# Patient Record
Sex: Male | Born: 1950 | Race: White | Hispanic: No | Marital: Single | Smoking: Never smoker
Health system: Southern US, Community
[De-identification: ages and names within clinical notes are randomized; demographics above are authoritative.]

## PROBLEM LIST (undated history)

## (undated) DIAGNOSIS — C641 Malignant neoplasm of right kidney, except renal pelvis: Secondary | ICD-10-CM

## (undated) DIAGNOSIS — M109 Gout, unspecified: Secondary | ICD-10-CM

## (undated) DIAGNOSIS — C801 Malignant (primary) neoplasm, unspecified: Secondary | ICD-10-CM

## (undated) DIAGNOSIS — E7800 Pure hypercholesterolemia, unspecified: Secondary | ICD-10-CM

## (undated) DIAGNOSIS — G243 Spasmodic torticollis: Secondary | ICD-10-CM

## (undated) DIAGNOSIS — E119 Type 2 diabetes mellitus without complications: Secondary | ICD-10-CM

## (undated) HISTORY — DX: Malignant (primary) neoplasm, unspecified: C80.1

## (undated) HISTORY — PX: BONE MARROW BIOPSY: SHX199

---

## 1996-10-29 HISTORY — PX: NEPHRECTOMY: SHX65

## 1999-12-22 ENCOUNTER — Encounter: Payer: Self-pay | Admitting: Urology

## 1999-12-22 ENCOUNTER — Encounter: Admission: RE | Admit: 1999-12-22 | Discharge: 1999-12-22 | Payer: Self-pay | Admitting: Urology

## 2000-06-12 ENCOUNTER — Encounter: Payer: Self-pay | Admitting: Urology

## 2000-06-12 ENCOUNTER — Encounter: Admission: RE | Admit: 2000-06-12 | Discharge: 2000-06-12 | Payer: Self-pay | Admitting: Urology

## 2001-02-10 ENCOUNTER — Ambulatory Visit (HOSPITAL_COMMUNITY): Admission: RE | Admit: 2001-02-10 | Discharge: 2001-02-10 | Payer: Self-pay | Admitting: Internal Medicine

## 2001-07-09 ENCOUNTER — Encounter: Payer: Self-pay | Admitting: Urology

## 2001-07-09 ENCOUNTER — Encounter: Admission: RE | Admit: 2001-07-09 | Discharge: 2001-07-09 | Payer: Self-pay | Admitting: Urology

## 2002-07-17 ENCOUNTER — Encounter: Payer: Self-pay | Admitting: Urology

## 2002-07-17 ENCOUNTER — Encounter: Admission: RE | Admit: 2002-07-17 | Discharge: 2002-07-17 | Payer: Self-pay | Admitting: Urology

## 2004-02-24 ENCOUNTER — Ambulatory Visit (HOSPITAL_COMMUNITY): Admission: RE | Admit: 2004-02-24 | Discharge: 2004-02-24 | Payer: Self-pay | Admitting: Urology

## 2004-12-26 ENCOUNTER — Ambulatory Visit (HOSPITAL_COMMUNITY): Admission: RE | Admit: 2004-12-26 | Discharge: 2004-12-26 | Payer: Self-pay | Admitting: Internal Medicine

## 2005-01-08 ENCOUNTER — Ambulatory Visit (HOSPITAL_COMMUNITY): Admission: RE | Admit: 2005-01-08 | Discharge: 2005-01-08 | Payer: Self-pay | Admitting: General Surgery

## 2005-10-29 HISTORY — PX: CHOLECYSTECTOMY: SHX55

## 2006-04-04 ENCOUNTER — Ambulatory Visit (HOSPITAL_COMMUNITY): Admission: RE | Admit: 2006-04-04 | Discharge: 2006-04-04 | Payer: Self-pay | Admitting: Internal Medicine

## 2006-04-16 ENCOUNTER — Encounter (HOSPITAL_COMMUNITY): Admission: RE | Admit: 2006-04-16 | Discharge: 2006-05-16 | Payer: Self-pay | Admitting: Internal Medicine

## 2006-05-07 ENCOUNTER — Ambulatory Visit (HOSPITAL_COMMUNITY): Admission: RE | Admit: 2006-05-07 | Discharge: 2006-05-07 | Payer: Self-pay | Admitting: Internal Medicine

## 2006-05-23 ENCOUNTER — Encounter (HOSPITAL_COMMUNITY): Admission: RE | Admit: 2006-05-23 | Discharge: 2006-06-22 | Payer: Self-pay | Admitting: Neurosurgery

## 2008-12-30 ENCOUNTER — Encounter: Admission: RE | Admit: 2008-12-30 | Discharge: 2008-12-30 | Payer: Self-pay | Admitting: Occupational Medicine

## 2010-11-10 ENCOUNTER — Encounter (INDEPENDENT_AMBULATORY_CARE_PROVIDER_SITE_OTHER): Payer: Self-pay

## 2010-11-13 ENCOUNTER — Ambulatory Visit: Admit: 2010-11-13 | Payer: Self-pay | Admitting: Gastroenterology

## 2010-11-27 ENCOUNTER — Encounter: Payer: Self-pay | Admitting: Internal Medicine

## 2010-11-30 NOTE — Letter (Signed)
Summary: Recall, Screening Colonoscopy Only  [facility]  [address]   [place], [place] [address]   Phone: [phone]  Fax: [phone]    November 10, 2010  Tom Marshall [address] [place], [place]  [address] April 18, 1951   Dear [name],   Our records indicate it is time to schedule your colonoscopy.    Please call our office at [phone] and ask for the nurse.   Thank you,  Burnadette Peter, LPN [place] Merl, [facility] Ph: [phone]   Fax: [phone]

## 2010-12-06 ENCOUNTER — Encounter: Payer: Self-pay | Admitting: Internal Medicine

## 2010-12-06 ENCOUNTER — Ambulatory Visit (HOSPITAL_COMMUNITY)
Admission: RE | Admit: 2010-12-06 | Discharge: 2010-12-06 | Disposition: A | Discharge status: Home / Self Care | Payer: 59 | Source: Ambulatory Visit | Attending: Internal Medicine | Admitting: Internal Medicine

## 2010-12-06 DIAGNOSIS — Z09 Encounter for follow-up examination after completed treatment for conditions other than malignant neoplasm: Secondary | ICD-10-CM

## 2010-12-06 DIAGNOSIS — Z860100 Personal history of colon polyps, unspecified: Secondary | ICD-10-CM | POA: Insufficient documentation

## 2010-12-06 DIAGNOSIS — E785 Hyperlipidemia, unspecified: Secondary | ICD-10-CM | POA: Insufficient documentation

## 2010-12-06 DIAGNOSIS — Z79899 Other long term (current) drug therapy: Secondary | ICD-10-CM | POA: Insufficient documentation

## 2010-12-06 DIAGNOSIS — E119 Type 2 diabetes mellitus without complications: Secondary | ICD-10-CM | POA: Insufficient documentation

## 2010-12-06 DIAGNOSIS — Z1211 Encounter for screening for malignant neoplasm of colon: Secondary | ICD-10-CM | POA: Insufficient documentation

## 2010-12-06 LAB — GLUCOSE, CAPILLARY: Glucose-Capillary: 149 mg/dL — ABNORMAL HIGH (ref 70–99)

## 2010-12-06 NOTE — Letter (Signed)
Summary: TRIAGE ORDER  TRIAGE ORDER   Imported By: Sofie Rower 11/27/2010 15:36:40  _____________________________________________________________________  External Attachment:    Type:   Image     Comment:   External Document

## 2010-12-19 NOTE — Op Note (Signed)
  NAME:  Tom Marshall, Tom Marshall NO.:  [id number]  MEDICAL RECORD NO.:  [id number]           PATIENT TYPE:  O  LOCATION:  DAYP                          FACILITY:  [facility]  PHYSICIAN:  R. Garfield Cornea, M.D. DATE OF BIRTH:  [birth year]  DATE OF PROCEDURE:  12/06/2010 DATE OF DISCHARGE:                              OPERATIVE REPORT   INDICATIONS FOR PROCEDURE:  A 60 year old gentleman comes for screening colonoscopy.  His last colonoscopy was in 2002.  That was done by me. He was found to have a hyperplastic polyp at that time.  No family history of colon polyps or colon cancer.  No GI symptoms currently. Colonoscopy is now being done as standard screening maneuver.  Risks, benefits, limitations, alternatives, and imponderables have been discussed, questions answered.  Please see the documentation in the medical record.  PROCEDURE NOTE:  O2 saturation, blood pressure, pulse, and respirations were monitored throughout the entire procedure.  CONSCIOUS SEDATION:  Versed 4 mg IV, Demerol 75 mg IV in divided doses.  INSTRUMENT:  Pentax video chip system.  FINDINGS:  Digital rectal exam revealed no abnormalities.  Endoscopic findings:  Prep was adequate.  Colon:  Colonic mucosa was surveyed from the rectosigmoid junction through the left, transverse, right colon to the appendiceal orifice, ileocecal valve/cecum.  These structures were well seen and photographed for the record.  From this level, scope was slowly and cautiously withdrawn.  All previously seen mucosal surfaces were once again surveyed.  The patient had somewhat of a long tortuous colon, otherwise colonic mucosa appeared normal.  Scope was pulled down to the rectum where thorough examination of the rectal mucosa including retroflexed view of the anal verge demonstrated only anal papilla.  The patient tolerated the procedure well.  Cecal withdrawal time 10 minutes.  IMPRESSION:  Anal papilla, otherwise normal  rectum.  Long, somewhat redundant but otherwise normal-appearing colon.  RECOMMENDATIONS:  Repeat screening colonoscopy in 10 years.     Bridgette Habermann, M.D.     RMR/MEDQ  D:  12/06/2010  T:  12/06/2010  Job:  [id number]  cc:   Paula Compton. Willey Blade, MD Fax: [phone]  Electronically Signed by Jannette Spanner M.D. on 12/19/2010 02:33:11 PM

## 2011-03-16 NOTE — H&P (Signed)
NAME:  Tom Marshall, Tom Marshall NO.:  [id number]   MEDICAL RECORD NO.:  [id number]          PATIENT TYPE:  AMB   LOCATION:                                 FACILITY:   PHYSICIAN:  Jamesetta So, M.D.  DATE OF BIRTH:  September 13, 1951   DATE OF ADMISSION:  01/08/2005  DATE OF DISCHARGE:  LH                                HISTORY & PHYSICAL   CHIEF COMPLAINT:  1.  Biliary colic.  2.  Cholelithiasis.   HISTORY OF PRESENT ILLNESS:  The patient is a 61 year old white male who is  referred for evaluation and treatment of biliary colic secondary to  cholelithiasis.  He has been having intermittent episodes of right upper  quadrant abdominal pain with radiation to the right flank, nausea and  bloating for several weeks.  No fatty food intolerance is noted.  No fever,  chills or jaundice have been noted.   PAST MEDICAL HISTORY:  Gout.   PAST SURGICAL HISTORY:  Right nephrectomy for cancer.   CURRENT MEDICATIONS:  Allopurinol 300 mg p.o. daily.   ALLERGIES:  No known drug allergies.   REVIEW OF SYSTEMS:  Noncontributory.   PHYSICAL EXAMINATION:  GENERAL APPEARANCE:  The patient is a well-developed,  well-nourished, white male in no acute distress.  VITAL SIGNS:  He is afebrile and vital signs are stable.  HEENT:  No scleral icterus.  LUNGS:  Clear to auscultation with equal breath sounds bilaterally.  HEART:  Regular rate and rhythm without S3, S4 or murmurs.  ABDOMEN:  Soft with slight tenderness down the right upper quadrant to  palpation.  A large right subcostal surgical scar is noted.  No  hepatosplenomegaly, masses or hernias are present.   LABORATORY DATA:  Ultrasound of the gallbladder reveals a single stone  within the gallbladder lumen.  A normal common bile duct is noted.   IMPRESSION:  1.  Biliary colic.  2.  Cholelithiasis.   PLAN:  The patient is scheduled for laparoscopic cholecystectomy on January 08, 2005.  The risks and benefits of the procedure,  including bleeding,  infection, hepatobiliary injury and the possibility of an open procedure  were fully explained to the patient, who gave informed consent.      MAJ/MEDQ  D:  01/04/2005  T:  01/04/2005  Job:  [id number]   cc:   [name]  Fax: [place] Willey Blade, [place]  [place] [address]  Fax: [phone]

## 2011-03-16 NOTE — Op Note (Signed)
NAME:  Tom Marshall, Tom Marshall NO.:  [id number]   MEDICAL RECORD NO.:  [id number]          PATIENT TYPE:  AMB   LOCATION:  DAY                           FACILITY:  [facility]   PHYSICIAN:  Jamesetta So, M.D.  DATE OF BIRTH:  [birth year]   DATE OF PROCEDURE:  01/08/2005  DATE OF DISCHARGE:                                 OPERATIVE REPORT   PREOPERATIVE DIAGNOSIS:  Cholecystitis, cholelithiasis.   POSTOPERATIVE DIAGNOSIS:  Cholecystitis, cholelithiasis.   PROCEDURE:  Laparoscopic cholecystectomy.   SURGEON:  Jamesetta So, M.D.   ASSISTANT:  Reita Cliche. Marnette Burgess, M.D.   ANESTHESIA:  General endotracheal.   INDICATIONS:  The patient is a 60 year old white male status post a right  nephrectomy in the past, who now presents with cholecystitis secondary to  cholelithiasis.  The risks and benefits of the procedure, including  bleeding, infection, hepatobiliary, and the possibility of an open procedure  were fully explained to the patient, who gave informed consent.   PROCEDURE NOTE:  The patient was placed in the supine position.  After  induction of general endotracheal anesthesia, the abdomen was prepped and  draped using the usual sterile technique with Betadine.  Surgical site  confirmation was performed.   A supraumbilical incision was made down to the fascia.  A Veress needle was  introduced into the abdominal cavity and confirmation was performed using  the saline drop test.  The abdomen was then insufflated to 16 mmHg pressure.  An 11 mm trocar was introduced into the abdominal cavity under direct  visualization without difficulty. An additional 11-mm trocar was placed in  the epigastric region and 5-mm trocars placed in the right upper quadrant  and right flank regions.  Significant amount of adhesions of omentum were  noted to the right subcostal incision.  These were freed away from the  abdominal wall using the harmonic scalpel.  The liver was then inspected  and  noted to normal limits.  The gallbladder was retracted superior and laterally.  The dissection was  begun from the top of the gallbladder down to the infundibulum.  The  infundibulum was fully visualized.  The cystic duct was first fully  visualized.  The juncture of the cystic duct with the infundibulum was fully  visualized. Endoclips were placed proximally and distally on the cystic duct  and the cystic duct was divided.  This was likewise done to the cystic  artery.  The gallbladder then freed away from the gallbladder fossa using  Bovie electrocautery.  The gallbladder was delivered through the epigastric  trocar site using EndoCatch bag.  The gallbladder fossa was inspected and no  abnormal bleeding or bile leakage was noted.  Surgicel was placed in the  gallbladder fossa.  All fluid and air were then evacuated from the abdominal  cavity prior to removal of the trocars.   All wounds were irrigated with normal saline.  All wounds were injected with  0.5% Sensorcaine.  The supraumbilical fascia as well as epigastric fascia  were reapproximated using 0 Vicryl interrupted sutures.  All skin incisions  were closed using  staples.  Betadine ointment and dry sterile dressings were  applied.   All tape and needle counts were correct and the procedure. The patient was  extubated in the operating room went back to the recovery room awake in  stable condition.   COMPLICATIONS:  None.   SPECIMEN:  Gallbladder stones.   BLOOD LOSS:  Minimal.      MAJ/MEDQ  D:  01/08/2005  T:  01/08/2005  Job:  [id number]   cc:   Paula Compton. Willey Blade, [place]  [place] [address]  Fax: [phone]

## 2015-07-18 DIAGNOSIS — Z125 Encounter for screening for malignant neoplasm of prostate: Secondary | ICD-10-CM | POA: Diagnosis not present

## 2015-07-18 DIAGNOSIS — I10 Essential (primary) hypertension: Secondary | ICD-10-CM | POA: Diagnosis not present

## 2015-07-18 DIAGNOSIS — E119 Type 2 diabetes mellitus without complications: Secondary | ICD-10-CM | POA: Diagnosis not present

## 2015-07-18 DIAGNOSIS — Z79899 Other long term (current) drug therapy: Secondary | ICD-10-CM | POA: Diagnosis not present

## 2015-07-18 DIAGNOSIS — M109 Gout, unspecified: Secondary | ICD-10-CM | POA: Diagnosis not present

## 2015-07-25 DIAGNOSIS — E1129 Type 2 diabetes mellitus with other diabetic kidney complication: Secondary | ICD-10-CM | POA: Diagnosis not present

## 2015-07-25 DIAGNOSIS — Z6837 Body mass index (BMI) 37.0-37.9, adult: Secondary | ICD-10-CM | POA: Diagnosis not present

## 2015-07-25 DIAGNOSIS — Z23 Encounter for immunization: Secondary | ICD-10-CM | POA: Diagnosis not present

## 2015-07-25 DIAGNOSIS — E785 Hyperlipidemia, unspecified: Secondary | ICD-10-CM | POA: Diagnosis not present

## 2015-07-25 DIAGNOSIS — M109 Gout, unspecified: Secondary | ICD-10-CM | POA: Diagnosis not present

## 2015-11-15 DIAGNOSIS — E119 Type 2 diabetes mellitus without complications: Secondary | ICD-10-CM | POA: Diagnosis not present

## 2015-11-21 DIAGNOSIS — Z23 Encounter for immunization: Secondary | ICD-10-CM | POA: Diagnosis not present

## 2015-11-21 DIAGNOSIS — Z6837 Body mass index (BMI) 37.0-37.9, adult: Secondary | ICD-10-CM | POA: Diagnosis not present

## 2015-11-21 DIAGNOSIS — E1129 Type 2 diabetes mellitus with other diabetic kidney complication: Secondary | ICD-10-CM | POA: Diagnosis not present

## 2015-11-21 DIAGNOSIS — I10 Essential (primary) hypertension: Secondary | ICD-10-CM | POA: Diagnosis not present

## 2016-04-16 DIAGNOSIS — Z79899 Other long term (current) drug therapy: Secondary | ICD-10-CM | POA: Diagnosis not present

## 2016-04-16 DIAGNOSIS — E785 Hyperlipidemia, unspecified: Secondary | ICD-10-CM | POA: Diagnosis not present

## 2016-04-16 DIAGNOSIS — I10 Essential (primary) hypertension: Secondary | ICD-10-CM | POA: Diagnosis not present

## 2016-04-16 DIAGNOSIS — Z125 Encounter for screening for malignant neoplasm of prostate: Secondary | ICD-10-CM | POA: Diagnosis not present

## 2016-04-16 DIAGNOSIS — E119 Type 2 diabetes mellitus without complications: Secondary | ICD-10-CM | POA: Diagnosis not present

## 2016-04-20 DIAGNOSIS — E785 Hyperlipidemia, unspecified: Secondary | ICD-10-CM | POA: Diagnosis not present

## 2016-04-20 DIAGNOSIS — Z6837 Body mass index (BMI) 37.0-37.9, adult: Secondary | ICD-10-CM | POA: Diagnosis not present

## 2016-04-20 DIAGNOSIS — E1129 Type 2 diabetes mellitus with other diabetic kidney complication: Secondary | ICD-10-CM | POA: Diagnosis not present

## 2016-04-20 DIAGNOSIS — I10 Essential (primary) hypertension: Secondary | ICD-10-CM | POA: Diagnosis not present

## 2016-08-28 DIAGNOSIS — E119 Type 2 diabetes mellitus without complications: Secondary | ICD-10-CM | POA: Diagnosis not present

## 2016-09-04 DIAGNOSIS — I10 Essential (primary) hypertension: Secondary | ICD-10-CM | POA: Diagnosis not present

## 2016-09-04 DIAGNOSIS — E1129 Type 2 diabetes mellitus with other diabetic kidney complication: Secondary | ICD-10-CM | POA: Diagnosis not present

## 2017-01-02 DIAGNOSIS — E119 Type 2 diabetes mellitus without complications: Secondary | ICD-10-CM | POA: Diagnosis not present

## 2017-01-10 DIAGNOSIS — Z6837 Body mass index (BMI) 37.0-37.9, adult: Secondary | ICD-10-CM | POA: Diagnosis not present

## 2017-01-10 DIAGNOSIS — Z23 Encounter for immunization: Secondary | ICD-10-CM | POA: Diagnosis not present

## 2017-01-10 DIAGNOSIS — I10 Essential (primary) hypertension: Secondary | ICD-10-CM | POA: Diagnosis not present

## 2017-01-10 DIAGNOSIS — E1129 Type 2 diabetes mellitus with other diabetic kidney complication: Secondary | ICD-10-CM | POA: Diagnosis not present

## 2017-03-06 DIAGNOSIS — E119 Type 2 diabetes mellitus without complications: Secondary | ICD-10-CM | POA: Diagnosis not present

## 2017-05-02 DIAGNOSIS — M109 Gout, unspecified: Secondary | ICD-10-CM | POA: Diagnosis not present

## 2017-05-02 DIAGNOSIS — E119 Type 2 diabetes mellitus without complications: Secondary | ICD-10-CM | POA: Diagnosis not present

## 2017-05-02 DIAGNOSIS — C649 Malignant neoplasm of unspecified kidney, except renal pelvis: Secondary | ICD-10-CM | POA: Diagnosis not present

## 2017-05-02 DIAGNOSIS — E785 Hyperlipidemia, unspecified: Secondary | ICD-10-CM | POA: Diagnosis not present

## 2017-05-02 DIAGNOSIS — Z125 Encounter for screening for malignant neoplasm of prostate: Secondary | ICD-10-CM | POA: Diagnosis not present

## 2017-05-02 DIAGNOSIS — I10 Essential (primary) hypertension: Secondary | ICD-10-CM | POA: Diagnosis not present

## 2017-05-02 DIAGNOSIS — Z79899 Other long term (current) drug therapy: Secondary | ICD-10-CM | POA: Diagnosis not present

## 2017-05-06 DIAGNOSIS — I10 Essential (primary) hypertension: Secondary | ICD-10-CM | POA: Diagnosis not present

## 2017-05-06 DIAGNOSIS — E785 Hyperlipidemia, unspecified: Secondary | ICD-10-CM | POA: Diagnosis not present

## 2017-05-06 DIAGNOSIS — E1129 Type 2 diabetes mellitus with other diabetic kidney complication: Secondary | ICD-10-CM | POA: Diagnosis not present

## 2017-05-06 DIAGNOSIS — Z6836 Body mass index (BMI) 36.0-36.9, adult: Secondary | ICD-10-CM | POA: Diagnosis not present

## 2017-06-03 DIAGNOSIS — M542 Cervicalgia: Secondary | ICD-10-CM | POA: Diagnosis not present

## 2017-06-13 ENCOUNTER — Ambulatory Visit (HOSPITAL_COMMUNITY): Payer: Medicare Other | Attending: Internal Medicine | Admitting: Physical Therapy

## 2017-06-13 ENCOUNTER — Encounter (HOSPITAL_COMMUNITY): Payer: Self-pay | Admitting: Physical Therapy

## 2017-06-13 DIAGNOSIS — R293 Abnormal posture: Secondary | ICD-10-CM | POA: Insufficient documentation

## 2017-06-13 DIAGNOSIS — M5412 Radiculopathy, cervical region: Secondary | ICD-10-CM | POA: Diagnosis not present

## 2017-06-13 DIAGNOSIS — M62830 Muscle spasm of back: Secondary | ICD-10-CM

## 2017-06-13 NOTE — Therapy (Signed)
[place], [place], [address] Phone: [phone]   Fax:  [phone]  Physical Therapy Evaluation  Patient Details  Name: Tom Marshall MRN: [id number] Date of Birth: Aug 09, 1951 Referring Provider: Asencion Noble, MD   Encounter Date: 06/13/2017      PT End of Session - 06/13/17 1433    Visit Number 1   Number of Visits 9   Date for PT Re-Evaluation 07/11/17   Authorization Type Medicare   Authorization Time Period 06/13/17 to 07/14/17   PT Start Time 1350   PT Stop Time 1428   PT Time Calculation (min) 38 min   Activity Tolerance Patient tolerated treatment well;No increased pain   Behavior During Therapy [facility] for tasks assessed/performed      History reviewed. No pertinent past medical history.  History reviewed. No pertinent surgical history.  There were no vitals filed for this visit.       Subjective Assessment - 06/13/17 1351    Subjective Pt reports having neck pain onset about 3 weeks ago when he was moving some heavy objects throughout the day. He went to see Dr. Willey Blade who referred him to PT and gave him pain medication. He noted his pain initially in the Lt side of neck and down the arm, but this has slowly improved and now is only on the Rt side from his forearm up towards the neck.    Pertinent History DM2, HLD   Limitations Other (comment)  getting up and turning his head around    How long can you sit comfortably? unlimited    How long can you stand comfortably? unlimited    How long can you walk comfortably? unsure, pt stopped doing this once his neck pain started    Diagnostic tests none recently    Patient Stated Goals improve pain    Currently in Pain? No/denies            [facility] PT Assessment - 06/13/17 0001      Assessment   Medical Diagnosis Neck pain   Referring Provider Asencion Noble, MD    Onset Date/Surgical Date --  ~3 weeks ago   Hand Dominance Left   Next MD Visit 06/17/17    Prior Therapy pt reports having PT and traction ~10 years ago which improved his symptoms.      Balance Screen   Has the patient fallen in the past 6 months No   Has the patient had a decrease in activity level because of a fear of falling?  No   Is the patient reluctant to leave their home because of a fear of falling?  No     Home Ecologist residence     Prior Function   Level of Independence Independent     Cognition   Overall Cognitive Status Within Functional Limits for tasks assessed     Observation/Other Assessments   Focus on Therapeutic Outcomes (FOTO)  58% limited     Sensation   Light Touch Appears Intact     Posture/Postural Control   Posture Comments Sitting slouched in his chair, slight Rt cervical tilt, excessive forward head and rounded shoulders      ROM / Strength   AROM / PROM / Strength AROM;Strength     AROM   Overall AROM Comments PROM WNL and pain free   AROM Assessment Site Cervical   Cervical Flexion full    Cervical Extension 20  Cervical - Right Side Bend 20  Tightness on the Rt    Cervical - Left Side Bend 40   Cervical - Right Rotation 65  pain Rt shoulder    Cervical - Left Rotation 50  Pt unable to complete with Rt lateral tilt      Strength   Strength Assessment Site Shoulder;Elbow   Right/Left Shoulder Right;Left   Right Shoulder Flexion 5/5   Right Shoulder ABduction 4/5  (+) pain    Right Shoulder Internal Rotation 5/5   Right Shoulder External Rotation 5/5   Left Shoulder Flexion 5/5   Left Shoulder ABduction 5/5   Left Shoulder Internal Rotation 4+/5   Left Shoulder External Rotation 5/5   Right/Left Elbow Right;Left   Right Elbow Flexion 5/5   Right Elbow Extension 5/5   Left Elbow Flexion 5/5   Left Elbow Extension 5/5     Palpation   Palpation comment Rt upper trap, Rt sub-occipital musculature            Objective measurements completed on examination: See above findings.           [place] Adult PT Treatment/Exercise - 06/13/17 0001      Exercises   Exercises Neck     Neck Exercises: Supine   Neck Retraction 10 reps   Neck Retraction Limitations 3 sec hold, heavy cuing initially      Neck Exercises: Stretches   Other Neck Stretches cervical rotation stretch 1x10 sec each with towel, Pt unable to complete correctly despite heavy cueing                 PT Education - 06/13/17 1431    Education provided Yes   Education Details eval findings/POC; implemented and reviewed HEP   Person(s) Educated Patient   Methods Explanation;Verbal cues;Tactile cues;Handout   Comprehension Verbalized understanding;Returned demonstration          PT Short Term Goals - 06/13/17 1515      PT SHORT TERM GOAL #1   Title Pt will demo consistency and independence with her HEP to improve cervical ROM and pain    Time 2   Period Weeks   Status New   Target Date 06/27/17           PT Long Term Goals - 06/13/17 1516      PT LONG TERM GOAL #1   Title Pt will demo proper log roll mechanics during his session, without cuing from therapist, to decrease strain to his neck musculature.    Time 4   Status New   Target Date 07/11/17     PT LONG TERM GOAL #2   Title Pt will demo improved cervical AROM to within 5 deg of the Lt and Rt, to improve his ability to turn his head while driving without pain.    Time 4   Period Weeks   Status New     PT LONG TERM GOAL #3   Title Pt will demo improved cervical mobility and deep neck flexor strength evident by his ability to complete upright chin tucks, x10 reps, without cuing from therapist.    Time 4   Period Weeks   Status New     PT LONG TERM GOAL #4   Title Pt will report atleast 50% improvement in his symptoms from the start of PT, to increase his activity tolerance and quality of life.    Time 4   Period Weeks   Status New  Plan - June 30, 2017 1438    Clinical Impression Statement  Pt is a 66 y.o M referred to OPPT with complaints of neck pain onset ~3 weeks ago after doing alot of lifting around his home. Symptoms began initially on the Lt, however pt noted complains of symptoms on the Rt of his neck and down to the elbow. Pt had difficulty describing the nature and intensity of his symptoms, but he does demonstrate limitations in active cervical ROM, decreased strength in the RUE, poor postural awareness, and increased muscle spasm along the Rt upper trap/sub-occipital region. He denies any numbness or tingling at this time and has reportedly had good results with traction treatment in the past. He would benefit from skilled PT intervention to address his impairments listed above, to improve his daily participation and quality of life. HEP was implemented and reviewed, with pt needing heavy cues for proper technique. He verbalized agreement with the proposed PT POC and frequency.    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   Clinical Presentation Stable   Clinical Presentation due to: ROM, strength and activity tolerance   Clinical Decision Making Low   Rehab Potential Good   PT Frequency 2x / week   PT Duration 4 weeks   PT Treatment/Interventions ADLs/Self Care Home Management   PT Next Visit Plan STM, gentle cervical CPAs; supine chin tucks and cervical AROM; educate pt on proper log roll technique   PT Home Exercise Plan supine cervical rotation, supine chin tucks    Consulted and Agree with Plan of Care Patient      Patient will benefit from skilled therapeutic intervention in order to improve the following deficits and impairments:  Decreased activity tolerance, Decreased range of motion, Impaired flexibility, Hypomobility, Decreased strength, Increased muscle spasms, Postural dysfunction, Pain, Improper body mechanics, Decreased mobility  Visit Diagnosis: Radiculopathy,  cervical region  Muscle spasm of back  Abnormal posture      G-Codes - 06/30/2017 1444    Functional Assessment Tool Used (Outpatient Only) FOTO: 58% limited    Functional Limitation Other PT primary   Other PT Primary Current Status (Z5638) At least 40 percent but less than 60 percent impaired, limited or restricted   Other PT Primary Goal Status (V5643) At least 20 percent but less than 40 percent impaired, limited or restricted       Problem List There are no active problems to display for this patient.   3:37 PM,06-30-17 Elly Modena PT, DPT [name] [place] [address] [place], [place], [address] Phone: [phone]   Fax:  [phone]  Name: Tom Marshall MRN: [id number] Date of Birth: 1950/12/09

## 2017-06-17 ENCOUNTER — Ambulatory Visit (HOSPITAL_COMMUNITY): Payer: Medicare Other | Admitting: Physical Therapy

## 2017-06-17 DIAGNOSIS — M62830 Muscle spasm of back: Secondary | ICD-10-CM

## 2017-06-17 DIAGNOSIS — M5412 Radiculopathy, cervical region: Secondary | ICD-10-CM

## 2017-06-17 DIAGNOSIS — R293 Abnormal posture: Secondary | ICD-10-CM

## 2017-06-17 NOTE — Therapy (Signed)
[place], [place], [address] Phone: [phone]   Fax:  [phone]  Physical Therapy Treatment  Patient Details  Name: Tom Marshall MRN: [id number] Date of Birth: 1951-08-24 Referring Provider: Asencion Noble, MD   Encounter Date: 06/17/2017      PT End of Session - 06/17/17 1507    Visit Number 2   Number of Visits 9   Date for PT Re-Evaluation 07/11/17   Authorization Type Medicare   Authorization Time Period 06/13/17 to 07/14/17   PT Start Time 1431   PT Stop Time 1511   PT Time Calculation (min) 40 min   Activity Tolerance Patient tolerated treatment well;No increased pain   Behavior During Therapy [facility] for tasks assessed/performed      No past medical history on file.  No past surgical history on file.  There were no vitals filed for this visit.      Subjective Assessment - 06/17/17 1435    Subjective Pt reports that his neck is doing ok at the moment. He has been working on his HEP, reporting no concerns with these exercises.   Pertinent History DM2, HLD   Limitations Other (comment)  getting up and turning his head around    How long can you sit comfortably? unlimited    How long can you stand comfortably? unlimited    How long can you walk comfortably? unsure, pt stopped doing this once his neck pain started    Diagnostic tests none recently    Patient Stated Goals improve pain    Currently in Pain? No/denies                         [facility] Adult PT Treatment/Exercise - 06/17/17 0001      Neck Exercises: Seated   Other Seated Exercise thoracic rotation Rt x10 reps with therapist over pressure.    Other Seated Exercise seated scap retraction with heavy cuing for proper technique x15 reps.      Neck Exercises: Supine   Neck Retraction 10 reps   Neck Retraction Limitations 3 sec hold.      Manual Therapy   Manual Therapy Soft tissue mobilization;Joint mobilization   Manual  therapy comments separate rest of session   Joint Mobilization Grade III cervical CPAs x2 bouts, Grade III/IV thoracic CPAs   Soft tissue mobilization STM cervical paraspinals, B upper trap region     Neck Exercises: Stretches   Chest Stretch 30 seconds;3 reps;Other (comment)  doorway                PT Education - 06/17/17 1506    Education provided Yes   Education Details reviewed anatomy of the cervical spine and impact posture can have on muscle activation; technique with therex    Person(s) Educated Patient   Methods Explanation;Tactile cues;Verbal cues   Comprehension Verbalized understanding;Need further instruction          PT Short Term Goals - 06/13/17 1515      PT SHORT TERM GOAL #1   Title Pt will demo consistency and independence with her HEP to improve cervical ROM and pain    Time 2   Period Weeks   Status New   Target Date 06/27/17           PT Long Term Goals - 06/13/17 1516      PT LONG TERM GOAL #1   Title Pt will demo proper log  roll mechanics during his session, without cuing from therapist, to decrease strain to his neck musculature.    Time 4   Status New   Target Date 07/11/17     PT LONG TERM GOAL #2   Title Pt will demo improved cervical AROM to within 5 deg of the Lt and Rt, to improve his ability to turn his head while driving without pain.    Time 4   Period Weeks   Status New     PT LONG TERM GOAL #3   Title Pt will demo improved cervical mobility and deep neck flexor strength evident by his ability to complete upright chin tucks, x10 reps, without cuing from therapist.    Time 4   Period Weeks   Status New     PT LONG TERM GOAL #4   Title Pt will report atleast 50% improvement in his symptoms from the start of PT, to increase his activity tolerance and quality of life.    Time 4   Period Weeks   Status New               Plan - 06/17/17 1512    Clinical Impression Statement Pt arrived today without report of  RUE symptoms. Therapist reviewed his HEP provided at initial evaluation, with pt needing several adjustments to his technique with chin tucks. Also addressed muscle and joint restrictions with manual techniques and stretching at the end of today's session. Therapist encouraged pt to utilize towel roll while at work and demonstrated proper set up. Ended without report of pain. Will continue with current POC.    Rehab Potential Good   PT Frequency 2x / week   PT Duration 4 weeks   PT Treatment/Interventions ADLs/Self Care Home Management;Traction;Moist Heat;Therapeutic exercise;Therapeutic activities;Neuromuscular re-education;Patient/family education;Dry needling;Manual techniques;Passive range of motion   PT Next Visit Plan continue with supine chin tucks and cervical AROM; follow up on use of lumbar roll at work   PT Home Exercise Plan supine cervical rotation, supine chin tucks, thoracic rotation stretch, lumbar roll    Consulted and Agree with Plan of Care Patient      Patient will benefit from skilled therapeutic intervention in order to improve the following deficits and impairments:  Decreased activity tolerance, Decreased range of motion, Impaired flexibility, Hypomobility, Decreased strength, Increased muscle spasms, Postural dysfunction, Pain, Improper body mechanics, Decreased mobility  Visit Diagnosis: Radiculopathy, cervical region  Muscle spasm of back  Abnormal posture     Problem List There are no active problems to display for this patient.  3:18 PM,06/17/17 Elly Modena PT, DPT [name] [place] [address] [place], [place], [address] Phone: [phone]   Fax:  [phone]  Name: Tom Marshall MRN: [id number] Date of Birth: 1951-03-22

## 2017-06-20 ENCOUNTER — Ambulatory Visit (HOSPITAL_COMMUNITY): Payer: Medicare Other

## 2017-06-20 DIAGNOSIS — R293 Abnormal posture: Secondary | ICD-10-CM | POA: Diagnosis not present

## 2017-06-20 DIAGNOSIS — M62830 Muscle spasm of back: Secondary | ICD-10-CM

## 2017-06-20 DIAGNOSIS — M5412 Radiculopathy, cervical region: Secondary | ICD-10-CM

## 2017-06-20 NOTE — Therapy (Signed)
[place], [place], [address] Phone: [phone]   Fax:  [phone]  Physical Therapy Treatment  Patient Details  Name: Tom Marshall MRN: [id number] Date of Birth: [birth year] Referring Provider: Asencion Noble, MD   Encounter Date: 06/20/2017      PT End of Session - 06/20/17 1358    Visit Number 3   Number of Visits 9   Date for PT Re-Evaluation 07/11/17   Authorization Type Medicare   Authorization Time Period 06/13/17 to 07/14/17   PT Start Time 1351   PT Stop Time 1435   PT Time Calculation (min) 44 min   Activity Tolerance Patient tolerated treatment well;No increased pain   Behavior During Therapy [facility] for tasks assessed/performed      No past medical history on file.  No past surgical history on file.  There were no vitals filed for this visit.      Subjective Assessment - 06/20/17 1357    Subjective Pt reports his neck is feeling good today, does c/o LBP pain scale 4/10 today.  Has been working on his HEP and feels he can move neck easier, continues to be limited Lt rotaiton.     Pertinent History DM2, HLD   Patient Stated Goals improve pain    Currently in Pain? No/denies  LBP 4/10               [facility] Adult PT Treatment/Exercise - 06/20/17 0001      Neck Exercises: Seated   Other Seated Exercise thoracic extension wiht arms crossed on chest; Bil thoracic rotation with eye following thumb for cervical     Neck Exercises: Supine   Neck Retraction 10 reps   Neck Retraction Limitations 3 sec hold.    Cervical Rotation Both;10 reps   Other Supine Exercise cervical stability (chin tuck) while completeing bent knee raise   Other Supine Exercise cervical stability (chin tucks) during BLE clam 5x each     Manual Therapy   Manual Therapy Soft tissue mobilization;Other (comment)   Manual therapy comments separate rest of session   Soft tissue mobilization STM cervical paraspinals, B upper  trap region   Other Manual Therapy Position release technique for Lt UT to improve rotation                  PT Short Term Goals - 06/13/17 1515      PT SHORT TERM GOAL #1   Title Pt will demo consistency and independence with her HEP to improve cervical ROM and pain    Time 2   Period Weeks   Status New   Target Date 06/27/17           PT Long Term Goals - 06/13/17 1516      PT LONG TERM GOAL #1   Title Pt will demo proper log roll mechanics during his session, without cuing from therapist, to decrease strain to his neck musculature.    Time 4   Status New   Target Date 07/11/17     PT LONG TERM GOAL #2   Title Pt will demo improved cervical AROM to within 5 deg of the Lt and Rt, to improve his ability to turn his head while driving without pain.    Time 4   Period Weeks   Status New     PT LONG TERM GOAL #3   Title Pt will demo improved cervical mobility and deep neck flexor strength evident  by his ability to complete upright chin tucks, x10 reps, without cuing from therapist.    Time 4   Period Weeks   Status New     PT LONG TERM GOAL #4   Title Pt will report atleast 50% improvement in his symptoms from the start of PT, to increase his activity tolerance and quality of life.    Time 4   Period Weeks   Status New               Plan - 06/20/17 1439    Clinical Impression Statement Pt arrived reporting pain free and feels his ROM is making improvements, reports difficulty with Lt cervical rotation.  Therex focus on postural strengthening and cervical mobility.  Pt requires multimodal cueing to improve cervical retraction.  Once able to achieve approriate technique wiht chin tucks added core strengthening activities to improve cervical stability.  EOS with manual soft tissue mobilization techniques to address cervical muscualture tightness, added position release technique to Lt upper traps with vast improvements with cervical rotation following.  No  reoprts of pain.  Educated importance of improving posture through out the day for proper musculature length and pain control.     Rehab Potential Good   PT Frequency 2x / week   PT Duration 4 weeks   PT Treatment/Interventions ADLs/Self Care Home Management;Traction;Moist Heat;Therapeutic exercise;Therapeutic activities;Neuromuscular re-education;Patient/family education;Dry needling;Manual techniques;Passive range of motion   PT Next Visit Plan continue with supine chin tucks and cervical AROM; follow up on use of lumbar roll at work      Patient will benefit from skilled therapeutic intervention in order to improve the following deficits and impairments:  Decreased activity tolerance, Decreased range of motion, Impaired flexibility, Hypomobility, Decreased strength, Increased muscle spasms, Postural dysfunction, Pain, Improper body mechanics, Decreased mobility  Visit Diagnosis: Radiculopathy, cervical region  Muscle spasm of back  Abnormal posture     Problem List There are no active problems to display for this patient.  [address] LPTA; [place]  Aldona Lento 06/20/2017, 2:50 PM  [place] [address] [place], [place], [address] Phone: [phone]   Fax:  [phone]  Name: Tom Marshall MRN: [id number] Date of Birth: 15-Nov-1950

## 2017-06-24 ENCOUNTER — Ambulatory Visit (HOSPITAL_COMMUNITY): Payer: Medicare Other | Admitting: Physical Therapy

## 2017-06-24 DIAGNOSIS — M5412 Radiculopathy, cervical region: Secondary | ICD-10-CM

## 2017-06-24 DIAGNOSIS — R293 Abnormal posture: Secondary | ICD-10-CM | POA: Diagnosis not present

## 2017-06-24 DIAGNOSIS — M62830 Muscle spasm of back: Secondary | ICD-10-CM | POA: Diagnosis not present

## 2017-06-24 NOTE — Therapy (Signed)
[place], [place], [address] Phone: [phone]   Fax:  [phone]  Physical Therapy Treatment  Patient Details  Name: Tom Marshall MRN: [id number] Date of Birth: 11/13/1950 Referring Provider: Asencion Noble, MD   Encounter Date: 06/24/2017      PT End of Session - 06/24/17 1356    Visit Number 4   Number of Visits 9   Date for PT Re-Evaluation 07/11/17   Authorization Type Medicare   Authorization Time Period 06/13/17 to 07/14/17   PT Start Time 1351   PT Stop Time 1425   PT Time Calculation (min) 34 min   Activity Tolerance Patient tolerated treatment well;No increased pain   Behavior During Therapy [facility] for tasks assessed/performed      No past medical history on file.  No past surgical history on file.  There were no vitals filed for this visit.                       [facility] Adult PT Treatment/Exercise - 06/24/17 0001      Neck Exercises: Seated   Other Seated Exercise thoracic rotation with over pressure x10 reps each      Manual Therapy   Manual therapy comments separate rest of session   Soft tissue mobilization STM Lt and Rt posterior shoulder      Neck Exercises: Stretches   Corner Stretch 3 reps;30 seconds  doorway    Chest Stretch 4 reps;30 seconds;Other (comment)  supine with towel roll   Other Neck Stretches cross arm stretch 3x30 sec each    Other Neck Stretches cervical rotation stretch against wall 5x10 sec each                 PT Education - 06/24/17 1427    Education provided Yes   Education Details discussed possible causes of periscapular pain and shoulder soreness related to new exercises complete prior to onset of his symptoms; reviewed stretches and encouraged pt to apply heat pad at home as needed to see if any more improvements are noticed    Person(s) Educated Patient   Methods Explanation;Tactile cues;Verbal cues   Comprehension Verbalized  understanding;Need further instruction          PT Short Term Goals - 06/13/17 1515      PT SHORT TERM GOAL #1   Title Pt will demo consistency and independence with her HEP to improve cervical ROM and pain    Time 2   Period Weeks   Status New   Target Date 06/27/17           PT Long Term Goals - 06/13/17 1516      PT LONG TERM GOAL #1   Title Pt will demo proper log roll mechanics during his session, without cuing from therapist, to decrease strain to his neck musculature.    Time 4   Status New   Target Date 07/11/17     PT LONG TERM GOAL #2   Title Pt will demo improved cervical AROM to within 5 deg of the Lt and Rt, to improve his ability to turn his head while driving without pain.    Time 4   Period Weeks   Status New     PT LONG TERM GOAL #3   Title Pt will demo improved cervical mobility and deep neck flexor strength evident by his ability to complete upright chin tucks, x10 reps, without cuing from  therapist.    Time 4   Period Weeks   Status New     PT LONG TERM GOAL #4   Title Pt will report atleast 50% improvement in his symptoms from the start of PT, to increase his activity tolerance and quality of life.    Time 4   Period Weeks   Status New               Plan - 06/24/17 1516    Clinical Impression Statement Pt is making progress towards his goals with steadily improving cervical AROM. He reports no issues with cervical pain or limitation, however he did not shoulder soreness following his last session which appears to be related to new exercises introduced. Therapist spent a majority of today's session addressing pt's complaints of muscle soreness with stretching and manual techniques. Ended session with report of improved pain and pt verbalized understanding of concepts discussed throughout the session.    Rehab Potential Good   PT Frequency 2x / week   PT Duration 4 weeks   PT Treatment/Interventions ADLs/Self Care Home  Management;Traction;Moist Heat;Therapeutic exercise;Therapeutic activities;Neuromuscular re-education;Patient/family education;Dry needling;Manual techniques;Passive range of motion   PT Next Visit Plan continue with supine chin tucks and cervical AROM; follow up with B shoulder soreness improvements    PT Home Exercise Plan supine cervical rotation, supine chin tucks, thoracic rotation stretch, lumbar roll    Consulted and Agree with Plan of Care Patient      Patient will benefit from skilled therapeutic intervention in order to improve the following deficits and impairments:  Decreased activity tolerance, Decreased range of motion, Impaired flexibility, Hypomobility, Decreased strength, Increased muscle spasms, Postural dysfunction, Pain, Improper body mechanics, Decreased mobility  Visit Diagnosis: Radiculopathy, cervical region  Muscle spasm of back  Abnormal posture     Problem List There are no active problems to display for this patient.   3:22 PM,06/24/17 Elly Modena PT, DPT [name] [place] [address] [place], [place], [address] Phone: [phone]   Fax:  [phone]  Name: Tom Marshall MRN: [id number] Date of Birth: 06-21-1951

## 2017-06-27 ENCOUNTER — Ambulatory Visit (HOSPITAL_COMMUNITY): Payer: Medicare Other | Admitting: Physical Therapy

## 2017-06-27 DIAGNOSIS — M5412 Radiculopathy, cervical region: Secondary | ICD-10-CM | POA: Diagnosis not present

## 2017-06-27 DIAGNOSIS — M62830 Muscle spasm of back: Secondary | ICD-10-CM | POA: Diagnosis not present

## 2017-06-27 DIAGNOSIS — R293 Abnormal posture: Secondary | ICD-10-CM

## 2017-06-27 NOTE — Therapy (Signed)
[place], [place], [address] Phone: [phone]   Fax:  [phone]  Physical Therapy Treatment  Patient Details  Name: Tom Marshall MRN: [id number] Date of Birth: 05-Nov-1950 Referring Provider: Asencion Noble, MD   Encounter Date: 06/27/2017      PT End of Session - 06/27/17 0086    Visit Number 5   Number of Visits 9   Date for PT Re-Evaluation 07/11/17   Authorization Type Medicare   Authorization Time Period 06/13/17 to 07/14/17   PT Start Time 1433   PT Stop Time 1513   PT Time Calculation (min) 40 min   Activity Tolerance Patient tolerated treatment well;No increased pain   Behavior During Therapy [facility] for tasks assessed/performed      No past medical history on file.  No past surgical history on file.  There were no vitals filed for this visit.      Subjective Assessment - 06/27/17 1434    Subjective Pt reports that things continue to go well. He has no concerns other than his inability to lift his LUE over his head. He continues to work on his exercises at home. He has no pain currently until he tries to lift his Lt arm.    Pertinent History DM2, HLD   Patient Stated Goals improve pain    Currently in Pain? No/denies            [facility] PT Assessment - 06/27/17 0001      Special Tests    Special Tests Rotator Cuff Impingement   Rotator Cuff Impingment tests Empty Can test;Full Can test;Belly Press     Belly Press   Findings Positive   Side Left     Empty Can test   Findings Positive   Side Left   Comment (+) pain increase compared to full can      Full Can test   Findings Positive   Side Left               [facility] Adult PT Treatment/Exercise - 06/27/17 0001      Neck Exercises: Seated   Neck Retraction 15 reps;3 secs   Other Seated Exercise seated shoulder flexion AAROM with cane x15 reps, x15 reps without cane and therapist providing scapular assistance      Neck  Exercises: Supine   UE D2 Limitations chin tuck, red TB each UE x10 reps    Other Supine Exercise BUE elevation with 2# bar x10 reps      Manual Therapy   Joint Mobilization Lt scapula upward rotation mobilization during active and passive shoulder elevation x10 reps each                 PT Education - 06/27/17 1528    Education provided Yes   Education Details technique with therex; discussed possible limitations in the shoulder rather than from the neck and updated HEP   Person(s) Educated Patient   Methods Explanation;Tactile cues;Verbal cues   Comprehension Verbalized understanding;Returned demonstration          PT Short Term Goals - 06/13/17 1515      PT SHORT TERM GOAL #1   Title Pt will demo consistency and independence with her HEP to improve cervical ROM and pain    Time 2   Period Weeks   Status New   Target Date 06/27/17           PT Long Term Goals - 06/13/17 1516  PT LONG TERM GOAL #1   Title Pt will demo proper log roll mechanics during his session, without cuing from therapist, to decrease strain to his neck musculature.    Time 4   Status New   Target Date 07/11/17     PT LONG TERM GOAL #2   Title Pt will demo improved cervical AROM to within 5 deg of the Lt and Rt, to improve his ability to turn his head while driving without pain.    Time 4   Period Weeks   Status New     PT LONG TERM GOAL #3   Title Pt will demo improved cervical mobility and deep neck flexor strength evident by his ability to complete upright chin tucks, x10 reps, without cuing from therapist.    Time 4   Period Weeks   Status New     PT LONG TERM GOAL #4   Title Pt will report atleast 50% improvement in his symptoms from the start of PT, to increase his activity tolerance and quality of life.    Time 4   Period Weeks   Status New               Plan - 06/27/17 1531    Clinical Impression Statement Continued this session with activity to promote  cervical neuromuscular control and shoulder mobility. Pt presenting with resolution of mid thoracic and Rt shoulder soreness following his last session however there continues to be limitations in Lt shoulder AROM. Pt reporting no pain at rest, only with AROM which prompted therapist to further assess shoulder symptoms. Noting weakness and pain with should flexion and IR, tenderness along the greater tubercle, and pain with sleeping on the Lt shoulder, indicating possible rotator cuff involvement. Pt would continue to benefit from treatment of both the neck and shoulder region to improve mobility and postural control, however we will continue to monitor and assess shoulder complaints as needed. He verbalized understanding at this time.    Rehab Potential Good   PT Frequency 2x / week   PT Duration 4 weeks   PT Treatment/Interventions ADLs/Self Care Home Management;Traction;Moist Heat;Therapeutic exercise;Therapeutic activities;Neuromuscular re-education;Patient/family education;Dry needling;Manual techniques;Passive range of motion   PT Next Visit Plan continue to work on postural strengthening; pec minor stretch/manual; Lt scapular mobility   PT Home Exercise Plan supine cervical rotation, supine chin tucks, thoracic rotation stretch, lumbar roll, supine shoulder flexion with SPC   Consulted and Agree with Plan of Care Patient      Patient will benefit from skilled therapeutic intervention in order to improve the following deficits and impairments:  Decreased activity tolerance, Decreased range of motion, Impaired flexibility, Hypomobility, Decreased strength, Increased muscle spasms, Postural dysfunction, Pain, Improper body mechanics, Decreased mobility  Visit Diagnosis: Radiculopathy, cervical region  Muscle spasm of back  Abnormal posture     Problem List There are no active problems to display for this patient.   3:40 PM,06/27/17 Elly Modena PT, DPT [name] [place] [address] [place], [place], [address] Phone: [phone]   Fax:  [phone]  Name: Tom Marshall MRN: [id number] Date of Birth: 1951-03-03

## 2017-07-02 ENCOUNTER — Encounter (HOSPITAL_COMMUNITY): Payer: Self-pay

## 2017-07-02 ENCOUNTER — Ambulatory Visit (HOSPITAL_COMMUNITY): Payer: Medicare Other | Attending: Internal Medicine

## 2017-07-02 DIAGNOSIS — M5412 Radiculopathy, cervical region: Secondary | ICD-10-CM | POA: Diagnosis not present

## 2017-07-02 DIAGNOSIS — M62830 Muscle spasm of back: Secondary | ICD-10-CM

## 2017-07-02 DIAGNOSIS — R293 Abnormal posture: Secondary | ICD-10-CM

## 2017-07-02 NOTE — Therapy (Signed)
[place], [place], [address] Phone: [phone]   Fax:  [phone]  Physical Therapy Treatment  Patient Details  Name: Tom Marshall MRN: [id number] Date of Birth: January 20, 1951 Referring Provider: Asencion Noble, MD   Encounter Date: 07/02/2017      PT End of Session - 07/02/17 1436    Visit Number 6   Number of Visits 9   Date for PT Re-Evaluation 07/11/17   Authorization Type Medicare   Authorization Time Period 06/13/17 to 07/14/17   PT Start Time 1436   PT Stop Time 1515   PT Time Calculation (min) 39 min   Activity Tolerance Patient tolerated treatment well;No increased pain   Behavior During Therapy [facility] for tasks assessed/performed      History reviewed. No pertinent past medical history.  History reviewed. No pertinent surgical history.  There were no vitals filed for this visit.      Subjective Assessment - 07/02/17 1436    Subjective Pt states that his neck is feeling okay. He said that his L shoulder is still bugging him but it a little better.   Pertinent History DM2, HLD   Patient Stated Goals improve pain    Currently in Pain? No/denies              [facility] Adult PT Treatment/Exercise - 07/02/17 0001      Neck Exercises: Standing   Wall Push Ups 10 reps   Wall Push Ups Limitations 2 sets, with plus   Other Standing Exercises scap retraction and low rows with RTB x15 reps each; Y's on wall and liftoff x10 reps   Other Standing Exercises wall walking with LUE x15 reps     Neck Exercises: Supine   UE D2 Limitations chin tuck and D2 PNF flexion with RTB 2x10 each   Other Supine Exercise BUE elevation with 2# bar x15 reps      Neck Exercises: Sidelying   Other Sidelying Exercise L shoulder abd and flexion with scapular assist x20 reps each   Other Sidelying Exercise L shoulder ER with 1# DB x15 reps with towel roll     Neck Exercises: Stretches   Corner Stretch 3 reps;30 seconds              PT Short Term Goals - 06/13/17 1515      PT SHORT TERM GOAL #1   Title Pt will demo consistency and independence with her HEP to improve cervical ROM and pain    Time 2   Period Weeks   Status New   Target Date 06/27/17           PT Long Term Goals - 06/13/17 1516      PT LONG TERM GOAL #1   Title Pt will demo proper log roll mechanics during his session, without cuing from therapist, to decrease strain to his neck musculature.    Time 4   Status New   Target Date 07/11/17     PT LONG TERM GOAL #2   Title Pt will demo improved cervical AROM to within 5 deg of the Lt and Rt, to improve his ability to turn his head while driving without pain.    Time 4   Period Weeks   Status New     PT LONG TERM GOAL #3   Title Pt will demo improved cervical mobility and deep neck flexor strength evident by his ability to complete upright chin tucks, x10 reps,  without cuing from therapist.    Time 4   Period Weeks   Status New     PT LONG TERM GOAL #4   Title Pt will report atleast 50% improvement in his symptoms from the start of PT, to increase his activity tolerance and quality of life.    Time 4   Period Weeks   Status New               Plan - 07/02/17 1518    Clinical Impression Statement Pt presented to therapy and reported no neck pain or R shoulder pain, but he does report continued L shoulder pain. Session focused on LUE this date with no reports of increased pain but he did verbalize fatigue at EOS. Most of session focused on scap stabilization and ROM this date. Will continue to work on pt's L shoulder ROM, scapular ROM, and scapular stabilization.    Rehab Potential Good   PT Frequency 2x / week   PT Duration 4 weeks   PT Treatment/Interventions ADLs/Self Care Home Management;Traction;Moist Heat;Therapeutic exercise;Therapeutic activities;Neuromuscular re-education;Patient/family education;Dry needling;Manual techniques;Passive range of motion   PT  Next Visit Plan continue to work on postural strengthening; pec minor stretch/manual; Lt scapular mobility and stability   PT Home Exercise Plan supine cervical rotation, supine chin tucks, thoracic rotation stretch, lumbar roll, supine shoulder flexion with SPC   Consulted and Agree with Plan of Care Patient      Patient will benefit from skilled therapeutic intervention in order to improve the following deficits and impairments:  Decreased activity tolerance, Decreased range of motion, Impaired flexibility, Hypomobility, Decreased strength, Increased muscle spasms, Postural dysfunction, Pain, Improper body mechanics, Decreased mobility  Visit Diagnosis: Radiculopathy, cervical region  Muscle spasm of back  Abnormal posture     Problem List There are no active problems to display for this patient.      Geraldine Solar PT, [place] [address] [place], [place], [address] Phone: [phone]   Fax:  [phone]  Name: Tom Marshall MRN: [id number] Date of Birth: June 05, 1951

## 2017-07-04 ENCOUNTER — Encounter (HOSPITAL_COMMUNITY): Payer: Self-pay

## 2017-07-04 ENCOUNTER — Ambulatory Visit (HOSPITAL_COMMUNITY): Payer: Medicare Other

## 2017-07-04 DIAGNOSIS — M5412 Radiculopathy, cervical region: Secondary | ICD-10-CM

## 2017-07-04 DIAGNOSIS — M62830 Muscle spasm of back: Secondary | ICD-10-CM

## 2017-07-04 DIAGNOSIS — R293 Abnormal posture: Secondary | ICD-10-CM

## 2017-07-04 NOTE — Patient Instructions (Addendum)
  Posterior Capsule Stretch  Bring affected arm across the body and hold.  Perform 2x/day, 3-5 stretches holding for 15-30 seconds   DOORWAY STRETCH  While standing in a doorway, place your arms up on the door jam and place one foot forward through the doorway as shown. Next, bend the front knee until a stretch is felt along the front of your chest and/or shoulders. Your upper arms should be horizontal to the ground and forearms should lie up along the door frame.   Perform 2x/day, 3-5 stretches holding for 15-30 seconds

## 2017-07-04 NOTE — Therapy (Signed)
[place], [place], [address] Phone: [phone]   Fax:  [phone]  Physical Therapy Treatment  Patient Details  Name: Tom Marshall MRN: [id number] Date of Birth: 1951/08/06 Referring Provider: Asencion Noble, MD   Encounter Date: 07/04/2017      PT End of Session - 07/04/17 1435    Visit Number 7   Number of Visits 9   Date for PT Re-Evaluation 07/11/17   Authorization Type Medicare   Authorization Time Period 06/13/17 to 07/14/17   PT Start Time 1432   PT Stop Time 0109   PT Time Calculation (min) 42 min   Activity Tolerance Patient tolerated treatment well;No increased pain   Behavior During Therapy [facility] for tasks assessed/performed      History reviewed. No pertinent past medical history.  History reviewed. No pertinent surgical history.  There were no vitals filed for this visit.      Subjective Assessment - 07/04/17 1435    Subjective Pt states that his L shoulder was sore Tuesday after his appointment and a little sore yesterday. It is a little more sore today.    Pertinent History DM2, HLD   Patient Stated Goals improve pain    Currently in Pain? Yes   Pain Score 6    Pain Location Shoulder   Pain Orientation Left   Pain Descriptors / Indicators Sore   Pain Type Chronic pain   Pain Onset More than a month ago   Pain Frequency Intermittent   Aggravating Factors  moving it   Pain Relieving Factors rest   Effect of Pain on Daily Activities increases               [facility] Adult PT Treatment/Exercise - 07/04/17 0001      Neck Exercises: Standing   Wall Push Ups 10 reps   Wall Push Ups Limitations with plus   Other Standing Exercises Y's on wall with RTB x10 reps   Other Standing Exercises shoulder taps in modified plantigrade position at counter x10 reps     Neck Exercises: Seated   Upper Extremity D2 Flexion;10 reps;Theraband   UE D2 Limitations no resistance     Neck Exercises:  Supine   Other Supine Exercise BUE elevation with 2# bar x20 reps      Neck Exercises: Sidelying   Other Sidelying Exercise L shoulder abd with proper scap stab and min upward rotation assist x 5 reps     Manual Therapy   Manual Therapy Soft tissue mobilization;Joint mobilization   Manual therapy comments separate rest of session   Joint Mobilization Grade III scapular mobs in all directions but especially upward rotation   Soft tissue mobilization seated trigger point ischemic release and cross friction to L middle trap, rhomboid     Neck Exercises: Stretches   Chest Stretch 3 reps;30 seconds  doorway   Other Neck Stretches posterior capsule stretch 3x30 sec                PT Education - 07/04/17 1514    Education provided Yes   Education Details updated HEP to include posterior capsule stretch and doorway stretch   Person(s) Educated Patient   Methods Explanation;Demonstration;Handout   Comprehension Verbalized understanding;Returned demonstration          PT Short Term Goals - 06/13/17 1515      PT SHORT TERM GOAL #1   Title Pt will demo consistency and independence with her HEP to  improve cervical ROM and pain    Time 2   Period Weeks   Status New   Target Date 06/27/17           PT Long Term Goals - 06/13/17 1516      PT LONG TERM GOAL #1   Title Pt will demo proper log roll mechanics during his session, without cuing from therapist, to decrease strain to his neck musculature.    Time 4   Status New   Target Date 07/11/17     PT LONG TERM GOAL #2   Title Pt will demo improved cervical AROM to within 5 deg of the Lt and Rt, to improve his ability to turn his head while driving without pain.    Time 4   Period Weeks   Status New     PT LONG TERM GOAL #3   Title Pt will demo improved cervical mobility and deep neck flexor strength evident by his ability to complete upright chin tucks, x10 reps, without cuing from therapist.    Time 4   Period  Weeks   Status New     PT LONG TERM GOAL #4   Title Pt will report atleast 50% improvement in his symptoms from the start of PT, to increase his activity tolerance and quality of life.    Time 4   Period Weeks   Status New               Plan - 07/04/17 1516    Clinical Impression Statement Pt reporting increased pain at L posterior shoulder this date. Pt noted to have palpable trigger points in middle trap and rhomboid; he reported decreased pain following manual. Pt's L scapula noted to have increased winging during OH movements so instructed pt on proper scap stab prior to [facility] movements. Also performed scapular mobs in all directions. Pt reported 2/10 pain at EOS.    Rehab Potential Good   PT Frequency 2x / week   PT Duration 4 weeks   PT Treatment/Interventions ADLs/Self Care Home Management;Traction;Moist Heat;Therapeutic exercise;Therapeutic activities;Neuromuscular re-education;Patient/family education;Dry needling;Manual techniques;Passive range of motion   PT Next Visit Plan continue to work on postural strengthening; pec minor stretch/manual; continue with Lt scapular mobility and stability   PT Home Exercise Plan supine cervical rotation, supine chin tucks, thoracic rotation stretch, lumbar roll, supine shoulder flexion with SPC; 9/6: posterior capsule stretch, doorway stretch   Consulted and Agree with Plan of Care Patient      Patient will benefit from skilled therapeutic intervention in order to improve the following deficits and impairments:  Decreased activity tolerance, Decreased range of motion, Impaired flexibility, Hypomobility, Decreased strength, Increased muscle spasms, Postural dysfunction, Pain, Improper body mechanics, Decreased mobility  Visit Diagnosis: Radiculopathy, cervical region  Muscle spasm of back  Abnormal posture     Problem List There are no active problems to display for this patient.      Geraldine Solar PT, [place] [address] [place], [place], [address] Phone: [phone]   Fax:  [phone]  Name: Tom Marshall MRN: [id number] Date of Birth: [birth year]

## 2017-07-08 ENCOUNTER — Encounter (HOSPITAL_COMMUNITY): Payer: Self-pay

## 2017-07-08 ENCOUNTER — Ambulatory Visit (HOSPITAL_COMMUNITY): Payer: Medicare Other

## 2017-07-08 ENCOUNTER — Telehealth (HOSPITAL_COMMUNITY): Payer: Self-pay

## 2017-07-08 DIAGNOSIS — M62830 Muscle spasm of back: Secondary | ICD-10-CM

## 2017-07-08 DIAGNOSIS — M5412 Radiculopathy, cervical region: Secondary | ICD-10-CM | POA: Diagnosis not present

## 2017-07-08 DIAGNOSIS — R293 Abnormal posture: Secondary | ICD-10-CM

## 2017-07-08 NOTE — Therapy (Signed)
[place], [place], [address] Phone: [phone]   Fax:  [phone]  Physical Therapy Treatment  Patient Details  Name: Tom Marshall MRN: [id number] Date of Birth: 1951-09-27 Referring Provider: Asencion Noble, MD   Encounter Date: 07/08/2017      PT End of Session - 07/08/17 1408    Visit Number 8   Number of Visits 9   Date for PT Re-Evaluation 07/11/17   Authorization Type Medicare   Authorization Time Period 06/13/17 to 07/14/17   PT Start Time 1406  pt arrived late   PT Stop Time 1430   PT Time Calculation (min) 24 min   Activity Tolerance Patient tolerated treatment well;No increased pain   Behavior During Therapy [facility] for tasks assessed/performed      History reviewed. No pertinent past medical history.  History reviewed. No pertinent surgical history.  There were no vitals filed for this visit.      Subjective Assessment - 07/08/17 1408    Subjective Pt states that his neck and L shoulder have felt good. His only pain is in the middle/center of his back.   Pertinent History DM2, HLD   Patient Stated Goals improve pain    Currently in Pain? No/denies   Pain Onset More than a month ago              [facility] Adult PT Treatment/Exercise - 07/08/17 0001      Neck Exercises: Standing   Wall Push Ups 15 reps   Wall Push Ups Limitations with plus   Other Standing Exercises ER and IR with RTB and towel roll x15 reps each   Other Standing Exercises scap retraction, low rows, high rows with GTB x15 reps each; bear hugs with GTB x15 reps     Neck Exercises: Seated   Upper Extremity D2 Flexion;15 reps;Theraband   Theraband Level (UE D2) Level 1 (Yellow)   Other Seated Exercise vert and horiz perturbations with elbow bent 3x10" each              PT Education - 07/08/17 1430    Education provided Yes   Education Details exercise technique, will reassess next session   Person(s) Educated  Patient   Methods Explanation;Demonstration   Comprehension Verbalized understanding;Returned demonstration          PT Short Term Goals - 06/13/17 1515      PT SHORT TERM GOAL #1   Title Pt will demo consistency and independence with her HEP to improve cervical ROM and pain    Time 2   Period Weeks   Status New   Target Date 06/27/17           PT Long Term Goals - 06/13/17 1516      PT LONG TERM GOAL #1   Title Pt will demo proper log roll mechanics during his session, without cuing from therapist, to decrease strain to his neck musculature.    Time 4   Status New   Target Date 07/11/17     PT LONG TERM GOAL #2   Title Pt will demo improved cervical AROM to within 5 deg of the Lt and Rt, to improve his ability to turn his head while driving without pain.    Time 4   Period Weeks   Status New     PT LONG TERM GOAL #3   Title Pt will demo improved cervical mobility and deep neck flexor strength evident by  his ability to complete upright chin tucks, x10 reps, without cuing from therapist.    Time 4   Period Weeks   Status New     PT LONG TERM GOAL #4   Title Pt will report atleast 50% improvement in his symptoms from the start of PT, to increase his activity tolerance and quality of life.    Time 4   Period Weeks   Status New               Plan - 07/08/17 1524    Clinical Impression Statement Session limited as pt arrived late for appiontment. Today's session focused on strengthening and stabilization with no reports of increased pain, but he did require increased cues for proper technique. Pt reports he feels like he is improving overall. Pt due for reassessment next visit.   Rehab Potential Good   PT Frequency 2x / week   PT Duration 4 weeks   PT Treatment/Interventions ADLs/Self Care Home Management;Traction;Moist Heat;Therapeutic exercise;Therapeutic activities;Neuromuscular re-education;Patient/family education;Dry needling;Manual techniques;Passive  range of motion   PT Next Visit Plan reassess   PT Home Exercise Plan supine cervical rotation, supine chin tucks, thoracic rotation stretch, lumbar roll, supine shoulder flexion with SPC; 9/6: posterior capsule stretch, doorway stretch   Consulted and Agree with Plan of Care Patient      Patient will benefit from skilled therapeutic intervention in order to improve the following deficits and impairments:  Decreased activity tolerance, Decreased range of motion, Impaired flexibility, Hypomobility, Decreased strength, Increased muscle spasms, Postural dysfunction, Pain, Improper body mechanics, Decreased mobility  Visit Diagnosis: Radiculopathy, cervical region  Muscle spasm of back  Abnormal posture     Problem List There are no active problems to display for this patient.      Geraldine Solar PT, [place] [address] [place], [place], [address] Phone: [phone]   Fax:  [phone]  Name: Tom Marshall MRN: [id number] Date of Birth: [birth year]

## 2017-07-08 NOTE — Telephone Encounter (Signed)
No show #1; left message regarding missed appointment. Reminded pt of next scheduled appointment and encouraged him to call if he could not make it.   Geraldine Solar PT, DPT

## 2017-07-10 ENCOUNTER — Telehealth (HOSPITAL_COMMUNITY): Payer: Self-pay | Admitting: Internal Medicine

## 2017-07-10 NOTE — Telephone Encounter (Signed)
07/10/17  pt said he needed to rescehdule to a day that would suit him better.... rescheduled for 9/19

## 2017-07-11 ENCOUNTER — Ambulatory Visit (HOSPITAL_COMMUNITY): Payer: Medicare Other

## 2017-07-17 ENCOUNTER — Ambulatory Visit (HOSPITAL_COMMUNITY): Payer: Medicare Other

## 2017-07-17 DIAGNOSIS — M5412 Radiculopathy, cervical region: Secondary | ICD-10-CM | POA: Diagnosis not present

## 2017-07-17 DIAGNOSIS — M62830 Muscle spasm of back: Secondary | ICD-10-CM | POA: Diagnosis not present

## 2017-07-17 DIAGNOSIS — R293 Abnormal posture: Secondary | ICD-10-CM | POA: Diagnosis not present

## 2017-07-17 NOTE — Therapy (Addendum)
PHYSICAL THERAPY DISCHARGE SUMMARY  Visits from Start of Care: 9  Current functional level related to goals / functional outcomes: *see below    Remaining deficits: *see below; new insidious onset symptoms at day of reassessment    Education / Equipment: *N/A Plan: Patient agrees to discharge.  Patient goals were not met. Patient is being discharged due to a change in medical status.  ?????     9:07 PM, 07/17/17 Etta Grandchild, PT, DPT Physical Therapist at [facility] [phone] (office)                 [place] [address] [place], [place], [address] Phone: [phone]   Fax:  [phone]  Physical Therapy Treatment  Patient Details  Name: Tom Marshall MRN: [id number] Date of Birth: 07/21/1951 Referring Provider: Asencion Noble   Encounter Date: 07/17/2017      PT End of Session - 07/17/17 2038    Visit Number 9   Number of Visits 9   Date for PT Re-Evaluation 07/11/17   Authorization Type Medicare   Authorization Time Period 06/13/17 to 07/14/17   Authorization - Visit Number 9   Authorization - Number of Visits 10   PT Start Time 7124  pt arrived late    PT Stop Time 1515   PT Time Calculation (min) 30 min   Activity Tolerance Patient tolerated treatment well;No increased pain   Behavior During Therapy [facility] for tasks assessed/performed      No past medical history on file.  No past surgical history on file.  There were no vitals filed for this visit.      Subjective Assessment - 07/17/17 1444    Subjective Pt report he has had a significant problem with pain increases since last visit 9DA. Pt reports no increased pain with head turns, but no c/o new pain in bliat posterior shoulders, 8/10 achy and sharp when moving from seated to standing, which resolves to 0/10 once fully upright.    Pertinent History DM2, HLD, Kidney CA in 1996, cholcystectomy    How long can  you sit comfortably? unlimited    How long can you stand comfortably? unlimited    Currently in Pain? No/denies            [facility] PT Assessment - 07/17/17 0001      Assessment   Medical Diagnosis Neck Pain    Referring Provider Asencion Noble    Onset Date/Surgical Date --  July 2018    Prior Therapy pt reports having PT and traction ~10 years ago which improved his symptoms.      Balance Screen   Has the patient fallen in the past 6 months No   Has the patient had a decrease in activity level because of a fear of falling?  Yes   Is the patient reluctant to leave their home because of a fear of falling?  Yes     Sensation   Light Touch Appears Intact     Posture/Postural Control   Posture Comments sacral sitting slouched in his chair, slight Rt cervical tilt, excessive forward head and rounded shoulders      ROM / Strength   AROM / PROM / Strength PROM     AROM   Cervical Flexion --  preferred posture is in End Range flexion    Cervical Extension 16  20 at eval   Cervical - Right Side Bend 18  20  at eval   Cervical - Left Side Bend 5  40 at eval    Cervical - Right Rotation 49  was 65 at eval    Cervical - Left Rotation 46  50 at eval      PROM   PROM Assessment Site Cervical   Cervical - Right Side Bend --  (supine, no radicular symptoms)   Cervical - Left Side Bend --  (supine, no radicular symptoms)   Cervical - Right Rotation 68   supine, pain free   Cervical - Left Rotation 64  supine, pain free     Strength   Strength Assessment Site Shoulder   Right Shoulder Flexion 3+/5  severe pain limitations (was 5/5 at eval)    Right Shoulder ABduction 3+/5  severe pain limitations; 5/5 at eval   Right Shoulder Internal Rotation 5/5  pain free in supine   Right Shoulder External Rotation 5/5  pain free in supine   Left Shoulder Flexion 3/5  severe pain limitations (was 5/5 at eval)    Left Shoulder ABduction 3+/5  severe pain limitations; 5/5 at eval   Left  Shoulder Internal Rotation 4/5  very painful   Left Shoulder External Rotation 4/5  very painful   Right Elbow Flexion 5/5   Right Elbow Extension 5/5   Left Elbow Flexion 4+/5  anterior brachial pain    Left Elbow Extension 5/5     Special Tests    Special Tests --  T-spine percussion test (-); distraction/compression (-)      Bed Mobility   Bed Mobility Supine to Sit   Supine to Sit 3: Mod assist                             PT Education - 07/17/17 2037    Education provided Yes   Education Details Cervical pain does not typicaly refer to the left anterior thorax; needs medical clearance    Person(s) Educated Patient   Methods Explanation   Comprehension Verbalized understanding          PT Short Term Goals - 07/17/17 2054      PT SHORT TERM GOAL #1   Title Pt will demo consistency and independence with his HEP to improve cervical ROM and pain    Baseline pain improved, range unchanged   Status Partially Met           PT Long Term Goals - 07/17/17 2054      PT LONG TERM GOAL #1   Title Pt will demo proper log roll mechanics during his session, without cuing from therapist, to decrease strain to his neck musculature.    Baseline bed mobility continues to require physical assistance    Status Not Met     PT LONG TERM GOAL #2   Title Pt will demo improved cervical AROM to within 5 deg of the Lt and Rt, to improve his ability to turn his head while driving without pain.    Baseline ROM unchanged, remains very weak   Status Not Met     PT LONG TERM GOAL #3   Title Pt will demo improved cervical mobility and deep neck flexor strength evident by his ability to complete upright chin tucks, x10 reps, without cuing from therapist.    Time 4   Status Unable to assess     PT LONG TERM GOAL #4   Title Pt will report atleast 50% improvement in  his symptoms from the start of PT, to increase his activity tolerance and quality of life.    Baseline  Met as of visit 8, but now limited by new symptoms   Status Achieved               Plan - 07-18-17 December 17, 2039    Clinical Impression Statement Pt arrives late for session, reports he has been in acute distress over the past week. Reassessmen tis performed this date. Examination reveals improvement and resolution of primary neckpain and radicular symptoms. He continues to have significant pain with isolated testing of left shoulder rotator cuff muscles. He continues to have significant weakness of the cervical extensor group with inability to correct posture seated, however tolerated normal anatomical posture and reduction of forward head posturing in supine with P/ROM , pain free. Seated today he demonstrates UE pain bilat with most testing of muslces, inhibiting strength to levels weaker than at eval. He c/o a new pain, which is demonstrated to be lateral to the thoracic spine bilaterally, at the T4/5 level, which occurs only with sit to stand transfers (8/10 NPRS) with immediate resolution with stopping at full upright stance. He reports this pain to be associated with referral to the left chest to abdominal area as well. Pt reports as pain to be new, unrealated to reason for original referral to PT. These symptoms are inconsistent with typical presentation of cervical readiculopathy, not repropduced with A/ROM nor P/ROM, nor are they reproduced with cervical compression. Cervical spine manual traction does nto change symptoms. Left shoulder remains very limited at this time and warrants continued PT services, as well as cervical extensor weakness and significant postural deficits, however in light of new findings and Pt history of kidney CA, patient is referred back to PCP at this time to reevaluate new onset thoracic pain with chest pain referral.    Clinical Presentation Evolving   Clinical Presentation due to: new symptoms, with pain in thoracic spine and referral to left chest.   Clinical Decision  Making High   PT Frequency 2x / week   PT Duration 4 weeks   PT Treatment/Interventions ADLs/Self Care Home Management;Traction;Moist Heat;Therapeutic exercise;Therapeutic activities;Neuromuscular re-education;Patient/family education;Dry needling;Manual techniques;Passive range of motion   PT Home Exercise Plan supine cervical rotation, supine chin tucks, thoracic rotation stretch, lumbar roll, supine shoulder flexion with SPC; 9/6: posterior capsule stretch, doorway stretch   Consulted and Agree with Plan of Care Patient      Patient will benefit from skilled therapeutic intervention in order to improve the following deficits and impairments:  Decreased activity tolerance, Decreased range of motion, Impaired flexibility, Hypomobility, Decreased strength, Increased muscle spasms, Postural dysfunction, Pain, Improper body mechanics, Decreased mobility  Visit Diagnosis: Radiculopathy, cervical region - Plan: PT plan of care cert/re-cert  Muscle spasm of back - Plan: PT plan of care cert/re-cert  Abnormal posture - Plan: PT plan of care cert/re-cert       G-Codes - 2017-07-18 12-17-2055    Functional Assessment Tool Used (Outpatient Only) FOTO: 58% limited    Functional Limitation Other PT primary   Other PT Primary Goal Status (T5176) At least 20 percent but less than 40 percent impaired, limited or restricted   Other PT Primary Discharge Status (H6073) At least 20 percent but less than 40 percent impaired, limited or restricted      Problem List There are no active problems to display for this patient.  9:04 PM, 2017-07-18 Etta Grandchild, PT, DPT Physical Therapist at  [facility] [phone] (office)      Etta Grandchild 07/17/2017, 9:04 PM  [place] [address] [place], [place], [address] Phone: [phone]   Fax:  [phone]  Name: Tom Marshall MRN: [id number] Date of Birth: [birth year]

## 2017-07-23 ENCOUNTER — Inpatient Hospital Stay (HOSPITAL_COMMUNITY)
Admission: EM | Admit: 2017-07-23 | Discharge: 2017-07-31 | DRG: 683 | Disposition: A | Discharge status: Home Health | Payer: Medicare Other | Attending: Internal Medicine | Admitting: Internal Medicine

## 2017-07-23 ENCOUNTER — Encounter (HOSPITAL_COMMUNITY): Payer: Self-pay | Admitting: *Deleted

## 2017-07-23 ENCOUNTER — Emergency Department (HOSPITAL_COMMUNITY): Payer: Medicare Other

## 2017-07-23 DIAGNOSIS — R1032 Left lower quadrant pain: Secondary | ICD-10-CM | POA: Diagnosis not present

## 2017-07-23 DIAGNOSIS — N19 Unspecified kidney failure: Secondary | ICD-10-CM

## 2017-07-23 DIAGNOSIS — S32009G Unspecified fracture of unspecified lumbar vertebra, subsequent encounter for fracture with delayed healing: Secondary | ICD-10-CM

## 2017-07-23 DIAGNOSIS — K4090 Unilateral inguinal hernia, without obstruction or gangrene, not specified as recurrent: Secondary | ICD-10-CM | POA: Diagnosis not present

## 2017-07-23 DIAGNOSIS — F17220 Nicotine dependence, chewing tobacco, uncomplicated: Secondary | ICD-10-CM | POA: Diagnosis present

## 2017-07-23 DIAGNOSIS — N281 Cyst of kidney, acquired: Secondary | ICD-10-CM | POA: Diagnosis not present

## 2017-07-23 DIAGNOSIS — N2881 Hypertrophy of kidney: Secondary | ICD-10-CM | POA: Diagnosis present

## 2017-07-23 DIAGNOSIS — D696 Thrombocytopenia, unspecified: Secondary | ICD-10-CM | POA: Diagnosis present

## 2017-07-23 DIAGNOSIS — E869 Volume depletion, unspecified: Secondary | ICD-10-CM | POA: Diagnosis present

## 2017-07-23 DIAGNOSIS — E785 Hyperlipidemia, unspecified: Secondary | ICD-10-CM | POA: Diagnosis present

## 2017-07-23 DIAGNOSIS — R937 Abnormal findings on diagnostic imaging of other parts of musculoskeletal system: Secondary | ICD-10-CM

## 2017-07-23 DIAGNOSIS — N183 Chronic kidney disease, stage 3 (moderate): Secondary | ICD-10-CM | POA: Diagnosis present

## 2017-07-23 DIAGNOSIS — M545 Low back pain, unspecified: Secondary | ICD-10-CM

## 2017-07-23 DIAGNOSIS — R109 Unspecified abdominal pain: Secondary | ICD-10-CM | POA: Diagnosis present

## 2017-07-23 DIAGNOSIS — R7989 Other specified abnormal findings of blood chemistry: Secondary | ICD-10-CM | POA: Diagnosis present

## 2017-07-23 DIAGNOSIS — E119 Type 2 diabetes mellitus without complications: Secondary | ICD-10-CM | POA: Diagnosis present

## 2017-07-23 DIAGNOSIS — Z7982 Long term (current) use of aspirin: Secondary | ICD-10-CM

## 2017-07-23 DIAGNOSIS — Z6832 Body mass index (BMI) 32.0-32.9, adult: Secondary | ICD-10-CM

## 2017-07-23 DIAGNOSIS — R34 Anuria and oliguria: Secondary | ICD-10-CM | POA: Diagnosis present

## 2017-07-23 DIAGNOSIS — N179 Acute kidney failure, unspecified: Principal | ICD-10-CM

## 2017-07-23 DIAGNOSIS — Z23 Encounter for immunization: Secondary | ICD-10-CM

## 2017-07-23 DIAGNOSIS — E1122 Type 2 diabetes mellitus with diabetic chronic kidney disease: Secondary | ICD-10-CM | POA: Diagnosis present

## 2017-07-23 DIAGNOSIS — S32039A Unspecified fracture of third lumbar vertebra, initial encounter for closed fracture: Secondary | ICD-10-CM | POA: Diagnosis present

## 2017-07-23 DIAGNOSIS — Z66 Do not resuscitate: Secondary | ICD-10-CM | POA: Diagnosis present

## 2017-07-23 DIAGNOSIS — R000 Tachycardia, unspecified: Secondary | ICD-10-CM | POA: Diagnosis present

## 2017-07-23 DIAGNOSIS — R079 Chest pain, unspecified: Secondary | ICD-10-CM | POA: Diagnosis not present

## 2017-07-23 DIAGNOSIS — I129 Hypertensive chronic kidney disease with stage 1 through stage 4 chronic kidney disease, or unspecified chronic kidney disease: Secondary | ICD-10-CM | POA: Diagnosis present

## 2017-07-23 DIAGNOSIS — G249 Dystonia, unspecified: Secondary | ICD-10-CM | POA: Diagnosis present

## 2017-07-23 DIAGNOSIS — Z85528 Personal history of other malignant neoplasm of kidney: Secondary | ICD-10-CM

## 2017-07-23 DIAGNOSIS — M899 Disorder of bone, unspecified: Secondary | ICD-10-CM | POA: Diagnosis present

## 2017-07-23 DIAGNOSIS — E1169 Type 2 diabetes mellitus with other specified complication: Secondary | ICD-10-CM | POA: Diagnosis not present

## 2017-07-23 DIAGNOSIS — D649 Anemia, unspecified: Secondary | ICD-10-CM | POA: Diagnosis not present

## 2017-07-23 DIAGNOSIS — Z79899 Other long term (current) drug therapy: Secondary | ICD-10-CM

## 2017-07-23 DIAGNOSIS — Z905 Acquired absence of kidney: Secondary | ICD-10-CM

## 2017-07-23 DIAGNOSIS — E875 Hyperkalemia: Secondary | ICD-10-CM | POA: Diagnosis present

## 2017-07-23 DIAGNOSIS — D472 Monoclonal gammopathy: Secondary | ICD-10-CM | POA: Diagnosis not present

## 2017-07-23 DIAGNOSIS — R938 Abnormal findings on diagnostic imaging of other specified body structures: Secondary | ICD-10-CM | POA: Diagnosis not present

## 2017-07-23 DIAGNOSIS — N2889 Other specified disorders of kidney and ureter: Secondary | ICD-10-CM | POA: Diagnosis not present

## 2017-07-23 DIAGNOSIS — E669 Obesity, unspecified: Secondary | ICD-10-CM | POA: Diagnosis present

## 2017-07-23 HISTORY — DX: Spasmodic torticollis: G24.3

## 2017-07-23 HISTORY — DX: Malignant neoplasm of right kidney, except renal pelvis: C64.1

## 2017-07-23 HISTORY — DX: Type 2 diabetes mellitus without complications: E11.9

## 2017-07-23 LAB — CBC
HCT: 23 % — ABNORMAL LOW (ref 39.0–52.0)
Hemoglobin: 7.9 g/dL — ABNORMAL LOW (ref 13.0–17.0)
MCH: 31.6 pg (ref 26.0–34.0)
MCHC: 34.3 g/dL (ref 30.0–36.0)
MCV: 92 fL (ref 78.0–100.0)
PLATELETS: 56 10*3/uL — AB (ref 150–400)
RBC: 2.5 MIL/uL — AB (ref 4.22–5.81)
RDW: 15.6 % — AB (ref 11.5–15.5)
WBC: 8.2 10*3/uL (ref 4.0–10.5)

## 2017-07-23 LAB — BRAIN NATRIURETIC PEPTIDE: B NATRIURETIC PEPTIDE 5: 45 pg/mL (ref 0.0–100.0)

## 2017-07-23 LAB — BASIC METABOLIC PANEL
Anion gap: 12 (ref 5–15)
BUN: 53 mg/dL — AB (ref 6–20)
CALCIUM: 11.7 mg/dL — AB (ref 8.9–10.3)
CHLORIDE: 104 mmol/L (ref 101–111)
CO2: 20 mmol/L — ABNORMAL LOW (ref 22–32)
CREATININE: 7.34 mg/dL — AB (ref 0.61–1.24)
GFR calc non Af Amer: 7 mL/min — ABNORMAL LOW (ref >60)
GFR, EST AFRICAN AMERICAN: 8 mL/min — AB (ref >60)
Glucose, Bld: 126 mg/dL — ABNORMAL HIGH (ref 65–99)
Potassium: 4.5 mmol/L (ref 3.5–5.1)
SODIUM: 136 mmol/L (ref 135–145)

## 2017-07-23 LAB — HEPATIC FUNCTION PANEL
ALBUMIN: 3.7 g/dL (ref 3.5–5.0)
ALT: 18 U/L (ref 17–63)
AST: 18 U/L (ref 15–41)
Alkaline Phosphatase: 84 U/L (ref 38–126)
BILIRUBIN DIRECT: 0.1 mg/dL (ref 0.1–0.5)
BILIRUBIN TOTAL: 0.9 mg/dL (ref 0.3–1.2)
Indirect Bilirubin: 0.8 mg/dL (ref 0.3–0.9)
Total Protein: 7.7 g/dL (ref 6.5–8.1)

## 2017-07-23 LAB — NA AND K (SODIUM & POTASSIUM), RAND UR
Potassium Urine: 23 mmol/L
Sodium, Ur: 84 mmol/L

## 2017-07-23 LAB — GLUCOSE, CAPILLARY: Glucose-Capillary: 134 mg/dL — ABNORMAL HIGH (ref 65–99)

## 2017-07-23 LAB — TROPONIN I

## 2017-07-23 LAB — OCCULT BLOOD, POC DEVICE: Fecal Occult Bld: NEGATIVE

## 2017-07-23 LAB — LIPASE, BLOOD: LIPASE: 29 U/L (ref 11–51)

## 2017-07-23 MED ORDER — ONDANSETRON HCL 4 MG PO TABS: 4.0000 mg | ORAL_TABLET | Freq: Four times a day (QID) | ORAL | Status: DC | PRN

## 2017-07-23 MED ORDER — MORPHINE SULFATE (PF) 2 MG/ML IV SOLN: 2.0000 mg | INTRAVENOUS | Status: DC | PRN

## 2017-07-23 MED ORDER — INFLUENZA VAC SPLIT HIGH-DOSE 0.5 ML IM SUSY
0.5000 mL | PREFILLED_SYRINGE | INTRAMUSCULAR | Status: AC
  Administered 2017-07-25: 0.5 mL via INTRAMUSCULAR
  Filled 2017-07-23: qty 0.5

## 2017-07-23 MED ORDER — CYCLOBENZAPRINE HCL 10 MG PO TABS
10.0000 mg | ORAL_TABLET | Freq: Every day | ORAL | Status: DC
  Administered 2017-07-23 – 2017-07-30 (×8): 10 mg via ORAL
  Filled 2017-07-23 (×8): qty 1

## 2017-07-23 MED ORDER — ACETAMINOPHEN 650 MG RE SUPP
650.0000 mg | Freq: Four times a day (QID) | RECTAL | Status: DC | PRN
Start: 2017-07-23 — End: 2017-07-31

## 2017-07-23 MED ORDER — SODIUM CHLORIDE 0.9 % IV SOLN
INTRAVENOUS | Status: DC
  Administered 2017-07-23 – 2017-07-31 (×17): via INTRAVENOUS

## 2017-07-23 MED ORDER — DOCUSATE SODIUM 100 MG PO CAPS
100.0000 mg | ORAL_CAPSULE | Freq: Two times a day (BID) | ORAL | Status: DC
  Administered 2017-07-23 – 2017-07-31 (×16): 100 mg via ORAL
  Filled 2017-07-23 (×16): qty 1

## 2017-07-23 MED ORDER — PRAVASTATIN SODIUM 10 MG PO TABS
20.0000 mg | ORAL_TABLET | Freq: Every day | ORAL | Status: DC
  Administered 2017-07-24 – 2017-07-30 (×7): 20 mg via ORAL
  Filled 2017-07-23 (×7): qty 2

## 2017-07-23 MED ORDER — ACETAMINOPHEN 325 MG PO TABS: 650.0000 mg | ORAL_TABLET | Freq: Four times a day (QID) | ORAL | Status: DC | PRN

## 2017-07-23 MED ORDER — INSULIN ASPART 100 UNIT/ML ~~LOC~~ SOLN
0.0000 [IU] | Freq: Three times a day (TID) | SUBCUTANEOUS | Status: DC
Start: 2017-07-24 — End: 2017-07-31
  Administered 2017-07-24 – 2017-07-25 (×3): 2 [IU] via SUBCUTANEOUS
  Administered 2017-07-26 (×2): 3 [IU] via SUBCUTANEOUS
  Administered 2017-07-27 – 2017-07-29 (×5): 2 [IU] via SUBCUTANEOUS
  Administered 2017-07-30: 3 [IU] via SUBCUTANEOUS
  Administered 2017-07-31: 2 [IU] via SUBCUTANEOUS

## 2017-07-23 MED ORDER — ONDANSETRON HCL 4 MG/2ML IJ SOLN: 4.0000 mg | Freq: Four times a day (QID) | INTRAMUSCULAR | Status: DC | PRN

## 2017-07-23 MED ORDER — ASPIRIN EC 81 MG PO TBEC
81.0000 mg | DELAYED_RELEASE_TABLET | Freq: Every morning | ORAL | Status: DC
  Administered 2017-07-24 – 2017-07-31 (×8): 81 mg via ORAL
  Filled 2017-07-23 (×8): qty 1

## 2017-07-23 NOTE — H&P (Addendum)
History and Physical    Tom Marshall OQH:[id number] DOB: Jun 16, 1951 DOA: 07/23/2017  PCP: Asencion Noble, MD Consultants:  [place] - urology Patient coming from:  Home - lives alone; NOK: sister, [phone]   Chief Complaint:  Pallor, abdominal pain  HPI: Tom Marshall is a 66 y.o. male with medical history significant of DM, cervical dystonia, and R renal cell CA s/p nephrectomy presenting after he was sent from Dr. Ria Comment Marshall today.  Patient had a physical with Dr. 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  His neck started acting up after the appointment, h/o cervical dystonia; Dr. Willey Marshall  Told him it flared up again and he was sent to outpatient PT and it seems to be better.  All of a sudden he developed sharp pain in his back with getting up and laying down.  He got to where he couldn't sleep in the bed.  He went to a meeting Monday night and was told he was pale.  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  Pain in his back is lower left back and radiates into flank and left lower abdomen.  No SOB.  Voided more yesterday but less last week, voided 3 times yesterday but only 1-2 times daily last week.  No dysuria or hematuria.  +blood in his stool last Saturday night, bright red and only once.     ED Course: Acute renal failure and anemia.  CT with increasing left renal cyst and possibly subtle lesions concerning for mets.  Review of Systems: As per HPI; otherwise review of systems reviewed and negative.   Ambulatory Status:  Ambulates without assistance  Past Medical History:  Diagnosis Date  . Cancer of right kidney ([place])   . Cervical dystonia   . Diabetes mellitus without complication [facility])     Past Surgical History:  Procedure Laterality Date  . CHOLECYSTECTOMY  2007  . NEPHRECTOMY Right 1998   cancer    Social History   Social History  . Marital status: Single    Spouse name: N/A    . Number of children: N/A  . Years of education: N/A   Occupational History  . Part-time Clinical cytogeneticist, Insurance underwriter    Social History Main Topics  . Smoking status: Never Smoker  . Smokeless tobacco: Current User    Types: Chew  . Alcohol use No  . Drug use: No  . Sexual activity: Not on file   Other Topics Concern  . Not on file   Social History Narrative  . No narrative on file    No Known Allergies  Family History  Problem Relation Age of Onset  . Heart failure Mother 65  . Dementia Father     Prior to Admission medications   Not on File    Physical Exam: Vitals:   07/23/17 1900 07/23/17 2016 07/23/17 2017 07/23/17 2122  BP: 129/69   136/63  Pulse:    94  Resp: 16   20  Temp:    98.8 F (37.1 C)  TempSrc:    Oral  SpO2:   98% 99%  Weight:  99.4 kg (219 lb 2.2 oz)  99.4 kg (219 lb 2.2 oz)  Height:  '5\' 9"'  (1.753 m)       General:  Appears calm and comfortable and is NAD Eyes:  PERRL, EOMI, normal lids, iris ENT:  grossly normal hearing, lips &  tongue, mmm Neck:  no LAD, masses or thyromegaly; no carotid bruits Cardiovascular:  RRR, no m/r/g. No LE edema.  Respiratory:   CTA bilaterally with no wheezes/rales/rhonchi.  Normal respiratory effort. Abdomen:  soft, NT, ND, NABS Back:   normal alignment, no CVAT Skin:  no rash or induration seen on limited exam Musculoskeletal:  grossly normal tone BUE/BLE, good ROM, no bony abnormality Psychiatric:  grossly normal mood and affect, speech fluent and appropriate, AOx3 Neurologic:  CN 2-12 grossly intact, moves all extremities in coordinated fashion, sensation intact    Radiological Exams on Admission: Ct Abdomen Pelvis Wo Contrast  Result Date: 07/23/2017 CLINICAL DATA:  Mid chest on lower back pain with movement for 1 week, tachycardia, pale nose, history of renal cell carcinoma post RIGHT nephrectomy, diabetes mellitus, prior cholecystectomy EXAM: CT ABDOMEN AND PELVIS WITHOUT CONTRAST TECHNIQUE: Multidetector CT  imaging of the abdomen and pelvis was performed following the standard protocol without IV contrast. Sagittal and coronal MPR images reconstructed from axial data set. No oral contrast was administered. COMPARISON:  CT chest abdomen pelvis 01/25/2009 FINDINGS: Lower chest: Linear subsegmental atelectasis in BILATERAL lower lobes. Hepatobiliary: Gallbladder surgically absent. No focal hepatic abnormality. Pancreas: Normal appearance Spleen: Normal appearance. Small splenule adjacent to medial border of spleen Adrenals/Urinary Tract: Adrenal glands normal. Prior RIGHT nephrectomy. Enlarged LEFT kidney consistent with compensatory hypertrophy. Large cyst at inferior pole 10.4 x 7.3 x 6.5 cm increased since previous study when it measured 6.0 x 4.4 x 5.5 cm. Ureters and bladder unremarkable. Stomach/Bowel: Normal appendix. Redundant sigmoid loop. Stomach and bowel loops otherwise unremarkable. Vascular/Lymphatic: Atherosclerotic calcifications aorta, iliac arteries, and coronary arteries. Aorta normal caliber. No adenopathy. Reproductive: Unremarkable prostate gland and seminal vesicles Other: No free air or free fluid. Tiny inguinal hernias containing fat. No ventral hernia seen. Musculoskeletal: Scattered degenerative disc disease changes lower lumbar spine greatest at L3-L4. A number of question a subtle lytic lesions are identified within thoracolumbar vertebra, subtle lytic metastases not excluded. Fracture identified at the LEFT transverse process of L3, new since 2010. IMPRESSION: Interval increase in size of a large cyst the inferior pole of the LEFT kidney 10.4 cm greatest size. Questionable subtle lytic lesions within thoracic and lumbar vertebra; consider follow-up MR assessment to exclude subtle lytic metastases. Interval fracture of the LEFT transverse process of L3 since 2010. Tiny BILATERAL inguinal hernias containing fat. Electronically Signed   By: Lavonia Dana M.D.   On: 07/23/2017 15:25   Dg Chest 2  View  Result Date: 07/23/2017 CLINICAL DATA:  Chest pain, tachycardia. EXAM: CHEST  2 VIEW COMPARISON:  Radiographs of February 24, 2004. FINDINGS: Stable cardiomediastinal silhouette. No pneumothorax or pleural effusion is noted. No acute pulmonary disease is noted. Multilevel degenerative disc disease is noted in the midthoracic spine. IMPRESSION: No active cardiopulmonary disease. Electronically Signed   By: Marijo Conception, M.D.   On: 07/23/2017 14:57    EKG: Independently reviewed.  Sinus tachycardia with rate 119; nonspecific ST changes possibly rate-related   Labs on Admission: I have personally reviewed the available labs and imaging studies at the time of the admission.  Pertinent labs:   BUN 53/Creatinine 7.34/GFR 7; 17/1.08/71 in 7/18 Glucose 126, 134 Calcium 11.7; 10.3 in 7/18 BNP and troponin negative Hgb 7.9; 14.1 in 7/18 Platelets 56 Heme negative  Assessment/Plan Principal Problem:   Renal failure Active Problems:   Hypercalcemia   Anemia   Thrombocytopenia (HCC)   Diabetes mellitus type 2 in obese [facility])   -Patient  with previously normal labs 2-3 months ago now found to have marked renal failure; hypercalcemia; and anemia -CT is negative for hydronephrosis and appears to r/o obstructive uropathy; will request renal US -Patient without marked h/o dehydration, decreased PO intake, or GI losses; this makes prerenal azotemia unlikely -As such, this appears to be an intrinsic renal process -Patient d/w Dr. Junious Silk - this does not appear to be a urologic matter -Patient d/w Dr. Mercy Moore - will admit to [facility] and request nephrology assistance from Dr. Lowanda Foster in the AM -Will order UA, urine random protein, urine Na/K, and urine osmolality. -Based on this presentation, particularly in conjunction with possible lytic lesions, multiple myeloma is a serious consideration.  Will order SPEP, light chains, and oncology consult.  Patient is very likely to need a bone marrow  biopsy. -Because his renal dysfunction is likely not prerenal, IVF is not anticipated to dramatically help.  However, will provide IVF infusion overnight and recheck in AM. -Patient is oliguric but not anuric and so does not appear to require dialysis at this time. -Will admit, as it is likely to take several days to complete his initial evaluation and treatment. -He is taking several medications which are likely to exacerbate renal failure.  Voltaren has been discontinued; Cozaar, Glucophage have been held and may need to also be discontinued. -Hypercalcemia is not high enough to require treatment for now other than IVF. -Anemia does not require a transfusion at this time but the patient has been consented if this becomes necessary. -For his DM, will hold Invokana, Glucotrol, and Glucophage; check A1c; and cover with moderate-scale SSI.    DVT prophylaxis:  SCDs Code Status:  DNR - confirmed with patient/family Family Communication: Spoke by telephone with his sister  Disposition Plan:  Home once clinically improved Consults called: Oncology; nephrology  Admission status: Admit - It is my clinical opinion that admission to INPATIENT is reasonable and necessary because this patient will require at least 2 midnights in the hospital to treat this condition based on the medical complexity of the problems presented.  Given the aforementioned information, the predictability of an adverse outcome is felt to be significant.    Karmen Bongo MD [facility]  If note is complete, please contact covering daytime or nighttime physician. [email or web address] Password [facility]  07/23/2017, 11:41 PM

## 2017-07-23 NOTE — ED Notes (Signed)
Pt given meal tray.

## 2017-07-23 NOTE — ED Provider Notes (Signed)
[facility] DEPT Provider Note   CSN: [id number] Arrival date & time: 07/23/17  1322     History   Chief Complaint Chief Complaint  Patient presents with  . Chest Pain  . Tachycardia    HPI Tom Marshall is a 66 y.o. male.  HPI Patient presents to the emergency room for evaluation of  Tachycardia and looking pale.  Patient states he went to follow-up with Dr. Willey Blade this morning.He noticed over the last few days he has been having some intermittent episodes of sharp pain in his lower back and will radiate towards the front and his abdomen up towards his chest. He only gets this pains when he was either trying to sit up or sit down.  He also gets some shortness of breath. He noticed some blood in his stool over the weekend but not recently. He does not feel short of breath when he started standing and walking around. He denies any chest pain with exertion.  At Dr. Ria Comment office, he was noted to be tachycardic.   HE was sent to the ED for further evaluation. Past Medical History:  Diagnosis Date  . Cancer of right kidney ([place])   . Cervical dystonia   . Diabetes mellitus without complication ([place])     There are no active problems to display for this patient.   Past Surgical History:  Procedure Laterality Date  . CHOLECYSTECTOMY    . NEPHRECTOMY Right    cancer       Home Medications    Prior to Admission medications   Not on File    Family History No family history on file.  Social History Social History  Substance Use Topics  . Smoking status: Never Smoker  . Smokeless tobacco: Never Used  . Alcohol use No     Allergies   Patient has no known allergies.   Review of Systems Review of Systems  All other systems reviewed and are negative.    Physical Exam Updated Vital Signs BP (!) 117/53   Pulse 98   Temp 98.6 F (37 C) (Oral)   Resp 15   Ht 1.753 m (5\' 9" )   Wt 99.3 kg (219 lb)   SpO2 98%   BMI 32.34 kg/m   Physical Exam    Constitutional: He appears well-developed and well-nourished. No distress.  HENT:  Head: Normocephalic and atraumatic.  Right Ear: External ear normal.  Left Ear: External ear normal.  Eyes: Conjunctivae are normal. Right eye exhibits no discharge. Left eye exhibits no discharge. No scleral icterus.  Neck: Neck supple. No tracheal deviation present.  Cardiovascular: Normal rate, regular rhythm and intact distal pulses.   Pulmonary/Chest: Effort normal and breath sounds normal. No stridor. No respiratory distress. He has no wheezes. He has no rales.  Abdominal: Soft. Bowel sounds are normal. He exhibits no distension. There is no tenderness. There is no rebound and no guarding.  Musculoskeletal: He exhibits no edema.       Lumbar back: He exhibits tenderness.  Neurological: He is alert. He has normal strength. No cranial nerve deficit (no facial droop, extraocular movements intact, no slurred speech) or sensory deficit. He exhibits normal muscle tone. He displays no seizure activity. Coordination normal.  Skin: Skin is warm and dry. No rash noted.  Psychiatric: He has a normal mood and affect.  Nursing note and vitals reviewed.    ED Treatments / Results  Labs (all labs ordered are listed, but only abnormal results are displayed) Labs  Reviewed  BASIC METABOLIC PANEL - Abnormal; Notable for the following:       Result Value   CO2 20 (*)    Glucose, Bld 126 (*)    BUN 53 (*)    Creatinine, Ser 7.34 (*)    Calcium 11.7 (*)    GFR calc non Af Amer 7 (*)    GFR calc Af Amer 8 (*)    All other components within normal limits  CBC - Abnormal; Notable for the following:    RBC 2.50 (*)    Hemoglobin 7.9 (*)    HCT 23.0 (*)    RDW 15.6 (*)    Platelets 56 (*)    All other components within normal limits  TROPONIN I  HEPATIC FUNCTION PANEL  LIPASE, BLOOD  BRAIN NATRIURETIC PEPTIDE  OCCULT BLOOD, POC DEVICE  POC OCCULT BLOOD, ED  TYPE AND SCREEN    EKG  EKG  Interpretation  Date/Time:  Tuesday July 23 2017 13:34:59 EDT Ventricular Rate:  119 PR Interval:    QRS Duration: 90 QT Interval:  340 QTC Calculation: 478 R Axis:   64 Text Interpretation:  Sinus tachycardia Nonspecific ST and T wave abnormality Abnormal ECG No old tracing to compare Confirmed by Dorie Rank [id number]) on 07/23/2017 2:05:45 PM       Radiology Ct Abdomen Pelvis Wo Contrast  Result Date: 07/23/2017 CLINICAL DATA:  Mid chest on lower back pain with movement for 1 week, tachycardia, pale nose, history of renal cell carcinoma post RIGHT nephrectomy, diabetes mellitus, prior cholecystectomy EXAM: CT ABDOMEN AND PELVIS WITHOUT CONTRAST TECHNIQUE: Multidetector CT imaging of the abdomen and pelvis was performed following the standard protocol without IV contrast. Sagittal and coronal MPR images reconstructed from axial data set. No oral contrast was administered. COMPARISON:  CT chest abdomen pelvis 01/25/2009 FINDINGS: Lower chest: Linear subsegmental atelectasis in BILATERAL lower lobes. Hepatobiliary: Gallbladder surgically absent. No focal hepatic abnormality. Pancreas: Normal appearance Spleen: Normal appearance. Small splenule adjacent to medial border of spleen Adrenals/Urinary Tract: Adrenal glands normal. Prior RIGHT nephrectomy. Enlarged LEFT kidney consistent with compensatory hypertrophy. Large cyst at inferior pole 10.4 x 7.3 x 6.5 cm increased since previous study when it measured 6.0 x 4.4 x 5.5 cm. Ureters and bladder unremarkable. Stomach/Bowel: Normal appendix. Redundant sigmoid loop. Stomach and bowel loops otherwise unremarkable. Vascular/Lymphatic: Atherosclerotic calcifications aorta, iliac arteries, and coronary arteries. Aorta normal caliber. No adenopathy. Reproductive: Unremarkable prostate gland and seminal vesicles Other: No free air or free fluid. Tiny inguinal hernias containing fat. No ventral hernia seen. Musculoskeletal: Scattered degenerative disc disease  changes lower lumbar spine greatest at L3-L4. A number of question a subtle lytic lesions are identified within thoracolumbar vertebra, subtle lytic metastases not excluded. Fracture identified at the LEFT transverse process of L3, new since 2010. IMPRESSION: Interval increase in size of a large cyst the inferior pole of the LEFT kidney 10.4 cm greatest size. Questionable subtle lytic lesions within thoracic and lumbar vertebra; consider follow-up MR assessment to exclude subtle lytic metastases. Interval fracture of the LEFT transverse process of L3 since 2010. Tiny BILATERAL inguinal hernias containing fat. Electronically Signed   By: Lavonia Dana M.D.   On: 07/23/2017 15:25   Dg Chest 2 View  Result Date: 07/23/2017 CLINICAL DATA:  Chest pain, tachycardia. EXAM: CHEST  2 VIEW COMPARISON:  Radiographs of February 24, 2004. FINDINGS: Stable cardiomediastinal silhouette. No pneumothorax or pleural effusion is noted. No acute pulmonary disease is noted. Multilevel degenerative disc disease is noted  in the midthoracic spine. IMPRESSION: No active cardiopulmonary disease. Electronically Signed   By: Marijo Conception, M.D.   On: 07/23/2017 14:57    Procedures Procedures (including critical care time)  Medications Ordered in ED Medications  0.9 %  sodium chloride infusion (not administered)     Initial Impression / Assessment and Plan / ED Course  I have reviewed the triage vital signs and the nursing notes.  Pertinent labs & imaging results that were available during my care of the patient were reviewed by me and considered in my medical decision making (see chart for details).  Clinical Course as of Jul 23 1557  Tue Jul 23, 2017  1437 Pt is anemia.  Will guaic stool.  Add type and screen.  [JK]    Clinical Course User Index [JK] Dorie Rank, MD   The patient has a remote history of cancer of his right kidney status post nephrectomy. Patient presented to the emergency room for evaluation cardiac  and back pain. His laboratory workup is notable for acute renal failure and anemia. I don't have any old labs for comparison.  Patient's CT scan shows increasing left renal cyst. There is also the concern of the possibility of subtle lesions in the spine concerning for metastases. This is certainly a concern considering the patient's history of renal carcinoma. With his anemia and acute renal failure will consult with the medical service for further evaluation.    Final Clinical Impressions(s) / ED Diagnoses   Final diagnoses:  Renal failure, unspecified chronicity  Anemia, unspecified type  Midline low back pain without sciatica, unspecified chronicity  Abnormal CT of spine  Renal cyst      Dorie Rank, MD 07/23/17 [phone]

## 2017-07-23 NOTE — ED Triage Notes (Addendum)
Pt c/o mid chest pain and lower back pain with movement x 1 week. PT denies pain unless he's moving. Pt was seen at Dr. Ria Comment office today and was sent over to ED due to being pale, tachycardia (HR 112bpm at office), chest pain/back pain. Pt denies weakness, dizziness.

## 2017-07-24 ENCOUNTER — Inpatient Hospital Stay (HOSPITAL_COMMUNITY): Payer: Medicare Other

## 2017-07-24 DIAGNOSIS — N2889 Other specified disorders of kidney and ureter: Secondary | ICD-10-CM

## 2017-07-24 DIAGNOSIS — N179 Acute kidney failure, unspecified: Secondary | ICD-10-CM

## 2017-07-24 DIAGNOSIS — N281 Cyst of kidney, acquired: Secondary | ICD-10-CM | POA: Diagnosis not present

## 2017-07-24 DIAGNOSIS — S32009G Unspecified fracture of unspecified lumbar vertebra, subsequent encounter for fracture with delayed healing: Secondary | ICD-10-CM

## 2017-07-24 DIAGNOSIS — R1032 Left lower quadrant pain: Secondary | ICD-10-CM

## 2017-07-24 LAB — URINALYSIS, ROUTINE W REFLEX MICROSCOPIC
BACTERIA UA: NONE SEEN
Bilirubin Urine: NEGATIVE
Glucose, UA: >=500 mg/dL — AB
Hgb urine dipstick: NEGATIVE
Ketones, ur: NEGATIVE mg/dL
Leukocytes, UA: NEGATIVE
Nitrite: NEGATIVE
PROTEIN: 100 mg/dL — AB
SQUAMOUS EPITHELIAL / LPF: NONE SEEN
Specific Gravity, Urine: 1.014 (ref 1.005–1.030)
pH: 6 (ref 5.0–8.0)

## 2017-07-24 LAB — PREPARE RBC (CROSSMATCH)

## 2017-07-24 LAB — CBC
HCT: 20.8 % — ABNORMAL LOW (ref 39.0–52.0)
HEMOGLOBIN: 7 g/dL — AB (ref 13.0–17.0)
MCH: 31.3 pg (ref 26.0–34.0)
MCHC: 33.7 g/dL (ref 30.0–36.0)
MCV: 92.9 fL (ref 78.0–100.0)
PLATELETS: 48 10*3/uL — AB (ref 150–400)
RBC: 2.24 MIL/uL — ABNORMAL LOW (ref 4.22–5.81)
RDW: 15.9 % — ABNORMAL HIGH (ref 11.5–15.5)
WBC: 7.7 10*3/uL (ref 4.0–10.5)

## 2017-07-24 LAB — BASIC METABOLIC PANEL
Anion gap: 11 (ref 5–15)
BUN: 49 mg/dL — AB (ref 6–20)
CHLORIDE: 107 mmol/L (ref 101–111)
CO2: 20 mmol/L — ABNORMAL LOW (ref 22–32)
Calcium: 11.1 mg/dL — ABNORMAL HIGH (ref 8.9–10.3)
Creatinine, Ser: 6.83 mg/dL — ABNORMAL HIGH (ref 0.61–1.24)
GFR calc Af Amer: 9 mL/min — ABNORMAL LOW (ref >60)
GFR, EST NON AFRICAN AMERICAN: 7 mL/min — AB (ref >60)
GLUCOSE: 75 mg/dL (ref 65–99)
POTASSIUM: 5.3 mmol/L — AB (ref 3.5–5.1)
Sodium: 138 mmol/L (ref 135–145)

## 2017-07-24 LAB — GLUCOSE, CAPILLARY
GLUCOSE-CAPILLARY: 74 mg/dL (ref 65–99)
Glucose-Capillary: 140 mg/dL — ABNORMAL HIGH (ref 65–99)
Glucose-Capillary: 134 mg/dL — ABNORMAL HIGH (ref 65–99)
Glucose-Capillary: 164 mg/dL — ABNORMAL HIGH (ref 65–99)

## 2017-07-24 LAB — HEMOGLOBIN A1C
Hgb A1c MFr Bld: 7.1 % — ABNORMAL HIGH (ref 4.8–5.6)
Mean Plasma Glucose: 157.07 mg/dL

## 2017-07-24 LAB — TSH: TSH: 2.344 u[IU]/mL (ref 0.350–4.500)

## 2017-07-24 LAB — PROTEIN, URINE, RANDOM: Total Protein, Urine: 1065 mg/dL

## 2017-07-24 LAB — PROTEIN / CREATININE RATIO, URINE
Creatinine, Urine: 67.25 mg/dL
Protein Creatinine Ratio: 12.57 mg/mg{Cre} — ABNORMAL HIGH (ref 0.00–0.15)
Total Protein, Urine: 845 mg/dL

## 2017-07-24 LAB — VITAMIN B12: Vitamin B-12: 321 pg/mL (ref 180–914)

## 2017-07-24 LAB — OSMOLALITY, URINE: OSMOLALITY UR: 366 mosm/kg (ref 300–900)

## 2017-07-24 LAB — ABO/RH: ABO/RH(D): A POS

## 2017-07-24 LAB — CK: Total CK: 28 U/L — ABNORMAL LOW (ref 49–397)

## 2017-07-24 MED ORDER — SODIUM CHLORIDE 0.9 % IV SOLN
Freq: Once | INTRAVENOUS | Status: AC
  Administered 2017-07-24: 16:00:00 via INTRAVENOUS

## 2017-07-24 NOTE — Progress Notes (Addendum)
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 The patient denies any recent injuries, trauma, or falls. Because of worsening lower back pain and colleagues pointing out his pallor, the patient presented to Dr. Ria Comment office for further evaluation on 07/23/17.   The patient was noted to have tachycardia and pallor, and was sent to the emergency department for further evaluation. 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 He states that he has had intermittent chest pain only when he is lifting or pushing some objects. He denies any fevers, chills, breath, nausea, vomiting, diarrhea, dysuria, hematuria. He has noted some left sided flank pain. He states that he has had to sleep in a recliner to find a comfortable position secondary to his back pain. CT of the abdomen and pelvis in the emergency department revealed compensatory left renal hypertrophy without hydronephrosis. There were lytic lesion lesions in the thoracic lumbar spine with a fracture of the left transverse process of L3. The patient was also noted to be in acute kidney injury with serum creatinine of 7.34.   Assessment/Plan: Acute kidney injury with solitary L-kidney -Secondary to volume depletion in the setting of NSAID and losartan use, but concern about myeloma kidney -unknown renal baseline -renal ultrasound -urine protein/creatinine ratio -SPEP -serum immunofixation -consult nephrology -continue IVF -D/C losartan and voltaren  Hypercalcemia with globulin gap -Presented with corrected calcium of 11.9 -patient has globulin gap of 4 -concerned about myeloma -continue IVFl3 t -Consult  medical oncology  Thrombocytopenia -Check serum B12 -Check TSH -Consult medical oncology -May need bone marrow biopsy  L3 Transverse Process Fracture with lytic thoracolumbar lytic lesions -concerned about pathologic fracture -skeletal survey if SPEP/IFE is positive -defer need for bone scan to medical oncology  Normocytic anemia -FOBT is negative -repeat FOBT -transfuse 2 units PRBC  Diabetes mellitus type 2 -Check hemoglobin A1c -NovoLog sliding scale -Holding glipizide, metformin, and invokana  Hyperlipidemia -Continue Pravachol -Check CPK  Hyperkalemia -due to AKI -anticipate improvement with IVF    Disposition Plan:   Home in 2-3 days  Family Communication:  No Family at bedside  Consultants:  Renal; medical oncology  Code Status:  DNR  DVT Prophylaxis:  SCDs   Procedures: As Listed in Progress Note Above  Antibiotics: None    Subjective:  patient complains of lower back pain. He denies any dysuria, hematuria, nausea, vomiting, diarrhea, abdominal pain. He presently does not have any chest pain, source breath, headache, coughing, hemoptysis.  Objective: Vitals:   07/23/17 2017 07/23/17 2122 07/24/17 0535 07/24/17 0550  BP:  136/63  (!) 152/64  Pulse:  94  87  Resp:  20  20  Temp:  98.8 F (37.1 C)  98.3 F (36.8 C)  TempSrc:  Oral  Oral  SpO2: 98% 99%  100%  Weight:  99.4 kg (219 lb 2.2 oz) 98.4 kg (216 lb 14.9 oz)   Height:        Intake/Output Summary (Last 24 hours) at 07/24/17 0806 Last data filed at 07/24/17 0553  Gross per 24 hour  Intake           960.33 ml  Output  500 ml  Net           460.33 ml   Weight change:  Exam:   General:  Pt is alert, follows commands appropriately, not in acute distress  HEENT: No icterus, No thrush, No neck mass, [place]/AT  Cardiovascular: RRR, S1/S2, no rubs, no gallops  Respiratory: CTA bilaterally, no wheezing, no crackles, no rhonchi  Abdomen: Soft/+BS, non tender, non  distended, no guarding  Extremities: No edema, No lymphangitis, No petechiae, No rashes, no synovitis  Neuro:  CN II-XII intact, strength 4/5 in RUE, RLE, strength 4/5 LUE, LLE; sensation intact bilateral; no dysmetria; babinski equivocal     Data Reviewed: I have personally reviewed following labs and imaging studies Basic Metabolic Panel:  Recent Labs Lab 07/23/17 1406 07/24/17 0507  NA 136 138  K 4.5 5.3*  CL 104 107  CO2 20* 20*  GLUCOSE 126* 75  BUN 53* 49*  CREATININE 7.34* 6.83*  CALCIUM 11.7* 11.1*   Liver Function Tests:  Recent Labs Lab 07/23/17 1406  AST 18  ALT 18  ALKPHOS 84  BILITOT 0.9  PROT 7.7  ALBUMIN 3.7    Recent Labs Lab 07/23/17 1406  LIPASE 29   No results for input(s): AMMONIA in the last 168 hours. Coagulation Profile: No results for input(s): INR, PROTIME in the last 168 hours. CBC:  Recent Labs Lab 07/23/17 1406 07/24/17 0507  WBC 8.2 7.7  HGB 7.9* 7.0*  HCT 23.0* 20.8*  MCV 92.0 92.9  PLT 56* 48*   Cardiac Enzymes:  Recent Labs Lab 07/23/17 1406  TROPONINI <0.03   BNP: Invalid input(s): POCBNP CBG:  Recent Labs Lab 07/23/17 2117 07/24/17 0729  GLUCAP 134* 74   HbA1C: No results for input(s): HGBA1C in the last 72 hours. Urine analysis:    Component Value Date/Time   COLORURINE STRAW (A) 07/23/2017 2251   APPEARANCEUR CLEAR 07/23/2017 2251   LABSPEC 1.014 07/23/2017 2251   PHURINE 6.0 07/23/2017 2251   GLUCOSEU >=500 (A) 07/23/2017 2251   HGBUR NEGATIVE 07/23/2017 2251   BILIRUBINUR NEGATIVE 07/23/2017 2251   KETONESUR NEGATIVE 07/23/2017 2251   PROTEINUR 100 (A) 07/23/2017 2251   NITRITE NEGATIVE 07/23/2017 2251   LEUKOCYTESUR NEGATIVE 07/23/2017 2251   Sepsis Labs: '@LABRCNTIP' (procalcitonin:4,lacticidven:4) )No results found for this or any previous visit (from the past 240 hour(s)).   Scheduled Meds: . aspirin EC  81 mg Oral q morning - 10a  . cyclobenzaprine  10 mg Oral QHS  . docusate  sodium  100 mg Oral BID  . Influenza vac split quadrivalent PF  0.5 mL Intramuscular Tomorrow-1000  . insulin aspart  0-15 Units Subcutaneous TID WC  . pravastatin  20 mg Oral q1800   Continuous Infusions: . sodium chloride 125 mL/hr at 07/24/17 0536    Procedures/Studies: Ct Abdomen Pelvis Wo Contrast  Result Date: 07/23/2017 CLINICAL DATA:  Mid chest on lower back pain with movement for 1 week, tachycardia, pale nose, history of renal cell carcinoma post RIGHT nephrectomy, diabetes mellitus, prior cholecystectomy EXAM: CT ABDOMEN AND PELVIS WITHOUT CONTRAST TECHNIQUE: Multidetector CT imaging of the abdomen and pelvis was performed following the standard protocol without IV contrast. Sagittal and coronal MPR images reconstructed from axial data set. No oral contrast was administered. COMPARISON:  CT chest abdomen pelvis 01/25/2009 FINDINGS: Lower chest: Linear subsegmental atelectasis in BILATERAL lower lobes. Hepatobiliary: Gallbladder surgically absent. No focal hepatic abnormality. Pancreas: Normal appearance Spleen: Normal appearance. Small splenule adjacent to medial border of spleen Adrenals/Urinary Tract: Adrenal  glands normal. Prior RIGHT nephrectomy. Enlarged LEFT kidney consistent with compensatory hypertrophy. Large cyst at inferior pole 10.4 x 7.3 x 6.5 cm increased since previous study when it measured 6.0 x 4.4 x 5.5 cm. Ureters and bladder unremarkable. Stomach/Bowel: Normal appendix. Redundant sigmoid loop. Stomach and bowel loops otherwise unremarkable. Vascular/Lymphatic: Atherosclerotic calcifications aorta, iliac arteries, and coronary arteries. Aorta normal caliber. No adenopathy. Reproductive: Unremarkable prostate gland and seminal vesicles Other: No free air or free fluid. Tiny inguinal hernias containing fat. No ventral hernia seen. Musculoskeletal: Scattered degenerative disc disease changes lower lumbar spine greatest at L3-L4. A number of question a subtle lytic lesions are  identified within thoracolumbar vertebra, subtle lytic metastases not excluded. Fracture identified at the LEFT transverse process of L3, new since 2010. IMPRESSION: Interval increase in size of a large cyst the inferior pole of the LEFT kidney 10.4 cm greatest size. Questionable subtle lytic lesions within thoracic and lumbar vertebra; consider follow-up MR assessment to exclude subtle lytic metastases. Interval fracture of the LEFT transverse process of L3 since 2010. Tiny BILATERAL inguinal hernias containing fat. Electronically Signed   By: Lavonia Dana M.D.   On: 07/23/2017 15:25   Dg Chest 2 View  Result Date: 07/23/2017 CLINICAL DATA:  Chest pain, tachycardia. EXAM: CHEST  2 VIEW COMPARISON:  Radiographs of February 24, 2004. FINDINGS: Stable cardiomediastinal silhouette. No pneumothorax or pleural effusion is noted. No acute pulmonary disease is noted. Multilevel degenerative disc disease is noted in the midthoracic spine. IMPRESSION: No active cardiopulmonary disease. Electronically Signed   By: Marijo Conception, M.D.   On: 07/23/2017 14:57    Elliette Seabolt, DO  [facility] Pager [phone]  If 7PM-7AM, please contact night-coverage [email or web address] Password [email or web address] 07/24/2017, 8:06 AM   LOS: 1 day

## 2017-07-24 NOTE — Consult Note (Signed)
[facility] Consultation Oncology  Name: Tom Marshall      MRN: [id number]    Location: A319/A319-01  Date: 07/24/2017 Time:10:07 AM   REFERRING PHYSICIAN:  Dr. Karmen Bongo  REASON FOR CONSULT:  Concern for multiple myeloma    DIAGNOSIS:  Anemia, thrombocytopenia, AKI, hypercalcemia, & bony lesions   HISTORY OF PRESENT ILLNESS:  Tom Marshall 66 y.o. male presented to [facility] ED for tachycardia, pallor, back pain & abdominal pain after being seen at his PCP's office.  Patient with reported h/o renal cell carcinoma s/p (R) nephrectomy.  In the ED, noted to have significantly elevated creatinine 7.34, with EGFR 7-8.  Anemia appreciated with Hgb 7.9 with thrombocytopenia (platelet count 56,000).  Calcium elevated 11.7 in ED as well.  CT abd/pelvis on 07/23/17 showed large left renal cyst measuring 10.4 cm, as well as subtle lytic lesions in thoracic and lumbar spine concerning for subtle lytic metastases.   He was admitted to inpatient hospitalist service at [facility] for ongoing evaluation and management.   Labs today (07/24/17) reveal progressive anemia with Hgb 7 g/dL and thrombocytopenia with platelet count 48,000.  BUN/CRE slightly improved at 49/6.83 today.  He is receiving IVF as inpatient; nephrology consult pending.    SPEP and kappa/lambda light chains have been collected and are in process at this time.      Of note: Code status is DNR/DNI.       PAST MEDICAL HISTORY:   Past Medical History:  Diagnosis Date  . Cancer of right kidney ([place])   . Cervical dystonia   . Diabetes mellitus without complication ([place])     ALLERGIES: No Known Allergies    MEDICATIONS: I have reviewed the patient's current medications.     PAST SURGICAL HISTORY Past Surgical History:  Procedure Laterality Date  . CHOLECYSTECTOMY  2007  . NEPHRECTOMY Right 1998   cancer    FAMILY HISTORY: Family History  Problem Relation Age of Onset  . Heart failure Mother 70  . Dementia  Father     SOCIAL HISTORY:  reports that he has never smoked. His smokeless tobacco use includes Chew. He reports that he does not drink alcohol or use drugs.    PHYSICAL EXAM: Most Recent Vital Signs: Blood pressure (!) 152/64, pulse 87, temperature 98.3 F (36.8 C), temperature source Oral, resp. rate 20, height 5' 9" (1.753 m), weight 216 lb 14.9 oz (98.4 kg), SpO2 100 %.   LABORATORY DATA:  Results for orders placed or performed during the hospital encounter of 07/23/17 (from the past 48 hour(s))  Basic metabolic panel     Status: Abnormal   Collection Time: 07/23/17  2:06 PM  Result Value Ref Range   Sodium 136 135 - 145 mmol/L   Potassium 4.5 3.5 - 5.1 mmol/L   Chloride 104 101 - 111 mmol/L   CO2 20 (L) 22 - 32 mmol/L   Glucose, Bld 126 (H) 65 - 99 mg/dL   BUN 53 (H) 6 - 20 mg/dL   Creatinine, Ser 7.34 (H) 0.61 - 1.24 mg/dL   Calcium 11.7 (H) 8.9 - 10.3 mg/dL   GFR calc non Af Amer 7 (L) >60 mL/min   GFR calc Af Amer 8 (L) >60 mL/min    Comment: (NOTE) The eGFR has been calculated using the CKD EPI equation. This calculation has not been validated in all clinical situations. eGFR's persistently <60 mL/min signify possible Chronic Kidney Disease.    Anion gap 12 5 - 15  CBC     Status: Abnormal   Collection Time: 07/23/17  2:06 PM  Result Value Ref Range   WBC 8.2 4.0 - 10.5 K/uL   RBC 2.50 (L) 4.22 - 5.81 MIL/uL   Hemoglobin 7.9 (L) 13.0 - 17.0 g/dL   HCT 23.0 (L) 39.0 - 52.0 %   MCV 92.0 78.0 - 100.0 fL   MCH 31.6 26.0 - 34.0 pg   MCHC 34.3 30.0 - 36.0 g/dL   RDW 15.6 (H) 11.5 - 15.5 %   Platelets 56 (L) 150 - 400 K/uL    Comment: PLATELET COUNT CONFIRMED BY SMEAR SPECIMEN CHECKED FOR CLOTS   Troponin I     Status: None   Collection Time: 07/23/17  2:06 PM  Result Value Ref Range   Troponin I <0.03 <0.03 ng/mL  Hepatic function panel     Status: None   Collection Time: 07/23/17  2:06 PM  Result Value Ref Range   Total Protein 7.7 6.5 - 8.1 g/dL    Albumin 3.7 3.5 - 5.0 g/dL   AST 18 15 - 41 U/L   ALT 18 17 - 63 U/L   Alkaline Phosphatase 84 38 - 126 U/L   Total Bilirubin 0.9 0.3 - 1.2 mg/dL   Bilirubin, Direct 0.1 0.1 - 0.5 mg/dL   Indirect Bilirubin 0.8 0.3 - 0.9 mg/dL  Lipase, blood     Status: None   Collection Time: 07/23/17  2:06 PM  Result Value Ref Range   Lipase 29 11 - 51 U/L  Brain natriuretic peptide     Status: None   Collection Time: 07/23/17  2:06 PM  Result Value Ref Range   B Natriuretic Peptide 45.0 0.0 - 100.0 pg/mL  Type and screen [facility]     Status: None   Collection Time: 07/23/17  3:02 PM  Result Value Ref Range   ABO/RH(D) A POS    Antibody Screen NEG    Sample Expiration 07/26/2017   Occult blood, poc device     Status: None   Collection Time: 07/23/17  3:54 PM  Result Value Ref Range   Fecal Occult Bld NEGATIVE NEGATIVE  Glucose, capillary     Status: Abnormal   Collection Time: 07/23/17  9:17 PM  Result Value Ref Range   Glucose-Capillary 134 (H) 65 - 99 mg/dL   Comment 1 Notify RN    Comment 2 Document in Chart   Na and K (sodium & potassium), rand urine     Status: None   Collection Time: 07/23/17 10:51 PM  Result Value Ref Range   Sodium, Ur 84 mmol/L   Potassium Urine 23 mmol/L  Protein, urine, random     Status: None   Collection Time: 07/23/17 10:51 PM  Result Value Ref Range   Total Protein, Urine 1,065 mg/dL    Comment: RESULTS CONFIRMED BY MANUAL DILUTION  Urinalysis, Routine w reflex microscopic     Status: Abnormal   Collection Time: 07/23/17 10:51 PM  Result Value Ref Range   Color, Urine STRAW (A) YELLOW   APPearance CLEAR CLEAR   Specific Gravity, Urine 1.014 1.005 - 1.030   pH 6.0 5.0 - 8.0   Glucose, UA >=500 (A) NEGATIVE mg/dL   Hgb urine dipstick NEGATIVE NEGATIVE   Bilirubin Urine NEGATIVE NEGATIVE   Ketones, ur NEGATIVE NEGATIVE mg/dL   Protein, ur 100 (A) NEGATIVE mg/dL   Nitrite NEGATIVE NEGATIVE   Leukocytes, UA NEGATIVE NEGATIVE   RBC / HPF  0-5  0 - 5 RBC/hpf   WBC, UA 0-5 0 - 5 WBC/hpf   Bacteria, UA NONE SEEN NONE SEEN   Squamous Epithelial / LPF NONE SEEN NONE SEEN   Mucus PRESENT   Basic metabolic panel     Status: Abnormal   Collection Time: 07/24/17  5:07 AM  Result Value Ref Range   Sodium 138 135 - 145 mmol/L   Potassium 5.3 (H) 3.5 - 5.1 mmol/L   Chloride 107 101 - 111 mmol/L   CO2 20 (L) 22 - 32 mmol/L   Glucose, Bld 75 65 - 99 mg/dL   BUN 49 (H) 6 - 20 mg/dL   Creatinine, Ser 6.83 (H) 0.61 - 1.24 mg/dL   Calcium 11.1 (H) 8.9 - 10.3 mg/dL   GFR calc non Af Amer 7 (L) >60 mL/min   GFR calc Af Amer 9 (L) >60 mL/min    Comment: (NOTE) The eGFR has been calculated using the CKD EPI equation. This calculation has not been validated in all clinical situations. eGFR's persistently <60 mL/min signify possible Chronic Kidney Disease.    Anion gap 11 5 - 15  CBC     Status: Abnormal   Collection Time: 07/24/17  5:07 AM  Result Value Ref Range   WBC 7.7 4.0 - 10.5 K/uL   RBC 2.24 (L) 4.22 - 5.81 MIL/uL   Hemoglobin 7.0 (L) 13.0 - 17.0 g/dL   HCT 20.8 (L) 39.0 - 52.0 %   MCV 92.9 78.0 - 100.0 fL   MCH 31.3 26.0 - 34.0 pg   MCHC 33.7 30.0 - 36.0 g/dL   RDW 15.9 (H) 11.5 - 15.5 %   Platelets 48 (L) 150 - 400 K/uL    Comment: SPECIMEN CHECKED FOR CLOTS PLATELET COUNT CONFIRMED BY SMEAR   Glucose, capillary     Status: None   Collection Time: 07/24/17  7:29 AM  Result Value Ref Range   Glucose-Capillary 74 65 - 99 mg/dL   Comment 1 Notify RN    Comment 2 Document in Chart       RADIOGRAPHY: Ct Abdomen Pelvis Wo Contrast  Result Date: 07/23/2017 CLINICAL DATA:  Mid chest on lower back pain with movement for 1 week, tachycardia, pale nose, history of renal cell carcinoma post RIGHT nephrectomy, diabetes mellitus, prior cholecystectomy EXAM: CT ABDOMEN AND PELVIS WITHOUT CONTRAST TECHNIQUE: Multidetector CT imaging of the abdomen and pelvis was performed following the standard protocol without IV contrast.  Sagittal and coronal MPR images reconstructed from axial data set. No oral contrast was administered. COMPARISON:  CT chest abdomen pelvis 01/25/2009 FINDINGS: Lower chest: Linear subsegmental atelectasis in BILATERAL lower lobes. Hepatobiliary: Gallbladder surgically absent. No focal hepatic abnormality. Pancreas: Normal appearance Spleen: Normal appearance. Small splenule adjacent to medial border of spleen Adrenals/Urinary Tract: Adrenal glands normal. Prior RIGHT nephrectomy. Enlarged LEFT kidney consistent with compensatory hypertrophy. Large cyst at inferior pole 10.4 x 7.3 x 6.5 cm increased since previous study when it measured 6.0 x 4.4 x 5.5 cm. Ureters and bladder unremarkable. Stomach/Bowel: Normal appendix. Redundant sigmoid loop. Stomach and bowel loops otherwise unremarkable. Vascular/Lymphatic: Atherosclerotic calcifications aorta, iliac arteries, and coronary arteries. Aorta normal caliber. No adenopathy. Reproductive: Unremarkable prostate gland and seminal vesicles Other: No free air or free fluid. Tiny inguinal hernias containing fat. No ventral hernia seen. Musculoskeletal: Scattered degenerative disc disease changes lower lumbar spine greatest at L3-L4. A number of question a subtle lytic lesions are identified within thoracolumbar vertebra, subtle lytic metastases not excluded. Fracture identified at  the LEFT transverse process of L3, new since 2010. IMPRESSION: Interval increase in size of a large cyst the inferior pole of the LEFT kidney 10.4 cm greatest size. Questionable subtle lytic lesions within thoracic and lumbar vertebra; consider follow-up MR assessment to exclude subtle lytic metastases. Interval fracture of the LEFT transverse process of L3 since 2010. Tiny BILATERAL inguinal hernias containing fat. Electronically Signed   By: Lavonia Dana M.D.   On: 07/23/2017 15:25   Dg Chest 2 View  Result Date: 07/23/2017 CLINICAL DATA:  Chest pain, tachycardia. EXAM: CHEST  2 VIEW  COMPARISON:  Radiographs of February 24, 2004. FINDINGS: Stable cardiomediastinal silhouette. No pneumothorax or pleural effusion is noted. No acute pulmonary disease is noted. Multilevel degenerative disc disease is noted in the midthoracic spine. IMPRESSION: No active cardiopulmonary disease. Electronically Signed   By: Marijo Conception, M.D.   On: 07/23/2017 14:57       PATHOLOGY:     ASSESSMENT & PLAN:   Concern for multiple myeloma:  -Given "CRAB" symptoms of hypercalcemia, renal dysfunction, anemia, and bone lesions, multiple myeloma is a strong clinical concern. Total serum protein is normal.  SPEP and kappa/lambda light chains are pending. Would also collect immunofixation (IFE), IgG/IgA/IgM immunoglobulins, & LDH as well.  If monoclonal protein is present, then would consider obtaining bone marrow biopsy (which can be collected as an outpatient).  If suspected multiple myeloma is confirmed, then we would also obtain whole body PET scan as an outpatient as well.  -He does have history of renal cell carcinoma, which can certainly metastasize to bone.  Would consider MRI thoracic and lumbar spine for further evaluation of noted subtle lytic lesions on recent CT abd/pelvis. Patients with lytic bone lesions, small M-spike, and fewer than 10% clonal plasma cells on bone marrow are more likely to have metastatic carcinoma with unrelated MGUS, rather than multiple myeloma.  SPEP/IFE and additional imaging will be helpful as well.   -Would obtain skeletal survey imaging for extent of lytic lesions, which can be done as inpatient. This would help determine extent of lytic lesions elsewhere.   Anemia/Thrombocytopenia:  -Would transfuse 2 units PRBCs for Hgb < 8 g/dL or for symptomatic anemia.   -Transfuse platelets for plts <20,000 or active bleeding.  Continue SCDs for DVT prophylaxis and avoid heparin products.      Dispo:  -Obtain IFE, IgG/IgA/IgM immunoglobulins, & LDH lab studies.  -Obtain  skeletal survey imaging for evaluation of extent of lytic lesions.  -Consider MRI thoracic and lumbar spine for further evaluation of bone lesions.  -If monoclonal antibody is confirmed, then we can obtain bone marrow biopsy and whole body PET scan as an outpatient.  Please contact cancer center when patient is due for discharge from hospital, and we will make arrangements for him to see Dr. Talbert Cage for initial consultation as soon as able.     The patient and plan discussed with Dr. Talbert Cage and he is in agreement with the aforementioned.  This patient was not formally seen/examined. Recommendations made based on extensive chart review; no charge for inpatient oncology consultation.     Mike Craze, NP  [place] [phone]

## 2017-07-24 NOTE — Care Management Note (Signed)
Case Management Note  Patient Details  Name: Tom Marshall MRN: [id number] Date of Birth: 02-01-1951  Subjective/Objective:                  Pt admitted with renal failure. Pt from home, ind, not homebound. Has PCP, transportation and insurance with drug coverage. He has no HH or DME needs pta. He communicates no needs or concerns about discharging home with self care.   Action/Plan: Pt plans to return home with self care. CM will follow to DC. No CM needs anticipated.  Expected Discharge Date:  07/24/17               Expected Discharge Plan:  Home/Self Care  In-House Referral:  NA  Discharge planning Services  CM Consult  Post Acute Care Choice:  NA Choice offered to:  NA  Status of Service:  Completed, signed off   Sherald Barge, RN 07/24/2017, 10:57 AM

## 2017-07-24 NOTE — Consult Note (Signed)
Urology Consult  Referring physician: Dr. Carles Collet Reason for referral: left renal mass, renal failure  Chief Complaint: fatigue, left abdominal pain  History of Present Illness: Tom Marshall is a hx of Right renal RCC s/p radical nephrectomy by Dr. Diona Fanti several years ago who presented to the ER with fatigue and left abdominal pain. He denies any lower urinary tract symptoms. Creatinine was 7.4 and pt only made 500cc of urine with gentle hydration. His protein creatinine ratio was 12. CT showed a hypertrophied left kidney with a large lower pole cyst. No hydronephrosis. He has multiple lytic bone lesions on the CT scan. Since admission his creatinine has improved to 6.8 and he had made over 1L of urine. His left abdominal pain is dull, intermittent, mild and nonraditing. No exacerbating/alleviating events. Pain is currently 1 out of 10.   Past Medical History:  Diagnosis Date  . Cancer of right kidney ([place])   . Cervical dystonia   . Diabetes mellitus without complication [facility])    Past Surgical History:  Procedure Laterality Date  . CHOLECYSTECTOMY  2007  . NEPHRECTOMY Right 1998   cancer    Medications: I have reviewed the patient's current medications. Allergies: No Known Allergies  Family History  Problem Relation Age of Onset  . Heart failure Mother 6  . Dementia Father    Social History:  reports that he has never smoked. His smokeless tobacco use includes Chew. He reports that he does not drink alcohol or use drugs.  Review of Systems  Constitutional: Positive for malaise/fatigue and weight loss.  Gastrointestinal: Positive for nausea.  Neurological: Positive for weakness.  All other systems reviewed and are negative.   Physical Exam:  Vital signs in last 24 hours: Temp:  [98.3 F (36.8 C)-99.3 F (37.4 C)] 99.3 F (37.4 C) (09/26 1815) Pulse Rate:  [85-94] 86 (09/26 1815) Resp:  [16-20] 16 (09/26 1815) BP: (133-152)/(61-64) 140/61 (09/26 1815) SpO2:  [98 %-100 %] 98  % (09/26 1815) Weight:  [98.4 kg (216 lb 14.9 oz)-99.4 kg (219 lb 2.2 oz)] 98.4 kg (216 lb 14.9 oz) (09/26 0535) Physical Exam  Constitutional: He is oriented to person, place, and time. He appears well-developed and well-nourished.  HENT:  Head: Normocephalic and atraumatic.  Eyes: Pupils are equal, round, and reactive to light. EOM are normal.  Neck: Normal range of motion. No thyromegaly present.  Cardiovascular: Normal rate and regular rhythm.   Respiratory: Effort normal. No respiratory distress.  GI: Soft. He exhibits no distension and no mass. There is no tenderness. There is no rebound and no guarding.  Genitourinary: Penis normal. No penile tenderness.  Musculoskeletal: Normal range of motion. He exhibits no edema.  Neurological: He is alert and oriented to person, place, and time.  Skin: Skin is warm and dry.  Psychiatric: He has a normal mood and affect. His behavior is normal. Judgment and thought content normal.    Laboratory Data:  Results for orders placed or performed during the hospital encounter of 07/23/17 (from the past 72 hour(s))  Basic metabolic panel     Status: Abnormal   Collection Time: 07/23/17  2:06 PM  Result Value Ref Range   Sodium 136 135 - 145 mmol/L   Potassium 4.5 3.5 - 5.1 mmol/L   Chloride 104 101 - 111 mmol/L   CO2 20 (L) 22 - 32 mmol/L   Glucose, Bld 126 (H) 65 - 99 mg/dL   BUN 53 (H) 6 - 20 mg/dL   Creatinine, Ser 7.34 (  H) 0.61 - 1.24 mg/dL   Calcium 11.7 (H) 8.9 - 10.3 mg/dL   GFR calc non Af Amer 7 (L) >60 mL/min   GFR calc Af Amer 8 (L) >60 mL/min    Comment: (NOTE) The eGFR has been calculated using the CKD EPI equation. This calculation has not been validated in all clinical situations. eGFR's persistently <60 mL/min signify possible Chronic Kidney Disease.    Anion gap 12 5 - 15  CBC     Status: Abnormal   Collection Time: 07/23/17  2:06 PM  Result Value Ref Range   WBC 8.2 4.0 - 10.5 K/uL   RBC 2.50 (L) 4.22 - 5.81 MIL/uL    Hemoglobin 7.9 (L) 13.0 - 17.0 g/dL   HCT 23.0 (L) 39.0 - 52.0 %   MCV 92.0 78.0 - 100.0 fL   MCH 31.6 26.0 - 34.0 pg   MCHC 34.3 30.0 - 36.0 g/dL   RDW 15.6 (H) 11.5 - 15.5 %   Platelets 56 (L) 150 - 400 K/uL    Comment: PLATELET COUNT CONFIRMED BY SMEAR SPECIMEN CHECKED FOR CLOTS   Troponin I     Status: None   Collection Time: 07/23/17  2:06 PM  Result Value Ref Range   Troponin I <0.03 <0.03 ng/mL  Hepatic function panel     Status: None   Collection Time: 07/23/17  2:06 PM  Result Value Ref Range   Total Protein 7.7 6.5 - 8.1 g/dL   Albumin 3.7 3.5 - 5.0 g/dL   AST 18 15 - 41 U/L   ALT 18 17 - 63 U/L   Alkaline Phosphatase 84 38 - 126 U/L   Total Bilirubin 0.9 0.3 - 1.2 mg/dL   Bilirubin, Direct 0.1 0.1 - 0.5 mg/dL   Indirect Bilirubin 0.8 0.3 - 0.9 mg/dL  Lipase, blood     Status: None   Collection Time: 07/23/17  2:06 PM  Result Value Ref Range   Lipase 29 11 - 51 U/L  Brain natriuretic peptide     Status: None   Collection Time: 07/23/17  2:06 PM  Result Value Ref Range   B Natriuretic Peptide 45.0 0.0 - 100.0 pg/mL  Type and screen [facility]     Status: None (Preliminary result)   Collection Time: 07/23/17  3:02 PM  Result Value Ref Range   ABO/RH(D) A POS    Antibody Screen NEG    Sample Expiration 07/26/2017    Unit Number [id number]    Blood Component Type RCLI PHER 2    Unit division 00    Status of Unit ISSUED    Transfusion Status OK TO TRANSFUSE    Crossmatch Result Compatible    Unit Number [id number]    Blood Component Type RBC, LR IRR    Unit division 00    Status of Unit ISSUED    Transfusion Status OK TO TRANSFUSE    Crossmatch Result Compatible   Prepare RBC     Status: None   Collection Time: 07/23/17  3:02 PM  Result Value Ref Range   Order Confirmation ORDER PROCESSED BY BLOOD BANK   ABO/Rh     Status: None   Collection Time: 07/23/17  3:02 PM  Result Value Ref Range   ABO/RH(D) A POS   Occult blood, poc device      Status: None   Collection Time: 07/23/17  3:54 PM  Result Value Ref Range   Fecal Occult Bld NEGATIVE NEGATIVE  Glucose, capillary  Status: Abnormal   Collection Time: 07/23/17  9:17 PM  Result Value Ref Range   Glucose-Capillary 134 (H) 65 - 99 mg/dL   Comment 1 Notify RN    Comment 2 Document in Chart   Na and K (sodium & potassium), rand urine     Status: None   Collection Time: 07/23/17 10:51 PM  Result Value Ref Range   Sodium, Ur 84 mmol/L   Potassium Urine 23 mmol/L  Osmolality, urine     Status: None   Collection Time: 07/23/17 10:51 PM  Result Value Ref Range   Osmolality, Ur 366 300 - 900 mOsm/kg    Comment: Performed at [facility] [address], [place], [place] [address]  Protein, urine, random     Status: None   Collection Time: 07/23/17 10:51 PM  Result Value Ref Range   Total Protein, Urine 1,065 mg/dL    Comment: RESULTS CONFIRMED BY MANUAL DILUTION  Urinalysis, Routine w reflex microscopic     Status: Abnormal   Collection Time: 07/23/17 10:51 PM  Result Value Ref Range   Color, Urine STRAW (A) YELLOW   APPearance CLEAR CLEAR   Specific Gravity, Urine 1.014 1.005 - 1.030   pH 6.0 5.0 - 8.0   Glucose, UA >=500 (A) NEGATIVE mg/dL   Hgb urine dipstick NEGATIVE NEGATIVE   Bilirubin Urine NEGATIVE NEGATIVE   Ketones, ur NEGATIVE NEGATIVE mg/dL   Protein, ur 100 (A) NEGATIVE mg/dL   Nitrite NEGATIVE NEGATIVE   Leukocytes, UA NEGATIVE NEGATIVE   RBC / HPF 0-5 0 - 5 RBC/hpf   WBC, UA 0-5 0 - 5 WBC/hpf   Bacteria, UA NONE SEEN NONE SEEN   Squamous Epithelial / LPF NONE SEEN NONE SEEN   Mucus PRESENT   Protein / creatinine ratio, urine     Status: Abnormal   Collection Time: 07/23/17 11:59 PM  Result Value Ref Range   Creatinine, Urine 67.25 mg/dL   Total Protein, Urine 845 mg/dL    Comment: RESULTS CONFIRMED BY MANUAL DILUTION NO NORMAL RANGE ESTABLISHED FOR THIS TEST    Protein Creatinine Ratio 12.57 (H) 0.00 - 0.15 mg/mg[Cre]  Basic  metabolic panel     Status: Abnormal   Collection Time: 07/24/17  5:07 AM  Result Value Ref Range   Sodium 138 135 - 145 mmol/L   Potassium 5.3 (H) 3.5 - 5.1 mmol/L   Chloride 107 101 - 111 mmol/L   CO2 20 (L) 22 - 32 mmol/L   Glucose, Bld 75 65 - 99 mg/dL   BUN 49 (H) 6 - 20 mg/dL   Creatinine, Ser 6.83 (H) 0.61 - 1.24 mg/dL   Calcium 11.1 (H) 8.9 - 10.3 mg/dL   GFR calc non Af Amer 7 (L) >60 mL/min   GFR calc Af Amer 9 (L) >60 mL/min    Comment: (NOTE) The eGFR has been calculated using the CKD EPI equation. This calculation has not been validated in all clinical situations. eGFR's persistently <60 mL/min signify possible Chronic Kidney Disease.    Anion gap 11 5 - 15  CBC     Status: Abnormal   Collection Time: 07/24/17  5:07 AM  Result Value Ref Range   WBC 7.7 4.0 - 10.5 K/uL   RBC 2.24 (L) 4.22 - 5.81 MIL/uL   Hemoglobin 7.0 (L) 13.0 - 17.0 g/dL   HCT 20.8 (L) 39.0 - 52.0 %   MCV 92.9 78.0 - 100.0 fL   MCH 31.3 26.0 - 34.0 pg  MCHC 33.7 30.0 - 36.0 g/dL   RDW 15.9 (H) 11.5 - 15.5 %   Platelets 48 (L) 150 - 400 K/uL    Comment: SPECIMEN CHECKED FOR CLOTS PLATELET COUNT CONFIRMED BY SMEAR   Hemoglobin A1c     Status: Abnormal   Collection Time: 07/24/17  5:07 AM  Result Value Ref Range   Hgb A1c MFr Bld 7.1 (H) 4.8 - 5.6 %    Comment: (NOTE) Pre diabetes:          5.7%-6.4% Diabetes:              >6.4% Glycemic control for   <7.0% adults with diabetes    Mean Plasma Glucose 157.07 mg/dL    Comment: Performed at [facility], [place] [address] [place], [place] [address]  Glucose, capillary     Status: None   Collection Time: 07/24/17  7:29 AM  Result Value Ref Range   Glucose-Capillary 74 65 - 99 mg/dL   Comment 1 Notify RN    Comment 2 Document in Chart   TSH     Status: None   Collection Time: 07/24/17 10:03 AM  Result Value Ref Range   TSH 2.344 0.350 - 4.500 uIU/mL    Comment: Performed by a 3rd Generation assay with a functional sensitivity of  <=0.01 uIU/mL.  Vitamin B12     Status: None   Collection Time: 07/24/17 10:03 AM  Result Value Ref Range   Vitamin B-12 321 180 - 914 pg/mL    Comment: (NOTE) This assay is not validated for testing neonatal or myeloproliferative syndrome specimens for Vitamin B12 levels. Performed at [facility], [place] [address], [place], [place] [address]   CK     Status: Abnormal   Collection Time: 07/24/17 10:03 AM  Result Value Ref Range   Total CK 28 (L) 49 - 397 U/L  Glucose, capillary     Status: Abnormal   Collection Time: 07/24/17 11:43 AM  Result Value Ref Range   Glucose-Capillary 140 (H) 65 - 99 mg/dL   Comment 1 Notify RN    Comment 2 Document in Chart   Glucose, capillary     Status: Abnormal   Collection Time: 07/24/17  4:49 PM  Result Value Ref Range   Glucose-Capillary 134 (H) 65 - 99 mg/dL   Comment 1 Notify RN    Comment 2 Document in Chart    No results found for this or any previous visit (from the past 240 hour(s)). Creatinine:  Recent Labs  07/23/17 1406 07/24/17 0507  CREATININE 7.34* 6.83*   Baseline Creatinine: 1.2  Impression/Assessment:  66yo with left renal cyst, acute renal failure  Plan:  1. Renal cyst: The renal cyst id stable in appearance and is not obstructing the left ureter. The patient would not benefit from ureteral stent placement. The cyst does not represent a malignancy so he does not need any further workup for the cyst at this time. 2. Acute renal failure: Please continue gentle hydration and serial creatinines. The renal failure is likely related to prerenal versus renal process. Given his bone lesions there is concern for multiple myeloma as the cause of his renal failure. Workup per nephrology  Nicolette Bang 07/24/2017, 7:53 PM

## 2017-07-24 NOTE — Consult Note (Signed)
TRAVARES NELLES MRN: [id number] DOB/AGE: 03/24/1951 66 y.o. Primary Care Physician:Fagan, Carloyn Manner, MD Admit date: 07/23/2017 Chief Complaint:  Chief Complaint  Patient presents with  . Chest Pain  . Tachycardia   HPI: Pt is  a 66 year old  male with past medical history significant of DM, cervical dystonia, and R renal cell CA s/p nephrectomy who  presented   To ER with c/o pain.  HPI  dates back to 1 weeks when pt started having  mid chest pain and lower back pain with movement.Pain iPt says n his back is lower left back and radiates into flank and left lower abdomen.    It was sudden in onset, inability to  sleep in the bed.  Pt later went to a meeting  On Monday night and was told he was pale.  Pt works as an Clinical cytogeneticist and he was too tired to keep working after about an hour; .   Upon evalution in ER pt was found to have  Acute renal failure and anemia.pt was admitted for further tx. Pt seen today on 3rd floor. Pt ofrers no c/o Shortness Of Breath.   Pt offers  No c/o dysuria or hematuria.  Pt offers no c/o cough/fever.chills Pt main concern is back ache with movement    Past Medical History:  Diagnosis Date  . Cancer of right kidney ([place])   . Cervical dystonia   . Diabetes mellitus without complication ([place])         Family History  Problem Relation Age of Onset  . Heart failure Mother 65  . Dementia Father     Social History:  reports that he has never smoked. His smokeless tobacco use includes Chew. He reports that he does not drink alcohol or use drugs.   Allergies: No Known Allergies  Medications Prior to Admission  Medication Sig Dispense Refill  . allopurinol (ZYLOPRIM) 300 MG tablet Take 300 mg by mouth every morning.    [name] aspirin EC 81 MG tablet Take 81 mg by mouth every morning.    . canagliflozin (INVOKANA) 300 MG TABS tablet Take 300 mg by mouth every morning.    . cyclobenzaprine (FLEXERIL) 10 MG tablet Take 10 mg by mouth at bedtime.    [name] glipiZIDE  (GLUCOTROL XL) 5 MG 24 hr tablet Take 5 mg by mouth every morning.    [name] losartan (COZAAR) 100 MG tablet Take 100 mg by mouth every morning.    . metFORMIN (GLUCOPHAGE-XR) 500 MG 24 hr tablet Take 1,000 mg by mouth 2 (two) times daily.    . Multiple Vitamins-Minerals (CENTRUM SILVER 50+MEN) TABS Take 1 tablet by mouth every morning.    . pravastatin (PRAVACHOL) 20 MG tablet Take 20 mg by mouth every morning.         ZCH:YIFOY from the symptoms mentioned above,there are no other symptoms referable to all systems reviewed.  [name] aspirin EC  81 mg Oral q morning - 10a  . cyclobenzaprine  10 mg Oral QHS  . docusate sodium  100 mg Oral BID  . Influenza vac split quadrivalent PF  0.5 mL Intramuscular Tomorrow-1000  . insulin aspart  0-15 Units Subcutaneous TID WC  . pravastatin  20 mg Oral q1800        Physical Exam: Vital signs in last 24 hours: Temp:  [98.3 F (36.8 C)-98.8 F (37.1 C)] 98.3 F (36.8 C) (09/26 0550) Pulse Rate:  [87-123] 87 (09/26 0550) Resp:  [15-20] 20 (09/26 0550) BP: (117-164)/(53-75)  152/64 (09/26 0550) SpO2:  [96 %-100 %] 100 % (09/26 0550) Weight:  [216 lb 14.9 oz (98.4 kg)-219 lb 2.2 oz (99.4 kg)] 216 lb 14.9 oz (98.4 kg) (09/26 0535) Weight change:  Last BM Date: 07/23/17  Intake/Output from previous day: 09/25 0701 - 09/26 0700 In: 960.3 [I.V.:960.3] Out: 500 [Urine:500] Total I/O In: -  Out: 400 [Urine:400]   Physical Exam: General- pt is awake,alert, oriented to time place and person Resp- No acute REsp distress, CTA B/L NO Rhonchi CVS- S1S2 regular in rate and rhythm GIT- BS+, soft, NT, NDobese EXT- NO LE Edema, Cyanosis CNS- CN 2-12 grossly intact. Moving all 4 extremities Psych- normal mood and affect    Lab Results: CBC  Recent Labs  07/23/17 1406 07/24/17 0507  WBC 8.2 7.7  HGB 7.9* 7.0*  HCT 23.0* 20.8*  PLT 56* 48*    BMET  Recent Labs  07/23/17 1406 07/24/17 0507  NA 136 138  K 4.5 5.3*  CL 104 107  CO2 20*  20*  GLUCOSE 126* 75  BUN 53* 49*  CREATININE 7.34* 6.83*  CALCIUM 11.7* 11.1*    Creat trend 2018  7.3=> 6.83   MICRO No results found for this or any previous visit (from the past 240 hour(s)).    Lab Results  Component Value Date   CALCIUM 11.1 (H) 07/24/2017   CT done in 2003 1.  RIGHT NEPHRECTOMY WITH COMPENSATORY HYPERTROPHY OF THE LEFT KIDNEY WITH STABLE 3.5 CM EXOPHYTIC LOWER POLE LEFT RENAL CYST.   U/s done in 2006 IMPRESSION:  Right nephrectomy. Left renal cyst, 4.5 cm diameter   CT scan done in  2018 RIGHTnephrectomy. Enlarged LEFT kidney consistent with compensatory hypertrophy. Large cyst at inferior pole 10.4 x 7.3 x 6.5 cm increased since previous study when it measured 6.0 x 4.4 x 5.5 cm.  Impression: 1)Renal  AKI secondary to Prerenal/ATN/Hypercalcemia                AKI sec to high cal causing afferent vasoconstriction                ARB on board               NSAIDS on board               AKI on CKD               CKD stage 3.               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     Cyst has grown  3.5 cm in  2003 to now 10.5 cm approx      9) pain- most likley sec to lytic lesions  Plan:   Agree with IVF for hypercalcemia. Pt  will need lasix once in positive + fluid balance. Agree with oncology help Will suggest that pt will benefit from urology evaluation for possible RCC esp with increasing Cyst size in view of past medical hx of RCC.  Pt with RCC also develop  lytic lesions Agree with Upep ansd spep Will ask for PTH, PTHrp and vitamin D levels      Shantice Menger S 07/24/2017, 9:24 AM

## 2017-07-25 LAB — PROTEIN ELECTROPHORESIS, SERUM
A/G Ratio: 0.9 (ref 0.7–1.7)
ALBUMIN ELP: 3.2 g/dL (ref 2.9–4.4)
ALPHA-1-GLOBULIN: 0.2 g/dL (ref 0.0–0.4)
Alpha-2-Globulin: 0.9 g/dL (ref 0.4–1.0)
Beta Globulin: 2 g/dL — ABNORMAL HIGH (ref 0.7–1.3)
GLOBULIN, TOTAL: 3.4 g/dL (ref 2.2–3.9)
Gamma Globulin: 0.2 g/dL — ABNORMAL LOW (ref 0.4–1.8)
M-Spike, %: 0.4 g/dL — ABNORMAL HIGH
TOTAL PROTEIN ELP: 6.6 g/dL (ref 6.0–8.5)

## 2017-07-25 LAB — BPAM RBC
BLOOD PRODUCT EXPIRATION DATE: 201809302359
BLOOD PRODUCT EXPIRATION DATE: 201809302359
ISSUE DATE / TIME: 201809261600
ISSUE DATE / TIME: 201809261944
UNIT TYPE AND RH: 5100
Unit Type and Rh: 5100

## 2017-07-25 LAB — GLUCOSE, CAPILLARY
GLUCOSE-CAPILLARY: 115 mg/dL — AB (ref 65–99)
Glucose-Capillary: 124 mg/dL — ABNORMAL HIGH (ref 65–99)
Glucose-Capillary: 147 mg/dL — ABNORMAL HIGH (ref 65–99)
Glucose-Capillary: 178 mg/dL — ABNORMAL HIGH (ref 65–99)

## 2017-07-25 LAB — TYPE AND SCREEN
ABO/RH(D): A POS
Antibody Screen: NEGATIVE
UNIT DIVISION: 0
Unit division: 0

## 2017-07-25 LAB — CBC
HCT: 25.3 % — ABNORMAL LOW (ref 39.0–52.0)
Hemoglobin: 8.7 g/dL — ABNORMAL LOW (ref 13.0–17.0)
MCH: 31.1 pg (ref 26.0–34.0)
MCHC: 34.4 g/dL (ref 30.0–36.0)
MCV: 90.4 fL (ref 78.0–100.0)
PLATELETS: 41 10*3/uL — AB (ref 150–400)
RBC: 2.8 MIL/uL — ABNORMAL LOW (ref 4.22–5.81)
RDW: 16.8 % — AB (ref 11.5–15.5)
WBC: 8.3 10*3/uL (ref 4.0–10.5)

## 2017-07-25 LAB — IMMUNOFIXATION ELECTROPHORESIS
IGA: 788 mg/dL — AB (ref 61–437)
IGG (IMMUNOGLOBIN G), SERUM: 261 mg/dL — AB (ref 700–1600)
IGM (IMMUNOGLOBULIN M), SRM: 17 mg/dL — AB (ref 20–172)
TOTAL PROTEIN ELP: 6.8 g/dL (ref 6.0–8.5)

## 2017-07-25 LAB — RENAL FUNCTION PANEL
ALBUMIN: 3.4 g/dL — AB (ref 3.5–5.0)
Anion gap: 14 (ref 5–15)
BUN: 52 mg/dL — AB (ref 6–20)
CALCIUM: 11 mg/dL — AB (ref 8.9–10.3)
CO2: 17 mmol/L — ABNORMAL LOW (ref 22–32)
CREATININE: 6.78 mg/dL — AB (ref 0.61–1.24)
Chloride: 108 mmol/L (ref 101–111)
GFR calc Af Amer: 9 mL/min — ABNORMAL LOW (ref >60)
GFR calc non Af Amer: 7 mL/min — ABNORMAL LOW (ref >60)
GLUCOSE: 116 mg/dL — AB (ref 65–99)
PHOSPHORUS: 6.3 mg/dL — AB (ref 2.5–4.6)
Potassium: 5.1 mmol/L (ref 3.5–5.1)
SODIUM: 139 mmol/L (ref 135–145)

## 2017-07-25 LAB — HEMOGLOBIN A1C
HEMOGLOBIN A1C: 6.5 % — AB (ref 4.8–5.6)
MEAN PLASMA GLUCOSE: 139.85 mg/dL

## 2017-07-25 LAB — IMMUNOFIXATION, URINE

## 2017-07-25 LAB — KAPPA/LAMBDA LIGHT CHAINS
Kappa free light chain: 14886.4 mg/L — ABNORMAL HIGH (ref 3.3–19.4)
Kappa, lambda light chain ratio: 1459.45 — ABNORMAL HIGH (ref 0.26–1.65)
Lambda free light chains: 10.2 mg/L (ref 5.7–26.3)

## 2017-07-25 LAB — LACTATE DEHYDROGENASE: LDH: 182 U/L (ref 98–192)

## 2017-07-25 MED ORDER — FUROSEMIDE 10 MG/ML IJ SOLN
40.0000 mg | Freq: Two times a day (BID) | INTRAMUSCULAR | Status: DC
  Administered 2017-07-25 (×2): 40 mg via INTRAVENOUS
  Filled 2017-07-25 (×3): qty 4

## 2017-07-25 NOTE — Progress Notes (Signed)
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 The patient denies any recent injuries, trauma, or falls. Because of worsening lower back pain and colleagues pointing out his pallor, the patient presented to Dr. Ria Comment office for further evaluation on 07/23/17.   The patient was noted to have tachycardia and pallor, and was sent to the emergency department for further evaluation. 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 He states that he has had intermittent chest pain only when he is lifting or pushing some objects. He denies any fevers, chills, breath, nausea, vomiting, diarrhea, dysuria, hematuria. He has noted some left sided flank pain. He states that he has had to sleep in a recliner to find a comfortable position secondary to his back pain. CT of the abdomen and pelvis in the emergency department revealed compensatory left renal hypertrophy without hydronephrosis. There were lytic lesion lesions in the thoracic lumbar spine with a fracture of the left transverse process of L3. The patient was also noted to be in acute kidney injury with serum creatinine of 7.34.   Assessment/Plan: Acute kidney injury with solitary L-kidney -Secondary to volume depletion in the setting of NSAID and losartan use, but concern about myeloma kidney -baseline creatinine 0.8-1.0 (july 2018) -renal ultrasound--no hydronephrosis of solitary left kidney, large left cyst -urine protein/creatinine ratio--12.57--mostly monoclonal protein -SPEP--results pending -serum immunofixation--shows elevated IgA monoclonal protein with kappa light chain specificity -urine IFE-->Bence Jones  Protein positive; kappa type. -consulted nephrology -continue IVF and lasix per nephrology -D/C losartan and voltaren  Hypercalcemia with globulin gap/Elevated monoclonal protein -Presented with corrected calcium of 11.9 -patient has globulin gap of 4 -concerned about myeloma -serum IFE shows elevated IgA monoclonal protein with kappa light chain specificity -urine IFE--Bence Jones Protein positive; kappa type. -continue IVF -medical oncology consult appreciated -will need outpatient BM biopsy and whole body PET -LDH 182 -check PTHrp  Thrombocytopenia -Check serum B12--321 -Check TSH--2.344 -medical oncology consult appreciated -Will need bone marrow biopsy after d/c  L3 Transverse Process Fracture with lytic thoracolumbar lytic lesions -concerned about pathologic fracture -MRI could not be done due to pt's body habitus--can be deferred as pt will like have whole body PET after d/c and pt without any signs of radiculopathy -defer need for bone scan to medical oncology  Normocytic anemia -FOBT is negative -repeat FOBT -transfused 2 units PRBC -am CBC  Diabetes mellitus type 2 -Check hemoglobin A1c-6.5 -NovoLog sliding scale -Holding glipizide, metformin, and invokana  Hyperlipidemia -Continue Pravachol -Check CPK--28  Hyperkalemia -due to AKI -improved with IVF    Disposition Plan:   Home when cleared by nephrology Family Communication:  Sister updated at bedside 9/27--Total time spent 40 minutes.  Greater than 50% spent face to face counseling and coordinating care.   Consultants:  Renal; medical oncology, urology  Code Status:  DNR  DVT Prophylaxis:  SCDs   Procedures: As Listed in Progress Note Above  Antibiotics: None   Subjective: Patient is feeling better. He says his back pain is controlled. He denies any radicular type symptoms. He denies any weakness in his legs. He denies any fevers, chills, chest pain, short of breath,  nausea, vomiting, diarrhea,  abdominal pain. No headache or neck pain.  Objective: Vitals:   07/24/17 2102 07/24/17 2248 07/25/17 0548 07/25/17 1516  BP:  132/62 130/62 138/70  Pulse:  80 86 85  Resp:  '18 18 18  ' Temp:  98.6 F (37 C) 98.5 F (36.9 C) 98.9 F (37.2 C)  TempSrc:  Oral Oral Oral  SpO2: 97% 98% 99% 97%  Weight:      Height:        Intake/Output Summary (Last 24 hours) at 07/25/17 1817 Last data filed at 07/25/17 1504  Gross per 24 hour  Intake          2658.92 ml  Output             1650 ml  Net          1008.92 ml   Weight change:  Exam:   General:  Pt is alert, follows commands appropriately, not in acute distress  HEENT: No icterus, No thrush, No neck mass, [place]/AT  Cardiovascular: RRR, S1/S2, no rubs, no gallops  Respiratory: CTA bilaterally, no wheezing, no crackles, no rhonchi  Abdomen: Soft/+BS, non tender, non distended, no guarding  Extremities: No edema, No lymphangitis, No petechiae, No rashes, no synovitis   Data Reviewed: I have personally reviewed following labs and imaging studies Basic Metabolic Panel:  Recent Labs Lab 07/23/17 1406 07/24/17 0507 07/25/17 0448  NA 136 138 139  K 4.5 5.3* 5.1  CL 104 107 108  CO2 20* 20* 17*  GLUCOSE 126* 75 116*  BUN 53* 49* 52*  CREATININE 7.34* 6.83* 6.78*  CALCIUM 11.7* 11.1* 11.0*  PHOS  --   --  6.3*   Liver Function Tests:  Recent Labs Lab 07/23/17 1406 07/25/17 0448  AST 18  --   ALT 18  --   ALKPHOS 84  --   BILITOT 0.9  --   PROT 7.7  --   ALBUMIN 3.7 3.4*    Recent Labs Lab 07/23/17 1406  LIPASE 29   No results for input(s): AMMONIA in the last 168 hours. Coagulation Profile: No results for input(s): INR, PROTIME in the last 168 hours. CBC:  Recent Labs Lab 07/23/17 1406 07/24/17 0507 07/25/17 0447  WBC 8.2 7.7 8.3  HGB 7.9* 7.0* 8.7*  HCT 23.0* 20.8* 25.3*  MCV 92.0 92.9 90.4  PLT 56* 48* 41*   Cardiac Enzymes:  Recent Labs Lab 07/23/17 1406  07/24/17 1003  CKTOTAL  --  28*  TROPONINI <0.03  --    BNP: Invalid input(s): POCBNP CBG:  Recent Labs Lab 07/24/17 1649 07/24/17 2058 07/25/17 0734 07/25/17 1125 07/25/17 1636  GLUCAP 134* 164* 124* 147* 115*   HbA1C:  Recent Labs  07/24/17 0507 07/25/17 0447  HGBA1C 7.1* 6.5*   Urine analysis:    Component Value Date/Time   COLORURINE STRAW (A) 07/23/2017 2251   APPEARANCEUR CLEAR 07/23/2017 2251   LABSPEC 1.014 07/23/2017 2251   PHURINE 6.0 07/23/2017 2251   GLUCOSEU >=500 (A) 07/23/2017 2251   HGBUR NEGATIVE 07/23/2017 2251   BILIRUBINUR NEGATIVE 07/23/2017 2251   KETONESUR NEGATIVE 07/23/2017 2251   PROTEINUR 100 (A) 07/23/2017 2251   NITRITE NEGATIVE 07/23/2017 2251   LEUKOCYTESUR NEGATIVE 07/23/2017 2251   Sepsis Labs: '@LABRCNTIP' (procalcitonin:4,lacticidven:4) )No results found for this or any previous visit (from the past 240 hour(s)).   Scheduled Meds: . aspirin EC  81 mg Oral q morning - 10a  . cyclobenzaprine  10 mg Oral QHS  . docusate sodium  100 mg  Oral BID  . furosemide  40 mg Intravenous BID  . insulin aspart  0-15 Units Subcutaneous TID WC  . pravastatin  20 mg Oral q1800   Continuous Infusions: . sodium chloride 135 mL/hr at 07/25/17 0919    Procedures/Studies: Ct Abdomen Pelvis Wo Contrast  Result Date: 07/23/2017 CLINICAL DATA:  Mid chest on lower back pain with movement for 1 week, tachycardia, pale nose, history of renal cell carcinoma post RIGHT nephrectomy, diabetes mellitus, prior cholecystectomy EXAM: CT ABDOMEN AND PELVIS WITHOUT CONTRAST TECHNIQUE: Multidetector CT imaging of the abdomen and pelvis was performed following the standard protocol without IV contrast. Sagittal and coronal MPR images reconstructed from axial data set. No oral contrast was administered. COMPARISON:  CT chest abdomen pelvis 01/25/2009 FINDINGS: Lower chest: Linear subsegmental atelectasis in BILATERAL lower lobes. Hepatobiliary: Gallbladder surgically  absent. No focal hepatic abnormality. Pancreas: Normal appearance Spleen: Normal appearance. Small splenule adjacent to medial border of spleen Adrenals/Urinary Tract: Adrenal glands normal. Prior RIGHT nephrectomy. Enlarged LEFT kidney consistent with compensatory hypertrophy. Large cyst at inferior pole 10.4 x 7.3 x 6.5 cm increased since previous study when it measured 6.0 x 4.4 x 5.5 cm. Ureters and bladder unremarkable. Stomach/Bowel: Normal appendix. Redundant sigmoid loop. Stomach and bowel loops otherwise unremarkable. Vascular/Lymphatic: Atherosclerotic calcifications aorta, iliac arteries, and coronary arteries. Aorta normal caliber. No adenopathy. Reproductive: Unremarkable prostate gland and seminal vesicles Other: No free air or free fluid. Tiny inguinal hernias containing fat. No ventral hernia seen. Musculoskeletal: Scattered degenerative disc disease changes lower lumbar spine greatest at L3-L4. A number of question a subtle lytic lesions are identified within thoracolumbar vertebra, subtle lytic metastases not excluded. Fracture identified at the LEFT transverse process of L3, new since 2010. IMPRESSION: Interval increase in size of a large cyst the inferior pole of the LEFT kidney 10.4 cm greatest size. Questionable subtle lytic lesions within thoracic and lumbar vertebra; consider follow-up MR assessment to exclude subtle lytic metastases. Interval fracture of the LEFT transverse process of L3 since 2010. Tiny BILATERAL inguinal hernias containing fat. Electronically Signed   By: Lavonia Dana M.D.   On: 07/23/2017 15:25   Dg Chest 2 View  Result Date: 07/23/2017 CLINICAL DATA:  Chest pain, tachycardia. EXAM: CHEST  2 VIEW COMPARISON:  Radiographs of February 24, 2004. FINDINGS: Stable cardiomediastinal silhouette. No pneumothorax or pleural effusion is noted. No acute pulmonary disease is noted. Multilevel degenerative disc disease is noted in the midthoracic spine. IMPRESSION: No active  cardiopulmonary disease. Electronically Signed   By: Marijo Conception, M.D.   On: 07/23/2017 14:57   US Renal  Result Date: 07/24/2017 CLINICAL DATA:  Acute renal failure. LEFT flank pain. Cholecystectomy. EXAM: RENAL / URINARY TRACT ULTRASOUND COMPLETE COMPARISON:  CT 07/23/2017, CT 12/29/2004 FINDINGS: Right Kidney: Length: Post nephrectomy 1998. Echogenicity within normal limits. No mass or hydronephrosis visualized. Left Kidney: Length: Compensatory hypertrophy of the LEFT kidney measuring 17 mm. No hydronephrosis or mass identified. Large anechoic cyst extends from the lower pole of the LEFT kidney measuring 9 cm in greatest dimension compared to 4.7 cm on CT 12/29/2004. Lesions has no worrisome characteristics by ultrasound or CT of 07/23/2017 Bladder: Normal with single jet IMPRESSION: 1. RIGHT nephrectomy. 2. Compensatory hypertrophy of the LEFT kidney. No mass lesion or obstruction. 3. Benign -appearing cyst exophytic from the lower pole of the LEFT kidney. 4. Normal bladder. Electronically Signed   By: Suzy Bouchard M.D.   On: 07/24/2017 11:12    Wynn Alldredge, DO  [facility] Pager [phone]  If 7PM-7AM, please contact night-coverage [email or web address] Password [facility] 07/25/2017, 6:17 PM   LOS: 2 days

## 2017-07-25 NOTE — Progress Notes (Addendum)
Pt to BR for possible BM with no luck. Pt advised we need stool sample. Pt verbalized understanding.

## 2017-07-25 NOTE — Progress Notes (Signed)
Subjective: Interval History: has no complaint of difficulty in breathing. He is still some back pain but mostly when he is moving. He lies down usually the pain improved. Patient also denies any nausea or vomiting..  Objective: Vital signs in last 24 hours: Temp:  [98.3 F (36.8 C)-99.3 F (37.4 C)] 98.5 F (36.9 C) (09/27 0548) Pulse Rate:  [80-88] 86 (09/27 0548) Resp:  [16-18] 18 (09/27 0548) BP: (130-141)/(61-66) 130/62 (09/27 0548) SpO2:  [97 %-99 %] 99 % (09/27 0548) Weight change:   Intake/Output from previous day: 09/26 0701 - 09/27 0700 In: 3233.1 [P.O.:800; I.V.:1789.6; Blood:643.5] Out: 2875 [Urine:2875] Intake/Output this shift: No intake/output data recorded.  General appearance: alert, cooperative and no distress Resp: clear to auscultation bilaterally Cardio: regular rate and rhythm, S1, S2 normal, no murmur, click, rub or gallop Extremities: no edema, redness or tenderness in the calves or thighs  Lab Results:  Recent Labs  07/24/17 0507 07/25/17 0447  WBC 7.7 8.3  HGB 7.0* 8.7*  HCT 20.8* 25.3*  PLT 48* 41*   BMET:  Recent Labs  07/24/17 0507 07/25/17 0448  NA 138 139  K 5.3* 5.1  CL 107 108  CO2 20* 17*  GLUCOSE 75 116*  BUN 49* 52*  CREATININE 6.83* 6.78*  CALCIUM 11.1* 11.0*   No results for input(s): PTH in the last 72 hours. Iron Studies: No results for input(s): IRON, TIBC, TRANSFERRIN, FERRITIN in the last 72 hours.  Studies/Results: Ct Abdomen Pelvis Wo Contrast  Result Date: 07/23/2017 CLINICAL DATA:  Mid chest on lower back pain with movement for 1 week, tachycardia, pale nose, history of renal cell carcinoma post RIGHT nephrectomy, diabetes mellitus, prior cholecystectomy EXAM: CT ABDOMEN AND PELVIS WITHOUT CONTRAST TECHNIQUE: Multidetector CT imaging of the abdomen and pelvis was performed following the standard protocol without IV contrast. Sagittal and coronal MPR images reconstructed from axial data set. No oral contrast was  administered. COMPARISON:  CT chest abdomen pelvis 01/25/2009 FINDINGS: Lower chest: Linear subsegmental atelectasis in BILATERAL lower lobes. Hepatobiliary: Gallbladder surgically absent. No focal hepatic abnormality. Pancreas: Normal appearance Spleen: Normal appearance. Small splenule adjacent to medial border of spleen Adrenals/Urinary Tract: Adrenal glands normal. Prior RIGHT nephrectomy. Enlarged LEFT kidney consistent with compensatory hypertrophy. Large cyst at inferior pole 10.4 x 7.3 x 6.5 cm increased since previous study when it measured 6.0 x 4.4 x 5.5 cm. Ureters and bladder unremarkable. Stomach/Bowel: Normal appendix. Redundant sigmoid loop. Stomach and bowel loops otherwise unremarkable. Vascular/Lymphatic: Atherosclerotic calcifications aorta, iliac arteries, and coronary arteries. Aorta normal caliber. No adenopathy. Reproductive: Unremarkable prostate gland and seminal vesicles Other: No free air or free fluid. Tiny inguinal hernias containing fat. No ventral hernia seen. Musculoskeletal: Scattered degenerative disc disease changes lower lumbar spine greatest at L3-L4. A number of question a subtle lytic lesions are identified within thoracolumbar vertebra, subtle lytic metastases not excluded. Fracture identified at the LEFT transverse process of L3, new since 2010. IMPRESSION: Interval increase in size of a large cyst the inferior pole of the LEFT kidney 10.4 cm greatest size. Questionable subtle lytic lesions within thoracic and lumbar vertebra; consider follow-up MR assessment to exclude subtle lytic metastases. Interval fracture of the LEFT transverse process of L3 since 2010. Tiny BILATERAL inguinal hernias containing fat. Electronically Signed   By: Lavonia Dana M.D.   On: 07/23/2017 15:25   Dg Chest 2 View  Result Date: 07/23/2017 CLINICAL DATA:  Chest pain, tachycardia. EXAM: CHEST  2 VIEW COMPARISON:  Radiographs of February 24, 2004.  FINDINGS: Stable cardiomediastinal silhouette. No  pneumothorax or pleural effusion is noted. No acute pulmonary disease is noted. Multilevel degenerative disc disease is noted in the midthoracic spine. IMPRESSION: No active cardiopulmonary disease. Electronically Signed   By: Marijo Conception, M.D.   On: 07/23/2017 14:57   US Renal  Result Date: 07/24/2017 CLINICAL DATA:  Acute renal failure. LEFT flank pain. Cholecystectomy. EXAM: RENAL / URINARY TRACT ULTRASOUND COMPLETE COMPARISON:  CT 07/23/2017, CT 12/29/2004 FINDINGS: Right Kidney: Length: Post nephrectomy 1998. Echogenicity within normal limits. No mass or hydronephrosis visualized. Left Kidney: Length: Compensatory hypertrophy of the LEFT kidney measuring 17 mm. No hydronephrosis or mass identified. Large anechoic cyst extends from the lower pole of the LEFT kidney measuring 9 cm in greatest dimension compared to 4.7 cm on CT 12/29/2004. Lesions has no worrisome characteristics by ultrasound or CT of 07/23/2017 Bladder: Normal with single jet IMPRESSION: 1. RIGHT nephrectomy. 2. Compensatory hypertrophy of the LEFT kidney. No mass lesion or obstruction. 3. Benign -appearing cyst exophytic from the lower pole of the LEFT kidney. 4. Normal bladder. Electronically Signed   By: Suzy Bouchard M.D.   On: 07/24/2017 11:12    I have reviewed the patient's current medications.  Assessment/Plan: Problem #1 acute kidney injury: At this moment seems to be multifactorial including prerenal syndrome/hypercalcemia/NSAIDS/ARBS. Patient presently nonoliguric and his creatinine is slightly coming down. Patient asymptomatic. Problem #2 hypercalcemia: At this moment each etiology is not clear. However history of renal CA/anemia/questionable lytic lesion on thoracolumbar area raised the question of metastatic CA and also possibility of multiple myeloma. At this moment workup is pending. His calcium is improving. Patient had 2800 mL of urine output.  Problem #3 history of hypertension: His blood pressure is  reasonably controlled Problem #4 history of right renal CA :status post nephrectomy Problem #5 history of left renal cyst. At this moment the cyst shows some increase in size from before. Hence need to rule out recurrence of his renal CA Problem #6 anemia: His hemoglobin is below. Patient also with thrombocytopenia Plan: 1] We'll increase IV fluid to 1 25 mL/h 2] will start on Lasix 40 mg IV twice a day 3] will check his renal panel and CBC in the morning.   LOS: 2 days   Dannie Hattabaugh S 07/25/2017,8:49 AM

## 2017-07-26 LAB — PTH, INTACT AND CALCIUM
Calcium, Total (PTH): 10.7 mg/dL — ABNORMAL HIGH (ref 8.6–10.2)
PTH: 13 pg/mL — ABNORMAL LOW (ref 15–65)

## 2017-07-26 LAB — RENAL FUNCTION PANEL
Albumin: 3.7 g/dL (ref 3.5–5.0)
Anion gap: 13 (ref 5–15)
BUN: 52 mg/dL — ABNORMAL HIGH (ref 6–20)
CALCIUM: 11.3 mg/dL — AB (ref 8.9–10.3)
CO2: 18 mmol/L — AB (ref 22–32)
CREATININE: 6.72 mg/dL — AB (ref 0.61–1.24)
Chloride: 109 mmol/L (ref 101–111)
GFR calc Af Amer: 9 mL/min — ABNORMAL LOW (ref >60)
GFR calc non Af Amer: 8 mL/min — ABNORMAL LOW (ref >60)
GLUCOSE: 120 mg/dL — AB (ref 65–99)
Phosphorus: 7.2 mg/dL — ABNORMAL HIGH (ref 2.5–4.6)
Potassium: 5.1 mmol/L (ref 3.5–5.1)
SODIUM: 140 mmol/L (ref 135–145)

## 2017-07-26 LAB — GLUCOSE, CAPILLARY
GLUCOSE-CAPILLARY: 112 mg/dL — AB (ref 65–99)
GLUCOSE-CAPILLARY: 155 mg/dL — AB (ref 65–99)
Glucose-Capillary: 123 mg/dL — ABNORMAL HIGH (ref 65–99)
Glucose-Capillary: 149 mg/dL — ABNORMAL HIGH (ref 65–99)

## 2017-07-26 LAB — VITAMIN D 25 HYDROXY (VIT D DEFICIENCY, FRACTURES): Vit D, 25-Hydroxy: 34 ng/mL (ref 30.0–100.0)

## 2017-07-26 LAB — OCCULT BLOOD X 1 CARD TO LAB, STOOL: FECAL OCCULT BLD: NEGATIVE

## 2017-07-26 LAB — CALCITRIOL (1,25 DI-OH VIT D): Vit D, 1,25-Dihydroxy: 9.5 pg/mL — ABNORMAL LOW (ref 19.9–79.3)

## 2017-07-26 MED ORDER — FUROSEMIDE 10 MG/ML IJ SOLN
100.0000 mg | Freq: Two times a day (BID) | INTRAVENOUS | Status: DC
  Filled 2017-07-26 (×6): qty 10

## 2017-07-26 MED ORDER — FUROSEMIDE 10 MG/ML IJ SOLN
100.0000 mg | Freq: Two times a day (BID) | INTRAMUSCULAR | Status: DC
  Administered 2017-07-26 – 2017-07-28 (×5): 100 mg via INTRAVENOUS
  Filled 2017-07-26 (×7): qty 10

## 2017-07-26 MED ORDER — PAMIDRONATE DISODIUM 30 MG/10ML IV SOLN
30.0000 mg | Freq: Once | INTRAVENOUS | Status: AC
  Administered 2017-07-26: 30 mg via INTRAVENOUS
  Filled 2017-07-26: qty 10

## 2017-07-26 NOTE — Progress Notes (Addendum)
Subjective: Interval History: Patient presently offers no complaints. He denies any difficulty breathing. His appetite is good  Objective: Vital signs in last 24 hours: Temp:  [98.5 F (36.9 C)-99 F (37.2 C)] 99 F (37.2 C) (09/28 0544) Pulse Rate:  [80-88] 88 (09/28 0544) Resp:  [16-18] 16 (09/28 0544) BP: (138-154)/(66-72) 154/72 (09/28 0544) SpO2:  [97 %-99 %] 97 % (09/28 0544) Weight change:   Intake/Output from previous day: 09/27 0701 - 09/28 0700 In: 3348.2 [P.O.:200; I.V.:3148.2] Out: 2100 [Urine:2100] Intake/Output this shift: Total I/O In: 240 [P.O.:240] Out: -   General appearance: alert, cooperative and no distress Resp: clear to auscultation bilaterally Cardio: regular rate and rhythm, S1, S2 normal, no murmur, click, rub or gallop Extremities: no edema, redness or tenderness in the calves or thighs  Lab Results:  Recent Labs  07/24/17 0507 07/25/17 0447  WBC 7.7 8.3  HGB 7.0* 8.7*  HCT 20.8* 25.3*  PLT 48* 41*   BMET:   Recent Labs  07/25/17 0448 07/26/17 0612  NA 139 140  K 5.1 5.1  CL 108 109  CO2 17* 18*  GLUCOSE 116* 120*  BUN 52* 52*  CREATININE 6.78* 6.72*  CALCIUM 11.0* 11.3*    Recent Labs  07/25/17 0447  PTH 13*  Comment   Iron Studies: No results for input(s): IRON, TIBC, TRANSFERRIN, FERRITIN in the last 72 hours.  Studies/Results: US Renal  Result Date: 07/24/2017 CLINICAL DATA:  Acute renal failure. LEFT flank pain. Cholecystectomy. EXAM: RENAL / URINARY TRACT ULTRASOUND COMPLETE COMPARISON:  CT 07/23/2017, CT 12/29/2004 FINDINGS: Right Kidney: Length: Post nephrectomy 1998. Echogenicity within normal limits. No mass or hydronephrosis visualized. Left Kidney: Length: Compensatory hypertrophy of the LEFT kidney measuring 17 mm. No hydronephrosis or mass identified. Large anechoic cyst extends from the lower pole of the LEFT kidney measuring 9 cm in greatest dimension compared to 4.7 cm on CT 12/29/2004. Lesions has no  worrisome characteristics by ultrasound or CT of 07/23/2017 Bladder: Normal with single jet IMPRESSION: 1. RIGHT nephrectomy. 2. Compensatory hypertrophy of the LEFT kidney. No mass lesion or obstruction. 3. Benign -appearing cyst exophytic from the lower pole of the LEFT kidney. 4. Normal bladder. Electronically Signed   By: Suzy Bouchard M.D.   On: 07/24/2017 11:12    I have reviewed the patient's current medications.  Assessment/Plan: Problem #1 acute kidney injury: At this moment seems to be multifactorial including prerenal syndrome/hypercalcemia/NSAIDS/ARBS/Invokana. His renal function is slightly improving. However there is no significant change. Patient presently is asymptomatic. Problem #2 hypercalcemia: At this moment each etiology is not clear. However history of renal CA/anemia/questionable lytic lesion on thoracolumbar area . Patient also was elevated Kappa chain. His Kappa to Lammda chain is also high. Hence need to rule out multiple myeloma. Patient has outpatient appointment to be seen by oncology. His   25 vitamin D is normal, intact PTH is low,. 1, 25 vitamin D and PTH related to hormone is pending. His calcium seems to be increasing. Problem #3 history of hypertension: His blood pressure is reasonably controlled Problem #4 history of right renal CA :status post nephrectomy Problem #5 history of left renal cyst. At this moment the cyst shows some increase in size from before. Hence need to rule out recurrence of his renal CA Problem #6 anemia: His hemoglobin is below. Patient also with thrombocytopenia Plan: 1] We'll increase IV fluid to 150 mL/m 2] will increase  Lasix to 100 mg IV twice a day 3] will check his renal  panel and CBC in the morning. 4] we'll give him pamidronate 30 mg IV 1 dose.   LOS: 3 days   Sienna Stonehocker S 07/26/2017,9:26 AM

## 2017-07-26 NOTE — Progress Notes (Signed)
         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 The patient denies any recent injuries, trauma, or falls. Because of worsening lower back pain and colleagues pointing out his pallor, the patient presented to Dr. Fagan's office for further evaluation on 07/23/17. The patient was noted to have tachycardia and pallor, and was sent to the emergency department for further evaluation. 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 He states that he has had intermittent chest pain only when he is lifting or pushing some objects. He denies any fevers, chills, breath, nausea, vomiting, diarrhea, dysuria, hematuria. He has noted some left sided flank pain. He states that he has had to sleep in a recliner to find a comfortable position secondary to his back pain. CT of the abdomen and pelvis in the emergency department revealed compensatory left renal hypertrophy without hydronephrosis. There werelytic lesion lesions in the thoracic lumbar spine with a fracture of the left transverse process of L3. The patient was also noted to be in acute kidney injury with serum creatinine of 7.34.   Assessment/Plan: Acute kidney injury with solitary L-kidney -Secondary to volume depletion in the setting of NSAID and losartan use, but concern about myeloma kidney -baseline creatinine 0.8-1.0 (july 2018) -renal ultrasound--no hydronephrosis of solitary left kidney, large left cyst -urine protein/creatinine ratio--12.57--mostly monoclonal protein -SPEP--elevated M-spike -serum immunofixation--shows elevated IgA monoclonal protein with kappa light chain specificity -urine IFE-->Bence Jones  Protein positive; kappa type. -consulted nephrology -continue IVF and lasix per nephrology -D/Closartan and voltaren  Hypercalcemia with globulin gap/Elevated monoclonal protein -Presented with corrected calcium of 11.9 -iPTH suggest nonparathyroid hypercalcemia -patient has globulin gap of 4 -concerned about myeloma -serum IFE shows elevated IgA monoclonal protein with kappa light chain specificity -urine IFE--Bence Jones Protein positive; kappa type. -continue IVF and lasix per nephrology -medical oncology consult appreciated -will need outpatient BM biopsy and whole body PET -LDH 182 -check PTHrp -9/28--pamidronate given by renal  Thrombocytopenia -Check serum B12--321 -Check TSH--2.344 -medical oncology consult appreciated -Will need bone marrow biopsy after d/c  L3 Transverse Process Fracture with lytic thoracolumbar lytic lesions -concerned about pathologic fracture -MRI could not be done due to pt's body habitus--can be deferred as pt will like have whole body PET after d/c and pt without any signs of radiculopathy -defer need for bone scan to medical oncology  Normocytic anemia -FOBT is negative -repeat FOBT -transfused 2 units PRBC -am CBC  Diabetes mellitus type 2 -Check hemoglobin A1c-6.5 -NovoLog sliding scale -Holding glipizide, metformin, and invokana  Hyperlipidemia -Continue Pravachol -Check CPK--28  Hyperkalemia -due to AKI -improved with IVF    Disposition Plan: Home when cleared by nephrology Family Communication: Sister updated at bedside 9/27--Total time spent 40 minutes.  Greater than 50% spent face to face counseling and coordinating care.   Consultants: Renal; medical oncology, urology  Code Status: DNR  DVT Prophylaxis: SCDs   Procedures: As Listed in Progress Note Above  Antibiotics: None   Subjective: Patient denies fevers, chills, headache, chest pain, dyspnea, nausea, vomiting, diarrhea,  abdominal pain, dysuria, hematuria, hematochezia, and melena.  Back pain is overall improved. He is able to ambulate without difficulty or radiculopathy type symptoms.     Objective: Vitals:   07/25/17 1516 07/25/17 2007 07/26/17 0544 07/26/17 1433  BP: 138/70 (!) 146/66 (!) 154/72 (!) 146/83  Pulse: 85 80 88 82  Resp: 18 18 16 16  Temp: 98.9 F (37.2 C) 98.5 F (36.9 C) 99 F (37.2 C) 98.7 F (37.1 C)  TempSrc: Oral Oral Oral Oral  SpO2: 97% 99% 97% 99%  Weight:      Height:        Intake/Output Summary (Last 24 hours) at 07/26/17 1751 Last data filed at 07/26/17 1500  Gross per 24 hour  Intake          4799.75 ml  Output             1800 ml  Net          2999.75 ml   Weight change:  Exam:   General:  Pt is alert, follows commands appropriately, not in acute distress  HEENT: No icterus, No thrush, No neck mass, [place]/AT  Cardiovascular: RRR, S1/S2, no rubs, no gallops  Respiratory: CTA bilaterally, no wheezing, no crackles, no rhonchi  Abdomen: Soft/+BS, non tender, non distended, no guarding  Extremities: trace LE edema, No lymphangitis, No petechiae, No rashes, no synovitis   Data Reviewed: I have personally reviewed following labs and imaging studies Basic Metabolic Panel:  Recent Labs Lab 07/23/17 1406 07/24/17 0507 07/25/17 0447 07/25/17 0448 07/26/17 0612  NA 136 138  --  139 140  K 4.5 5.3*  --  5.1 5.1  CL 104 107  --  108 109  CO2 20* 20*  --  17* 18*  GLUCOSE 126* 75  --  116* 120*  BUN 53* 49*  --  52* 52*  CREATININE 7.34* 6.83*  --  6.78* 6.72*  CALCIUM 11.7* 11.1* 10.7* 11.0* 11.3*  PHOS  --   --   --  6.3* 7.2*   Liver Function Tests:  Recent Labs Lab 07/23/17 1406 07/25/17 0448 07/26/17 0612  AST 18  --   --   ALT 18  --   --   ALKPHOS 84  --   --   BILITOT 0.9  --   --   PROT 7.7  --   --   ALBUMIN 3.7 3.4* 3.7    Recent Labs Lab 07/23/17 1406  LIPASE 29   No results for input(s): AMMONIA in the last 168  hours. Coagulation Profile: No results for input(s): INR, PROTIME in the last 168 hours. CBC:  Recent Labs Lab 07/23/17 1406 07/24/17 0507 07/25/17 0447  WBC 8.2 7.7 8.3  HGB 7.9* 7.0* 8.7*  HCT 23.0* 20.8* 25.3*  MCV 92.0 92.9 90.4  PLT 56* 48* 41*   Cardiac Enzymes:  Recent Labs Lab 07/23/17 1406 07/24/17 1003  CKTOTAL  --  28*  TROPONINI <0.03  --    BNP: Invalid input(s): POCBNP CBG:  Recent Labs Lab 07/25/17 1636 07/25/17 2006 07/26/17 0757 07/26/17 1113 07/26/17 1640  GLUCAP 115* 178* 112* 155* 123*   HbA1C:  Recent Labs  07/24/17 0507 07/25/17 0447  HGBA1C 7.1* 6.5*   Urine analysis:    Component Value Date/Time   COLORURINE STRAW (A) 07/23/2017 2251   APPEARANCEUR CLEAR 07/23/2017 2251   LABSPEC 1.014 07/23/2017 2251   PHURINE 6.0 07/23/2017 2251   GLUCOSEU >=500 (A) 07/23/2017 2251   HGBUR NEGATIVE 07/23/2017 2251   BILIRUBINUR NEGATIVE 07/23/2017 2251   KETONESUR NEGATIVE 07/23/2017 2251   PROTEINUR 100 (A) 07/23/2017 2251   NITRITE NEGATIVE 07/23/2017 2251     LEUKOCYTESUR NEGATIVE 07/23/2017 2251   Sepsis Labs: @LABRCNTIP(procalcitonin:4,lacticidven:4) )No results found for this or any previous visit (from the past 240 hour(s)).   Scheduled Meds: . aspirin EC  81 mg Oral q morning - 10a  . cyclobenzaprine  10 mg Oral QHS  . docusate sodium  100 mg Oral BID  . insulin aspart  0-15 Units Subcutaneous TID WC  . pravastatin  20 mg Oral q1800   Continuous Infusions: . sodium chloride 150 mL/hr at 07/26/17 0945  . furosemide Stopped (07/26/17 1254)    Procedures/Studies: Ct Abdomen Pelvis Wo Contrast  Result Date: 07/23/2017 CLINICAL DATA:  Mid chest on lower back pain with movement for 1 week, tachycardia, pale nose, history of renal cell carcinoma post RIGHT nephrectomy, diabetes mellitus, prior cholecystectomy EXAM: CT ABDOMEN AND PELVIS WITHOUT CONTRAST TECHNIQUE: Multidetector CT imaging of the abdomen and pelvis was performed  following the standard protocol without IV contrast. Sagittal and coronal MPR images reconstructed from axial data set. No oral contrast was administered. COMPARISON:  CT chest abdomen pelvis 01/25/2009 FINDINGS: Lower chest: Linear subsegmental atelectasis in BILATERAL lower lobes. Hepatobiliary: Gallbladder surgically absent. No focal hepatic abnormality. Pancreas: Normal appearance Spleen: Normal appearance. Small splenule adjacent to medial border of spleen Adrenals/Urinary Tract: Adrenal glands normal. Prior RIGHT nephrectomy. Enlarged LEFT kidney consistent with compensatory hypertrophy. Large cyst at inferior pole 10.4 x 7.3 x 6.5 cm increased since previous study when it measured 6.0 x 4.4 x 5.5 cm. Ureters and bladder unremarkable. Stomach/Bowel: Normal appendix. Redundant sigmoid loop. Stomach and bowel loops otherwise unremarkable. Vascular/Lymphatic: Atherosclerotic calcifications aorta, iliac arteries, and coronary arteries. Aorta normal caliber. No adenopathy. Reproductive: Unremarkable prostate gland and seminal vesicles Other: No free air or free fluid. Tiny inguinal hernias containing fat. No ventral hernia seen. Musculoskeletal: Scattered degenerative disc disease changes lower lumbar spine greatest at L3-L4. A number of question a subtle lytic lesions are identified within thoracolumbar vertebra, subtle lytic metastases not excluded. Fracture identified at the LEFT transverse process of L3, new since 2010. IMPRESSION: Interval increase in size of a large cyst the inferior pole of the LEFT kidney 10.4 cm greatest size. Questionable subtle lytic lesions within thoracic and lumbar vertebra; consider follow-up MR assessment to exclude subtle lytic metastases. Interval fracture of the LEFT transverse process of L3 since 2010. Tiny BILATERAL inguinal hernias containing fat. Electronically Signed   By: Mark  Boles M.D.   On: 07/23/2017 15:25   Dg Chest 2 View  Result Date: 07/23/2017 CLINICAL DATA:   Chest pain, tachycardia. EXAM: CHEST  2 VIEW COMPARISON:  Radiographs of February 24, 2004. FINDINGS: Stable cardiomediastinal silhouette. No pneumothorax or pleural effusion is noted. No acute pulmonary disease is noted. Multilevel degenerative disc disease is noted in the midthoracic spine. IMPRESSION: No active cardiopulmonary disease. Electronically Signed   By: James  Green Jr, M.D.   On: 07/23/2017 14:57   Us Renal  Result Date: 07/24/2017 CLINICAL DATA:  Acute renal failure. LEFT flank pain. Cholecystectomy. EXAM: RENAL / URINARY TRACT ULTRASOUND COMPLETE COMPARISON:  CT 07/23/2017, CT 12/29/2004 FINDINGS: Right Kidney: Length: Post nephrectomy 1998. Echogenicity within normal limits. No mass or hydronephrosis visualized. Left Kidney: Length: Compensatory hypertrophy of the LEFT kidney measuring 17 mm. No hydronephrosis or mass identified. Large anechoic cyst extends from the lower pole of the LEFT kidney measuring 9 cm in greatest dimension compared to 4.7 cm on CT 12/29/2004. Lesions has no worrisome characteristics by ultrasound or CT of 07/23/2017 Bladder: Normal with single jet IMPRESSION: 1. RIGHT   nephrectomy. 2. Compensatory hypertrophy of the LEFT kidney. No mass lesion or obstruction. 3. Benign -appearing cyst exophytic from the lower pole of the LEFT kidney. 4. Normal bladder. Electronically Signed   By: Suzy Bouchard M.D.   On: 07/24/2017 11:12    Samamtha Tiegs, DO  [facility] Pager [phone]  If 7PM-7AM, please contact night-coverage [email or web address] Password [email or web address] 07/26/2017, 5:51 PM   LOS: 3 days

## 2017-07-26 NOTE — Care Management Important Message (Signed)
Important Message  Patient Details  Name: Tom Marshall MRN: [id number] Date of Birth: 1951/06/28   Medicare Important Message Given:  Yes    Sherald Barge, RN 07/26/2017, 9:15 AM

## 2017-07-27 LAB — CBC
HEMATOCRIT: 25.2 % — AB (ref 39.0–52.0)
HEMOGLOBIN: 8.6 g/dL — AB (ref 13.0–17.0)
MCH: 30.6 pg (ref 26.0–34.0)
MCHC: 34.1 g/dL (ref 30.0–36.0)
MCV: 89.7 fL (ref 78.0–100.0)
Platelets: 36 10*3/uL — ABNORMAL LOW (ref 150–400)
RBC: 2.81 MIL/uL — AB (ref 4.22–5.81)
RDW: 16.1 % — ABNORMAL HIGH (ref 11.5–15.5)
WBC: 7.1 10*3/uL (ref 4.0–10.5)

## 2017-07-27 LAB — RENAL FUNCTION PANEL
ALBUMIN: 3.6 g/dL (ref 3.5–5.0)
ANION GAP: 13 (ref 5–15)
BUN: 61 mg/dL — ABNORMAL HIGH (ref 6–20)
CALCIUM: 11.1 mg/dL — AB (ref 8.9–10.3)
CO2: 17 mmol/L — ABNORMAL LOW (ref 22–32)
Chloride: 111 mmol/L (ref 101–111)
Creatinine, Ser: 6.59 mg/dL — ABNORMAL HIGH (ref 0.61–1.24)
GFR, EST AFRICAN AMERICAN: 9 mL/min — AB (ref >60)
GFR, EST NON AFRICAN AMERICAN: 8 mL/min — AB (ref >60)
Glucose, Bld: 112 mg/dL — ABNORMAL HIGH (ref 65–99)
PHOSPHORUS: 7.6 mg/dL — AB (ref 2.5–4.6)
POTASSIUM: 4.9 mmol/L (ref 3.5–5.1)
SODIUM: 141 mmol/L (ref 135–145)

## 2017-07-27 LAB — GLUCOSE, CAPILLARY
GLUCOSE-CAPILLARY: 104 mg/dL — AB (ref 65–99)
GLUCOSE-CAPILLARY: 99 mg/dL (ref 65–99)
Glucose-Capillary: 124 mg/dL — ABNORMAL HIGH (ref 65–99)
Glucose-Capillary: 152 mg/dL — ABNORMAL HIGH (ref 65–99)

## 2017-07-27 MED ORDER — SEVELAMER CARBONATE 800 MG PO TABS
1600.0000 mg | ORAL_TABLET | Freq: Three times a day (TID) | ORAL | Status: DC
  Administered 2017-07-27 – 2017-07-31 (×11): 1600 mg via ORAL
  Filled 2017-07-27 (×11): qty 2

## 2017-07-27 MED ORDER — SEVELAMER CARBONATE 800 MG PO TABS
800.0000 mg | ORAL_TABLET | ORAL | Status: DC
  Administered 2017-07-27 – 2017-07-30 (×5): 800 mg via ORAL
  Filled 2017-07-27 (×3): qty 1

## 2017-07-27 NOTE — Progress Notes (Signed)
Subjective: Interval History: Patient denies any difficulty breathing. Overall he is feeling good and no new complaints.  Objective: Vital signs in last 24 hours: Temp:  [98 F (36.7 C)-99.3 F (37.4 C)] 98 F (36.7 C) (09/29 0530) Pulse Rate:  [82-97] 86 (09/29 0530) Resp:  [16-20] 18 (09/29 0530) BP: (143-150)/(77-86) 150/77 (09/29 0530) SpO2:  [98 %-99 %] 99 % (09/29 0530) Weight change:   Intake/Output from previous day: 09/28 0701 - 09/29 0700 In: 3282 [P.O.:960; I.V.:1692; IV Piggyback:630] Out: 300 [Urine:300] Intake/Output this shift: No intake/output data recorded.  General appearance: alert, cooperative and no distress Resp: clear to auscultation bilaterally Cardio: regular rate and rhythm, S1, S2 normal, no murmur, click, rub or gallop Extremities: no edema, redness or tenderness in the calves or thighs  Lab Results:  Recent Labs  07/25/17 0447 07/27/17 0528  WBC 8.3 7.1  HGB 8.7* 8.6*  HCT 25.3* 25.2*  PLT 41* 36*   BMET:   Recent Labs  07/26/17 0612 07/27/17 0528  NA 140 141  K 5.1 4.9  CL 109 111  CO2 18* 17*  GLUCOSE 120* 112*  BUN 52* 61*  CREATININE 6.72* 6.59*  CALCIUM 11.3* 11.1*    Recent Labs  07/25/17 0447  PTH 13*  Comment   Iron Studies: No results for input(s): IRON, TIBC, TRANSFERRIN, FERRITIN in the last 72 hours.  Studies/Results: No results found.  I have reviewed the patient's current medications.  Assessment/Plan: Problem #1 acute kidney injury: At this moment seems to be multifactorial including prerenal syndrome/hypercalcemia/NSAIDS/ARBS/Invokana. His renal function Continued to improve. Patient remained asymptomatic. He is nonoliguric however he has outpatient says that she's going to the bathroom a lot his urine output is not documented 40. Problem #2 hypercalcemia: At this moment each etiology is not clear. However history of renal CA/anemia/questionable lytic lesion on thoracolumbar area . Patient also was  elevated Kappa chain. His Kappa to Lammda chain is also high. Hence need to rule out multiple myeloma. Patient has outpatient appointment to be seen by oncology. His   25 vitamin D is normal,  1, 25 vitamin D and intact PTH is low. However he is PTH related to hormone is pending. His calcium is improving but his phosphorus is high. Problem #3 history of hypertension: His blood pressure is reasonably controlled Problem #4 history of right renal CA :status post nephrectomy Problem #5 history of left renal cyst. At this moment the cyst shows some increase in size from before. Hence need to rule out recurrence of his renal CA Problem #6 anemia: His hemoglobin is below. Patient also with thrombocytopenia Plan: 1] We'll continue his present management 2] we'll do iron studies in the morning 3] will check his renal panel and CBC in the morning. 4] will start patient Renvela  800 mg 2 tablets by mouth 3 times a day with meals and 1 snack  LOS: 4 days   Marvelle Caudill S 07/27/2017,8:57 AM

## 2017-07-27 NOTE — Progress Notes (Signed)
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 The patient denies any recent injuries, trauma, or falls. Because of worsening lower back pain and colleagues pointing out his pallor, the patient presented to Dr. Ria Comment office for further evaluation on 07/23/17. The patient was noted to have tachycardia and pallor, and was sent to the emergency department for further evaluation. 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 He states that he has had intermittent chest pain only when he is lifting or pushing some objects. He denies any fevers, chills, breath, nausea, vomiting, diarrhea, dysuria, hematuria. He has noted some left sided flank pain. He states that he has had to sleep in a recliner to find a comfortable position secondary to his back pain. CT of the abdomen and pelvis in the emergency department revealed compensatory left renal hypertrophy without hydronephrosis. There werelytic lesion lesions in the thoracic lumbar spine with a fracture of the left transverse process of L3. The patient was also noted to be in acute kidney injury with serum creatinine of 7.34.   Assessment/Plan: Acute kidney injury with solitary L-kidney -Secondary to volume depletion in the setting of NSAID and losartan use, but concern about myeloma kidney -baseline creatinine 0.8-1.0 (july 2018) -renal ultrasound--no hydronephrosis of solitary left kidney, large left cyst -urine protein/creatinine ratio--12.57--mostly monoclonal protein -SPEP--elevated M-spike -serum immunofixation--shows elevated IgA monoclonal protein with kappa light chain specificity -urine IFE-->Bence  Jones Protein positive; kappa type. -consultednephrology -continue IVF and lasix per nephrology -D/Closartan and voltaren  Hypercalcemia with globulin gap/Elevated monoclonal protein -Presented with corrected calcium of 11.9 -iPTH suggest nonparathyroid hypercalcemia -patient has globulin gap of 4 -concerned about myeloma -serum IFE shows elevated IgA monoclonal protein with kappa light chain specificity -urine IFE--Bence Jones Protein positive; kappa type. -continue IVF and lasix per nephrology -medical oncology consult appreciated -will need outpatient BM biopsy and whole body PET -LDH 182 -check PTHrp--pending -9/28--pamidronate given by renal  Thrombocytopenia -Check serum B12--321 -Check TSH--2.344 -medical oncology consult appreciated -[facility] bone marrow biopsy after d/c  L3 Transverse Process Fracture with lytic thoracolumbar lytic lesions -concerned about pathologic fracture -MRI could not be done due to pt's body habitus--can be deferred as pt will like have whole body PET after d/c and pt without any signs of radiculopathy -defer need for bone scan to medical oncology  Normocytic anemia -FOBT is negative x 2 -transfused2 units PRBC for the admission -am CBC  Diabetes mellitus type 2 -Check hemoglobin A1c-6.5 -NovoLog sliding scale -Holding glipizide, metformin, and invokana -will not restart metformin or invokana after d/c due to AKI  Hyperlipidemia -Continue Pravachol -Check CPK--28  Hyperkalemia -due to AKI -improved with IVF    Disposition Plan: Home when cleared by nephrology Family Communication: Sister updatedat bedside 9/28  Consultants: Renal; medical oncology, urology  Code Status: DNR  DVT Prophylaxis: SCDs   Procedures: As Listed in Progress Note Above  Antibiotics: None    Subjective: Patient states that back. This gradually improving. He is able to ambulate without lower extremity weakness or  radicular type symptoms. He denies fevers, chills, headache, chest pain, shortness breath, nausea, vomiting, diarrhea, abdominal pain. No dysuria or hematuria.  Objective: Vitals:  07/26/17 1433 07/26/17 2100 07/27/17 0530 07/27/17 1400  BP: (!) 146/83 (!) 143/86 (!) 150/77 (!) 156/72  Pulse: 82 97 86 94  Resp: '16 20 18 18  ' Temp: 98.7 F (37.1 C) 99.3 F (37.4 C) 98 F (36.7 C) 99.7 F (37.6 C)  TempSrc: Oral Oral Oral Oral  SpO2: 99% 98% 99% 99%  Weight:      Height:        Intake/Output Summary (Last 24 hours) at 07/27/17 1636 Last data filed at 07/26/17 1828  Gross per 24 hour  Intake              300 ml  Output                0 ml  Net              300 ml   Weight change:  Exam:   General:  Pt is alert, follows commands appropriately, not in acute distress  HEENT: No icterus, No thrush, No neck mass, [place]/AT  Cardiovascular: RRR, S1/S2, no rubs, no gallops  Respiratory: CTA bilaterally, no wheezing, no crackles, no rhonchi  Abdomen: Soft/+BS, non tender, non distended, no guarding  Extremities: No edema, No lymphangitis, No petechiae, No rashes, no synovitis   Data Reviewed: I have personally reviewed following labs and imaging studies Basic Metabolic Panel:  Recent Labs Lab 07/23/17 1406 07/24/17 0507 07/25/17 0447 07/25/17 0448 07/26/17 0612 07/27/17 0528  NA 136 138  --  139 140 141  K 4.5 5.3*  --  5.1 5.1 4.9  CL 104 107  --  108 109 111  CO2 20* 20*  --  17* 18* 17*  GLUCOSE 126* 75  --  116* 120* 112*  BUN 53* 49*  --  52* 52* 61*  CREATININE 7.34* 6.83*  --  6.78* 6.72* 6.59*  CALCIUM 11.7* 11.1* 10.7* 11.0* 11.3* 11.1*  PHOS  --   --   --  6.3* 7.2* 7.6*   Liver Function Tests:  Recent Labs Lab 07/23/17 1406 07/25/17 0448 07/26/17 0612 07/27/17 0528  AST 18  --   --   --   ALT 18  --   --   --   ALKPHOS 84  --   --   --   BILITOT 0.9  --   --   --   PROT 7.7  --   --   --   ALBUMIN 3.7 3.4* 3.7 3.6    Recent Labs Lab  07/23/17 1406  LIPASE 29   No results for input(s): AMMONIA in the last 168 hours. Coagulation Profile: No results for input(s): INR, PROTIME in the last 168 hours. CBC:  Recent Labs Lab 07/23/17 1406 07/24/17 0507 07/25/17 0447 07/27/17 0528  WBC 8.2 7.7 8.3 7.1  HGB 7.9* 7.0* 8.7* 8.6*  HCT 23.0* 20.8* 25.3* 25.2*  MCV 92.0 92.9 90.4 89.7  PLT 56* 48* 41* 36*   Cardiac Enzymes:  Recent Labs Lab 07/23/17 1406 07/24/17 1003  CKTOTAL  --  28*  TROPONINI <0.03  --    BNP: Invalid input(s): POCBNP CBG:  Recent Labs Lab 07/26/17 1640 07/26/17 2019 07/27/17 0736 07/27/17 1133 07/27/17 1612  GLUCAP 123* 149* 104* 124* 99   HbA1C:  Recent Labs  07/25/17 0447  HGBA1C 6.5*   Urine analysis:    Component Value Date/Time   COLORURINE STRAW (A) 07/23/2017 2251   APPEARANCEUR CLEAR 07/23/2017 2251   LABSPEC 1.014 07/23/2017 2251   PHURINE 6.0  07/23/2017 2251   GLUCOSEU >=500 (A) 07/23/2017 2251   HGBUR NEGATIVE 07/23/2017 2251   BILIRUBINUR NEGATIVE 07/23/2017 2251   KETONESUR NEGATIVE 07/23/2017 2251   PROTEINUR 100 (A) 07/23/2017 2251   NITRITE NEGATIVE 07/23/2017 2251   LEUKOCYTESUR NEGATIVE 07/23/2017 2251   Sepsis Labs: '@LABRCNTIP' (procalcitonin:4,lacticidven:4) )No results found for this or any previous visit (from the past 240 hour(s)).   Scheduled Meds: . aspirin EC  81 mg Oral q morning - 10a  . cyclobenzaprine  10 mg Oral QHS  . docusate sodium  100 mg Oral BID  . insulin aspart  0-15 Units Subcutaneous TID WC  . pravastatin  20 mg Oral q1800  . sevelamer carbonate  1,600 mg Oral TID WC  . sevelamer carbonate  800 mg Oral With snacks   Continuous Infusions: . sodium chloride 150 mL/hr at 07/27/17 1516  . furosemide Stopped (07/27/17 0855)    Procedures/Studies: Ct Abdomen Pelvis Wo Contrast  Result Date: 07/23/2017 CLINICAL DATA:  Mid chest on lower back pain with movement for 1 week, tachycardia, pale nose, history of renal cell  carcinoma post RIGHT nephrectomy, diabetes mellitus, prior cholecystectomy EXAM: CT ABDOMEN AND PELVIS WITHOUT CONTRAST TECHNIQUE: Multidetector CT imaging of the abdomen and pelvis was performed following the standard protocol without IV contrast. Sagittal and coronal MPR images reconstructed from axial data set. No oral contrast was administered. COMPARISON:  CT chest abdomen pelvis 01/25/2009 FINDINGS: Lower chest: Linear subsegmental atelectasis in BILATERAL lower lobes. Hepatobiliary: Gallbladder surgically absent. No focal hepatic abnormality. Pancreas: Normal appearance Spleen: Normal appearance. Small splenule adjacent to medial border of spleen Adrenals/Urinary Tract: Adrenal glands normal. Prior RIGHT nephrectomy. Enlarged LEFT kidney consistent with compensatory hypertrophy. Large cyst at inferior pole 10.4 x 7.3 x 6.5 cm increased since previous study when it measured 6.0 x 4.4 x 5.5 cm. Ureters and bladder unremarkable. Stomach/Bowel: Normal appendix. Redundant sigmoid loop. Stomach and bowel loops otherwise unremarkable. Vascular/Lymphatic: Atherosclerotic calcifications aorta, iliac arteries, and coronary arteries. Aorta normal caliber. No adenopathy. Reproductive: Unremarkable prostate gland and seminal vesicles Other: No free air or free fluid. Tiny inguinal hernias containing fat. No ventral hernia seen. Musculoskeletal: Scattered degenerative disc disease changes lower lumbar spine greatest at L3-L4. A number of question a subtle lytic lesions are identified within thoracolumbar vertebra, subtle lytic metastases not excluded. Fracture identified at the LEFT transverse process of L3, new since 2010. IMPRESSION: Interval increase in size of a large cyst the inferior pole of the LEFT kidney 10.4 cm greatest size. Questionable subtle lytic lesions within thoracic and lumbar vertebra; consider follow-up MR assessment to exclude subtle lytic metastases. Interval fracture of the LEFT transverse process  of L3 since 2010. Tiny BILATERAL inguinal hernias containing fat. Electronically Signed   By: Lavonia Dana M.D.   On: 07/23/2017 15:25   Dg Chest 2 View  Result Date: 07/23/2017 CLINICAL DATA:  Chest pain, tachycardia. EXAM: CHEST  2 VIEW COMPARISON:  Radiographs of February 24, 2004. FINDINGS: Stable cardiomediastinal silhouette. No pneumothorax or pleural effusion is noted. No acute pulmonary disease is noted. Multilevel degenerative disc disease is noted in the midthoracic spine. IMPRESSION: No active cardiopulmonary disease. Electronically Signed   By: Marijo Conception, M.D.   On: 07/23/2017 14:57   US Renal  Result Date: 07/24/2017 CLINICAL DATA:  Acute renal failure. LEFT flank pain. Cholecystectomy. EXAM: RENAL / URINARY TRACT ULTRASOUND COMPLETE COMPARISON:  CT 07/23/2017, CT 12/29/2004 FINDINGS: Right Kidney: Length: Post nephrectomy 1998. Echogenicity within normal limits. No mass or hydronephrosis  visualized. Left Kidney: Length: Compensatory hypertrophy of the LEFT kidney measuring 17 mm. No hydronephrosis or mass identified. Large anechoic cyst extends from the lower pole of the LEFT kidney measuring 9 cm in greatest dimension compared to 4.7 cm on CT 12/29/2004. Lesions has no worrisome characteristics by ultrasound or CT of 07/23/2017 Bladder: Normal with single jet IMPRESSION: 1. RIGHT nephrectomy. 2. Compensatory hypertrophy of the LEFT kidney. No mass lesion or obstruction. 3. Benign -appearing cyst exophytic from the lower pole of the LEFT kidney. 4. Normal bladder. Electronically Signed   By: Suzy Bouchard M.D.   On: 07/24/2017 11:12    Dayelin Balducci, DO  [facility] Pager [phone]  If 7PM-7AM, please contact night-coverage [email or web address] Password [email or web address] 07/27/2017, 4:36 PM   LOS: 4 days

## 2017-07-28 DIAGNOSIS — D472 Monoclonal gammopathy: Secondary | ICD-10-CM

## 2017-07-28 LAB — GLUCOSE, CAPILLARY
GLUCOSE-CAPILLARY: 142 mg/dL — AB (ref 65–99)
Glucose-Capillary: 103 mg/dL — ABNORMAL HIGH (ref 65–99)
Glucose-Capillary: 134 mg/dL — ABNORMAL HIGH (ref 65–99)
Glucose-Capillary: 138 mg/dL — ABNORMAL HIGH (ref 65–99)

## 2017-07-28 LAB — RENAL FUNCTION PANEL
ALBUMIN: 3.7 g/dL (ref 3.5–5.0)
Anion gap: 13 (ref 5–15)
BUN: 60 mg/dL — AB (ref 6–20)
CHLORIDE: 106 mmol/L (ref 101–111)
CO2: 19 mmol/L — ABNORMAL LOW (ref 22–32)
CREATININE: 6.73 mg/dL — AB (ref 0.61–1.24)
Calcium: 9.9 mg/dL (ref 8.9–10.3)
GFR calc Af Amer: 9 mL/min — ABNORMAL LOW (ref >60)
GFR, EST NON AFRICAN AMERICAN: 8 mL/min — AB (ref >60)
GLUCOSE: 104 mg/dL — AB (ref 65–99)
PHOSPHORUS: 6.8 mg/dL — AB (ref 2.5–4.6)
POTASSIUM: 4.3 mmol/L (ref 3.5–5.1)
Sodium: 138 mmol/L (ref 135–145)

## 2017-07-28 LAB — PTH-RELATED PEPTIDE: PTH-related peptide: <2 pmol/L

## 2017-07-28 NOTE — Progress Notes (Signed)
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 The patient denies any recent injuries, trauma, or falls. Because of worsening lower back pain and colleagues pointing out his pallor, the patient presented to Dr. Ria Comment office for further evaluation on 07/23/17. The patient was noted to have tachycardia and pallor, and was sent to the emergency department for further evaluation. 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 He states that he has had intermittent chest pain only when he is lifting or pushing some objects. He denies any fevers, chills, breath, nausea, vomiting, diarrhea, dysuria, hematuria. He has noted some left sided flank pain. He states that he has had to sleep in a recliner to find a comfortable position secondary to his back pain. CT of the abdomen and pelvis in the emergency department revealed compensatory left renal hypertrophy without hydronephrosis. There werelytic lesion lesions in the thoracic lumbar spine with a fracture of the left transverse process of L3. The patient was also noted to be in acute kidney injury with serum creatinine of 7.34.   Assessment/Plan: Acute kidney injury with solitary L-kidney -Secondary to volume depletion in the setting of NSAID and losartan use, but concern about myeloma kidney -baseline creatinine 0.8-1.0 (july 2018) -renal ultrasound--no hydronephrosis of solitary left kidney, large left cyst -urine protein/creatinine ratio--12.57--mostly monoclonal protein -SPEP--elevated M-spike -serum immunofixation--shows elevated IgA monoclonal protein with kappa light chain specificity -urine IFE-->Bence Jones  Protein positive; kappa type. -consultednephrology -continue IVF and lasix per nephrology -D/Closartan and voltaren  Hypercalcemia with globulin gap/Monoclonal gammopathy -Presented with corrected calcium of 11.9 -iPTH suggest nonparathyroid hypercalcemia -patient has globulin gap of 4 -concerned about myeloma -serum IFE shows elevated IgA monoclonal protein with kappa light chain specificity -urine IFE--Bence Jones Protein positive; kappa type. -continue IVF and lasix per nephrology -medical oncology consult appreciated -will need outpatient BM biopsy and whole body PET -LDH 182 -check PTHrp--pending -9/28--pamidronate given by renal-->calcium improved  Thrombocytopenia -Check serum B12--321 -Check TSH--2.344 -medical oncology consult appreciated -[facility] bone marrow biopsy after d/c  L3 Transverse Process Fracture with lytic thoracolumbar lytic lesions -concerned about pathologic fracture -MRI could not be done due to pt's body habitus--can be deferred as pt will like have whole body PET after d/c and pt without any signs of radiculopathy -defer need for bone scan to medical oncology  Normocytic anemia -FOBT is negative x 2 -transfused2 units PRBC for the admission -am CBC  Diabetes mellitus type 2 -Check hemoglobin A1c-6.5 -NovoLog sliding scale -Holding glipizide, metformin, and invokana -will not restart metformin or invokana after d/c due to AKI  Hyperlipidemia -Continue Pravachol -Check CPK--28  Hyperkalemia -due to AKI -improved with IVF    Disposition Plan: Home when cleared by nephrology Family Communication: No family present  Consultants: Renal; medical oncology, urology  Code Status: DNR  DVT Prophylaxis: SCDs   Procedures: As Listed in Progress Note Above  Antibiotics: None   Subjective: Patient denies fevers, chills, headache, chest pain, dyspnea, nausea, vomiting, diarrhea, abdominal pain, dysuria,  hematuria, hematochezia, and melena. He denies any worsening back pain. He denies any leg weakness or radicular type symptoms down his legs.  Objective: Vitals:   07/27/17 2055 07/28/17 0602  07/28/17 0800 07/28/17 1500  BP: (!) 147/74 134/69  (!) 141/64  Pulse: 90 85  86  Resp: _0 Temp: 98.8 F (37.1 C) 98.3 F (36.8 C)  98.6 F (37 C)  TempSrc: Oral Oral  Oral  SpO2: 98% 98%  98%  Weight:   98 kg (216 lb 0.8 oz)   Height:   _1  (1.753 m)     Intake/Output Summary (Last 24 hours) at 07/28/17 1618 Last data filed at 07/28/17 1500  Gross per 24 hour  Intake              240 ml  Output             1675 ml  Net            -1435 ml   Weight change:  Exam:   General:  Pt is alert, follows commands appropriately, not in acute distress  HEENT: No icterus, No thrush, No neck mass, [place]/AT  Cardiovascular: RRR, S1/S2, no rubs, no gallops  Respiratory: CTA bilaterally, no wheezing, no crackles, no rhonchi  Abdomen: Soft/+BS, non tender, non distended, no guarding  Extremities: 1 + LE edema, No lymphangitis, No petechiae, No rashes, no synovitis   Data Reviewed: I have personally reviewed following labs and imaging studies Basic Metabolic Panel:  Recent Labs Lab 07/24/17 0507 07/25/17 0447 07/25/17 0448 07/26/17 0612 07/27/17 0528 07/28/17 0618  NA 138  --  139 140 141 138  K 5.3*  --  5.1 5.1 4.9 4.3  CL 107  --  108 109 111 106  CO2 20*  --  17* 18* 17* 19*  GLUCOSE 75  --  116* 120* 112* 104*  BUN 49*  --  52* 52* 61* 60*  CREATININE 6.83*  --  6.78* 6.72* 6.59* 6.73*  CALCIUM 11.1* 10.7* 11.0* 11.3* 11.1* 9.9  PHOS  --   --  6.3* 7.2* 7.6* 6.8*   Liver Function Tests:  Recent Labs Lab 07/23/17 1406 07/25/17 0448 07/26/17 0612 07/27/17 0528 07/28/17 0618  AST 18  --   --   --   --   ALT 18  --   --   --   --   ALKPHOS 84  --   --   --   --   BILITOT 0.9  --   --   --   --   PROT 7.7  --   --   --   --   ALBUMIN 3.7 3.4* 3.7 3.6 3.7     Recent Labs Lab 07/23/17 1406  LIPASE 29   No results for input(s): AMMONIA in the last 168 hours. Coagulation Profile: No results for input(s): INR, PROTIME in the last 168 hours. CBC:  Recent Labs Lab 07/23/17 1406 07/24/17 0507 07/25/17 0447 07/27/17 0528  WBC 8.2 7.7 8.3 7.1  HGB 7.9* 7.0* 8.7* 8.6*  HCT 23.0* 20.8* 25.3* 25.2*  MCV 92.0 92.9 90.4 89.7  PLT 56* 48* 41* 36*   Cardiac Enzymes:  Recent Labs Lab 07/23/17 1406 07/24/17 1003  CKTOTAL  --  28*  TROPONINI <0.03  --    BNP: Invalid input(s): POCBNP CBG:  Recent Labs Lab 07/27/17 1133 07/27/17 1612 07/27/17 2054 07/28/17 0806 07/28/17 1129  GLUCAP 124* 99 152* 103* 142*   HbA1C: No results for input(s): HGBA1C in the last 72 hours. Urine analysis:    Component Value Date/Time   COLORURINE STRAW (A) 07/23/2017 2251   APPEARANCEUR CLEAR 07/23/2017  2251   LABSPEC 1.014 07/23/2017 2251   PHURINE 6.0 07/23/2017 2251   GLUCOSEU >=500 (A) 07/23/2017 2251   HGBUR NEGATIVE 07/23/2017 2251   BILIRUBINUR NEGATIVE 07/23/2017 2251   KETONESUR NEGATIVE 07/23/2017 2251   PROTEINUR 100 (A) 07/23/2017 2251   NITRITE NEGATIVE 07/23/2017 2251   LEUKOCYTESUR NEGATIVE 07/23/2017 2251   Sepsis Labs: _0 (procalcitonin:4,lacticidven:4) )No results found for this or any previous visit (from the past 240 hour(s)).   Scheduled Meds: . aspirin EC  81 mg Oral q morning - 10a  . cyclobenzaprine  10 mg Oral QHS  . docusate sodium  100 mg Oral BID  . insulin aspart  0-15 Units Subcutaneous TID WC  . pravastatin  20 mg Oral q1800  . sevelamer carbonate  1,600 mg Oral TID WC  . sevelamer carbonate  800 mg Oral With snacks   Continuous Infusions: . sodium chloride 135 mL/hr at 07/28/17 1510    Procedures/Studies: Ct Abdomen Pelvis Wo Contrast  Result Date: 07/23/2017 CLINICAL DATA:  Mid chest on lower back pain with movement for 1 week, tachycardia, pale nose, history of renal cell carcinoma post  RIGHT nephrectomy, diabetes mellitus, prior cholecystectomy EXAM: CT ABDOMEN AND PELVIS WITHOUT CONTRAST TECHNIQUE: Multidetector CT imaging of the abdomen and pelvis was performed following the standard protocol without IV contrast. Sagittal and coronal MPR images reconstructed from axial data set. No oral contrast was administered. COMPARISON:  CT chest abdomen pelvis 01/25/2009 FINDINGS: Lower chest: Linear subsegmental atelectasis in BILATERAL lower lobes. Hepatobiliary: Gallbladder surgically absent. No focal hepatic abnormality. Pancreas: Normal appearance Spleen: Normal appearance. Small splenule adjacent to medial border of spleen Adrenals/Urinary Tract: Adrenal glands normal. Prior RIGHT nephrectomy. Enlarged LEFT kidney consistent with compensatory hypertrophy. Large cyst at inferior pole 10.4 x 7.3 x 6.5 cm increased since previous study when it measured 6.0 x 4.4 x 5.5 cm. Ureters and bladder unremarkable. Stomach/Bowel: Normal appendix. Redundant sigmoid loop. Stomach and bowel loops otherwise unremarkable. Vascular/Lymphatic: Atherosclerotic calcifications aorta, iliac arteries, and coronary arteries. Aorta normal caliber. No adenopathy. Reproductive: Unremarkable prostate gland and seminal vesicles Other: No free air or free fluid. Tiny inguinal hernias containing fat. No ventral hernia seen. Musculoskeletal: Scattered degenerative disc disease changes lower lumbar spine greatest at L3-L4. A number of question a subtle lytic lesions are identified within thoracolumbar vertebra, subtle lytic metastases not excluded. Fracture identified at the LEFT transverse process of L3, new since 2010. IMPRESSION: Interval increase in size of a large cyst the inferior pole of the LEFT kidney 10.4 cm greatest size. Questionable subtle lytic lesions within thoracic and lumbar vertebra; consider follow-up MR assessment to exclude subtle lytic metastases. Interval fracture of the LEFT transverse process of L3 since  2010. Tiny BILATERAL inguinal hernias containing fat. Electronically Signed   By: Lavonia Dana M.D.   On: 07/23/2017 15:25   Dg Chest 2 View  Result Date: 07/23/2017 CLINICAL DATA:  Chest pain, tachycardia. EXAM: CHEST  2 VIEW COMPARISON:  Radiographs of February 24, 2004. FINDINGS: Stable cardiomediastinal silhouette. No pneumothorax or pleural effusion is noted. No acute pulmonary disease is noted. Multilevel degenerative disc disease is noted in the midthoracic spine. IMPRESSION: No active cardiopulmonary disease. Electronically Signed   By: Marijo Conception, M.D.   On: 07/23/2017 14:57   US Renal  Result Date: 07/24/2017 CLINICAL DATA:  Acute renal failure. LEFT flank pain. Cholecystectomy. EXAM: RENAL / URINARY TRACT ULTRASOUND COMPLETE COMPARISON:  CT 07/23/2017, CT 12/29/2004 FINDINGS: Right Kidney: Length: Post nephrectomy 1998. Echogenicity within normal  limits. No mass or hydronephrosis visualized. Left Kidney: Length: Compensatory hypertrophy of the LEFT kidney measuring 17 mm. No hydronephrosis or mass identified. Large anechoic cyst extends from the lower pole of the LEFT kidney measuring 9 cm in greatest dimension compared to 4.7 cm on CT 12/29/2004. Lesions has no worrisome characteristics by ultrasound or CT of 07/23/2017 Bladder: Normal with single jet IMPRESSION: 1. RIGHT nephrectomy. 2. Compensatory hypertrophy of the LEFT kidney. No mass lesion or obstruction. 3. Benign -appearing cyst exophytic from the lower pole of the LEFT kidney. 4. Normal bladder. Electronically Signed   By: Suzy Bouchard M.D.   On: 07/24/2017 11:12    Shunte Senseney, DO  [facility] Pager [phone]  If 7PM-7AM, please contact night-coverage [email or web address] Password [email or web address] 07/28/2017, 4:18 PM   LOS: 5 days

## 2017-07-28 NOTE — Progress Notes (Signed)
Subjective: Interval History: Patient offers no complaint. Denies any nausea or vomiting  Objective: Vital signs in last 24 hours: Temp:  [98.3 F (36.8 C)-99.7 F (37.6 C)] 98.3 F (36.8 C) (09/30 0602) Pulse Rate:  [85-94] 85 (09/30 0602) Resp:  [16-18] 16 (09/30 0602) BP: (134-156)/(69-74) 134/69 (09/30 0602) SpO2:  [98 %-99 %] 98 % (09/30 0602) Weight change:   Intake/Output from previous day: 09/29 0701 - 09/30 0700 In: 240 [P.O.:240] Out: -  Intake/Output this shift: No intake/output data recorded.  General appearance: alert, cooperative and no distress Resp: clear to auscultation bilaterally Cardio: regular rate and rhythm, S1, S2 normal, no murmur, click, rub or gallop Extremities: no edema, redness or tenderness in the calves or thighs  Lab Results:  Recent Labs  07/27/17 0528  WBC 7.1  HGB 8.6*  HCT 25.2*  PLT 36*   BMET:   Recent Labs  07/27/17 0528 07/28/17 0618  NA 141 138  K 4.9 4.3  CL 111 106  CO2 17* 19*  GLUCOSE 112* 104*  BUN 61* 60*  CREATININE 6.59* 6.73*  CALCIUM 11.1* 9.9   No results for input(s): PTH in the last 72 hours. Iron Studies: No results for input(s): IRON, TIBC, TRANSFERRIN, FERRITIN in the last 72 hours.  Studies/Results: No results found.  I have reviewed the patient's current medications.  Assessment/Plan: Problem #1 acute kidney injury: At this moment seems to be multifactorial including prerenal syndrome/hypercalcemia/NSAIDS/ARBS/Invokana. His renal function Started declining today after showing some improvement yesterday. Unfortunately his urine output is not documented. Patient states that he is getting up multiple times and saving it. Presently there is some urine in the urinal. His weight is not also done hence at this moment no sure whether there is a component of prerenal syndrome. Patient is on Lasix. He didn't have any sign of fluid overload. Problem #2 hypercalcemia: At this moment each etiology is not  clear. However history of renal CA/anemia/questionable lytic lesion on thoracolumbar area . Patient also was elevated Kappa chain. His Kappa to Lammda chain is also high. Hence need to rule out multiple myeloma. Patient has outpatient appointment to be seen by oncology. His   25 vitamin D is normal,  1, 25 vitamin D and intact PTH is low. However he is PTH related to hormone is pending. His calcium has normalized and phosphorus is improving. Problem #3 history of hypertension: His blood pressure is reasonably controlled Problem #4 history of right renal CA :status post nephrectomy Problem #5 history of left renal cyst. At this moment the cyst shows some increase in size from before. Hence need to rule out recurrence of his renal CA Problem #6 anemia: His hemoglobin is low but stable. Plan: 1] We'll continue continue his hydration and decrease the rate to 1 35 mL per hour 2] discussed with nursing staff about documenting his input and output 3] will check his renal panel and CBC in the morning. 4] will DC Lasix 5] if the renal function continued to decline then we will initiate dialysis and follow him as an outpatient.  LOS: 5 days   Raphaella Larkin S 07/28/2017,8:36 AM

## 2017-07-29 LAB — GLUCOSE, CAPILLARY
GLUCOSE-CAPILLARY: 139 mg/dL — AB (ref 65–99)
GLUCOSE-CAPILLARY: 128 mg/dL — AB (ref 65–99)
Glucose-Capillary: 97 mg/dL (ref 65–99)
Glucose-Capillary: 140 mg/dL — ABNORMAL HIGH (ref 65–99)

## 2017-07-29 LAB — CBC WITH DIFFERENTIAL/PLATELET
BASOS ABS: 0.1 10*3/uL (ref 0.0–0.1)
Basophils Relative: 1 %
EOS PCT: 2 %
Eosinophils Absolute: 0.1 10*3/uL (ref 0.0–0.7)
HEMATOCRIT: 23.8 % — AB (ref 39.0–52.0)
HEMOGLOBIN: 7.9 g/dL — AB (ref 13.0–17.0)
LYMPHS PCT: 32 %
Lymphs Abs: 2.2 10*3/uL (ref 0.7–4.0)
MCH: 30.4 pg (ref 26.0–34.0)
MCHC: 33.2 g/dL (ref 30.0–36.0)
MCV: 91.5 fL (ref 78.0–100.0)
MONOS PCT: 12 %
Monocytes Absolute: 0.8 10*3/uL (ref 0.1–1.0)
NEUTROS PCT: 53 %
Neutro Abs: 3.6 10*3/uL (ref 1.7–7.7)
Platelets: 35 10*3/uL — ABNORMAL LOW (ref 150–400)
RBC: 2.6 MIL/uL — AB (ref 4.22–5.81)
RDW: 15.7 % — AB (ref 11.5–15.5)
WBC: 6.8 10*3/uL (ref 4.0–10.5)

## 2017-07-29 LAB — RENAL FUNCTION PANEL
ALBUMIN: 3.4 g/dL — AB (ref 3.5–5.0)
ANION GAP: 14 (ref 5–15)
BUN: 65 mg/dL — ABNORMAL HIGH (ref 6–20)
CHLORIDE: 108 mmol/L (ref 101–111)
CO2: 16 mmol/L — ABNORMAL LOW (ref 22–32)
Calcium: 8.9 mg/dL (ref 8.9–10.3)
Creatinine, Ser: 6.98 mg/dL — ABNORMAL HIGH (ref 0.61–1.24)
GFR calc Af Amer: 8 mL/min — ABNORMAL LOW (ref >60)
GFR, EST NON AFRICAN AMERICAN: 7 mL/min — AB (ref >60)
Glucose, Bld: 98 mg/dL (ref 65–99)
PHOSPHORUS: 6.3 mg/dL — AB (ref 2.5–4.6)
POTASSIUM: 4.3 mmol/L (ref 3.5–5.1)
Sodium: 138 mmol/L (ref 135–145)

## 2017-07-29 NOTE — Progress Notes (Signed)
Subjective: Interval History: His appetite is good and he doesn't have any nausea or vomiting. Patient also denies any difficulty breathing.  Objective: Vital signs in last 24 hours: Temp:  [98.2 F (36.8 C)-98.6 F (37 C)] 98.6 F (37 C) (10/01 0536) Pulse Rate:  [84-86] 84 (10/01 0536) Resp:  [17-18] 18 (10/01 0536) BP: (136-141)/(63-67) 139/63 (10/01 0536) SpO2:  [98 %-99 %] 98 % (10/01 0536) Weight:  [100.5 kg (221 lb 9 oz)] 100.5 kg (221 lb 9 oz) (10/01 0536) Weight change:   Intake/Output from previous day: 09/30 0701 - 10/01 0700 In: 3399.8 [P.O.:720; I.V.:2679.8] Out: 4375 [Urine:4375] Intake/Output this shift: Total I/O In: 686.3 [I.V.:686.3] Out: 375 [Urine:375]  General appearance: alert, cooperative and no distress Resp: clear to auscultation bilaterally Cardio: regular rate and rhythm, S1, S2 normal, no murmur, click, rub or gallop Extremities: no edema, redness or tenderness in the calves or thighs  Lab Results:  Recent Labs  07/27/17 0528 07/29/17 0745  WBC 7.1 6.8  HGB 8.6* 7.9*  HCT 25.2* 23.8*  PLT 36* 35*   BMET:   Recent Labs  07/28/17 0618 07/29/17 0745  NA 138 138  K 4.3 4.3  CL 106 108  CO2 19* 16*  GLUCOSE 104* 98  BUN 60* 65*  CREATININE 6.73* 6.98*  CALCIUM 9.9 8.9   No results for input(s): PTH in the last 72 hours. Iron Studies: No results for input(s): IRON, TIBC, TRANSFERRIN, FERRITIN in the last 72 hours.  Studies/Results: No results found.  I have reviewed the patient's current medications.  Assessment/Plan: Problem #1 acute kidney injury: At this moment seems to be multifactorial including prerenal syndrome/hypercalcemia/NSAIDS/ARBS/Invokana. His renal function Started declining today after showing some improvement yesterday. Unfortunately his urine output is not documented. Patient states that he is getting up multiple times and saving it. His creatinine continued to increase. Presently he is nonoliguric. He has 4300  mL of urine output and patient is not on diuretics. Problem #2 hypercalcemia: His calcium is normal. His phosphorus is high but improving. Presently he is on a binder. Problem #3 history of hypertension: His blood pressure is reasonably controlled Problem #4 history of right renal CA :status post nephrectomy Problem #5 history of left renal cyst. At this moment the cyst shows some increase in size from before. Hence need to rule out recurrence of his renal CA Problem #6 anemia: His hemoglobin is low and declining. Plan: 1] We'll continue continue his hydration  2] since his renal function continued to decline will consider starting hemodialysis especially if his renal function declined further by tomorrow.. His renal function improves his dialysis can be discontinued. 3] will check his renal panel and CBC in the morning. 4] will put PPD  LOS: 6 days   Charina Fons S 07/29/2017,9:58 AM

## 2017-07-29 NOTE — Consult Note (Signed)
Reconsulted to see patient for bone marrow biopsy.  Please see our original note from 07/24/17 by Mike Craze, NP.  Agree that he will need bone marrow biopsy but this will need to be done as an outpatient once his acute issues have resolved. SPEP is polyclonal at this time. Will plan to set him up an outpatient follow up once he is stable for discharge for further workup including whole body PET and bone marrow biopsy.  Twana First, MD

## 2017-07-29 NOTE — Progress Notes (Signed)
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 The patient denies any recent injuries, trauma, or falls. Because of worsening lower back pain and colleagues pointing out his pallor, the patient presented to Dr. Ria Comment office for further evaluation on 07/23/17. The patient was noted to have tachycardia and pallor, and was sent to the emergency department for further evaluation. 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 He states that he has had intermittent chest pain only when he is lifting or pushing some objects. He denies any fevers, chills, breath, nausea, vomiting, diarrhea, dysuria, hematuria. He has noted some left sided flank pain. He states that he has had to sleep in a recliner to find a comfortable position secondary to his back pain. CT of the abdomen and pelvis in the emergency department revealed compensatory left renal hypertrophy without hydronephrosis. There werelytic lesion lesions in the thoracic lumbar spine with a fracture of the left transverse process of L3. The patient was also noted to be in acute kidney injury with serum creatinine of 7.34.   Assessment/Plan: Acute kidney injury with solitary L-kidney -Secondary to volume depletion in the setting of NSAID and losartan use, but concern about myeloma kidney -baseline creatinine 0.8-1.0 (july 2018) -renal ultrasound--no hydronephrosis of solitary left kidney, large left cyst -urine protein/creatinine ratio--12.57 -SPEP--elevated M-spike, biclonal -serum immunofixation--shows elevated IgA monoclonal protein with kappa light chain specificity -urine IFE-->Bence Jones Protein positive;  kappa type. -consultednephrology-->now planning HD catheter 07/30/17 if no renal improvement -10/1--case discussed with Dr. Hinda Lenis -continue IVF and lasix per nephrology -D/Closartan and voltaren  Hypercalcemia with globulin gap/Monoclonal gammopathy -Presented with corrected calcium of 11.9 -iPTH suggest nonparathyroid hypercalcemia -patient has globulin gap of 4 -concerned about myeloma -serum IFE shows elevated IgA monoclonal protein with kappa light chain specificity -urine IFE--Bence Jones Protein positive; kappa type. -continue IVF and lasix per nephrology -medical oncology consult appreciated-->follow up outpatient for BM biopsy and PET -will need outpatient BM biopsy and whole body PET -LDH 182 -check PTHrp--pending -9/28--pamidronate given by renal-->calcium improved   Thrombocytopenia -Check serum B12--321 -Check TSH--2.344 -medical oncology consult appreciated -Willneed bone marrow biopsy after d/c  L3 Transverse Process Fracture with lytic thoracolumbar lytic lesions -concerned about pathologic fracture -MRI could not be done due to pt's body habitus--can be deferred as pt will like have whole body PET after d/c and pt without any signs of radiculopathy -defer need for bone scan to medical oncology  Normocytic anemia -FOBT is negative x 2 -transfused2 units PRBC for the admission -am CBC  Diabetes mellitus type 2 -Check hemoglobin A1c-6.5 -NovoLog sliding scale -Holding glipizide, metformin, and invokana -will not restart metformin or invokana after d/c due to AKI -CBGs controlled  Hyperlipidemia -Continue Pravachol -Check CPK--28  Hyperkalemia -due to AKI -improved with IVF    Disposition Plan: Home when cleared by nephrology Family Communication: No family present  Consultants: Renal; medical oncology, urology  Code Status: DNR  DVT Prophylaxis: SCDs   Procedures: As Listed in Progress Note  Above  Antibiotics: None    Subjective: Patient denies fevers, chills, headache, chest pain, dyspnea, nausea, vomiting, diarrhea, abdominal pain, dysuria, hematuria, hematochezia, and melena.   Objective: Vitals:  07/28/17 0800 07/28/17 1500 07/28/17 2047 07/29/17 0536  BP:  (!) 141/64 136/67 139/63  Pulse:  86 86 84  Resp:  '17 18 18  ' Temp:  98.6 F (37 C) 98.2 F (36.8 C) 98.6 F (37 C)  TempSrc:  Oral Oral Oral  SpO2:  98% 99% 98%  Weight: 98 kg (216 lb 0.8 oz)   100.5 kg (221 lb 9 oz)  Height: '5\' 9"'  (1.753 m)       Intake/Output Summary (Last 24 hours) at 07/29/17 1228 Last data filed at 07/29/17 1157  Gross per 24 hour  Intake             4156 ml  Output             4050 ml  Net              106 ml   Weight change:  Exam:   General:  Pt is alert, follows commands appropriately, not in acute distress  HEENT: No icterus, No thrush, No neck mass, [place]/AT  Cardiovascular: RRR, S1/S2, no rubs, no gallops  Respiratory: CTA bilaterally, no wheezing, no crackles, no rhonchi  Abdomen: Soft/+BS, non tender, non distended, no guarding  Extremities: 1 + LE edema, No lymphangitis, No petechiae, No rashes, no synovitis   Data Reviewed: I have personally reviewed following labs and imaging studies Basic Metabolic Panel:  Recent Labs Lab 07/25/17 0448 07/26/17 0612 07/27/17 0528 07/28/17 0618 07/29/17 0745  NA 139 140 141 138 138  K 5.1 5.1 4.9 4.3 4.3  CL 108 109 111 106 108  CO2 17* 18* 17* 19* 16*  GLUCOSE 116* 120* 112* 104* 98  BUN 52* 52* 61* 60* 65*  CREATININE 6.78* 6.72* 6.59* 6.73* 6.98*  CALCIUM 11.0* 11.3* 11.1* 9.9 8.9  PHOS 6.3* 7.2* 7.6* 6.8* 6.3*   Liver Function Tests:  Recent Labs Lab 07/23/17 1406 07/25/17 0448 07/26/17 0612 07/27/17 0528 07/28/17 0618 07/29/17 0745  AST 18  --   --   --   --   --   ALT 18  --   --   --   --   --   ALKPHOS 84  --   --   --   --   --   BILITOT 0.9  --   --   --   --   --   PROT 7.7  --   --    --   --   --   ALBUMIN 3.7 3.4* 3.7 3.6 3.7 3.4*    Recent Labs Lab 07/23/17 1406  LIPASE 29   No results for input(s): AMMONIA in the last 168 hours. Coagulation Profile: No results for input(s): INR, PROTIME in the last 168 hours. CBC:  Recent Labs Lab 07/23/17 1406 07/24/17 0507 07/25/17 0447 07/27/17 0528 07/29/17 0745  WBC 8.2 7.7 8.3 7.1 6.8  NEUTROABS  --   --   --   --  3.6  HGB 7.9* 7.0* 8.7* 8.6* 7.9*  HCT 23.0* 20.8* 25.3* 25.2* 23.8*  MCV 92.0 92.9 90.4 89.7 91.5  PLT 56* 48* 41* 36* 35*   Cardiac Enzymes:  Recent Labs Lab 07/23/17 1406 07/24/17 1003  CKTOTAL  --  28*  TROPONINI <0.03  --    BNP: Invalid input(s): POCBNP CBG:  Recent Labs Lab 07/28/17 1129 07/28/17 1620 07/28/17 2022 07/29/17 0737 07/29/17 1140  GLUCAP 142* 134* 138* 97 139*   HbA1C: No results for input(s): HGBA1C in the last 72 hours.  Urine analysis:    Component Value Date/Time   COLORURINE STRAW (A) 07/23/2017 2251   APPEARANCEUR CLEAR 07/23/2017 2251   LABSPEC 1.014 07/23/2017 2251   PHURINE 6.0 07/23/2017 2251   GLUCOSEU >=500 (A) 07/23/2017 2251   HGBUR NEGATIVE 07/23/2017 2251   BILIRUBINUR NEGATIVE 07/23/2017 2251   KETONESUR NEGATIVE 07/23/2017 2251   PROTEINUR 100 (A) 07/23/2017 2251   NITRITE NEGATIVE 07/23/2017 2251   LEUKOCYTESUR NEGATIVE 07/23/2017 2251   Sepsis Labs: '@LABRCNTIP' (procalcitonin:4,lacticidven:4) )No results found for this or any previous visit (from the past 240 hour(s)).   Scheduled Meds: . aspirin EC  81 mg Oral q morning - 10a  . cyclobenzaprine  10 mg Oral QHS  . docusate sodium  100 mg Oral BID  . insulin aspart  0-15 Units Subcutaneous TID WC  . pravastatin  20 mg Oral q1800  . sevelamer carbonate  1,600 mg Oral TID WC  . sevelamer carbonate  800 mg Oral With snacks   Continuous Infusions: . sodium chloride 150 mL/hr at 07/29/17 1005    Procedures/Studies: Ct Abdomen Pelvis Wo Contrast  Result Date:  07/23/2017 CLINICAL DATA:  Mid chest on lower back pain with movement for 1 week, tachycardia, pale nose, history of renal cell carcinoma post RIGHT nephrectomy, diabetes mellitus, prior cholecystectomy EXAM: CT ABDOMEN AND PELVIS WITHOUT CONTRAST TECHNIQUE: Multidetector CT imaging of the abdomen and pelvis was performed following the standard protocol without IV contrast. Sagittal and coronal MPR images reconstructed from axial data set. No oral contrast was administered. COMPARISON:  CT chest abdomen pelvis 01/25/2009 FINDINGS: Lower chest: Linear subsegmental atelectasis in BILATERAL lower lobes. Hepatobiliary: Gallbladder surgically absent. No focal hepatic abnormality. Pancreas: Normal appearance Spleen: Normal appearance. Small splenule adjacent to medial border of spleen Adrenals/Urinary Tract: Adrenal glands normal. Prior RIGHT nephrectomy. Enlarged LEFT kidney consistent with compensatory hypertrophy. Large cyst at inferior pole 10.4 x 7.3 x 6.5 cm increased since previous study when it measured 6.0 x 4.4 x 5.5 cm. Ureters and bladder unremarkable. Stomach/Bowel: Normal appendix. Redundant sigmoid loop. Stomach and bowel loops otherwise unremarkable. Vascular/Lymphatic: Atherosclerotic calcifications aorta, iliac arteries, and coronary arteries. Aorta normal caliber. No adenopathy. Reproductive: Unremarkable prostate gland and seminal vesicles Other: No free air or free fluid. Tiny inguinal hernias containing fat. No ventral hernia seen. Musculoskeletal: Scattered degenerative disc disease changes lower lumbar spine greatest at L3-L4. A number of question a subtle lytic lesions are identified within thoracolumbar vertebra, subtle lytic metastases not excluded. Fracture identified at the LEFT transverse process of L3, new since 2010. IMPRESSION: Interval increase in size of a large cyst the inferior pole of the LEFT kidney 10.4 cm greatest size. Questionable subtle lytic lesions within thoracic and lumbar  vertebra; consider follow-up MR assessment to exclude subtle lytic metastases. Interval fracture of the LEFT transverse process of L3 since 2010. Tiny BILATERAL inguinal hernias containing fat. Electronically Signed   By: Lavonia Dana M.D.   On: 07/23/2017 15:25   Dg Chest 2 View  Result Date: 07/23/2017 CLINICAL DATA:  Chest pain, tachycardia. EXAM: CHEST  2 VIEW COMPARISON:  Radiographs of February 24, 2004. FINDINGS: Stable cardiomediastinal silhouette. No pneumothorax or pleural effusion is noted. No acute pulmonary disease is noted. Multilevel degenerative disc disease is noted in the midthoracic spine. IMPRESSION: No active cardiopulmonary disease. Electronically Signed   By: Marijo Conception, M.D.   On: 07/23/2017 14:57   US Renal  Result Date: 07/24/2017 CLINICAL DATA:  Acute renal failure. LEFT flank pain. Cholecystectomy. EXAM: RENAL /  URINARY TRACT ULTRASOUND COMPLETE COMPARISON:  CT 07/23/2017, CT 12/29/2004 FINDINGS: Right Kidney: Length: Post nephrectomy 1998. Echogenicity within normal limits. No mass or hydronephrosis visualized. Left Kidney: Length: Compensatory hypertrophy of the LEFT kidney measuring 17 mm. No hydronephrosis or mass identified. Large anechoic cyst extends from the lower pole of the LEFT kidney measuring 9 cm in greatest dimension compared to 4.7 cm on CT 12/29/2004. Lesions has no worrisome characteristics by ultrasound or CT of 07/23/2017 Bladder: Normal with single jet IMPRESSION: 1. RIGHT nephrectomy. 2. Compensatory hypertrophy of the LEFT kidney. No mass lesion or obstruction. 3. Benign -appearing cyst exophytic from the lower pole of the LEFT kidney. 4. Normal bladder. Electronically Signed   By: Suzy Bouchard M.D.   On: 07/24/2017 11:12    Weyman Bogdon, DO  [facility] Pager [phone]  If 7PM-7AM, please contact night-coverage [email or web address] Password [email or web address] 07/29/2017, 12:28 PM   LOS: 6 days

## 2017-07-30 LAB — GLUCOSE, CAPILLARY
Glucose-Capillary: 100 mg/dL — ABNORMAL HIGH (ref 65–99)
Glucose-Capillary: 153 mg/dL — ABNORMAL HIGH (ref 65–99)
Glucose-Capillary: 95 mg/dL (ref 65–99)
Glucose-Capillary: 149 mg/dL — ABNORMAL HIGH (ref 65–99)

## 2017-07-30 LAB — RENAL FUNCTION PANEL
ANION GAP: 11 (ref 5–15)
Albumin: 3.4 g/dL — ABNORMAL LOW (ref 3.5–5.0)
BUN: 65 mg/dL — ABNORMAL HIGH (ref 6–20)
CALCIUM: 8.2 mg/dL — AB (ref 8.9–10.3)
CHLORIDE: 112 mmol/L — AB (ref 101–111)
CO2: 16 mmol/L — AB (ref 22–32)
Creatinine, Ser: 6.59 mg/dL — ABNORMAL HIGH (ref 0.61–1.24)
GFR calc non Af Amer: 8 mL/min — ABNORMAL LOW (ref >60)
GFR, EST AFRICAN AMERICAN: 9 mL/min — AB (ref >60)
Glucose, Bld: 113 mg/dL — ABNORMAL HIGH (ref 65–99)
Phosphorus: 5.7 mg/dL — ABNORMAL HIGH (ref 2.5–4.6)
Potassium: 4.4 mmol/L (ref 3.5–5.1)
SODIUM: 139 mmol/L (ref 135–145)

## 2017-07-30 LAB — PTH-RELATED PEPTIDE

## 2017-07-30 LAB — CBC
HCT: 18.1 % — ABNORMAL LOW (ref 39.0–52.0)
HEMOGLOBIN: 6.2 g/dL — AB (ref 13.0–17.0)
MCH: 31 pg (ref 26.0–34.0)
MCHC: 34.3 g/dL (ref 30.0–36.0)
MCV: 90.5 fL (ref 78.0–100.0)
Platelets: 58 10*3/uL — ABNORMAL LOW (ref 150–400)
RBC: 2 MIL/uL — AB (ref 4.22–5.81)
RDW: 16.4 % — ABNORMAL HIGH (ref 11.5–15.5)
WBC: 6.9 10*3/uL (ref 4.0–10.5)

## 2017-07-30 LAB — HEMOGLOBIN AND HEMATOCRIT, BLOOD
HCT: 24.1 % — ABNORMAL LOW (ref 39.0–52.0)
HEMOGLOBIN: 8.1 g/dL — AB (ref 13.0–17.0)

## 2017-07-30 LAB — PREPARE RBC (CROSSMATCH)

## 2017-07-30 MED ORDER — SODIUM CHLORIDE 0.9 % IV SOLN
Freq: Once | INTRAVENOUS | Status: AC
  Administered 2017-07-30: 07:00:00 via INTRAVENOUS

## 2017-07-30 MED ORDER — SODIUM BICARBONATE 650 MG PO TABS
650.0000 mg | ORAL_TABLET | Freq: Two times a day (BID) | ORAL | Status: DC
  Administered 2017-07-30 – 2017-07-31 (×3): 650 mg via ORAL
  Filled 2017-07-30 (×3): qty 1

## 2017-07-30 NOTE — Progress Notes (Addendum)
Subjective: Interval History: Patient offers no complaint. He denies any difficulty in breathing  Objective: Vital signs in last 24 hours: Temp:  [98.2 F (36.8 C)] 98.2 F (36.8 C) (10/02 0500) Pulse Rate:  [81-92] 81 (10/02 0500) Resp:  [18-19] 18 (10/02 0500) BP: (131-135)/(60-64) 131/60 (10/02 0500) SpO2:  [98 %-100 %] 100 % (10/02 0500) Weight change:   Intake/Output from previous day: 10/01 0701 - 10/02 0700 In: 1893.8 [P.O.:480; I.V.:1413.8] Out: 2850 [Urine:2850] Intake/Output this shift: No intake/output data recorded.  General appearance: alert, cooperative and no distress Resp: clear to auscultation bilaterally Cardio: regular rate and rhythm, S1, S2 normal, no murmur, click, rub or gallop Extremities: no edema, redness or tenderness in the calves or thighs  Lab Results:  Recent Labs  07/29/17 0745 07/30/17 0419  WBC 6.8 6.9  HGB 7.9* 6.2*  HCT 23.8* 18.1*  PLT 35* 58*   BMET:   Recent Labs  07/29/17 0745 07/30/17 0419  NA 138 139  K 4.3 4.4  CL 108 112*  CO2 16* 16*  GLUCOSE 98 113*  BUN 65* 65*  CREATININE 6.98* 6.59*  CALCIUM 8.9 8.2*   No results for input(s): PTH in the last 72 hours. Iron Studies: No results for input(s): IRON, TIBC, TRANSFERRIN, FERRITIN in the last 72 hours.  Studies/Results: No results found.  I have reviewed the patient's current medications.  Assessment/Plan: Problem #1 acute kidney injury: At this moment seems to be multifactorial including prerenal syndrome/hypercalcemia/NSAIDS/ARBS/Invokana.  His renal function is improving and patient remains asymptomatic. He has 2800 cc of urine  Out put and off diuretics Problem #2 hypercalcemia: His calcium is normal. His phosphorus is high but has come down and today 5.7. Remains on binder Problem #3 history of hypertension: His blood pressure is reasonably controlled Problem #4 history of right renal CA :status post nephrectomy Problem #5 history of left renal cyst. At  this moment the cyst shows some increase in size from before. Hence need to rule out recurrence of his renal CA Problem #6 anemia: His hemoglobin is low and declining. Problem# 7 Low CO2 possibly metabolic Plan: 1] We'll continue continue his hydration ad blood transfusion. 2] We will check his Renal panel, iron studies  in am 3] We will start patient on sodium bicarbonate 650 mg po bid 4] We will hold dialysis for now.. I have discussed with patient and if his creatinine declines further in am he could be discharged and I will check his blood work on Friday as out patient and follow from there. Patient is agreeable to the idea. If he requires dialysis we will make a descion as out aptient   LOS: 7 days   Tom Marshall S 07/30/2017,8:24 AM

## 2017-07-30 NOTE — Progress Notes (Signed)
Pt's hemoglobin 6.2, Dr. Olevia Bowens aware, 1 unit PRBC's ordered, will make day RN aware.

## 2017-07-30 NOTE — Care Management Note (Signed)
Case Management Note  Patient Details  Name: Tom Marshall MRN: [id number] Date of Birth: 02/16/1951  If discussed at Long Length of Stay Meetings, dates discussed:  07/30/2017   Sherald Barge, RN 07/30/2017, 12:06 PM

## 2017-07-30 NOTE — Progress Notes (Addendum)
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 The patient denies any recent injuries, trauma, or falls. Because of worsening lower back pain and colleagues pointing out his pallor, the patient presented to Dr. Ria Comment office for further evaluation on 07/23/17. The patient was noted to have tachycardia and pallor, and was sent to the emergency department for further evaluation. 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 He states that he has had intermittent chest pain only when he is lifting or pushing some objects. He denies any fevers, chills, breath, nausea, vomiting, diarrhea, dysuria, hematuria. He has noted some left sided flank pain. He states that he has had to sleep in a recliner to find a comfortable position secondary to his back pain. CT of the abdomen and pelvis in the emergency department revealed compensatory left renal hypertrophy without hydronephrosis. There werelytic lesion lesions in the thoracic lumbar spine with a fracture of the left transverse process of L3. The patient was also noted to be in acute kidney injury with serum creatinine of 7.34. 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  The patient was seen by hematology who recommended outpatient follow-up for bone marrow biopsy. Nephrology was consulted to assist with management of his acute renal failure  Assessment/Plan: Acute kidney injury with solitary L-kidney -Secondary to volume depletion in the setting of NSAID and losartan use, but concern about myeloma kidney -baseline creatinine 0.8-1.0 (july 2018) -renal ultrasound--no hydronephrosis of solitary  left kidney, large left cyst -urine protein/creatinine ratio--12.57 -SPEP--elevated M-spike, biclonal -serum immunofixation--shows elevated IgA monoclonal protein with kappa light chain specificity -urine IFE-->Bence Jones Protein positive; kappa type. -consultednephrology-->now planning HD catheter 07/30/17 if no renal improvement -10/2--case discussed with Dr. Ulysees Barns d/c 10/3 if renal function stable -continue IVF and lasix per nephrology -D/Closartan and voltaren  Hypercalcemia with globulin gap/Monoclonal gammopathy -Presented with corrected calcium of 11.9 -iPTH suggest nonparathyroid hypercalcemia -patient has globulin gap of 4 -concerned about myeloma -serum IFE shows elevated IgA monoclonal protein with kappa light chain specificity -urine IFE--Bence Jones Protein positive; kappa type. -continue IVF and lasix per nephrology -medical oncology consult appreciated-->follow up outpatient for BM biopsy and PET -will need outpatient BM biopsy and whole body PET -LDH 182 -check PTHrp--pending -9/28--pamidronate given by renal-->calcium improved   Thrombocytopenia -Check serum B12--321 -Check TSH--2.344 -medical oncology consult appreciated -Willneed bone marrow biopsy after d/c -overall stable  L3 Transverse Process Fracture with lytic thoracolumbar lytic lesions -concerned about pathologic fracture -MRI could not be done due to pt's body habitus--can be deferred as pt will like have whole body PET after d/c and pt without any signs of radiculopathy -defer need for bone scan to medical oncology  Normocytic anemia -FOBT is negative x 2 -transfused3 units PRBC for the admission -am CBC  Diabetes mellitus type 2 -Check hemoglobin A1c-6.5 -NovoLog sliding scale -Holding glipizide, metformin, and invokana -will not restart metformin or invokana after d/c due to AKI -CBGs controlled  Hyperlipidemia -Continue Pravachol -Check  CPK--28  Hyperkalemia -due to AKI -improved with IVF    Disposition Plan: Home when cleared by nephrology Family Communication: No family present  Consultants: Renal; medical oncology, urology  Code Status:  DNR  DVT Prophylaxis: SCDs   Procedures: As Listed in Progress Note Above  Antibiotics: None     Subjective: Patient denies fevers, chills, headache, chest pain, dyspnea, nausea, vomiting, diarrhea, abdominal pain, dysuria, hematuria, hematochezia, and melena.   Objective: Vitals:   07/29/17 2028 07/29/17 2055 07/30/17 0500 07/30/17 1245  BP:  135/60 131/60 (!) 137/57  Pulse:  86 81 84  Resp:  '19 18 20  ' Temp:  98.2 F (36.8 C) 98.2 F (36.8 C) 98.4 F (36.9 C)  TempSrc:  Oral Oral Oral  SpO2: 98% 98% 100% 100%  Weight:      Height:        Intake/Output Summary (Last 24 hours) at 07/30/17 1619 Last data filed at 07/30/17 0600  Gross per 24 hour  Intake              240 ml  Output             1800 ml  Net            -1560 ml   Weight change:  Exam:   General:  Pt is alert, follows commands appropriately, not in acute distress  HEENT: No icterus, No thrush, No neck mass, [place]/AT  Cardiovascular: RRR, S1/S2, no rubs, no gallops  Respiratory: CTA bilaterally, no wheezing, no crackles, no rhonchi  Abdomen: Soft/+BS, non tender, non distended, no guarding  Extremities: trace LE edema, No lymphangitis, No petechiae, No rashes, no synovitis   Data Reviewed: I have personally reviewed following labs and imaging studies Basic Metabolic Panel:  Recent Labs Lab 07/26/17 0612 07/27/17 0528 07/28/17 0618 07/29/17 0745 07/30/17 0419  NA 140 141 138 138 139  K 5.1 4.9 4.3 4.3 4.4  CL 109 111 106 108 112*  CO2 18* 17* 19* 16* 16*  GLUCOSE 120* 112* 104* 98 113*  BUN 52* 61* 60* 65* 65*  CREATININE 6.72* 6.59* 6.73* 6.98* 6.59*  CALCIUM 11.3* 11.1* 9.9 8.9 8.2*  PHOS 7.2* 7.6* 6.8* 6.3* 5.7*   Liver Function Tests:  Recent  Labs Lab 07/26/17 0612 07/27/17 0528 07/28/17 0618 07/29/17 0745 07/30/17 0419  ALBUMIN 3.7 3.6 3.7 3.4* 3.4*   No results for input(s): LIPASE, AMYLASE in the last 168 hours. No results for input(s): AMMONIA in the last 168 hours. Coagulation Profile: No results for input(s): INR, PROTIME in the last 168 hours. CBC:  Recent Labs Lab 07/24/17 0507 07/25/17 0447 07/27/17 0528 07/29/17 0745 07/30/17 0419  WBC 7.7 8.3 7.1 6.8 6.9  NEUTROABS  --   --   --  3.6  --   HGB 7.0* 8.7* 8.6* 7.9* 6.2*  HCT 20.8* 25.3* 25.2* 23.8* 18.1*  MCV 92.9 90.4 89.7 91.5 90.5  PLT 48* 41* 36* 35* 58*   Cardiac Enzymes:  Recent Labs Lab 07/24/17 1003  CKTOTAL 28*   BNP: Invalid input(s): POCBNP CBG:  Recent Labs Lab 07/29/17 1140 07/29/17 1626 07/29/17 2329 07/30/17 0750 07/30/17 1156  GLUCAP 139* 128* 140* 100* 153*   HbA1C: No results for input(s): HGBA1C in the last 72 hours. Urine analysis:    Component Value Date/Time   COLORURINE STRAW (A) 07/23/2017 2251   APPEARANCEUR CLEAR 07/23/2017 2251   LABSPEC 1.014 07/23/2017 2251   PHURINE 6.0 07/23/2017 2251   GLUCOSEU >=500 (A) 07/23/2017 2251   HGBUR NEGATIVE 07/23/2017 2251   BILIRUBINUR NEGATIVE 07/23/2017 2251   KETONESUR NEGATIVE 07/23/2017 2251   PROTEINUR 100 (A) 07/23/2017 2251   NITRITE NEGATIVE 07/23/2017 2251  LEUKOCYTESUR NEGATIVE 07/23/2017 2251   Sepsis Labs: '@LABRCNTIP' (procalcitonin:4,lacticidven:4) )No results found for this or any previous visit (from the past 240 hour(s)).   Scheduled Meds: . aspirin EC  81 mg Oral q morning - 10a  . cyclobenzaprine  10 mg Oral QHS  . docusate sodium  100 mg Oral BID  . insulin aspart  0-15 Units Subcutaneous TID WC  . pravastatin  20 mg Oral q1800  . sevelamer carbonate  1,600 mg Oral TID WC  . sevelamer carbonate  800 mg Oral With snacks  . sodium bicarbonate  650 mg Oral BID   Continuous Infusions: . sodium chloride 135 mL/hr at 07/30/17 1005     Procedures/Studies: Ct Abdomen Pelvis Wo Contrast  Result Date: 07/23/2017 CLINICAL DATA:  Mid chest on lower back pain with movement for 1 week, tachycardia, pale nose, history of renal cell carcinoma post RIGHT nephrectomy, diabetes mellitus, prior cholecystectomy EXAM: CT ABDOMEN AND PELVIS WITHOUT CONTRAST TECHNIQUE: Multidetector CT imaging of the abdomen and pelvis was performed following the standard protocol without IV contrast. Sagittal and coronal MPR images reconstructed from axial data set. No oral contrast was administered. COMPARISON:  CT chest abdomen pelvis 01/25/2009 FINDINGS: Lower chest: Linear subsegmental atelectasis in BILATERAL lower lobes. Hepatobiliary: Gallbladder surgically absent. No focal hepatic abnormality. Pancreas: Normal appearance Spleen: Normal appearance. Small splenule adjacent to medial border of spleen Adrenals/Urinary Tract: Adrenal glands normal. Prior RIGHT nephrectomy. Enlarged LEFT kidney consistent with compensatory hypertrophy. Large cyst at inferior pole 10.4 x 7.3 x 6.5 cm increased since previous study when it measured 6.0 x 4.4 x 5.5 cm. Ureters and bladder unremarkable. Stomach/Bowel: Normal appendix. Redundant sigmoid loop. Stomach and bowel loops otherwise unremarkable. Vascular/Lymphatic: Atherosclerotic calcifications aorta, iliac arteries, and coronary arteries. Aorta normal caliber. No adenopathy. Reproductive: Unremarkable prostate gland and seminal vesicles Other: No free air or free fluid. Tiny inguinal hernias containing fat. No ventral hernia seen. Musculoskeletal: Scattered degenerative disc disease changes lower lumbar spine greatest at L3-L4. A number of question a subtle lytic lesions are identified within thoracolumbar vertebra, subtle lytic metastases not excluded. Fracture identified at the LEFT transverse process of L3, new since 2010. IMPRESSION: Interval increase in size of a large cyst the inferior pole of the LEFT kidney 10.4 cm  greatest size. Questionable subtle lytic lesions within thoracic and lumbar vertebra; consider follow-up MR assessment to exclude subtle lytic metastases. Interval fracture of the LEFT transverse process of L3 since 2010. Tiny BILATERAL inguinal hernias containing fat. Electronically Signed   By: Lavonia Dana M.D.   On: 07/23/2017 15:25   Dg Chest 2 View  Result Date: 07/23/2017 CLINICAL DATA:  Chest pain, tachycardia. EXAM: CHEST  2 VIEW COMPARISON:  Radiographs of February 24, 2004. FINDINGS: Stable cardiomediastinal silhouette. No pneumothorax or pleural effusion is noted. No acute pulmonary disease is noted. Multilevel degenerative disc disease is noted in the midthoracic spine. IMPRESSION: No active cardiopulmonary disease. Electronically Signed   By: Marijo Conception, M.D.   On: 07/23/2017 14:57   US Renal  Result Date: 07/24/2017 CLINICAL DATA:  Acute renal failure. LEFT flank pain. Cholecystectomy. EXAM: RENAL / URINARY TRACT ULTRASOUND COMPLETE COMPARISON:  CT 07/23/2017, CT 12/29/2004 FINDINGS: Right Kidney: Length: Post nephrectomy 1998. Echogenicity within normal limits. No mass or hydronephrosis visualized. Left Kidney: Length: Compensatory hypertrophy of the LEFT kidney measuring 17 mm. No hydronephrosis or mass identified. Large anechoic cyst extends from the lower pole of the LEFT kidney measuring 9 cm in greatest dimension compared to 4.7  cm on CT 12/29/2004. Lesions has no worrisome characteristics by ultrasound or CT of 07/23/2017 Bladder: Normal with single jet IMPRESSION: 1. RIGHT nephrectomy. 2. Compensatory hypertrophy of the LEFT kidney. No mass lesion or obstruction. 3. Benign -appearing cyst exophytic from the lower pole of the LEFT kidney. 4. Normal bladder. Electronically Signed   By: Suzy Bouchard M.D.   On: 07/24/2017 11:12    Elayna Tobler, DO  [facility] Pager [phone]  If 7PM-7AM, please contact night-coverage [email or web address] Password [email or web address] 07/30/2017, 4:19 PM    LOS: 7 days

## 2017-07-31 DIAGNOSIS — E1169 Type 2 diabetes mellitus with other specified complication: Secondary | ICD-10-CM

## 2017-07-31 DIAGNOSIS — D696 Thrombocytopenia, unspecified: Secondary | ICD-10-CM

## 2017-07-31 DIAGNOSIS — D649 Anemia, unspecified: Secondary | ICD-10-CM

## 2017-07-31 DIAGNOSIS — N179 Acute kidney failure, unspecified: Principal | ICD-10-CM

## 2017-07-31 DIAGNOSIS — E669 Obesity, unspecified: Secondary | ICD-10-CM

## 2017-07-31 DIAGNOSIS — D472 Monoclonal gammopathy: Secondary | ICD-10-CM

## 2017-07-31 LAB — RENAL FUNCTION PANEL
ALBUMIN: 3.5 g/dL (ref 3.5–5.0)
ANION GAP: 9 (ref 5–15)
BUN: 59 mg/dL — ABNORMAL HIGH (ref 6–20)
CALCIUM: 8.3 mg/dL — AB (ref 8.9–10.3)
CO2: 16 mmol/L — ABNORMAL LOW (ref 22–32)
Chloride: 116 mmol/L — ABNORMAL HIGH (ref 101–111)
Creatinine, Ser: 5.98 mg/dL — ABNORMAL HIGH (ref 0.61–1.24)
GFR calc non Af Amer: 9 mL/min — ABNORMAL LOW (ref >60)
GFR, EST AFRICAN AMERICAN: 10 mL/min — AB (ref >60)
GLUCOSE: 96 mg/dL (ref 65–99)
PHOSPHORUS: 4.9 mg/dL — AB (ref 2.5–4.6)
Potassium: 4.6 mmol/L (ref 3.5–5.1)
SODIUM: 141 mmol/L (ref 135–145)

## 2017-07-31 LAB — CBC
HEMATOCRIT: 23.5 % — AB (ref 39.0–52.0)
Hemoglobin: 7.9 g/dL — ABNORMAL LOW (ref 13.0–17.0)
MCH: 30 pg (ref 26.0–34.0)
MCHC: 33.6 g/dL (ref 30.0–36.0)
MCV: 89.4 fL (ref 78.0–100.0)
PLATELETS: 28 10*3/uL — AB (ref 150–400)
RBC: 2.63 MIL/uL — ABNORMAL LOW (ref 4.22–5.81)
RDW: 16.2 % — AB (ref 11.5–15.5)
WBC: 7.3 10*3/uL (ref 4.0–10.5)

## 2017-07-31 LAB — IRON AND TIBC
Iron: 102 ug/dL (ref 45–182)
SATURATION RATIOS: 41 % — AB (ref 17.9–39.5)
TIBC: 251 ug/dL (ref 250–450)
UIBC: 149 ug/dL

## 2017-07-31 LAB — BPAM RBC
Blood Product Expiration Date: 201810052359
ISSUE DATE / TIME: 201810021248
Unit Type and Rh: 5100

## 2017-07-31 LAB — TYPE AND SCREEN
ABO/RH(D): A POS
ANTIBODY SCREEN: NEGATIVE
Unit division: 0

## 2017-07-31 LAB — GLUCOSE, CAPILLARY
GLUCOSE-CAPILLARY: 141 mg/dL — AB (ref 65–99)
Glucose-Capillary: 84 mg/dL (ref 65–99)

## 2017-07-31 LAB — FERRITIN: Ferritin: 911 ng/mL — ABNORMAL HIGH (ref 24–336)

## 2017-07-31 MED ORDER — SODIUM BICARBONATE 650 MG PO TABS: 650.0000 mg | ORAL_TABLET | Freq: Two times a day (BID) | ORAL | 1 refills | Status: DC

## 2017-07-31 MED ORDER — GLIPIZIDE ER 2.5 MG PO TB24: 2.5000 mg | ORAL_TABLET | Freq: Every day | ORAL | 0 refills | Status: DC

## 2017-07-31 NOTE — Progress Notes (Signed)
Pt is being d/c'd home via w/c and private automobile.  Educated in changes of medications and provided patient with detailed instructions on his d/c paperwork.  He voiced understanding and Answered all questions correctly during "Medication Teach Back".  Pt encouraged to make f/u appt with his PCP within a week.  Stated he would do so once he returned home.  Pt denies pain on admission.  All questions and concerns were addressed and educational material given.  Pt was given [facility] [address] Nurses station phone number and was encouraged to call for any questions that may arise after discharge.  Also, he was encouraged to call PCP/Nurse for any questions or concerns if he wished to do so.  Agreed to the above.

## 2017-07-31 NOTE — Discharge Summary (Signed)
Physician Discharge Summary  Tom Marshall PXT:[id number] DOB: 05/29/1951 DOA: 07/23/2017  PCP: Asencion Noble, MD  Admit date: 07/23/2017 Discharge date: 07/31/2017  Admitted From: Home Disposition:  Home  Recommendations for Outpatient Follow-up:  1. Follow up with PCP in 1-2 weeks 2. Follow-up at [facility] on 10/4 3. Follow-up with nephrology on 10/5. 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 The patient denies any recent injuries, trauma, or falls. Because of worsening lower back pain and colleagues pointing out his pallor, the patient presented to Dr. Ria Comment office for further evaluation on 07/23/17. The patient was noted to have tachycardia and pallor, and was sent to the emergency department for further evaluation. 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 He states that he has had intermittent chest pain only when he is lifting or pushing some objects. He denies any fevers, chills, breath, nausea, vomiting, diarrhea, dysuria, hematuria. He has noted some left sided flank pain. He states that he has had to sleep in a recliner to find a comfortable position secondary to his back pain. CT of the abdomen and pelvis in the emergency department revealed compensatory left renal hypertrophy without hydronephrosis. There werelytic lesion lesions in the thoracic lumbar spine with a fracture of the left transverse process of L3. The patient was also noted to be in acute kidney injury with serum creatinine of 7.34. 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  The patient was seen by hematology who recommended outpatient follow-up for bone marrow biopsy. Nephrology was consulted to assist with management of his acute renal failure  Discharge Diagnoses:  Principal Problem:   Renal failure Active Problems:   Hypercalcemia   Anemia   Thrombocytopenia (HCC)   Diabetes mellitus type 2 in obese (HCC)   AKI (acute kidney injury) ([facility])   Closed fracture of transverse process of lumbar vertebra, with delayed healing, subsequent encounter   Monoclonal gammopathy  Acute kidney injury with solitary L-kidney -Secondary to volume depletion in the setting of NSAID and losartan use, but concern about myeloma kidney -baseline creatinine 0.8-1.0 (july 2018) -renal ultrasound--no hydronephrosis of solitary left kidney, large left cyst -urine protein/creatinine ratio--12.57 -SPEP--elevated M-spike, biclonal -serum immunofixation--shows elevated IgA monoclonal protein with kappa light chain specificity -urine IFE-->Bence Jones Protein positive; kappa type. -consultednephrology - patient's creatinine continued to slowly improve. It was felt that he was stable for discharge and follow-up with nephrology as an outpatient with repeat labs on 10/5. No indication for dialysis at this time.[name] -D/Closartan and voltaren  Hypercalcemia with globulin gap/Monoclonal gammopathy -Presented with corrected calcium of 11.9 -iPTH suggest nonparathyroid hypercalcemia -patient has globulin gap of 4 -concerned about myeloma -serum IFE shows elevated IgA monoclonal protein with kappa light chain specificity -urine IFE--Bence Jones Protein positive; kappa type. -continue IVF and lasix per nephrology -medical oncology consult appreciated-->follow up outpatient for BM biopsy and PET Scan. -will need outpatient BM biopsy and whole body PET. Appointment has been arranged for 10/4 -LDH 182 -9/28--pamidronate given by renal-->calcium  improved  Thrombocytopenia -Check serum B12--321 -Check TSH--2.344 -medical oncology consult appreciated -Willneed bone marrow biopsy after d/c -overall stable, no signs of bleeding  L3 Transverse Process Fracture with lytic thoracolumbar lytic lesions -concerned about pathologic fracture -MRI could not be done due to pt's body habitus--can be deferred as pt will like have whole body PET after d/c and pt without any signs of radiculopathy -defer need for bone scan to medical oncology  Normocytic anemia -FOBT is negative x 2 -transfused3 units PRBC for the admission Hemoglobin stable  Diabetes mellitus type 2 -Check hemoglobin A1c-6.5 -NovoLog sliding scale -Holding glipizide, metformin, and invokana -will not restart metformin or invokana after d/c due to AKI -Glipizide restarted at reduced dose -CBGs controlled  Hyperlipidemia -Continue Pravachol -Check CPK--28  Hyperkalemia -due to AKI -improved with IVF  Discharge Instructions  Discharge Instructions    Diet - low sodium heart healthy    Complete by:  As directed    Increase activity slowly    Complete by:  As directed      Allergies as of 07/31/2017   No Known Allergies     Medication List    STOP taking these medications   aspirin EC 81 MG tablet   INVOKANA 300 MG Tabs tablet Generic drug:  canagliflozin   losartan 100 MG tablet Commonly known as:  COZAAR   metFORMIN 500 MG 24 hr tablet Commonly known as:  GLUCOPHAGE-XR     TAKE these medications   allopurinol 300 MG tablet Commonly known as:  ZYLOPRIM Take 300 mg by mouth every morning.   CENTRUM SILVER 50+MEN Tabs Take 1 tablet by mouth every morning.   cyclobenzaprine 10 MG tablet Commonly known as:  FLEXERIL Take 10 mg by mouth at bedtime.   glipiZIDE 2.5 MG 24 hr tablet Commonly known as:  GLIPIZIDE XL Take 1 tablet (2.5 mg total) by mouth daily with breakfast. What changed:  medication strength  how much to  take  when to take this   pravastatin 20 MG tablet Commonly known as:  PRAVACHOL Take 20 mg by mouth every morning.   sodium bicarbonate 650 MG tablet Take 1 tablet (650 mg total) by mouth 2 (two) times daily.      Follow-up Information    Fran Lowes, MD. Go in 2 day(s).   Specialty:  Nephrology Why:  come to office for blod work [ Friday 08/02/2017 Contact information: [address] [address] [place] On 08/01/2017.   Specialty:  Oncology Why:  at 8:30 am Contact information: [address] [phone] [place] [phone]         No Known Allergies  Consultations:  Oncology  Nephrology   Procedures/Studies: Ct Abdomen Pelvis Wo Contrast  Result Date: 07/23/2017 CLINICAL DATA:  Mid chest on lower back pain with movement for 1 week, tachycardia, pale nose, history of renal cell carcinoma post RIGHT nephrectomy, diabetes mellitus, prior cholecystectomy EXAM: CT ABDOMEN AND PELVIS WITHOUT CONTRAST TECHNIQUE: Multidetector CT imaging of the abdomen and pelvis was performed following the standard protocol without IV contrast. Sagittal and coronal MPR images reconstructed from axial data set. No oral contrast was administered. COMPARISON:  CT chest abdomen pelvis 01/25/2009 FINDINGS: Lower chest: Linear subsegmental atelectasis in BILATERAL lower lobes. Hepatobiliary: Gallbladder surgically absent. No focal hepatic abnormality. Pancreas: Normal appearance Spleen: Normal appearance. Small splenule adjacent to medial border of spleen Adrenals/Urinary Tract: Adrenal glands normal. Prior RIGHT nephrectomy. Enlarged LEFT kidney consistent with compensatory hypertrophy. Large cyst at inferior pole 10.4 x 7.3 x 6.5 cm increased since previous study when  it measured 6.0 x 4.4 x 5.5 cm. Ureters and bladder unremarkable. Stomach/Bowel: Normal appendix. Redundant sigmoid loop. Stomach and  bowel loops otherwise unremarkable. Vascular/Lymphatic: Atherosclerotic calcifications aorta, iliac arteries, and coronary arteries. Aorta normal caliber. No adenopathy. Reproductive: Unremarkable prostate gland and seminal vesicles Other: No free air or free fluid. Tiny inguinal hernias containing fat. No ventral hernia seen. Musculoskeletal: Scattered degenerative disc disease changes lower lumbar spine greatest at L3-L4. A number of question a subtle lytic lesions are identified within thoracolumbar vertebra, subtle lytic metastases not excluded. Fracture identified at the LEFT transverse process of L3, new since 2010. IMPRESSION: Interval increase in size of a large cyst the inferior pole of the LEFT kidney 10.4 cm greatest size. Questionable subtle lytic lesions within thoracic and lumbar vertebra; consider follow-up MR assessment to exclude subtle lytic metastases. Interval fracture of the LEFT transverse process of L3 since 2010. Tiny BILATERAL inguinal hernias containing fat. Electronically Signed   By: Lavonia Dana M.D.   On: 07/23/2017 15:25   Dg Chest 2 View  Result Date: 07/23/2017 CLINICAL DATA:  Chest pain, tachycardia. EXAM: CHEST  2 VIEW COMPARISON:  Radiographs of February 24, 2004. FINDINGS: Stable cardiomediastinal silhouette. No pneumothorax or pleural effusion is noted. No acute pulmonary disease is noted. Multilevel degenerative disc disease is noted in the midthoracic spine. IMPRESSION: No active cardiopulmonary disease. Electronically Signed   By: Marijo Conception, M.D.   On: 07/23/2017 14:57   US Renal  Result Date: 07/24/2017 CLINICAL DATA:  Acute renal failure. LEFT flank pain. Cholecystectomy. EXAM: RENAL / URINARY TRACT ULTRASOUND COMPLETE COMPARISON:  CT 07/23/2017, CT 12/29/2004 FINDINGS: Right Kidney: Length: Post nephrectomy 1998. Echogenicity within normal limits. No mass or hydronephrosis visualized. Left Kidney: Length: Compensatory hypertrophy of the LEFT kidney measuring 17  mm. No hydronephrosis or mass identified. Large anechoic cyst extends from the lower pole of the LEFT kidney measuring 9 cm in greatest dimension compared to 4.7 cm on CT 12/29/2004. Lesions has no worrisome characteristics by ultrasound or CT of 07/23/2017 Bladder: Normal with single jet IMPRESSION: 1. RIGHT nephrectomy. 2. Compensatory hypertrophy of the LEFT kidney. No mass lesion or obstruction. 3. Benign -appearing cyst exophytic from the lower pole of the LEFT kidney. 4. Normal bladder. Electronically Signed   By: Suzy Bouchard M.D.   On: 07/24/2017 11:12       Subjective: No shortness of breath or chest pain. No evidence of bleeding.  Discharge Exam: Vitals:   07/30/17 2127 07/31/17 0559  BP: (!) 133/56 (!) 145/72  Pulse: 87 88  Resp: 15 19  Temp: 98.8 F (37.1 C) 97.6 F (36.4 C)  SpO2: 100% 100%   Vitals:   07/30/17 1245 07/30/17 1830 07/30/17 2127 07/31/17 0559  BP: (!) 137/57 138/62 (!) 133/56 (!) 145/72  Pulse: 84 82 87 88  Resp: _0 Temp: 98.4 F (36.9 C) 98.7 F (37.1 C) 98.8 F (37.1 C) 97.6 F (36.4 C)  TempSrc: Oral Oral Oral Oral  SpO2: 100% 100% 100% 100%  Weight:      Height:        General: Pt is alert, awake, not in acute distress Cardiovascular: RRR, S1/S2 +, no rubs, no gallops Respiratory: CTA bilaterally, no wheezing, no rhonchi Abdominal: Soft, NT, ND, bowel sounds + Extremities: no edema, no cyanosis    The results of significant diagnostics from this hospitalization (including imaging, microbiology, ancillary and laboratory) are listed below for reference.     Microbiology: No results  found for this or any previous visit (from the past 240 hour(s)).   Labs: BNP (last 3 results)  Recent Labs  07/23/17 1406  BNP 40.9   Basic Metabolic Panel:  Recent Labs Lab 07/27/17 0528 07/28/17 0618 07/29/17 0745 07/30/17 0419 07/31/17 0508  NA 141 138 138 139 141  K 4.9 4.3 4.3 4.4 4.6  CL 111 106 108 112* 116*  CO2 17*  19* 16* 16* 16*  GLUCOSE 112* 104* 98 113* 96  BUN 61* 60* 65* 65* 59*  CREATININE 6.59* 6.73* 6.98* 6.59* 5.98*  CALCIUM 11.1* 9.9 8.9 8.2* 8.3*  PHOS 7.6* 6.8* 6.3* 5.7* 4.9*   Liver Function Tests:  Recent Labs Lab 07/27/17 0528 07/28/17 0618 07/29/17 0745 07/30/17 0419 07/31/17 0508  ALBUMIN 3.6 3.7 3.4* 3.4* 3.5   No results for input(s): LIPASE, AMYLASE in the last 168 hours. No results for input(s): AMMONIA in the last 168 hours. CBC:  Recent Labs Lab 07/25/17 0447 07/27/17 0528 07/29/17 0745 07/30/17 0419 07/30/17 1928 07/31/17 0508  WBC 8.3 7.1 6.8 6.9  --  7.3  NEUTROABS  --   --  3.6  --   --   --   HGB 8.7* 8.6* 7.9* 6.2* 8.1* 7.9*  HCT 25.3* 25.2* 23.8* 18.1* 24.1* 23.5*  MCV 90.4 89.7 91.5 90.5  --  89.4  PLT 41* 36* 35* 58*  --  28*   Cardiac Enzymes: No results for input(s): CKTOTAL, CKMB, CKMBINDEX, TROPONINI in the last 168 hours. BNP: Invalid input(s): POCBNP CBG:  Recent Labs Lab 07/30/17 1156 07/30/17 1619 07/30/17 2111 07/31/17 0806 07/31/17 1114  GLUCAP 153* 95 149* 84 141*   D-Dimer No results for input(s): DDIMER in the last 72 hours. Hgb A1c No results for input(s): HGBA1C in the last 72 hours. Lipid Profile No results for input(s): CHOL, HDL, LDLCALC, TRIG, CHOLHDL, LDLDIRECT in the last 72 hours. Thyroid function studies No results for input(s): TSH, T4TOTAL, T3FREE, THYROIDAB in the last 72 hours.  Invalid input(s): FREET3 Anemia work up  Recent Labs  07/31/17 0508  FERRITIN 911*  TIBC 251  IRON 102   Urinalysis    Component Value Date/Time   COLORURINE STRAW (A) 07/23/2017 2251   APPEARANCEUR CLEAR 07/23/2017 2251   LABSPEC 1.014 07/23/2017 2251   PHURINE 6.0 07/23/2017 2251   GLUCOSEU >=500 (A) 07/23/2017 2251   HGBUR NEGATIVE 07/23/2017 2251   BILIRUBINUR NEGATIVE 07/23/2017 2251   KETONESUR NEGATIVE 07/23/2017 2251   PROTEINUR 100 (A) 07/23/2017 2251   NITRITE NEGATIVE 07/23/2017 2251   LEUKOCYTESUR  NEGATIVE 07/23/2017 2251   Sepsis Labs Invalid input(s): PROCALCITONIN,  WBC,  LACTICIDVEN Microbiology No results found for this or any previous visit (from the past 240 hour(s)).   Time coordinating discharge: Over 30 minutes  SIGNED:   Kathie Dike, MD  [facility] 07/31/2017, 7:09 PM Pager   If 7PM-7AM, please contact night-coverage [email or web address] Password [email or web address]

## 2017-07-31 NOTE — Progress Notes (Signed)
Pt removed IV, currently without access, MD aware.

## 2017-07-31 NOTE — Progress Notes (Signed)
Pt's platelet count low this a.m., MD made aware, will continue to monitor.

## 2017-07-31 NOTE — Care Management Important Message (Signed)
Important Message  Patient Details  Name: Tom Marshall MRN: [id number] Date of Birth: 1951-04-05   Medicare Important Message Given:  Yes    Sherald Barge, RN 07/31/2017, 10:10 AM

## 2017-07-31 NOTE — Care Management Note (Signed)
Case Management Note  Patient Details  Name: Tom Marshall MRN: [id number] Date of Birth: May 01, 1951  Expected Discharge Date:  07/24/17               Expected Discharge Plan:  Home/Self Care  In-House Referral:  NA  Discharge planning Services  CM Consult  Post Acute Care Choice:  NA Choice offered to:  NA  Status of Service:  Completed, signed off  Additional Comments: Discharging home today with self care. He has no questions or concerns about DC plans anxious about change to medications, CM ensured him RN would go over changes and provide him with written instructions to take home. Pt enrolled in emmi transition calls. Calls explained to pt and flyer given.   Sherald Barge, RN 07/31/2017, 10:21 AM

## 2017-07-31 NOTE — Progress Notes (Signed)
Subjective: Interval History: Patient is feeling good and no complaint  Objective: Vital signs in last 24 hours: Temp:  [97.6 F (36.4 C)-98.8 F (37.1 C)] 97.6 F (36.4 C) (10/03 0559) Pulse Rate:  [82-88] 88 (10/03 0559) Resp:  [15-20] 19 (10/03 0559) BP: (133-145)/(56-72) 145/72 (10/03 0559) SpO2:  [100 %] 100 % (10/03 0559) Weight change:   Intake/Output from previous day: 10/02 0701 - 10/03 0700 In: 605 [P.O.:240; Blood:365] Out: 1600 [Urine:1600] Intake/Output this shift: No intake/output data recorded.  General appearance: alert, cooperative and no distress Resp: clear to auscultation bilaterally Cardio: regular rate and rhythm, S1, S2 normal, no murmur, click, rub or gallop Extremities: no edema, redness or tenderness in the calves or thighs  Lab Results:  Recent Labs  07/30/17 0419 07/30/17 1928 07/31/17 0508  WBC 6.9  --  7.3  HGB 6.2* 8.1* 7.9*  HCT 18.1* 24.1* 23.5*  PLT 58*  --  28*   BMET:   Recent Labs  07/30/17 0419 07/31/17 0508  NA 139 141  K 4.4 4.6  CL 112* 116*  CO2 16* 16*  GLUCOSE 113* 96  BUN 65* 59*  CREATININE 6.59* 5.98*  CALCIUM 8.2* 8.3*   No results for input(s): PTH in the last 72 hours. Iron Studies: No results for input(s): IRON, TIBC, TRANSFERRIN, FERRITIN in the last 72 hours.  Studies/Results: No results found.  I have reviewed the patient's current medications.  Assessment/Plan: Problem #1 acute kidney injury: At this moment seems to be multifactorial including prerenal syndrome/hypercalcemia/NSAIDS/ARBS/Invokana. His renal function Continued to improve. Patient presently asymptomatic. He had about 1600 mL of urine output was the last 24 hours. Problem #2 hypercalcemia: His calcium is normal. His phosphorus is Continuing to improve. Presently has come down to a target goal. Remains on binder Problem #3 history of hypertension: His blood pressure is reasonably controlled Problem #4 history of right renal CA :status  post nephrectomy Problem #5 history of left renal cyst. At this moment the cyst shows some increase in size from before. Hence need to rule out recurrence of his renal CA Problem #6 anemia: His hemoglobin is low . Patient status post blood transfusion. His iron studies are pending Problem #7 low CO2: Possibly metabolic. Patient is on sodium bicarbonate and his CO2 is stable. Plan: 1] We'll DC phosphorus binder 2] We will check his Renal panel on Friday as an outpatient it is going to be discharged today 3] We will continue with sodium bicarbonate 4] patient doesn't require dialysis for now.  LOS: 8 days   Zerina Hallinan S 07/31/2017,8:58 AM

## 2017-08-01 ENCOUNTER — Telehealth (HOSPITAL_COMMUNITY): Payer: Self-pay | Admitting: Emergency Medicine

## 2017-08-01 ENCOUNTER — Ambulatory Visit (HOSPITAL_COMMUNITY): Payer: Medicare Other

## 2017-08-01 NOTE — Telephone Encounter (Signed)
Spoke with Velta Addison about the importance of coming to his appt with the CC.  RN explained that we need to see what's going on with the pt and then we can start treatment.  With what the doctors think is going on, once he starts treatment he symptoms he is having will get better.  But the patient has to show up for his appt so that we can get testing completed to figure out exactly what is wrong with him.  Sister said pt is very stubborn.  Rn explained that she would call and speak with him if she needed me too.  She was thankful and agreed to come to the appt on 08/02/2017 at 1 pm.

## 2017-08-02 ENCOUNTER — Encounter (HOSPITAL_COMMUNITY): Payer: Medicare Other

## 2017-08-02 ENCOUNTER — Encounter (HOSPITAL_COMMUNITY): Payer: Self-pay

## 2017-08-02 ENCOUNTER — Other Ambulatory Visit (HOSPITAL_COMMUNITY): Payer: Self-pay | Admitting: Oncology

## 2017-08-02 ENCOUNTER — Ambulatory Visit (HOSPITAL_COMMUNITY)
Admission: RE | Admit: 2017-08-02 | Discharge: 2017-08-02 | Disposition: A | Discharge status: Home / Self Care | Payer: Medicare Other | Source: Ambulatory Visit | Attending: Oncology | Admitting: Oncology

## 2017-08-02 ENCOUNTER — Encounter (HOSPITAL_COMMUNITY): Payer: Medicare Other | Attending: Oncology | Admitting: Oncology

## 2017-08-02 VITALS — BP 136/56 | HR 100 | Resp 20 | Ht 69.0 in | Wt 221.5 lb

## 2017-08-02 DIAGNOSIS — D472 Monoclonal gammopathy: Secondary | ICD-10-CM | POA: Diagnosis not present

## 2017-08-02 DIAGNOSIS — N179 Acute kidney failure, unspecified: Secondary | ICD-10-CM

## 2017-08-02 DIAGNOSIS — D61818 Other pancytopenia: Secondary | ICD-10-CM

## 2017-08-02 LAB — CBC WITH DIFFERENTIAL/PLATELET
BASOS ABS: 0.1 10*3/uL (ref 0.0–0.1)
BASOS PCT: 1 %
EOS PCT: 1 %
Eosinophils Absolute: 0.1 10*3/uL (ref 0.0–0.7)
HEMATOCRIT: 25.4 % — AB (ref 39.0–52.0)
HEMOGLOBIN: 8.4 g/dL — AB (ref 13.0–17.0)
LYMPHS PCT: 27 %
Lymphs Abs: 2.4 10*3/uL (ref 0.7–4.0)
MCH: 30.4 pg (ref 26.0–34.0)
MCHC: 33.1 g/dL (ref 30.0–36.0)
MCV: 92 fL (ref 78.0–100.0)
MONOS PCT: 10 %
Monocytes Absolute: 0.9 10*3/uL (ref 0.1–1.0)
NEUTROS ABS: 5.3 10*3/uL (ref 1.7–7.7)
Neutrophils Relative %: 61 %
Platelets: 47 10*3/uL — ABNORMAL LOW (ref 150–400)
RBC: 2.76 MIL/uL — ABNORMAL LOW (ref 4.22–5.81)
RDW: 15.5 % (ref 11.5–15.5)
WBC: 8.8 10*3/uL (ref 4.0–10.5)

## 2017-08-02 LAB — RENAL FUNCTION PANEL
ANION GAP: 14 (ref 5–15)
Albumin: 3.7 g/dL (ref 3.5–5.0)
BUN: 46 mg/dL — ABNORMAL HIGH (ref 6–20)
CHLORIDE: 108 mmol/L (ref 101–111)
CO2: 16 mmol/L — AB (ref 22–32)
Calcium: 8 mg/dL — ABNORMAL LOW (ref 8.9–10.3)
Creatinine, Ser: 5.55 mg/dL — ABNORMAL HIGH (ref 0.61–1.24)
GFR calc Af Amer: 11 mL/min — ABNORMAL LOW (ref >60)
GFR calc non Af Amer: 10 mL/min — ABNORMAL LOW (ref >60)
GLUCOSE: 251 mg/dL — AB (ref 65–99)
POTASSIUM: 4.1 mmol/L (ref 3.5–5.1)
Phosphorus: 3.9 mg/dL (ref 2.5–4.6)
Sodium: 138 mmol/L (ref 135–145)

## 2017-08-02 NOTE — Progress Notes (Signed)
[facility] Initial Visit:  Patient Care Team: Asencion Noble, MD as PCP - General (Internal Medicine)  CHIEF COMPLAINTS/PURPOSE OF CONSULTATION:  Monoclonal gammopathy  HISTORY OF PRESENTING ILLNESS: Tom Marshall 66 y.o. 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 Patient was initially admitted to [facility] from 07/23/2017 through 07/31/2017 with a 10 day history of lower back pain, fatigue, pallor. CT of the abdomen and pelvis in the emergency department revealed Interval increase in size of a large cyst the inferior pole of the left kidney 10.4 cm greatest size, and a number of questionable lytic lesions are identified within thoracolumbar vertebra, subtle lytic metastases not excluded. Fracture identified at the left transverse process of L3, new since 2010.The patient was also noted to be in acute kidney injury with serum creatinine of 7.34, calcium 11.7. (baseline creatinine 0.8-1.0 from July 2018) CBC on 07/23/2017 demonstrated WBC 8.2K, hemoglobin 7.9 g/dL, hematocrit 23%, platelet count 56K. SPEP 07/24/17 demonstrated two peaks in the beta-gamma region which may represent monoclonal protein. Immunofixation shows IgA monoclonal protein with kappa light chain specificity. Free kappa light chains 14,886.4, lambda light chains 10.2, free kappa/lambda light chain ratio 1459.45. Immunoglobulins demonstrated IgA 788, IgG 261. Urine IFE demonstrated Bence Jones Protein positive; kappa type. He received pamidronate on 07/26/17 with improvement in his hypercalcemia. At the time of discharge on 07/31/2017 CBC demonstrated WBC 7.3K, hemoglobin 7.9 g/dL, platelet count 28K. Serum iron levels were normal, ferritin was elevated at 911. At the time of discharge his creatinine was 5.98, calcium 8.3.  Today patient presents with his sister. He states that he has been doing well except for fatigue. He states his back pain has improved. He denies any bone pain anywhere  else. He denies any chest pain, shortness of breath, abdominal pain, focal weakness.  Review of Systems - Oncology ROS as per HPI otherwise 12 point ROS is negative.  MEDICAL HISTORY: Past Medical History:  Diagnosis Date  . 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 Cervical dystonia   . Diabetes mellitus without complication ([place])     SURGICAL HISTORY: Past Surgical History:  Procedure Laterality Date  . CHOLECYSTECTOMY  2007  . NEPHRECTOMY Right 1998   cancer    SOCIAL HISTORY: Social History   Social History  . Marital status: Single    Spouse name: N/A  . Number of children: N/A  . Years of education: N/A   Occupational History  . Part-time Clinical cytogeneticist, Insurance underwriter    Social History Main Topics  . Smoking status: Never Smoker  . Smokeless tobacco: Current User    Types: Chew  . Alcohol use No  . Drug use: No  . Sexual activity: Not on file   Other Topics Concern  . Not on file   Social History Narrative  . No narrative on file    FAMILY HISTORY Family History  Problem Relation Age of Onset  . Heart failure Mother 22  . Dementia Father     ALLERGIES:  has No Known Allergies.  MEDICATIONS:  Current Outpatient Prescriptions  Medication Sig Dispense Refill  . glipiZIDE (GLIPIZIDE XL) 2.5 MG 24 hr tablet Take 1 tablet (2.5 mg total) by mouth daily with breakfast. 30 tablet 0  . Multiple Vitamins-Minerals (CENTRUM SILVER 50+MEN) TABS Take 1 tablet by mouth every morning.    . sodium bicarbonate 650 MG tablet Take 1 tablet (650 mg total) by mouth 2 (two) times daily. 60 tablet 1  . allopurinol (ZYLOPRIM) 300  MG tablet Take 300 mg by mouth every morning.    . cyclobenzaprine (FLEXERIL) 10 MG tablet Take 10 mg by mouth at bedtime.    . pravastatin (PRAVACHOL) 20 MG tablet Take 20 mg by mouth every morning.     No current facility-administered medications for this visit.     PHYSICAL EXAMINATION:  ECOG PERFORMANCE STATUS: 1 - Symptomatic but completely  ambulatory   Vitals:   08/02/17 1313  BP: (!) 136/56  Pulse: 100  Resp: 20  SpO2: 100%    Filed Weights   08/02/17 1313  Weight: 221 lb 8 oz (100.5 kg)     Physical Exam Constitutional: Well-developed, well-nourished, and in no distress.   HENT:  Head: Normocephalic and atraumatic.  Mouth/Throat: No oropharyngeal exudate. Mucosa moist. Eyes: Pupils are equal, round, and reactive to light. Conjunctivae are normal. No scleral icterus.  Neck: Normal range of motion. Neck supple. No JVD present.  Cardiovascular: Normal rate, regular rhythm and normal heart sounds.  Exam reveals no gallop and no friction rub.   No murmur heard. Pulmonary/Chest: Effort normal and breath sounds normal. No respiratory distress. No wheezes.No rales.  Abdominal: Soft. Obese. Bowel sounds are normal. No distension. There is no tenderness. There is no guarding.  Musculoskeletal: No edema or tenderness.  Lymphadenopathy:    No cervical or supraclavicular adenopathy.  Neurological: Alert and oriented to person, place, and time. No cranial nerve deficit.  Skin: Skin is warm and dry. No rash noted. No erythema. No pallor.  Psychiatric: Affect and judgment normal.   LABORATORY DATA: I have personally reviewed the data as listed:  Admission on 07/23/2017, Discharged on 07/31/2017  No results displayed because visit has over 200 results.      RADIOGRAPHIC STUDIES: I have personally reviewed the radiological images as listed and agree with the findings in the report  No results found.  ASSESSMENT/PLAN IgA Kappa monoclonal gammapathy. 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   Plan: I have reviewed patient's previous workup in the hospital with him and his sister in detial today. High suspicion for active MM. Patient has all the CRAB symptoms. I have ordered a skeletal survey for evaluation for lytic bone disease. PET scan to evaluate for  metastatic disease. Stat IR consult for bone marrow biopsy for definitive diagnosis. RTC in 3 weeks for follow up and review of the above ordered tests.  Repeat MM labs and CBC and CMP on next visit. Likely will plan to start him on induction treatment with Revlimid, Velcade, Decadron (RVD)  Orders Placed This Encounter  Procedures  . DG Bone Survey Met    Standing Status:   Future    Number of Occurrences:   1    Standing Expiration Date:   10/02/2018    Order Specific Question:   Reason for Exam (SYMPTOM  OR DIAGNOSIS REQUIRED)    Answer:   evaluate for lytic lesions    Order Specific Question:   Preferred imaging location?    Answer:   [facility]    Order Specific Question:   Radiology Contrast Protocol - do NOT remove file path    Answer:   \\[email or web address]\Radiant\DXFluoroContrastProtocols.pdf  . CT BIOPSY    Standing Status:   Future    Standing Expiration Date:   11/02/2018    Order Specific Question:   Lab orders requested (DO NOT place separate lab orders, these will be automatically ordered during procedure specimen collection):    Answer:  Surgical Pathology    Comments:   surgical path, flow cytometry, cytogenetics    Order Specific Question:   Lab orders requested (DO NOT place separate lab orders, these will be automatically ordered during procedure specimen collection):    Answer:   Other    Order Specific Question:   Reason for Exam (SYMPTOM  OR DIAGNOSIS REQUIRED)    Answer:   IgA kappa monoclonal gammopathy, bone marrow bx for diagnosis    Order Specific Question:   Preferred imaging location?    Answer:   [facility]    Order Specific Question:   Radiology Contrast Protocol - do NOT remove file path    Answer:   \\[email or web address]\[email or web address]\Radiant\CTProtocols.pdf  . NM PET Image Initial (PI) Skull Base To Thigh    Standing Status:   Future    Standing Expiration Date:   08/02/2018    Order Specific Question:   If indicated for the ordered procedure, I  authorize the administration of a radiopharmaceutical per Radiology protocol    Answer:   Yes    Order Specific Question:   Preferred imaging location?    Answer:   [facility]    Order Specific Question:   Radiology Contrast Protocol - do NOT remove file path    Answer:   \\[email or web address]\Radiant\NMPROTOCOLS.pdf  . CBC with Differential    Standing Status:   Future    Standing Expiration Date:   08/02/2018  . Comprehensive metabolic panel    Standing Status:   Future    Standing Expiration Date:   08/02/2018  . Renal function panel    Standing Status:   Future    Number of Occurrences:   1    Standing Expiration Date:   08/02/2018  . CBC with Differential    Standing Status:   Future    Number of Occurrences:   1    Standing Expiration Date:   08/02/2018    All questions were answered. The patient knows to call the clinic with any problems, questions or concerns.  This note was electronically signed.    Twana First, MD  08/02/2017 5:32 PM

## 2017-08-06 DIAGNOSIS — Z79899 Other long term (current) drug therapy: Secondary | ICD-10-CM | POA: Diagnosis not present

## 2017-08-06 DIAGNOSIS — I10 Essential (primary) hypertension: Secondary | ICD-10-CM | POA: Diagnosis not present

## 2017-08-06 DIAGNOSIS — N185 Chronic kidney disease, stage 5: Secondary | ICD-10-CM | POA: Diagnosis not present

## 2017-08-12 ENCOUNTER — Other Ambulatory Visit (HOSPITAL_COMMUNITY): Payer: Self-pay | Admitting: Oncology

## 2017-08-12 DIAGNOSIS — C9000 Multiple myeloma not having achieved remission: Secondary | ICD-10-CM

## 2017-08-13 ENCOUNTER — Ambulatory Visit (HOSPITAL_COMMUNITY)
Admission: RE | Admit: 2017-08-13 | Discharge: 2017-08-13 | Disposition: A | Discharge status: Home / Self Care | Payer: Medicare Other | Source: Ambulatory Visit | Attending: Oncology | Admitting: Oncology

## 2017-08-13 ENCOUNTER — Ambulatory Visit (HOSPITAL_COMMUNITY): Payer: Medicare Other

## 2017-08-13 DIAGNOSIS — I2510 Atherosclerotic heart disease of native coronary artery without angina pectoris: Secondary | ICD-10-CM | POA: Diagnosis not present

## 2017-08-13 DIAGNOSIS — R9389 Abnormal findings on diagnostic imaging of other specified body structures: Secondary | ICD-10-CM | POA: Diagnosis not present

## 2017-08-13 DIAGNOSIS — C9000 Multiple myeloma not having achieved remission: Secondary | ICD-10-CM | POA: Diagnosis not present

## 2017-08-13 DIAGNOSIS — I700 Atherosclerosis of aorta: Secondary | ICD-10-CM | POA: Insufficient documentation

## 2017-08-13 LAB — GLUCOSE, CAPILLARY: GLUCOSE-CAPILLARY: 148 mg/dL — AB (ref 65–99)

## 2017-08-13 MED ORDER — FLUDEOXYGLUCOSE F - 18 (FDG) INJECTION
10.6900 | Freq: Once | INTRAVENOUS | Status: AC | PRN
  Administered 2017-08-13: 10.69 via INTRAVENOUS

## 2017-08-14 ENCOUNTER — Other Ambulatory Visit: Payer: Self-pay | Admitting: Radiology

## 2017-08-14 DIAGNOSIS — N185 Chronic kidney disease, stage 5: Secondary | ICD-10-CM | POA: Diagnosis not present

## 2017-08-14 DIAGNOSIS — E875 Hyperkalemia: Secondary | ICD-10-CM | POA: Diagnosis not present

## 2017-08-14 DIAGNOSIS — E872 Acidosis: Secondary | ICD-10-CM | POA: Diagnosis not present

## 2017-08-15 ENCOUNTER — Ambulatory Visit (HOSPITAL_COMMUNITY)
Admission: RE | Admit: 2017-08-15 | Discharge: 2017-08-15 | Disposition: A | Discharge status: Home / Self Care | Payer: Medicare Other | Source: Ambulatory Visit | Attending: Oncology | Admitting: Oncology

## 2017-08-15 ENCOUNTER — Encounter (HOSPITAL_COMMUNITY): Payer: Self-pay

## 2017-08-15 DIAGNOSIS — D649 Anemia, unspecified: Secondary | ICD-10-CM | POA: Diagnosis not present

## 2017-08-15 DIAGNOSIS — D472 Monoclonal gammopathy: Secondary | ICD-10-CM | POA: Diagnosis not present

## 2017-08-15 DIAGNOSIS — D696 Thrombocytopenia, unspecified: Secondary | ICD-10-CM | POA: Diagnosis not present

## 2017-08-15 DIAGNOSIS — C9000 Multiple myeloma not having achieved remission: Secondary | ICD-10-CM | POA: Diagnosis not present

## 2017-08-15 LAB — CBC WITH DIFFERENTIAL/PLATELET
Basophils Absolute: 0.1 10*3/uL (ref 0.0–0.1)
Basophils Relative: 1 %
EOS ABS: 0.1 10*3/uL (ref 0.0–0.7)
Eosinophils Relative: 1 %
HCT: 23.2 % — ABNORMAL LOW (ref 39.0–52.0)
Hemoglobin: 7.8 g/dL — ABNORMAL LOW (ref 13.0–17.0)
LYMPHS PCT: 28 %
Lymphs Abs: 2.6 10*3/uL (ref 0.7–4.0)
MCH: 30.2 pg (ref 26.0–34.0)
MCHC: 33.6 g/dL (ref 30.0–36.0)
MCV: 89.9 fL (ref 78.0–100.0)
MONOS PCT: 10 %
Monocytes Absolute: 0.9 10*3/uL (ref 0.1–1.0)
NEUTROS ABS: 5.7 10*3/uL (ref 1.7–7.7)
Neutrophils Relative %: 60 %
Platelets: 55 10*3/uL — ABNORMAL LOW (ref 150–400)
RBC: 2.58 MIL/uL — AB (ref 4.22–5.81)
RDW: 16.1 % — ABNORMAL HIGH (ref 11.5–15.5)
WBC: 9.4 10*3/uL (ref 4.0–10.5)
nRBC: 2 /100 WBC — ABNORMAL HIGH

## 2017-08-15 LAB — GLUCOSE, CAPILLARY: Glucose-Capillary: 130 mg/dL — ABNORMAL HIGH (ref 65–99)

## 2017-08-15 LAB — BASIC METABOLIC PANEL
ANION GAP: 12 (ref 5–15)
BUN: 51 mg/dL — AB (ref 6–20)
CO2: 17 mmol/L — ABNORMAL LOW (ref 22–32)
Calcium: 9.4 mg/dL (ref 8.9–10.3)
Chloride: 107 mmol/L (ref 101–111)
Creatinine, Ser: 5.27 mg/dL — ABNORMAL HIGH (ref 0.61–1.24)
GFR, EST AFRICAN AMERICAN: 12 mL/min — AB (ref >60)
GFR, EST NON AFRICAN AMERICAN: 10 mL/min — AB (ref >60)
Glucose, Bld: 147 mg/dL — ABNORMAL HIGH (ref 65–99)
POTASSIUM: 5.5 mmol/L — AB (ref 3.5–5.1)
SODIUM: 136 mmol/L (ref 135–145)

## 2017-08-15 LAB — PROTIME-INR
INR: 1.04
PROTHROMBIN TIME: 13.5 s (ref 11.4–15.2)

## 2017-08-15 MED ORDER — FENTANYL CITRATE (PF) 100 MCG/2ML IJ SOLN
INTRAMUSCULAR | Status: AC | PRN
  Administered 2017-08-15 (×2): 50 ug via INTRAVENOUS

## 2017-08-15 MED ORDER — SODIUM CHLORIDE 0.9 % IV SOLN
INTRAVENOUS | Status: DC
  Administered 2017-08-15: 10:00:00 via INTRAVENOUS

## 2017-08-15 MED ORDER — HYDROCODONE-ACETAMINOPHEN 5-325 MG PO TABS: 1.0000 | ORAL_TABLET | ORAL | Status: DC | PRN

## 2017-08-15 MED ORDER — FENTANYL CITRATE (PF) 100 MCG/2ML IJ SOLN
INTRAMUSCULAR | Status: AC
  Filled 2017-08-15: qty 2

## 2017-08-15 MED ORDER — LIDOCAINE HCL (PF) 1 % IJ SOLN
INTRAMUSCULAR | Status: AC | PRN
  Administered 2017-08-15: 20 mL

## 2017-08-15 MED ORDER — MIDAZOLAM HCL 2 MG/2ML IJ SOLN
INTRAMUSCULAR | Status: AC
  Filled 2017-08-15: qty 2

## 2017-08-15 MED ORDER — MIDAZOLAM HCL 2 MG/2ML IJ SOLN
INTRAMUSCULAR | Status: AC | PRN
  Administered 2017-08-15 (×2): 1 mg via INTRAVENOUS

## 2017-08-15 NOTE — Procedures (Signed)
Successful CT guided bone marrow biopsy.  2 aspirates and 1 core obtained.  Minimal blood loss and no immediate complication.

## 2017-08-15 NOTE — Consult Note (Signed)
Chief Complaint: Patient was seen in consultation today for  CT-guided bone marrow biopsy  Referring Physician(s): Zhou,Louise  Supervising Physician: Henn,A  Patient Status: [facility] - Out-pt  History of Present Illness: Tom Marshall is a 65 y.o. male with prior history of right renal cell Gerty, status post nephrectomy. He now presents with IgA kappa monoclonal gammopathy,pancytopenia,elevated creatinine, hypercalcemia and PET scan on 08/13/17 revealing extensive marrow hypermetabolism and heterogeneous density suspicious for marrow involvement by myeloma. He is scheduled today for CT guided bone marrow biopsy for further evaluation.              Past Medical History:  Diagnosis Date  . 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 Cervical dystonia   . Diabetes mellitus without complication [facility])     Past Surgical History:  Procedure Laterality Date  . CHOLECYSTECTOMY  2007  . NEPHRECTOMY Right 1998   cancer    Allergies: Patient has no known allergies.  Medications: Prior to Admission medications   Medication Sig Start Date End Date Taking? Authorizing Provider  allopurinol (ZYLOPRIM) 300 MG tablet Take 300 mg by mouth every morning.   Yes [provider]  cyclobenzaprine (FLEXERIL) 10 MG tablet Take 10 mg by mouth at bedtime.   Yes [provider]  glipiZIDE (GLIPIZIDE XL) 2.5 MG 24 hr tablet Take 1 tablet (2.5 mg total) by mouth daily with breakfast. 07/31/17  Yes Memon, Jolaine Artist, MD  Ibuprofen-Diphenhydramine Cit (ADVIL PM PO) Take by mouth.   Yes [provider]  Multiple Vitamins-Minerals (CENTRUM SILVER 50+MEN) TABS Take 1 tablet by mouth every morning.   Yes [provider]  sodium bicarbonate 650 MG tablet Take 1 tablet (650 mg total) by mouth 2 (two) times daily. 07/31/17  Yes Kathie Dike, MD  pravastatin (PRAVACHOL) 20 MG tablet Take 20 mg by mouth every morning.    [provider]     Family History  Problem  Relation Age of Onset  . Heart failure Mother 56  . Dementia Father     Social History   Social History  . Marital status: Single    Spouse name: N/A  . Number of children: N/A  . Years of education: N/A   Occupational History  . Part-time Clinical cytogeneticist, Insurance underwriter    Social History Main Topics  . Smoking status: Never Smoker  . Smokeless tobacco: Current User    Types: Chew  . Alcohol use No  . Drug use: No  . Sexual activity: Not Asked   Other Topics Concern  . None   Social History Narrative  . None      Review of Systems denies fever, headache, chest pain, dyspnea, cough, abdominal pain, nausea, vomiting or bleeding. He does have fatigue and intermittent back pain.  Vital Signs: BP (!) 173/79 (BP Location: Left Arm)   Pulse 86   Temp 98.2 F (36.8 C) (Oral)   Resp 18   SpO2 100%   Physical Exam awake, alert. Chest clear to auscultation bilaterally. Heart with regular rate and rhythm. Abdomen obese, soft, positive bowel sounds, nontender. Lower extremities with trace to 1+ pretibial edema bilaterally  Imaging: Ct Abdomen Pelvis Wo Contrast  Result Date: 07/23/2017 CLINICAL DATA:  Mid chest on lower back pain with movement for 1 week, tachycardia, pale nose, history of renal cell carcinoma post RIGHT nephrectomy, diabetes mellitus, prior cholecystectomy EXAM: CT ABDOMEN AND PELVIS WITHOUT CONTRAST TECHNIQUE: Multidetector CT imaging of the abdomen and pelvis was performed following the standard  protocol without IV contrast. Sagittal and coronal MPR images reconstructed from axial data set. No oral contrast was administered. COMPARISON:  CT chest abdomen pelvis 01/25/2009 FINDINGS: Lower chest: Linear subsegmental atelectasis in BILATERAL lower lobes. Hepatobiliary: Gallbladder surgically absent. No focal hepatic abnormality. Pancreas: Normal appearance Spleen: Normal appearance. Small splenule adjacent to medial border of spleen Adrenals/Urinary Tract: Adrenal glands normal.  Prior RIGHT nephrectomy. Enlarged LEFT kidney consistent with compensatory hypertrophy. Large cyst at inferior pole 10.4 x 7.3 x 6.5 cm increased since previous study when it measured 6.0 x 4.4 x 5.5 cm. Ureters and bladder unremarkable. Stomach/Bowel: Normal appendix. Redundant sigmoid loop. Stomach and bowel loops otherwise unremarkable. Vascular/Lymphatic: Atherosclerotic calcifications aorta, iliac arteries, and coronary arteries. Aorta normal caliber. No adenopathy. Reproductive: Unremarkable prostate gland and seminal vesicles Other: No free air or free fluid. Tiny inguinal hernias containing fat. No ventral hernia seen. Musculoskeletal: Scattered degenerative disc disease changes lower lumbar spine greatest at L3-L4. A number of question a subtle lytic lesions are identified within thoracolumbar vertebra, subtle lytic metastases not excluded. Fracture identified at the LEFT transverse process of L3, new since 2010. IMPRESSION: Interval increase in size of a large cyst the inferior pole of the LEFT kidney 10.4 cm greatest size. Questionable subtle lytic lesions within thoracic and lumbar vertebra; consider follow-up MR assessment to exclude subtle lytic metastases. Interval fracture of the LEFT transverse process of L3 since 2010. Tiny BILATERAL inguinal hernias containing fat. Electronically Signed   By: Lavonia Dana M.D.   On: 07/23/2017 15:25   Dg Chest 2 View  Result Date: 07/23/2017 CLINICAL DATA:  Chest pain, tachycardia. EXAM: CHEST  2 VIEW COMPARISON:  Radiographs of February 24, 2004. FINDINGS: Stable cardiomediastinal silhouette. No pneumothorax or pleural effusion is noted. No acute pulmonary disease is noted. Multilevel degenerative disc disease is noted in the midthoracic spine. IMPRESSION: No active cardiopulmonary disease. Electronically Signed   By: Marijo Conception, M.D.   On: 07/23/2017 14:57   US Renal  Result Date: 07/24/2017 CLINICAL DATA:  Acute renal failure. LEFT flank pain.  Cholecystectomy. EXAM: RENAL / URINARY TRACT ULTRASOUND COMPLETE COMPARISON:  CT 07/23/2017, CT 12/29/2004 FINDINGS: Right Kidney: Length: Post nephrectomy 1998. Echogenicity within normal limits. No mass or hydronephrosis visualized. Left Kidney: Length: Compensatory hypertrophy of the LEFT kidney measuring 17 mm. No hydronephrosis or mass identified. Large anechoic cyst extends from the lower pole of the LEFT kidney measuring 9 cm in greatest dimension compared to 4.7 cm on CT 12/29/2004. Lesions has no worrisome characteristics by ultrasound or CT of 07/23/2017 Bladder: Normal with single jet IMPRESSION: 1. RIGHT nephrectomy. 2. Compensatory hypertrophy of the LEFT kidney. No mass lesion or obstruction. 3. Benign -appearing cyst exophytic from the lower pole of the LEFT kidney. 4. Normal bladder. Electronically Signed   By: Suzy Bouchard M.D.   On: 07/24/2017 11:12   Nm Pet Image Initial (pi) Whole Body  Result Date: 08/13/2017 CLINICAL DATA:  Initial treatment strategy for staging of multiple myeloma. EXAM: NUCLEAR MEDICINE PET WHOLE BODY TECHNIQUE: 10.7 mCi F-18 FDG was injected intravenously. Full-ring PET imaging was performed from the vertex to the feet after the radiotracer. CT data was obtained and used for attenuation correction and anatomic localization. FASTING BLOOD GLUCOSE:  Value: 148 mg/dl COMPARISON:  Skeletal survey of 08/02/2017. Abdominopelvic CT 07/23/2017. Chest CT 01/25/2009. FINDINGS: HEAD/NECK No areas of abnormal hypermetabolism.  No cervical adenopathy. CHEST No pulmonary parenchymal or thoracic nodal hypermetabolism identified. Mild bilateral gynecomastia. Mild cardiomegaly with multivessel coronary  artery atherosclerosis. No thoracic adenopathy. Bibasilar scarring. ABDOMEN/PELVIS No abdominopelvic nodal or parenchymal hypermetabolism. Right nephrectomy. Normal adrenal glands. Abdominal aortic atherosclerosis. Caudate enlargement and medial segment left liver lobe atrophy.  Cholecystectomy. Lower pole left renal dominant 9.8 cm low-density lesion is most consistent with a cyst. Fat containing left larger than right inguinal hernias. Mild prostatomegaly. SKELETON Relatively diffuse heterogeneous marrow hypermetabolism. Example focus within the sternum measures a S.U.V. max of 6.7. Bilateral femoral shaft foci of hypermetabolism without well-defined CT correlate. Example in the right proximal femoral shaft at a S.U.V. max of 4.2. Heterogeneous marrow density is most apparent in the thoracic spine and bilateral ribs. Cortical irregularity within the tenth and eighth posterolateral left ribs, likely due to a pathologic fractures. Heterogeneous density within the left greater than right scapula with suggestion of cortical destruction on the left on image 76/series 4. EXTREMITIES No soft tissue hypermetabolism within the extremities. IMPRESSION: 1. Extensive marrow hypermetabolism and heterogeneous density, most consistent with relatively diffuse marrow involvement by myeloma. 2. No evidence of hypermetabolic soft tissue disease. 3. Coronary artery atherosclerosis. Aortic Atherosclerosis (ICD10-I70.0). 4. Hepatic morphology for which mild cirrhosis cannot be excluded. Correlate with risk factors. Electronically Signed   By: Abigail Miyamoto M.D.   On: 08/13/2017 14:20   Dg Bone Survey Met  Result Date: 08/02/2017 CLINICAL DATA:  Monoclonal gammopathy. EXAM: METASTATIC BONE SURVEY COMPARISON:  None. FINDINGS: Multiple x-rays of the axial and appendicular skeleton were provided. Generalized osteopenia. No lytic or sclerotic osseous lesion. No periosteal reaction or bone destruction. Degenerative disc disease with disc height loss at C3-4, C4-5, C5-6 and C6-7. 3 mm anterolisthesis of C4 on C5 secondary to facet disease. Degenerative disc disease with disc height loss at L3-4 and L5-S1. Bilateral diffuse mild interstitial thickening. No focal consolidation, pleural effusion or pneumothorax.  Stable cardiomegaly. Mild right medial femorotibial compartment osteoarthritis. Mild left medial femorotibial compartment osteoarthritis. IMPRESSION: No aggressive lytic or sclerotic osseous lesion involving the axial or appendicular skeleton. Electronically Signed   By: Kathreen Devoid   On: 08/02/2017 15:46    Labs:  CBC:  Recent Labs  07/29/17 0745 07/30/17 0419 07/30/17 1928 07/31/17 0508 08/02/17 1347  WBC 6.8 6.9  --  7.3 8.8  HGB 7.9* 6.2* 8.1* 7.9* 8.4*  HCT 23.8* 18.1* 24.1* 23.5* 25.4*  PLT 35* 58*  --  28* 47*    COAGS: No results for input(s): INR, APTT in the last 8760 hours.  BMP:  Recent Labs  07/29/17 0745 07/30/17 0419 07/31/17 0508 08/02/17 1346  NA 138 139 141 138  K 4.3 4.4 4.6 4.1  CL 108 112* 116* 108  CO2 16* 16* 16* 16*  GLUCOSE 98 113* 96 251*  BUN 65* 65* 59* 46*  CALCIUM 8.9 8.2* 8.3* 8.0*  CREATININE 6.98* 6.59* 5.98* 5.55*  GFRNONAA 7* 8* 9* 10*  GFRAA 8* 9* 10* 11*    LIVER FUNCTION TESTS:  Recent Labs  07/23/17 1406  07/29/17 0745 07/30/17 0419 07/31/17 0508 08/02/17 1346  BILITOT 0.9  --   --   --   --   --   AST 18  --   --   --   --   --   ALT 18  --   --   --   --   --   ALKPHOS 84  --   --   --   --   --   PROT 7.7  --   --   --   --   --  ALBUMIN 3.7  < > 3.4* 3.4* 3.5 3.7  < > = values in this interval not displayed.  TUMOR MARKERS: No results for input(s): AFPTM, CEA, CA199, CHROMGRNA in the last 8760 hours.  Assessment and Plan:  66 y.o. male with prior history of right renal cell carcinoma 1998, status post nephrectomy. He now presents with IgA kappa monoclonal gammopathy,pancytopenia,elevated creatinine, hypercalcemia and PET scan on 08/13/17 revealing extensive marrow hypermetabolism and heterogeneous density suspicious for marrow involvement by myeloma. He is scheduled today for CT guided bone marrow biopsy for further evaluation. Risks and benefits discussed with the patient/sister including, but not limited to  bleeding, infection, damage to adjacent structures or low yield requiring additional tests.All of the patient's questions were answered, patient is agreeable to proceed.Consent signed and in chart.                Thank you for this interesting consult.  I greatly enjoyed meeting TRACY KINNER and look forward to participating in their care.  A copy of this report was sent to the requesting provider on this date.  Electronically Signed: D. Rowe Robert, PA-C 08/15/2017, 9:24 AM   I spent a total of  25 minutes   in face to face in clinical consultation, greater than 50% of which was counseling/coordinating care for CT-guided bone marrow biopsy

## 2017-08-15 NOTE — Discharge Instructions (Signed)
Moderate Conscious Sedation, Adult, Care After °These instructions provide you with information about caring for yourself after your procedure. Your health care provider may also give you more specific instructions. Your treatment has been planned according to current medical practices, but problems sometimes occur. Call your health care provider if you have any problems or questions after your procedure. °What can I expect after the procedure? °After your procedure, it is common: °· To feel sleepy for several hours. °· To feel clumsy and have poor balance for several hours. °· To have poor judgment for several hours. °· To vomit if you eat too soon. ° °Follow these instructions at home: °For at least 24 hours after the procedure: ° °· Do not: °? Participate in activities where you could fall or become injured. °? Drive. °? Use heavy machinery. °? Drink alcohol. °? Take sleeping pills or medicines that cause drowsiness. °? Make important decisions or sign legal documents. °? Take care of children on your own. °· Rest. °Eating and drinking °· Follow the diet recommended by your health care provider. °· If you vomit: °? Drink water, juice, or soup when you can drink without vomiting. °? Make sure you have little or no nausea before eating solid foods. °General instructions °· Have a responsible adult stay with you until you are awake and alert. °· Take over-the-counter and prescription medicines only as told by your health care provider. °· If you smoke, do not smoke without supervision. °· Keep all follow-up visits as told by your health care provider. This is important. °Contact a health care provider if: °· You keep feeling nauseous or you keep vomiting. °· You feel light-headed. °· You develop a rash. °· You have a fever. °Get help right away if: °· You have trouble breathing. °This information is not intended to replace advice given to you by your health care provider. Make sure you discuss any questions you have  with your health care provider. °Document Released: 08/05/2013 Document Revised: 03/19/2016 Document Reviewed: 02/04/2016 °Elsevier Interactive Patient Education © 2018 [employer] °Bone Marrow Aspiration and Bone Marrow Biopsy, Adult, Care After °This sheet gives you information about how to care for yourself after your procedure. Your health care provider may also give you more specific instructions. If you have problems or questions, contact your health care provider. °What can I expect after the procedure? °After the procedure, it is common to have: °· Mild pain and tenderness. °· Swelling. °· Bruising. ° °Follow these instructions at home: °· Take over-the-counter or prescription medicines only as told by your health care provider. °· Do not take baths, swim, or use a hot tub until your health care provider approves. Ask if you can take a shower or have a sponge bath. °· Follow instructions from your health care provider about how to take care of the puncture site. Make sure you: °? Wash your hands with soap and water before you change your bandage (dressing). If soap and water are not available, use hand sanitizer. °? Change your dressing as told by your health care provider. °· Check your puncture site every day for signs of infection. Check for: °? More redness, swelling, or pain. °? More fluid or blood. °? Warmth. °? Pus or a bad smell. °· Return to your normal activities as told by your health care provider. Ask your health care provider what activities are safe for you. °· Do not drive for 24 hours if you were given a medicine to help you relax (sedative). °·   Keep all follow-up visits as told by your health care provider. This is important. °Contact a health care provider if: °· You have more redness, swelling, or pain around the puncture site. °· You have more fluid or blood coming from the puncture site. °· Your puncture site feels warm to the touch. °· You have pus or a bad smell coming from the  puncture site. °· You have a fever. °· Your pain is not controlled with medicine. °This information is not intended to replace advice given to you by your health care provider. Make sure you discuss any questions you have with your health care provider. °Document Released: 05/04/2005 Document Revised: 05/04/2016 Document Reviewed: 03/28/2016 °Elsevier Interactive Patient Education © 2018 Elsevier Inc. ° °

## 2017-08-23 ENCOUNTER — Other Ambulatory Visit (HOSPITAL_COMMUNITY): Payer: Medicare Other

## 2017-08-23 ENCOUNTER — Ambulatory Visit (HOSPITAL_COMMUNITY): Payer: Medicare Other | Admitting: Oncology

## 2017-08-26 DIAGNOSIS — E119 Type 2 diabetes mellitus without complications: Secondary | ICD-10-CM | POA: Diagnosis not present

## 2017-08-26 DIAGNOSIS — Z79899 Other long term (current) drug therapy: Secondary | ICD-10-CM | POA: Diagnosis not present

## 2017-08-26 DIAGNOSIS — I10 Essential (primary) hypertension: Secondary | ICD-10-CM | POA: Diagnosis not present

## 2017-08-29 ENCOUNTER — Encounter (HOSPITAL_COMMUNITY): Payer: Self-pay | Admitting: Oncology

## 2017-08-29 ENCOUNTER — Ambulatory Visit (HOSPITAL_COMMUNITY): Payer: Medicare Other

## 2017-08-29 ENCOUNTER — Encounter (HOSPITAL_BASED_OUTPATIENT_CLINIC_OR_DEPARTMENT_OTHER): Payer: Medicare Other | Admitting: Oncology

## 2017-08-29 ENCOUNTER — Encounter (HOSPITAL_COMMUNITY): Payer: Self-pay | Admitting: *Deleted

## 2017-08-29 ENCOUNTER — Other Ambulatory Visit: Payer: Self-pay

## 2017-08-29 ENCOUNTER — Encounter (HOSPITAL_COMMUNITY): Payer: Medicare Other | Attending: Oncology

## 2017-08-29 ENCOUNTER — Observation Stay (HOSPITAL_COMMUNITY)
Admission: EM | Admit: 2017-08-29 | Discharge: 2017-08-30 | Disposition: A | Discharge status: Home / Self Care | Payer: Medicare Other | Attending: Family Medicine | Admitting: Family Medicine

## 2017-08-29 VITALS — BP 132/63 | HR 100 | Temp 98.1°F | Resp 18 | Wt 211.6 lb

## 2017-08-29 DIAGNOSIS — D472 Monoclonal gammopathy: Secondary | ICD-10-CM | POA: Diagnosis not present

## 2017-08-29 DIAGNOSIS — F17290 Nicotine dependence, other tobacco product, uncomplicated: Secondary | ICD-10-CM | POA: Diagnosis not present

## 2017-08-29 DIAGNOSIS — E669 Obesity, unspecified: Secondary | ICD-10-CM | POA: Diagnosis present

## 2017-08-29 DIAGNOSIS — E1169 Type 2 diabetes mellitus with other specified complication: Secondary | ICD-10-CM | POA: Diagnosis not present

## 2017-08-29 DIAGNOSIS — N186 End stage renal disease: Secondary | ICD-10-CM

## 2017-08-29 DIAGNOSIS — Z7984 Long term (current) use of oral hypoglycemic drugs: Secondary | ICD-10-CM | POA: Diagnosis not present

## 2017-08-29 DIAGNOSIS — Z79899 Other long term (current) drug therapy: Secondary | ICD-10-CM | POA: Insufficient documentation

## 2017-08-29 DIAGNOSIS — C9000 Multiple myeloma not having achieved remission: Secondary | ICD-10-CM | POA: Diagnosis present

## 2017-08-29 DIAGNOSIS — N189 Chronic kidney disease, unspecified: Secondary | ICD-10-CM

## 2017-08-29 DIAGNOSIS — R000 Tachycardia, unspecified: Secondary | ICD-10-CM | POA: Insufficient documentation

## 2017-08-29 DIAGNOSIS — R531 Weakness: Secondary | ICD-10-CM | POA: Diagnosis present

## 2017-08-29 DIAGNOSIS — D696 Thrombocytopenia, unspecified: Secondary | ICD-10-CM | POA: Insufficient documentation

## 2017-08-29 DIAGNOSIS — E119 Type 2 diabetes mellitus without complications: Secondary | ICD-10-CM | POA: Diagnosis not present

## 2017-08-29 DIAGNOSIS — D61818 Other pancytopenia: Secondary | ICD-10-CM | POA: Diagnosis not present

## 2017-08-29 DIAGNOSIS — D649 Anemia, unspecified: Principal | ICD-10-CM | POA: Insufficient documentation

## 2017-08-29 LAB — CBC WITH DIFFERENTIAL/PLATELET
BASOS ABS: 0.1 10*3/uL (ref 0.0–0.1)
BASOS PCT: 1 %
Eosinophils Absolute: 0.1 10*3/uL (ref 0.0–0.7)
Eosinophils Relative: 2 %
HCT: 17.6 % — ABNORMAL LOW (ref 39.0–52.0)
HEMOGLOBIN: 5.9 g/dL — AB (ref 13.0–17.0)
Lymphocytes Relative: 35 %
Lymphs Abs: 2.3 10*3/uL (ref 0.7–4.0)
MCH: 30.6 pg (ref 26.0–34.0)
MCHC: 33.5 g/dL (ref 30.0–36.0)
MCV: 91.2 fL (ref 78.0–100.0)
MONOS PCT: 12 %
Monocytes Absolute: 0.8 10*3/uL (ref 0.1–1.0)
NEUTROS PCT: 50 %
Neutro Abs: 3.4 10*3/uL (ref 1.7–7.7)
Platelets: 25 10*3/uL — CL (ref 150–400)
RBC: 1.93 MIL/uL — AB (ref 4.22–5.81)
RDW: 16.9 % — ABNORMAL HIGH (ref 11.5–15.5)
WBC: 6.7 10*3/uL (ref 4.0–10.5)

## 2017-08-29 LAB — COMPREHENSIVE METABOLIC PANEL
ALBUMIN: 3.6 g/dL (ref 3.5–5.0)
ALK PHOS: 90 U/L (ref 38–126)
ALT: 19 U/L (ref 17–63)
AST: 17 U/L (ref 15–41)
Anion gap: 12 (ref 5–15)
BILIRUBIN TOTAL: 0.8 mg/dL (ref 0.3–1.2)
BUN: 47 mg/dL — AB (ref 6–20)
CALCIUM: 8.9 mg/dL (ref 8.9–10.3)
CO2: 19 mmol/L — AB (ref 22–32)
CREATININE: 5.52 mg/dL — AB (ref 0.61–1.24)
Chloride: 104 mmol/L (ref 101–111)
GFR calc Af Amer: 11 mL/min — ABNORMAL LOW (ref >60)
GFR calc non Af Amer: 10 mL/min — ABNORMAL LOW (ref >60)
GLUCOSE: 189 mg/dL — AB (ref 65–99)
Potassium: 4.8 mmol/L (ref 3.5–5.1)
Sodium: 135 mmol/L (ref 135–145)
TOTAL PROTEIN: 7 g/dL (ref 6.5–8.1)

## 2017-08-29 LAB — TROPONIN I

## 2017-08-29 LAB — PREPARE RBC (CROSSMATCH)

## 2017-08-29 LAB — GLUCOSE, CAPILLARY: GLUCOSE-CAPILLARY: 160 mg/dL — AB (ref 65–99)

## 2017-08-29 MED ORDER — SODIUM BICARBONATE 650 MG PO TABS
650.0000 mg | ORAL_TABLET | Freq: Three times a day (TID) | ORAL | Status: DC
  Administered 2017-08-29 – 2017-08-30 (×2): 650 mg via ORAL
  Filled 2017-08-29 (×2): qty 1

## 2017-08-29 MED ORDER — PROCHLORPERAZINE MALEATE 10 MG PO TABS: 10.0000 mg | ORAL_TABLET | Freq: Four times a day (QID) | ORAL | 1 refills | Status: DC | PRN

## 2017-08-29 MED ORDER — ALLOPURINOL 300 MG PO TABS
300.0000 mg | ORAL_TABLET | Freq: Every morning | ORAL | Status: DC
  Administered 2017-08-30: 300 mg via ORAL
  Filled 2017-08-29: qty 1

## 2017-08-29 MED ORDER — PROCHLORPERAZINE MALEATE 5 MG PO TABS: 10.0000 mg | ORAL_TABLET | Freq: Four times a day (QID) | ORAL | Status: DC | PRN

## 2017-08-29 MED ORDER — PRAVASTATIN SODIUM 10 MG PO TABS: 20.0000 mg | ORAL_TABLET | Freq: Every day | ORAL | Status: DC

## 2017-08-29 MED ORDER — SODIUM CHLORIDE 0.9% FLUSH
3.0000 mL | Freq: Two times a day (BID) | INTRAVENOUS | Status: DC
  Administered 2017-08-29 – 2017-08-30 (×2): 3 mL via INTRAVENOUS

## 2017-08-29 MED ORDER — GLIPIZIDE ER 2.5 MG PO TB24
2.5000 mg | ORAL_TABLET | Freq: Every day | ORAL | Status: DC
  Administered 2017-08-30: 2.5 mg via ORAL
  Filled 2017-08-29 (×4): qty 1

## 2017-08-29 MED ORDER — ACYCLOVIR 200 MG PO CAPS
ORAL_CAPSULE | ORAL | Status: AC
  Filled 2017-08-29: qty 2

## 2017-08-29 MED ORDER — ACYCLOVIR 400 MG PO TABS
400.0000 mg | ORAL_TABLET | Freq: Two times a day (BID) | ORAL | Status: DC
  Administered 2017-08-29: 400 mg via ORAL
  Filled 2017-08-29 (×2): qty 1

## 2017-08-29 MED ORDER — ACETAMINOPHEN 650 MG RE SUPP
650.0000 mg | Freq: Four times a day (QID) | RECTAL | Status: DC | PRN
Start: 2017-08-29 — End: 2017-08-30

## 2017-08-29 MED ORDER — CYCLOBENZAPRINE HCL 10 MG PO TABS
10.0000 mg | ORAL_TABLET | Freq: Every day | ORAL | Status: DC
  Administered 2017-08-29: 10 mg via ORAL
  Filled 2017-08-29: qty 1

## 2017-08-29 MED ORDER — DEXAMETHASONE 4 MG PO TABS: ORAL_TABLET | ORAL | 3 refills | Status: DC

## 2017-08-29 MED ORDER — SODIUM CHLORIDE 0.9 % IV SOLN: 250.0000 mL | INTRAVENOUS | Status: DC | PRN

## 2017-08-29 MED ORDER — ACYCLOVIR 400 MG PO TABS: 400.0000 mg | ORAL_TABLET | Freq: Two times a day (BID) | ORAL | 3 refills | Status: DC

## 2017-08-29 MED ORDER — SODIUM CHLORIDE 0.9% FLUSH: 3.0000 mL | INTRAVENOUS | Status: DC | PRN

## 2017-08-29 MED ORDER — ONDANSETRON HCL 8 MG PO TABS: 8.0000 mg | ORAL_TABLET | Freq: Two times a day (BID) | ORAL | 1 refills | Status: DC | PRN

## 2017-08-29 MED ORDER — SODIUM CHLORIDE 0.9 % IV SOLN
10.0000 mL/h | Freq: Once | INTRAVENOUS | Status: AC
  Administered 2017-08-29: 10 mL/h via INTRAVENOUS

## 2017-08-29 MED ORDER — ACETAMINOPHEN 325 MG PO TABS: 650.0000 mg | ORAL_TABLET | Freq: Four times a day (QID) | ORAL | Status: DC | PRN

## 2017-08-29 MED ORDER — SODIUM CHLORIDE 0.9 % IV SOLN
Freq: Once | INTRAVENOUS | Status: AC
  Administered 2017-08-29: 22:00:00 via INTRAVENOUS

## 2017-08-29 MED ORDER — LENALIDOMIDE 25 MG PO CAPS: 25.0000 mg | ORAL_CAPSULE | Freq: Every day | ORAL | 3 refills | Status: DC

## 2017-08-29 MED ORDER — ONDANSETRON HCL 4 MG PO TABS: 8.0000 mg | ORAL_TABLET | Freq: Two times a day (BID) | ORAL | Status: DC | PRN

## 2017-08-29 NOTE — ED Notes (Signed)
Pt sent from cancer center for low hbg. Pt alert and oriented in NAD on RN assessment.

## 2017-08-29 NOTE — Progress Notes (Signed)
[facility] Initial Visit:  Patient Care Team: Asencion Noble, MD as PCP - General (Internal Medicine) Fran Lowes, MD as Consulting Physician (Nephrology)  CHIEF COMPLAINTS/PURPOSE OF CONSULTATION:  Monoclonal gammopathy  HISTORY OF PRESENTING ILLNESS: Tom Marshall 66 y.o. 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 Patient was initially admitted to [facility] from 07/23/2017 through 07/31/2017 with a 10 day history of lower back pain, fatigue, pallor. CT of the abdomen and pelvis in the emergency department revealed Interval increase in size of a large cyst the inferior pole of the left kidney 10.4 cm greatest size, and a number of questionable lytic lesions are identified within thoracolumbar vertebra, subtle lytic metastases not excluded. Fracture identified at the left transverse process of L3, new since 2010.The patient was also noted to be in acute kidney injury with serum creatinine of 7.34, calcium 11.7. (baseline creatinine 0.8-1.0 from July 2018) CBC on 07/23/2017 demonstrated WBC 8.2K, hemoglobin 7.9 g/dL, hematocrit 23%, platelet count 56K. SPEP 07/24/17 demonstrated two peaks in the beta-gamma region which may represent monoclonal protein. Immunofixation shows IgA monoclonal protein with kappa light chain specificity. Free kappa light chains 14,886.4, lambda light chains 10.2, free kappa/lambda light chain ratio 1459.45. Immunoglobulins demonstrated IgA 788, IgG 261. Urine IFE demonstrated Bence Jones Protein positive; kappa type. He received pamidronate on 07/26/17 with improvement in his hypercalcemia. At the time of discharge on 07/31/2017 CBC demonstrated WBC 7.3K, hemoglobin 7.9 g/dL, platelet count 28K. Serum iron levels were normal, ferritin was elevated at 911. At the time of discharge his creatinine was 5.98, calcium 8.3.  INTERVAL HISTORY: Today patient presents with his sister to review bone marrow biopsy results and PET scan  results. He states that he has been doing well except for fatigue and somnolence. He states his back pain has improved. He denies any bone pain anywhere else. He denies any chest pain, palpitations,dizziness, shortness of breath, abdominal pain, focal weakness.  Review of Systems - Oncology ROS as per HPI otherwise 12 point ROS is negative.  MEDICAL HISTORY: Past Medical History:  Diagnosis Date  . Cancer of right kidney ([place])   . Cervical dystonia   . Diabetes mellitus without complication ([place])     SURGICAL HISTORY: Past Surgical History:  Procedure Laterality Date  . CHOLECYSTECTOMY  2007  . NEPHRECTOMY Right 1998   cancer    SOCIAL HISTORY: Social History   Social History  . Marital status: Single    Spouse name: N/A  . Number of children: N/A  . Years of education: N/A   Occupational History  . Part-time Clinical cytogeneticist, Insurance underwriter    Social History Main Topics  . Smoking status: Never Smoker  . Smokeless tobacco: Current User    Types: Chew  . Alcohol use No  . Drug use: No  . Sexual activity: Not on file   Other Topics Concern  . Not on file   Social History Narrative  . No narrative on file    FAMILY HISTORY Family History  Problem Relation Age of Onset  . Heart failure Mother 46  . Dementia Father     ALLERGIES:  has No Known Allergies.  MEDICATIONS:  Current Outpatient Prescriptions  Medication Sig Dispense Refill  . allopurinol (ZYLOPRIM) 300 MG tablet Take 300 mg by mouth every morning.    . cyclobenzaprine (FLEXERIL) 10 MG tablet Take 10 mg by mouth at bedtime.    [name] glipiZIDE (GLIPIZIDE XL) 2.5 MG 24 hr tablet Take 1 tablet (2.5  mg total) by mouth daily with breakfast. 30 tablet 0  . Ibuprofen-Diphenhydramine Cit (ADVIL PM PO) Take by mouth.    . Multiple Vitamins-Minerals (CENTRUM SILVER 50+MEN) TABS Take 1 tablet by mouth every morning.    . pravastatin (PRAVACHOL) 20 MG tablet Take 20 mg by mouth every morning.    . sodium bicarbonate 650 MG  tablet Take 1 tablet (650 mg total) by mouth 2 (two) times daily. 60 tablet 1   No current facility-administered medications for this visit.     PHYSICAL EXAMINATION:  ECOG PERFORMANCE STATUS: 1 - Symptomatic but completely ambulatory  BP 132/63  P 100  RR 18  T 98.1 O2 sat 100%    Physical Exam Constitutional: Well-developed, well-nourished, and in no distress, somnolent.   HENT:  Head: Normocephalic and atraumatic.  Mouth/Throat: No oropharyngeal exudate. Mucosa moist. Eyes: Pupils are equal, round, and reactive to light. Conjunctivae are normal. No scleral icterus.  Neck: Normal range of motion. Neck supple. No JVD present.  Cardiovascular: Normal rate, regular rhythm and normal heart sounds.  Exam reveals no gallop and no friction rub.   No murmur heard. Pulmonary/Chest: Effort normal and breath sounds normal. No respiratory distress. No wheezes.No rales.  Abdominal: Soft. Obese. Bowel sounds are normal. No distension. There is no tenderness. There is no guarding.  Musculoskeletal: No edema or tenderness.  Lymphadenopathy:    No cervical or supraclavicular adenopathy.  Neurological: Alert and oriented to person, place, and time. No cranial nerve deficit.  Skin: Skin is warm and dry. No rash noted. No erythema. No pallor. +ecchymosis on hands and forearms. Psychiatric: Affect and judgment normal.   LABORATORY DATA: I have personally reviewed the data as listed:  Hospital Outpatient Visit on 08/15/2017  Component Date Value Ref Range Status  . Glucose-Capillary 08/15/2017 130* 65 - 99 mg/dL Final  . Comment 1 08/15/2017 Notify RN   Final  Hospital Outpatient Visit on 08/15/2017  Component Date Value Ref Range Status  . Sodium 08/15/2017 136  135 - 145 mmol/L Final  . Potassium 08/15/2017 5.5* 3.5 - 5.1 mmol/L Final  . Chloride 08/15/2017 107  101 - 111 mmol/L Final  . CO2 08/15/2017 17* 22 - 32 mmol/L Final  . Glucose, Bld 08/15/2017 147* 65 - 99 mg/dL Final  . BUN  08/15/2017 51* 6 - 20 mg/dL Final  . Creatinine, Ser 08/15/2017 5.27* 0.61 - 1.24 mg/dL Final  . Calcium 08/15/2017 9.4  8.9 - 10.3 mg/dL Final  . GFR calc non Af Amer 08/15/2017 10* >60 mL/min Final  . GFR calc Af Amer 08/15/2017 12* >60 mL/min Final   Comment: (NOTE) The eGFR has been calculated using the CKD EPI equation. This calculation has not been validated in all clinical situations. eGFR's persistently <60 mL/min signify possible Chronic Kidney Disease.   . Anion gap 08/15/2017 12  5 - 15 Final  . WBC 08/15/2017 9.4  4.0 - 10.5 K/uL Final  . RBC 08/15/2017 2.58* 4.22 - 5.81 MIL/uL Final  . Hemoglobin 08/15/2017 7.8* 13.0 - 17.0 g/dL Final  . HCT 08/15/2017 23.2* 39.0 - 52.0 % Final  . MCV 08/15/2017 89.9  78.0 - 100.0 fL Final  . MCH 08/15/2017 30.2  26.0 - 34.0 pg Final  . MCHC 08/15/2017 33.6  30.0 - 36.0 g/dL Final  . RDW 08/15/2017 16.1* 11.5 - 15.5 % Final  . Platelets 08/15/2017 55* 150 - 400 K/uL Final   Comment: REPEATED TO VERIFY SPECIMEN CHECKED FOR CLOTS PLATELET COUNT CONFIRMED  BY SMEAR   . Neutrophils Relative % 08/15/2017 60  % Final  . Lymphocytes Relative 08/15/2017 28  % Final  . Monocytes Relative 08/15/2017 10  % Final  . Eosinophils Relative 08/15/2017 1  % Final  . Basophils Relative 08/15/2017 1  % Final  . nRBC 08/15/2017 2* 0 /100 WBC Final  . Neutro Abs 08/15/2017 5.7  1.7 - 7.7 K/uL Final  . Lymphs Abs 08/15/2017 2.6  0.7 - 4.0 K/uL Final  . Monocytes Absolute 08/15/2017 0.9  0.1 - 1.0 K/uL Final  . Eosinophils Absolute 08/15/2017 0.1  0.0 - 0.7 K/uL Final  . Basophils Absolute 08/15/2017 0.1  0.0 - 0.1 K/uL Final  . RBC Morphology 08/15/2017 RARE NRBCs   Final   POLYCHROMASIA PRESENT  . WBC Morphology 08/15/2017 MODERATE LEFT SHIFT (>5% METAS AND MYELOS,OCC PRO NOTED)   Final  . Prothrombin Time 08/15/2017 13.5  11.4 - 15.2 seconds Final  . INR 08/15/2017 1.04   Final  Hospital Outpatient Visit on 08/13/2017  Component Date Value Ref Range  Status  . Glucose-Capillary 08/13/2017 148* 65 - 99 mg/dL Final  Appointment on 08/02/2017  Component Date Value Ref Range Status  . Sodium 08/02/2017 138  135 - 145 mmol/L Final  . Potassium 08/02/2017 4.1  3.5 - 5.1 mmol/L Final  . Chloride 08/02/2017 108  101 - 111 mmol/L Final  . CO2 08/02/2017 16* 22 - 32 mmol/L Final  . Glucose, Bld 08/02/2017 251* 65 - 99 mg/dL Final  . BUN 08/02/2017 46* 6 - 20 mg/dL Final  . Creatinine, Ser 08/02/2017 5.55* 0.61 - 1.24 mg/dL Final  . Calcium 08/02/2017 8.0* 8.9 - 10.3 mg/dL Final  . Phosphorus 08/02/2017 3.9  2.5 - 4.6 mg/dL Final  . Albumin 08/02/2017 3.7  3.5 - 5.0 g/dL Final  . GFR calc non Af Amer 08/02/2017 10* >60 mL/min Final  . GFR calc Af Amer 08/02/2017 11* >60 mL/min Final   Comment: (NOTE) The eGFR has been calculated using the CKD EPI equation. This calculation has not been validated in all clinical situations. eGFR's persistently <60 mL/min signify possible Chronic Kidney Disease.   . Anion gap 08/02/2017 14  5 - 15 Final  . WBC 08/02/2017 8.8  4.0 - 10.5 K/uL Final  . RBC 08/02/2017 2.76* 4.22 - 5.81 MIL/uL Final  . Hemoglobin 08/02/2017 8.4* 13.0 - 17.0 g/dL Final  . HCT 08/02/2017 25.4* 39.0 - 52.0 % Final  . MCV 08/02/2017 92.0  78.0 - 100.0 fL Final  . MCH 08/02/2017 30.4  26.0 - 34.0 pg Final  . MCHC 08/02/2017 33.1  30.0 - 36.0 g/dL Final  . RDW 08/02/2017 15.5  11.5 - 15.5 % Final  . Platelets 08/02/2017 47* 150 - 400 K/uL Final   Comment: PLATELET COUNT CONFIRMED BY SMEAR SPECIMEN CHECKED FOR CLOTS   . Neutrophils Relative % 08/02/2017 61  % Final  . Lymphocytes Relative 08/02/2017 27  % Final  . Monocytes Relative 08/02/2017 10  % Final  . Eosinophils Relative 08/02/2017 1  % Final  . Basophils Relative 08/02/2017 1  % Final  . Neutro Abs 08/02/2017 5.3  1.7 - 7.7 K/uL Final  . Lymphs Abs 08/02/2017 2.4  0.7 - 4.0 K/uL Final  . Monocytes Absolute 08/02/2017 0.9  0.1 - 1.0 K/uL Final  . Eosinophils Absolute  08/02/2017 0.1  0.0 - 0.7 K/uL Final  . Basophils Absolute 08/02/2017 0.1  0.0 - 0.1 K/uL Final  . WBC Morphology 08/02/2017 MILD  LEFT SHIFT (1-5% METAS, OCC MYELO, OCC BANDS)   Final  Admission on 07/23/2017, Discharged on 07/31/2017  No results displayed because visit has over 200 results.      RADIOGRAPHIC STUDIES: I have personally reviewed the radiological images as listed and agree with the findings in the report  08/13/17: NM PET Image Initial (PI) Whole Body (Accession [id number]) (Order [id number])  Imaging  Date: 08/13/2017 Department: [facility] Released By: Marquis Lunch Authorizing: Twana First, MD  Exam Information   Status Exam Begun  Exam Ended   Final [99] 08/13/2017 12:16 PM 08/13/2017 1:45 PM  PACS Images   Show images for NM PET Image Initial (PI) Whole Body  Study Result   CLINICAL DATA:  Initial treatment strategy for staging of multiple myeloma.  EXAM: NUCLEAR MEDICINE PET WHOLE BODY  TECHNIQUE: 10.7 mCi F-18 FDG was injected intravenously. Full-ring PET imaging was performed from the vertex to the feet after the radiotracer. CT data was obtained and used for attenuation correction and anatomic localization.  FASTING BLOOD GLUCOSE:  Value: 148 mg/dl  COMPARISON:  Skeletal survey of 08/02/2017. Abdominopelvic CT 07/23/2017. Chest CT 01/25/2009.  FINDINGS: HEAD/NECK  No areas of abnormal hypermetabolism.  No cervical adenopathy.  CHEST  No pulmonary parenchymal or thoracic nodal hypermetabolism identified. Mild bilateral gynecomastia. Mild cardiomegaly with multivessel coronary artery atherosclerosis. No thoracic adenopathy. Bibasilar scarring.  ABDOMEN/PELVIS  No abdominopelvic nodal or parenchymal hypermetabolism. Right nephrectomy. Normal adrenal glands. Abdominal aortic atherosclerosis. Caudate enlargement and medial segment left liver lobe atrophy. Cholecystectomy. Lower pole left  renal dominant 9.8 cm low-density lesion is most consistent with a cyst. Fat containing left larger than right inguinal hernias. Mild prostatomegaly.  SKELETON  Relatively diffuse heterogeneous marrow hypermetabolism. Example focus within the sternum measures a S.U.V. max of 6.7. Bilateral femoral shaft foci of hypermetabolism without well-defined CT correlate. Example in the right proximal femoral shaft at a S.U.V. max of 4.2. Heterogeneous marrow density is most apparent in the thoracic spine and bilateral ribs. Cortical irregularity within the tenth and eighth posterolateral left ribs, likely due to a pathologic fractures. Heterogeneous density within the left greater than right scapula with suggestion of cortical destruction on the left on image 76/series 4.  EXTREMITIES  No soft tissue hypermetabolism within the extremities.  IMPRESSION: 1. Extensive marrow hypermetabolism and heterogeneous density, most consistent with relatively diffuse marrow involvement by myeloma. 2. No evidence of hypermetabolic soft tissue disease. 3. Coronary artery atherosclerosis. Aortic Atherosclerosis (ICD10-I70.0). 4. Hepatic morphology for which mild cirrhosis cannot be excluded. Correlate with risk factors.   Electronically Signed   By: Abigail Miyamoto M.D.   On: 08/13/2017 14:20   08/02/17: Skeletal Survey: IMPRESSION: No aggressive lytic or sclerotic osseous lesion involving the axial or appendicular skeleton.    PATHOLOGY:  for JUNIUS, FAUCETT ([id number]) Patient: ORLIN, KANN Collected: 08/15/2017 Client: [facility] Accession: [id number] Received: 08/15/2017 Markus Daft DOB: 1950/12/22 Age: 1 Gender: M Reported: 08/19/2017 [place] Patient Ph: [phone] MRN #: [id number] [place], [place] [id number] Visit #: [id number].[id number] Chart #: Phone: [phone] Fax: CC: Twana First, MD BONE MARROW REPORT FINAL DIAGNOSIS Diagnosis Bone Marrow,  Aspirate,Biopsy, and Clot, right ilium BONE MARROW: - HYPERCELLULAR MARROW (>95%) INVOLVED BY PLASMA CELL MYELOMA - SEE COMMENT PERIPHERAL BLOOD: - NORMOCYTIC ANEMIA - THROMBOCYTOPENIA - MILD ROULEAUX FORMATION - SEE COMPLETE BLOOD COUNT DAWN BUTLER MD Pathologist, Electronic Signature   ASSESSMENT/PLAN 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 Pancytopenia  suspected secondary to underlying MM Hypercalcemia s/p inpatiet pamidronate. ESRD  Plan: -Reviewed bone marrow biopsy, skeletal survey, and PET scan results with the patient and his family in detail today. He has ESRD and will most likely need to start HD soon, patient only has one kidney. Defer to nephrology regarding initiation of HD.  -Critical lab values today with hemoglobin of 5.9 g/dL and platelet count of 25.5k. I have spoken to ED physician about him for blood transfusions, and we will bring patient down. Recommend 3-4 units pRBC transfusion and 1 unit of platelets.  -Discussed starting induction treatment with Revlimid, Velcade, Decadron (RVD) with him. I have reviewed side effects and given him reading material on velcade and revlimid. He will get education again on 09/09/17. Tentatively plan to start his treatment on 08/30/17 as his first dose of velcade. Revlimid will be mailed to him. Discussed asa daily for thromboembolic prophylaxis while on revlimid and patient states he is already on asa 81 mg daily at home. Discussed reactivation of shingles as potential side effect of his treatment and the need for daily prophylactic acyclovir. Decadron, acyclovir, and antiemetics will be sent to his pharmacy.  -RTC on 09/12/17 for follow up to assess tolerability to treatment.     All questions were answered. The patient knows to call the clinic with any problems, questions or concerns.  This note was electronically signed.    Twana First, MD  08/29/2017 3:04 PM

## 2017-08-29 NOTE — H&P (Signed)
History and Physical    Tom Marshall XBJ:[id number] DOB: 1950/11/01 DOA: 08/29/2017  PCP: Tom Noble, Marshall  Patient coming from:  home  Chief Complaint:  Abnormal labs  HPI: Tom Marshall is a 66 y.o. male with medical history significant of multiple myeloma was seeing oncology today and noted to have low hgb and plt count and therefore sent to the ED to be transfused.  Pt says he has been tired lately.  No bleeding.  No sob or cp.  He has not started treatment yet for his MM starts nov 12.   Review of Systems: As per HPI otherwise 10 point review of systems negative.   Past Medical History:  Diagnosis Date  . Cancer of right kidney ([place])   . Cervical dystonia   . Diabetes mellitus without complication [facility])     Past Surgical History:  Procedure Laterality Date  . CHOLECYSTECTOMY  2007  . NEPHRECTOMY Right 1998   cancer     reports that he has never smoked. His smokeless tobacco use includes Chew. He reports that he does not drink alcohol or use drugs.  No Known Allergies  Family History  Problem Relation Age of Onset  . Heart failure Mother 73  . Dementia Father     Prior to Admission medications   Medication Sig Start Date End Date Taking? Authorizing Provider  acyclovir (ZOVIRAX) 400 MG tablet Take 1 tablet (400 mg total) by mouth 2 (two) times daily. 08/29/17  Yes Tom Marshall  allopurinol (ZYLOPRIM) 300 MG tablet Take 300 mg by mouth every morning.   Yes Provider, Historical, Marshall  cyclobenzaprine (FLEXERIL) 10 MG tablet Take 10 mg by mouth at bedtime.   Yes Provider, Historical, Marshall  dexamethasone (DECADRON) 4 MG tablet Take 10 tablets (40 mg) on days 1, 8, and 15 of chemo. Repeat every 21 days. 08/29/17  Yes Tom Marshall  glipiZIDE (GLIPIZIDE XL) 2.5 MG 24 hr tablet Take 1 tablet (2.5 mg total) by mouth daily with breakfast. 07/31/17  Yes Marshall, Tom Artist, Marshall  Ibuprofen-Diphenhydramine Cit (ADVIL PM PO) Take by mouth.   Yes Provider, Historical, Marshall    lenalidomide (REVLIMID) 25 MG capsule Take 1 capsule (25 mg total) by mouth daily. Take 1 capsule on days 1-14. Repeat every 21 days. 08/29/17  Yes Tom Marshall  Multiple Vitamins-Minerals (CENTRUM SILVER 50+MEN) TABS Take 1 tablet by mouth every morning.   Yes Provider, Historical, Marshall  ondansetron (ZOFRAN) 8 MG tablet Take 1 tablet (8 mg total) by mouth 2 (two) times daily as needed (Nausea or vomiting). 08/29/17  Yes Tom Marshall  pravastatin (PRAVACHOL) 20 MG tablet Take 20 mg by mouth every morning.   Yes Provider, Historical, Marshall  prochlorperazine (COMPAZINE) 10 MG tablet Take 1 tablet (10 mg total) by mouth every 6 (six) hours as needed (Nausea or vomiting). 08/29/17  Yes Tom Marshall  sodium bicarbonate 650 MG tablet Take 1 tablet (650 mg total) by mouth 2 (two) times daily. Patient taking differently: Take 650 mg by mouth 3 (three) times daily.  07/31/17  Yes Tom Dike, Marshall    Physical Exam: Vitals:   08/29/17 1609 08/29/17 1613  BP:  132/66  Pulse:  86  Resp:  16  Temp: 98 F (36.7 C)   TempSrc: Oral   SpO2:  98%  Weight: 95.7 kg (211 lb)   Height: '5\' 9"'  (1.753 m)     Constitutional: NAD, calm, comfortable Vitals:   08/29/17 1609 08/29/17  1613  BP:  132/66  Pulse:  86  Resp:  16  Temp: 98 F (36.7 C)   TempSrc: Oral   SpO2:  98%  Weight: 95.7 kg (211 lb)   Height: '5\' 9"'  (1.753 m)    Eyes: PERRL, lids and conjunctivae normal ENMT: Mucous membranes are moist. Posterior pharynx clear of any exudate or lesions.Normal dentition.  Neck: normal, supple, no masses, no thyromegaly Respiratory: clear to auscultation bilaterally, no wheezing, no crackles. Normal respiratory effort. No accessory muscle use.  Cardiovascular: Regular rate and rhythm, no murmurs / rubs / gallops. No extremity edema. 2+ pedal pulses. No carotid bruits.  Abdomen: no tenderness, no masses palpated. No hepatosplenomegaly. Bowel sounds positive.  Musculoskeletal: no clubbing / cyanosis.  No joint deformity upper and lower extremities. Good ROM, no contractures. Normal muscle tone.  Skin: no rashes, lesions, ulcers. No induration Neurologic: CN 2-12 grossly intact. Sensation intact, DTR normal. Strength 5/5 in all 4.  Psychiatric: Normal judgment and insight. Alert and oriented x 3. Normal mood.    Labs on Admission: I have personally reviewed following labs and imaging studies  CBC:  Recent Labs Lab 08/29/17 1450  WBC 6.7  NEUTROABS 3.4  HGB 5.9*  HCT 17.6*  MCV 91.2  PLT 25*   Basic Metabolic Panel:  Recent Labs Lab 08/29/17 1450  NA 135  K 4.8  CL 104  CO2 19*  GLUCOSE 189*  BUN 47*  CREATININE 5.52*  CALCIUM 8.9   GFR: Estimated Creatinine Clearance: 14.8 mL/min (A) (by C-G formula based on SCr of 5.52 mg/dL (H)). Liver Function Tests:  Recent Labs Lab 08/29/17 1450  AST 17  ALT 19  ALKPHOS 90  BILITOT 0.8  PROT 7.0  ALBUMIN 3.6   Cardiac Enzymes:  Recent Labs Lab 08/29/17 1620  TROPONINI <0.03    Radiological Exams on Admission: No results found.  Old chart reviewed Case discussed with Tom Marshall  Assessment/Plan 66 yo male with multiple myeloma with anemia and thrombocytopenia  Principal Problem:   Anemia- transfuse 2 units prbc.  Due to his MM  Active Problems:   Thrombocytopenia ([place])- no active bleeding.  Transfuse one unit platelets   Diabetes mellitus type 2 in obese ([place])- cont home meds   Multiple myeloma ([place])- noted   CKD (chronic kidney disease)- stable.  No need for dialysis at this time.     DVT prophylaxis:  scds  Code Status:  full Family Communication:  none  Disposition Plan:  Per day team Consults called:  none Admission status:  observation   Tom Marshall A Marshall [facility]  If 7PM-7AM, please contact night-coverage [email or web address] Password [facility]  08/29/2017, 5:32 PM

## 2017-08-29 NOTE — Patient Instructions (Signed)
[place] at [facility] Discharge Instructions  RECOMMENDATIONS MADE BY THE CONSULTANT AND ANY TEST RESULTS WILL BE SENT TO YOUR REFERRING PHYSICIAN.  You were seen today by Dr. Twana First Starting Revlimid, Dexamethasone, Velcade soon Probably starting Velcade injection on 11/12 Follow up on 11/15   Thank you for choosing [place] at [facility] to provide your oncology and hematology care.  To afford each patient quality time with our provider, please arrive at least 15 minutes before your scheduled appointment time.    If you have a lab appointment with the [place] please come in thru the  Main Entrance and check in at the main information desk  You need to re-schedule your appointment should you arrive 10 or more minutes late.  We strive to give you quality time with our providers, and arriving late affects you and other patients whose appointments are after yours.  Also, if you no show three or more times for appointments you may be dismissed from the clinic at the providers discretion.     Again, thank you for choosing [facility].  Our hope is that these requests will decrease the amount of time that you wait before being seen by our physicians.       _____________________________________________________________  Should you have questions after your visit to [facility], please contact our office at [phone] between the hours of 8:30 a.m. and 4:30 p.m.  Voicemails left after 4:30 p.m. will not be returned until the following business day.  For prescription refill requests, have your pharmacy contact our office.       Resources For Cancer Patients and their Caregivers ? American Cancer Society: Can assist with transportation, wigs, general needs, runs Look Good Feel Better.        706-705-3491 ? Cancer Care: Provides financial assistance, online support groups, medication/co-pay assistance.   1-800-813-HOPE (769)506-3545) ? [place] Assists [place] cancer patients and their families through emotional , educational and financial support.  (204)835-7390 ? [place] DSS Where to apply for food stamps, Medicaid and utility assistance. (540)235-6397 ? [facility]: Transportation to medical appointments. (629) 490-8479 ? Social Security Administration: May apply for disability if have a Stage IV cancer. 939-164-8239 315 677 8612 ? [employer], Disability and Transit Services: Assists with nutrition, care and transit needs. [place] Support Programs: @10RELATIVEDAYS @ > Cancer Support Group  2nd Tuesday of the month 1pm-2pm, Journey Room  > Creative Journey  3rd Tuesday of the month 1130am-1pm, Journey Room  > Look Good Feel Better  1st Wednesday of the month 10am-12 noon, Journey Room (Call [place] to register [phone])

## 2017-08-29 NOTE — ED Provider Notes (Signed)
[facility] EMERGENCY DEPARTMENT Provider Note   CSN: [id number] Arrival date & time: 08/29/17  1611     History   Chief Complaint Chief Complaint  Patient presents with  . Abnormal Lab    HPI Tom Marshall is a 66 y.o. male.  HPI  Pt was seen at 1620. Per pt and his family, c/o gradual onset and persistence of constant generalized weakness/fatigue for the past several days. Pt states he was evaluated by his Oncologist today and told his hemoglobin and platelet counts were low, and he needed to come to the ED for admission. Denies SOB/cough, no CP/palpitations, no abd pain, no N/V/D, no fevers, no bleeding.   Past Medical History:  Diagnosis Date  . Cancer of right kidney ([place])   . Cervical dystonia   . Diabetes mellitus without complication [facility])     Patient Active Problem List   Diagnosis Date Noted  . Multiple myeloma ([place]) 08/29/2017  . 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 AKI (acute kidney injury) ([place]) 07/24/2017  . Closed fracture of transverse process of lumbar vertebra, with delayed healing, subsequent encounter 07/24/2017  . Renal failure 07/23/2017  . Hypercalcemia 07/23/2017  . Anemia 07/23/2017  . Thrombocytopenia ([place]) 07/23/2017  . Diabetes mellitus type 2 in obese ([place]) 07/23/2017    Past Surgical History:  Procedure Laterality Date  . CHOLECYSTECTOMY  2007  . NEPHRECTOMY Right 1998   cancer       Home Medications    Prior to Admission medications   Medication Sig Start Date End Date Taking? Authorizing Provider  acyclovir (ZOVIRAX) 400 MG tablet Take 1 tablet (400 mg total) by mouth 2 (two) times daily. 08/29/17   Twana First, MD  allopurinol (ZYLOPRIM) 300 MG tablet Take 300 mg by mouth every morning.    [provider]  cyclobenzaprine (FLEXERIL) 10 MG tablet Take 10 mg by mouth at bedtime.    [provider]  dexamethasone (DECADRON) 4 MG tablet Take 10 tablets (40 mg) on days 1, 8, and 15 of chemo. Repeat every 21  days. 08/29/17   Twana First, MD  furosemide (LASIX) 40 MG tablet Take 40 mg by mouth 3 (three) times a week.    [provider]  glipiZIDE (GLIPIZIDE XL) 2.5 MG 24 hr tablet Take 1 tablet (2.5 mg total) by mouth daily with breakfast. 07/31/17   Kathie Dike, MD  Ibuprofen-Diphenhydramine Cit (ADVIL PM PO) Take by mouth.    [provider]  lenalidomide (REVLIMID) 25 MG capsule Take 1 capsule (25 mg total) by mouth daily. Take 1 capsule on days 1-14. Repeat every 21 days. 08/29/17   Twana First, MD  Multiple Vitamins-Minerals (CENTRUM SILVER 50+MEN) TABS Take 1 tablet by mouth every morning.    [provider]  ondansetron (ZOFRAN) 8 MG tablet Take 1 tablet (8 mg total) by mouth 2 (two) times daily as needed (Nausea or vomiting). 08/29/17   Twana First, MD  pravastatin (PRAVACHOL) 20 MG tablet Take 20 mg by mouth every morning.    [provider]  prochlorperazine (COMPAZINE) 10 MG tablet Take 1 tablet (10 mg total) by mouth every 6 (six) hours as needed (Nausea or vomiting). 08/29/17   Twana First, MD  sodium bicarbonate 650 MG tablet Take 1 tablet (650 mg total) by mouth 2 (two) times daily. Patient taking differently: Take 650 mg by mouth 3 (three) times daily.  07/31/17   Kathie Dike, MD    Family History Family History  Problem Relation Age  of Onset  . Heart failure Mother 70  . Dementia Father     Social History Social History  Substance Use Topics  . Smoking status: Never Smoker  . Smokeless tobacco: Current User    Types: Chew  . Alcohol use No     Allergies   Patient has no known allergies.   Review of Systems Review of Systems ROS: Statement: All systems negative except as marked or noted in the HPI; Constitutional: Negative for fever and chills. +generalized weakness/fatigue.; ; Eyes: Negative for eye pain, redness and discharge. ; ; ENMT: Negative for ear pain, hoarseness, nasal congestion, sinus pressure and sore throat. ; ;  Cardiovascular: Negative for chest pain, palpitations, diaphoresis, dyspnea and peripheral edema. ; ; Respiratory: Negative for cough, wheezing and stridor. ; ; Gastrointestinal: Negative for nausea, vomiting, diarrhea, abdominal pain, blood in stool, hematemesis, jaundice and rectal bleeding. . ; ; Genitourinary: Negative for dysuria, flank pain and hematuria. ; ; Musculoskeletal: Negative for back pain and neck pain. Negative for swelling and trauma.; ; Skin: Negative for pruritus, rash, abrasions, blisters, bruising and skin lesion.; ; Neuro: Negative for headache, lightheadedness and neck stiffness. Negative for altered level of consciousness, altered mental status, extremity weakness, paresthesias, involuntary movement, seizure and syncope.       Physical Exam Updated Vital Signs BP 132/66   Pulse 86   Temp 98 F (36.7 C) (Oral)   Resp 16   Ht '5\' 9"'  (1.753 m)   Wt 95.7 kg (211 lb)   SpO2 98%   BMI 31.16 kg/m   Physical Exam 1625: Physical examination:  Nursing notes reviewed; Vital signs and O2 SAT reviewed;  Constitutional: Well developed, Well nourished, Well hydrated, In no acute distress; Head:  Normocephalic, atraumatic; Eyes: EOMI, PERRL, No scleral icterus. Conjunctiva pale.; ENMT: Mouth and pharynx normal, Mucous membranes moist; Neck: Supple, Full range of motion, No lymphadenopathy; Cardiovascular: Regular rate and rhythm, No gallop; Respiratory: Breath sounds clear & equal bilaterally, No wheezes.  Speaking full sentences with ease, Normal respiratory effort/excursion; Chest: Nontender, Movement normal; Abdomen: Soft, Nontender, Nondistended, Normal bowel sounds; Genitourinary: No CVA tenderness; Extremities: Pulses normal, No tenderness, No edema, No calf edema or asymmetry.; Neuro: AA&Ox3, Major CN grossly intact.  Speech clear. No gross focal motor or sensory deficits in extremities.; Skin: Color pale, Warm, Dry.   ED Treatments / Results  Labs (all labs ordered are  listed, but only abnormal results are displayed)   EKG  EKG Interpretation  Date/Time:  Thursday August 29 2017 16:13:08 EDT Ventricular Rate:  87 PR Interval:    QRS Duration: 95 QT Interval:  381 QTC Calculation: 459 R Axis:   67 Text Interpretation:  Sinus rhythm Nonspecific ST and T wave abnormality When compared with ECG of 07/23/2017 Rate slower Confirmed by Francine Graven [phone]) on 08/29/2017 5:40:11 PM       Radiology   Procedures Procedures (including critical care time)  Medications Ordered in ED Medications - No data to display   Initial Impression / Assessment and Plan / ED Course  I have reviewed the triage vital signs and the nursing notes.  Pertinent labs & imaging results that were available during my care of the patient were reviewed by me and considered in my medical decision making (see chart for details).  MDM Reviewed: previous chart, nursing note and vitals Reviewed previous: labs and ECG Interpretation: labs and ECG Total time providing critical care: 30-74 minutes. This excludes time spent performing separately reportable procedures and  services. Consults: admitting MD   CRITICAL CARE Performed by: Alfonzo Feller Total critical care time: 35 minutes Critical care time was exclusive of separately billable procedures and treating other patients. Critical care was necessary to treat or prevent imminent or life-threatening deterioration. Critical care was time spent personally by me on the following activities: development of treatment plan with patient and/or surrogate as well as nursing, discussions with consultants, evaluation of patient's response to treatment, examination of patient, obtaining history from patient or surrogate, ordering and performing treatments and interventions, ordering and review of laboratory studies, ordering and review of radiographic studies, pulse oximetry and re-evaluation of patient's condition.   Results  for orders placed or performed in visit on 08/29/17  CBC with Differential  Result Value Ref Range   WBC 6.7 4.0 - 10.5 K/uL   RBC 1.93 (L) 4.22 - 5.81 MIL/uL   Hemoglobin 5.9 (LL) 13.0 - 17.0 g/dL   HCT 17.6 (L) 39.0 - 52.0 %   MCV 91.2 78.0 - 100.0 fL   MCH 30.6 26.0 - 34.0 pg   MCHC 33.5 30.0 - 36.0 g/dL   RDW 16.9 (H) 11.5 - 15.5 %   Platelets 25 (LL) 150 - 400 K/uL   Neutrophils Relative % 50 %   Neutro Abs 3.4 1.7 - 7.7 K/uL   Lymphocytes Relative 35 %   Lymphs Abs 2.3 0.7 - 4.0 K/uL   Monocytes Relative 12 %   Monocytes Absolute 0.8 0.1 - 1.0 K/uL   Eosinophils Relative 2 %   Eosinophils Absolute 0.1 0.0 - 0.7 K/uL   Basophils Relative 1 %   Basophils Absolute 0.1 0.0 - 0.1 K/uL  Comprehensive metabolic panel  Result Value Ref Range   Sodium 135 135 - 145 mmol/L   Potassium 4.8 3.5 - 5.1 mmol/L   Chloride 104 101 - 111 mmol/L   CO2 19 (L) 22 - 32 mmol/L   Glucose, Bld 189 (H) 65 - 99 mg/dL   BUN 47 (H) 6 - 20 mg/dL   Creatinine, Ser 5.52 (H) 0.61 - 1.24 mg/dL   Calcium 8.9 8.9 - 10.3 mg/dL   Total Protein 7.0 6.5 - 8.1 g/dL   Albumin 3.6 3.5 - 5.0 g/dL   AST 17 15 - 41 U/L   ALT 19 17 - 63 U/L   Alkaline Phosphatase 90 38 - 126 U/L   Total Bilirubin 0.8 0.3 - 1.2 mg/dL   GFR calc non Af Amer 10 (L) >60 mL/min   GFR calc Af Amer 11 (L) >60 mL/min   Anion gap 12 5 - 15   Results for orders placed or performed during the hospital encounter of 08/29/17  Troponin I  Result Value Ref Range   Troponin I <0.03 <0.03 ng/mL  Type and screen [facility]  Result Value Ref Range   ABO/RH(D) A POS    Antibody Screen PENDING    Sample Expiration 09/01/2017   Prepare RBC  Result Value Ref Range   Order Confirmation ORDER PROCESSED BY BLOOD BANK      1725:  PRBC's ordered and will start transfusion. Dx and testing d/w pt and family.  Questions answered.  Verb understanding, agreeable to admit.   T/C to [facility]. Shanon Brow, case discussed, including:  HPI, pertinent  PM/SHx, VS/PE, dx testing, ED course and treatment:  Agreeable to admit.    Final Clinical Impressions(s) / ED Diagnoses   Final diagnoses:  None    New Prescriptions New Prescriptions   No  medications on file     Francine Graven, DO 09/01/17 1355

## 2017-08-29 NOTE — ED Triage Notes (Signed)
Pt comes in from the cancer center for hemoglobin of 5.9. Pt is currently getting treatment for multiple myeloma. Pt is alert and oriented. VS stable.

## 2017-08-30 ENCOUNTER — Ambulatory Visit (HOSPITAL_COMMUNITY): Payer: Medicare Other

## 2017-08-30 ENCOUNTER — Other Ambulatory Visit (HOSPITAL_COMMUNITY): Payer: Self-pay | Admitting: *Deleted

## 2017-08-30 DIAGNOSIS — D696 Thrombocytopenia, unspecified: Secondary | ICD-10-CM | POA: Diagnosis not present

## 2017-08-30 DIAGNOSIS — D649 Anemia, unspecified: Secondary | ICD-10-CM | POA: Diagnosis not present

## 2017-08-30 DIAGNOSIS — C9000 Multiple myeloma not having achieved remission: Secondary | ICD-10-CM | POA: Diagnosis not present

## 2017-08-30 DIAGNOSIS — E669 Obesity, unspecified: Secondary | ICD-10-CM | POA: Diagnosis not present

## 2017-08-30 DIAGNOSIS — N189 Chronic kidney disease, unspecified: Secondary | ICD-10-CM | POA: Diagnosis not present

## 2017-08-30 DIAGNOSIS — E1169 Type 2 diabetes mellitus with other specified complication: Secondary | ICD-10-CM | POA: Diagnosis not present

## 2017-08-30 LAB — GLUCOSE, CAPILLARY
Glucose-Capillary: 151 mg/dL — ABNORMAL HIGH (ref 65–99)
Glucose-Capillary: 142 mg/dL — ABNORMAL HIGH (ref 65–99)

## 2017-08-30 LAB — CBC
HCT: 23.7 % — ABNORMAL LOW (ref 39.0–52.0)
Hemoglobin: 8.2 g/dL — ABNORMAL LOW (ref 13.0–17.0)
MCH: 30.4 pg (ref 26.0–34.0)
MCHC: 34.6 g/dL (ref 30.0–36.0)
MCV: 87.8 fL (ref 78.0–100.0)
Platelets: 51 10*3/uL — ABNORMAL LOW (ref 150–400)
RBC: 2.7 MIL/uL — ABNORMAL LOW (ref 4.22–5.81)
RDW: 16.1 % — AB (ref 11.5–15.5)
WBC: 8.3 10*3/uL (ref 4.0–10.5)

## 2017-08-30 LAB — BASIC METABOLIC PANEL
Anion gap: 12 (ref 5–15)
BUN: 45 mg/dL — AB (ref 6–20)
CALCIUM: 9 mg/dL (ref 8.9–10.3)
CO2: 19 mmol/L — ABNORMAL LOW (ref 22–32)
CREATININE: 5.45 mg/dL — AB (ref 0.61–1.24)
Chloride: 107 mmol/L (ref 101–111)
GFR calc Af Amer: 11 mL/min — ABNORMAL LOW (ref >60)
GFR, EST NON AFRICAN AMERICAN: 10 mL/min — AB (ref >60)
GLUCOSE: 103 mg/dL — AB (ref 65–99)
Potassium: 5.2 mmol/L — ABNORMAL HIGH (ref 3.5–5.1)
Sodium: 138 mmol/L (ref 135–145)

## 2017-08-30 LAB — POTASSIUM: Potassium: 4.7 mmol/L (ref 3.5–5.1)

## 2017-08-30 MED ORDER — LENALIDOMIDE 25 MG PO CAPS: 25.0000 mg | ORAL_CAPSULE | Freq: Every day | ORAL | 3 refills | Status: DC

## 2017-08-30 MED ORDER — ACYCLOVIR 200 MG PO CAPS
400.0000 mg | ORAL_CAPSULE | Freq: Two times a day (BID) | ORAL | Status: DC
  Administered 2017-08-30: 400 mg via ORAL
  Filled 2017-08-30: qty 2

## 2017-08-30 NOTE — Care Management Obs Status (Signed)
[place] NOTIFICATION   Patient Details  Name: Tom Marshall MRN: [id number] Date of Birth: 01/06/1951   Medicare Observation Status Notification Given:  Other (see comment) (pt discharged < 24hrs)    Sherald Barge, RN 08/30/2017, 12:04 PM

## 2017-08-30 NOTE — Progress Notes (Signed)
Patient IV removed, tolerated well. Patient given discharge instructions at bedside.  

## 2017-08-30 NOTE — Discharge Summary (Signed)
Physician Discharge Summary  Tom Marshall BSJ:[id number] DOB: [birth year] DOA: 08/29/2017  PCP: Asencion Noble, MD  Admit date: 08/29/2017 Discharge date: 08/30/2017  Admitted From: Home Disposition:  Home   Recommendations for Outpatient Follow-up:  1. Follow up with PCP in 1-2 weeks 2. Follow up with Dr. Talbert Cage at previously scheduled appointment 3. Please obtain BMP/CBC in one week 4. Follow up with nephrologist   Home Health: No Equipment/Devices: None  Discharge Condition: Stable  CODE STATUS: DNR   Diet recommendation: Regular  Brief/Interim Summary: Tom Marshall is a 66 y.o. male with medical history significant of multiple myeloma was seeing oncology today and noted to have low hgb and plt count and therefore sent to the ED to be transfused.  Pt says he has been tired lately.  No bleeding.  No sob or cp.  He has not started treatment yet for his MM starts nov 12.  Patient was admitted and transfused 3units PRBC and 1 unit platelets.  He was stable for discharge on 11/2 and was encouraged to follow up with his PCP, Dr. Talbert Cage his oncologist and his nephrologist.  His potassium was elevated but resolved to be within normal limits by time of discharge.  Discharge Diagnoses:  Principal Problem:   Anemia Active Problems:   Thrombocytopenia ([place])   Diabetes mellitus type 2 in obese ([place])   Multiple myeloma ([facility])   CKD (chronic kidney disease)    Discharge Instructions  Discharge Instructions    Call MD for:  difficulty breathing, headache or visual disturbances    Complete by:  As directed    Call MD for:  extreme fatigue    Complete by:  As directed    Call MD for:  hives    Complete by:  As directed    Call MD for:  persistant dizziness or light-headedness    Complete by:  As directed    Call MD for:  persistant nausea and vomiting    Complete by:  As directed    Call MD for:  redness, tenderness, or signs of infection (pain, swelling, redness, odor or green/yellow  discharge around incision site)    Complete by:  As directed    Call MD for:  severe uncontrolled pain    Complete by:  As directed    Call MD for:  temperature >100.4    Complete by:  As directed    Diet general    Complete by:  As directed    Increase activity slowly    Complete by:  As directed      Allergies as of 08/30/2017   No Known Allergies     Medication List    TAKE these medications   acyclovir 400 MG tablet Commonly known as:  ZOVIRAX Take 1 tablet (400 mg total) by mouth 2 (two) times daily.   ADVIL PM PO Take by mouth.   allopurinol 300 MG tablet Commonly known as:  ZYLOPRIM Take 300 mg by mouth every morning.   CENTRUM SILVER 50+MEN Tabs Take 1 tablet by mouth every morning.   cyclobenzaprine 10 MG tablet Commonly known as:  FLEXERIL Take 10 mg by mouth at bedtime.   dexamethasone 4 MG tablet Commonly known as:  DECADRON Take 10 tablets (40 mg) on days 1, 8, and 15 of chemo. Repeat every 21 days.   glipiZIDE 2.5 MG 24 hr tablet Commonly known as:  GLIPIZIDE XL Take 1 tablet (2.5 mg total) by mouth daily with breakfast.   lenalidomide  25 MG capsule Commonly known as:  REVLIMID Take 1 capsule (25 mg total) by mouth daily. Take 1 capsule on days 1-14. Repeat every 21 days.   ondansetron 8 MG tablet Commonly known as:  ZOFRAN Take 1 tablet (8 mg total) by mouth 2 (two) times daily as needed (Nausea or vomiting).   pravastatin 20 MG tablet Commonly known as:  PRAVACHOL Take 20 mg by mouth every morning.   prochlorperazine 10 MG tablet Commonly known as:  COMPAZINE Take 1 tablet (10 mg total) by mouth every 6 (six) hours as needed (Nausea or vomiting).   sodium bicarbonate 650 MG tablet Take 1 tablet (650 mg total) by mouth 2 (two) times daily. What changed:  when to take this      Follow-up Information    Asencion Noble, MD. Schedule an appointment as soon as possible for a visit in 1 week(s).   Specialty:  Internal Medicine Contact  information: [address] [place] [address] [phone]        Twana First, MD. Schedule an appointment as soon as possible for a visit in 1 week(s).   Specialty:  Oncology Contact information: [place] [address] [phone]        Fran Lowes, MD. Schedule an appointment as soon as possible for a visit in 1 week(s).   Specialty:  Nephrology Contact information: [address] [address] [phone]          No Known Allergies  Consultations:  None   Procedures/Studies: Nm Pet Image Initial (pi) Whole Body  Result Date: 08/13/2017 CLINICAL DATA:  Initial treatment strategy for staging of multiple myeloma. EXAM: NUCLEAR MEDICINE PET WHOLE BODY TECHNIQUE: 10.7 mCi F-18 FDG was injected intravenously. Full-ring PET imaging was performed from the vertex to the feet after the radiotracer. CT data was obtained and used for attenuation correction and anatomic localization. FASTING BLOOD GLUCOSE:  Value: 148 mg/dl COMPARISON:  Skeletal survey of 08/02/2017. Abdominopelvic CT 07/23/2017. Chest CT 01/25/2009. FINDINGS: HEAD/NECK No areas of abnormal hypermetabolism.  No cervical adenopathy. CHEST No pulmonary parenchymal or thoracic nodal hypermetabolism identified. Mild bilateral gynecomastia. Mild cardiomegaly with multivessel coronary artery atherosclerosis. No thoracic adenopathy. Bibasilar scarring. ABDOMEN/PELVIS No abdominopelvic nodal or parenchymal hypermetabolism. Right nephrectomy. Normal adrenal glands. Abdominal aortic atherosclerosis. Caudate enlargement and medial segment left liver lobe atrophy. Cholecystectomy. Lower pole left renal dominant 9.8 cm low-density lesion is most consistent with a cyst. Fat containing left larger than right inguinal hernias. Mild prostatomegaly. SKELETON Relatively diffuse heterogeneous marrow hypermetabolism. Example focus within the sternum measures a S.U.V. max of 6.7. Bilateral  femoral shaft foci of hypermetabolism without well-defined CT correlate. Example in the right proximal femoral shaft at a S.U.V. max of 4.2. Heterogeneous marrow density is most apparent in the thoracic spine and bilateral ribs. Cortical irregularity within the tenth and eighth posterolateral left ribs, likely due to a pathologic fractures. Heterogeneous density within the left greater than right scapula with suggestion of cortical destruction on the left on image 76/series 4. EXTREMITIES No soft tissue hypermetabolism within the extremities. IMPRESSION: 1. Extensive marrow hypermetabolism and heterogeneous density, most consistent with relatively diffuse marrow involvement by myeloma. 2. No evidence of hypermetabolic soft tissue disease. 3. Coronary artery atherosclerosis. Aortic Atherosclerosis (ICD10-I70.0). 4. Hepatic morphology for which mild cirrhosis cannot be excluded. Correlate with risk factors. Electronically Signed   By: Abigail Miyamoto M.D.   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 EXAM: CT GUIDED BONE MARROW ASPIRATES AND BIOPSY Physician: Stephan Minister. Anselm Pancoast, MD MEDICATIONS: None. ANESTHESIA/SEDATION: Fentanyl 100 mcg IV; Versed 2.0 mg IV Moderate Sedation Time:  15 minutes The patient was continuously monitored during the procedure by the interventional radiology nurse under my direct supervision. COMPLICATIONS: None immediate. PROCEDURE: The procedure was explained to the patient. The risks and benefits of the procedure were discussed and the patient's questions were addressed. Informed consent was obtained from the patient. The patient was placed prone on CT scan. Images of the pelvis were obtained. The right side of back was prepped and draped in sterile fashion. The skin and right posterior iliac bone were anesthetized with 1% lidocaine. 11 gauge bone needle was directed into the right iliac bone with CT guidance. Two aspirates and  one core biopsy obtained. Bandage placed over the puncture site. IMPRESSION: CT guided bone marrow aspirates and core biopsy. Electronically Signed   By: Markus Daft M.D.   On: 08/15/2017 12:18   Dg Bone Survey Met  Result Date: 08/02/2017 CLINICAL DATA:  Monoclonal gammopathy. EXAM: METASTATIC BONE SURVEY COMPARISON:  None. FINDINGS: Multiple x-rays of the axial and appendicular skeleton were provided. Generalized osteopenia. No lytic or sclerotic osseous lesion. No periosteal reaction or bone destruction. Degenerative disc disease with disc height loss at C3-4, C4-5, C5-6 and C6-7. 3 mm anterolisthesis of C4 on C5 secondary to facet disease. Degenerative disc disease with disc height loss at L3-4 and L5-S1. Bilateral diffuse mild interstitial thickening. No focal consolidation, pleural effusion or pneumothorax. Stable cardiomegaly. Mild right medial femorotibial compartment osteoarthritis. Mild left medial femorotibial compartment osteoarthritis. IMPRESSION: No aggressive lytic or sclerotic osseous lesion involving the axial or appendicular skeleton. Electronically Signed   By: Kathreen Devoid   On: 08/02/2017 15:46   Ct Bone Marrow Biopsy & Aspiration  Result Date: 08/15/2017 INDICATION: 66 year old with monoclonal gammopathy. EXAM: CT GUIDED BONE MARROW ASPIRATES AND BIOPSY Physician: Stephan Minister. Anselm Pancoast, MD MEDICATIONS: None. ANESTHESIA/SEDATION: Fentanyl 100 mcg IV; Versed 2.0 mg IV Moderate Sedation Time:  15 minutes The patient was continuously monitored during the procedure by the interventional radiology nurse under my direct supervision. COMPLICATIONS: None immediate. PROCEDURE: The procedure was explained to the patient. The risks and benefits of the procedure were discussed and the patient's questions were addressed. Informed consent was obtained from the patient. The patient was placed prone on CT scan. Images of the pelvis were obtained. The right side of back was prepped and draped in sterile fashion.  The skin and right posterior iliac bone were anesthetized with 1% lidocaine. 11 gauge bone needle was directed into the right iliac bone with CT guidance. Two aspirates and one core biopsy obtained. Bandage placed over the puncture site. IMPRESSION: CT guided bone marrow aspirates and core biopsy. Electronically Signed   By: Markus Daft M.D.   On: 08/15/2017 12:18     Subjective: Patient in good spirits this am.  He says he feels better.  Has many questions about his cancer and his chemotherapy.  Denies shortness of breath, chest pain and chest pressure.  Discharge Exam: Vitals:   08/30/17 0339 08/30/17 0500  BP: 137/68 130/63  Pulse: 89 80  Resp: 16 18  Temp: 99.1 F (37.3 C) 98.5 F (36.9 C)  SpO2: 97% 99%   Vitals:   08/30/17 0113 08/30/17 0304 08/30/17 0339 08/30/17 0500  BP: 120/61 138/62 137/68 130/63  Pulse: 83 83 89 80  Resp: '16 16 16 18  ' Temp: 99  F (37.2 C) 98.7 F (37.1 C) 99.1 F (37.3 C) 98.5 F (36.9 C)  TempSrc: Oral Oral Oral Oral  SpO2: 100% 100% 97% 99%  Weight:      Height:        General: Pt is alert, awake, not in acute distress Cardiovascular: RRR, S1/S2 +, no rubs, no gallops Respiratory: CTA bilaterally, no wheezing, no rhonchi Abdominal: Soft, NT, ND, bowel sounds + Extremities: no edema, no cyanosis    The results of significant diagnostics from this hospitalization (including imaging, microbiology, ancillary and laboratory) are listed below for reference.     Microbiology: No results found for this or any previous visit (from the past 240 hour(s)).   Labs: BNP (last 3 results)  Recent Labs  07/23/17 1406  BNP 88.8   Basic Metabolic Panel:  Recent Labs Lab 08/29/17 1450 08/30/17 0635 08/30/17 1259  NA 135 138  --   K 4.8 5.2* 4.7  CL 104 107  --   CO2 19* 19*  --   GLUCOSE 189* 103*  --   BUN 47* 45*  --   CREATININE 5.52* 5.45*  --   CALCIUM 8.9 9.0  --    Liver Function Tests:  Recent Labs Lab 08/29/17 1450  AST  17  ALT 19  ALKPHOS 90  BILITOT 0.8  PROT 7.0  ALBUMIN 3.6   No results for input(s): LIPASE, AMYLASE in the last 168 hours. No results for input(s): AMMONIA in the last 168 hours. CBC:  Recent Labs Lab 08/29/17 1450 08/30/17 0635  WBC 6.7 8.3  NEUTROABS 3.4  --   HGB 5.9* 8.2*  HCT 17.6* 23.7*  MCV 91.2 87.8  PLT 25* 51*   Cardiac Enzymes:  Recent Labs Lab 08/29/17 1620  TROPONINI <0.03   BNP: Invalid input(s): POCBNP CBG:  Recent Labs Lab 08/29/17 2014 08/30/17 0836 08/30/17 1104  GLUCAP 160* 151* 142*   D-Dimer No results for input(s): DDIMER in the last 72 hours. Hgb A1c No results for input(s): HGBA1C in the last 72 hours. Lipid Profile No results for input(s): CHOL, HDL, LDLCALC, TRIG, CHOLHDL, LDLDIRECT in the last 72 hours. Thyroid function studies No results for input(s): TSH, T4TOTAL, T3FREE, THYROIDAB in the last 72 hours.  Invalid input(s): FREET3 Anemia work up No results for input(s): VITAMINB12, FOLATE, FERRITIN, TIBC, IRON, RETICCTPCT in the last 72 hours. Urinalysis    Component Value Date/Time   COLORURINE STRAW (A) 07/23/2017 2251   APPEARANCEUR CLEAR 07/23/2017 2251   LABSPEC 1.014 07/23/2017 2251   PHURINE 6.0 07/23/2017 2251   GLUCOSEU >=500 (A) 07/23/2017 2251   HGBUR NEGATIVE 07/23/2017 2251   BILIRUBINUR NEGATIVE 07/23/2017 2251   KETONESUR NEGATIVE 07/23/2017 2251   PROTEINUR 100 (A) 07/23/2017 2251   NITRITE NEGATIVE 07/23/2017 2251   LEUKOCYTESUR NEGATIVE 07/23/2017 2251   Sepsis Labs Invalid input(s): PROCALCITONIN,  WBC,  LACTICIDVEN Microbiology No results found for this or any previous visit (from the past 240 hour(s)).   Time coordinating discharge: 25 minutes  SIGNED:   Loretha Stapler, MD  [facility] 08/30/2017, 2:09 PM Pager [phone] If 7PM-7AM, please contact night-coverage [email or web address] Password [email or web address]

## 2017-08-31 LAB — BPAM PLATELET PHERESIS
Blood Product Expiration Date: 201811032359
ISSUE DATE / TIME: 201811020309
Unit Type and Rh: 6200

## 2017-08-31 LAB — PREPARE PLATELET PHERESIS: Unit division: 0

## 2017-09-02 DIAGNOSIS — N179 Acute kidney failure, unspecified: Secondary | ICD-10-CM | POA: Diagnosis not present

## 2017-09-02 DIAGNOSIS — D649 Anemia, unspecified: Secondary | ICD-10-CM | POA: Diagnosis not present

## 2017-09-02 DIAGNOSIS — E1129 Type 2 diabetes mellitus with other diabetic kidney complication: Secondary | ICD-10-CM | POA: Diagnosis not present

## 2017-09-02 DIAGNOSIS — C9000 Multiple myeloma not having achieved remission: Secondary | ICD-10-CM | POA: Diagnosis not present

## 2017-09-02 LAB — TYPE AND SCREEN
ABO/RH(D): A POS
Antibody Screen: NEGATIVE
UNIT DIVISION: 0
UNIT DIVISION: 0
Unit division: 0
Unit division: 0

## 2017-09-02 LAB — BPAM RBC
BLOOD PRODUCT EXPIRATION DATE: 201811102359
BLOOD PRODUCT EXPIRATION DATE: 201811162359
Blood Product Expiration Date: 201811162359
Blood Product Expiration Date: 201811162359
ISSUE DATE / TIME: 201811011750
ISSUE DATE / TIME: 201811012154
ISSUE DATE / TIME: 201811020044
UNIT TYPE AND RH: 6200
UNIT TYPE AND RH: 6200
Unit Type and Rh: 6200
Unit Type and Rh: 6200

## 2017-09-04 ENCOUNTER — Telehealth (HOSPITAL_COMMUNITY): Payer: Self-pay | Admitting: Oncology

## 2017-09-04 NOTE — Patient Instructions (Addendum)
[facility]   CHEMOTHERAPY INSTRUCTIONS  We are going to start treating your multiple myeloma with velcade and dexamethasone and revlimid.   Velcade will be day 1, day 4, day 8, and  day 11 every 21 days.  You will take dexamethasone 10 tablets (40 mg) weekly on Mondays. You will take Revlimid for 14 days in a row with 7 days off.  You will have your labs checked prior to having your injection every week.   You will see the doctor regularly throughout treatment.  We monitor your lab work prior to every treatment.  The doctor monitors your response to treatment by the way you are feeling, your blood work, and scans periodically.   POTENTIAL SIDE EFFECTS OF TREATMENT: Bortezomib (Velcade)  About This Drug Bortezomib is used to treat cancer. It is given in the vein (IV) or by a shot under the skin (subcutaneously)  Possible Side Effects . Bone marrow depression. Decrease in the number of white blood cells, red blood cells, and platelets. This may raise your risk of infection, make you tired and weak (fatigue), and raise your risk of bleeding. . Nausea and vomiting (throwing up) . Decreased appetite (decreased hunger) . Constipation (not able to move bowels) . Loose bowel movements (diarrhea) . Neuropathy. Effects on the nerves are called peripheral neuropathy. You may feel numbness, tingling, or pain in your hands and feet. It may be hard for you to button your clothes, open jars, or walk as usual. The effect on the nerves may get worse with more doses of the drug. These effects get better in some people after the drug is stopped but it does not get better in all people. . Tiredness . Rash . Fever Note: Each of the side effects above was reported in 20% or greater of patients treated with bortezomib. Not all possible side effects are included above.  Warnings and Precautions . Low blood pressure . Congestive heart failure. You may be short of breath. Your  arms, hands, legs and feet may swell. . Inflammation (swelling) of the lungs. You may have a dry cough or trouble breathing. . A partial or complete blockage of your small and/or large intestine. . Changes in your central nervous system can happen. The central nervous system is made up of your brain and spinal cord. You could feel extreme tiredness, agitation, confusion, hallucinations (see or hear things that are not there), trouble understanding or speaking, loss of control of your bowels or bladder, eyesight changes, numbness or lack of strength to your arms, legs, face, or body, and coma. If you start to have any of these symptoms let your doctor know right away. . Tumor lysis: This drug may act on the cancer cells very quickly. This may affect how your kidneys work. . Changes in your liver function  Important Information . This drug may be present in the saliva, tears, sweat, urine, stool, vomit, semen, and vaginal secretions. Talk to your doctor and/or your nurse about the necessary precautions to take during this time. . This drug may impair your ability to drive or use machinery. Use caution and tell your nurse or doctor if you feel dizzy, very sleepy, and/or experience low blood pressure.  Treating Side Effects . Manage tiredness by pacing your activities for the day. . Be sure to include periods of rest between energy-draining activities. Wendee Copp your hands regularly. . Avoid close contact with people who have a cold, the flu, or other  infections. . Use a soft toothbrush. Check with your nurse before using dental floss. . Be very careful when using knives or tools. . Use an electric shaver instead of a razor. . Ask your doctor or nurse about medicines that are available to help stop or lessen constipation. . If you are not able to move your bowels, check with your doctor or nurse before you use enemas, laxatives, or suppositories. . Drink plenty of fluids (a minimum of eight glasses  per day is recommended). . If you throw up or have loose bowel movements, you should drink more fluids so that you do not become dehydrated (lack water in the body from losing too much fluid). . If you get diarrhea, eat low-fiber foods that are high in protein and calories and avoid foods that can irritate your digestive tracts or lead to cramping. . Ask your nurse or doctor about medicine that can lessen or stop your diarrhea. . To help with nausea and vomiting, eat small, frequent meals instead of three large meals a day. Choose foods and drinks that are at room temperature. Ask your nurse or doctor about other helpful tips and medicine that is available to help or stop lessen these symptoms. . To help with decreased appetite, eat small, frequent meals. . Eat high caloric food such as pudding, ice cream, yogurt and milkshakes. . If you have numbness and tingling in your hands and feet, be careful when cooking, walking, and handling sharp objects and hot liquids. . If you get a rash do not put anything on it unless your doctor or nurse says you may. Keep the area around the rash clean and dry. Ask your doctor for medicine if your rash bothers you. . Signs of tumor lysis: Confusion or agitation, decreased urine, nausea/vomiting, diarrhea, muscle cramping, numbness and/or tingling, seizures.  Food and Drug Interactions . There are no known interactions of bortezomib with food. . Check with your doctor or pharmacist about all other prescription medicines and dietary supplements you are taking before starting this medicine as there are a lot of known drug interactions with bortezemib. Also, check with your doctor or pharmacist before starting any new prescription or over-the-counter medicines, or dietary supplement to make sure that there are no interactions. . Avoid the use of St. John's Wort with bortezomib as this may lower the levels of the drug in your body, which can make it less  effective.   When to Call the Doctor Call your doctor or nurse if you have any of these symptoms and/or any new or unusual symptoms: . Fever of 100.5 F (38 C) or higher . Chills . Fatigue that interferes with your daily activities . Feeling dizzy or lightheaded . Feeling that your heart is beating in a fast or not normal way (palpitations) . Trouble breathing . Swelling of legs, ankles, or feet . Weight gain of 5 pounds in one week (fluid retention) . Easy bleeding or bruising . Confusion and/or agitation . Hallucinations . Trouble understanding or speaking . Blurry vision or changes in your eyesight . Numbness or lack of strength to your arms, legs, face, or body . No bowel movement in 3 days or when you feel uncomfortable. . Abdominal pain that does not go away . Loose bowel movements (diarrhea) 4 times a day or loose bowel movements with lack of strength or a feeling of being dizzy . Nausea that stops you from eating or drinking and/or is not relieved by prescribed medicines . Throwing  up more than 3 times a day . Numbness, tingling, or pain your hands and feet . New rash and/or itching . Rash that is not relieved by prescribed medicines . Lasting loss of appetite or rapid weight loss of five pounds in a week . Signs of tumor lysis: Confusion or agitation, decreased urine, nausea/vomiting, diarrhea, muscle cramping, numbness and/or tingling, seizures. . Signs of possible liver problems: dark urine, pale bowel movements, bad stomach pain, feeling very tired and weak, unusual itching, or yellowing of the eyes or skin . If you think you are pregnant or may have impregnated your partner      Reproduction Warnings . Pregnancy warning: This drug can have harmful effects on the unborn baby. Women of child bearing potential should use effective methods of birth control during your cancer treatment and for at least 2 months after treatment. Men with male partners of child bearing  potential should use effective methods of birth control during your cancer treatment and for at least 2 months after your cancer treatment. Let your doctor know right away if you think you may be pregnant or may have impregnated your partner. . Breastfeeding warning: Women should not breast feed during treatment and for at least 2 months month after treatment because this drug could enter the breast milk and cause harm to a breast feeding baby. . Fertility warning: In men and women both, this drug may affect your ability to have children in the future. Talk with your doctor or nurse if you plan to have children. Ask for information on sperm or egg banking.   Lenalidomide (Revlimid)  About This Drug Lenalidomide is used to treat cancer. It is given orally (by mouth).  Possible Side Effects . Bone marrow depression. This is a decrease in the number of white blood cells, red blood cells, and platelets. This may raise your risk of infection, make you tired and weak (fatigue), and raise your risk of bleeding . Fever . Tiredness and weakness . Feeling dizzy . Trouble sleeping . Upper respiratory infection, bronchitis . Inflammation of the nasal passages and throat . Trouble breathing . Cough . Nausea . Decreased appetite (decreased hunger) . Loose bowel movements (diarrhea) . Constipation (unable to move bowels) . Inflammation of your stomach and/or intestines . Pain in your abdomen . Pain in your joints . Swelling of your legs, ankles and/or feet . Muscle cramps/spasms . Back pain . Rash and itching . Tremor Note: Each of the side effects above was reported in 15% or greater of patients treated with lenalidomide. Not all possible side effects are included above.  Warnings and Precautions . Blood clots and events such as stroke and heart attack. A blood clot in your leg may cause your leg to swell, appear red and warm, and/or cause pain. A blood clot in your lungs may cause trouble  breathing, pain when breathing, and/or chest pain. . Severe bone marrow depression . Changes in your liver function, which may cause liver failure and be life-threatening . Tumor lysis syndrome: This drug may act on the cancer cells very quickly. This may affect how your kidneys work and can be life-threatening. . Changes in your thyroid function . Severe allergic skin reaction which may be life-threatening. You may develop blisters on your skin that are filled with fluid or a severe red rash all over your body that may be painful. . This drug may raise your risk of getting a second cancer . You may develop a syndrome called  tumor flare reaction. You may have painful lymph nodes, enlarged spleen, fever and a rash. . This drug may make it more difficult to collect your stem cells if an autologous stem cell transplant is part of your treatment plan. . There is a rare increased risk of death in patients with chronic lymphocytic leukemia and a risk of early death (dying sooner) in patient with mantle cell lymphoma Note: Some of the side effects above are very rare. If you have concerns and/or questions, please discuss them with your medical team. Important Information . You will need to sign up for a special program called Revlimid REMS when you start taking this drug. Your nurse will help you get started. . Do not donate blood during your treatment and for 4 weeks after your treatment. . For males only, do not donate sperm during your treatment and for 4 weeks after your treatment because this drug is present in semen and may badly harm a baby.  How to Take Your Medication . Swallow the medicine whole with water, with or without food. Do not chew, break, or open it. . Take this medicine at the same time each day . Missed dose: If you miss a dose, take it as soon as you think about it ONLY if it has been less than 12 hours since your regular time. If it has been more than 12 hours, skip the missed  dose and contact your physician. Take your next dose at the regular time. Do not take 2 doses at the same time and do not double up on the next dose. . If you vomit a dose, take your next dose at the regular time. . Handling: Wash your hands after handling your medicine, your caretakers should not handle your medicine with bare hands and should wear latex gloves. . If you get any of the content of a broken capsules on your skin, you should wash the area of the skin well with soap and water right away. Call your doctor if you get a skin reaction . This drug may be present in the saliva, tears, sweat, urine, stool, vomit, semen, and vaginal secretions. Talk to your doctor and/or your nurse about the necessary precautions to take during this time. . Storage: Store this medicine in the original container at room temperature. Discuss with your nurse or your doctor how to dispose of unused medicine  Treating Side Effects . Manage tiredness by pacing your activities for the day. . Be sure to include periods of rest between energy-draining activities. . To decrease infection, wash your hands regularly. . Avoid close contact with people who have a cold, the flu, or other infections. . Take your temperature as your doctor or nurse tells you, and whenever you feel like you may have a fever. . To help decrease bleeding, use a soft toothbrush. Check with your nurse before using dental floss. . Be very careful when using knives or tools. . Use an electric shaver instead of a razor. . Ask your doctor or nurse about medicines that are available to help stop or lessen constipation. . If you are not able to move your bowels, check with your doctor or nurse before you use enemas, laxatives, or suppositories. . Drink plenty of fluids (a minimum of eight glasses per day is recommended). . If you throw up or have loose bowel movements, you should drink more fluids so that you do not become dehydrated (lack water in the  body from losing too much  fluid). . If you get diarrhea, eat low-fiber foods that are high in protein and calories and avoid foods that can irritate your digestive tracts or lead to cramping. . Ask your nurse or doctor about medicine that can lessen or stop your diarrhea. . To help with nausea and vomiting, eat small, frequent meals instead of three large meals a day. Choose foods and drinks that are at room temperature. Ask your nurse or doctor about other helpful tips and medicine that is available to help or stop lessen these symptoms. . To help with decreased appetite, eat small, frequent meals. . Eat high caloric food such as pudding, ice cream, yogurt and milkshakes. . If you get a rash, do not put anything on it unless your doctor or nurse says you may. Keep the area around the rash clean and dry. Ask your doctor for medicine if your rash bothers you. [name] Keeping your pain under control is important to your well-being. Please tell your doctor or nurse if you are experiencing pain. . If you are having trouble sleeping, talk to your nurse or doctor on tips to help you sleep better.    Food and Drug Interactions . There are no known interactions of lenalidomide with food. . Check with your doctor or pharmacist about all other prescription medicines and dietary supplements you are taking before starting this medicine as there are known drug interactions with lenalidomide. Also, check with your doctor or pharmacist before starting any new prescription or over-the-counter medicines, or dietary supplement to make sure that there are no interactions.  When to Call the Doctor Call your doctor or nurse if you have any of these symptoms and/or any new or unusual symptoms: . Fever of 100.5 F (38 C) or higher . Chills . Fatigue that interferes with your daily activities . Feeling dizzy or lightheaded . Easy bleeding or bruising . Your leg or arm is swollen, red, warm and/or painful . Painful lymph  nodes . Wheezing and/or trouble breathing . Chest pain or symptoms of a heart attack. Most heart attacks involve pain in the center of the chest that lasts more than a few minutes. The pain may go away and come back. It can feel like pressure, squeezing, fullness, or pain. Sometimes pain is felt in one or both arms, the back, neck, jaw, or stomach. If any of these symptoms last 2 minutes, call 911. [name] Symptoms of a stroke such as sudden numbness or weakness of your face, arm, or leg, mostly on one side of your body; sudden confusion, trouble speaking or understanding; sudden trouble seeing in one or both eyes; sudden trouble walking, feeling dizzy, loss of balance or coordination; or sudden, bad headache with no known cause. If you have any of these symptoms for 2 minutes, call 911. . Wheezing or trouble breathing . You cough up yellow, green, or bloody mucus . Feeling that your heart is beating in a fast or not normal way (palpitations) . Nausea that stops you from eating or drinking and/or is not relieved by prescribed medicines . Throwing up more than 3 times a day . Loose bowel movements (diarrhea) 4 times a day or loose bowel movements with lack of strength or a feeling of being dizzy . No bowel movement in 3 days or when you feel uncomfortable . Pain in your abdomen that does not go away . Weight gain of 5 pounds in one week (fluid retention) . Unexplained weight gain . Lasting loss of appetite or  rapid weight loss of five pounds in a week . Pain that does not go away, or is not relieved by prescribed medicines . Flu-like symptoms: fever, headache, muscle and joint aches, and fatigue (low energy, feeling weak) . A new rash or a rash that is not relieved by prescribed medicines . Signs of possible liver problems: dark urine, pale bowel movements, bad stomach pain, feeling very tired and weak, unusual itching, or yellowing of the eyes or skin . Signs of tumor lysis: Confusion or agitation,  decreased urine, nausea/vomiting, diarrhea, muscle cramping, numbness and/or tingling, seizures. . If you think you may be pregnant or may have impregnated your partner  Reproduction Warnings . Pregnancy warning: This drug can have harmful effects on the unborn baby. Women of childbearing potential should use 2 effective methods of birth control, one of which, must be a highly effective method of birth control, beginning 4 weeks before treatment starts, during your cancer treatment, including dose interruptions, and for at least 4 weeks after treatment. A highly effective method of birth control includes tubal ligation, intra-uterine device (IUD), hormonal (birth control pills, injections, patch and/or implants) or a partner's vasectomy. Let your doctor know right away if you think you may be pregnant . Two negative pregnancy tests are required in women of child-bearing potential prior to starting treatment. . You will need to have routine pregnancy tests while you are taking this drug. . Men with male partners of child-bearing potential should use effective methods of birth control during your cancer treatment and for at least 4 weeks after your cancer treatment. You should always wear a condom even if you have undergone a successful vasectomy. Let your doctor know right away if you think you may have impregnated your partner. . Breastfeeding warning: Women should not breast feed during treatment because this drug could enter the breast milk and cause harm to a breast feeding baby. . Fertility warning: Human fertility studies have not been done with this drug. Talk with your doctor or nurse if you plan to have children. Ask for information on sperm or egg banking.                                 SELF CARE ACTIVITIES WHILE ON CHEMOTHERAPY:  Hydration Increase your fluid intake 48 hours prior to treatment and drink at least 8 to 12 cups (64 ounces) of water/decaff  beverages per day after treatment. You can still have your cup of coffee or soda but these beverages do not count as part of your 8 to 12 cups that you need to drink daily. No alcohol intake.  Medications Continue taking your normal prescription medication as prescribed.  If you start any new herbal or new supplements please let us know first to make sure it is safe.  Mouth Care Have teeth cleaned professionally before starting treatment. Keep dentures and partial plates clean. Use soft toothbrush and do not use mouthwashes that contain alcohol. Biotene is a good mouthwash that is available at most pharmacies or may be ordered by calling [phone]. Use warm salt water gargles (1 teaspoon salt per 1 quart warm water) before and after meals and at bedtime. Or you may rinse with 2 tablespoons of three-percent hydrogen peroxide mixed in eight ounces of water. If you are still having problems with your mouth or sores in your mouth please call the clinic. If you need dental work, please let the  doctor know before you go for your appointment so that we can coordinate the best possible time for you in regards to your chemo regimen. You need to also let your dentist know that you are actively taking chemo. We may need to do labs prior to your dental appointment.   Skin Care Always use sunscreen that has not expired and with SPF (Sun Protection Factor) of 50 or higher. Wear hats to protect your head from the sun. Remember to use sunscreen on your hands, ears, face, & feet.  Use good moisturizing lotions such as udder cream, eucerin, or even Vaseline. Some chemotherapies can cause dry skin, color changes in your skin and nails.    . Avoid long, hot showers or baths. . Use gentle, fragrance-free soaps and laundry detergent. . Use moisturizers, preferably creams or ointments rather than lotions because the thicker consistency is better at preventing skin dehydration. Apply the cream or ointment within 15  minutes of showering. Reapply moisturizer at night, and moisturize your hands every time after you wash them.  Hair Loss (if your doctor says your hair will fall out)  . If your doctor says that your hair is likely to fall out, decide before you begin chemo whether you want to wear a wig. You may want to shop before treatment to match your hair color. . Hats, turbans, and scarves can also camouflage hair loss, although some people prefer to leave their heads uncovered. If you go bare-headed outdoors, be sure to use sunscreen on your scalp. . Cut your hair short. It eases the inconvenience of shedding lots of hair, but it also can reduce the emotional impact of watching your hair fall out. . Don't perm or color your hair during chemotherapy. Those chemical treatments are already damaging to hair and can enhance hair loss. Once your chemo treatments are done and your hair has grown back, it's OK to resume dyeing or perming hair. With chemotherapy, hair loss is almost always temporary. But when it grows back, it may be a different color or texture. In older adults who still had hair color before chemotherapy, the new growth may be completely gray.  Often, new hair is very fine and soft.  Infection Prevention Please wash your hands for at least 30 seconds using warm soapy water. Handwashing is the #1 way to prevent the spread of germs. Stay away from sick people or people who are getting over a cold. If you develop respiratory systems such as green/yellow mucus production or productive cough or persistent cough let us know and we will see if you need an antibiotic. It is a good idea to keep a pair of gloves on when going into grocery stores/[employer] to decrease your risk of coming into contact with germs on the carts, etc. Carry alcohol hand gel with you at all times and use it frequently if out in public. If your temperature reaches 100.5 or higher please call the clinic and let us know.  If it is after hours  or on the weekend please go to the ER if your temperature is over 100.5.  Please have your own personal thermometer at home to use.    Sex and bodily fluids If you are going to have sex, a condom must be used to protect the person that isn't taking chemotherapy. Chemo can decrease your libido (sex drive). For a few days after chemotherapy, chemotherapy can be excreted through your bodily fluids.  When using the toilet please close the lid  and flush the toilet twice.  Do this for a few day after you have had chemotherapy.   Effects of chemotherapy on your sex life Some changes are simple and won't last long. They won't affect your sex life permanently. Sometimes you may feel: . too tired . not strong enough to be very active . sick or sore  . not in the mood . anxious or low Your anxiety might not seem related to sex. For example, you may be worried about the cancer and how your treatment is going. Or you may be worried about money, or about how you family are coping with your illness. These things can cause stress, which can affect your interest in sex. It's important to talk to your partner about how you feel. Remember - the changes to your sex life don't usually last long. There's usually no medical reason to stop having sex during chemo. The drugs won't have any long term physical effects on your performance or enjoyment of sex. Cancer can't be passed on to your partner during sex  Contraception It's important to use reliable contraception during treatment. Avoid getting pregnant while you or your partner are having chemotherapy. This is because the drugs may harm the baby. Sometimes chemotherapy drugs can leave a man or woman infertile.  This means you would not be able to have children in the future. You might want to talk to someone about permanent infertility. It can be very difficult to learn that you may no longer be able to have children. Some people find counselling helpful. There might  be ways to preserve your fertility, although this is easier for men than for women. You may want to speak to a fertility expert. You can talk about sperm banking or harvesting your eggs. You can also ask about other fertility options, such as donor eggs. If you have or have had breast cancer, your doctor might advise you not to take the contraceptive pill. This is because the hormones in it might affect the cancer.  It is not known for sure whether or not chemotherapy drugs can be passed on through semen or secretions from the vagina. Because of this some doctors advise people to use a barrier method if you have sex during treatment. This applies to vaginal, anal or oral sex. Generally, doctors advise a barrier method only for the time you are actually having the treatment and for about a week after your treatment. Advice like this can be worrying, but this does not mean that you have to avoid being intimate with your partner. You can still have close contact with your partner and continue to enjoy sex.  Animals If you have cats or birds we just ask that you not change the litter or change the cage.  Please have someone else do this for you while you are on chemotherapy.   Food Safety During and After Cancer Treatment Food safety is important for people both during and after cancer treatment. Cancer and cancer treatments, such as chemotherapy, radiation therapy, and stem cell/bone marrow transplantation, often weaken the immune system. This makes it harder for your body to protect itself from foodborne illness, also called food poisoning. Foodborne illness is caused by eating food that contains harmful bacteria, parasites, or viruses.  Foods to avoid Some foods have a higher risk of becoming tainted with bacteria. These include: [name] Unwashed fresh fruit and vegetables, especially leafy vegetables that can hide dirt and other contaminants . Raw sprouts, such as alfalfa  sprouts . Raw or undercooked beef,  especially ground beef, or other raw or undercooked meat and poultry . Fatty, fried, or spicy foods immediately before or after treatment.  These can sit heavy on your stomach and make you feel nauseous. . Raw or undercooked shellfish, such as oysters. . Sushi and sashimi, which often contain raw fish.  . Unpasteurized beverages, such as unpasteurized fruit juices, raw milk, raw yogurt, or cider . Undercooked eggs, such as soft boiled, over easy, and poached; raw, unpasteurized eggs; or foods made with raw egg, such as homemade raw cookie dough and homemade mayonnaise Simple steps for food safety Shop smart. . Do not buy food stored or displayed in an unclean area. . Do not buy bruised or damaged fruits or vegetables. . Do not buy cans that have cracks, dents, or bulges. . Pick up foods that can spoil at the end of your shopping trip and store them in a cooler on the way home. Prepare and clean up foods carefully. . Rinse all fresh fruits and vegetables under running water, and dry them with a clean towel or paper towel. . Clean the top of cans before opening them. . After preparing food, wash your hands for 20 seconds with hot water and soap. Pay special attention to areas between fingers and under nails. . Clean your utensils and dishes with hot water and soap. [name] Disinfect your kitchen and cutting boards using 1 teaspoon of liquid, unscented bleach mixed into 1 quart of water.   Dispose of old food. . Eat canned and packaged food before its expiration date (the "use by" or "best before" date). . Consume refrigerated leftovers within 3 to 4 days. After that time, throw out the food. Even if the food does not smell or look spoiled, it still may be unsafe. Some bacteria, such as Listeria, can grow even on foods stored in the refrigerator if they are kept for too long. Take precautions when eating out. . At restaurants, avoid buffets and salad bars where food sits out for a long time and comes in  contact with many people. Food can become contaminated when someone with a virus, often a norovirus, or another "bug" handles it. . Put any leftover food in a "to-go" container yourself, rather than having the server do it. And, refrigerate leftovers as soon as you get home. . Choose restaurants that are clean and that are willing to prepare your food as you order it cooked.    MEDICATIONS:  Dexamethasone 62m tablet. Take 10 tablets (40 mg) on days 1, 8, 15 every 21 days.  Allopurinol 300 mg.  take 1 tablet daily.    Acyclovir 400 mg tablets:  Take 0.5 tablets (200 mg total) by mouth 2 (two) times daily.   Zoran/Ondansetron 839mtablet. Take 1 tablet every 8 hours as needed for nausea/vomiting. (#1 nausea med to take, this can constipate)  Compazine/Prochlorperazine 1043mablet. Take 1 tablet every 6 hours as needed for nausea/vomiting. (#2 nausea med to take, this can make you sleepy)   Over-the-Counter Meds:  Miralax 1 capful in 8 oz of fluid daily. May increase to two times a day if needed. This is a stool softener. If this doesn't work proceed you can add:  Senokot S-start with 1 tablet two times a day and increase to 4 tablets two times a day if needed. (total of 8 tablets in a 24 hour period). This is a stimulant laxative.   Call us [place] this does not  help your bowels move.   Imodium 58m capsule. Take 2 capsules after the 1st loose stool and then 1 capsule every 2 hours until you go a total of 12 hours without having a loose stool. Call the [facility] loose stools continue. If diarrhea occurs @ bedtime, take 2 capsules @ bedtime. Then take 2 capsules every 4 hours until morning. Call [facility]      Diarrhea Sheet  If you are having loose stools/diarrhea, please purchase Imodium and begin taking as outlined:  At the first sign of poorly formed or loose stools you should begin taking Imodium(loperamide) 2 mg capsules.  Take two caplets (473m followed by one caplet (93m37m every 2 hours until you have had no diarrhea for 12 hours.  During the night take two caplets (4mg37mt bedtime and continue every 4 hours during the night until the morning.  Stop taking Imodium only after there is no sign of diarrhea for 12 hours.    Always call the [facility] are having loose stools/diarrhea that you can't get under control.  Loose stools/disrrhea leads to dehydration (loss of water) in your body.  We have other options of trying to get the loose stools/diarrhea to stopped but you must let us [place]!     Constipation Sheet *Miralax in 8 oz of fluid daily.  May increase to two times a day if needed.  This is a stool softener.  If this not enough to keep your bowel regular:  You can add:  *Senokot S, start with one tablet twice a day and can increase to 4 tablets twice a day if needed.  This is a stimulant laxative.   Sometimes when you take pain medication you need BOTH a medicine to keep your stool soft and a medicine to help your bowel push it out!  Please call if the above does not work for you.   Do not go more than 2 days without a bowel movement.  It is very important that you do not become constipated.  It will make you feel sick to your stomach (nausea) and can cause abdominal pain and vomiting.      Nausea Sheet  Zofran/Ondansetron 8mg 50mlet. Take 1 tablet every 8 hours as needed for nausea/vomiting. (#1 nausea med to take, this can constipate)  Compazine/Prochlorperazine 10mg 54met. Take 1 tablet every 6 hours as needed for nausea/vomiting. (#2 nausea med to take, this can make you sleepy)  You can take these medications together or separately.  We would first like for you to try the Ondansetron by itself and then take the Prochloperizine if needed. But you are allowed to take both medications at the same time if your nausea is that severe.  If you are having persistent nausea (nausea that does not stop) please take these medications on a staggered  schedule so that the nausea medication stays in your body.  Please call the [facility] [place] amount of nausea that you are experiencing.  If you begin to vomit, you need to call the [facility] it is the weekend and you have vomited more than one time and cant get it to stop-go to the Emergency Room.  Persistent nausea/vomiting can lead to dehydration (loss of fluid in your body) and will make you feel terrible.   Ice chips, sips of clear liquids, foods that are @ room temperature, crackers, and toast tend to be better tolerated.  SYMPTOMS TO REPORT AS SOON AS POSSIBLE AFTER TREATMENT:  FEVER GREATER THAN 100.5 F  CHILLS WITH OR WITHOUT FEVER  NAUSEA AND VOMITING THAT IS NOT CONTROLLED WITH YOUR NAUSEA MEDICATION  UNUSUAL SHORTNESS OF BREATH  UNUSUAL BRUISING OR BLEEDING  TENDERNESS IN MOUTH AND THROAT WITH OR WITHOUT PRESENCE OF ULCERS  URINARY PROBLEMS  BOWEL PROBLEMS  UNUSUAL RASH    Wear comfortable clothing and clothing appropriate for easy access to any Portacath or PICC line. Let us know if there is anything that we can do to make your therapy better!    What to do if you need assistance after hours or on the weekends: CALL [phone].  HOLD on the line, do not hang up.  You will hear multiple messages but at the end you will be connected with a nurse triage line.  They will contact the doctor if necessary.  Most of the time they will be able to assist you.  Do not call the hospital operator.      I have been informed and understand all of the instructions given to me and have received a copy. I have been instructed to call the clinic [phone] or my family physician as soon as possible for continued medical care, if indicated. I do not have any more questions at this time but understand that I may call the [place] or the Patient Navigator at [phone] during office hours should I have questions or need assistance in obtaining  follow-up care.

## 2017-09-04 NOTE — Telephone Encounter (Signed)
APPROVED FOR $10,000 GRANT FOR COPAY ASSIST BY [facility] 08/04/17-08/03/18 EK#[id number] [phone] F

## 2017-09-05 ENCOUNTER — Other Ambulatory Visit (HOSPITAL_COMMUNITY): Payer: Self-pay | Admitting: Oncology

## 2017-09-05 ENCOUNTER — Other Ambulatory Visit (HOSPITAL_COMMUNITY): Payer: Self-pay | Admitting: Emergency Medicine

## 2017-09-05 ENCOUNTER — Telehealth (HOSPITAL_COMMUNITY): Payer: Self-pay | Admitting: Emergency Medicine

## 2017-09-05 DIAGNOSIS — C9000 Multiple myeloma not having achieved remission: Secondary | ICD-10-CM

## 2017-09-05 MED ORDER — ACYCLOVIR 400 MG PO TABS: 400.0000 mg | ORAL_TABLET | Freq: Two times a day (BID) | ORAL | 3 refills | Status: DC

## 2017-09-05 NOTE — Telephone Encounter (Signed)
Called pt to let him know that he  Had prescriptions at [facility] to pick up. RN would need him to also pick up aspirin 81 mg.  His revlimid is suppose to be delivered to the clinic today. RN would go over all his information on Monday when he comes in for his velcade injection.  He verbalized understanding.

## 2017-09-09 ENCOUNTER — Encounter (HOSPITAL_COMMUNITY): Payer: Medicare Other

## 2017-09-09 ENCOUNTER — Ambulatory Visit (HOSPITAL_COMMUNITY): Payer: Medicare Other

## 2017-09-09 ENCOUNTER — Other Ambulatory Visit (HOSPITAL_COMMUNITY): Payer: Self-pay | Admitting: Oncology

## 2017-09-09 ENCOUNTER — Encounter (HOSPITAL_BASED_OUTPATIENT_CLINIC_OR_DEPARTMENT_OTHER): Payer: Medicare Other

## 2017-09-09 DIAGNOSIS — D472 Monoclonal gammopathy: Secondary | ICD-10-CM | POA: Diagnosis not present

## 2017-09-09 DIAGNOSIS — C9000 Multiple myeloma not having achieved remission: Secondary | ICD-10-CM

## 2017-09-09 DIAGNOSIS — Z5112 Encounter for antineoplastic immunotherapy: Secondary | ICD-10-CM | POA: Diagnosis present

## 2017-09-09 DIAGNOSIS — Z79899 Other long term (current) drug therapy: Secondary | ICD-10-CM | POA: Diagnosis not present

## 2017-09-09 DIAGNOSIS — R000 Tachycardia, unspecified: Secondary | ICD-10-CM | POA: Diagnosis not present

## 2017-09-09 LAB — CBC WITH DIFFERENTIAL/PLATELET
Basophils Absolute: 0.1 10*3/uL (ref 0.0–0.1)
Basophils Relative: 2 %
EOS PCT: 2 %
Eosinophils Absolute: 0.1 10*3/uL (ref 0.0–0.7)
HCT: 26.5 % — ABNORMAL LOW (ref 39.0–52.0)
Hemoglobin: 8.7 g/dL — ABNORMAL LOW (ref 13.0–17.0)
LYMPHS ABS: 2.6 10*3/uL (ref 0.7–4.0)
LYMPHS PCT: 32 %
MCH: 30.2 pg (ref 26.0–34.0)
MCHC: 32.8 g/dL (ref 30.0–36.0)
MCV: 92 fL (ref 78.0–100.0)
MONO ABS: 0.6 10*3/uL (ref 0.1–1.0)
MONOS PCT: 8 %
Neutro Abs: 4.6 10*3/uL (ref 1.7–7.7)
Neutrophils Relative %: 57 %
PLATELETS: 24 10*3/uL — AB (ref 150–400)
RBC: 2.88 MIL/uL — ABNORMAL LOW (ref 4.22–5.81)
RDW: 15.8 % — AB (ref 11.5–15.5)
WBC: 8 10*3/uL (ref 4.0–10.5)

## 2017-09-09 LAB — COMPREHENSIVE METABOLIC PANEL
ALT: 20 U/L (ref 17–63)
AST: 19 U/L (ref 15–41)
Albumin: 4.2 g/dL (ref 3.5–5.0)
Alkaline Phosphatase: 98 U/L (ref 38–126)
Anion gap: 11 (ref 5–15)
BILIRUBIN TOTAL: 0.9 mg/dL (ref 0.3–1.2)
BUN: 59 mg/dL — ABNORMAL HIGH (ref 6–20)
CHLORIDE: 107 mmol/L (ref 101–111)
CO2: 18 mmol/L — ABNORMAL LOW (ref 22–32)
CREATININE: 6.23 mg/dL — AB (ref 0.61–1.24)
Calcium: 10 mg/dL (ref 8.9–10.3)
GFR, EST AFRICAN AMERICAN: 10 mL/min — AB (ref >60)
GFR, EST NON AFRICAN AMERICAN: 8 mL/min — AB (ref >60)
Glucose, Bld: 187 mg/dL — ABNORMAL HIGH (ref 65–99)
POTASSIUM: 6.4 mmol/L — AB (ref 3.5–5.1)
Sodium: 136 mmol/L (ref 135–145)
TOTAL PROTEIN: 8.4 g/dL — AB (ref 6.5–8.1)

## 2017-09-09 MED ORDER — SODIUM POLYSTYRENE SULFONATE 15 GM/60ML PO SUSP: 30.0000 g | Freq: Once | ORAL | 0 refills | Status: AC

## 2017-09-09 MED ORDER — BORTEZOMIB CHEMO SQ INJECTION 3.5 MG (2.5MG/ML)
1.3000 mg/m2 | Freq: Once | INTRAMUSCULAR | Status: AC
  Administered 2017-09-09: 2.75 mg via SUBCUTANEOUS
  Filled 2017-09-09: qty 2.75

## 2017-09-09 MED ORDER — PROCHLORPERAZINE MALEATE 10 MG PO TABS
10.0000 mg | ORAL_TABLET | Freq: Once | ORAL | Status: AC
  Administered 2017-09-09: 10 mg via ORAL

## 2017-09-09 NOTE — Progress Notes (Signed)
CRITICAL VALUE STICKER  CRITICAL VALUE: potassium 6.4 DATE & TIME NOTIFIED: 11/12 @ 34 MD NOTIFIED: Dr. Twana First Response: orders received for Kayexalate 30 grams x 1 dose sent in to patient's pharmacy

## 2017-09-09 NOTE — Progress Notes (Signed)
CRITICAL VALUE STICKER  CRITICAL VALUE: Platelets 24,000 TIME OF NOTIFICATION: 11/12 @1407  MD NOTIFIED: Dr. Twana First RESPONSE: No orders at this time

## 2017-09-09 NOTE — Progress Notes (Signed)
Chemotherapy teaching completed.  Extensive teaching packet given.  Consent signed for RVD.

## 2017-09-10 ENCOUNTER — Telehealth (HOSPITAL_COMMUNITY): Payer: Self-pay

## 2017-09-10 ENCOUNTER — Encounter (HOSPITAL_COMMUNITY): Payer: Self-pay

## 2017-09-10 LAB — PROTEIN ELECTROPHORESIS, SERUM
A/G Ratio: 1 (ref 0.7–1.7)
Albumin ELP: 3.8 g/dL (ref 2.9–4.4)
Alpha-1-Globulin: 0.2 g/dL (ref 0.0–0.4)
Alpha-2-Globulin: 1 g/dL (ref 0.4–1.0)
BETA GLOBULIN: 1.1 g/dL (ref 0.7–1.3)
GAMMA GLOBULIN: 1.5 g/dL (ref 0.4–1.8)
Globulin, Total: 3.9 g/dL (ref 2.2–3.9)
M-SPIKE, %: 0.9 g/dL — AB
TOTAL PROTEIN ELP: 7.7 g/dL (ref 6.0–8.5)

## 2017-09-10 LAB — IMMUNOFIXATION ELECTROPHORESIS
IGM (IMMUNOGLOBULIN M), SRM: 20 mg/dL (ref 20–172)
IgA: 1079 mg/dL — ABNORMAL HIGH (ref 61–437)
IgG (Immunoglobin G), Serum: 331 mg/dL — ABNORMAL LOW (ref 700–1600)
TOTAL PROTEIN ELP: 7.7 g/dL (ref 6.0–8.5)

## 2017-09-10 LAB — IGG, IGA, IGM
IGG (IMMUNOGLOBIN G), SERUM: 316 mg/dL — AB (ref 700–1600)
IGM (IMMUNOGLOBULIN M), SRM: 19 mg/dL — AB (ref 20–172)
IgA: 1103 mg/dL — ABNORMAL HIGH (ref 61–437)

## 2017-09-10 LAB — KAPPA/LAMBDA LIGHT CHAINS
KAPPA, LAMDA LIGHT CHAIN RATIO: 2077.7 — AB (ref 0.26–1.65)
Kappa free light chain: 17868.2 mg/L — ABNORMAL HIGH (ref 3.3–19.4)
LAMDA FREE LIGHT CHAINS: 8.6 mg/L (ref 5.7–26.3)

## 2017-09-10 NOTE — Progress Notes (Signed)
Tom Marshall presents today for injection per MD orders. Velcade 2.75mg  administered IM in right Abdomen. Administration without incident. Patient tolerated well.   Treatment given per orders. Patient tolerated it well without problems. Vitals stable and discharged home from clinic ambulatory. Follow up as scheduled.

## 2017-09-10 NOTE — Telephone Encounter (Signed)
24 hour follow up -patient states he is doing well, no complaints.

## 2017-09-11 LAB — CHROMOSOME ANALYSIS, BONE MARROW

## 2017-09-11 LAB — TISSUE HYBRIDIZATION (BONE MARROW)-NCBH

## 2017-09-11 NOTE — Progress Notes (Signed)
Tom Marshall, Tom Marshall   CLINIC:  Medical Oncology/Hematology  PCP:  Asencion Noble, MD [address] [place] [address] [phone]   REASON FOR VISIT:  Follow-up for IgA kappa multiple myeloma with >95% plasma cells in bone marrow  CURRENT THERAPY: Revlimid/Decadron/Velcade, beginning 09/09/17    HISTORY OF PRESENT ILLNESS:  (from Dr. Laverle Patter last note on 08/29/17)  Tom Marshall 66 y.o. 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 Patient was initially admitted to [facility] from 07/23/2017 through 07/31/2017 with a 10 day history of lower back pain, fatigue, pallor. CT of the abdomen and pelvis in the emergency department revealed Interval increase in size of a large cyst the inferior pole of the left kidney 10.4 cm greatest size, and a number of questionable lytic lesions are identified within thoracolumbar vertebra, subtle lytic metastases not excluded. Fracture identified at the left transverse process of L3, new since 2010.The patient was also noted to be in acute kidney injury with serum creatinine of 7.34, calcium 11.7. (baseline creatinine 0.8-1.0 from July 2018) CBC on 07/23/2017 demonstrated WBC 8.2K, hemoglobin 7.9 g/dL, hematocrit 23%, platelet count 56K. SPEP 07/24/17 demonstrated two peaks in the beta-gamma region which may represent monoclonal protein. Immunofixation shows IgA monoclonal protein with kappa light chain specificity. Free kappa light chains 14,886.4, lambda light chains 10.2, free kappa/lambda light chain ratio 1459.45. Immunoglobulins demonstrated IgA 788, IgG 261. Urine IFE demonstrated Bence Jones Protein positive; kappa type. He received pamidronate on 07/26/17 with improvement in his hypercalcemia. At the time of discharge on 07/31/2017 CBC demonstrated WBC 7.3K, hemoglobin 7.9 g/dL, platelet count 28K. Serum iron levels were normal, ferritin was elevated at 911. At the time of  discharge his creatinine was 5.98, calcium 8.3.     INTERVAL HISTORY:  [name] 66 y.o. male returns for follow-up for multiple myeloma.   Here for cycle #1, day 4 of RVD therapy for recently diagnosed multiple myeloma.   Since his last visit, he was hospitalized overnight d/t severe anemia and thrombocytopenia. He received 3 units PRBCs and 1 unit platelets during hospitalization.  He was discharged on 08/30/17 with Hgb 8.2 g/dL and Plts 51,000 at that time.   Overall, he tells me he is feeling pretty well. Appetite and energy levels both 75%.  He feels a little weak at times.  He is taking the Revlimid and Decadron as prescribed; "I'm not having any side effects that I know of."  Denies diarrhea or rash.  Denies any shortness of breath, severe fatigue, or frank bleeding episodes. Denies any blood in his stools, hematuria, or nosebleeds.   He does report bilateral shoulder pain, with his left shoulder/scapula being worse than the right.  "I couldn't sleep last night because I couldn't find a comfortable position because of the pain."    He shares with me that he thinks he is supposed to see his nephrologist, Dr. Hinda Lenis soon regarding his declining kidney function.     REVIEW OF SYSTEMS:  Review of Systems  Constitutional: Positive for fatigue.  HENT:  Negative.  Negative for nosebleeds.   Eyes: Negative.   Respiratory: Negative.  Negative for shortness of breath.   Cardiovascular: Negative.   Gastrointestinal: Negative.  Negative for constipation, diarrhea, nausea and vomiting.  Endocrine: Negative.   Genitourinary: Negative.    Musculoskeletal: Positive for arthralgias.  Skin: Negative.  Negative for rash.  Neurological: Negative.   Hematological: Negative.   Psychiatric/Behavioral: Positive  for sleep disturbance.     PAST MEDICAL/SURGICAL HISTORY:  Past Medical History:  Diagnosis Date  . Cancer ([place])    multiple myeloma  . Cancer of right kidney ([place])   . Cervical  dystonia   . Diabetes mellitus without complication [facility])    Past Surgical History:  Procedure Laterality Date  . CHOLECYSTECTOMY  2007  . NEPHRECTOMY Right 1998   cancer     SOCIAL HISTORY:  Social History   Socioeconomic History  . Marital status: Single    Spouse name: Not on file  . Number of children: Not on file  . Years of education: Not on file  . Highest education level: Not on file  Social Needs  . Financial resource strain: Not on file  . Food insecurity - worry: Not on file  . Food insecurity - inability: Not on file  . Transportation needs - medical: Not on file  . Transportation needs - non-medical: Not on file  Occupational History  . Occupation: Marine scientist, Insurance underwriter  Tobacco Use  . Smoking status: Never Smoker  . Smokeless tobacco: Current User    Types: Chew  Substance and Sexual Activity  . Alcohol use: No  . Drug use: No  . Sexual activity: Not on file  Other Topics Concern  . Not on file  Social History Narrative  . Not on file    FAMILY HISTORY:  Family History  Problem Relation Age of Onset  . Heart failure Mother 77  . Dementia Father     CURRENT MEDICATIONS:  Outpatient Encounter Medications as of 09/12/2017  Medication Sig  . acyclovir (ZOVIRAX) 400 MG tablet Take 1 tablet (400 mg total) 2 (two) times daily by mouth.  [name] allopurinol (ZYLOPRIM) 300 MG tablet Take 300 mg by mouth every morning.  [name] aspirin EC 81 MG tablet Take 81 mg daily by mouth.  . bortezomib IV (VELCADE) 3.5 MG injection Inject once into the vein. Days 1,4,8,11 every 21 days  . cyclobenzaprine (FLEXERIL) 10 MG tablet Take 10 mg by mouth at bedtime.  [name] glipiZIDE (GLIPIZIDE XL) 2.5 MG 24 hr tablet Take 1 tablet (2.5 mg total) by mouth daily with breakfast.  . Ibuprofen-Diphenhydramine Cit (ADVIL PM PO) Take by mouth.  . lenalidomide (REVLIMID) 25 MG capsule Take 1 capsule (25 mg total) by mouth daily. Take 1 capsule on days 1-14. Repeat every 21 days.  .  Multiple Vitamins-Minerals (CENTRUM SILVER 50+MEN) TABS Take 1 tablet by mouth every morning.  . pravastatin (PRAVACHOL) 20 MG tablet Take 20 mg by mouth every morning.  . sodium bicarbonate 650 MG tablet Take 1 tablet (650 mg total) by mouth 2 (two) times daily. (Patient taking differently: Take 650 mg by mouth 3 (three) times daily. )  . traMADol (ULTRAM) 50 MG tablet Take 1 tablet (50 mg total) every 6 (six) hours as needed by mouth.   No facility-administered encounter medications on file as of 09/12/2017.     ALLERGIES:  No Known Allergies   PHYSICAL EXAM:  ECOG Performance status: 1 - Symptomatic; remains independent   Vitals:   09/12/17 1151  BP: (!) 164/73  Pulse: (!) 112  Resp: 20  Temp: 98.2 F (36.8 C)  SpO2: 99%   Filed Weights   09/12/17 1151  Weight: 206 lb 9.6 oz (93.7 kg)    Physical Exam  Constitutional: He is oriented to person, place, and time and well-developed, well-nourished, and in no distress.  HENT:  Head: Normocephalic.  Mouth/Throat: Oropharynx  is clear and moist. No oropharyngeal exudate.  Eyes: Conjunctivae are normal. Pupils are equal, round, and reactive to light. No scleral icterus.  Neck: Normal range of motion. Neck supple.  Cardiovascular: Regular rhythm.  Tachycardic  Pulmonary/Chest: Effort normal and breath sounds normal. No respiratory distress. He has no wheezes.  Abdominal: Soft. Bowel sounds are normal. There is no tenderness.  Musculoskeletal: Normal range of motion. He exhibits edema (Trace ankle edema).  Lymphadenopathy:    He has no cervical adenopathy.       Right: No supraclavicular adenopathy present.       Left: No supraclavicular adenopathy present.  Neurological: He is alert and oriented to person, place, and time. No cranial nerve deficit.  Skin: Skin is warm and dry. No rash noted.  Psychiatric: Mood, memory, affect and judgment normal.  Nursing note and vitals reviewed.    LABORATORY DATA:  I have reviewed the  labs as listed.  CBC    Component Value Date/Time   WBC 8.0 09/09/2017 1313   RBC 2.88 (L) 09/09/2017 1313   HGB 8.7 (L) 09/09/2017 1313   HCT 26.5 (L) 09/09/2017 1313   PLT 24 (LL) 09/09/2017 1313   MCV 92.0 09/09/2017 1313   MCH 30.2 09/09/2017 1313   MCHC 32.8 09/09/2017 1313   RDW 15.8 (H) 09/09/2017 1313   LYMPHSABS 2.6 09/09/2017 1313   MONOABS 0.6 09/09/2017 1313   EOSABS 0.1 09/09/2017 1313   BASOSABS 0.1 09/09/2017 1313   CMP Latest Ref Rng & Units 09/09/2017 08/30/2017 08/30/2017  Glucose 65 - 99 mg/dL 187(H) - 103(H)  BUN 6 - 20 mg/dL 59(H) - 45(H)  Creatinine 0.61 - 1.24 mg/dL 6.23(H) - 5.45(H)  Sodium 135 - 145 mmol/L 136 - 138  Potassium 3.5 - 5.1 mmol/L 6.4(HH) 4.7 5.2(H)  Chloride 101 - 111 mmol/L 107 - 107  CO2 22 - 32 mmol/L 18(L) - 19(L)  Calcium 8.9 - 10.3 mg/dL 10.0 - 9.0  Total Protein 6.5 - 8.1 g/dL 8.4(H) - -  Total Bilirubin 0.3 - 1.2 mg/dL 0.9 - -  Alkaline Phos 38 - 126 U/L 98 - -  AST 15 - 41 U/L 19 - -  ALT 17 - 63 U/L 20 - -   Results for VEGAS, FRITZE (MRN [id number])   Ref. Range 09/09/2017 13:14  Total Protein ELP Latest Ref Range: 6.0 - 8.5 g/dL 7.7  Albumin ELP Latest Ref Range: 2.9 - 4.4 g/dL 3.8  Globulin, Total Latest Ref Range: 2.2 - 3.9 g/dL 3.9  A/G Ratio Latest Ref Range: 0.7 - 1.7  1.0  Alpha-1-Globulin Latest Ref Range: 0.0 - 0.4 g/dL 0.2  Alpha-2-Globulin Latest Ref Range: 0.4 - 1.0 g/dL 1.0  Beta Globulin Latest Ref Range: 0.7 - 1.3 g/dL 1.1  Gamma Globulin Latest Ref Range: 0.4 - 1.8 g/dL 1.5  M-SPIKE, % Latest Ref Range: Not Observed g/dL 0.9 (H)  SPE Interp. Unknown Comment  Comment Unknown Comment  IgG (Immunoglobin G), Serum Latest Ref Range: 700 - 1,600 mg/dL 316 (L)  IgA Latest Ref Range: 61 - 437 mg/dL 1,103 (H)  IgM (Immunoglobulin M), Srm Latest Ref Range: 20 - 172 mg/dL 19 (L)  Kappa free light chain Latest Ref Range: 3.3 - 19.4 mg/L 17,868.2 (H)  Lamda free light chains Latest Ref Range: 5.7 - 26.3 mg/L 8.6    Kappa, lamda light chain ratio Latest Ref Range: 0.26 - 1.65  2,077.70 (H)       PENDING LABS:    DIAGNOSTIC  IMAGING:  *The following radiologic images and reports have been reviewed independently and agree with below findings.  PET scan: 08/13/17 CLINICAL DATA:  Initial treatment strategy for staging of multiple myeloma.  EXAM: NUCLEAR MEDICINE PET WHOLE BODY  TECHNIQUE: 10.7 mCi F-18 FDG was injected intravenously. Full-ring PET imaging was performed from the vertex to the feet after the radiotracer. CT data was obtained and used for attenuation correction and anatomic localization.  FASTING BLOOD GLUCOSE:  Value: 148 mg/dl  COMPARISON:  Skeletal survey of 08/02/2017. Abdominopelvic CT 07/23/2017. Chest CT 01/25/2009.  FINDINGS: HEAD/NECK  No areas of abnormal hypermetabolism.  No cervical adenopathy.  CHEST  No pulmonary parenchymal or thoracic nodal hypermetabolism identified. Mild bilateral gynecomastia. Mild cardiomegaly with multivessel coronary artery atherosclerosis. No thoracic adenopathy. Bibasilar scarring.  ABDOMEN/PELVIS  No abdominopelvic nodal or parenchymal hypermetabolism. Right nephrectomy. Normal adrenal glands. Abdominal aortic atherosclerosis. Caudate enlargement and medial segment left liver lobe atrophy. Cholecystectomy. Lower pole left renal dominant 9.8 cm low-density lesion is most consistent with a cyst. Fat containing left larger than right inguinal hernias. Mild prostatomegaly.  SKELETON  Relatively diffuse heterogeneous marrow hypermetabolism. Example focus within the sternum measures a S.U.V. max of 6.7. Bilateral femoral shaft foci of hypermetabolism without well-defined CT correlate. Example in the right proximal femoral shaft at a S.U.V. max of 4.2. Heterogeneous marrow density is most apparent in the thoracic spine and bilateral ribs. Cortical irregularity within the tenth and eighth posterolateral left ribs,  likely due to a pathologic fractures. Heterogeneous density within the left greater than right scapula with suggestion of cortical destruction on the left on image 76/series 4.  EXTREMITIES  No soft tissue hypermetabolism within the extremities.  IMPRESSION: 1. Extensive marrow hypermetabolism and heterogeneous density, most consistent with relatively diffuse marrow involvement by myeloma. 2. No evidence of hypermetabolic soft tissue disease. 3. Coronary artery atherosclerosis. Aortic Atherosclerosis (ICD10-I70.0). 4. Hepatic morphology for which mild cirrhosis cannot be excluded. Correlate with risk factors.   Electronically Signed   By: Abigail Miyamoto M.D.   On: 08/13/2017 14:20    PATHOLOGY:  Bone marrow biopsy: 08/15/17     Cytogenetics: 08/15/17         ASSESSMENT & PLAN:   IgA kappa multiple myeloma with >95% plasma cells in bone marrow:  -Most recent myeloma labs on 09/09/17 reviewed. M-spike 0.9%. Kappa/lambda light chain ratio markedly elevated at 2,077.70. -Started Revlimid/Velcade/Decadron on 09/09/17.  -Due for Velcade injection today. Dr. Talbert Cage reviewed labs and OK to proceed with Velcade inj as scheduled today.   -Return to cancer center as scheduled for subsequent Velcade injections. Continue Revlimid/Decadron as directed (daily on days 1-21).   -Will add-on follow-up visit for day 1, cycle #2 in 09/2017.  Encouraged him to call us if he experiences any new/concerning symptoms and we can certainly see him sooner, if needed. He agreed with this plan.   Anemia/Thrombocytopenia:  -Likely secondary to multiple myeloma. Recently hospitalized on 08/29/17 with critically low Hgb at 5.9 g/dL; also found to have platelet count of 25,000 on 08/29/17 prior to admission. Received 3 units PRBCs and 1 unit platelets during hospital stay with improvement in his Hgb to ~8 and Plts to ~50K.   -Most recent labs reviewed from 09/09/17. Hgb 8.7 g/dL and Plts 24,000. No  symptomatic anemia and no reported bleeding episodes.   Bone pain:  -Likely secondary to malignant disease burden.  His pain is affecting his ability to sleep. He has previously tolerated Tramadol well.  -New Rx for  Tramadol 50 mg po Q6Hprn, #30, no refills given to patient today. Reviewed with him how to take this medication, common side effects and to use caution when taking opiates, particularly with driving.  If Tramadol is ineffective, then could consider hydrocodone or oxycodone.  -Reinforced the importance of adequate bowel regimen to prevent constipation when taking opiates.   Kidney failure:  -He only has 1 kidney and may need dialysis.  -Managed by nephrology. Strongly encouraged Mr. Favorite to follow-up with his kidney doctor as directed.   Goals of care:  -Noted to be DNR during recent hospitalization. Briefly discussed code status and goals of care with him today. He would like to remain Full Code as an outpatient; "I would want you to try to bring me back."   -We discussed that multiple myeloma is often not curable, but treatable. The goal is to obtain remission and maintain remission without progression for as long as able.  He understands that his disease is not curable, but is treatable.     Dispo:  -Return to cancer center as scheduled for subsequent Velcade injections.  -Will add-on follow-up visit on day 1, cycle #2 of RVD treatments.    All questions were answered to patient's stated satisfaction. Encouraged patient to call with any new concerns or questions before his next visit to the cancer center and we can certain see him sooner, if needed.    Plan of care discussed with Dr. Talbert Cage, who agrees with the above aforementioned.    Orders placed this encounter:  No orders of the defined types were placed in this encounter.     Mike Craze, NP [place] [phone]

## 2017-09-12 ENCOUNTER — Encounter (HOSPITAL_COMMUNITY): Payer: Self-pay

## 2017-09-12 ENCOUNTER — Encounter (HOSPITAL_BASED_OUTPATIENT_CLINIC_OR_DEPARTMENT_OTHER): Payer: Medicare Other

## 2017-09-12 ENCOUNTER — Encounter (HOSPITAL_COMMUNITY): Payer: Self-pay | Admitting: Adult Health

## 2017-09-12 ENCOUNTER — Encounter (HOSPITAL_BASED_OUTPATIENT_CLINIC_OR_DEPARTMENT_OTHER): Payer: Medicare Other | Admitting: Adult Health

## 2017-09-12 ENCOUNTER — Other Ambulatory Visit: Payer: Self-pay

## 2017-09-12 VITALS — BP 164/73 | HR 112 | Temp 98.2°F | Resp 20 | Wt 206.6 lb

## 2017-09-12 DIAGNOSIS — C9000 Multiple myeloma not having achieved remission: Secondary | ICD-10-CM

## 2017-09-12 DIAGNOSIS — N19 Unspecified kidney failure: Secondary | ICD-10-CM | POA: Diagnosis not present

## 2017-09-12 DIAGNOSIS — M898X9 Other specified disorders of bone, unspecified site: Secondary | ICD-10-CM

## 2017-09-12 DIAGNOSIS — D649 Anemia, unspecified: Secondary | ICD-10-CM

## 2017-09-12 DIAGNOSIS — Z905 Acquired absence of kidney: Secondary | ICD-10-CM | POA: Diagnosis not present

## 2017-09-12 DIAGNOSIS — Z5112 Encounter for antineoplastic immunotherapy: Secondary | ICD-10-CM

## 2017-09-12 DIAGNOSIS — Z5111 Encounter for antineoplastic chemotherapy: Secondary | ICD-10-CM

## 2017-09-12 DIAGNOSIS — M898X1 Other specified disorders of bone, shoulder: Secondary | ICD-10-CM | POA: Diagnosis not present

## 2017-09-12 DIAGNOSIS — D696 Thrombocytopenia, unspecified: Secondary | ICD-10-CM

## 2017-09-12 MED ORDER — PROCHLORPERAZINE MALEATE 10 MG PO TABS
ORAL_TABLET | ORAL | Status: AC
  Filled 2017-09-12: qty 1

## 2017-09-12 MED ORDER — BORTEZOMIB CHEMO SQ INJECTION 3.5 MG (2.5MG/ML)
1.3000 mg/m2 | Freq: Once | INTRAMUSCULAR | Status: AC
  Administered 2017-09-12: 2.75 mg via SUBCUTANEOUS
  Filled 2017-09-12: qty 2.75

## 2017-09-12 MED ORDER — TRAMADOL HCL 50 MG PO TABS: 50.0000 mg | ORAL_TABLET | Freq: Four times a day (QID) | ORAL | 0 refills | Status: DC | PRN

## 2017-09-12 MED ORDER — PROCHLORPERAZINE MALEATE 10 MG PO TABS
10.0000 mg | ORAL_TABLET | Freq: Once | ORAL | Status: AC
  Administered 2017-09-12: 10 mg via ORAL

## 2017-09-12 NOTE — Patient Instructions (Signed)
[place] at [facility] Discharge Instructions  RECOMMENDATIONS MADE BY THE CONSULTANT AND ANY TEST RESULTS WILL BE SENT TO YOUR REFERRING PHYSICIAN.  You were seen today by Mike Craze, NP Follow up next week for your injections We will see you back for an office visit in 1 month when you start Cycle 2 See schedulers up front for appointments   Thank you for choosing [place] at [facility] to provide your oncology and hematology care.  To afford each patient quality time with our provider, please arrive at least 15 minutes before your scheduled appointment time.    If you have a lab appointment with the [place] please come in thru the  Main Entrance and check in at the main information desk  You need to re-schedule your appointment should you arrive 10 or more minutes late.  We strive to give you quality time with our providers, and arriving late affects you and other patients whose appointments are after yours.  Also, if you no show three or more times for appointments you may be dismissed from the clinic at the providers discretion.     Again, thank you for choosing [facility].  Our hope is that these requests will decrease the amount of time that you wait before being seen by our physicians.       _____________________________________________________________  Should you have questions after your visit to [facility], please contact our office at [phone] between the hours of 8:30 a.m. and 4:30 p.m.  Voicemails left after 4:30 p.m. will not be returned until the following business day.  For prescription refill requests, have your pharmacy contact our office.       Resources For Cancer Patients and their Caregivers ? American Cancer Society: Can assist with transportation, wigs, general needs, runs Look Good Feel Better.        (813)591-5750 ? Cancer Care: Provides financial assistance,  online support groups, medication/co-pay assistance.  1-800-813-HOPE (925)485-0699) ? [facility] Assists [place] cancer patients and their families through emotional , educational and financial support.  908-564-1346 ? [place] DSS Where to apply for food stamps, Medicaid and utility assistance. 703-632-8013 ? RCATS: Transportation to medical appointments. 409-376-2271 ? Social Security Administration: May apply for disability if have a Stage IV cancer. 214-646-7196 941-307-1037 ? [employer], Disability and Transit Services: Assists with nutrition, care and transit needs. [place] Support Programs: @10RELATIVEDAYS @ > Cancer Support Group  2nd Tuesday of the month 1pm-2pm, Journey Room  > Creative Journey  3rd Tuesday of the month 1130am-1pm, Journey Room  > Look Good Feel Better  1st Wednesday of the month 10am-12 noon, Journey Room (Call [place] to register [phone])

## 2017-09-12 NOTE — Patient Instructions (Signed)
[facility] Discharge Instructions for Patients Receiving Chemotherapy   Beginning January 23rd 2017 lab work for the Cancer Center will be done in the  Main lab at [place] on 1st floor. If you have a lab appointment with the Cancer Center please come in thru the  Main Entrance and check in at the main information desk   Today you received the following chemotherapy agents Velcade injection. Follow-up as scheduled. Call clinic for any questions or concerns  To help prevent nausea and vomiting after your treatment, we encourage you to take your nausea medication   If you develop nausea and vomiting, or diarrhea that is not controlled by your medication, call the clinic.  The clinic phone number is [phone]. Office hours are Monday-Friday 8:30am-5:00pm.  BELOW ARE SYMPTOMS THAT SHOULD BE REPORTED IMMEDIATELY:  *FEVER GREATER THAN 101.0 F  *CHILLS WITH OR WITHOUT FEVER  NAUSEA AND VOMITING THAT IS NOT CONTROLLED WITH YOUR NAUSEA MEDICATION  *UNUSUAL SHORTNESS OF BREATH  *UNUSUAL BRUISING OR BLEEDING  TENDERNESS IN MOUTH AND THROAT WITH OR WITHOUT PRESENCE OF ULCERS  *URINARY PROBLEMS  *BOWEL PROBLEMS  UNUSUAL RASH Items with * indicate a potential emergency and should be followed up as soon as possible. If you have an emergency after office hours please contact your primary care physician or go to the nearest emergency department.  Please call the clinic during office hours if you have any questions or concerns.   You may also contact the Patient Navigator at [phone] should you have any questions or need assistance in obtaining follow up care.      Resources For Cancer Patients and their Caregivers ? American Cancer Society: Can assist with transportation, wigs, general needs, runs Look Good Feel Better.        1-888-227-6333 ? Cancer Care: Provides financial assistance, online support groups, medication/co-pay assistance.   1-800-813-HOPE (4673) ? [facility] Assists [place] cancer patients and their families through emotional , educational and financial support.  336-427-4357 ? [place] DSS Where to apply for food stamps, Medicaid and utility assistance. 336-342-1394 ? RCATS: Transportation to medical appointments. 336-347-2287 ? Social Security Administration: May apply for disability if have a Stage IV cancer. 336-342-7796 1-800-772-1213 ? [place] Aging, Disability and Transit Services: Assists with nutrition, care and transit needs. 336-349-2343         

## 2017-09-12 NOTE — Progress Notes (Signed)
[name] tolerated Velcade injection well without complaints or incident.Pt approved for Velcade injection today per Mike Craze NP.Pt continues to take Revlimid as prescribed without issues VSS Pt discharged self ambulatory in satisfactory condition

## 2017-09-16 ENCOUNTER — Encounter (HOSPITAL_COMMUNITY): Payer: Medicare Other

## 2017-09-16 ENCOUNTER — Encounter (HOSPITAL_COMMUNITY): Payer: Self-pay

## 2017-09-16 ENCOUNTER — Encounter (HOSPITAL_BASED_OUTPATIENT_CLINIC_OR_DEPARTMENT_OTHER): Payer: Medicare Other

## 2017-09-16 VITALS — BP 133/58 | HR 96 | Temp 98.5°F | Resp 20 | Wt 203.7 lb

## 2017-09-16 DIAGNOSIS — C9000 Multiple myeloma not having achieved remission: Secondary | ICD-10-CM | POA: Diagnosis not present

## 2017-09-16 DIAGNOSIS — R000 Tachycardia, unspecified: Secondary | ICD-10-CM | POA: Diagnosis not present

## 2017-09-16 DIAGNOSIS — Z5112 Encounter for antineoplastic immunotherapy: Secondary | ICD-10-CM | POA: Diagnosis present

## 2017-09-16 DIAGNOSIS — Z79899 Other long term (current) drug therapy: Secondary | ICD-10-CM | POA: Diagnosis not present

## 2017-09-16 DIAGNOSIS — D472 Monoclonal gammopathy: Secondary | ICD-10-CM | POA: Diagnosis not present

## 2017-09-16 LAB — CBC WITH DIFFERENTIAL/PLATELET
Basophils Absolute: 0.2 10*3/uL — ABNORMAL HIGH (ref 0.0–0.1)
Basophils Relative: 2 %
EOS PCT: 5 %
Eosinophils Absolute: 0.5 10*3/uL (ref 0.0–0.7)
HEMATOCRIT: 24.2 % — AB (ref 39.0–52.0)
Hemoglobin: 7.9 g/dL — ABNORMAL LOW (ref 13.0–17.0)
LYMPHS PCT: 17 %
Lymphs Abs: 1.8 10*3/uL (ref 0.7–4.0)
MCH: 29.9 pg (ref 26.0–34.0)
MCHC: 32.6 g/dL (ref 30.0–36.0)
MCV: 91.7 fL (ref 78.0–100.0)
MONO ABS: 1.6 10*3/uL — AB (ref 0.1–1.0)
MONOS PCT: 16 %
NEUTROS PCT: 60 %
Neutro Abs: 6.2 10*3/uL (ref 1.7–7.7)
Platelets: 24 10*3/uL — CL (ref 150–400)
RBC: 2.64 MIL/uL — AB (ref 4.22–5.81)
RDW: 16 % — ABNORMAL HIGH (ref 11.5–15.5)
WBC: 10.3 10*3/uL (ref 4.0–10.5)

## 2017-09-16 LAB — COMPREHENSIVE METABOLIC PANEL
ALT: 16 U/L — AB (ref 17–63)
AST: 15 U/L (ref 15–41)
Albumin: 3.9 g/dL (ref 3.5–5.0)
Alkaline Phosphatase: 78 U/L (ref 38–126)
Anion gap: 11 (ref 5–15)
BUN: 58 mg/dL — AB (ref 6–20)
CHLORIDE: 102 mmol/L (ref 101–111)
CO2: 20 mmol/L — ABNORMAL LOW (ref 22–32)
CREATININE: 4.74 mg/dL — AB (ref 0.61–1.24)
Calcium: 8.9 mg/dL (ref 8.9–10.3)
GFR calc Af Amer: 13 mL/min — ABNORMAL LOW (ref >60)
GFR, EST NON AFRICAN AMERICAN: 12 mL/min — AB (ref >60)
Glucose, Bld: 221 mg/dL — ABNORMAL HIGH (ref 65–99)
Potassium: 4.6 mmol/L (ref 3.5–5.1)
Sodium: 133 mmol/L — ABNORMAL LOW (ref 135–145)
Total Bilirubin: 1 mg/dL (ref 0.3–1.2)
Total Protein: 7 g/dL (ref 6.5–8.1)

## 2017-09-16 MED ORDER — BORTEZOMIB CHEMO SQ INJECTION 3.5 MG (2.5MG/ML)
1.3000 mg/m2 | Freq: Once | INTRAMUSCULAR | Status: AC
  Administered 2017-09-16: 2.75 mg via SUBCUTANEOUS
  Filled 2017-09-16: qty 2.75

## 2017-09-16 MED ORDER — PROCHLORPERAZINE MALEATE 10 MG PO TABS
ORAL_TABLET | ORAL | Status: AC
  Filled 2017-09-16: qty 1

## 2017-09-16 MED ORDER — PROCHLORPERAZINE MALEATE 10 MG PO TABS
10.0000 mg | ORAL_TABLET | Freq: Once | ORAL | Status: AC
  Administered 2017-09-16: 10 mg via ORAL

## 2017-09-16 NOTE — Patient Instructions (Signed)
[facility] Discharge Instructions for Patients Receiving Chemotherapy  Today you received the following chemotherapy agents velcade.    If you develop nausea and vomiting that is not controlled by your nausea medication, call the clinic.   BELOW ARE SYMPTOMS THAT SHOULD BE REPORTED IMMEDIATELY:  *FEVER GREATER THAN 100.5 F  *CHILLS WITH OR WITHOUT FEVER  NAUSEA AND VOMITING THAT IS NOT CONTROLLED WITH YOUR NAUSEA MEDICATION  *UNUSUAL SHORTNESS OF BREATH  *UNUSUAL BRUISING OR BLEEDING  TENDERNESS IN MOUTH AND THROAT WITH OR WITHOUT PRESENCE OF ULCERS  *URINARY PROBLEMS  *BOWEL PROBLEMS  UNUSUAL RASH Items with * indicate a potential emergency and should be followed up as soon as possible.  Feel free to call the clinic should you have any questions or concerns. The clinic phone number is [phone].  Please show the CHEMO ALERT CARD at check-in to the Emergency Department and triage nurse.   

## 2017-09-16 NOTE — Progress Notes (Signed)
Patient stated prn tingling in toes and denied falls and stumbling.  Good appetite.  No complaints of pain.   Labs reviewed with DR. Zhou and ok to treat with velcade today.   Patient tolerated velcade injection with no complaints voiced.  Injection site clean and dry with no bruising or swelling noted at site.  Band aid applied.  Reviewed low platelets with the patient and precautions with understanding verbalize.  VSS with discharge and left ambulatory with wife.  No s/s of distress noted.

## 2017-09-16 NOTE — Progress Notes (Signed)
CRITICAL VALUE ALERT Critical value received:  Platelets 24,000 Date of notification:  09-16-2017 Time of notification: 1100 Critical value read back:  Yes.   Nurse who received alert: C.Karstyn Birkey RN MD notified (1st Bartlomiej Jenkinson):  Dr. Talbert Cage

## 2017-09-23 ENCOUNTER — Other Ambulatory Visit (HOSPITAL_COMMUNITY)
Admission: RE | Admit: 2017-09-23 | Discharge: 2017-09-23 | Disposition: A | Discharge status: Home / Self Care | Payer: Medicare Other | Source: Ambulatory Visit | Attending: Nephrology | Admitting: Nephrology

## 2017-09-23 ENCOUNTER — Encounter (HOSPITAL_BASED_OUTPATIENT_CLINIC_OR_DEPARTMENT_OTHER): Payer: Medicare Other

## 2017-09-23 ENCOUNTER — Encounter (HOSPITAL_COMMUNITY): Payer: Medicare Other

## 2017-09-23 ENCOUNTER — Encounter (HOSPITAL_COMMUNITY): Payer: Self-pay

## 2017-09-23 VITALS — BP 130/60 | HR 103 | Temp 97.5°F | Resp 20 | Wt 205.6 lb

## 2017-09-23 DIAGNOSIS — Z5112 Encounter for antineoplastic immunotherapy: Secondary | ICD-10-CM | POA: Diagnosis present

## 2017-09-23 DIAGNOSIS — I10 Essential (primary) hypertension: Secondary | ICD-10-CM | POA: Insufficient documentation

## 2017-09-23 DIAGNOSIS — Z79899 Other long term (current) drug therapy: Secondary | ICD-10-CM | POA: Diagnosis not present

## 2017-09-23 DIAGNOSIS — R809 Proteinuria, unspecified: Secondary | ICD-10-CM | POA: Diagnosis not present

## 2017-09-23 DIAGNOSIS — N183 Chronic kidney disease, stage 3 (moderate): Secondary | ICD-10-CM | POA: Insufficient documentation

## 2017-09-23 DIAGNOSIS — D472 Monoclonal gammopathy: Secondary | ICD-10-CM | POA: Diagnosis not present

## 2017-09-23 DIAGNOSIS — C9000 Multiple myeloma not having achieved remission: Secondary | ICD-10-CM | POA: Diagnosis not present

## 2017-09-23 DIAGNOSIS — D509 Iron deficiency anemia, unspecified: Secondary | ICD-10-CM | POA: Insufficient documentation

## 2017-09-23 DIAGNOSIS — R000 Tachycardia, unspecified: Secondary | ICD-10-CM | POA: Diagnosis not present

## 2017-09-23 LAB — RENAL FUNCTION PANEL
ALBUMIN: 3.7 g/dL (ref 3.5–5.0)
Anion gap: 10 (ref 5–15)
BUN: 64 mg/dL — AB (ref 6–20)
CO2: 18 mmol/L — ABNORMAL LOW (ref 22–32)
CREATININE: 4.32 mg/dL — AB (ref 0.61–1.24)
Calcium: 8.2 mg/dL — ABNORMAL LOW (ref 8.9–10.3)
Chloride: 101 mmol/L (ref 101–111)
GFR calc Af Amer: 15 mL/min — ABNORMAL LOW (ref >60)
GFR calc non Af Amer: 13 mL/min — ABNORMAL LOW (ref >60)
GLUCOSE: 222 mg/dL — AB (ref 65–99)
PHOSPHORUS: 5 mg/dL — AB (ref 2.5–4.6)
POTASSIUM: 5.1 mmol/L (ref 3.5–5.1)
Sodium: 129 mmol/L — ABNORMAL LOW (ref 135–145)

## 2017-09-23 LAB — COMPREHENSIVE METABOLIC PANEL
ALT: 18 U/L (ref 17–63)
ANION GAP: 11 (ref 5–15)
AST: 12 U/L — ABNORMAL LOW (ref 15–41)
Albumin: 3.7 g/dL (ref 3.5–5.0)
Alkaline Phosphatase: 70 U/L (ref 38–126)
BILIRUBIN TOTAL: 0.9 mg/dL (ref 0.3–1.2)
BUN: 63 mg/dL — ABNORMAL HIGH (ref 6–20)
CALCIUM: 8.1 mg/dL — AB (ref 8.9–10.3)
CO2: 18 mmol/L — AB (ref 22–32)
CREATININE: 4.26 mg/dL — AB (ref 0.61–1.24)
Chloride: 100 mmol/L — ABNORMAL LOW (ref 101–111)
GFR calc non Af Amer: 13 mL/min — ABNORMAL LOW (ref >60)
GFR, EST AFRICAN AMERICAN: 15 mL/min — AB (ref >60)
Glucose, Bld: 219 mg/dL — ABNORMAL HIGH (ref 65–99)
Potassium: 5.1 mmol/L (ref 3.5–5.1)
SODIUM: 129 mmol/L — AB (ref 135–145)
Total Protein: 6.5 g/dL (ref 6.5–8.1)

## 2017-09-23 LAB — CBC WITH DIFFERENTIAL/PLATELET
Basophils Absolute: 0 10*3/uL (ref 0.0–0.1)
Basophils Relative: 1 %
Eosinophils Absolute: 0.3 10*3/uL (ref 0.0–0.7)
Eosinophils Relative: 7 %
HCT: 19.8 % — ABNORMAL LOW (ref 39.0–52.0)
HEMOGLOBIN: 6.5 g/dL — AB (ref 13.0–17.0)
LYMPHS ABS: 0.7 10*3/uL (ref 0.7–4.0)
LYMPHS PCT: 17 %
MCH: 30 pg (ref 26.0–34.0)
MCHC: 32.8 g/dL (ref 30.0–36.0)
MCV: 91.2 fL (ref 78.0–100.0)
MONOS PCT: 20 %
Monocytes Absolute: 0.8 10*3/uL (ref 0.1–1.0)
NEUTROS PCT: 56 %
Neutro Abs: 2.2 10*3/uL (ref 1.7–7.7)
Platelets: 35 10*3/uL — ABNORMAL LOW (ref 150–400)
RBC: 2.17 MIL/uL — AB (ref 4.22–5.81)
RDW: 16 % — ABNORMAL HIGH (ref 11.5–15.5)
WBC: 4 10*3/uL (ref 4.0–10.5)

## 2017-09-23 LAB — IRON AND TIBC
Iron: 160 ug/dL (ref 45–182)
Saturation Ratios: 61 % — ABNORMAL HIGH (ref 17.9–39.5)
TIBC: 260 ug/dL (ref 250–450)
UIBC: 100 ug/dL

## 2017-09-23 LAB — PROTEIN / CREATININE RATIO, URINE
CREATININE, URINE: 65.29 mg/dL
PROTEIN CREATININE RATIO: 2.08 mg/mg{creat} — AB (ref 0.00–0.15)
TOTAL PROTEIN, URINE: 136 mg/dL

## 2017-09-23 LAB — FERRITIN: Ferritin: 1080 ng/mL — ABNORMAL HIGH (ref 24–336)

## 2017-09-23 LAB — PREPARE RBC (CROSSMATCH)

## 2017-09-23 MED ORDER — BORTEZOMIB CHEMO SQ INJECTION 3.5 MG (2.5MG/ML)
1.3000 mg/m2 | Freq: Once | INTRAMUSCULAR | Status: AC
  Administered 2017-09-23: 2.75 mg via SUBCUTANEOUS
  Filled 2017-09-23: qty 2.75

## 2017-09-23 MED ORDER — LENALIDOMIDE 25 MG PO CAPS: 25.0000 mg | ORAL_CAPSULE | Freq: Every day | ORAL | 3 refills | Status: DC

## 2017-09-23 MED ORDER — DEXAMETHASONE 4 MG PO TABS: ORAL_TABLET | ORAL | 3 refills | Status: DC

## 2017-09-23 MED ORDER — PROCHLORPERAZINE MALEATE 10 MG PO TABS
10.0000 mg | ORAL_TABLET | Freq: Once | ORAL | Status: AC
  Administered 2017-09-23: 10 mg via ORAL
  Filled 2017-09-23: qty 1

## 2017-09-23 NOTE — Patient Instructions (Signed)
[facility] Discharge Instructions for Patients Receiving Chemotherapy  Today you received the following chemotherapy agents velcade.    If you develop nausea and vomiting that is not controlled by your nausea medication, call the clinic.   BELOW ARE SYMPTOMS THAT SHOULD BE REPORTED IMMEDIATELY:  *FEVER GREATER THAN 100.5 F  *CHILLS WITH OR WITHOUT FEVER  NAUSEA AND VOMITING THAT IS NOT CONTROLLED WITH YOUR NAUSEA MEDICATION  *UNUSUAL SHORTNESS OF BREATH  *UNUSUAL BRUISING OR BLEEDING  TENDERNESS IN MOUTH AND THROAT WITH OR WITHOUT PRESENCE OF ULCERS  *URINARY PROBLEMS  *BOWEL PROBLEMS  UNUSUAL RASH Items with * indicate a potential emergency and should be followed up as soon as possible.  Feel free to call the clinic should you have any questions or concerns. The clinic phone number is [phone].  Please show the CHEMO ALERT CARD at check-in to the Emergency Department and triage nurse.   

## 2017-09-23 NOTE — Progress Notes (Signed)
Labs reviewed with DR. Zhou hgb 6.5 today and ok to treat with velcade.  Patient is to receive 2 units of blood tomorrow.  Patient stated fatigue and SOB.  Instructed on times for tomorrows transfusion with understanding verbalized.    Abdomen swollen and noted more firm from last week.  Dr. Talbert Cage notified and will examine tomorrow with blood transfusion.    Patient tolerated velcade with no complaints voiced.  Band aid applied.  VSS with discharge and left ambulatory with wife.  No s/ s of distress noted.

## 2017-09-23 NOTE — Progress Notes (Unsigned)
CRITICAL VALUE ALERT Critical value received:  hgb 6.5 Date of notification:  09/23/17 Time of notification: 9409 Critical value read back:  Yes.   Nurse who received alert:  Isidoro Donning RN Dr. Talbert Cage notified. 2 units of irradiated prbc ordered and I called blood bank to be sure they got the order. Patient notified by Maralyn Sago that he needs to come tomorrow for 2 units of blood.

## 2017-09-24 ENCOUNTER — Encounter (HOSPITAL_COMMUNITY): Payer: Self-pay

## 2017-09-24 ENCOUNTER — Other Ambulatory Visit: Payer: Self-pay

## 2017-09-24 ENCOUNTER — Encounter (HOSPITAL_COMMUNITY): Payer: Medicare Other

## 2017-09-24 VITALS — BP 111/54 | HR 110 | Temp 98.5°F | Resp 20 | Wt 206.9 lb

## 2017-09-24 DIAGNOSIS — R140 Abdominal distension (gaseous): Secondary | ICD-10-CM

## 2017-09-24 DIAGNOSIS — C9000 Multiple myeloma not having achieved remission: Secondary | ICD-10-CM | POA: Diagnosis not present

## 2017-09-24 DIAGNOSIS — D472 Monoclonal gammopathy: Secondary | ICD-10-CM | POA: Diagnosis not present

## 2017-09-24 DIAGNOSIS — R000 Tachycardia, unspecified: Secondary | ICD-10-CM

## 2017-09-24 DIAGNOSIS — Z79899 Other long term (current) drug therapy: Secondary | ICD-10-CM | POA: Diagnosis not present

## 2017-09-24 LAB — VITAMIN D 25 HYDROXY (VIT D DEFICIENCY, FRACTURES): Vit D, 25-Hydroxy: 35.1 ng/mL (ref 30.0–100.0)

## 2017-09-24 LAB — PARATHYROID HORMONE, INTACT (NO CA): PTH: 143 pg/mL — AB (ref 15–65)

## 2017-09-24 MED ORDER — DIPHENHYDRAMINE HCL 25 MG PO CAPS
25.0000 mg | ORAL_CAPSULE | Freq: Once | ORAL | Status: AC
  Administered 2017-09-24: 25 mg via ORAL
  Filled 2017-09-24: qty 1

## 2017-09-24 MED ORDER — ACETAMINOPHEN 325 MG PO TABS
650.0000 mg | ORAL_TABLET | Freq: Once | ORAL | Status: AC
  Administered 2017-09-24: 650 mg via ORAL
  Filled 2017-09-24: qty 2

## 2017-09-24 MED ORDER — SODIUM CHLORIDE 0.9 % IV SOLN: 250.0000 mL | Freq: Once | INTRAVENOUS | Status: AC

## 2017-09-24 NOTE — Progress Notes (Unsigned)
ECG obtained and shown to Dr. Talbert Cage.  Okay to proceed with blood transfusion today per MD.  Pt denies SOB, chest pain, dizziness.    Tolerated transfusion w/o adverse reaction.  Alert, in no distress.  VSS.  Discharged ambulatory in c/o family.

## 2017-09-25 DIAGNOSIS — E872 Acidosis: Secondary | ICD-10-CM | POA: Diagnosis not present

## 2017-09-25 DIAGNOSIS — R809 Proteinuria, unspecified: Secondary | ICD-10-CM | POA: Diagnosis not present

## 2017-09-25 DIAGNOSIS — N185 Chronic kidney disease, stage 5: Secondary | ICD-10-CM | POA: Diagnosis not present

## 2017-09-25 DIAGNOSIS — N2581 Secondary hyperparathyroidism of renal origin: Secondary | ICD-10-CM | POA: Diagnosis not present

## 2017-09-25 LAB — TYPE AND SCREEN
ABO/RH(D): A POS
ANTIBODY SCREEN: NEGATIVE
Unit division: 0
Unit division: 0

## 2017-09-25 LAB — BPAM RBC
BLOOD PRODUCT EXPIRATION DATE: 201812052359
BLOOD PRODUCT EXPIRATION DATE: 201812102359
ISSUE DATE / TIME: 201811270936
ISSUE DATE / TIME: 201811271127
UNIT TYPE AND RH: 5100
Unit Type and Rh: 5100

## 2017-09-27 ENCOUNTER — Ambulatory Visit (HOSPITAL_COMMUNITY)
Admission: RE | Admit: 2017-09-27 | Discharge: 2017-09-27 | Disposition: A | Discharge status: Home / Self Care | Payer: Medicare Other | Source: Ambulatory Visit | Attending: Oncology | Admitting: Oncology

## 2017-09-27 DIAGNOSIS — N281 Cyst of kidney, acquired: Secondary | ICD-10-CM | POA: Insufficient documentation

## 2017-09-27 DIAGNOSIS — R140 Abdominal distension (gaseous): Secondary | ICD-10-CM | POA: Diagnosis not present

## 2017-09-27 DIAGNOSIS — Z9049 Acquired absence of other specified parts of digestive tract: Secondary | ICD-10-CM | POA: Diagnosis not present

## 2017-09-27 DIAGNOSIS — Z905 Acquired absence of kidney: Secondary | ICD-10-CM | POA: Diagnosis not present

## 2017-09-27 DIAGNOSIS — R05 Cough: Secondary | ICD-10-CM | POA: Diagnosis not present

## 2017-09-27 DIAGNOSIS — C9000 Multiple myeloma not having achieved remission: Secondary | ICD-10-CM | POA: Diagnosis not present

## 2017-09-30 ENCOUNTER — Encounter (HOSPITAL_COMMUNITY): Payer: Self-pay | Admitting: Oncology

## 2017-09-30 ENCOUNTER — Encounter (HOSPITAL_COMMUNITY): Payer: Medicare Other | Attending: Oncology

## 2017-09-30 ENCOUNTER — Other Ambulatory Visit: Payer: Self-pay

## 2017-09-30 ENCOUNTER — Encounter (HOSPITAL_BASED_OUTPATIENT_CLINIC_OR_DEPARTMENT_OTHER): Payer: Medicare Other | Admitting: Oncology

## 2017-09-30 ENCOUNTER — Encounter (HOSPITAL_COMMUNITY): Payer: Medicare Other

## 2017-09-30 VITALS — BP 126/60 | HR 101 | Temp 98.9°F | Resp 18 | Wt 206.1 lb

## 2017-09-30 DIAGNOSIS — D630 Anemia in neoplastic disease: Secondary | ICD-10-CM | POA: Diagnosis not present

## 2017-09-30 DIAGNOSIS — D472 Monoclonal gammopathy: Secondary | ICD-10-CM | POA: Insufficient documentation

## 2017-09-30 DIAGNOSIS — Z79899 Other long term (current) drug therapy: Secondary | ICD-10-CM | POA: Insufficient documentation

## 2017-09-30 DIAGNOSIS — C9000 Multiple myeloma not having achieved remission: Secondary | ICD-10-CM

## 2017-09-30 DIAGNOSIS — R000 Tachycardia, unspecified: Secondary | ICD-10-CM | POA: Diagnosis not present

## 2017-09-30 DIAGNOSIS — D6959 Other secondary thrombocytopenia: Secondary | ICD-10-CM | POA: Diagnosis not present

## 2017-09-30 DIAGNOSIS — Z5112 Encounter for antineoplastic immunotherapy: Secondary | ICD-10-CM

## 2017-09-30 DIAGNOSIS — N19 Unspecified kidney failure: Secondary | ICD-10-CM | POA: Diagnosis not present

## 2017-09-30 DIAGNOSIS — M898X9 Other specified disorders of bone, unspecified site: Secondary | ICD-10-CM

## 2017-09-30 LAB — CBC WITH DIFFERENTIAL/PLATELET
BASOS ABS: 0 10*3/uL (ref 0.0–0.1)
BASOS PCT: 0 %
EOS ABS: 0.1 10*3/uL (ref 0.0–0.7)
EOS PCT: 2 %
HCT: 20.9 % — ABNORMAL LOW (ref 39.0–52.0)
HEMOGLOBIN: 7 g/dL — AB (ref 13.0–17.0)
Lymphocytes Relative: 11 %
Lymphs Abs: 0.4 10*3/uL — ABNORMAL LOW (ref 0.7–4.0)
MCH: 30.4 pg (ref 26.0–34.0)
MCHC: 33.5 g/dL (ref 30.0–36.0)
MCV: 90.9 fL (ref 78.0–100.0)
Monocytes Absolute: 0.2 10*3/uL (ref 0.1–1.0)
Monocytes Relative: 5 %
NEUTROS PCT: 82 %
Neutro Abs: 3 10*3/uL (ref 1.7–7.7)
PLATELETS: 36 10*3/uL — AB (ref 150–400)
RBC: 2.3 MIL/uL — AB (ref 4.22–5.81)
RDW: 15.6 % — ABNORMAL HIGH (ref 11.5–15.5)
WBC: 3.7 10*3/uL — AB (ref 4.0–10.5)

## 2017-09-30 LAB — COMPREHENSIVE METABOLIC PANEL
ALT: 22 U/L (ref 17–63)
ANION GAP: 12 (ref 5–15)
AST: 14 U/L — ABNORMAL LOW (ref 15–41)
Albumin: 3.4 g/dL — ABNORMAL LOW (ref 3.5–5.0)
Alkaline Phosphatase: 99 U/L (ref 38–126)
BUN: 60 mg/dL — ABNORMAL HIGH (ref 6–20)
CHLORIDE: 100 mmol/L — AB (ref 101–111)
CO2: 19 mmol/L — AB (ref 22–32)
CREATININE: 4.16 mg/dL — AB (ref 0.61–1.24)
Calcium: 8.3 mg/dL — ABNORMAL LOW (ref 8.9–10.3)
GFR, EST AFRICAN AMERICAN: 16 mL/min — AB (ref >60)
GFR, EST NON AFRICAN AMERICAN: 14 mL/min — AB (ref >60)
Glucose, Bld: 192 mg/dL — ABNORMAL HIGH (ref 65–99)
Potassium: 4.8 mmol/L (ref 3.5–5.1)
SODIUM: 131 mmol/L — AB (ref 135–145)
Total Bilirubin: 0.5 mg/dL (ref 0.3–1.2)
Total Protein: 6 g/dL — ABNORMAL LOW (ref 6.5–8.1)

## 2017-09-30 MED ORDER — PROCHLORPERAZINE MALEATE 10 MG PO TABS
ORAL_TABLET | ORAL | Status: AC
  Filled 2017-09-30: qty 1

## 2017-09-30 MED ORDER — PROCHLORPERAZINE MALEATE 10 MG PO TABS
10.0000 mg | ORAL_TABLET | Freq: Once | ORAL | Status: AC
  Administered 2017-09-30: 10 mg via ORAL

## 2017-09-30 MED ORDER — BORTEZOMIB CHEMO SQ INJECTION 3.5 MG (2.5MG/ML)
1.3000 mg/m2 | Freq: Once | INTRAMUSCULAR | Status: AC
  Administered 2017-09-30: 2.75 mg via SUBCUTANEOUS
  Filled 2017-09-30: qty 2.75

## 2017-09-30 NOTE — Progress Notes (Signed)
Patient is taking Revlimid and is on day one of new cycle and has not missed any doses and reports no side effects at this time.

## 2017-09-30 NOTE — Progress Notes (Signed)
Platelets 36 reviewed with Dr. Talbert Cage and ok to treat today. PRN tingling in fingers.  No pain.      Patient tolerated velcade shot with no complaints voiced.  Injection site clean and dry with no bruising or swelling noted at site.  Band aid applied.  VSS with discharge and left ambulatory with family.  No s/s of distress noted.

## 2017-09-30 NOTE — Patient Instructions (Addendum)
[place] Marshall at [facility] Discharge Instructions  RECOMMENDATIONS MADE BY THE CONSULTANT AND ANY TEST RESULTS WILL BE SENT TO YOUR REFERRING PHYSICIAN.  You were seen today by Dr. Forest Gleason Follow up in 3 weeks with next cycle with lab work  Thank you for choosing [name] at [facility] to provide your oncology and hematology care.  To afford each patient quality time with our provider, please arrive at least 15 minutes before your scheduled appointment time.    If you have a lab appointment with the [facility] please come in thru the  Main Entrance and check in at the main information desk  You need to re-schedule your appointment should you arrive 10 or more minutes late.  We strive to give you quality time with our providers, and arriving late affects you and other patients whose appointments are after yours.  Also, if you no show three or more times for appointments you may be dismissed from the clinic at the providers discretion.     Again, thank you for choosing [facility].  Our hope is that these requests will decrease the amount of time that you wait before being seen by our physicians.       _____________________________________________________________  Should you have questions after your visit to [facility], please contact our office at [phone] between the hours of 8:30 a.m. and 4:30 p.m.  Voicemails left after 4:30 p.m. will not be returned until the following business day.  For prescription refill requests, have your pharmacy contact our office.       Resources For Cancer Patients and their Caregivers ? American Cancer Society: Can assist with transportation, wigs, general needs, runs Look Good Feel Better.        (430) 747-3645 ? Cancer Care: Provides financial assistance, online support groups, medication/co-pay assistance.  1-800-813-HOPE 509-170-5805) ? [facility] Assists [place] cancer patients and their families through emotional , educational and financial support.  (406)334-7191 ? [place] DSS Where to apply for food stamps, Medicaid and utility assistance. (301) 858-1747 ? [facility]: Transportation to medical appointments. 435-764-6364 ? Social Security Administration: May apply for disability if have a Stage IV cancer. 907-175-0305 740-379-3941 ? [employer], Disability and Transit Services: Assists with nutrition, care and transit needs. [place] Support Programs: @10RELATIVEDAYS @ > Cancer Support Group  2nd Tuesday of the month 1pm-2pm, Journey Room  > Creative Journey  3rd Tuesday of the month 1130am-1pm, Journey Room  > Look Good Feel Better  1st Wednesday of the month 10am-12 noon, Journey Room (Call [facility] to register [phone])

## 2017-09-30 NOTE — Patient Instructions (Signed)
[place] at [facility]  Discharge Instructions:  You received a velcade shot today.  _______________________________________________________________  Thank you for choosing [place] at [facility] to provide your oncology and hematology care.  To afford each patient quality time with our providers, please arrive at least 15 minutes before your scheduled appointment.  You need to re-schedule your appointment if you arrive 10 or more minutes late.  We strive to give you quality time with our providers, and arriving late affects you and other patients whose appointments are after yours.  Also, if you no show three or more times for appointments you may be dismissed from the clinic.  Again, thank you for choosing [place] at [place] hope is that these requests will allow you access to exceptional care and in a timely manner. _______________________________________________________________  If you have questions after your visit, please contact our office at [phone] between the hours of 8:30 a.m. and 5:00 p.m. Voicemails left after 4:30 p.m. will not be returned until the following business day. _______________________________________________________________  For prescription refill requests, have your pharmacy contact our office. _______________________________________________________________  Recommendations made by the consultant and any test results will be sent to your referring physician. _______________________________________________________________

## 2017-09-30 NOTE — Progress Notes (Signed)
[place], [place] [address]   CLINIC:  Medical Oncology/Hematology  PCP:  Asencion Noble, MD [address] [place] [address] [phone]   REASON FOR VISIT:  Follow-up for IgA kappa multiple myeloma with >95% plasma cells in bone marrow  CURRENT THERAPY: Revlimid/Decadron/Velcade, beginning 09/09/17    HISTORY OF PRESENT ILLNESS:  (from Dr. Laverle Patter last note on 08/29/17)  Tom Marshall 66 y.o. 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 Patient was initially admitted to [facility] from 07/23/2017 through 07/31/2017 with a 10 day history of lower back pain, fatigue, pallor. CT of the abdomen and pelvis in the emergency department revealed Interval increase in size of a large cyst the inferior pole of the left kidney 10.4 cm greatest size, and a number of questionable lytic lesions are identified within thoracolumbar vertebra, subtle lytic metastases not excluded. Fracture identified at the left transverse process of L3, new since 2010.The patient was also noted to be in acute kidney injury with serum creatinine of 7.34, calcium 11.7. (baseline creatinine 0.8-1.0 from July 2018) CBC on 07/23/2017 demonstrated WBC 8.2K, hemoglobin 7.9 g/dL, hematocrit 23%, platelet count 56K. SPEP 07/24/17 demonstrated two peaks in the beta-gamma region which may represent monoclonal protein. Immunofixation shows IgA monoclonal protein with kappa light chain specificity. Free kappa light chains 14,886.4, lambda light chains 10.2, free kappa/lambda light chain ratio 1459.45. Immunoglobulins demonstrated IgA 788, IgG 261. Urine IFE demonstrated Bence Jones Protein positive; kappa type. He received pamidronate on 07/26/17 with improvement in his hypercalcemia. At the time of discharge on 07/31/2017 CBC demonstrated WBC 7.3K, hemoglobin 7.9 g/dL, platelet count 28K. Serum iron levels were normal, ferritin was elevated at 911. At the time of  discharge his creatinine was 5.98, calcium 8.3.     INTERVAL HISTORY:  Tom Marshall 66 y.o. male returns for follow-up for multiple myeloma.   Here for cycle #1, day 4 of RVD therapy for recently diagnosed multiple myeloma.   Since his last visit, he was hospitalized overnight d/t severe anemia and thrombocytopenia. He received 3 units PRBCs and 1 unit platelets during hospitalization.  He was discharged on 08/30/17 with Hgb 8.2 g/dL and Plts 51,000 at that time.   Overall, he tells me he is feeling pretty well. Appetite and energy levels both 75%.  He feels a little weak at times.  He is taking the Revlimid and Decadron as prescribed; "I'm not having any side effects that I know of."  Denies diarrhea or rash.  Denies any shortness of breath, severe fatigue, or frank bleeding episodes. Denies any blood in his stools, hematuria, or nosebleeds.   He does report bilateral shoulder pain, with his left shoulder/scapula being worse than the right.  "I couldn't sleep last night because I couldn't find a comfortable position because of the pain."    He shares with me that he thinks he is supposed to see his nephrologist, Dr. Hinda Lenis soon regarding his declining kidney function.    September 30, 2017 Patient is here for further follow-up.  Had ultrasound of her abdomen which did not reveal any abnormality.  Patient is being followed by nephrologist for renal failure. Patient is severely anemic as well as has thrombocytopenia most likely secondary to myelomatous involvement of bone marrow  Here for further follow-up and continuation of treatment.  Starting second cycle of chemotherapy    REVIEW OF SYSTEMS:  Review of Systems  Constitutional: Positive for fatigue.  HENT:  Negative.  Negative for nosebleeds.   Eyes: Negative.   Respiratory: Negative.  Negative for shortness of breath.   Cardiovascular: Negative.   Gastrointestinal: Negative.  Negative for constipation, diarrhea, nausea and vomiting.    Endocrine: Negative.   Genitourinary: Negative.    Musculoskeletal: Positive for arthralgias.  Skin: Negative.  Negative for rash.  Neurological: Negative.   Hematological: Negative.   Psychiatric/Behavioral: Positive for sleep disturbance.   Swelling of lower extremity  PAST MEDICAL/SURGICAL HISTORY:  Past Medical History:  Diagnosis Date  . Cancer ([place])    multiple myeloma  . Cancer of right kidney ([employer])   . Cervical dystonia   . Diabetes mellitus without complication [facility])    Past Surgical History:  Procedure Laterality Date  . CHOLECYSTECTOMY  2007  . NEPHRECTOMY Right 1998   cancer     SOCIAL HISTORY:  Social History   Socioeconomic History  . Marital status: Single    Spouse name: Not on file  . Number of children: Not on file  . Years of education: Not on file  . Highest education level: Not on file  Social Needs  . Financial resource strain: Not on file  . Food insecurity - worry: Not on file  . Food insecurity - inability: Not on file  . Transportation needs - medical: Not on file  . Transportation needs - non-medical: Not on file  Occupational History  . Occupation: Marine scientist, Insurance underwriter  Tobacco Use  . Smoking status: Never Smoker  . Smokeless tobacco: Current User    Types: Chew  Substance and Sexual Activity  . Alcohol use: No  . Drug use: No  . Sexual activity: Not on file  Other Topics Concern  . Not on file  Social History Narrative  . Not on file    FAMILY HISTORY:  Family History  Problem Relation Age of Onset  . Heart failure Mother 84  . Dementia Father     CURRENT MEDICATIONS:  Outpatient Encounter Medications as of 09/30/2017  Medication Sig  . acyclovir (ZOVIRAX) 400 MG tablet Take 1 tablet (400 mg total) 2 (two) times daily by mouth.  [name] allopurinol (ZYLOPRIM) 300 MG tablet Take 300 mg by mouth every morning.  [name] aspirin EC 81 MG tablet Take 81 mg daily by mouth.  . bortezomib IV (VELCADE) 3.5 MG injection Inject once  into the vein. Days 1,4,8,11 every 21 days  . cyclobenzaprine (FLEXERIL) 10 MG tablet Take 10 mg by mouth at bedtime.  [name] dexamethasone (DECADRON) 4 MG tablet Take 10 tablets (40 mg) on days 1, 8, and 15 of chemo. Repeat every 21 days.  [name] doxycycline (VIBRA-TABS) 100 MG tablet Take 1 tablet by mouth 2 (two) times daily.  [name] glipiZIDE (GLIPIZIDE XL) 2.5 MG 24 hr tablet Take 1 tablet (2.5 mg total) by mouth daily with breakfast.  . Ibuprofen-Diphenhydramine Cit (ADVIL PM PO) Take by mouth.  . lenalidomide (REVLIMID) 25 MG capsule Take 1 capsule (25 mg total) by mouth daily. Take 1 capsule on days 1-14. Repeat every 21 days.  . Multiple Vitamins-Minerals (CENTRUM SILVER 50+MEN) TABS Take 1 tablet by mouth every morning.  . pravastatin (PRAVACHOL) 20 MG tablet Take 20 mg by mouth every morning.  . sodium bicarbonate 650 MG tablet Take 1 tablet (650 mg total) by mouth 2 (two) times daily. (Patient taking differently: Take 650 mg by mouth 3 (three) times daily. )  . traMADol (ULTRAM) 50 MG tablet Take 1 tablet (50 mg total) every 6 (six) hours as  needed by mouth.   Facility-Administered Encounter Medications as of 09/30/2017  Medication  . 0.9 %  sodium chloride infusion    ALLERGIES:  No Known Allergies   PHYSICAL EXAM:  ECOG Performance status: 1 - Symptomatic; remains independent   Vitals:   09/30/17 1343  BP: 126/60  Pulse: (!) 101  Resp: 18  Temp: 98.9 F (37.2 C)  SpO2: 97%   Filed Weights   09/30/17 1343  Weight: 206 lb 1.6 oz (93.5 kg)    Physical Exam  Constitutional: He is oriented to person, place, and time and well-developed, well-nourished, and in no distress.  HENT:  Head: Normocephalic.  Mouth/Throat: Oropharynx is clear and moist. No oropharyngeal exudate.  Eyes: Conjunctivae are normal. Pupils are equal, round, and reactive to light. No scleral icterus.  Neck: Normal range of motion. Neck supple.  Cardiovascular: Regular rhythm.  Tachycardic  Pulmonary/Chest:  Effort normal and breath sounds normal. No respiratory distress. He has no wheezes.  Abdominal: Soft. Bowel sounds are normal. He exhibits distension. There is no tenderness.  Musculoskeletal: Normal range of motion. He exhibits edema (2+ edema).       Right shoulder: He exhibits no swelling.  Lymphadenopathy:    He has no cervical adenopathy.       Right: No supraclavicular adenopathy present.       Left: No supraclavicular adenopathy present.  Neurological: He is alert and oriented to person, place, and time. No cranial nerve deficit.  Skin: Skin is warm and dry. No rash noted.  Swelling of lower extremity 2+  Psychiatric: Mood, memory, affect and judgment normal.  Nursing note and vitals reviewed.    LABORATORY DATA:  I have reviewed the labs as listed.  CBC    Component Value Date/Time   WBC 3.7 (L) 09/30/2017 1258   RBC 2.30 (L) 09/30/2017 1258   HGB 7.0 (L) 09/30/2017 1258   HCT 20.9 (L) 09/30/2017 1258   PLT 36 (L) 09/30/2017 1258   MCV 90.9 09/30/2017 1258   MCH 30.4 09/30/2017 1258   MCHC 33.5 09/30/2017 1258   RDW 15.6 (H) 09/30/2017 1258   LYMPHSABS 0.4 (L) 09/30/2017 1258   MONOABS 0.2 09/30/2017 1258   EOSABS 0.1 09/30/2017 1258   BASOSABS 0.0 09/30/2017 1258   CMP Latest Ref Rng & Units 09/23/2017 09/23/2017 09/16/2017  Glucose 65 - 99 mg/dL 222(H) 219(H) 221(H)  BUN 6 - 20 mg/dL 64(H) 63(H) 58(H)  Creatinine 0.61 - 1.24 mg/dL 4.32(H) 4.26(H) 4.74(H)  Sodium 135 - 145 mmol/L 129(L) 129(L) 133(L)  Potassium 3.5 - 5.1 mmol/L 5.1 5.1 4.6  Chloride 101 - 111 mmol/L 101 100(L) 102  CO2 22 - 32 mmol/L 18(L) 18(L) 20(L)  Calcium 8.9 - 10.3 mg/dL 8.2(L) 8.1(L) 8.9  Total Protein 6.5 - 8.1 g/dL - 6.5 7.0  Total Bilirubin 0.3 - 1.2 mg/dL - 0.9 1.0  Alkaline Phos 38 - 126 U/L - 70 78  AST 15 - 41 U/L - 12(L) 15  ALT 17 - 63 U/L - 18 16(L)   Results for Tom, Marshall (MRN [id number])   Ref. Range 09/09/2017 13:14  Total Protein ELP Latest Ref Range: 6.0 - 8.5  g/dL 7.7  Albumin ELP Latest Ref Range: 2.9 - 4.4 g/dL 3.8  Globulin, Total Latest Ref Range: 2.2 - 3.9 g/dL 3.9  A/G Ratio Latest Ref Range: 0.7 - 1.7  1.0  Alpha-1-Globulin Latest Ref Range: 0.0 - 0.4 g/dL 0.2  Alpha-2-Globulin Latest Ref Range: 0.4 - 1.0 g/dL 1.0  Beta Globulin Latest Ref Range: 0.7 - 1.3 g/dL 1.1  Gamma Globulin Latest Ref Range: 0.4 - 1.8 g/dL 1.5  M-SPIKE, % Latest Ref Range: Not Observed g/dL 0.9 (H)  SPE Interp. Unknown Comment  Comment Unknown Comment  IgG (Immunoglobin G), Serum Latest Ref Range: 700 - 1,600 mg/dL 316 (L)  IgA Latest Ref Range: 61 - 437 mg/dL 1,103 (H)  IgM (Immunoglobulin M), Srm Latest Ref Range: 20 - 172 mg/dL 19 (L)  Kappa free light chain Latest Ref Range: 3.3 - 19.4 mg/L 17,868.2 (H)  Lamda free light chains Latest Ref Range: 5.7 - 26.3 mg/L 8.6  Kappa, lamda light chain ratio Latest Ref Range: 0.26 - 1.65  2,077.70 (H)       PENDING LABS:    DIAGNOSTIC IMAGING:  *The following radiologic images and reports have been reviewed independently and agree with below findings.  PET scan: 08/13/17 CLINICAL DATA:  Initial treatment strategy for staging of multiple myeloma.  EXAM: NUCLEAR MEDICINE PET WHOLE BODY  TECHNIQUE: 10.7 mCi F-18 FDG was injected intravenously. Full-ring PET imaging was performed from the vertex to the feet after the radiotracer. CT data was obtained and used for attenuation correction and anatomic localization.  FASTING BLOOD GLUCOSE:  Value: 148 mg/dl  COMPARISON:  Skeletal survey of 08/02/2017. Abdominopelvic CT 07/23/2017. Chest CT 01/25/2009.  FINDINGS: HEAD/NECK  No areas of abnormal hypermetabolism.  No cervical adenopathy.  CHEST  No pulmonary parenchymal or thoracic nodal hypermetabolism identified. Mild bilateral gynecomastia. Mild cardiomegaly with multivessel coronary artery atherosclerosis. No thoracic adenopathy. Bibasilar scarring.  ABDOMEN/PELVIS  No abdominopelvic  nodal or parenchymal hypermetabolism. Right nephrectomy. Normal adrenal glands. Abdominal aortic atherosclerosis. Caudate enlargement and medial segment left liver lobe atrophy. Cholecystectomy. Lower pole left renal dominant 9.8 cm low-density lesion is most consistent with a cyst. Fat containing left larger than right inguinal hernias. Mild prostatomegaly.  SKELETON  Relatively diffuse heterogeneous marrow hypermetabolism. Example focus within the sternum measures a S.U.V. max of 6.7. Bilateral femoral shaft foci of hypermetabolism without well-defined CT correlate. Example in the right proximal femoral shaft at a S.U.V. max of 4.2. Heterogeneous marrow density is most apparent in the thoracic spine and bilateral ribs. Cortical irregularity within the tenth and eighth posterolateral left ribs, likely due to a pathologic fractures. Heterogeneous density within the left greater than right scapula with suggestion of cortical destruction on the left on image 76/series 4.  EXTREMITIES  No soft tissue hypermetabolism within the extremities.  IMPRESSION: 1. Extensive marrow hypermetabolism and heterogeneous density, most consistent with relatively diffuse marrow involvement by myeloma. 2. No evidence of hypermetabolic soft tissue disease. 3. Coronary artery atherosclerosis. Aortic Atherosclerosis (ICD10-I70.0). 4. Hepatic morphology for which mild cirrhosis cannot be excluded. Correlate with risk factors.   Electronically Signed   By: Abigail Miyamoto M.D.   On: 08/13/2017 14:20   Nov 30 th/2018 Ultrasound of abdomen. IMPRESSION: Status post right nephrectomy and cholecystectomy. 10.3 cm simple left renal cyst is noted. Pancreas not well visualized due to overlying bowel gas. No other abnormality seen in the abdomen.  PATHOLOGY:  Bone marrow biopsy: 08/15/17     Cytogenetics: 08/15/17         ASSESSMENT & PLAN:   IgA kappa multiple myeloma with >95% plasma  cells in bone marrow:  -Most recent myeloma labs on 09/09/17 reviewed. M-spike 0.9%. Kappa/lambda light chain ratio markedly elevated at 2,077.70. -Started Revlimid/Velcade/Decadron on 09/09/17.  -Due for Velcade injection today. Dr. Talbert Cage reviewed labs and OK to proceed with  Velcade inj as scheduled today.   -Return to cancer center as scheduled for subsequent Velcade injections. Continue Revlimid/Decadron as directed (daily on days 1-21).   -Will add-on follow-up visit for day 1, cycle #2 in 09/2017.  Encouraged him to call us if he experiences any new/concerning symptoms and we can certainly see him sooner, if needed. He agreed with this plan.   Anemia/Thrombocytopenia:  -Likely secondary to multiple myeloma. Recently hospitalized on 08/29/17 with critically low Hgb at 5.9 g/dL; also found to have platelet count of 25,000 on 08/29/17 prior to admission. Received 3 units PRBCs and 1 unit platelets during hospital stay with improvement in his Hgb to ~8 and Plts to ~50K.   -Most recent labs reviewed from 09/09/17. Hgb 8.7 g/dL and Plts 24,000. No symptomatic anemia and no reported bleeding episodes.   Bone pain:  -Likely secondary to malignant disease burden.  His pain is affecting his ability to sleep. He has previously tolerated Tramadol well.  -New Rx for Tramadol 50 mg po Q6Hprn, #30, no refills given to patient today. Reviewed with him how to take this medication, common side effects and to use caution when taking opiates, particularly with driving.  If Tramadol is ineffective, then could consider hydrocodone or oxycodone.  -Reinforced the importance of adequate bowel regimen to prevent constipation when taking opiates.   Kidney failure:  -He only has 1 kidney and may need dialysis.  -Managed by nephrology. Strongly encouraged Tom Marshall to follow-up with his kidney doctor as directed.   Goals of care:  -Noted to be DNR during recent hospitalization. Briefly discussed code status and goals of  care with him today. He would like to remain Full Code as an outpatient; "I would want you to try to bring me back."   -We discussed that multiple myeloma is often not curable, but treatable. The goal is to obtain remission and maintain remission without progression for as long as able.  He understands that his disease is not curable, but is treatable.   Patient has anemia and thrombocytopenia secondary to myeloma involvement Ultrasound of abdomen was nondiagnostic Continue second cycle of chemotherapy.  Reevaluation in 3 weeks.  Myeloma panel is pending. Further workup for anemia is pending and lab data are not available at present time

## 2017-10-01 LAB — IGG, IGA, IGM
IGA: 135 mg/dL (ref 61–437)
IGG (IMMUNOGLOBIN G), SERUM: 222 mg/dL — AB (ref 700–1600)
IgM (Immunoglobulin M), Srm: 9 mg/dL — ABNORMAL LOW (ref 20–172)

## 2017-10-02 LAB — PROTEIN ELECTROPHORESIS, SERUM
A/G RATIO SPE: 1.5 (ref 0.7–1.7)
Albumin ELP: 3.3 g/dL (ref 2.9–4.4)
Alpha-1-Globulin: 0.3 g/dL (ref 0.0–0.4)
Alpha-2-Globulin: 0.8 g/dL (ref 0.4–1.0)
BETA GLOBULIN: 0.8 g/dL (ref 0.7–1.3)
GLOBULIN, TOTAL: 2.2 g/dL (ref 2.2–3.9)
Gamma Globulin: 0.3 g/dL — ABNORMAL LOW (ref 0.4–1.8)
M-SPIKE, %: 0.1 g/dL — AB
Total Protein ELP: 5.5 g/dL — ABNORMAL LOW (ref 6.0–8.5)

## 2017-10-02 LAB — KAPPA/LAMBDA LIGHT CHAINS
KAPPA FREE LGHT CHN: 477.2 mg/L — AB (ref 3.3–19.4)
KAPPA, LAMDA LIGHT CHAIN RATIO: 51.31 — AB (ref 0.26–1.65)
LAMDA FREE LIGHT CHAINS: 9.3 mg/L (ref 5.7–26.3)

## 2017-10-03 ENCOUNTER — Encounter (HOSPITAL_BASED_OUTPATIENT_CLINIC_OR_DEPARTMENT_OTHER): Payer: Medicare Other

## 2017-10-03 VITALS — BP 129/60 | HR 90 | Temp 97.8°F | Resp 20

## 2017-10-03 DIAGNOSIS — C9000 Multiple myeloma not having achieved remission: Secondary | ICD-10-CM | POA: Diagnosis not present

## 2017-10-03 DIAGNOSIS — Z5112 Encounter for antineoplastic immunotherapy: Secondary | ICD-10-CM

## 2017-10-03 LAB — IMMUNOFIXATION ELECTROPHORESIS
IGG (IMMUNOGLOBIN G), SERUM: 225 mg/dL — AB (ref 700–1600)
IGM (IMMUNOGLOBULIN M), SRM: 9 mg/dL — AB (ref 20–172)
IgA: 135 mg/dL (ref 61–437)
TOTAL PROTEIN ELP: 5.5 g/dL — AB (ref 6.0–8.5)

## 2017-10-03 MED ORDER — PROCHLORPERAZINE MALEATE 10 MG PO TABS
ORAL_TABLET | ORAL | Status: AC
  Filled 2017-10-03: qty 1

## 2017-10-03 MED ORDER — BORTEZOMIB CHEMO SQ INJECTION 3.5 MG (2.5MG/ML)
1.3000 mg/m2 | Freq: Once | INTRAMUSCULAR | Status: AC
  Administered 2017-10-03: 2.75 mg via SUBCUTANEOUS
  Filled 2017-10-03: qty 2.75

## 2017-10-03 MED ORDER — PROCHLORPERAZINE MALEATE 10 MG PO TABS
10.0000 mg | ORAL_TABLET | Freq: Once | ORAL | Status: AC
  Administered 2017-10-03: 10 mg via ORAL

## 2017-10-03 NOTE — Patient Instructions (Signed)
[place] Discharge Instructions for Patients Receiving Chemotherapy  Today you received the following chemotherapy agents velcade today.    If you develop nausea and vomiting that is not controlled by your nausea medication, call the clinic.   BELOW ARE SYMPTOMS THAT SHOULD BE REPORTED IMMEDIATELY:  *FEVER GREATER THAN 100.5 F  *CHILLS WITH OR WITHOUT FEVER  NAUSEA AND VOMITING THAT IS NOT CONTROLLED WITH YOUR NAUSEA MEDICATION  *UNUSUAL SHORTNESS OF BREATH  *UNUSUAL BRUISING OR BLEEDING  TENDERNESS IN MOUTH AND THROAT WITH OR WITHOUT PRESENCE OF ULCERS  *URINARY PROBLEMS  *BOWEL PROBLEMS  UNUSUAL RASH Items with * indicate a potential emergency and should be followed up as soon as possible.  Feel free to call the clinic should you have any questions or concerns. The clinic phone number is [phone].  Please show the [place] at check-in to the Emergency Department and triage nurse.

## 2017-10-03 NOTE — Progress Notes (Signed)
Reviewed patients complaints of cough with yellow colored phlegm but no fevers per patient.  He stated he does have prn chills but does not check his temperature.  Auscultated bilateral lungs and clear.  Left shoulder discomfort and uses OTC medications for relief.    Patient stated new "tremmor" in left hand.  Right hand grip stronger than left grip.  Denied numbness or tingling in fingers.  Left arm warm to touch.  Reviewed with Dr. Talbert Cage and instructed the patient to follow up with primary physician due to not being related to myeloma per Dr. Talbert Cage.  Verbalized understanding.    Patient tolerated velcade shot with no complaints voiced.  Injection site clean and dry with no bruising or swelling at site. Band aid applied.  VSS with discharge and left ambulatory with family.

## 2017-10-07 ENCOUNTER — Inpatient Hospital Stay (HOSPITAL_COMMUNITY): Payer: Medicare Other

## 2017-10-07 ENCOUNTER — Other Ambulatory Visit (HOSPITAL_COMMUNITY): Payer: Medicare Other

## 2017-10-08 ENCOUNTER — Encounter (HOSPITAL_COMMUNITY): Payer: Medicare Other

## 2017-10-08 ENCOUNTER — Encounter (HOSPITAL_BASED_OUTPATIENT_CLINIC_OR_DEPARTMENT_OTHER): Payer: Medicare Other

## 2017-10-08 ENCOUNTER — Other Ambulatory Visit: Payer: Self-pay

## 2017-10-08 ENCOUNTER — Encounter (HOSPITAL_COMMUNITY): Payer: Self-pay

## 2017-10-08 ENCOUNTER — Telehealth (HOSPITAL_COMMUNITY): Payer: Self-pay | Admitting: Oncology

## 2017-10-08 VITALS — BP 126/50 | HR 92 | Temp 97.8°F | Resp 20 | Wt 200.0 lb

## 2017-10-08 DIAGNOSIS — Z5112 Encounter for antineoplastic immunotherapy: Secondary | ICD-10-CM | POA: Diagnosis present

## 2017-10-08 DIAGNOSIS — D472 Monoclonal gammopathy: Secondary | ICD-10-CM | POA: Diagnosis not present

## 2017-10-08 DIAGNOSIS — C9000 Multiple myeloma not having achieved remission: Secondary | ICD-10-CM

## 2017-10-08 DIAGNOSIS — D649 Anemia, unspecified: Secondary | ICD-10-CM

## 2017-10-08 DIAGNOSIS — Z79899 Other long term (current) drug therapy: Secondary | ICD-10-CM | POA: Diagnosis not present

## 2017-10-08 DIAGNOSIS — R000 Tachycardia, unspecified: Secondary | ICD-10-CM | POA: Diagnosis not present

## 2017-10-08 LAB — CBC WITH DIFFERENTIAL/PLATELET
Basophils Absolute: 0 10*3/uL (ref 0.0–0.1)
Basophils Relative: 0 %
EOS PCT: 0 %
Eosinophils Absolute: 0 10*3/uL (ref 0.0–0.7)
HEMATOCRIT: 19.7 % — AB (ref 39.0–52.0)
HEMOGLOBIN: 6.5 g/dL — AB (ref 13.0–17.0)
LYMPHS PCT: 10 %
Lymphs Abs: 0.8 10*3/uL (ref 0.7–4.0)
MCH: 30.4 pg (ref 26.0–34.0)
MCHC: 33 g/dL (ref 30.0–36.0)
MCV: 92.1 fL (ref 78.0–100.0)
MONO ABS: 0.2 10*3/uL (ref 0.1–1.0)
Monocytes Relative: 2 %
NEUTROS PCT: 88 %
Neutro Abs: 7 10*3/uL (ref 1.7–7.7)
Platelets: 33 10*3/uL — ABNORMAL LOW (ref 150–400)
RBC: 2.14 MIL/uL — AB (ref 4.22–5.81)
RDW: 15.3 % (ref 11.5–15.5)
WBC: 8 10*3/uL (ref 4.0–10.5)

## 2017-10-08 LAB — COMPREHENSIVE METABOLIC PANEL
ALBUMIN: 3.5 g/dL (ref 3.5–5.0)
ALT: 17 U/L (ref 17–63)
AST: 24 U/L (ref 15–41)
Alkaline Phosphatase: 153 U/L — ABNORMAL HIGH (ref 38–126)
Anion gap: 14 (ref 5–15)
BILIRUBIN TOTAL: 0.7 mg/dL (ref 0.3–1.2)
BUN: 75 mg/dL — AB (ref 6–20)
CHLORIDE: 102 mmol/L (ref 101–111)
CO2: 19 mmol/L — ABNORMAL LOW (ref 22–32)
Calcium: 8 mg/dL — ABNORMAL LOW (ref 8.9–10.3)
Creatinine, Ser: 4.28 mg/dL — ABNORMAL HIGH (ref 0.61–1.24)
GFR calc Af Amer: 15 mL/min — ABNORMAL LOW (ref >60)
GFR calc non Af Amer: 13 mL/min — ABNORMAL LOW (ref >60)
GLUCOSE: 217 mg/dL — AB (ref 65–99)
POTASSIUM: 4.8 mmol/L (ref 3.5–5.1)
Sodium: 135 mmol/L (ref 135–145)
Total Protein: 5.8 g/dL — ABNORMAL LOW (ref 6.5–8.1)

## 2017-10-08 LAB — PREPARE RBC (CROSSMATCH)

## 2017-10-08 MED ORDER — BORTEZOMIB CHEMO SQ INJECTION 3.5 MG (2.5MG/ML)
1.3000 mg/m2 | Freq: Once | INTRAMUSCULAR | Status: AC
  Administered 2017-10-08: 2.75 mg via SUBCUTANEOUS
  Filled 2017-10-08: qty 2.75

## 2017-10-08 MED ORDER — PROCHLORPERAZINE MALEATE 10 MG PO TABS
10.0000 mg | ORAL_TABLET | Freq: Once | ORAL | Status: AC
  Administered 2017-10-08: 10 mg via ORAL

## 2017-10-08 MED ORDER — SODIUM CHLORIDE 0.9% FLUSH: 10.0000 mL | INTRAVENOUS | Status: DC | PRN

## 2017-10-08 MED ORDER — PROCHLORPERAZINE MALEATE 10 MG PO TABS
ORAL_TABLET | ORAL | Status: AC
  Filled 2017-10-08: qty 1

## 2017-10-08 MED ORDER — LENALIDOMIDE 25 MG PO CAPS
25.0000 mg | ORAL_CAPSULE | Freq: Every day | ORAL | 3 refills | Status: DC
Start: 2017-10-08 — End: 2017-11-01

## 2017-10-08 NOTE — Progress Notes (Signed)
Tom Marshall presents today for injection per MD orders. Velcade 2.75mg  administered SQ in right Abdomen. Administration without incident. Patient tolerated well.    Treatment given per orders. Patient tolerated it well without problems. Vitals stable and discharged home from clinic ambulatory. Follow up as scheduled.

## 2017-10-08 NOTE — Patient Instructions (Signed)
[facility] Discharge Instructions for Patients Receiving Chemotherapy   Beginning January 23rd 2017 lab work for the Cancer Center will be done in the  Main lab at [facility] on 1st floor. If you have a lab appointment with the Cancer Center please come in thru the  Main Entrance and check in at the main information desk   Today you received the following chemotherapy agents   To help prevent nausea and vomiting after your treatment, we encourage you to take your nausea medication     If you develop nausea and vomiting, or diarrhea that is not controlled by your medication, call the clinic.  The clinic phone number is [phone]. Office hours are Monday-Friday 8:30am-5:00pm.  BELOW ARE SYMPTOMS THAT SHOULD BE REPORTED IMMEDIATELY:  *FEVER GREATER THAN 101.0 F  *CHILLS WITH OR WITHOUT FEVER  NAUSEA AND VOMITING THAT IS NOT CONTROLLED WITH YOUR NAUSEA MEDICATION  *UNUSUAL SHORTNESS OF BREATH  *UNUSUAL BRUISING OR BLEEDING  TENDERNESS IN MOUTH AND THROAT WITH OR WITHOUT PRESENCE OF ULCERS  *URINARY PROBLEMS  *BOWEL PROBLEMS  UNUSUAL RASH Items with * indicate a potential emergency and should be followed up as soon as possible. If you have an emergency after office hours please contact your primary care physician or go to the nearest emergency department.  Please call the clinic during office hours if you have any questions or concerns.   You may also contact the Patient Navigator at [phone] should you have any questions or need assistance in obtaining follow up care.      Resources For Cancer Patients and their Caregivers ? American Cancer Society: Can assist with transportation, wigs, general needs, runs Look Good Feel Better.        1-888-227-6333 ? Cancer Care: Provides financial assistance, online support groups, medication/co-pay assistance.  1-800-813-HOPE (4673) ? [facility] Assists [place] cancer  patients and their families through emotional , educational and financial support.  336-427-4357 ? [place] DSS Where to apply for food stamps, Medicaid and utility assistance. 336-342-1394 ? [employer]: Transportation to medical appointments. 336-347-2287 ? Social Security Administration: May apply for disability if have a Stage IV cancer. 336-342-7796 1-800-772-1213 ? [place] Aging, Disability and Transit Services: Assists with nutrition, care and transit needs. 336-349-2343         

## 2017-10-08 NOTE — Progress Notes (Signed)
CRITICAL VALUE ALERT Critical value received:  Hemoglobin 6.5 Date of notification:  10/08/2017 Time of notification: 7793 Critical value read back:  Yes.   Nurse who received alert:  Joanne Gavel RN MD notified (1st page):  Dr Talbert Cage Orders received for 2 units of PRBCs.    Spoke with Lauren in the lab to let her know that pt would be getting 2 units of PRBCs tomorrow.  Pt is coming back down after chemo to have type and screen drawn and prepare has been released.

## 2017-10-09 ENCOUNTER — Encounter (HOSPITAL_BASED_OUTPATIENT_CLINIC_OR_DEPARTMENT_OTHER): Payer: Medicare Other

## 2017-10-09 ENCOUNTER — Encounter (HOSPITAL_COMMUNITY): Payer: Self-pay

## 2017-10-09 ENCOUNTER — Other Ambulatory Visit (HOSPITAL_COMMUNITY): Payer: Medicare Other

## 2017-10-09 DIAGNOSIS — C9000 Multiple myeloma not having achieved remission: Secondary | ICD-10-CM | POA: Diagnosis not present

## 2017-10-09 DIAGNOSIS — Z79899 Other long term (current) drug therapy: Secondary | ICD-10-CM | POA: Diagnosis not present

## 2017-10-09 DIAGNOSIS — D649 Anemia, unspecified: Secondary | ICD-10-CM

## 2017-10-09 DIAGNOSIS — D472 Monoclonal gammopathy: Secondary | ICD-10-CM | POA: Diagnosis not present

## 2017-10-09 DIAGNOSIS — R000 Tachycardia, unspecified: Secondary | ICD-10-CM | POA: Diagnosis not present

## 2017-10-09 LAB — IGG, IGA, IGM
IGA: 75 mg/dL (ref 61–437)
IGM (IMMUNOGLOBULIN M), SRM: 8 mg/dL — AB (ref 20–172)
IgG (Immunoglobin G), Serum: 210 mg/dL — ABNORMAL LOW (ref 700–1600)

## 2017-10-09 LAB — KAPPA/LAMBDA LIGHT CHAINS
Kappa free light chain: 109.9 mg/L — ABNORMAL HIGH (ref 3.3–19.4)
Kappa, lambda light chain ratio: 15.48 — ABNORMAL HIGH (ref 0.26–1.65)
LAMDA FREE LIGHT CHAINS: 7.1 mg/L (ref 5.7–26.3)

## 2017-10-09 MED ORDER — ACETAMINOPHEN 325 MG PO TABS
650.0000 mg | ORAL_TABLET | Freq: Once | ORAL | Status: AC
  Administered 2017-10-09: 650 mg via ORAL
  Filled 2017-10-09: qty 2

## 2017-10-09 MED ORDER — SODIUM CHLORIDE 0.9 % IV SOLN
250.0000 mL | Freq: Once | INTRAVENOUS | Status: AC
  Administered 2017-10-09: 250 mL via INTRAVENOUS

## 2017-10-09 MED ORDER — SODIUM CHLORIDE 0.9% FLUSH
10.0000 mL | INTRAVENOUS | Status: AC | PRN
  Administered 2017-10-09: 10 mL

## 2017-10-09 MED ORDER — DIPHENHYDRAMINE HCL 25 MG PO CAPS
25.0000 mg | ORAL_CAPSULE | Freq: Once | ORAL | Status: AC
  Administered 2017-10-09: 25 mg via ORAL
  Filled 2017-10-09: qty 1

## 2017-10-09 NOTE — Progress Notes (Signed)
2 units of blood given today per orders. Treatment given per orders. Patient tolerated it well without problems. Vitals stable and discharged home from clinic ambulatory. Follow up as scheduled.  

## 2017-10-09 NOTE — Patient Instructions (Signed)
[facility] Discharge Instructions  RECOMMENDATIONS MADE BY THE CONSULTANT AND ANY TEST RESULTS WILL BE SENT TO YOUR REFERRING PHYSICIAN.  2 units of blood given Follow up as scheduled.  Thank you for choosing [facility] at [facility] to provide your oncology and hematology care.  To afford each patient quality time with our provider, please arrive at least 15 minutes before your scheduled appointment time.    If you have a lab appointment with the Cancer Center please come in thru the  Main Entrance and check in at the main information desk  You need to re-schedule your appointment should you arrive 10 or more minutes late.  We strive to give you quality time with our providers, and arriving late affects you and other patients whose appointments are after yours.  Also, if you no show three or more times for appointments you may be dismissed from the clinic at the providers discretion.     Again, thank you for choosing [facility].  Our hope is that these requests will decrease the amount of time that you wait before being seen by our physicians.       _____________________________________________________________  Should you have questions after your visit to [facility], please contact our office at [phone] between the hours of 8:30 a.m. and 4:30 p.m.  Voicemails left after 4:30 p.m. will not be returned until the following business day.  For prescription refill requests, have your pharmacy contact our office.       Resources For Cancer Patients and their Caregivers ? American Cancer Society: Can assist with transportation, wigs, general needs, runs Look Good Feel Better.        1-888-227-6333 ? Cancer Care: Provides financial assistance, online support groups, medication/co-pay assistance.  1-800-813-HOPE (4673) ? [facility] Assists [place] cancer patients and  their families through emotional , educational and financial support.  336-427-4357 ? [place] DSS Where to apply for food stamps, Medicaid and utility assistance. 336-342-1394 ? [employer]: Transportation to medical appointments. 336-347-2287 ? Social Security Administration: May apply for disability if have a Stage IV cancer. 336-342-7796 1-800-772-1213 ? [place] Aging, Disability and Transit Services: Assists with nutrition, care and transit needs. 336-349-2343  Cancer Center Support Programs: [email or web address]@ > Cancer Support Group  2nd Tuesday of the month 1pm-2pm, Journey Room  > Creative Journey  3rd Tuesday of the month 1130am-1pm, Journey Room  > Look Good Feel Better  1st Wednesday of the month 10am-12 noon, Journey Room (Call American [employer] to register 1-800-395-5775)   

## 2017-10-10 ENCOUNTER — Inpatient Hospital Stay (HOSPITAL_COMMUNITY): Payer: Medicare Other

## 2017-10-10 LAB — IMMUNOFIXATION ELECTROPHORESIS
IGM (IMMUNOGLOBULIN M), SRM: 8 mg/dL — AB (ref 20–172)
IgA: 71 mg/dL (ref 61–437)
IgG (Immunoglobin G), Serum: 211 mg/dL — ABNORMAL LOW (ref 700–1600)
Total Protein ELP: 5.2 g/dL — ABNORMAL LOW (ref 6.0–8.5)

## 2017-10-10 LAB — TYPE AND SCREEN
ABO/RH(D): A POS
Antibody Screen: NEGATIVE
Unit division: 0
Unit division: 0

## 2017-10-10 LAB — PROTEIN ELECTROPHORESIS, SERUM
A/G Ratio: 1.6 (ref 0.7–1.7)
ALPHA-1-GLOBULIN: 0.3 g/dL (ref 0.0–0.4)
ALPHA-2-GLOBULIN: 0.7 g/dL (ref 0.4–1.0)
Albumin ELP: 3.1 g/dL (ref 2.9–4.4)
Beta Globulin: 0.7 g/dL (ref 0.7–1.3)
GLOBULIN, TOTAL: 2 g/dL — AB (ref 2.2–3.9)
Gamma Globulin: 0.2 g/dL — ABNORMAL LOW (ref 0.4–1.8)
M-SPIKE, %: 0.1 g/dL — AB
Total Protein ELP: 5.1 g/dL — ABNORMAL LOW (ref 6.0–8.5)

## 2017-10-10 LAB — BPAM RBC
BLOOD PRODUCT EXPIRATION DATE: 201812272359
BLOOD PRODUCT EXPIRATION DATE: 201812272359
ISSUE DATE / TIME: 201812121357
ISSUE DATE / TIME: 201812121148
UNIT TYPE AND RH: 6200
UNIT TYPE AND RH: 6200

## 2017-10-11 ENCOUNTER — Encounter (HOSPITAL_BASED_OUTPATIENT_CLINIC_OR_DEPARTMENT_OTHER): Payer: Medicare Other

## 2017-10-11 ENCOUNTER — Other Ambulatory Visit: Payer: Self-pay

## 2017-10-11 VITALS — BP 124/66 | HR 77 | Resp 20

## 2017-10-11 DIAGNOSIS — C9000 Multiple myeloma not having achieved remission: Secondary | ICD-10-CM | POA: Diagnosis not present

## 2017-10-11 DIAGNOSIS — Z5112 Encounter for antineoplastic immunotherapy: Secondary | ICD-10-CM | POA: Diagnosis present

## 2017-10-11 MED ORDER — PROCHLORPERAZINE MALEATE 10 MG PO TABS
10.0000 mg | ORAL_TABLET | Freq: Once | ORAL | Status: AC
  Administered 2017-10-11: 10 mg via ORAL

## 2017-10-11 MED ORDER — BORTEZOMIB CHEMO SQ INJECTION 3.5 MG (2.5MG/ML)
1.3000 mg/m2 | Freq: Once | INTRAMUSCULAR | Status: AC
  Administered 2017-10-11: 2.75 mg via SUBCUTANEOUS
  Filled 2017-10-11: qty 2.75

## 2017-10-11 NOTE — Patient Instructions (Signed)
[facility] Discharge Instructions for Patients Receiving Chemotherapy   Beginning January 23rd 2017 lab work for the Cancer Center will be done in the  Main lab at [facility] on 1st floor. If you have a lab appointment with the Cancer Center please come in thru the  Main Entrance and check in at the main information desk   Today you received the following chemotherapy agents   To help prevent nausea and vomiting after your treatment, we encourage you to take your nausea medication     If you develop nausea and vomiting, or diarrhea that is not controlled by your medication, call the clinic.  The clinic phone number is [phone]. Office hours are Monday-Friday 8:30am-5:00pm.  BELOW ARE SYMPTOMS THAT SHOULD BE REPORTED IMMEDIATELY:  *FEVER GREATER THAN 101.0 F  *CHILLS WITH OR WITHOUT FEVER  NAUSEA AND VOMITING THAT IS NOT CONTROLLED WITH YOUR NAUSEA MEDICATION  *UNUSUAL SHORTNESS OF BREATH  *UNUSUAL BRUISING OR BLEEDING  TENDERNESS IN MOUTH AND THROAT WITH OR WITHOUT PRESENCE OF ULCERS  *URINARY PROBLEMS  *BOWEL PROBLEMS  UNUSUAL RASH Items with * indicate a potential emergency and should be followed up as soon as possible. If you have an emergency after office hours please contact your primary care physician or go to the nearest emergency department.  Please call the clinic during office hours if you have any questions or concerns.   You may also contact the Patient Navigator at [phone] should you have any questions or need assistance in obtaining follow up care.      Resources For Cancer Patients and their Caregivers ? American Cancer Society: Can assist with transportation, wigs, general needs, runs Look Good Feel Better.        1-888-227-6333 ? Cancer Care: Provides financial assistance, online support groups, medication/co-pay assistance.  1-800-813-HOPE (4673) ? [facility] Assists [place] cancer  patients and their families through emotional , educational and financial support.  336-427-4357 ? [place] DSS Where to apply for food stamps, Medicaid and utility assistance. 336-342-1394 ? [employer]: Transportation to medical appointments. 336-347-2287 ? Social Security Administration: May apply for disability if have a Stage IV cancer. 336-342-7796 1-800-772-1213 ? [place] Aging, Disability and Transit Services: Assists with nutrition, care and transit needs. 336-349-2343         

## 2017-10-11 NOTE — Progress Notes (Signed)
[name] presents today for injection per MD orders. Velcade 2.75 mg administered SQ in left Abdomen. Administration without incident. Patient tolerated well.  Treatment given per orders. Patient tolerated it well without problems. Vitals stable and discharged home from clinic ambulatory. Follow up as scheduled.

## 2017-10-17 ENCOUNTER — Other Ambulatory Visit: Payer: Self-pay

## 2017-10-17 DIAGNOSIS — Z01810 Encounter for preprocedural cardiovascular examination: Secondary | ICD-10-CM

## 2017-10-17 DIAGNOSIS — N189 Chronic kidney disease, unspecified: Secondary | ICD-10-CM

## 2017-10-17 DIAGNOSIS — N179 Acute kidney failure, unspecified: Secondary | ICD-10-CM

## 2017-10-18 ENCOUNTER — Encounter: Payer: Self-pay | Admitting: Oncology

## 2017-10-18 NOTE — Progress Notes (Signed)
[place], [place] [address]   CLINIC:  Medical Oncology/Hematology  PCP:  Asencion Noble, MD [address] [place] [address] [phone]   REASON FOR VISIT:  Follow-up for IgA kappa multiple myeloma with >95% plasma cells in bone marrow  CURRENT THERAPY: Revlimid/Decadron/Velcade, beginning 09/09/17    HISTORY OF PRESENT ILLNESS:  (from Dr. Laverle Patter last note on 08/29/17)  Tom Marshall 66 y.o. 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 Patient was initially admitted to [facility] from 07/23/2017 through 07/31/2017 with a 10 day history of lower back pain, fatigue, pallor. CT of the abdomen and pelvis in the emergency department revealed Interval increase in size of a large cyst the inferior pole of the left kidney 10.4 cm greatest size, and a number of questionable lytic lesions are identified within thoracolumbar vertebra, subtle lytic metastases not excluded. Fracture identified at the left transverse process of L3, new since 2010.The patient was also noted to be in acute kidney injury with serum creatinine of 7.34, calcium 11.7. (baseline creatinine 0.8-1.0 from July 2018) CBC on 07/23/2017 demonstrated WBC 8.2K, hemoglobin 7.9 g/dL, hematocrit 23%, platelet count 56K. SPEP 07/24/17 demonstrated two peaks in the beta-gamma region which may represent monoclonal protein. Immunofixation shows IgA monoclonal protein with kappa light chain specificity. Free kappa light chains 14,886.4, lambda light chains 10.2, free kappa/lambda light chain ratio 1459.45. Immunoglobulins demonstrated IgA 788, IgG 261. Urine IFE demonstrated Bence Jones Protein positive; kappa type. He received pamidronate on 07/26/17 with improvement in his hypercalcemia. At the time of discharge on 07/31/2017 CBC demonstrated WBC 7.3K, hemoglobin 7.9 g/dL, platelet count 28K. Serum iron levels were normal, ferritin was elevated at 911. At the time of  discharge his creatinine was 5.98, calcium 8.3.   INTERVAL HISTORY:  Tom Marshall 66 y.o. male returns for follow-up for multiple myeloma.   Here for cycle #3 of RVD therapy for recently diagnosed multiple myeloma.   Patient was last seen by Dr. Bryson Ha on 09/30/2017 where he was noted to feel pretty good.  Appetite and energy levels were 75%.  He admitted to feeling weak at times.  He was taking Revlimid and Decadron as prescribed.  He denied any rash or diarrhea.  Denies shortness of breath severe fatigue or bleeding.  He denied any blood in his stools, hematuria or nosebleeds.  He did admit to bilateral shoulder pain. Had ultrasound of her abdomen which did not reveal any abnormality.  Being followed by nephrologist for renal failure. Patient continued to be severely anemic as well as has thrombocytopenia most likely secondary to myelomatous involvement of bone marrow.  He was to begin second cycle of chemo.  Patient with severe anemia hemoglobin of 6.5 on 10/08/2017 and was given 2 units of PRBCs.   Today patient presents for cycle 3 of Velcade.  He admits to a decrease in energy levels and decrease in appetite.  He continues to be fatigued and admits to shortness of breath with exertion.  He recently noticed bilateral lower extremity edema that resolves with elevation.  Patient states he just got over an upper respiratory infection.  He still has an occasional cough.  Worse at night.  Patient does admit of occasional bright red spotting in stools.  He only notes 2-3 occasions where he can remember visibly seeing blood in stool.  He denies hemorrhoids.  He denies pain with bowel movements.  He notes daily bowel movements that are "normal".  He  has seen a Dr. Gala Romney previously for a colonoscopy.   REVIEW OF SYSTEMS:  Review of Systems  Constitutional: Positive for fatigue.  HENT:  Negative.  Negative for nosebleeds.   Eyes: Negative.   Respiratory: Positive for shortness of breath.        With  exertion  Cardiovascular: Positive for leg swelling. Negative for chest pain.       + 1 bilateral lower extremities  Gastrointestinal: Positive for blood in stool and diarrhea. Negative for constipation, nausea and vomiting.       Occasional diarrhea  Endocrine: Negative.   Genitourinary: Positive for dyspareunia. Negative for bladder incontinence and hematuria.   Musculoskeletal: Positive for arthralgias.  Skin: Negative.  Negative for rash.  Neurological: Negative.  Negative for dizziness.  Hematological: Negative.   Psychiatric/Behavioral: Positive for sleep disturbance.   Swelling of lower extremity  PAST MEDICAL/SURGICAL HISTORY:  Past Medical History:  Diagnosis Date  . Cancer ([place])    multiple myeloma  . Cancer of right kidney ([place])   . Cervical dystonia   . Diabetes mellitus without complication [facility])    Past Surgical History:  Procedure Laterality Date  . CHOLECYSTECTOMY  2007  . NEPHRECTOMY Right 1998   cancer     SOCIAL HISTORY:  Social History   Socioeconomic History  . Marital status: Single    Spouse name: Not on file  . Number of children: Not on file  . Years of education: Not on file  . Highest education level: Not on file  Social Needs  . Financial resource strain: Not on file  . Food insecurity - worry: Not on file  . Food insecurity - inability: Not on file  . Transportation needs - medical: Not on file  . Transportation needs - non-medical: Not on file  Occupational History  . Occupation: Marine scientist, Insurance underwriter  Tobacco Use  . Smoking status: Never Smoker  . Smokeless tobacco: Current User    Types: Chew  Substance and Sexual Activity  . Alcohol use: No  . Drug use: No  . Sexual activity: Not on file  Other Topics Concern  . Not on file  Social History Narrative  . Not on file    FAMILY HISTORY:  Family History  Problem Relation Age of Onset  . Heart failure Mother 37  . Dementia Father     CURRENT MEDICATIONS:    Outpatient Encounter Medications as of 10/23/2017  Medication Sig  . acyclovir (ZOVIRAX) 400 MG tablet Take 1 tablet (400 mg total) 2 (two) times daily by mouth.  [name] allopurinol (ZYLOPRIM) 300 MG tablet Take 300 mg by mouth every morning.  [name] aspirin EC 81 MG tablet Take 81 mg daily by mouth.  . bortezomib IV (VELCADE) 3.5 MG injection Inject once into the vein. Days 1,4,8,11 every 21 days  . cyclobenzaprine (FLEXERIL) 10 MG tablet Take 10 mg by mouth at bedtime.  [name] dexamethasone (DECADRON) 4 MG tablet Take 10 tablets (40 mg) on days 1, 8, and 15 of chemo. Repeat every 21 days.  [name] doxycycline (VIBRA-TABS) 100 MG tablet Take 1 tablet by mouth 2 (two) times daily.  [name] glipiZIDE (GLIPIZIDE XL) 2.5 MG 24 hr tablet Take 1 tablet (2.5 mg total) by mouth daily with breakfast.  . Ibuprofen-Diphenhydramine Cit (ADVIL PM PO) Take by mouth.  . lenalidomide (REVLIMID) 25 MG capsule Take 1 capsule (25 mg total) by mouth daily. Take 1 capsule on days 1-14. Repeat every 21 days.  . Multiple Vitamins-Minerals (CENTRUM SILVER  50+MEN) TABS Take 1 tablet by mouth every morning.  . pravastatin (PRAVACHOL) 20 MG tablet Take 20 mg by mouth every morning.  . sodium bicarbonate 650 MG tablet Take 1 tablet (650 mg total) by mouth 2 (two) times daily. (Patient taking differently: Take 650 mg by mouth 3 (three) times daily. )  . traMADol (ULTRAM) 50 MG tablet Take 1 tablet (50 mg total) every 6 (six) hours as needed by mouth.   Facility-Administered Encounter Medications as of 10/23/2017  Medication  . 0.9 %  sodium chloride infusion    ALLERGIES:  No Known Allergies   PHYSICAL EXAM:  ECOG Performance status: 1 - Symptomatic; remains independent   Vitals:   10/23/17 1109  BP: (!) 134/55  Pulse: 99  Resp: 20  Temp: 97.9 F (36.6 C)  SpO2: 100%   Filed Weights   10/23/17 1109  Weight: 210 lb 12.8 oz (95.6 kg)    Physical Exam  Constitutional: He is oriented to person, place, and time and  well-developed, well-nourished, and in no distress.  HENT:  Head: Normocephalic.  Mouth/Throat: Oropharynx is clear and moist. No oropharyngeal exudate.  Eyes: Conjunctivae are normal. Pupils are equal, round, and reactive to light. No scleral icterus.  Neck: Normal range of motion. Neck supple.  Cardiovascular: Normal rate and regular rhythm.  Pulmonary/Chest: Effort normal. No respiratory distress. He has wheezes in the right lower field.  Abdominal: Soft. Bowel sounds are normal. He exhibits no distension. There is no tenderness.  Musculoskeletal: Normal range of motion. He exhibits edema (2+ edema).       Right shoulder: He exhibits no swelling.  Lymphadenopathy:    He has no cervical adenopathy.       Right: No supraclavicular adenopathy present.       Left: No supraclavicular adenopathy present.  Neurological: He is alert and oriented to person, place, and time. No cranial nerve deficit.  Skin: Skin is warm and dry. No rash noted. There is pallor.  Swelling of lower extremity 2+  Psychiatric: Mood, memory, affect and judgment normal.  Nursing note and vitals reviewed.    LABORATORY DATA:  I have reviewed the labs as listed.  CBC    Component Value Date/Time   WBC 1.5 (L) 10/23/2017 1014   RBC 1.91 (L) 10/23/2017 1014   HGB 5.6 (LL) 10/23/2017 1014   HCT 17.4 (L) 10/23/2017 1014   PLT 64 (L) 10/23/2017 1014   MCV 91.1 10/23/2017 1014   MCH 29.3 10/23/2017 1014   MCHC 32.2 10/23/2017 1014   RDW 17.0 (H) 10/23/2017 1014   LYMPHSABS 0.6 (L) 10/23/2017 1014   MONOABS 0.0 (L) 10/23/2017 1014   EOSABS 0.1 10/23/2017 1014   BASOSABS 0.0 10/23/2017 1014   CMP Latest Ref Rng & Units 10/23/2017 10/08/2017 09/30/2017  Glucose 65 - 99 mg/dL 141(H) 217(H) 192(H)  BUN 6 - 20 mg/dL 63(H) 75(H) 60(H)  Creatinine 0.61 - 1.24 mg/dL 3.08(H) 4.28(H) 4.16(H)  Sodium 135 - 145 mmol/L 137 135 131(L)  Potassium 3.5 - 5.1 mmol/L 4.0 4.8 4.8  Chloride 101 - 111 mmol/L 105 102 100(L)  CO2  22 - 32 mmol/L 19(L) 19(L) 19(L)  Calcium 8.9 - 10.3 mg/dL 7.9(L) 8.0(L) 8.3(L)  Total Protein 6.5 - 8.1 g/dL 5.6(L) 5.8(L) 6.0(L)  Total Bilirubin 0.3 - 1.2 mg/dL 0.5 0.7 0.5  Alkaline Phos 38 - 126 U/L 264(H) 153(H) 99  AST 15 - 41 U/L 22 24 14(L)  ALT 17 - 63 U/L 37 17 22  Results for Tom Marshall, Tom Marshall (MRN [id number])   Ref. Range 09/09/2017 13:14  Total Protein ELP Latest Ref Range: 6.0 - 8.5 g/dL 7.7  Albumin ELP Latest Ref Range: 2.9 - 4.4 g/dL 3.8  Globulin, Total Latest Ref Range: 2.2 - 3.9 g/dL 3.9  A/G Ratio Latest Ref Range: 0.7 - 1.7  1.0  Alpha-1-Globulin Latest Ref Range: 0.0 - 0.4 g/dL 0.2  Alpha-2-Globulin Latest Ref Range: 0.4 - 1.0 g/dL 1.0  Beta Globulin Latest Ref Range: 0.7 - 1.3 g/dL 1.1  Gamma Globulin Latest Ref Range: 0.4 - 1.8 g/dL 1.5  M-SPIKE, % Latest Ref Range: Not Observed g/dL 0.9 (H)  SPE Interp. Unknown Comment  Comment Unknown Comment  IgG (Immunoglobin G), Serum Latest Ref Range: 700 - 1,600 mg/dL 316 (L)  IgA Latest Ref Range: 61 - 437 mg/dL 1,103 (H)  IgM (Immunoglobulin M), Srm Latest Ref Range: 20 - 172 mg/dL 19 (L)  Kappa free light chain Latest Ref Range: 3.3 - 19.4 mg/L 17,868.2 (H)  Lamda free light chains Latest Ref Range: 5.7 - 26.3 mg/L 8.6  Kappa, lamda light chain ratio Latest Ref Range: 0.26 - 1.65  2,077.70 (H)       PENDING LABS:    DIAGNOSTIC IMAGING:  *The following radiologic images and reports have been reviewed independently and agree with below findings.  PET scan: 08/13/17 CLINICAL DATA:  Initial treatment strategy for staging of multiple myeloma.  EXAM: NUCLEAR MEDICINE PET WHOLE BODY  TECHNIQUE: 10.7 mCi F-18 FDG was injected intravenously. Full-ring PET imaging was performed from the vertex to the feet after the radiotracer. CT data was obtained and used for attenuation correction and anatomic localization.  FASTING BLOOD GLUCOSE:  Value: 148 mg/dl  COMPARISON:  Skeletal survey of 08/02/2017.  Abdominopelvic CT 07/23/2017. Chest CT 01/25/2009.  FINDINGS: HEAD/NECK  No areas of abnormal hypermetabolism.  No cervical adenopathy.  CHEST  No pulmonary parenchymal or thoracic nodal hypermetabolism identified. Mild bilateral gynecomastia. Mild cardiomegaly with multivessel coronary artery atherosclerosis. No thoracic adenopathy. Bibasilar scarring.  ABDOMEN/PELVIS  No abdominopelvic nodal or parenchymal hypermetabolism. Right nephrectomy. Normal adrenal glands. Abdominal aortic atherosclerosis. Caudate enlargement and medial segment left liver lobe atrophy. Cholecystectomy. Lower pole left renal dominant 9.8 cm low-density lesion is most consistent with a cyst. Fat containing left larger than right inguinal hernias. Mild prostatomegaly.  SKELETON  Relatively diffuse heterogeneous marrow hypermetabolism. Example focus within the sternum measures a S.U.V. max of 6.7. Bilateral femoral shaft foci of hypermetabolism without well-defined CT correlate. Example in the right proximal femoral shaft at a S.U.V. max of 4.2. Heterogeneous marrow density is most apparent in the thoracic spine and bilateral ribs. Cortical irregularity within the tenth and eighth posterolateral left ribs, likely due to a pathologic fractures. Heterogeneous density within the left greater than right scapula with suggestion of cortical destruction on the left on image 76/series 4.  EXTREMITIES  No soft tissue hypermetabolism within the extremities.  IMPRESSION: 1. Extensive marrow hypermetabolism and heterogeneous density, most consistent with relatively diffuse marrow involvement by myeloma. 2. No evidence of hypermetabolic soft tissue disease. 3. Coronary artery atherosclerosis. Aortic Atherosclerosis (ICD10-I70.0). 4. Hepatic morphology for which mild cirrhosis cannot be excluded. Correlate with risk factors.   Electronically Signed   By: Abigail Miyamoto M.D.   On: 08/13/2017  14:20   Nov 30 th/2018 Ultrasound of abdomen. IMPRESSION: Status post right nephrectomy and cholecystectomy. 10.3 cm simple left renal cyst is noted. Pancreas not well visualized due to overlying bowel gas. No other  abnormality seen in the abdomen.  PATHOLOGY:  Bone marrow biopsy: 08/15/17     Cytogenetics: 08/15/17         ASSESSMENT & PLAN:   IgA kappa multiple myeloma with >95% plasma cells in bone marrow:  -Most recent myeloma labs on 10/08/17 reviewed. M-spike 0.1%. Kappa/lambda light chain ratio markedly improved at 15.48. -Started Revlimid/Velcade/Decadron on 09/09/17.  -Due for Velcade injection today.  Labs reveal severe anemia with a hemoglobin of 5.6.  We will hold Velcade injection today.  Patient will return next week for reevaluation of anemia with possible Velcade injection. -Anemia: Give 2 units PRBC today.  -Referral to GI.  Patient has seen Dr. Servando Snare in the past.  He admits to occasional bright red bleeding from rectum. -Return to cancer center as scheduled for subsequent Velcade injections. Continue Revlimid/Decadron as directed (daily on days 1-21).   -He will be reevaluated next week prior to Velcade injection. He agreed with this plan.   Anemia/Thrombocytopenia:  -Likely secondary to multiple myeloma. Recently hospitalized on 08/29/17 with critically low Hgb at 5.9 g/dL; also found to have platelet count of 25,000 on 08/29/17 prior to admission. Received 3 units PRBCs and 1 unit platelets during hospital stay with improvement in his Hgb to ~8 and Plts to ~50K.   -Most recent labs reviewed from today indicating severe anemia- Hemoglobin 5.6. Platelets 64. Give 2 units of PRBC today.   Bone pain:  -Likely secondary to malignant disease burden.  His pain is affecting his ability to sleep. He has previously tolerated Tramadol well. Continue Tramadol.   Kidney failure:  -He only has 1 kidney and may need dialysis. Kidney numbers better today.  -Managed  by nephrology. Strongly encouraged Tom Marshall to follow-up with his kidney doctor as directed. Has an appointment next week per patient.  Shortness of Breath: R/T anemia. Oxygen saturations ok. Mild wheezing in RLL.  Patient notes to have recently had an upper respiratory infection.  He managed with over-the-counter medications.  Peripheral Edema: Noted in last provider note as 2+ pitting edema. Improved today at 1+ pitting edema.  Continue to elevate legs and to apply compression stockings as needed.  If this appears to worsen please let us know.

## 2017-10-23 ENCOUNTER — Encounter (HOSPITAL_COMMUNITY): Payer: Medicare Other

## 2017-10-23 ENCOUNTER — Other Ambulatory Visit: Payer: Self-pay

## 2017-10-23 ENCOUNTER — Encounter (HOSPITAL_BASED_OUTPATIENT_CLINIC_OR_DEPARTMENT_OTHER): Payer: Medicare Other | Admitting: Oncology

## 2017-10-23 ENCOUNTER — Encounter (HOSPITAL_COMMUNITY): Payer: Self-pay | Admitting: Oncology

## 2017-10-23 ENCOUNTER — Other Ambulatory Visit (HOSPITAL_COMMUNITY): Payer: Self-pay | Admitting: Adult Health

## 2017-10-23 DIAGNOSIS — D649 Anemia, unspecified: Secondary | ICD-10-CM

## 2017-10-23 DIAGNOSIS — R000 Tachycardia, unspecified: Secondary | ICD-10-CM | POA: Diagnosis not present

## 2017-10-23 DIAGNOSIS — C9000 Multiple myeloma not having achieved remission: Secondary | ICD-10-CM | POA: Diagnosis not present

## 2017-10-23 DIAGNOSIS — R0602 Shortness of breath: Secondary | ICD-10-CM | POA: Diagnosis not present

## 2017-10-23 DIAGNOSIS — N19 Unspecified kidney failure: Secondary | ICD-10-CM

## 2017-10-23 DIAGNOSIS — D696 Thrombocytopenia, unspecified: Secondary | ICD-10-CM | POA: Diagnosis not present

## 2017-10-23 DIAGNOSIS — R600 Localized edema: Secondary | ICD-10-CM

## 2017-10-23 DIAGNOSIS — Z79899 Other long term (current) drug therapy: Secondary | ICD-10-CM | POA: Diagnosis not present

## 2017-10-23 DIAGNOSIS — M898X9 Other specified disorders of bone, unspecified site: Secondary | ICD-10-CM

## 2017-10-23 DIAGNOSIS — D472 Monoclonal gammopathy: Secondary | ICD-10-CM | POA: Diagnosis not present

## 2017-10-23 LAB — CBC WITH DIFFERENTIAL/PLATELET
BASOS ABS: 0 10*3/uL (ref 0.0–0.1)
Basophils Relative: 2 %
Eosinophils Absolute: 0.1 10*3/uL (ref 0.0–0.7)
Eosinophils Relative: 4 %
HEMATOCRIT: 17.4 % — AB (ref 39.0–52.0)
HEMOGLOBIN: 5.6 g/dL — AB (ref 13.0–17.0)
LYMPHS ABS: 0.6 10*3/uL — AB (ref 0.7–4.0)
LYMPHS PCT: 39 %
MCH: 29.3 pg (ref 26.0–34.0)
MCHC: 32.2 g/dL (ref 30.0–36.0)
MCV: 91.1 fL (ref 78.0–100.0)
Monocytes Absolute: 0 10*3/uL — ABNORMAL LOW (ref 0.1–1.0)
Monocytes Relative: 3 %
NEUTROS ABS: 0.8 10*3/uL — AB (ref 1.7–7.7)
Neutrophils Relative %: 52 %
Platelets: 64 10*3/uL — ABNORMAL LOW (ref 150–400)
RBC: 1.91 MIL/uL — AB (ref 4.22–5.81)
RDW: 17 % — ABNORMAL HIGH (ref 11.5–15.5)
WBC: 1.5 10*3/uL — AB (ref 4.0–10.5)

## 2017-10-23 LAB — COMPREHENSIVE METABOLIC PANEL
ALK PHOS: 264 U/L — AB (ref 38–126)
ALT: 37 U/L (ref 17–63)
AST: 22 U/L (ref 15–41)
Albumin: 3.2 g/dL — ABNORMAL LOW (ref 3.5–5.0)
Anion gap: 13 (ref 5–15)
BILIRUBIN TOTAL: 0.5 mg/dL (ref 0.3–1.2)
BUN: 63 mg/dL — AB (ref 6–20)
CALCIUM: 7.9 mg/dL — AB (ref 8.9–10.3)
CHLORIDE: 105 mmol/L (ref 101–111)
CO2: 19 mmol/L — ABNORMAL LOW (ref 22–32)
CREATININE: 3.08 mg/dL — AB (ref 0.61–1.24)
GFR calc Af Amer: 23 mL/min — ABNORMAL LOW (ref >60)
GFR, EST NON AFRICAN AMERICAN: 19 mL/min — AB (ref >60)
Glucose, Bld: 141 mg/dL — ABNORMAL HIGH (ref 65–99)
Potassium: 4 mmol/L (ref 3.5–5.1)
Sodium: 137 mmol/L (ref 135–145)
TOTAL PROTEIN: 5.6 g/dL — AB (ref 6.5–8.1)

## 2017-10-23 LAB — PREPARE RBC (CROSSMATCH)

## 2017-10-23 MED ORDER — SODIUM CHLORIDE 0.9 % IV SOLN
250.0000 mL | Freq: Once | INTRAVENOUS | Status: AC
  Administered 2017-10-23: 250 mL via INTRAVENOUS

## 2017-10-23 MED ORDER — DIPHENHYDRAMINE HCL 25 MG PO CAPS
25.0000 mg | ORAL_CAPSULE | Freq: Once | ORAL | Status: AC
  Administered 2017-10-23: 25 mg via ORAL

## 2017-10-23 MED ORDER — DIPHENHYDRAMINE HCL 25 MG PO CAPS
ORAL_CAPSULE | ORAL | Status: AC
  Filled 2017-10-23: qty 1

## 2017-10-23 MED ORDER — ACETAMINOPHEN 325 MG PO TABS
650.0000 mg | ORAL_TABLET | Freq: Once | ORAL | Status: AC
  Administered 2017-10-23: 650 mg via ORAL

## 2017-10-23 MED ORDER — SODIUM CHLORIDE 0.9% FLUSH
3.0000 mL | INTRAVENOUS | Status: AC | PRN
  Administered 2017-10-23: 3 mL

## 2017-10-23 MED ORDER — ACETAMINOPHEN 325 MG PO TABS
ORAL_TABLET | ORAL | Status: AC
  Filled 2017-10-23: qty 2

## 2017-10-23 NOTE — Progress Notes (Signed)
CRITICAL VALUE ALERT Critical value received:  Hemoglobin 5.5 Date of notification:  10/23/2017 Time of notification: 1032 Critical value read back:  Yes.   Nurse who received alert:  Joanne Gavel RN MD notified (1st page):  Mike Craze NP

## 2017-10-23 NOTE — Progress Notes (Signed)
To treatment area for blood transfusion x 2 units today after oncology follow up visit.  No Velcade today.   Patient tolerated blood transfusions with no complaints voiced.  Peripheral IV site clean and dry with no bruising or swelling noted at site.  VSS with discharge and left with family with no s/s of distress noted.

## 2017-10-23 NOTE — Patient Instructions (Signed)
[place] at [facility]  Discharge Instructions:  You received 2 units of blood today.  _______________________________________________________________  Thank you for choosing [place] at [facility] to provide your oncology and hematology care.  To afford each patient quality time with our providers, please arrive at least 15 minutes before your scheduled appointment.  You need to re-schedule your appointment if you arrive 10 or more minutes late.  We strive to give you quality time with our providers, and arriving late affects you and other patients whose appointments are after yours.  Also, if you no show three or more times for appointments you may be dismissed from the clinic.  Again, thank you for choosing [place] at [place] hope is that these requests will allow you access to exceptional care and in a timely manner. _______________________________________________________________  If you have questions after your visit, please contact our office at [phone] between the hours of 8:30 a.m. and 5:00 p.m. Voicemails left after 4:30 p.m. will not be returned until the following business day. _______________________________________________________________  For prescription refill requests, have your pharmacy contact our office. _______________________________________________________________  Recommendations made by the consultant and any test results will be sent to your referring physician. _______________________________________________________________

## 2017-10-24 LAB — PROTEIN ELECTROPHORESIS, SERUM
A/G RATIO SPE: 1.6 (ref 0.7–1.7)
ALPHA-1-GLOBULIN: 0.3 g/dL (ref 0.0–0.4)
ALPHA-2-GLOBULIN: 0.8 g/dL (ref 0.4–1.0)
Albumin ELP: 3.1 g/dL (ref 2.9–4.4)
BETA GLOBULIN: 0.7 g/dL (ref 0.7–1.3)
GLOBULIN, TOTAL: 2 g/dL — AB (ref 2.2–3.9)
Gamma Globulin: 0.2 g/dL — ABNORMAL LOW (ref 0.4–1.8)
Total Protein ELP: 5.1 g/dL — ABNORMAL LOW (ref 6.0–8.5)

## 2017-10-24 LAB — BPAM RBC
BLOOD PRODUCT EXPIRATION DATE: 201901062359
Blood Product Expiration Date: 201901092359
ISSUE DATE / TIME: 201812261224
ISSUE DATE / TIME: 201812261403
UNIT TYPE AND RH: 6200
Unit Type and Rh: 6200

## 2017-10-24 LAB — IGG, IGA, IGM
IGA: 32 mg/dL — AB (ref 61–437)
IgG (Immunoglobin G), Serum: 185 mg/dL — ABNORMAL LOW (ref 700–1600)
IgM (Immunoglobulin M), Srm: 12 mg/dL — ABNORMAL LOW (ref 20–172)

## 2017-10-24 LAB — TYPE AND SCREEN
ABO/RH(D): A POS
Antibody Screen: NEGATIVE
Unit division: 0
Unit division: 0

## 2017-10-24 LAB — KAPPA/LAMBDA LIGHT CHAINS
KAPPA FREE LGHT CHN: 41.1 mg/L — AB (ref 3.3–19.4)
Kappa, lambda light chain ratio: 5.63 — ABNORMAL HIGH (ref 0.26–1.65)
Lambda free light chains: 7.3 mg/L (ref 5.7–26.3)

## 2017-10-25 ENCOUNTER — Encounter: Payer: Self-pay | Admitting: Internal Medicine

## 2017-10-25 LAB — IMMUNOFIXATION ELECTROPHORESIS
IGA: 32 mg/dL — AB (ref 61–437)
IGG (IMMUNOGLOBIN G), SERUM: 182 mg/dL — AB (ref 700–1600)
IgM (Immunoglobulin M), Srm: 13 mg/dL — ABNORMAL LOW (ref 20–172)
Total Protein ELP: 5.1 g/dL — ABNORMAL LOW (ref 6.0–8.5)

## 2017-10-28 ENCOUNTER — Inpatient Hospital Stay (HOSPITAL_COMMUNITY): Payer: Medicare Other

## 2017-10-28 ENCOUNTER — Encounter (HOSPITAL_COMMUNITY): Payer: Medicare Other

## 2017-10-30 ENCOUNTER — Inpatient Hospital Stay (HOSPITAL_COMMUNITY): Payer: Medicare Other

## 2017-10-30 ENCOUNTER — Encounter (HOSPITAL_COMMUNITY): Payer: Self-pay | Admitting: Hematology and Oncology

## 2017-10-30 ENCOUNTER — Other Ambulatory Visit (HOSPITAL_COMMUNITY)
Admission: RE | Admit: 2017-10-30 | Discharge: 2017-10-30 | Disposition: A | Discharge status: Home / Self Care | Payer: Medicare Other | Source: Ambulatory Visit | Attending: Nephrology | Admitting: Nephrology

## 2017-10-30 ENCOUNTER — Other Ambulatory Visit: Payer: Self-pay

## 2017-10-30 ENCOUNTER — Inpatient Hospital Stay (HOSPITAL_COMMUNITY): Payer: Medicare Other | Attending: Internal Medicine

## 2017-10-30 ENCOUNTER — Inpatient Hospital Stay (HOSPITAL_BASED_OUTPATIENT_CLINIC_OR_DEPARTMENT_OTHER): Payer: Medicare Other | Admitting: Hematology and Oncology

## 2017-10-30 VITALS — BP 123/42 | HR 87 | Temp 97.7°F | Resp 20 | Wt 213.0 lb

## 2017-10-30 DIAGNOSIS — E875 Hyperkalemia: Secondary | ICD-10-CM | POA: Diagnosis not present

## 2017-10-30 DIAGNOSIS — Z79899 Other long term (current) drug therapy: Secondary | ICD-10-CM | POA: Insufficient documentation

## 2017-10-30 DIAGNOSIS — Z5111 Encounter for antineoplastic chemotherapy: Secondary | ICD-10-CM

## 2017-10-30 DIAGNOSIS — D649 Anemia, unspecified: Secondary | ICD-10-CM | POA: Insufficient documentation

## 2017-10-30 DIAGNOSIS — C9000 Multiple myeloma not having achieved remission: Secondary | ICD-10-CM

## 2017-10-30 DIAGNOSIS — D696 Thrombocytopenia, unspecified: Secondary | ICD-10-CM | POA: Diagnosis not present

## 2017-10-30 DIAGNOSIS — Z5112 Encounter for antineoplastic immunotherapy: Secondary | ICD-10-CM

## 2017-10-30 DIAGNOSIS — N289 Disorder of kidney and ureter, unspecified: Secondary | ICD-10-CM | POA: Insufficient documentation

## 2017-10-30 LAB — COMPREHENSIVE METABOLIC PANEL
ALBUMIN: 3.4 g/dL — AB (ref 3.5–5.0)
ALT: 48 U/L (ref 17–63)
AST: 23 U/L (ref 15–41)
Alkaline Phosphatase: 233 U/L — ABNORMAL HIGH (ref 38–126)
Anion gap: 14 (ref 5–15)
BILIRUBIN TOTAL: 0.8 mg/dL (ref 0.3–1.2)
BUN: 63 mg/dL — AB (ref 6–20)
CO2: 20 mmol/L — AB (ref 22–32)
Calcium: 8.1 mg/dL — ABNORMAL LOW (ref 8.9–10.3)
Chloride: 104 mmol/L (ref 101–111)
Creatinine, Ser: 2.77 mg/dL — ABNORMAL HIGH (ref 0.61–1.24)
GFR calc Af Amer: 26 mL/min — ABNORMAL LOW (ref >60)
GFR calc non Af Amer: 22 mL/min — ABNORMAL LOW (ref >60)
GLUCOSE: 143 mg/dL — AB (ref 65–99)
Potassium: 4 mmol/L (ref 3.5–5.1)
SODIUM: 138 mmol/L (ref 135–145)
TOTAL PROTEIN: 5.7 g/dL — AB (ref 6.5–8.1)

## 2017-10-30 LAB — CBC WITH DIFFERENTIAL/PLATELET
BASOS ABS: 0 10*3/uL (ref 0.0–0.1)
BASOS PCT: 1 %
Eosinophils Absolute: 0.1 10*3/uL (ref 0.0–0.7)
Eosinophils Relative: 3 %
HEMATOCRIT: 21.8 % — AB (ref 39.0–52.0)
HEMOGLOBIN: 7 g/dL — AB (ref 13.0–17.0)
Lymphocytes Relative: 59 %
Lymphs Abs: 1.2 10*3/uL (ref 0.7–4.0)
MCH: 29.7 pg (ref 26.0–34.0)
MCHC: 32.1 g/dL (ref 30.0–36.0)
MCV: 92.4 fL (ref 78.0–100.0)
Monocytes Absolute: 0.3 10*3/uL (ref 0.1–1.0)
Monocytes Relative: 13 %
NEUTROS ABS: 0.5 10*3/uL (ref 1.7–7.7)
NEUTROS PCT: 24 %
Platelets: 121 10*3/uL — ABNORMAL LOW (ref 150–400)
RBC: 2.36 MIL/uL — ABNORMAL LOW (ref 4.22–5.81)
RDW: 17.7 % — ABNORMAL HIGH (ref 11.5–15.5)
WBC: 2 10*3/uL — ABNORMAL LOW (ref 4.0–10.5)

## 2017-10-30 LAB — FERRITIN: FERRITIN: 1118 ng/mL — AB (ref 24–336)

## 2017-10-30 LAB — IRON AND TIBC
Iron: 212 ug/dL — ABNORMAL HIGH (ref 45–182)
Saturation Ratios: 93 % — ABNORMAL HIGH (ref 17.9–39.5)
TIBC: 228 ug/dL — AB (ref 250–450)
UIBC: 16 ug/dL

## 2017-10-30 LAB — PROTEIN / CREATININE RATIO, URINE
CREATININE, URINE: 37.26 mg/dL
Protein Creatinine Ratio: 0.94 mg/mg{Cre} — ABNORMAL HIGH (ref 0.00–0.15)
Total Protein, Urine: 35 mg/dL

## 2017-10-30 LAB — PREPARE RBC (CROSSMATCH)

## 2017-10-30 LAB — PHOSPHORUS: Phosphorus: 3.4 mg/dL (ref 2.5–4.6)

## 2017-10-30 LAB — SAMPLE TO BLOOD BANK

## 2017-10-30 NOTE — Progress Notes (Signed)
Velcade held today and for next week. One unit of blood ordered for tomorrow.

## 2017-10-30 NOTE — Progress Notes (Signed)
Patient referred to [facility] Hematology/Oncology.  Appointment scheduled with Dr. Norma Fredrickson for 12/09/17 @ 8AM (7:45 arrival).  New patient paperwork to be mailed to patient from [facility]. Patient records faxed to Attn: Darelle Toler [phone].

## 2017-10-30 NOTE — Progress Notes (Signed)
T & C and prepare released (irradiated). Hillary in [place] notified that we needed the blood for tomorrow and it needed to be irradiated.

## 2017-10-31 ENCOUNTER — Encounter (HOSPITAL_COMMUNITY): Payer: Self-pay

## 2017-10-31 ENCOUNTER — Inpatient Hospital Stay (HOSPITAL_COMMUNITY): Payer: Medicare Other

## 2017-10-31 DIAGNOSIS — C9000 Multiple myeloma not having achieved remission: Secondary | ICD-10-CM | POA: Diagnosis not present

## 2017-10-31 DIAGNOSIS — E875 Hyperkalemia: Secondary | ICD-10-CM | POA: Diagnosis not present

## 2017-10-31 DIAGNOSIS — D649 Anemia, unspecified: Secondary | ICD-10-CM | POA: Diagnosis not present

## 2017-10-31 DIAGNOSIS — D696 Thrombocytopenia, unspecified: Secondary | ICD-10-CM | POA: Diagnosis not present

## 2017-10-31 DIAGNOSIS — N289 Disorder of kidney and ureter, unspecified: Secondary | ICD-10-CM | POA: Diagnosis not present

## 2017-10-31 LAB — IGG, IGA, IGM
IgA: 28 mg/dL — ABNORMAL LOW (ref 61–437)
IgG (Immunoglobin G), Serum: 202 mg/dL — ABNORMAL LOW (ref 700–1600)
IgM (Immunoglobulin M), Srm: 16 mg/dL — ABNORMAL LOW (ref 20–172)

## 2017-10-31 LAB — PROTEIN ELECTROPHORESIS, SERUM
A/G RATIO SPE: 1.6 (ref 0.7–1.7)
ALBUMIN ELP: 3.1 g/dL (ref 2.9–4.4)
ALPHA-2-GLOBULIN: 0.7 g/dL (ref 0.4–1.0)
Alpha-1-Globulin: 0.3 g/dL (ref 0.0–0.4)
BETA GLOBULIN: 0.7 g/dL (ref 0.7–1.3)
Gamma Globulin: 0.2 g/dL — ABNORMAL LOW (ref 0.4–1.8)
Globulin, Total: 1.9 g/dL — ABNORMAL LOW (ref 2.2–3.9)
Total Protein ELP: 5 g/dL — ABNORMAL LOW (ref 6.0–8.5)

## 2017-10-31 LAB — KAPPA/LAMBDA LIGHT CHAINS
KAPPA FREE LGHT CHN: 27.4 mg/L — AB (ref 3.3–19.4)
KAPPA, LAMDA LIGHT CHAIN RATIO: 2.69 — AB (ref 0.26–1.65)
LAMDA FREE LIGHT CHAINS: 10.2 mg/L (ref 5.7–26.3)

## 2017-10-31 LAB — PARATHYROID HORMONE, INTACT (NO CA): PTH: 114 pg/mL — ABNORMAL HIGH (ref 15–65)

## 2017-10-31 LAB — IMMUNOFIXATION ELECTROPHORESIS
IGA: 29 mg/dL — AB (ref 61–437)
IGM (IMMUNOGLOBULIN M), SRM: 16 mg/dL — AB (ref 20–172)
IgG (Immunoglobin G), Serum: 202 mg/dL — ABNORMAL LOW (ref 700–1600)
Total Protein ELP: 5.1 g/dL — ABNORMAL LOW (ref 6.0–8.5)

## 2017-10-31 LAB — VITAMIN D 25 HYDROXY (VIT D DEFICIENCY, FRACTURES): VIT D 25 HYDROXY: 36.5 ng/mL (ref 30.0–100.0)

## 2017-10-31 MED ORDER — ACETAMINOPHEN 325 MG PO TABS
650.0000 mg | ORAL_TABLET | Freq: Once | ORAL | Status: AC
  Administered 2017-10-31: 650 mg via ORAL

## 2017-10-31 MED ORDER — DIPHENHYDRAMINE HCL 25 MG PO CAPS
25.0000 mg | ORAL_CAPSULE | Freq: Once | ORAL | Status: AC
  Administered 2017-10-31: 25 mg via ORAL

## 2017-10-31 MED ORDER — SODIUM CHLORIDE 0.9 % IV SOLN
250.0000 mL | Freq: Once | INTRAVENOUS | Status: AC
  Administered 2017-10-31: 250 mL via INTRAVENOUS

## 2017-10-31 NOTE — Progress Notes (Signed)
One unit of blood given today.   Treatment given per orders. Patient tolerated it well without problems. Vitals stable and discharged home from clinic ambulatory. Follow up as scheduled.

## 2017-10-31 NOTE — Patient Instructions (Signed)
[place] at [facility] Discharge Instructions  RECOMMENDATIONS MADE BY THE CONSULTANT AND ANY TEST RESULTS WILL BE SENT TO YOUR REFERRING PHYSICIAN.  One unit of blood given today Follow up as scheduled.  Thank you for choosing [place] at [facility] to provide your oncology and hematology care.  To afford each patient quality time with our provider, please arrive at least 15 minutes before your scheduled appointment time.    If you have a lab appointment with the [facility] please come in thru the  Main Entrance and check in at the main information desk  You need to re-schedule your appointment should you arrive 10 or more minutes late.  We strive to give you quality time with our providers, and arriving late affects you and other patients whose appointments are after yours.  Also, if you no show three or more times for appointments you may be dismissed from the clinic at the providers discretion.     Again, thank you for choosing [facility].  Our hope is that these requests will decrease the amount of time that you wait before being seen by our physicians.       _____________________________________________________________  Should you have questions after your visit to [facility] please contact our office at [phone] between the hours of 8:30 a.m. and 4:30 p.m.  Voicemails left after 4:30 p.m. will not be returned until the following business day.  For prescription refill requests, have your pharmacy contact our office.       Resources For Cancer Patients and their Caregivers ? American Cancer Society: Can assist with transportation, wigs, general needs, runs Look Good Feel Better.        609 550 3424 ? Cancer Care: Provides financial assistance, online support groups, medication/co-pay assistance.  1-800-813-HOPE 512-320-5418) ? [place] Assists [place] cancer  patients and their families through emotional , educational and financial support.  828-873-8097 ? [place] DSS Where to apply for food stamps, Medicaid and utility assistance. 2524489593 ? [facility]: Transportation to medical appointments. 763-285-0660 ? Social Security Administration: May apply for disability if have a Stage IV cancer. 830-851-0194 334-325-3593 ? [employer], Disability and Transit Services: Assists with nutrition, care and transit needs. [facility] Support Programs: @10RELATIVEDAYS @ > Cancer Support Group  2nd Tuesday of the month 1pm-2pm, Journey Room  > Creative Journey  3rd Tuesday of the month 1130am-1pm, Journey Room  > Look Good Feel Better  1st Wednesday of the month 10am-12 noon, Journey Room (Call [place] to register [phone])

## 2017-11-01 ENCOUNTER — Other Ambulatory Visit (HOSPITAL_COMMUNITY): Payer: Self-pay | Admitting: Oncology

## 2017-11-01 DIAGNOSIS — C9000 Multiple myeloma not having achieved remission: Secondary | ICD-10-CM

## 2017-11-01 LAB — TYPE AND SCREEN
ABO/RH(D): A POS
Antibody Screen: NEGATIVE
UNIT DIVISION: 0

## 2017-11-01 LAB — BPAM RBC
Blood Product Expiration Date: 201901172359
ISSUE DATE / TIME: 201901030941
Unit Type and Rh: 9500

## 2017-11-01 MED ORDER — LENALIDOMIDE 25 MG PO CAPS: 25.0000 mg | ORAL_CAPSULE | Freq: Every day | ORAL | 0 refills | Status: DC

## 2017-11-04 ENCOUNTER — Ambulatory Visit (HOSPITAL_COMMUNITY): Payer: Medicare Other | Admitting: Hematology and Oncology

## 2017-11-04 ENCOUNTER — Other Ambulatory Visit (HOSPITAL_COMMUNITY): Payer: Medicare Other

## 2017-11-04 ENCOUNTER — Inpatient Hospital Stay (HOSPITAL_COMMUNITY): Payer: Medicare Other

## 2017-11-05 ENCOUNTER — Encounter (HOSPITAL_COMMUNITY): Payer: Medicare Other

## 2017-11-07 ENCOUNTER — Inpatient Hospital Stay (HOSPITAL_COMMUNITY): Payer: Medicare Other

## 2017-11-07 DIAGNOSIS — D696 Thrombocytopenia, unspecified: Secondary | ICD-10-CM | POA: Diagnosis not present

## 2017-11-07 DIAGNOSIS — D649 Anemia, unspecified: Secondary | ICD-10-CM | POA: Diagnosis not present

## 2017-11-07 DIAGNOSIS — C9000 Multiple myeloma not having achieved remission: Secondary | ICD-10-CM

## 2017-11-07 DIAGNOSIS — E875 Hyperkalemia: Secondary | ICD-10-CM | POA: Diagnosis not present

## 2017-11-07 DIAGNOSIS — N289 Disorder of kidney and ureter, unspecified: Secondary | ICD-10-CM | POA: Diagnosis not present

## 2017-11-07 LAB — COMPREHENSIVE METABOLIC PANEL
ALT: 36 U/L (ref 17–63)
AST: 19 U/L (ref 15–41)
Albumin: 3.5 g/dL (ref 3.5–5.0)
Alkaline Phosphatase: 200 U/L — ABNORMAL HIGH (ref 38–126)
Anion gap: 13 (ref 5–15)
BILIRUBIN TOTAL: 0.5 mg/dL (ref 0.3–1.2)
BUN: 59 mg/dL — AB (ref 6–20)
CALCIUM: 7.7 mg/dL — AB (ref 8.9–10.3)
CO2: 21 mmol/L — ABNORMAL LOW (ref 22–32)
CREATININE: 3.03 mg/dL — AB (ref 0.61–1.24)
Chloride: 103 mmol/L (ref 101–111)
GFR, EST AFRICAN AMERICAN: 23 mL/min — AB (ref >60)
GFR, EST NON AFRICAN AMERICAN: 20 mL/min — AB (ref >60)
Glucose, Bld: 183 mg/dL — ABNORMAL HIGH (ref 65–99)
Potassium: 4.3 mmol/L (ref 3.5–5.1)
Sodium: 137 mmol/L (ref 135–145)
TOTAL PROTEIN: 5.8 g/dL — AB (ref 6.5–8.1)

## 2017-11-07 LAB — CBC WITH DIFFERENTIAL/PLATELET
Basophils Absolute: 0 10*3/uL (ref 0.0–0.1)
Basophils Relative: 1 %
Eosinophils Absolute: 0.1 10*3/uL (ref 0.0–0.7)
Eosinophils Relative: 3 %
HEMATOCRIT: 23.9 % — AB (ref 39.0–52.0)
Hemoglobin: 7.7 g/dL — ABNORMAL LOW (ref 13.0–17.0)
LYMPHS ABS: 0.8 10*3/uL (ref 0.7–4.0)
LYMPHS PCT: 22 %
MCH: 30.3 pg (ref 26.0–34.0)
MCHC: 32.2 g/dL (ref 30.0–36.0)
MCV: 94.1 fL (ref 78.0–100.0)
MONO ABS: 0.6 10*3/uL (ref 0.1–1.0)
Monocytes Relative: 16 %
NEUTROS ABS: 2.2 10*3/uL (ref 1.7–7.7)
Neutrophils Relative %: 58 %
Platelets: 111 10*3/uL — ABNORMAL LOW (ref 150–400)
RBC: 2.54 MIL/uL — AB (ref 4.22–5.81)
RDW: 18.2 % — ABNORMAL HIGH (ref 11.5–15.5)
WBC: 3.8 10*3/uL — AB (ref 4.0–10.5)

## 2017-11-07 LAB — SAMPLE TO BLOOD BANK

## 2017-11-08 ENCOUNTER — Inpatient Hospital Stay (HOSPITAL_COMMUNITY): Payer: Medicare Other

## 2017-11-08 ENCOUNTER — Other Ambulatory Visit (HOSPITAL_COMMUNITY): Payer: Medicare Other

## 2017-11-08 ENCOUNTER — Encounter (HOSPITAL_COMMUNITY): Payer: Medicare Other

## 2017-11-08 LAB — KAPPA/LAMBDA LIGHT CHAINS
Kappa free light chain: 24.4 mg/L — ABNORMAL HIGH (ref 3.3–19.4)
Kappa, lambda light chain ratio: 1.92 — ABNORMAL HIGH (ref 0.26–1.65)
LAMDA FREE LIGHT CHAINS: 12.7 mg/L (ref 5.7–26.3)

## 2017-11-08 LAB — IGG, IGA, IGM
IgA: 31 mg/dL — ABNORMAL LOW (ref 61–437)
IgG (Immunoglobin G), Serum: 209 mg/dL — ABNORMAL LOW (ref 700–1600)
IgM (Immunoglobulin M), Srm: 16 mg/dL — ABNORMAL LOW (ref 20–172)

## 2017-11-09 NOTE — Assessment & Plan Note (Signed)
67 y.o. male with diagnosis of active multiple myeloma with IgA kappa.  Patient has been started on treatment with  and thalidomide, bortezomib, and low-dose dexamethasone.  Previous cycle of therapy complicated by grade 3 anemia necessitating holding of bortezomib.  The anemia has improved slightly, but continues to remain in grade 3 range.  With that in mind, I will continue abstaining from bortezomib therapy at this time.  Patient will complete the rest of the cycle as previously indicated.  Plan: --Transfuse 1 unit of packed red blood cells for hemoglobin of 7.0 this morning. --Continue lenalidomide and dexamethasone therapy at this time -- Hold bortezomib for the remainder of the cycle --Return to infusion in 1 week with labs, and possible blood transfusion -- Return to clinic in 2 weeks: Labs, clinic visit, possible blood transfusion and consideration for possible initiation of the fourth cycle of systemic therapy.  In the future, my recommendation is to reduce the dose or hold lenalidomide instead of bortezomib as lenalidomide is much more likely to cause significant anemia as opposed to bortezomib.

## 2017-11-09 NOTE — Progress Notes (Signed)
[facility] Follow-up Visit:  Assessment: Multiple myeloma (Tom Marshall) 67 y.o. male with diagnosis of active multiple myeloma with IgA kappa.  Patient has been started on treatment with  and thalidomide, bortezomib, and low-dose dexamethasone.  Previous cycle of therapy complicated by grade 3 anemia necessitating holding of bortezomib.  The anemia has improved slightly, but continues to remain in grade 3 range.  With that in mind, I will continue abstaining from bortezomib therapy at this time.  Patient will complete the rest of the cycle as previously indicated.  Plan: --Transfuse 1 unit of packed red blood cells for hemoglobin of 7.0 this morning. --Continue lenalidomide and dexamethasone therapy at this time -- Hold bortezomib for the remainder of the cycle --Return to infusion in 1 week with labs, and possible blood transfusion -- Return to clinic in 2 weeks: Labs, clinic visit, possible blood transfusion and consideration for possible initiation of the fourth cycle of systemic therapy.  In the future, my recommendation is to reduce the dose or hold lenalidomide instead of bortezomib as lenalidomide is much more likely to cause significant anemia as opposed to bortezomib.  Voice recognition software was used and creation of this note. Despite my best effort at editing the text, some misspelling/errors may have occurred.  Orders Placed This Encounter  Procedures  . CBC with Differential    Standing Status:   Future    Standing Expiration Date:   10/30/2018  . Comprehensive metabolic panel    Standing Status:   Future    Standing Expiration Date:   10/30/2018  . Magnesium    Standing Status:   Future    Standing Expiration Date:   10/30/2018  . Phosphorus    Standing Status:   Future    Standing Expiration Date:   10/30/2018  . Beta 2 microglobulin    Standing Status:   Future    Standing Expiration Date:   10/30/2018  . QIG  (Quant. immunoglobulins  - IgG, IgA, IgM)   Standing Status:   Future    Standing Expiration Date:   10/30/2018  . SPEP with reflex to IFE    Standing Status:   Future    Standing Expiration Date:   10/30/2018  . Kappa/lambda light chains    Standing Status:   Future    Standing Expiration Date:   10/30/2018  . Ambulatory referral to Hematology / Oncology    Referral Priority:   Routine    Referral Type:   Consultation    Referral Reason:   Specialty Services Required    Referral Location:   [place]    Number of Visits Requested:   1    Cancer Staging No matching staging information was found for the patient.  All questions were answered.  . The patient knows to call the clinic with any problems, questions or concerns.  This note was electronically signed.    History of Presenting Illness Tom Marshall 67 y.o. presenting to the [facility] for IgA kappa multiple myeloma, currently undergoing induction therapy with bortezomib, lenalidomide, and low-dose dexamethasone since September 09, 2017.  Patient presents to the clinic for possible day 8 of cycle #3.  Previously, Velcade treatment was delayed due to severe anemia that required blood transfusions.  Patient continues taking lenalidomide as previously prescribed.  Since last visit to the clinic, patient denies any new complaints.  No interval fevers, chills or night sweats.  Denies any melena, hematochezia, hematuria, or other external  bleeding.  Energy level has improved a bit after receiving 2 units of packed red blood cells in transfusion.  Oncological/hematological History:  No history exists.    Medical History: Past Medical History:  Diagnosis Date  . Cancer ([place])    multiple myeloma  . Cancer of right kidney ([place])   . Cervical dystonia   . Diabetes mellitus without complication [facility])     Surgical History: Past Surgical History:  Procedure Laterality Date  . CHOLECYSTECTOMY  2007  . NEPHRECTOMY Right 1998   cancer    Family  History: Family History  Problem Relation Age of Onset  . Heart failure Mother 46  . Dementia Father     Social History: Social History   Socioeconomic History  . Marital status: Single    Spouse name: Not on file  . Number of children: Not on file  . Years of education: Not on file  . Highest education level: Not on file  Social Needs  . Financial resource strain: Not on file  . Food insecurity - worry: Not on file  . Food insecurity - inability: Not on file  . Transportation needs - medical: Not on file  . Transportation needs - non-medical: Not on file  Occupational History  . Occupation: Marine scientist, Insurance underwriter  Tobacco Use  . Smoking status: Never Smoker  . Smokeless tobacco: Current User    Types: Chew  Substance and Sexual Activity  . Alcohol use: No  . Drug use: No  . Sexual activity: Not on file  Other Topics Concern  . Not on file  Social History Narrative  . Not on file    Allergies: No Known Allergies  Medications:  Current Outpatient Medications  Medication Sig Dispense Refill  . acyclovir (ZOVIRAX) 400 MG tablet Take 1 tablet (400 mg total) 2 (two) times daily by mouth. 60 tablet 3  . allopurinol (ZYLOPRIM) 300 MG tablet Take 300 mg by mouth every morning.    [name] aspirin EC 81 MG tablet Take 81 mg daily by mouth.    . bortezomib IV (VELCADE) 3.5 MG injection Inject once into the vein. Days 1,4,8,11 every 21 days    . cyclobenzaprine (FLEXERIL) 10 MG tablet Take 10 mg by mouth at bedtime.    [name] dexamethasone (DECADRON) 4 MG tablet Take 10 tablets (40 mg) on days 1, 8, and 15 of chemo. Repeat every 21 days. 30 tablet 3  . doxycycline (VIBRA-TABS) 100 MG tablet Take 1 tablet by mouth 2 (two) times daily.    . furosemide (LASIX) 40 MG tablet     . glipiZIDE (GLIPIZIDE XL) 2.5 MG 24 hr tablet Take 1 tablet (2.5 mg total) by mouth daily with breakfast. 30 tablet 0  . Ibuprofen-Diphenhydramine Cit (ADVIL PM PO) Take by mouth.    . Multiple  Vitamins-Minerals (CENTRUM SILVER 50+MEN) TABS Take 1 tablet by mouth every morning.    . pravastatin (PRAVACHOL) 20 MG tablet Take 20 mg by mouth every morning.    . sodium bicarbonate 650 MG tablet Take 1 tablet (650 mg total) by mouth 2 (two) times daily. (Patient taking differently: Take 650 mg by mouth 3 (three) times daily. ) 60 tablet 1  . traMADol (ULTRAM) 50 MG tablet Take 1 tablet (50 mg total) every 6 (six) hours as needed by mouth. 30 tablet 0  . lenalidomide (REVLIMID) 25 MG capsule Take 1 capsule (25 mg total) by mouth daily. Take 1 capsule on days 1-14. Repeat every 21 days. 14 capsule  0   No current facility-administered medications for this visit.    Facility-Administered Medications Ordered in Other Visits  Medication Dose Route Frequency Provider Last Rate Last Dose  . 0.9 %  sodium chloride infusion  250 mL Intravenous Once Twana First, MD        Review of Systems: Review of Systems  All other systems reviewed and are negative.    PHYSICAL EXAMINATION There were no vitals taken for this visit.  ECOG PERFORMANCE STATUS: 1 - Symptomatic but completely ambulatory  Physical Exam  Constitutional: He is oriented to person, place, and time and well-developed, well-nourished, and in no distress. No distress.  HENT:  Head: Normocephalic and atraumatic.  Mouth/Throat: No oropharyngeal exudate.  Eyes: Conjunctivae and EOM are normal. Pupils are equal, round, and reactive to light. No scleral icterus.  Neck: No thyromegaly present.  Cardiovascular: Normal rate, regular rhythm and normal heart sounds.  No murmur heard. Pulmonary/Chest: Effort normal and breath sounds normal. No respiratory distress. He has no wheezes. He has no rales.  Abdominal: Soft. Bowel sounds are normal. He exhibits no distension. There is no tenderness. There is no rebound.  Musculoskeletal: He exhibits no edema.  Lymphadenopathy:    He has no cervical adenopathy.  Neurological: He is alert and  oriented to person, place, and time. He has normal reflexes. No cranial nerve deficit.  Skin: Skin is warm and dry. He is not diaphoretic. There is pallor.     LABORATORY DATA: I have personally reviewed the data as listed: Hospital Outpatient Visit on 10/30/2017  Component Date Value Ref Range Status  . Phosphorus 10/30/2017 3.4  2.5 - 4.6 mg/dL Final  . Iron 10/30/2017 212* 45 - 182 ug/dL Final  . TIBC 10/30/2017 228* 250 - 450 ug/dL Final  . Saturation Ratios 10/30/2017 93* 17.9 - 39.5 % Final  . UIBC 10/30/2017 16  ug/dL Final   Performed at [facility], [place] [address] [place], [place] [address]  . Ferritin 10/30/2017 1,118* 24 - 336 ng/mL Final   Performed at [facility], [place] [address] [place], [place] [address]  . PTH 10/30/2017 114* 15 - 65 pg/mL Final   Comment: (NOTE) Performed At: [facility] [place], [place] [id number] Rush Farmer MD IR:[id number]   . Vit D, 25-Hydroxy 10/30/2017 36.5  30.0 - 100.0 ng/mL Final   Comment: (NOTE) Vitamin D deficiency has been defined by the [place] practice guideline as a level of serum 25-OH vitamin D less than 20 ng/mL (1,2). The Endocrine Society went on to further define vitamin D insufficiency as a level between 21 and 29 ng/mL (2). 1. IOM (Institute of Medicine). 2010. Dietary reference   intakes for calcium and D. [place]: The   [employer]. 2. [place] MF, Binkley [place], Bischoff-Ferrari HA, et al.   Evaluation, treatment, and prevention of vitamin D   deficiency: an Endocrine Society clinical practice   guideline. JCEM. 2011 Jul; 96(7):1911-30. Performed At: [facility] [place], [place] [id number] Rush Farmer MD HB:[id number]   . Creatinine, Urine 10/30/2017 37.26  mg/dL Final  . Total Protein, Urine 10/30/2017 35  mg/dL Final   NO NORMAL RANGE ESTABLISHED FOR THIS TEST  . Protein Creatinine Ratio  10/30/2017 0.94* 0.00 - 0.15 mg/mg[Cre] Final  Infusion on 10/30/2017  Component Date Value Ref Range Status  . Order Confirmation 10/30/2017 ORDER PROCESSED BY BLOOD BANK   Final  .  ABO/RH(D) 10/30/2017 A POS   Final  . Antibody Screen 10/30/2017 NEG   Final  . Sample Expiration 10/30/2017 11/02/2017   Final  . Unit Number 10/30/2017 [id number]   Final  . Blood Component Type 10/30/2017 RBC, LR IRR   Final  . Unit division 10/30/2017 00   Final  . Status of Unit 10/30/2017 ISSUED,FINAL   Final  . Transfusion Status 10/30/2017 OK TO TRANSFUSE   Final  . Crossmatch Result 10/30/2017 Compatible   Final  . ISSUE DATE / TIME 10/30/2017 [id number]   Final  . Blood Product Unit Number 10/30/2017 [id number]   Final  . PRODUCT CODE 10/30/2017 [id number]   Final  . Unit Type and Rh 10/30/2017 9500   Final  . Blood Product Expiration Date 10/30/2017 [id number]   Final  Lab on 10/30/2017  Component Date Value Ref Range Status  . Blood Bank Specimen 10/30/2017 SAMPLE AVAILABLE FOR TESTING   Final  . Sample Expiration 10/30/2017 11/02/2017   Final  . WBC 10/30/2017 2.0* 4.0 - 10.5 K/uL Final   WHITE COUNT CONFIRMED ON SMEAR  . RBC 10/30/2017 2.36* 4.22 - 5.81 MIL/uL Final  . Hemoglobin 10/30/2017 7.0* 13.0 - 17.0 g/dL Final  . HCT 10/30/2017 21.8* 39.0 - 52.0 % Final  . MCV 10/30/2017 92.4  78.0 - 100.0 fL Final  . MCH 10/30/2017 29.7  26.0 - 34.0 pg Final  . MCHC 10/30/2017 32.1  30.0 - 36.0 g/dL Final  . RDW 10/30/2017 17.7* 11.5 - 15.5 % Final  . Platelets 10/30/2017 121* 150 - 400 K/uL Final  . Neutrophils Relative % 10/30/2017 24  % Final  . Neutro Abs 10/30/2017 0.5  1.7 - 7.7 K/uL Final  . Lymphocytes Relative 10/30/2017 59  % Final  . Lymphs Abs 10/30/2017 1.2  0.7 - 4.0 K/uL Final  . Monocytes Relative 10/30/2017 13  % Final  . Monocytes Absolute 10/30/2017 0.3  0.1 - 1.0 K/uL Final  . Eosinophils Relative 10/30/2017 3  % Final  . Eosinophils Absolute 10/30/2017 0.1  0.0 -  0.7 K/uL Final  . Basophils Relative 10/30/2017 1  % Final  . Basophils Absolute 10/30/2017 0.0  0.0 - 0.1 K/uL Final  . Sodium 10/30/2017 138  135 - 145 mmol/L Final  . Potassium 10/30/2017 4.0  3.5 - 5.1 mmol/L Final  . Chloride 10/30/2017 104  101 - 111 mmol/L Final  . CO2 10/30/2017 20* 22 - 32 mmol/L Final  . Glucose, Bld 10/30/2017 143* 65 - 99 mg/dL Final  . BUN 10/30/2017 63* 6 - 20 mg/dL Final  . Creatinine, Ser 10/30/2017 2.77* 0.61 - 1.24 mg/dL Final  . Calcium 10/30/2017 8.1* 8.9 - 10.3 mg/dL Final  . Total Protein 10/30/2017 5.7* 6.5 - 8.1 g/dL Final  . Albumin 10/30/2017 3.4* 3.5 - 5.0 g/dL Final  . AST 10/30/2017 23  15 - 41 U/L Final  . ALT 10/30/2017 48  17 - 63 U/L Final  . Alkaline Phosphatase 10/30/2017 233* 38 - 126 U/L Final  . Total Bilirubin 10/30/2017 0.8  0.3 - 1.2 mg/dL Final  . GFR calc non Af Amer 10/30/2017 22* >60 mL/min Final  . GFR calc Af Amer 10/30/2017 26* >60 mL/min Final   Comment: (NOTE) The eGFR has been calculated using the CKD EPI equation. This calculation has not been validated in all clinical situations. eGFR's persistently <60 mL/min signify possible Chronic Kidney Disease.   . Anion gap 10/30/2017 14  5 - 15 Final  . Kappa  free light chain 10/30/2017 27.4* 3.3 - 19.4 mg/L Final  . Lamda free light chains 10/30/2017 10.2  5.7 - 26.3 mg/L Final  . Kappa, lamda light chain ratio 10/30/2017 2.69* 0.26 - 1.65 Final   Comment: (NOTE) Performed At: [facility] [address] [place], [place] [id number] Rush Farmer MD RT:[id number]   . IgG (Immunoglobin G), Serum 10/30/2017 202* 700 - 1,600 mg/dL Final   Result confirmed on concentration.  . IgA 10/30/2017 28* 61 - 437 mg/dL Final   Result confirmed on concentration.  . IgM (Immunoglobulin M), Srm 10/30/2017 16* 20 - 172 mg/dL Final   Comment: (NOTE) Result confirmed on concentration. Performed At: [facility] [place], [place] [id number] Rush Farmer MD BD:[id number]   . Total Protein ELP 10/30/2017 5.0* 6.0 - 8.5 g/dL Final  . Albumin ELP 10/30/2017 3.1  2.9 - 4.4 g/dL Final  . Alpha-1-Globulin 10/30/2017 0.3  0.0 - 0.4 g/dL Final  . Alpha-2-Globulin 10/30/2017 0.7  0.4 - 1.0 g/dL Final  . Beta Globulin 10/30/2017 0.7  0.7 - 1.3 g/dL Final  . Gamma Globulin 10/30/2017 0.2* 0.4 - 1.8 g/dL Final  . M-Spike, % 10/30/2017 Not Observed  Not Observed g/dL Final  . SPE Interp. 10/30/2017 Comment   Final   Comment: (NOTE) The SPE pattern reflects virtual absence of gamma globulin which describes agammaglobulinemia. Serum immunoelectrophoresis and examination of the urine for Bence Jones protein may be indicated if warranted by the clinical picture. Performed At: [facility] [place], [place] [id number] Rush Farmer MD NO:[id number]   . Comment 10/30/2017 Comment   Final   Comment: (NOTE) Protein electrophoresis scan will follow via computer, mail, or courier delivery.   [name] GLOBULIN, TOTAL 10/30/2017 1.9* 2.2 - 3.9 g/dL Corrected  . A/G Ratio 10/30/2017 1.6  0.7 - 1.7 Corrected  . Total Protein ELP 10/30/2017 5.1* 6.0 - 8.5 g/dL Final  . IgG (Immunoglobin G), Serum 10/30/2017 202* 700 - 1,600 mg/dL Final   Result confirmed on concentration.  . IgA 10/30/2017 29* 61 - 437 mg/dL Final   Result confirmed on concentration.  . IgM (Immunoglobulin M), Srm 10/30/2017 16* 20 - 172 mg/dL Final   Comment: (NOTE) Result confirmed on concentration. Performed At: [facility] [place], [place] [id number] Rush Farmer MD [id number]   . Immunofixation Result, Serum 10/30/2017 Comment   Corrected   Comment: (NOTE) All immunoglobulins appear decreased. Pattern suggestive of hypogammaglobulinemia.        Ardath Sax, MD

## 2017-11-11 ENCOUNTER — Inpatient Hospital Stay (HOSPITAL_COMMUNITY): Payer: Medicare Other

## 2017-11-11 ENCOUNTER — Other Ambulatory Visit (HOSPITAL_COMMUNITY): Payer: Medicare Other

## 2017-11-11 DIAGNOSIS — D649 Anemia, unspecified: Secondary | ICD-10-CM | POA: Diagnosis not present

## 2017-11-11 DIAGNOSIS — N289 Disorder of kidney and ureter, unspecified: Secondary | ICD-10-CM | POA: Diagnosis not present

## 2017-11-11 DIAGNOSIS — C9000 Multiple myeloma not having achieved remission: Secondary | ICD-10-CM | POA: Diagnosis not present

## 2017-11-11 DIAGNOSIS — E875 Hyperkalemia: Secondary | ICD-10-CM | POA: Diagnosis not present

## 2017-11-11 DIAGNOSIS — D696 Thrombocytopenia, unspecified: Secondary | ICD-10-CM | POA: Diagnosis not present

## 2017-11-11 LAB — COMPREHENSIVE METABOLIC PANEL
ALK PHOS: 184 U/L — AB (ref 38–126)
ALT: 19 U/L (ref 17–63)
AST: 15 U/L (ref 15–41)
Albumin: 3.5 g/dL (ref 3.5–5.0)
Anion gap: 13 (ref 5–15)
BUN: 40 mg/dL — AB (ref 6–20)
CALCIUM: 8.3 mg/dL — AB (ref 8.9–10.3)
CHLORIDE: 103 mmol/L (ref 101–111)
CO2: 21 mmol/L — ABNORMAL LOW (ref 22–32)
CREATININE: 2.78 mg/dL — AB (ref 0.61–1.24)
GFR calc non Af Amer: 22 mL/min — ABNORMAL LOW (ref >60)
GFR, EST AFRICAN AMERICAN: 26 mL/min — AB (ref >60)
Glucose, Bld: 201 mg/dL — ABNORMAL HIGH (ref 65–99)
Potassium: 4.9 mmol/L (ref 3.5–5.1)
Sodium: 137 mmol/L (ref 135–145)
Total Bilirubin: 0.5 mg/dL (ref 0.3–1.2)
Total Protein: 6 g/dL — ABNORMAL LOW (ref 6.5–8.1)

## 2017-11-11 LAB — PHOSPHORUS: Phosphorus: 4.1 mg/dL (ref 2.5–4.6)

## 2017-11-11 LAB — CBC WITH DIFFERENTIAL/PLATELET
BASOS ABS: 0.1 10*3/uL (ref 0.0–0.1)
Basophils Relative: 2 %
EOS PCT: 1 %
Eosinophils Absolute: 0.1 10*3/uL (ref 0.0–0.7)
HCT: 24.3 % — ABNORMAL LOW (ref 39.0–52.0)
HEMOGLOBIN: 7.5 g/dL — AB (ref 13.0–17.0)
LYMPHS ABS: 1.2 10*3/uL (ref 0.7–4.0)
LYMPHS PCT: 20 %
MCH: 30.2 pg (ref 26.0–34.0)
MCHC: 30.9 g/dL (ref 30.0–36.0)
MCV: 98 fL (ref 78.0–100.0)
Monocytes Absolute: 0.5 10*3/uL (ref 0.1–1.0)
Monocytes Relative: 9 %
NEUTROS ABS: 3.9 10*3/uL (ref 1.7–7.7)
NEUTROS PCT: 68 %
PLATELETS: 151 10*3/uL (ref 150–400)
RBC: 2.48 MIL/uL — AB (ref 4.22–5.81)
RDW: 18.5 % — ABNORMAL HIGH (ref 11.5–15.5)
WBC: 5.7 10*3/uL (ref 4.0–10.5)

## 2017-11-11 LAB — IMMUNOFIXATION ELECTROPHORESIS
IgA: 32 mg/dL — ABNORMAL LOW (ref 61–437)
IgG (Immunoglobin G), Serum: 215 mg/dL — ABNORMAL LOW (ref 700–1600)
IgM (Immunoglobulin M), Srm: 16 mg/dL — ABNORMAL LOW (ref 20–172)
Total Protein ELP: 5.4 g/dL — ABNORMAL LOW (ref 6.0–8.5)

## 2017-11-11 LAB — PROTEIN ELECTROPHORESIS, SERUM
A/G Ratio: 1.6 (ref 0.7–1.7)
Albumin ELP: 3.3 g/dL (ref 2.9–4.4)
Alpha-1-Globulin: 0.3 g/dL (ref 0.0–0.4)
Alpha-2-Globulin: 0.8 g/dL (ref 0.4–1.0)
BETA GLOBULIN: 0.8 g/dL (ref 0.7–1.3)
GAMMA GLOBULIN: 0.2 g/dL — AB (ref 0.4–1.8)
Globulin, Total: 2.1 g/dL — ABNORMAL LOW (ref 2.2–3.9)
Total Protein ELP: 5.4 g/dL — ABNORMAL LOW (ref 6.0–8.5)

## 2017-11-11 LAB — MAGNESIUM: Magnesium: 1.5 mg/dL — ABNORMAL LOW (ref 1.7–2.4)

## 2017-11-11 LAB — SAMPLE TO BLOOD BANK

## 2017-11-11 NOTE — Progress Notes (Signed)
Labs, including Hgb 7.5, reviewed with Dr. Lebron Conners and pt's Velcade held for today and pt to return next Monday for repeat labs with possible Velcade inj and/or blood transfusion per MD orders. Pt asymptomatic so blood transfusion cancelled for tomorrow as well per MD orders.Pt to continue his Revlimid per MD which he has been taking as prescribed without any issues. Reviewed this information with the pt who verbalized understanding. Pt discharged self ambulatory in satisfactory condition

## 2017-11-12 ENCOUNTER — Encounter (HOSPITAL_COMMUNITY): Payer: Medicare Other

## 2017-11-12 ENCOUNTER — Other Ambulatory Visit (HOSPITAL_COMMUNITY): Payer: Self-pay | Admitting: Emergency Medicine

## 2017-11-12 LAB — KAPPA/LAMBDA LIGHT CHAINS
KAPPA FREE LGHT CHN: 29.9 mg/L — AB (ref 3.3–19.4)
Kappa, lambda light chain ratio: 2.3 — ABNORMAL HIGH (ref 0.26–1.65)
Lambda free light chains: 13 mg/L (ref 5.7–26.3)

## 2017-11-12 LAB — IGG, IGA, IGM
IGA: 38 mg/dL — AB (ref 61–437)
IGG (IMMUNOGLOBIN G), SERUM: 227 mg/dL — AB (ref 700–1600)
IGM (IMMUNOGLOBULIN M), SRM: 16 mg/dL — AB (ref 20–172)

## 2017-11-12 LAB — BETA 2 MICROGLOBULIN, SERUM: BETA 2 MICROGLOBULIN: 7.2 mg/L — AB (ref 0.6–2.4)

## 2017-11-14 ENCOUNTER — Inpatient Hospital Stay (HOSPITAL_COMMUNITY): Payer: Medicare Other

## 2017-11-14 ENCOUNTER — Other Ambulatory Visit (HOSPITAL_COMMUNITY): Payer: Self-pay | Admitting: *Deleted

## 2017-11-18 ENCOUNTER — Inpatient Hospital Stay (HOSPITAL_COMMUNITY): Payer: Medicare Other

## 2017-11-18 ENCOUNTER — Other Ambulatory Visit: Payer: Self-pay

## 2017-11-18 ENCOUNTER — Encounter (HOSPITAL_COMMUNITY): Payer: Self-pay

## 2017-11-18 ENCOUNTER — Encounter: Payer: Self-pay | Admitting: Oncology

## 2017-11-18 ENCOUNTER — Other Ambulatory Visit (HOSPITAL_COMMUNITY): Payer: Medicare Other

## 2017-11-18 DIAGNOSIS — C9000 Multiple myeloma not having achieved remission: Secondary | ICD-10-CM

## 2017-11-18 DIAGNOSIS — N289 Disorder of kidney and ureter, unspecified: Secondary | ICD-10-CM | POA: Diagnosis not present

## 2017-11-18 DIAGNOSIS — D649 Anemia, unspecified: Secondary | ICD-10-CM | POA: Diagnosis not present

## 2017-11-18 DIAGNOSIS — D696 Thrombocytopenia, unspecified: Secondary | ICD-10-CM | POA: Diagnosis not present

## 2017-11-18 DIAGNOSIS — E875 Hyperkalemia: Secondary | ICD-10-CM | POA: Diagnosis not present

## 2017-11-18 LAB — COMPREHENSIVE METABOLIC PANEL
ALT: 19 U/L (ref 17–63)
ANION GAP: 9 (ref 5–15)
AST: 15 U/L (ref 15–41)
Albumin: 3.8 g/dL (ref 3.5–5.0)
Alkaline Phosphatase: 169 U/L — ABNORMAL HIGH (ref 38–126)
BUN: 38 mg/dL — ABNORMAL HIGH (ref 6–20)
CHLORIDE: 102 mmol/L (ref 101–111)
CO2: 25 mmol/L (ref 22–32)
Calcium: 8.5 mg/dL — ABNORMAL LOW (ref 8.9–10.3)
Creatinine, Ser: 2.86 mg/dL — ABNORMAL HIGH (ref 0.61–1.24)
GFR calc non Af Amer: 21 mL/min — ABNORMAL LOW (ref >60)
GFR, EST AFRICAN AMERICAN: 25 mL/min — AB (ref >60)
Glucose, Bld: 191 mg/dL — ABNORMAL HIGH (ref 65–99)
POTASSIUM: 4.4 mmol/L (ref 3.5–5.1)
SODIUM: 136 mmol/L (ref 135–145)
Total Bilirubin: 0.9 mg/dL (ref 0.3–1.2)
Total Protein: 6.1 g/dL — ABNORMAL LOW (ref 6.5–8.1)

## 2017-11-18 LAB — CBC WITH DIFFERENTIAL/PLATELET
BASOS PCT: 2 %
Basophils Absolute: 0.1 10*3/uL (ref 0.0–0.1)
EOS ABS: 0.1 10*3/uL (ref 0.0–0.7)
EOS PCT: 2 %
HCT: 23.5 % — ABNORMAL LOW (ref 39.0–52.0)
HEMOGLOBIN: 7.5 g/dL — AB (ref 13.0–17.0)
LYMPHS PCT: 11 %
Lymphs Abs: 0.6 10*3/uL — ABNORMAL LOW (ref 0.7–4.0)
MCH: 31 pg (ref 26.0–34.0)
MCHC: 31.9 g/dL (ref 30.0–36.0)
MCV: 97.1 fL (ref 78.0–100.0)
MONOS PCT: 5 %
Monocytes Absolute: 0.3 10*3/uL (ref 0.1–1.0)
NEUTROS PCT: 80 %
Neutro Abs: 4.4 10*3/uL (ref 1.7–7.7)
Platelets: 93 10*3/uL — ABNORMAL LOW (ref 150–400)
RBC: 2.42 MIL/uL — ABNORMAL LOW (ref 4.22–5.81)
RDW: 20.2 % — ABNORMAL HIGH (ref 11.5–15.5)
WBC: 5.5 10*3/uL (ref 4.0–10.5)

## 2017-11-18 LAB — SAMPLE TO BLOOD BANK

## 2017-11-18 NOTE — Progress Notes (Signed)
Labs reviewed with Tom Harp, NP.  Per NP, will hold Velcade today for platelet count of 93,000 and hgb of 7.5.  Pt notified and Cycle 3, Day 1 rescheduled for next week.  Pt denies fatigue, shortness of breath, or any other s/s r/t low hgb.  Pt states he does not feel like he needs blood this week.  Instructed pt to call the clinic if he develops any s/s r/t anemia. Discharged ambulatory in c/o family.

## 2017-11-19 ENCOUNTER — Other Ambulatory Visit (HOSPITAL_COMMUNITY): Payer: Self-pay | Admitting: Emergency Medicine

## 2017-11-19 ENCOUNTER — Encounter (HOSPITAL_COMMUNITY): Payer: Medicare Other

## 2017-11-19 DIAGNOSIS — C9000 Multiple myeloma not having achieved remission: Secondary | ICD-10-CM

## 2017-11-19 LAB — KAPPA/LAMBDA LIGHT CHAINS
KAPPA FREE LGHT CHN: 25.6 mg/L — AB (ref 3.3–19.4)
Kappa, lambda light chain ratio: 2.21 — ABNORMAL HIGH (ref 0.26–1.65)
LAMDA FREE LIGHT CHAINS: 11.6 mg/L (ref 5.7–26.3)

## 2017-11-19 LAB — IGG, IGA, IGM
IGA: 29 mg/dL — AB (ref 61–437)
IGG (IMMUNOGLOBIN G), SERUM: 212 mg/dL — AB (ref 700–1600)
IGM (IMMUNOGLOBULIN M), SRM: 14 mg/dL — AB (ref 20–172)

## 2017-11-19 MED ORDER — LENALIDOMIDE 25 MG PO CAPS: 25.0000 mg | ORAL_CAPSULE | Freq: Every day | ORAL | 0 refills | Status: DC

## 2017-11-19 NOTE — Progress Notes (Signed)
revlimid refilled

## 2017-11-20 LAB — PROTEIN ELECTROPHORESIS, SERUM
A/G RATIO SPE: 1.7 (ref 0.7–1.7)
ALPHA-1-GLOBULIN: 0.3 g/dL (ref 0.0–0.4)
ALPHA-2-GLOBULIN: 0.8 g/dL (ref 0.4–1.0)
Albumin ELP: 3.6 g/dL (ref 2.9–4.4)
Beta Globulin: 0.8 g/dL (ref 0.7–1.3)
GLOBULIN, TOTAL: 2.1 g/dL — AB (ref 2.2–3.9)
Gamma Globulin: 0.2 g/dL — ABNORMAL LOW (ref 0.4–1.8)
Total Protein ELP: 5.7 g/dL — ABNORMAL LOW (ref 6.0–8.5)

## 2017-11-20 LAB — IMMUNOFIXATION ELECTROPHORESIS
IGA: 30 mg/dL — AB (ref 61–437)
IGG (IMMUNOGLOBIN G), SERUM: 222 mg/dL — AB (ref 700–1600)
IgM (Immunoglobulin M), Srm: 14 mg/dL — ABNORMAL LOW (ref 20–172)
Total Protein ELP: 5.7 g/dL — ABNORMAL LOW (ref 6.0–8.5)

## 2017-11-21 ENCOUNTER — Inpatient Hospital Stay (HOSPITAL_COMMUNITY): Payer: Medicare Other

## 2017-11-26 ENCOUNTER — Inpatient Hospital Stay (HOSPITAL_BASED_OUTPATIENT_CLINIC_OR_DEPARTMENT_OTHER): Payer: Medicare Other | Admitting: Internal Medicine

## 2017-11-26 ENCOUNTER — Encounter (HOSPITAL_COMMUNITY): Payer: Self-pay | Admitting: Internal Medicine

## 2017-11-26 ENCOUNTER — Encounter (HOSPITAL_COMMUNITY): Payer: Self-pay

## 2017-11-26 ENCOUNTER — Inpatient Hospital Stay (HOSPITAL_COMMUNITY): Payer: Medicare Other

## 2017-11-26 ENCOUNTER — Other Ambulatory Visit: Payer: Self-pay

## 2017-11-26 DIAGNOSIS — C9000 Multiple myeloma not having achieved remission: Secondary | ICD-10-CM

## 2017-11-26 DIAGNOSIS — D649 Anemia, unspecified: Secondary | ICD-10-CM

## 2017-11-26 DIAGNOSIS — N289 Disorder of kidney and ureter, unspecified: Secondary | ICD-10-CM

## 2017-11-26 DIAGNOSIS — E875 Hyperkalemia: Secondary | ICD-10-CM

## 2017-11-26 DIAGNOSIS — D696 Thrombocytopenia, unspecified: Secondary | ICD-10-CM

## 2017-11-26 LAB — CBC WITH DIFFERENTIAL/PLATELET
Basophils Absolute: 0 10*3/uL (ref 0.0–0.1)
Basophils Relative: 0 %
EOS PCT: 0 %
Eosinophils Absolute: 0 10*3/uL (ref 0.0–0.7)
HCT: 22 % — ABNORMAL LOW (ref 39.0–52.0)
Hemoglobin: 7.3 g/dL — ABNORMAL LOW (ref 13.0–17.0)
LYMPHS ABS: 0.5 10*3/uL — AB (ref 0.7–4.0)
LYMPHS PCT: 10 %
MCH: 31.7 pg (ref 26.0–34.0)
MCHC: 33.2 g/dL (ref 30.0–36.0)
MCV: 95.7 fL (ref 78.0–100.0)
MONO ABS: 0.1 10*3/uL (ref 0.1–1.0)
Monocytes Relative: 3 %
Neutro Abs: 4.2 10*3/uL (ref 1.7–7.7)
Neutrophils Relative %: 87 %
PLATELETS: 44 10*3/uL — AB (ref 150–400)
RBC: 2.3 MIL/uL — AB (ref 4.22–5.81)
RDW: 20.2 % — ABNORMAL HIGH (ref 11.5–15.5)
WBC: 4.9 10*3/uL (ref 4.0–10.5)

## 2017-11-26 LAB — COMPREHENSIVE METABOLIC PANEL
ALT: 40 U/L (ref 17–63)
AST: 26 U/L (ref 15–41)
Albumin: 3.9 g/dL (ref 3.5–5.0)
Alkaline Phosphatase: 161 U/L — ABNORMAL HIGH (ref 38–126)
Anion gap: 15 (ref 5–15)
BUN: 59 mg/dL — ABNORMAL HIGH (ref 6–20)
CALCIUM: 8.4 mg/dL — AB (ref 8.9–10.3)
CHLORIDE: 99 mmol/L — AB (ref 101–111)
CO2: 19 mmol/L — ABNORMAL LOW (ref 22–32)
Creatinine, Ser: 3.07 mg/dL — ABNORMAL HIGH (ref 0.61–1.24)
GFR, EST AFRICAN AMERICAN: 23 mL/min — AB (ref >60)
GFR, EST NON AFRICAN AMERICAN: 20 mL/min — AB (ref >60)
Glucose, Bld: 326 mg/dL — ABNORMAL HIGH (ref 65–99)
Potassium: 5.2 mmol/L — ABNORMAL HIGH (ref 3.5–5.1)
Sodium: 133 mmol/L — ABNORMAL LOW (ref 135–145)
TOTAL PROTEIN: 5.9 g/dL — AB (ref 6.5–8.1)
Total Bilirubin: 1.2 mg/dL (ref 0.3–1.2)

## 2017-11-26 LAB — PREPARE RBC (CROSSMATCH)

## 2017-11-26 LAB — SAMPLE TO BLOOD BANK

## 2017-11-26 MED ORDER — SODIUM CHLORIDE 0.9 % IV SOLN
INTRAVENOUS | Status: DC
  Administered 2017-11-26: 11:00:00 via INTRAVENOUS

## 2017-11-26 MED ORDER — ACETAMINOPHEN 325 MG PO TABS
650.0000 mg | ORAL_TABLET | Freq: Once | ORAL | Status: AC
  Administered 2017-11-26: 650 mg via ORAL

## 2017-11-26 MED ORDER — DIPHENHYDRAMINE HCL 25 MG PO CAPS
25.0000 mg | ORAL_CAPSULE | Freq: Once | ORAL | Status: AC
  Administered 2017-11-26: 25 mg via ORAL

## 2017-11-26 MED ORDER — DIPHENHYDRAMINE HCL 25 MG PO CAPS
ORAL_CAPSULE | ORAL | Status: AC
  Filled 2017-11-26: qty 1

## 2017-11-26 MED ORDER — SODIUM CHLORIDE 0.9 % IV SOLN
250.0000 mL | Freq: Once | INTRAVENOUS | Status: AC
  Administered 2017-11-26: 250 mL via INTRAVENOUS

## 2017-11-26 MED ORDER — LENALIDOMIDE 25 MG PO CAPS: 25.0000 mg | ORAL_CAPSULE | Freq: Every day | ORAL | 0 refills | Status: DC

## 2017-11-26 MED ORDER — ACETAMINOPHEN 325 MG PO TABS
ORAL_TABLET | ORAL | Status: AC
  Filled 2017-11-26: qty 2

## 2017-11-26 NOTE — Progress Notes (Signed)
Per Dr. Walden Field - will hold Velcade today.  Pt is also to hold Revlimid/Dex until futher notice.  Weekly CBCs.  Pt to received 1 unit PRBC today.   Tolerated transfusion and IVF w/o adverse reaction.  Alert, in no distress.  VSS.  Discharged ambulatory in c/o sister.

## 2017-11-26 NOTE — Progress Notes (Signed)
[facility] Follow up:    Tom Noble, MD [address] [place] [address]   Assessment and plan:  1.  MM, IgA Kappa.  He is being treated with Revlimid Velcade and low-dose Decadron under the direction of Dr. Lebron Conners.  The patient is here for cycle 3 of therapy.  It was noted that his hemoglobin was 7.3 and platelets were 44,000.  He is advised to hold his Revlimid and Velcade will also not be administered today.  He will return to clinic in 1 week for repeat labs and will be transfused today with I unit packed red cells.  After further discussion he indicated he now would be willing to undergo transplant evaluation but prefers to be seen at [facility].  Will refer the patient to [facility] for evaluation.  He is advised to notify the office if he has any significant issues prior to his next visit.  2.  Anemia.  Hemoglobin is 7.3.  He is advised to hold Revlimid and will also hold Velcade.  He is transfused today.  3.  Thrombocytopenia.  His platelet count is 44,000 today.  He will hold Revlimid and Velcade will not be administered.  He will return to clinic in 1 week for repeat labs.  4.  Next, renal insufficiency.  His creatinine is 3.  This is likely secondary to myeloma.  He will receive IV fluids today in clinic.  His potassium was also noted to be slightly elevated at 5.2.  5.  Hyperkalemia.  Potassium is noted to be slightly elevated at 5.2.  His creatinine is elevated at 3 and is likely the etiology of the mild elevation in.  He is given IV fluids today in clinic.  Interval history: 67 y.o. male with diagnosis of active multiple myeloma with IgA kappa.  He is being treated with Revlimid, bortezomib, and low-dose dexamethasone under the direction of Dr. Lebron Conners.  Previous cycle of therapy complicated by grade 3 anemia necessitating holding of bortezomib per Dr. Lebron Conners.    Current status: The patient is seen today for follow-up.  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  He is here today for cycle 3-day 1 of Velcade Decadron and Revlimid.  He  initially expressed he did not desire to be seen for transplant evaluation.  He reports occasional blood in his stool.    DIAGNOSIS: Cancer Staging No matching staging information was found for the patient.  SUMMARY OF ONCOLOGIC HISTORY: Reviewed   CURRENT THERAPY: Revlimid and Velcade and Decadron.  Patient Active Problem List   Diagnosis Date Noted  . Multiple myeloma ([place]) 08/29/2017  . CKD (chronic kidney disease) 08/29/2017  . Symptomatic anemia   . 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 AKI (acute kidney injury) ([place]) 07/24/2017  . Closed fracture of transverse process of lumbar vertebra, with delayed healing, subsequent encounter 07/24/2017  . Renal failure 07/23/2017  . Hypercalcemia 07/23/2017  . Anemia 07/23/2017  . Thrombocytopenia ([place]) 07/23/2017  . Diabetes mellitus type 2 in obese ([place]) 07/23/2017   NKDA  MEDICAL HISTORY: Past Medical History:  Diagnosis Date  . Cancer ([place])    multiple myeloma  . Cancer of right kidney ([place])   . Cervical dystonia   . Diabetes mellitus without complication ([place])     SURGICAL HISTORY: Past Surgical History:  Procedure Laterality Date  . CHOLECYSTECTOMY  2007  . NEPHRECTOMY Right 1998   cancer    SOCIAL HISTORY: Social History  Socioeconomic History  . Marital status: Single    Spouse name: Not on file  . Number of children: Not on file  . Years of education: Not on file  . Highest education level: Not on file  Social Needs  . Financial resource strain: Not on file  . Food insecurity - worry: Not on file  . Food insecurity - inability: Not on file  . Transportation needs - medical: Not on file  . Transportation needs - non-medical: Not on file  Occupational History  . Occupation: Marine scientist, Insurance underwriter  Tobacco Use  . Smoking status: Never Smoker  . Smokeless tobacco: Current User    Types:  Chew  Substance and Sexual Activity  . Alcohol use: No  . Drug use: No  . Sexual activity: Not on file  Other Topics Concern  . Not on file  Social History Narrative  . Not on file    FAMILY HISTORY: Family History  Problem Relation Age of Onset  . Heart failure Mother 65  . Dementia Father     Prior to Admission medications   Medication Sig Start Date End Date Taking? Authorizing Provider  acyclovir (ZOVIRAX) 400 MG tablet Take 1 tablet (400 mg total) 2 (two) times daily by mouth. 09/05/17   Twana First, MD  allopurinol (ZYLOPRIM) 300 MG tablet Take 300 mg by mouth every morning.    [provider]  aspirin EC 81 MG tablet Take 81 mg daily by mouth.    [provider]  cyclobenzaprine (FLEXERIL) 10 MG tablet Take 10 mg by mouth at bedtime.    [provider]  dexamethasone (DECADRON) 4 MG tablet Take 10 tablets (40 mg) on days 1, 8, and 15 of chemo. Repeat every 21 days. 09/23/17   Twana First, MD  doxycycline (VIBRA-TABS) 100 MG tablet Take 1 tablet by mouth 2 (two) times daily. 09/27/17   [provider]  furosemide (LASIX) 40 MG tablet  10/28/17   [provider]  glipiZIDE (GLIPIZIDE XL) 2.5 MG 24 hr tablet Take 1 tablet (2.5 mg total) by mouth daily with breakfast. 07/31/17   Kathie Dike, MD  Ibuprofen-Diphenhydramine Cit (ADVIL PM PO) Take by mouth.    [provider]  lenalidomide (REVLIMID) 25 MG capsule Take 1 capsule (25 mg total) by mouth daily. Take 1 capsule on days 1-14. Repeat every 21 days. 11/26/17   Karess Harner, Mathis Dad, MD  Multiple Vitamins-Minerals (CENTRUM SILVER 50+MEN) TABS Take 1 tablet by mouth every morning.    [provider]  pravastatin (PRAVACHOL) 20 MG tablet Take 20 mg by mouth every morning.    [provider]  sodium bicarbonate 650 MG tablet Take 1 tablet (650 mg total) by mouth 2 (two) times daily. Patient taking differently: Take 650 mg by mouth 3 (three) times daily.  07/31/17    Kathie Dike, MD  traMADol (ULTRAM) 50 MG tablet Take 1 tablet (50 mg total) every 6 (six) hours as needed by mouth. 09/12/17   Holley Bouche, NP  prochlorperazine (COMPAZINE) 10 MG tablet Take 1 tablet (10 mg total) by mouth every 6 (six) hours as needed (Nausea or vomiting). 08/29/17 09/05/17  Twana First, MD    Review of Systems - Oncology Negative other than leg swelling.  He reports neuropathy is improving.     PHYSICAL EXAMINATION  ECOG PERFORMANCE STATUS: 1  Vitals:   11/26/17 0900  BP: (!) 142/62  Pulse: 86  Resp: 18  Temp: 98.2 F (36.8 C)  SpO2:  98%    Physical Exam  Constitutional: He is oriented to person, place, and time. He appears well-developed and well-nourished.  HENT:  Head: Normocephalic.  Eyes: EOM are normal. Pupils are equal, round, and reactive to light.  Neck: Neck supple.  Cardiovascular: Regular rhythm.  Pulmonary/Chest: Breath sounds normal.  Abdominal: Soft. Bowel sounds are normal.  Musculoskeletal: Normal range of motion.  Neurological: He is alert and oriented to person, place, and time.  Skin: Skin is warm.  Psychiatric: He has a normal mood and affect.  Ext:  Trace LE edema  LABORATORY DATA:  CBC    Component Value Date/Time   WBC 4.9 11/26/2017 0841   RBC 2.30 (L) 11/26/2017 0841   HGB 7.3 (L) 11/26/2017 0841   HCT 22.0 (L) 11/26/2017 0841   PLT 44 (L) 11/26/2017 0841   MCV 95.7 11/26/2017 0841   MCH 31.7 11/26/2017 0841   MCHC 33.2 11/26/2017 0841   RDW 20.2 (H) 11/26/2017 0841   LYMPHSABS 0.5 (L) 11/26/2017 0841   MONOABS 0.1 11/26/2017 0841   EOSABS 0.0 11/26/2017 0841   BASOSABS 0.0 11/26/2017 0841    CMP     Component Value Date/Time   NA 133 (L) 11/26/2017 0841   K 5.2 (H) 11/26/2017 0841   CL 99 (L) 11/26/2017 0841   CO2 19 (L) 11/26/2017 0841   GLUCOSE 326 (H) 11/26/2017 0841   BUN 59 (H) 11/26/2017 0841   CREATININE 3.07 (H) 11/26/2017 0841   CALCIUM 8.4 (L) 11/26/2017 0841   CALCIUM 10.7 (H)  07/25/2017 0447   PROT 5.9 (L) 11/26/2017 0841   ALBUMIN 3.9 11/26/2017 0841   AST 26 11/26/2017 0841   ALT 40 11/26/2017 0841   ALKPHOS 161 (H) 11/26/2017 0841   BILITOT 1.2 11/26/2017 0841   GFRNONAA 20 (L) 11/26/2017 0841   GFRAA 23 (L) 11/26/2017 0841    ASSESSMENT and THERAPY PLAN: See above  Orders Placed This Encounter  Procedures  . CBC with Differential    Standing Status:   Standing    Number of Occurrences:   2    Standing Expiration Date:   11/26/2018    All questions were answered. The patient knows to call the clinic with any problems, questions or concerns. We can certainly see the patient much sooner if necessary. This note was electronically signed. Zoila Shutter, MD 11/26/2017

## 2017-11-26 NOTE — Patient Instructions (Signed)
[place] at [facility] Discharge Instructions  RECOMMENDATIONS MADE BY THE CONSULTANT AND ANY TEST RESULTS WILL BE SENT TO YOUR REFERRING PHYSICIAN.   Do not take Revlimid or Dexamethasone until we tell you to restart it.  Weekly labs.   You will see Dr. Walden Field in 2 weeks    Thank you for choosing [place] at [facility] to provide your oncology and hematology care.  To afford each patient quality time with our provider, please arrive at least 15 minutes before your scheduled appointment time.    If you have a lab appointment with the [facility] please come in thru the  Main Entrance and check in at the main information desk  You need to re-schedule your appointment should you arrive 10 or more minutes late.  We strive to give you quality time with our providers, and arriving late affects you and other patients whose appointments are after yours.  Also, if you no show three or more times for appointments you may be dismissed from the clinic at the providers discretion.     Again, thank you for choosing [facility].  Our hope is that these requests will decrease the amount of time that you wait before being seen by our physicians.       _____________________________________________________________  Should you have questions after your visit to [facility], please contact our office at [phone] between the hours of 8:30 a.m. and 4:30 p.m.  Voicemails left after 4:30 p.m. will not be returned until the following business day.  For prescription refill requests, have your pharmacy contact our office.       Resources For Cancer Patients and their Caregivers ? American Cancer Society: Can assist with transportation, wigs, general needs, runs Look Good Feel Better.        475-368-3175 ? Cancer Care: Provides financial assistance, online support groups, medication/co-pay assistance.  1-800-813-HOPE  512 567 8295) ? [facility] Assists [place] cancer patients and their families through emotional , educational and financial support.  (604) 635-0963 ? [place] DSS Where to apply for food stamps, Medicaid and utility assistance. 3201419559 ? [employer]: Transportation to medical appointments. (708)693-6113 ? Social Security Administration: May apply for disability if have a Stage IV cancer. (504)404-2201 210-529-4276 ? [employer], Disability and Transit Services: Assists with nutrition, care and transit needs. [place] Support Programs: @10RELATIVEDAYS @ > Cancer Support Group  2nd Tuesday of the month 1pm-2pm, Journey Room  > Creative Journey  3rd Tuesday of the month 1130am-1pm, Journey Room  > Look Good Feel Better  1st Wednesday of the month 10am-12 noon, Journey Room (Call [place] to register [phone])

## 2017-11-26 NOTE — Progress Notes (Signed)
Referred patient for consult at [facility] for possible BMT.  Faxed patient records to Attn: [facility] [phone].  Per [facility], physician will review records and advise when and with which provider to schedule.  They will contact us with appointment date and time.

## 2017-11-27 LAB — TYPE AND SCREEN
ABO/RH(D): A POS
Antibody Screen: NEGATIVE
UNIT DIVISION: 0

## 2017-11-27 LAB — BPAM RBC
BLOOD PRODUCT EXPIRATION DATE: 201902162359
ISSUE DATE / TIME: 201901291211
UNIT TYPE AND RH: 6200

## 2017-11-28 ENCOUNTER — Encounter (HOSPITAL_COMMUNITY): Payer: Medicare Other

## 2017-11-29 ENCOUNTER — Inpatient Hospital Stay (HOSPITAL_COMMUNITY): Payer: Medicare Other

## 2017-12-02 ENCOUNTER — Other Ambulatory Visit (HOSPITAL_COMMUNITY): Payer: Self-pay | Admitting: *Deleted

## 2017-12-02 DIAGNOSIS — C9000 Multiple myeloma not having achieved remission: Secondary | ICD-10-CM | POA: Diagnosis not present

## 2017-12-03 ENCOUNTER — Inpatient Hospital Stay (HOSPITAL_COMMUNITY): Payer: Medicare Other

## 2017-12-03 ENCOUNTER — Inpatient Hospital Stay (HOSPITAL_COMMUNITY): Payer: Medicare Other | Attending: Oncology

## 2017-12-03 DIAGNOSIS — Z79899 Other long term (current) drug therapy: Secondary | ICD-10-CM | POA: Insufficient documentation

## 2017-12-03 DIAGNOSIS — C9000 Multiple myeloma not having achieved remission: Secondary | ICD-10-CM | POA: Diagnosis not present

## 2017-12-03 DIAGNOSIS — N189 Chronic kidney disease, unspecified: Secondary | ICD-10-CM | POA: Diagnosis not present

## 2017-12-03 DIAGNOSIS — Z5111 Encounter for antineoplastic chemotherapy: Secondary | ICD-10-CM | POA: Diagnosis not present

## 2017-12-03 DIAGNOSIS — D649 Anemia, unspecified: Secondary | ICD-10-CM | POA: Insufficient documentation

## 2017-12-03 DIAGNOSIS — K625 Hemorrhage of anus and rectum: Secondary | ICD-10-CM | POA: Diagnosis not present

## 2017-12-03 DIAGNOSIS — E875 Hyperkalemia: Secondary | ICD-10-CM | POA: Diagnosis not present

## 2017-12-03 DIAGNOSIS — D696 Thrombocytopenia, unspecified: Secondary | ICD-10-CM | POA: Insufficient documentation

## 2017-12-03 DIAGNOSIS — E1122 Type 2 diabetes mellitus with diabetic chronic kidney disease: Secondary | ICD-10-CM | POA: Diagnosis not present

## 2017-12-03 LAB — CBC WITH DIFFERENTIAL/PLATELET
Basophils Absolute: 0 10*3/uL (ref 0.0–0.1)
Basophils Relative: 0 %
EOS PCT: 0 %
Eosinophils Absolute: 0 10*3/uL (ref 0.0–0.7)
HCT: 21.9 % — ABNORMAL LOW (ref 39.0–52.0)
Hemoglobin: 7.3 g/dL — ABNORMAL LOW (ref 13.0–17.0)
LYMPHS ABS: 0.4 10*3/uL — AB (ref 0.7–4.0)
LYMPHS PCT: 13 %
MCH: 32 pg (ref 26.0–34.0)
MCHC: 33.3 g/dL (ref 30.0–36.0)
MCV: 96.1 fL (ref 78.0–100.0)
MONO ABS: 0.3 10*3/uL (ref 0.1–1.0)
MONOS PCT: 10 %
Neutro Abs: 2.5 10*3/uL (ref 1.7–7.7)
Neutrophils Relative %: 77 %
PLATELETS: 59 10*3/uL — AB (ref 150–400)
RBC: 2.28 MIL/uL — AB (ref 4.22–5.81)
RDW: 19.6 % — ABNORMAL HIGH (ref 11.5–15.5)
WBC: 3.3 10*3/uL — AB (ref 4.0–10.5)

## 2017-12-03 LAB — SAMPLE TO BLOOD BANK

## 2017-12-03 LAB — COMPREHENSIVE METABOLIC PANEL
ALT: 22 U/L (ref 17–63)
AST: 22 U/L (ref 15–41)
Albumin: 3.9 g/dL (ref 3.5–5.0)
Alkaline Phosphatase: 125 U/L (ref 38–126)
Anion gap: 13 (ref 5–15)
BUN: 59 mg/dL — ABNORMAL HIGH (ref 6–20)
CALCIUM: 8.3 mg/dL — AB (ref 8.9–10.3)
CHLORIDE: 101 mmol/L (ref 101–111)
CO2: 19 mmol/L — ABNORMAL LOW (ref 22–32)
CREATININE: 2.93 mg/dL — AB (ref 0.61–1.24)
GFR, EST AFRICAN AMERICAN: 24 mL/min — AB (ref >60)
GFR, EST NON AFRICAN AMERICAN: 21 mL/min — AB (ref >60)
Glucose, Bld: 374 mg/dL — ABNORMAL HIGH (ref 65–99)
Potassium: 4.6 mmol/L (ref 3.5–5.1)
Sodium: 133 mmol/L — ABNORMAL LOW (ref 135–145)
Total Bilirubin: 0.7 mg/dL (ref 0.3–1.2)
Total Protein: 6.1 g/dL — ABNORMAL LOW (ref 6.5–8.1)

## 2017-12-03 NOTE — Progress Notes (Signed)
Lab results reviewed with Faythe Casa NP and Velcade injection to be held today and pt is to continue to hold his Revlimid per NP orders. Discussed this information and lab results with the pt who denied any SOB,chest pain,increase in fatigue or any active bleeding or at this time. He did report starting back on his Revlimid yesterday but he will now hold it until repeat labs next week. He also has an appt at [facility] this Thursday. Pt discharged self ambulatory in stable condition

## 2017-12-04 ENCOUNTER — Encounter: Payer: Medicare Other | Admitting: Vascular Surgery

## 2017-12-04 ENCOUNTER — Encounter (HOSPITAL_COMMUNITY): Payer: Medicare Other

## 2017-12-04 ENCOUNTER — Other Ambulatory Visit (HOSPITAL_COMMUNITY): Payer: Medicare Other

## 2017-12-05 DIAGNOSIS — C9000 Multiple myeloma not having achieved remission: Secondary | ICD-10-CM | POA: Diagnosis not present

## 2017-12-06 ENCOUNTER — Inpatient Hospital Stay (HOSPITAL_COMMUNITY): Payer: Medicare Other

## 2017-12-09 ENCOUNTER — Other Ambulatory Visit (HOSPITAL_COMMUNITY): Payer: Self-pay | Admitting: *Deleted

## 2017-12-09 DIAGNOSIS — D696 Thrombocytopenia, unspecified: Secondary | ICD-10-CM

## 2017-12-09 DIAGNOSIS — C9000 Multiple myeloma not having achieved remission: Secondary | ICD-10-CM

## 2017-12-10 ENCOUNTER — Other Ambulatory Visit (HOSPITAL_COMMUNITY): Payer: Self-pay | Admitting: Adult Health

## 2017-12-10 ENCOUNTER — Encounter (HOSPITAL_COMMUNITY): Payer: Self-pay | Admitting: Adult Health

## 2017-12-10 ENCOUNTER — Other Ambulatory Visit: Payer: Self-pay

## 2017-12-10 ENCOUNTER — Inpatient Hospital Stay (HOSPITAL_COMMUNITY): Payer: Medicare Other

## 2017-12-10 ENCOUNTER — Other Ambulatory Visit (HOSPITAL_COMMUNITY): Payer: Self-pay | Admitting: Emergency Medicine

## 2017-12-10 ENCOUNTER — Encounter (HOSPITAL_COMMUNITY): Payer: Self-pay

## 2017-12-10 VITALS — BP 142/62 | HR 81 | Temp 98.3°F | Resp 18 | Wt 206.0 lb

## 2017-12-10 DIAGNOSIS — E875 Hyperkalemia: Secondary | ICD-10-CM | POA: Diagnosis not present

## 2017-12-10 DIAGNOSIS — D649 Anemia, unspecified: Secondary | ICD-10-CM | POA: Diagnosis not present

## 2017-12-10 DIAGNOSIS — C9000 Multiple myeloma not having achieved remission: Secondary | ICD-10-CM

## 2017-12-10 DIAGNOSIS — D696 Thrombocytopenia, unspecified: Secondary | ICD-10-CM

## 2017-12-10 DIAGNOSIS — K625 Hemorrhage of anus and rectum: Secondary | ICD-10-CM | POA: Diagnosis not present

## 2017-12-10 DIAGNOSIS — Z5111 Encounter for antineoplastic chemotherapy: Secondary | ICD-10-CM | POA: Diagnosis not present

## 2017-12-10 LAB — COMPREHENSIVE METABOLIC PANEL
ALBUMIN: 3.8 g/dL (ref 3.5–5.0)
ALT: 21 U/L (ref 17–63)
AST: 14 U/L — AB (ref 15–41)
Alkaline Phosphatase: 145 U/L — ABNORMAL HIGH (ref 38–126)
Anion gap: 10 (ref 5–15)
BUN: 40 mg/dL — AB (ref 6–20)
CHLORIDE: 104 mmol/L (ref 101–111)
CO2: 24 mmol/L (ref 22–32)
Calcium: 8.7 mg/dL — ABNORMAL LOW (ref 8.9–10.3)
Creatinine, Ser: 2.69 mg/dL — ABNORMAL HIGH (ref 0.61–1.24)
GFR calc Af Amer: 27 mL/min — ABNORMAL LOW (ref >60)
GFR calc non Af Amer: 23 mL/min — ABNORMAL LOW (ref >60)
GLUCOSE: 192 mg/dL — AB (ref 65–99)
Potassium: 4.1 mmol/L (ref 3.5–5.1)
SODIUM: 138 mmol/L (ref 135–145)
Total Bilirubin: 0.8 mg/dL (ref 0.3–1.2)
Total Protein: 6.1 g/dL — ABNORMAL LOW (ref 6.5–8.1)

## 2017-12-10 LAB — CBC WITH DIFFERENTIAL/PLATELET
BASOS ABS: 0 10*3/uL (ref 0.0–0.1)
BASOS PCT: 1 %
EOS PCT: 5 %
Eosinophils Absolute: 0.1 10*3/uL (ref 0.0–0.7)
HCT: 22.4 % — ABNORMAL LOW (ref 39.0–52.0)
Hemoglobin: 7.5 g/dL — ABNORMAL LOW (ref 13.0–17.0)
Lymphocytes Relative: 34 %
Lymphs Abs: 0.8 10*3/uL (ref 0.7–4.0)
MCH: 32.9 pg (ref 26.0–34.0)
MCHC: 33.5 g/dL (ref 30.0–36.0)
MCV: 98.2 fL (ref 78.0–100.0)
MONO ABS: 0.3 10*3/uL (ref 0.1–1.0)
Monocytes Relative: 12 %
Neutro Abs: 1.2 10*3/uL (ref 1.7–7.7)
Neutrophils Relative %: 48 %
PLATELETS: 95 10*3/uL — AB (ref 150–400)
RBC: 2.28 MIL/uL — AB (ref 4.22–5.81)
RDW: 20.1 % — AB (ref 11.5–15.5)
WBC: 2.4 10*3/uL — AB (ref 4.0–10.5)

## 2017-12-10 LAB — SAMPLE TO BLOOD BANK

## 2017-12-10 MED ORDER — PROCHLORPERAZINE MALEATE 10 MG PO TABS
10.0000 mg | ORAL_TABLET | Freq: Once | ORAL | Status: AC
  Administered 2017-12-10: 10 mg via ORAL
  Filled 2017-12-10: qty 1

## 2017-12-10 MED ORDER — BORTEZOMIB CHEMO SQ INJECTION 3.5 MG (2.5MG/ML)
1.3000 mg/m2 | Freq: Once | INTRAMUSCULAR | Status: AC
  Administered 2017-12-10: 2.75 mg via SUBCUTANEOUS
  Filled 2017-12-10: qty 2.75

## 2017-12-10 MED ORDER — DEXAMETHASONE 4 MG PO TABS: ORAL_TABLET | ORAL | 3 refills | Status: DC

## 2017-12-10 MED ORDER — LENALIDOMIDE 10 MG PO CAPS: ORAL_CAPSULE | ORAL | 0 refills | Status: DC

## 2017-12-10 NOTE — Progress Notes (Signed)
Labs reviewed with Dr. Walden Field - okay to tx today per MD.  Pt denies any s/s r/t anemia or thrombocytopenia.   [name] presents today for injection per the provider's orders.  Velcade administration without incident; see MAR for injection details.  Patient tolerated procedure well and without incident.  No questions or complaints noted at this time.  Discharged ambulatory in c/o sister.

## 2017-12-10 NOTE — Progress Notes (Signed)
Note from recent [facility] BMT visit reviewed.  They recommend dose-reduction of Revlimid to 10 mg p.o. daily on days 1-14 every 21-day cycle.  Also recommend decreasing frequency of Velcade to once a week on days 1 & 7 every 21-day cycle.  Velcade treatment plan adjusted accordingly.  We will review in more detail with patient at upcoming office visit on 12/17/17.    Mike Craze, NP [place] [phone]

## 2017-12-10 NOTE — Progress Notes (Signed)
Spoke with Karalee Height treatment coordinator at [facility] transplant.  [phone]) She faxed the the note with the new recommendations.  New revlimid 10 mg script printed and signed, given to Angie.  New dexamethasone prescription faxed to the pharmacy.  Mike Craze NP is going to edit the velcade treatment plan to match the recommendations from [facility].

## 2017-12-11 ENCOUNTER — Telehealth (HOSPITAL_COMMUNITY): Payer: Self-pay | Admitting: Emergency Medicine

## 2017-12-11 ENCOUNTER — Telehealth (HOSPITAL_COMMUNITY): Payer: Self-pay | Admitting: Adult Health

## 2017-12-11 NOTE — Telephone Encounter (Signed)
FAXED NEW REVLIMID SCRIPT TO [facility]. DOSE REDUCTION

## 2017-12-11 NOTE — Telephone Encounter (Signed)
Called pt to let him know that I had talked with [facility].  Reviewed the information with the doctors at the clinic.  Angie was given his new script for revlimid 10mg .  We went over his new velcade dose (day 1 and 8 every 21 days).  Went over his new dexamethasone dose (day 1 and 8 every 21 days take 10 tablets).  He verbalized understanding.

## 2017-12-17 ENCOUNTER — Encounter: Payer: Self-pay | Admitting: Oncology

## 2017-12-17 ENCOUNTER — Inpatient Hospital Stay (HOSPITAL_BASED_OUTPATIENT_CLINIC_OR_DEPARTMENT_OTHER): Payer: Medicare Other | Admitting: Oncology

## 2017-12-17 ENCOUNTER — Encounter (HOSPITAL_COMMUNITY): Payer: Self-pay | Admitting: Oncology

## 2017-12-17 ENCOUNTER — Inpatient Hospital Stay (HOSPITAL_COMMUNITY): Payer: Medicare Other

## 2017-12-17 ENCOUNTER — Other Ambulatory Visit (HOSPITAL_COMMUNITY)
Admission: RE | Admit: 2017-12-17 | Discharge: 2017-12-17 | Disposition: A | Discharge status: Home / Self Care | Payer: Medicare Other | Source: Ambulatory Visit | Attending: Internal Medicine | Admitting: Internal Medicine

## 2017-12-17 VITALS — BP 143/65 | HR 91 | Temp 98.3°F | Resp 18 | Wt 210.0 lb

## 2017-12-17 DIAGNOSIS — D696 Thrombocytopenia, unspecified: Secondary | ICD-10-CM | POA: Diagnosis not present

## 2017-12-17 DIAGNOSIS — C9000 Multiple myeloma not having achieved remission: Secondary | ICD-10-CM

## 2017-12-17 DIAGNOSIS — E875 Hyperkalemia: Secondary | ICD-10-CM | POA: Diagnosis not present

## 2017-12-17 DIAGNOSIS — K625 Hemorrhage of anus and rectum: Secondary | ICD-10-CM | POA: Diagnosis not present

## 2017-12-17 DIAGNOSIS — Z79899 Other long term (current) drug therapy: Secondary | ICD-10-CM

## 2017-12-17 DIAGNOSIS — E119 Type 2 diabetes mellitus without complications: Secondary | ICD-10-CM | POA: Diagnosis not present

## 2017-12-17 DIAGNOSIS — Z5111 Encounter for antineoplastic chemotherapy: Secondary | ICD-10-CM | POA: Diagnosis not present

## 2017-12-17 DIAGNOSIS — D649 Anemia, unspecified: Secondary | ICD-10-CM

## 2017-12-17 LAB — COMPREHENSIVE METABOLIC PANEL
ALBUMIN: 4 g/dL (ref 3.5–5.0)
ALK PHOS: 156 U/L — AB (ref 38–126)
ALT: 17 U/L (ref 17–63)
AST: 17 U/L (ref 15–41)
Anion gap: 12 (ref 5–15)
BILIRUBIN TOTAL: 0.6 mg/dL (ref 0.3–1.2)
BUN: 51 mg/dL — AB (ref 6–20)
CALCIUM: 8.8 mg/dL — AB (ref 8.9–10.3)
CO2: 20 mmol/L — AB (ref 22–32)
CREATININE: 2.94 mg/dL — AB (ref 0.61–1.24)
Chloride: 105 mmol/L (ref 101–111)
GFR calc Af Amer: 24 mL/min — ABNORMAL LOW (ref >60)
GFR calc non Af Amer: 21 mL/min — ABNORMAL LOW (ref >60)
Glucose, Bld: 209 mg/dL — ABNORMAL HIGH (ref 65–99)
Potassium: 4.2 mmol/L (ref 3.5–5.1)
Sodium: 137 mmol/L (ref 135–145)
TOTAL PROTEIN: 6.3 g/dL — AB (ref 6.5–8.1)

## 2017-12-17 LAB — PREPARE RBC (CROSSMATCH)

## 2017-12-17 LAB — CBC WITH DIFFERENTIAL/PLATELET
Basophils Absolute: 0 10*3/uL (ref 0.0–0.1)
Basophils Relative: 0 %
EOS PCT: 0 %
Eosinophils Absolute: 0 10*3/uL (ref 0.0–0.7)
HEMATOCRIT: 21.5 % — AB (ref 39.0–52.0)
HEMOGLOBIN: 7.2 g/dL — AB (ref 13.0–17.0)
LYMPHS ABS: 0.5 10*3/uL — AB (ref 0.7–4.0)
Lymphocytes Relative: 15 %
MCH: 33 pg (ref 26.0–34.0)
MCHC: 33.5 g/dL (ref 30.0–36.0)
MCV: 98.6 fL (ref 78.0–100.0)
Monocytes Absolute: 0.4 10*3/uL (ref 0.1–1.0)
Monocytes Relative: 11 %
Neutro Abs: 2.5 10*3/uL (ref 1.7–7.7)
Neutrophils Relative %: 74 %
PLATELETS: 156 10*3/uL (ref 150–400)
RBC: 2.18 MIL/uL — AB (ref 4.22–5.81)
RDW: 19.9 % — ABNORMAL HIGH (ref 11.5–15.5)
WBC: 3.4 10*3/uL — AB (ref 4.0–10.5)

## 2017-12-17 LAB — SAMPLE TO BLOOD BANK

## 2017-12-17 LAB — HEMOGLOBIN A1C
Hgb A1c MFr Bld: 6 % — ABNORMAL HIGH (ref 4.8–5.6)
Mean Plasma Glucose: 125.5 mg/dL

## 2017-12-17 MED ORDER — BORTEZOMIB CHEMO SQ INJECTION 3.5 MG (2.5MG/ML)
1.3000 mg/m2 | Freq: Once | INTRAMUSCULAR | Status: AC
  Administered 2017-12-17: 2.75 mg via SUBCUTANEOUS
  Filled 2017-12-17: qty 2.75

## 2017-12-17 MED ORDER — PROCHLORPERAZINE MALEATE 10 MG PO TABS
ORAL_TABLET | ORAL | Status: AC
  Filled 2017-12-17: qty 1

## 2017-12-17 MED ORDER — PROCHLORPERAZINE MALEATE 10 MG PO TABS
10.0000 mg | ORAL_TABLET | Freq: Once | ORAL | Status: AC
  Administered 2017-12-17: 10 mg via ORAL

## 2017-12-17 NOTE — Progress Notes (Signed)
[facility] Follow up:    Tom Noble, MD [address] [place] [address]   Assessment and plan:   1.  MM, IgA Kappa.  Patient currently being treated with R/V/D.   He is being treated with Revlimid Velcade and low-dose Decadron under the direction of Dr. Lebron Conners. Patient's day 1 cycle 3 was held due to anemia and thrombocytopenia on 11/26/17. He was given 1 unit packed red blood cells. He was referred back to [facility] for evaluation for bone marrow transplant. He was brought back on 12/10/2017 where he received cycle 3 day 1. Hemoglobin improved to 7.5 and platelets 95.   The patient is here for cycle 3 day 8. Hemoglobin today 7.2 and platelets 156.   2.  Anemia.  Hemoglobin is 7.2 today.  Proceed with treatment today. He will return on Thursday for 1 unit packed red blood cells. Patient will return to clinic in 1 week for repeat labs. Per note on 12/10/2017 by Elzie Rings, NP, [facility] recommended dose reduction of Velcade to once a week on days 1 and 7 every 21 day cycle. He will not receive Velcade next week. Patient is to continue dose reduction of Revlimid to 10 mg as prescribed.  3.  Thrombocytopenia.  His platelet count is 156,000 today. Proceed with treatment today.   4.  Renal insufficiency: This is likely secondary to myeloma. His creatinine is 2.9 today.  5.  Rectal bleeding: Patient states this is intermittent. He is to be seen and evaluated in GI tomorrow. Will follow her recommendations. Per GI note, it is recommended to proceed with a colonoscopy and upper endoscopy given his anemia. This is to be scheduled ASAP.  Interval history:  67 y.o. male with diagnosis of active multiple myeloma with IgA kappa.  He is being treated with Revlimid, bortezomib, and low-dose dexamethasone under the direction of Dr. Lebron Conners.  Cycle 1 and Cycle 2 of therapy were complicated by grade 3 anemia necessitating holding of Keytruda.   Current status:  Patient presents today for  follow-up. At 100% and energy levels 75%. He does not complain of pain today. Patient offers chronic complaints of easy bruising/bleeding, occasional melena and swelling in feet and legs that is relieved with elevation and rest. He admits to occasional tingling in fingertips that remains unchanged. Patient states he has noticed a difference since his chemotherapy pills was reduced per [facility]. He feels much better on 10 mg of Revlimid. Patient denies any chest pain, constipation, diarrhea, urinary complaints or weakness.  Patient tells me he was recently seen and evaluated at [facility] for bone marrow transplant. He is still unsure about having this done. Patient states he is agreeable to stem cell harvesting which will be completed in the next several weeks.   Patient is to be seen by GI tomorrow for chronic occasional rectal bleeding.   DIAGNOSIS: Cancer Staging No matching staging information was found for the patient.  SUMMARY OF ONCOLOGIC HISTORY: Reviewed   CURRENT THERAPY: Revlimid and Velcade and Decadron.  Patient Active Problem List   Diagnosis Date Noted  . Hematochezia 12/18/2017  . Multiple myeloma ([place]) 08/29/2017  . CKD (chronic kidney disease) 08/29/2017  . Symptomatic anemia   . 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 AKI (acute kidney injury) ([place]) 07/24/2017  . Closed fracture of transverse process of lumbar vertebra, with delayed healing, subsequent encounter 07/24/2017  . Renal failure 07/23/2017  . Hypercalcemia 07/23/2017  . Anemia 07/23/2017  . Thrombocytopenia ([place]) 07/23/2017  .  Diabetes mellitus type 2 in obese ([place]) 07/23/2017   NKDA  MEDICAL HISTORY: Past Medical History:  Diagnosis Date  . Cancer ([place])    multiple myeloma  . Cancer of right kidney ([place])   . Cervical dystonia   . Diabetes mellitus without complication ([place])     SURGICAL HISTORY: Past Surgical History:  Procedure Laterality Date  . CHOLECYSTECTOMY  2007  . NEPHRECTOMY Right 1998   cancer     SOCIAL HISTORY: Social History   Socioeconomic History  . Marital status: Single    Spouse name: Not on file  . Number of children: Not on file  . Years of education: Not on file  . Highest education level: Not on file  Social Needs  . Financial resource strain: Not on file  . Food insecurity - worry: Not on file  . Food insecurity - inability: Not on file  . Transportation needs - medical: Not on file  . Transportation needs - non-medical: Not on file  Occupational History  . Occupation: Marine scientist, Insurance underwriter  Tobacco Use  . Smoking status: Never Smoker  . Smokeless tobacco: Current User    Types: Chew  Substance and Sexual Activity  . Alcohol use: No  . Drug use: No  . Sexual activity: Not on file  Other Topics Concern  . Not on file  Social History Narrative  . Not on file    FAMILY HISTORY: Family History  Problem Relation Age of Onset  . Heart failure Mother 61  . Dementia Father   . Colon cancer Neg Hx     Prior to Admission medications   Medication Sig Start Date End Date Taking? Authorizing Provider  acyclovir (ZOVIRAX) 400 MG tablet Take 1 tablet (400 mg total) 2 (two) times daily by mouth. 09/05/17  Yes Twana First, MD  allopurinol (ZYLOPRIM) 300 MG tablet Take 300 mg by mouth every morning.   Yes [provider]  aspirin EC 81 MG tablet Take 81 mg daily by mouth.   Yes [provider]  cyclobenzaprine (FLEXERIL) 10 MG tablet Take 10 mg by mouth at bedtime.   Yes [provider]  dexamethasone (DECADRON) 4 MG tablet Take 10 tablets (40 mg) on days 1 and 8 of chemo (velcade). Repeat every 21 days. 12/10/17  Yes Higgs, Mathis Dad, MD  doxycycline (VIBRA-TABS) 100 MG tablet Take 1 tablet by mouth 2 (two) times daily. 09/27/17  Yes [provider]  furosemide (LASIX) 40 MG tablet Take 40 mg by mouth. On Mon, wed, friday 10/28/17  Yes [provider]  glipiZIDE (GLIPIZIDE XL) 2.5 MG 24 hr tablet Take 1 tablet (2.5  mg total) by mouth daily with breakfast. 07/31/17  Yes Kathie Dike, MD  lenalidomide (REVLIMID) 10 MG capsule Take 1 capsule (10 mg) days 1-14. Then 7 days off.  Every 21 days. 12/10/17  Yes Higgs, Mathis Dad, MD  Multiple Vitamins-Minerals (CENTRUM SILVER 50+MEN) TABS Take 1 tablet by mouth every morning.   Yes [provider]  pravastatin (PRAVACHOL) 20 MG tablet Take 20 mg by mouth every morning.   Yes [provider]  sodium bicarbonate 650 MG tablet Take 1 tablet (650 mg total) by mouth 2 (two) times daily. Patient taking differently: Take 650 mg by mouth 3 (three) times daily.  07/31/17  Yes Kathie Dike, MD  traMADol (ULTRAM) 50 MG tablet Take 1 tablet (50 mg total) every 6 (six) hours as needed by mouth. 09/12/17  Yes Holley Bouche, NP  Na Sulfate-K  Sulfate-Mg Sulf (SUPREP BOWEL PREP KIT) 17.5-3.13-1.6 GM/177ML SOLN Take 1 kit by mouth as directed. 12/18/17   Rourk, [name], MD  prochlorperazine (COMPAZINE) 10 MG tablet Take 1 tablet (10 mg total) by mouth every 6 (six) hours as needed (Nausea or vomiting). 08/29/17 09/05/17  Twana First, MD    Review of Systems  Constitutional: Positive for fatigue (chronic). Negative for appetite change and chills.  HENT:  Negative.   Eyes: Negative.   Respiratory: Positive for shortness of breath (with exertion). Negative for cough and wheezing.   Cardiovascular: Positive for leg swelling (occasional relieved with elevation). Negative for chest pain.  Gastrointestinal: Positive for blood in stool (hronic seeing GI tomorrow). Negative for abdominal pain, constipation, diarrhea and nausea.  Genitourinary: Negative.  Negative for bladder incontinence, dysuria and frequency.   Musculoskeletal: Negative.  Negative for back pain.  Skin: Negative.   Neurological: Negative.   Hematological: Bruises/bleeds easily.  Psychiatric/Behavioral: Negative.  Negative for confusion. The patient is not nervous/anxious.    Negative other than leg  swelling.  He reports neuropathy is improving.     PHYSICAL EXAMINATION  ECOG PERFORMANCE STATUS: 1  Vitals:   12/17/17 1438  BP: (!) 143/65  Pulse: 91  Resp: 18  Temp: 98.3 F (36.8 C)  SpO2: 99%    Physical Exam  Constitutional: He is oriented to person, place, and time and well-developed, well-nourished, and in no distress. Vital signs are normal.  HENT:  Head: Normocephalic and atraumatic.  Eyes: Pupils are equal, round, and reactive to light.  Neck: Normal range of motion.  Cardiovascular: Normal rate, regular rhythm and normal heart sounds.  No murmur heard. Pulmonary/Chest: Effort normal and breath sounds normal. He has no wheezes.  Abdominal: Soft. Normal appearance and bowel sounds are normal. He exhibits no distension. There is no tenderness.  Musculoskeletal: Normal range of motion. He exhibits no edema.  Neurological: He is alert and oriented to person, place, and time. Gait normal.  Skin: Skin is warm and dry. No rash noted.  Psychiatric: Mood, memory, affect and judgment normal.  Ext:  Trace LE edema  LABORATORY DATA:  CBC    Component Value Date/Time   WBC 3.4 (L) 12/17/2017 1343   RBC 2.18 (L) 12/17/2017 1343   HGB 7.2 (L) 12/17/2017 1343   HCT 21.5 (L) 12/17/2017 1343   PLT 156 12/17/2017 1343   MCV 98.6 12/17/2017 1343   MCH 33.0 12/17/2017 1343   MCHC 33.5 12/17/2017 1343   RDW 19.9 (H) 12/17/2017 1343   LYMPHSABS 0.5 (L) 12/17/2017 1343   MONOABS 0.4 12/17/2017 1343   EOSABS 0.0 12/17/2017 1343   BASOSABS 0.0 12/17/2017 1343    CMP     Component Value Date/Time   NA 137 12/17/2017 1343   K 4.2 12/17/2017 1343   CL 105 12/17/2017 1343   CO2 20 (L) 12/17/2017 1343   GLUCOSE 209 (H) 12/17/2017 1343   BUN 51 (H) 12/17/2017 1343   CREATININE 2.94 (H) 12/17/2017 1343   CALCIUM 8.8 (L) 12/17/2017 1343   CALCIUM 10.7 (H) 07/25/2017 0447   PROT 6.3 (L) 12/17/2017 1343   ALBUMIN 4.0 12/17/2017 1343   AST 17 12/17/2017 1343   ALT 17  12/17/2017 1343   ALKPHOS 156 (H) 12/17/2017 1343   BILITOT 0.6 12/17/2017 1343   GFRNONAA 21 (L) 12/17/2017 1343   GFRAA 24 (L) 12/17/2017 1343    ASSESSMENT and THERAPY PLAN: See above  Orders Placed This Encounter  Procedures  . Type  and screen    Standing Status:   Future    Number of Occurrences:   1    Standing Expiration Date:   12/17/2018  . Prepare RBC    Standing Status:   Standing    Number of Occurrences:   1    Order Specific Question:   # of Units    Answer:   1 unit    Order Specific Question:   Transfusion Indications    Answer:   Symptomatic Anemia    Order Specific Question:   If emergent release call blood bank    Answer:   [name] [phone]    All questions were answered. The patient knows to call the clinic with any problems, questions or concerns. We can certainly see the patient much sooner if necessary.  Greater than 50% was spent in counseling and coordination of care with this patient including but not limited to discussion of the relevant topics above (See A&P) including, but not limited to diagnosis and management of acute and chronic medical conditions.   This note was electronically signed. Jacquelin Hawking, NP 12/19/2017

## 2017-12-17 NOTE — Patient Instructions (Addendum)
[place] at [facility] Discharge Instructions  RECOMMENDATIONS MADE BY THE CONSULTANT AND ANY TEST RESULTS WILL BE SENT TO YOUR REFERRING PHYSICIAN.  You saw Rulon Abide, NP, today. Please see Amy or Danielle for follow up appointments.  Thank you for choosing [place] at [facility] to provide your oncology and hematology care.  To afford each patient quality time with our provider, please arrive at least 15 minutes before your scheduled appointment time.    If you have a lab appointment with the [facility] please come in thru the  Main Entrance and check in at the main information desk  You need to re-schedule your appointment should you arrive 10 or more minutes late.  We strive to give you quality time with our providers, and arriving late affects you and other patients whose appointments are after yours.  Also, if you no show three or more times for appointments you may be dismissed from the clinic at the providers discretion.     Again, thank you for choosing [facility].  Our hope is that these requests will decrease the amount of time that you wait before being seen by our physicians.       _____________________________________________________________  Should you have questions after your visit to [facility], please contact our office at [phone] between the hours of 8:30 a.m. and 4:30 p.m.  Voicemails left after 4:30 p.m. will not be returned until the following business day.  For prescription refill requests, have your pharmacy contact our office.       Resources For Cancer Patients and their Caregivers ? American Cancer Society: Can assist with transportation, wigs, general needs, runs Look Good Feel Better.        601-861-7675 ? Cancer Care: Provides financial assistance, online support groups, medication/co-pay assistance.  1-800-813-HOPE 916-700-8248) ? [facility] Assists [place] cancer patients and their families through emotional , educational and financial support.  2190813489 ? [place] DSS Where to apply for food stamps, Medicaid and utility assistance. (240)550-0502 ? [facility]: Transportation to medical appointments. 667-078-1557 ? Social Security Administration: May apply for disability if have a Stage IV cancer. (814) 864-1188 (702)746-2735 ? [employer], Disability and Transit Services: Assists with nutrition, care and transit needs. [facility] Support Programs: @10RELATIVEDAYS @ > Cancer Support Group  2nd Tuesday of the month 1pm-2pm, Journey Room  > Creative Journey  3rd Tuesday of the month 1130am-1pm, Journey Room  > Look Good Feel Better  1st Wednesday of the month 10am-12 noon, Journey Room (Call [facility] [facility] to register [phone])   [facility] Discharge Instructions for Patients Receiving Chemotherapy  Today you received the following chemotherapy agents velcade shot today.    If you develop nausea and vomiting that is not controlled by your nausea medication, call the clinic.   BELOW ARE SYMPTOMS THAT SHOULD BE REPORTED IMMEDIATELY:  *FEVER GREATER THAN 100.5 F  *CHILLS WITH OR WITHOUT FEVER  NAUSEA AND VOMITING THAT IS NOT CONTROLLED WITH YOUR NAUSEA MEDICATION  *UNUSUAL SHORTNESS OF BREATH  *UNUSUAL BRUISING OR BLEEDING  TENDERNESS IN MOUTH AND THROAT WITH OR WITHOUT PRESENCE OF ULCERS  *URINARY PROBLEMS  *BOWEL PROBLEMS  UNUSUAL RASH Items with * indicate a potential emergency and should be followed up as soon as possible.  Feel free to call the clinic should you have any questions or concerns. The clinic phone number is [phone]  [phone].  Please show the [facility] at check-in to the Emergency Department and triage nurse.

## 2017-12-17 NOTE — Patient Instructions (Signed)
[facility] Discharge Instructions for Patients Receiving Chemotherapy  Today you received the following chemotherapy agents velcade shot today.  Call for any questions or concerns.     If you develop nausea and vomiting that is not controlled by your nausea medication, call the clinic.   BELOW ARE SYMPTOMS THAT SHOULD BE REPORTED IMMEDIATELY:  *FEVER GREATER THAN 100.5 F  *CHILLS WITH OR WITHOUT FEVER  NAUSEA AND VOMITING THAT IS NOT CONTROLLED WITH YOUR NAUSEA MEDICATION  *UNUSUAL SHORTNESS OF BREATH  *UNUSUAL BRUISING OR BLEEDING  TENDERNESS IN MOUTH AND THROAT WITH OR WITHOUT PRESENCE OF ULCERS  *URINARY PROBLEMS  *BOWEL PROBLEMS  UNUSUAL RASH Items with * indicate a potential emergency and should be followed up as soon as possible.  Feel free to call the clinic should you have any questions or concerns. The clinic phone number is [phone].  Please show the [facility] at check-in to the Emergency Department and triage nurse.

## 2017-12-17 NOTE — Progress Notes (Signed)
Labs reviewed with Faythe Casa, NP, and ok to treat today. Patient denied any symptoms related to anemia or thrombocytopenia.    Patient to be scheduled for a blood transfusion for Thursday.  Patient tolerated velcade shot with no complaints voiced.  Injection site clean and dry with no bruising or swelling noted at site.  Band aid applied.  VSS with discharge and left ambulatory with no s/s of distress noted.

## 2017-12-18 ENCOUNTER — Telehealth: Payer: Self-pay

## 2017-12-18 ENCOUNTER — Encounter: Payer: Self-pay | Admitting: Nurse Practitioner

## 2017-12-18 ENCOUNTER — Ambulatory Visit (INDEPENDENT_AMBULATORY_CARE_PROVIDER_SITE_OTHER): Payer: Medicare Other | Admitting: Nurse Practitioner

## 2017-12-18 ENCOUNTER — Other Ambulatory Visit: Payer: Self-pay

## 2017-12-18 VITALS — BP 148/73 | HR 96 | Temp 97.8°F | Ht 69.0 in | Wt 212.8 lb

## 2017-12-18 DIAGNOSIS — D649 Anemia, unspecified: Secondary | ICD-10-CM | POA: Diagnosis not present

## 2017-12-18 DIAGNOSIS — K274 Chronic or unspecified peptic ulcer, site unspecified, with hemorrhage: Secondary | ICD-10-CM

## 2017-12-18 DIAGNOSIS — K921 Melena: Secondary | ICD-10-CM | POA: Insufficient documentation

## 2017-12-18 MED ORDER — NA SULFATE-K SULFATE-MG SULF 17.5-3.13-1.6 GM/177ML PO SOLN: 1.0000 | ORAL | 0 refills | Status: DC

## 2017-12-18 NOTE — Progress Notes (Signed)
cc'ed to pcp °

## 2017-12-18 NOTE — Telephone Encounter (Signed)
Called and informed pt of pre-op appt 01/10/18 at 12:45pm. Letter mailed.

## 2017-12-18 NOTE — Patient Instructions (Signed)
1. We will schedule your procedures for you. 2. Further recommendations will be made after your procedures. 3. Return for follow-up in 3 months. 4. Call us if you have any questions or concerns.

## 2017-12-18 NOTE — Assessment & Plan Note (Signed)
The patient has noted some intermittent hematochezia typically on the toilet tissue and occasionally in the stool.  This is been ongoing for the past couple months.  He has been significantly anemic.  However, he is on treatment for multiple myeloma.  His anemia is likely multifactorial.  His last colonoscopy was 12/07/2010 which was essentially normal and recommended 10-year repeat.  We have been asked to repeat endoscopic evaluation for his anemia and hematochezia.  At this point we will proceed with a colonoscopy and possible upper endoscopy given the degree of anemia he has.  Proceed with TCS +/- EGD on propofol/MAC with Dr. Gala Romney in near future: the risks, benefits, and alternatives have been discussed with the patient in detail. The patient states understanding and desires to proceed.  She is currently on Flexeril and tramadol every 6 hours.  No other anticoagulants, anxiolytics, chronic pain medications, or antidepressants.  We will plan for the procedure on propofol/MAC to promote adequate sedation.

## 2017-12-18 NOTE — Progress Notes (Signed)
Primary Care Physician:  Asencion Noble, MD Primary Gastroenterologist:  Dr. Gala Romney  Chief Complaint  Patient presents with  . Blood In Stools    bright red x 1 month.    HPI:   Tom Marshall is a 67 y.o. male who presents for evaluation of rectal bleeding and possible GI bleed.  He was referred by the oncology center for this.  Previous colonoscopy completed 12/07/2010 which found anal papilla, otherwise normal rectum.  Long, somewhat redundant colon but otherwise normal in appearance.  Recommended repeat colonoscopy in 10 years.  Last available oncology note 11/26/2017.  The patient is currently on treatment for multiple myeloma not in remission.  He had significant anemia with a hemoglobin of 7.6 as well as thrombocytopenia.  This necessitated holding of 1 of his medications.  He did admit occasional blood in his stool.  At a previous office visit on 10/23/2017 his hemoglobin was 5.5.  He noted decrease in energy at that time as well as fatigue and shortness of breath with exertion.  He noted occasional bright red spotting on his stools in 2-3 occasions with visible blood on the stool.  Denied hemorrhoids at that time.  Recommended GI follow-up/evaluation for rectal bleeding.  His most recent CBC was yesterday, 12/17/2017 which found a hemoglobin of 7.2 which is normocytic and normochromic.  On his chemotherapy medications he is generally had pancytopenia.  Today states he's doing ok. Is on therapy for multiple myeloma. Saw [facility] a couple weeks ago to discuss bone marrow transplant but awaiting colonoscopy before doing cell harvest and subsequent transplant. History of kidney cancer with 1 kidney remaining. He started noticing rectal bleeding for the past month. Feels a "little bean at the edge/opening" of the rectum, tried to reduce and hasn't had success; feels this may be a hemorrhoid and the cause of bleeding. Hematochezia is intermittent about once a week; on the paper and on the stool. Denies  abdominal pain, N/V, melena, unintentional weight loss, fever, chills, acute changes in bowel movements. Energy is back up to 85%, fatigue improving. Appetite is "too good." Denies chest pain, dyspnea, dizziness, lightheadedness, syncope, near syncope. Denies any other upper or lower GI symptoms.  He is getting a unit of blood tomorrow. He states oncology thinks his anemia is primarily from bleeding rather than chemotherapy medication.  Past Medical History:  Diagnosis Date  . Cancer ([place])    multiple myeloma  . Cancer of right kidney ([place])   . Cervical dystonia   . Diabetes mellitus without complication [facility])     Past Surgical History:  Procedure Laterality Date  . CHOLECYSTECTOMY  2007  . NEPHRECTOMY Right 1998   cancer    Current Outpatient Medications  Medication Sig Dispense Refill  . acyclovir (ZOVIRAX) 400 MG tablet Take 1 tablet (400 mg total) 2 (two) times daily by mouth. 60 tablet 3  . allopurinol (ZYLOPRIM) 300 MG tablet Take 300 mg by mouth every morning.    [name] aspirin EC 81 MG tablet Take 81 mg daily by mouth.    . cyclobenzaprine (FLEXERIL) 10 MG tablet Take 10 mg by mouth at bedtime.    [name] dexamethasone (DECADRON) 4 MG tablet Take 10 tablets (40 mg) on days 1 and 8 of chemo (velcade). Repeat every 21 days. 30 tablet 3  . doxycycline (VIBRA-TABS) 100 MG tablet Take 1 tablet by mouth 2 (two) times daily.    . furosemide (LASIX) 40 MG tablet Take 40 mg by mouth. On Mon,  wed, friday    . glipiZIDE (GLIPIZIDE XL) 2.5 MG 24 hr tablet Take 1 tablet (2.5 mg total) by mouth daily with breakfast. 30 tablet 0  . lenalidomide (REVLIMID) 10 MG capsule Take 1 capsule (10 mg) days 1-14. Then 7 days off.  Every 21 days. 14 capsule 0  . Multiple Vitamins-Minerals (CENTRUM SILVER 50+MEN) TABS Take 1 tablet by mouth every morning.    . pravastatin (PRAVACHOL) 20 MG tablet Take 20 mg by mouth every morning.    . sodium bicarbonate 650 MG tablet Take 1 tablet (650 mg total) by mouth 2 (two)  times daily. (Patient taking differently: Take 650 mg by mouth 3 (three) times daily. ) 60 tablet 1  . traMADol (ULTRAM) 50 MG tablet Take 1 tablet (50 mg total) every 6 (six) hours as needed by mouth. 30 tablet 0   No current facility-administered medications for this visit.    Facility-Administered Medications Ordered in Other Visits  Medication Dose Route Frequency Provider Last Rate Last Dose  . 0.9 %  sodium chloride infusion  250 mL Intravenous Once Twana First, MD        Allergies as of 12/18/2017  . (No Known Allergies)    Family History  Problem Relation Age of Onset  . Heart failure Mother 65  . Dementia Father   . Colon cancer Neg Hx     Social History   Socioeconomic History  . Marital status: Single    Spouse name: Not on file  . Number of children: Not on file  . Years of education: Not on file  . Highest education level: Not on file  Social Needs  . Financial resource strain: Not on file  . Food insecurity - worry: Not on file  . Food insecurity - inability: Not on file  . Transportation needs - medical: Not on file  . Transportation needs - non-medical: Not on file  Occupational History  . Occupation: Marine scientist, Insurance underwriter  Tobacco Use  . Smoking status: Never Smoker  . Smokeless tobacco: Current User    Types: Chew  Substance and Sexual Activity  . Alcohol use: No  . Drug use: No  . Sexual activity: Not on file  Other Topics Concern  . Not on file  Social History Narrative  . Not on file    Review of Systems: General: Negative for anorexia, weight loss, fever, chills, fatigue, weakness. ENT: Negative for hoarseness, difficulty swallowing , nasal congestion. CV: Negative for chest pain, angina, palpitations, peripheral edema.  Respiratory: Negative for dyspnea at rest, cough, sputum, wheezing.  GI: See history of present illness. MS: Negative for joint pain, low back pain.  Derm: Negative for rash or itching.  Endo: Negative for  unusual weight change.  Heme: Negative for bruising or bleeding. Allergy: Negative for rash or hives.    Physical Exam: BP (!) 148/73   Pulse 96   Temp 97.8 F (36.6 C) (Oral)   Ht '5\' 9"'  (1.753 m)   Wt 212 lb 12.8 oz (96.5 kg)   BMI 31.43 kg/m  General:   Alert and oriented. Pleasant and cooperative. Well-nourished and well-developed.  Head:  Normocephalic and atraumatic. Eyes:  Without icterus, sclera clear and conjunctiva pink.  Ears:  Normal auditory acuity. Cardiovascular:  S1, S2 present without murmurs appreciated. Extremities without clubbing or edema. Respiratory:  Clear to auscultation bilaterally. No wheezes, rales, or rhonchi. No distress.  Gastrointestinal:  +BS, soft, non-tender and non-distended. No HSM noted. No guarding or rebound.  No masses appreciated.  Rectal:  Deferred  Musculoskalatal:  Symmetrical without gross deformities. Normal posture. Neurologic:  Alert and oriented x4;  grossly normal neurologically. Psych:  Alert and cooperative. Normal mood and affect. Heme/Lymph/Immune: No excessive bruising noted.    12/18/2017 10:10 AM   Disclaimer: This note was dictated with voice recognition software. Similar sounding words can inadvertently be transcribed and may not be corrected upon review.

## 2017-12-18 NOTE — Assessment & Plan Note (Signed)
Patient with significant anemia with a hemoglobin as low as 5.5 currently in the setting of treatment for multiple myeloma as well as noted hematochezia.  He has had to at least temporarily discontinue 1 of his medications due to the degree of anemia.  Overall, he does have pancytopenia as well.  We have been asked by oncology to further evaluate with possible endoscopic evaluation in order to determine if there is a significant contribution from GI bleed.  Last colonoscopy as per HPI.  Hematochezia as per above.  His hemoglobin yesterday was 7.2.  Given the degree of his anemia we will plan for colonoscopy as well as possible upper endoscopy at the endoscopist discretion.  Continue to follow with hematology/oncology for multiple myeloma and management of anemia.  Follow-up in 3 months.

## 2017-12-19 ENCOUNTER — Encounter (HOSPITAL_COMMUNITY): Payer: Self-pay

## 2017-12-19 ENCOUNTER — Inpatient Hospital Stay (HOSPITAL_COMMUNITY): Payer: Medicare Other

## 2017-12-19 ENCOUNTER — Other Ambulatory Visit: Payer: Self-pay

## 2017-12-19 DIAGNOSIS — K625 Hemorrhage of anus and rectum: Secondary | ICD-10-CM | POA: Diagnosis not present

## 2017-12-19 DIAGNOSIS — C9000 Multiple myeloma not having achieved remission: Secondary | ICD-10-CM | POA: Diagnosis not present

## 2017-12-19 DIAGNOSIS — Z5111 Encounter for antineoplastic chemotherapy: Secondary | ICD-10-CM | POA: Diagnosis not present

## 2017-12-19 DIAGNOSIS — D649 Anemia, unspecified: Secondary | ICD-10-CM | POA: Diagnosis not present

## 2017-12-19 DIAGNOSIS — E875 Hyperkalemia: Secondary | ICD-10-CM | POA: Diagnosis not present

## 2017-12-19 DIAGNOSIS — D696 Thrombocytopenia, unspecified: Secondary | ICD-10-CM | POA: Diagnosis not present

## 2017-12-19 MED ORDER — DIPHENHYDRAMINE HCL 25 MG PO CAPS
25.0000 mg | ORAL_CAPSULE | Freq: Once | ORAL | Status: AC
  Administered 2017-12-19: 25 mg via ORAL

## 2017-12-19 MED ORDER — ACETAMINOPHEN 325 MG PO TABS
650.0000 mg | ORAL_TABLET | Freq: Once | ORAL | Status: AC
  Administered 2017-12-19: 650 mg via ORAL

## 2017-12-19 MED ORDER — ACETAMINOPHEN 325 MG PO TABS
ORAL_TABLET | ORAL | Status: AC
  Filled 2017-12-19: qty 2

## 2017-12-19 MED ORDER — DIPHENHYDRAMINE HCL 25 MG PO CAPS
ORAL_CAPSULE | ORAL | Status: AC
  Filled 2017-12-19: qty 1

## 2017-12-19 MED ORDER — SODIUM CHLORIDE 0.9 % IV SOLN
250.0000 mL | Freq: Once | INTRAVENOUS | Status: AC
  Administered 2017-12-19: 250 mL via INTRAVENOUS

## 2017-12-19 NOTE — Progress Notes (Signed)
Treatment given per orders. Patient tolerated it well without problems. Vitals stable and discharged home from clinic ambulatory. Follow up as scheduled.  One unit of blood given today per orders.

## 2017-12-19 NOTE — Patient Instructions (Signed)
[place] at [facility] Discharge Instructions  RECOMMENDATIONS MADE BY THE CONSULTANT AND ANY TEST RESULTS WILL BE SENT TO YOUR REFERRING PHYSICIAN.  One unit of blood given Follow up as scheduled.  Thank you for choosing [place] at [facility] to provide your oncology and hematology care.  To afford each patient quality time with our provider, please arrive at least 15 minutes before your scheduled appointment time.    If you have a lab appointment with the [place] please come in thru the  Main Entrance and check in at the main information desk  You need to re-schedule your appointment should you arrive 10 or more minutes late.  We strive to give you quality time with our providers, and arriving late affects you and other patients whose appointments are after yours.  Also, if you no show three or more times for appointments you may be dismissed from the clinic at the providers discretion.     Again, thank you for choosing [facility].  Our hope is that these requests will decrease the amount of time that you wait before being seen by our physicians.       _____________________________________________________________  Should you have questions after your visit to [facility], please contact our office at [phone] between the hours of 8:30 a.m. and 4:30 p.m.  Voicemails left after 4:30 p.m. will not be returned until the following business day.  For prescription refill requests, have your pharmacy contact our office.       Resources For Cancer Patients and their Caregivers ? American Cancer Society: Can assist with transportation, wigs, general needs, runs Look Good Feel Better.        857-523-7671 ? Cancer Care: Provides financial assistance, online support groups, medication/co-pay assistance.  1-800-813-HOPE (289)590-6800) ? [place] Assists [place] cancer patients and  their families through emotional , educational and financial support.  (386) 103-1964 ? [place] DSS Where to apply for food stamps, Medicaid and utility assistance. 506-598-3679 ? RCATS: Transportation to medical appointments. 346-473-1918 ? Social Security Administration: May apply for disability if have a Stage IV cancer. 5161888332 407-653-5681 ? [employer], Disability and Transit Services: Assists with nutrition, care and transit needs. [place] Support Programs: @10RELATIVEDAYS @ > Cancer Support Group  2nd Tuesday of the month 1pm-2pm, Journey Room  > Creative Journey  3rd Tuesday of the month 1130am-1pm, Journey Room  > Look Good Feel Better  1st Wednesday of the month 10am-12 noon, Journey Room (Call [place] to register [phone])

## 2017-12-22 LAB — BPAM RBC
Blood Product Expiration Date: 201902282359
ISSUE DATE / TIME: 201902211031
Unit Type and Rh: 6200

## 2017-12-22 LAB — TYPE AND SCREEN
ABO/RH(D): A POS
ANTIBODY SCREEN: NEGATIVE
Unit division: 0

## 2017-12-24 ENCOUNTER — Inpatient Hospital Stay (HOSPITAL_COMMUNITY): Payer: Medicare Other

## 2017-12-24 ENCOUNTER — Inpatient Hospital Stay (HOSPITAL_BASED_OUTPATIENT_CLINIC_OR_DEPARTMENT_OTHER): Payer: Medicare Other | Admitting: Internal Medicine

## 2017-12-24 ENCOUNTER — Encounter (HOSPITAL_COMMUNITY): Payer: Self-pay | Admitting: Internal Medicine

## 2017-12-24 VITALS — BP 138/56 | HR 85 | Temp 98.0°F | Resp 18 | Wt 212.4 lb

## 2017-12-24 DIAGNOSIS — K625 Hemorrhage of anus and rectum: Secondary | ICD-10-CM | POA: Diagnosis not present

## 2017-12-24 DIAGNOSIS — E875 Hyperkalemia: Secondary | ICD-10-CM

## 2017-12-24 DIAGNOSIS — C9000 Multiple myeloma not having achieved remission: Secondary | ICD-10-CM

## 2017-12-24 DIAGNOSIS — D649 Anemia, unspecified: Secondary | ICD-10-CM

## 2017-12-24 DIAGNOSIS — Z79899 Other long term (current) drug therapy: Secondary | ICD-10-CM

## 2017-12-24 DIAGNOSIS — D696 Thrombocytopenia, unspecified: Secondary | ICD-10-CM

## 2017-12-24 DIAGNOSIS — Z5111 Encounter for antineoplastic chemotherapy: Secondary | ICD-10-CM | POA: Diagnosis not present

## 2017-12-24 LAB — SAMPLE TO BLOOD BANK

## 2017-12-24 LAB — CBC WITH DIFFERENTIAL/PLATELET
BASOS ABS: 0 10*3/uL (ref 0.0–0.1)
BASOS PCT: 0 %
Eosinophils Absolute: 0 10*3/uL (ref 0.0–0.7)
Eosinophils Relative: 0 %
HEMATOCRIT: 25.7 % — AB (ref 39.0–52.0)
HEMOGLOBIN: 8.5 g/dL — AB (ref 13.0–17.0)
Lymphocytes Relative: 8 %
Lymphs Abs: 0.6 10*3/uL — ABNORMAL LOW (ref 0.7–4.0)
MCH: 33.1 pg (ref 26.0–34.0)
MCHC: 33.1 g/dL (ref 30.0–36.0)
MCV: 100 fL (ref 78.0–100.0)
Monocytes Absolute: 0.2 10*3/uL (ref 0.1–1.0)
Monocytes Relative: 2 %
NEUTROS ABS: 6.6 10*3/uL (ref 1.7–7.7)
NEUTROS PCT: 90 %
Platelets: 182 10*3/uL (ref 150–400)
RBC: 2.57 MIL/uL — AB (ref 4.22–5.81)
RDW: 19.2 % — AB (ref 11.5–15.5)
WBC: 7.4 10*3/uL (ref 4.0–10.5)

## 2017-12-24 MED ORDER — LENALIDOMIDE 10 MG PO CAPS: ORAL_CAPSULE | ORAL | 0 refills | Status: DC

## 2017-12-24 NOTE — Patient Instructions (Addendum)
[place] at [facility] Discharge Instructions  RECOMMENDATIONS MADE BY THE CONSULTANT AND ANY TEST RESULTS WILL BE SENT TO YOUR REFERRING PHYSICIAN.  You were seen by Dr. Zoila Shutter You will only take Decadron on days 1 and day 8 when you get the velcade injection.  You will continue taking the Revlimid as you were before days 1-14. See your new calendar We will see you back in April  Thank you for choosing [place] at [facility] to provide your oncology and hematology care.  To afford each patient quality time with our provider, please arrive at least 15 minutes before your scheduled appointment time.    If you have a lab appointment with the [place] please come in thru the  Main Entrance and check in at the main information desk  You need to re-schedule your appointment should you arrive 10 or more minutes late.  We strive to give you quality time with our providers, and arriving late affects you and other patients whose appointments are after yours.  Also, if you no show three or more times for appointments you may be dismissed from the clinic at the providers discretion.     Again, thank you for choosing [facility].  Our hope is that these requests will decrease the amount of time that you wait before being seen by our physicians.       _____________________________________________________________  Should you have questions after your visit to [facility], please contact our office at [phone] between the hours of 8:30 a.m. and 4:30 p.m.  Voicemails left after 4:30 p.m. will not be returned until the following business day.  For prescription refill requests, have your pharmacy contact our office.       Resources For Cancer Patients and their Caregivers ? American Cancer Society: Can assist with transportation, wigs, general needs, runs Look Good Feel Better.        541-479-3114 ? Cancer  Care: Provides financial assistance, online support groups, medication/co-pay assistance.  1-800-813-HOPE 250-585-1067) ? [place] Assists [place] cancer patients and their families through emotional , educational and financial support.  (860) 318-1203 ? [place] DSS Where to apply for food stamps, Medicaid and utility assistance. 480-243-0799 ? RCATS: Transportation to medical appointments. (754)551-1174 ? Social Security Administration: May apply for disability if have a Stage IV cancer. 818-514-2956 (952) 267-8136 ? [employer], Disability and Transit Services: Assists with nutrition, care and transit needs. [place] Support Programs: @10RELATIVEDAYS @ > Cancer Support Group  2nd Tuesday of the month 1pm-2pm, Journey Room  > Creative Journey  3rd Tuesday of the month 1130am-1pm, Journey Room  > Look Good Feel Better  1st Wednesday of the month 10am-12 noon, Journey Room (Call [place] to register [phone])

## 2017-12-31 ENCOUNTER — Encounter (HOSPITAL_COMMUNITY): Payer: Self-pay

## 2017-12-31 ENCOUNTER — Inpatient Hospital Stay (HOSPITAL_COMMUNITY): Payer: Medicare Other | Attending: Internal Medicine

## 2017-12-31 ENCOUNTER — Other Ambulatory Visit (HOSPITAL_COMMUNITY)
Admission: RE | Admit: 2017-12-31 | Discharge: 2017-12-31 | Disposition: A | Discharge status: Home / Self Care | Payer: Medicare Other | Source: Ambulatory Visit | Attending: Nephrology | Admitting: Nephrology

## 2017-12-31 ENCOUNTER — Inpatient Hospital Stay (HOSPITAL_COMMUNITY): Payer: Medicare Other

## 2017-12-31 VITALS — BP 148/64 | HR 85 | Temp 97.6°F | Resp 18 | Wt 207.2 lb

## 2017-12-31 DIAGNOSIS — R69 Illness, unspecified: Secondary | ICD-10-CM | POA: Insufficient documentation

## 2017-12-31 DIAGNOSIS — Z5111 Encounter for antineoplastic chemotherapy: Secondary | ICD-10-CM | POA: Insufficient documentation

## 2017-12-31 DIAGNOSIS — D649 Anemia, unspecified: Secondary | ICD-10-CM | POA: Insufficient documentation

## 2017-12-31 DIAGNOSIS — C9000 Multiple myeloma not having achieved remission: Secondary | ICD-10-CM | POA: Diagnosis not present

## 2017-12-31 DIAGNOSIS — Z79899 Other long term (current) drug therapy: Secondary | ICD-10-CM | POA: Diagnosis not present

## 2017-12-31 DIAGNOSIS — D696 Thrombocytopenia, unspecified: Secondary | ICD-10-CM | POA: Diagnosis not present

## 2017-12-31 DIAGNOSIS — N189 Chronic kidney disease, unspecified: Secondary | ICD-10-CM | POA: Insufficient documentation

## 2017-12-31 DIAGNOSIS — Z85528 Personal history of other malignant neoplasm of kidney: Secondary | ICD-10-CM | POA: Insufficient documentation

## 2017-12-31 DIAGNOSIS — D472 Monoclonal gammopathy: Secondary | ICD-10-CM | POA: Diagnosis not present

## 2017-12-31 DIAGNOSIS — E1122 Type 2 diabetes mellitus with diabetic chronic kidney disease: Secondary | ICD-10-CM | POA: Diagnosis not present

## 2017-12-31 DIAGNOSIS — Z7982 Long term (current) use of aspirin: Secondary | ICD-10-CM | POA: Diagnosis not present

## 2017-12-31 DIAGNOSIS — Z7984 Long term (current) use of oral hypoglycemic drugs: Secondary | ICD-10-CM | POA: Diagnosis not present

## 2017-12-31 DIAGNOSIS — Z905 Acquired absence of kidney: Secondary | ICD-10-CM | POA: Diagnosis not present

## 2017-12-31 DIAGNOSIS — E669 Obesity, unspecified: Secondary | ICD-10-CM | POA: Insufficient documentation

## 2017-12-31 LAB — CBC WITH DIFFERENTIAL/PLATELET
Basophils Absolute: 0 10*3/uL (ref 0.0–0.1)
Basophils Relative: 1 %
EOS PCT: 0 %
Eosinophils Absolute: 0 10*3/uL (ref 0.0–0.7)
HEMATOCRIT: 26.8 % — AB (ref 39.0–52.0)
Hemoglobin: 8.9 g/dL — ABNORMAL LOW (ref 13.0–17.0)
LYMPHS ABS: 1.2 10*3/uL (ref 0.7–4.0)
Lymphocytes Relative: 24 %
MCH: 33.2 pg (ref 26.0–34.0)
MCHC: 33.2 g/dL (ref 30.0–36.0)
MCV: 100 fL (ref 78.0–100.0)
MONO ABS: 0.6 10*3/uL (ref 0.1–1.0)
MONOS PCT: 13 %
Neutro Abs: 3.1 10*3/uL (ref 1.7–7.7)
Neutrophils Relative %: 62 %
PLATELETS: 119 10*3/uL — AB (ref 150–400)
RBC: 2.68 MIL/uL — ABNORMAL LOW (ref 4.22–5.81)
RDW: 18.1 % — AB (ref 11.5–15.5)
WBC: 5 10*3/uL (ref 4.0–10.5)

## 2017-12-31 LAB — COMPREHENSIVE METABOLIC PANEL
ALT: 22 U/L (ref 17–63)
ANION GAP: 13 (ref 5–15)
AST: 16 U/L (ref 15–41)
Albumin: 4.1 g/dL (ref 3.5–5.0)
Alkaline Phosphatase: 154 U/L — ABNORMAL HIGH (ref 38–126)
BUN: 45 mg/dL — AB (ref 6–20)
CHLORIDE: 102 mmol/L (ref 101–111)
CO2: 23 mmol/L (ref 22–32)
Calcium: 9.1 mg/dL (ref 8.9–10.3)
Creatinine, Ser: 2.8 mg/dL — ABNORMAL HIGH (ref 0.61–1.24)
GFR, EST AFRICAN AMERICAN: 25 mL/min — AB (ref >60)
GFR, EST NON AFRICAN AMERICAN: 22 mL/min — AB (ref >60)
Glucose, Bld: 180 mg/dL — ABNORMAL HIGH (ref 65–99)
Potassium: 4.8 mmol/L (ref 3.5–5.1)
Sodium: 138 mmol/L (ref 135–145)
TOTAL PROTEIN: 6.5 g/dL (ref 6.5–8.1)
Total Bilirubin: 0.7 mg/dL (ref 0.3–1.2)

## 2017-12-31 LAB — RENAL FUNCTION PANEL
ANION GAP: 12 (ref 5–15)
Albumin: 4 g/dL (ref 3.5–5.0)
BUN: 45 mg/dL — ABNORMAL HIGH (ref 6–20)
CHLORIDE: 102 mmol/L (ref 101–111)
CO2: 24 mmol/L (ref 22–32)
Calcium: 9 mg/dL (ref 8.9–10.3)
Creatinine, Ser: 2.77 mg/dL — ABNORMAL HIGH (ref 0.61–1.24)
GFR calc non Af Amer: 22 mL/min — ABNORMAL LOW (ref >60)
GFR, EST AFRICAN AMERICAN: 26 mL/min — AB (ref >60)
Glucose, Bld: 181 mg/dL — ABNORMAL HIGH (ref 65–99)
Phosphorus: 4.3 mg/dL (ref 2.5–4.6)
Potassium: 4.7 mmol/L (ref 3.5–5.1)
Sodium: 138 mmol/L (ref 135–145)

## 2017-12-31 LAB — IRON AND TIBC
Iron: 145 ug/dL (ref 45–182)
SATURATION RATIOS: 51 % — AB (ref 17.9–39.5)
TIBC: 287 ug/dL (ref 250–450)
UIBC: 142 ug/dL

## 2017-12-31 LAB — PROTEIN / CREATININE RATIO, URINE
CREATININE, URINE: 57.05 mg/dL
Protein Creatinine Ratio: 0.91 mg/mg{Cre} — ABNORMAL HIGH (ref 0.00–0.15)
Total Protein, Urine: 52 mg/dL

## 2017-12-31 LAB — LACTATE DEHYDROGENASE: LDH: 124 U/L (ref 98–192)

## 2017-12-31 LAB — SAMPLE TO BLOOD BANK

## 2017-12-31 LAB — FERRITIN: FERRITIN: 827 ng/mL — AB (ref 24–336)

## 2017-12-31 MED ORDER — BORTEZOMIB CHEMO SQ INJECTION 3.5 MG (2.5MG/ML)
1.3000 mg/m2 | Freq: Once | INTRAMUSCULAR | Status: AC
  Administered 2017-12-31: 2.75 mg via SUBCUTANEOUS
  Filled 2017-12-31: qty 2.75

## 2017-12-31 MED ORDER — PROCHLORPERAZINE MALEATE 10 MG PO TABS
10.0000 mg | ORAL_TABLET | Freq: Once | ORAL | Status: AC
  Administered 2017-12-31: 10 mg via ORAL

## 2017-12-31 NOTE — Patient Instructions (Signed)
[facility] Discharge Instructions for Patients Receiving Chemotherapy  Today you received the following chemotherapy agents velcade.    If you develop nausea and vomiting that is not controlled by your nausea medication, call the clinic.   BELOW ARE SYMPTOMS THAT SHOULD BE REPORTED IMMEDIATELY:  *FEVER GREATER THAN 100.5 F  *CHILLS WITH OR WITHOUT FEVER  NAUSEA AND VOMITING THAT IS NOT CONTROLLED WITH YOUR NAUSEA MEDICATION  *UNUSUAL SHORTNESS OF BREATH  *UNUSUAL BRUISING OR BLEEDING  TENDERNESS IN MOUTH AND THROAT WITH OR WITHOUT PRESENCE OF ULCERS  *URINARY PROBLEMS  *BOWEL PROBLEMS  UNUSUAL RASH Items with * indicate a potential emergency and should be followed up as soon as possible.  Feel free to call the clinic should you have any questions or concerns. The clinic phone number is [phone].  Please show the CHEMO ALERT CARD at check-in to the Emergency Department and triage nurse.   

## 2017-12-31 NOTE — Progress Notes (Signed)
Patient for treatment today.  Stated new prn tingling in toes but denied trips or falls.  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  Able to use zippers, open jar lids, and pick up money change.  Colonoscopy scheduled for January 16, 2018 for prn blood noted in stool.  Denied SOB and pain.    Labs reviewed with Dr. Walden Field and ok to treat today.    Patient tolerated velcade shot with no complaints voiced.  Injection site clean and dry with no bruising or swelling noted at site.  No complaints of pain with shot.  Band aid applied.  VSS with discharge and left ambulatory with no s/s of distress noted.

## 2018-01-01 DIAGNOSIS — D638 Anemia in other chronic diseases classified elsewhere: Secondary | ICD-10-CM | POA: Diagnosis not present

## 2018-01-01 DIAGNOSIS — N184 Chronic kidney disease, stage 4 (severe): Secondary | ICD-10-CM | POA: Diagnosis not present

## 2018-01-01 DIAGNOSIS — R809 Proteinuria, unspecified: Secondary | ICD-10-CM | POA: Diagnosis not present

## 2018-01-01 LAB — VITAMIN D 25 HYDROXY (VIT D DEFICIENCY, FRACTURES): VIT D 25 HYDROXY: 37.4 ng/mL (ref 30.0–100.0)

## 2018-01-01 LAB — PARATHYROID HORMONE, INTACT (NO CA): PTH: 114 pg/mL — AB (ref 15–65)

## 2018-01-03 DIAGNOSIS — E1129 Type 2 diabetes mellitus with other diabetic kidney complication: Secondary | ICD-10-CM | POA: Diagnosis not present

## 2018-01-03 DIAGNOSIS — N184 Chronic kidney disease, stage 4 (severe): Secondary | ICD-10-CM | POA: Diagnosis not present

## 2018-01-03 DIAGNOSIS — Z6836 Body mass index (BMI) 36.0-36.9, adult: Secondary | ICD-10-CM | POA: Diagnosis not present

## 2018-01-03 DIAGNOSIS — C9000 Multiple myeloma not having achieved remission: Secondary | ICD-10-CM | POA: Diagnosis not present

## 2018-01-06 NOTE — Progress Notes (Signed)
Diagnosis Multiple myeloma not having achieved remission ([place]) - Plan: CBC with Differential/Platelet, Comprehensive metabolic panel, Lactate dehydrogenase  Staging Cancer Staging No matching staging information was found for the patient.  Assessment and Plan: 1.  MM, IgA Kappa.  He is being treated with Revlimid Velcade and low-dose Decadron under the direction of Dr. Lebron Conners.  Labs done today show hemoglobin 8.5.  WBC 7.4 and plts 182,000.  He is given a calendar today for Revlimid dosing.  He will RTC in 1 week to begin his next cycle.  Revlimid dose was decreased to 10 mg after visit with [facility] transplant team.  He is advised to notify the office if he has any significant issues prior to his next visit.  2.  Anemia.  Hemoglobin is improved at 8.5 after recent transfusion.  Will continue to monitor labs closely as therapy proceeds.    3.  Thrombocytopenia.  His platelet count is WNL at 182,000.  4.  Renal insufficiency.  His creatinine is 3.  Revlimid dosing decreased to 10 mg.    5.  Hyperkalemia.  Potassium improved at 4.2 on recent labs.     Interval history: 67 y.o. male with diagnosis of active multiple myeloma with IgA kappa.  He is being treated with Revlimid, bortezomib, and low-dose dexamethasone under the direction of Dr. Lebron Conners.  Previous cycle of therapy complicated by grade 3 anemia necessitating holding of bortezomib per Dr. Lebron Conners.    Current status: The patient is seen today for follow-up and evaluation prior to cycle 2-day 1 of Velcade Decadron and Revlimid.  He was seen at [facility] with modification of Revlimid dosing.    Problem List Patient Active Problem List   Diagnosis Date Noted  . Hematochezia [K92.1] 12/18/2017  . Multiple myeloma ([place]) [C90.00] 08/29/2017  . CKD (chronic kidney disease) [N18.9] 08/29/2017  . Symptomatic anemia [D64.9]   . 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 AKI (acute kidney injury) ([place]) [N17.9] 07/24/2017  . Closed fracture of  transverse process of lumbar vertebra, with delayed healing, subsequent encounter [S32.009G] 07/24/2017  . Renal failure [N19] 07/23/2017  . Hypercalcemia [E83.52] 07/23/2017  . Anemia [D64.9] 07/23/2017  . Thrombocytopenia ([place]) [D69.6] 07/23/2017  . Diabetes mellitus type 2 in obese ([place]) [E11.69, E66.9] 07/23/2017    Past Medical History Past Medical History:  Diagnosis Date  . Cancer ([place])    multiple myeloma  . Cancer of right kidney ([place])   . Cervical dystonia   . Diabetes mellitus without complication [facility])     Past Surgical History Past Surgical History:  Procedure Laterality Date  . CHOLECYSTECTOMY  2007  . NEPHRECTOMY Right 1998   cancer    Family History Family History  Problem Relation Age of Onset  . Heart failure Mother 95  . Dementia Father   . Colon cancer Neg Hx      Social History  reports that  has never smoked. His smokeless tobacco use includes chew. He reports that he does not drink alcohol or use drugs.  Medications  Current Outpatient Medications:  .  acyclovir (ZOVIRAX) 400 MG tablet, Take 1 tablet (400 mg total) 2 (two) times daily by mouth. (Patient taking differently: Take 400 mg by mouth daily. ), Disp: 60 tablet, Rfl: 3 .  allopurinol (ZYLOPRIM) 300 MG tablet, Take 300 mg by mouth every morning., Disp: , Rfl:  .  aspirin EC 81 MG tablet, Take 81 mg daily by mouth., Disp: , Rfl:  .  cyclobenzaprine (FLEXERIL) 10 MG tablet, Take  10 mg by mouth at bedtime., Disp: , Rfl:  .  dexamethasone (DECADRON) 4 MG tablet, Take 10 tablets (40 mg) on days 1 and 8 of chemo (velcade). Repeat every 21 days., Disp: 30 tablet, Rfl: 3 .  furosemide (LASIX) 40 MG tablet, Take 40 mg by mouth every Monday, Wednesday, and Friday. , Disp: , Rfl:  .  glipiZIDE (GLIPIZIDE XL) 2.5 MG 24 hr tablet, Take 1 tablet (2.5 mg total) by mouth daily with breakfast., Disp: 30 tablet, Rfl: 0 .  lenalidomide (REVLIMID) 10 MG capsule, Take 1 capsule (10 mg) days 1-14. Then 7 days  off.  Every 21 days. (Patient taking differently: Take 10 mg by mouth See admin instructions. Take 1 capsule (10 mg) days 1-14. Then 7 days off.  Every 21 days.), Disp: 14 capsule, Rfl: 0 .  Multiple Vitamins-Minerals (CENTRUM SILVER 50+MEN) TABS, Take 1 tablet by mouth every morning., Disp: , Rfl:  .  Na Sulfate-K Sulfate-Mg Sulf (SUPREP BOWEL PREP KIT) 17.5-3.13-1.6 GM/177ML SOLN, Take 1 kit by mouth as directed., Disp: 1 Bottle, Rfl: 0 .  pravastatin (PRAVACHOL) 20 MG tablet, Take 20 mg by mouth at bedtime. , Disp: , Rfl:  .  sodium bicarbonate 650 MG tablet, Take 1 tablet (650 mg total) by mouth 2 (two) times daily. (Patient taking differently: Take 650 mg by mouth 3 (three) times daily. ), Disp: 60 tablet, Rfl: 1 .  traMADol (ULTRAM) 50 MG tablet, Take 1 tablet (50 mg total) every 6 (six) hours as needed by mouth. (Patient not taking: Reported on 01/02/2018), Disp: 30 tablet, Rfl: 0 No current facility-administered medications for this visit.   Facility-Administered Medications Ordered in Other Visits:  .  0.9 %  sodium chloride infusion, 250 mL, Intravenous, Once, Twana First, MD  Allergies Patient has no known allergies.  Review of Systems Review of Systems - Oncology ROS as per HPI otherwise 12 point ROS is negative.   Physical Exam  Vitals Wt Readings from Last 3 Encounters:  12/31/17 207 lb 3.2 oz (94 kg)  12/24/17 212 lb 6.4 oz (96.3 kg)  12/19/17 210 lb 3.2 oz (95.3 kg)   Temp Readings from Last 3 Encounters:  12/31/17 97.6 F (36.4 C) (Oral)  12/24/17 98 F (36.7 C) (Oral)  12/19/17 98.6 F (37 C)   BP Readings from Last 3 Encounters:  12/31/17 (!) 148/64  12/24/17 (!) 138/56  12/19/17 140/66   Pulse Readings from Last 3 Encounters:  12/31/17 85  12/24/17 85  12/19/17 80   Constitutional: Well-developed, well-nourished, and in no distress.   HENT: Head: Normocephalic and atraumatic.  Mouth/Throat: No oropharyngeal exudate. Mucosa moist. Eyes: Pupils are  equal, round, and reactive to light. Conjunctivae are normal. No scleral icterus.  Neck: Normal range of motion. Neck supple. No JVD present.  Cardiovascular: Normal rate, regular rhythm and normal heart sounds.  Exam reveals no gallop and no friction rub.   No murmur heard. Pulmonary/Chest: Effort normal and breath sounds normal. No respiratory distress. No wheezes.No rales.  Abdominal: Soft. Bowel sounds are normal. No distension. There is no tenderness. There is no guarding.  Musculoskeletal: No edema or tenderness.  Lymphadenopathy: No cervical, axillary or supraclavicular adenopathy.  Neurological: Alert and oriented to person, place, and time. No cranial nerve deficit.  Skin: Skin is warm and dry. No rash noted. No erythema. No pallor.  Psychiatric: Affect and judgment normal.   Labs Appointment on 12/24/2017  Component Date Value Ref Range Status  . Blood Bank Specimen  12/24/2017 SAMPLE AVAILABLE FOR TESTING   Final  . Sample Expiration 12/24/2017    Final                   Value:12/27/2017 Performed at [facility], [address] [place], [place] [address]   . WBC 12/24/2017 7.4  4.0 - 10.5 K/uL Final  . RBC 12/24/2017 2.57* 4.22 - 5.81 MIL/uL Final  . Hemoglobin 12/24/2017 8.5* 13.0 - 17.0 g/dL Final  . HCT 12/24/2017 25.7* 39.0 - 52.0 % Final  . MCV 12/24/2017 100.0  78.0 - 100.0 fL Final  . MCH 12/24/2017 33.1  26.0 - 34.0 pg Final  . MCHC 12/24/2017 33.1  30.0 - 36.0 g/dL Final  . RDW 12/24/2017 19.2* 11.5 - 15.5 % Final  . Platelets 12/24/2017 182  150 - 400 K/uL Final  . Neutrophils Relative % 12/24/2017 90  % Final  . Neutro Abs 12/24/2017 6.6  1.7 - 7.7 K/uL Final  . Lymphocytes Relative 12/24/2017 8  % Final  . Lymphs Abs 12/24/2017 0.6* 0.7 - 4.0 K/uL Final  . Monocytes Relative 12/24/2017 2  % Final  . Monocytes Absolute 12/24/2017 0.2  0.1 - 1.0 K/uL Final  . Eosinophils Relative 12/24/2017 0  % Final  . Eosinophils Absolute 12/24/2017 0.0  0.0 - 0.7 K/uL  Final  . Basophils Relative 12/24/2017 0  % Final  . Basophils Absolute 12/24/2017 0.0  0.0 - 0.1 K/uL Final   Performed at [facility], [address], [place], [place] [address]     Pathology Orders Placed This Encounter  Procedures  . CBC with Differential/Platelet    Standing Status:   Future    Standing Expiration Date:   12/24/2018  . Comprehensive metabolic panel    Standing Status:   Future    Standing Expiration Date:   12/24/2018  . Lactate dehydrogenase    Standing Status:   Future    Number of Occurrences:   1    Standing Expiration Date:   12/24/2018       Zoila Shutter MD

## 2018-01-06 NOTE — Patient Instructions (Signed)
Tom Marshall  01/06/2018     @PREFPERIOPPHARMACY @   Your procedure is scheduled on  01/16/2018 .  Report to [name] at  1145   A.M.  Call this number if you have problems the morning of surgery:  [phone]   Remember:  Do not eat food or drink liquids after midnight.  Take these medicines the morning of surgery with A SIP OF WATER  allopurinol.   Do not wear jewelry, make-up or nail polish.  Do not wear lotions, powders, or perfumes, or deodorant.  Do not shave 48 hours prior to surgery.  Men may shave face and neck.  Do not bring valuables to the hospital.  [facility] is not responsible for any belongings or valuables.  Contacts, dentures or bridgework may not be worn into surgery.  Leave your suitcase in the car.  After surgery it may be brought to your room.  For patients admitted to the hospital, discharge time will be determined by your treatment team.  Patients discharged the day of surgery will not be allowed to drive home.   Name and phone number of your driver:   family Special instructions:  Follow the diet and prep instructions given to you by Dr Roseanne Kaufman office.  Please read over the following fact sheets that you were given. Anesthesia Post-op Instructions and Care and Recovery After Surgery       Esophagogastroduodenoscopy Esophagogastroduodenoscopy (EGD) is a procedure to examine the lining of the esophagus, stomach, and first part of the small intestine (duodenum). This procedure is done to check for problems such as inflammation, bleeding, ulcers, or growths. During this procedure, a long, flexible, lighted tube with a camera attached (endoscope) is inserted down the throat. Tell a health care provider about:  Any allergies you have.  All medicines you are taking, including vitamins, herbs, eye drops, creams, and over-the-counter medicines.  Any problems you or family members have had with anesthetic medicines.  Any  blood disorders you have.  Any surgeries you have had.  Any medical conditions you have.  Whether you are pregnant or may be pregnant. What are the risks? Generally, this is a safe procedure. However, problems may occur, including:  Infection.  Bleeding.  A tear (perforation) in the esophagus, stomach, or duodenum.  Trouble breathing.  Excessive sweating.  Spasms of the larynx.  A slowed heartbeat.  Low blood pressure.  What happens before the procedure?  Follow instructions from your health care provider about eating or drinking restrictions.  Ask your health care provider about: ? Changing or stopping your regular medicines. This is especially important if you are taking diabetes medicines or blood thinners. ? Taking medicines such as aspirin and ibuprofen. These medicines can thin your blood. Do not take these medicines before your procedure if your health care provider instructs you not to.  Plan to have someone take you home after the procedure.  If you wear dentures, be ready to remove them before the procedure. What happens during the procedure?  To reduce your risk of infection, your health care team will wash or sanitize their hands.  An IV tube will be put in a vein in your hand or arm. You will get medicines and fluids through this tube.  You will be given one or more of the following: ? A medicine to help you relax (sedative). ? A medicine to numb the area (local anesthetic). This  medicine may be sprayed into your throat. It will make you feel more comfortable and keep you from gagging or coughing during the procedure. ? A medicine for pain.  A mouth guard may be placed in your mouth to protect your teeth and to keep you from biting on the endoscope.  You will be asked to lie on your left side.  The endoscope will be lowered down your throat into your esophagus, stomach, and duodenum.  Air will be put into the endoscope. This will help your health  care provider see better.  The lining of your esophagus, stomach, and duodenum will be examined.  Your health care provider may: ? Take a tissue sample so it can be looked at in a lab (biopsy). ? Remove growths. ? Remove objects (foreign bodies) that are stuck. ? Treat any bleeding with medicines or other devices that stop tissue from bleeding. ? Widen (dilate) or stretch narrowed areas of your esophagus and stomach.  The endoscope will be taken out. The procedure may vary among health care providers and hospitals. What happens after the procedure?  Your blood pressure, heart rate, breathing rate, and blood oxygen level will be monitored often until the medicines you were given have worn off.  Do not eat or drink anything until the numbing medicine has worn off and your gag reflex has returned. This information is not intended to replace advice given to you by your health care provider. Make sure you discuss any questions you have with your health care provider. Document Released: 02/15/2005 Document Revised: 03/22/2016 Document Reviewed: 09/08/2015 Elsevier Interactive Patient Education  2018 [employer]. Esophagogastroduodenoscopy, Care After Refer to this sheet in the next few weeks. These instructions provide you with information about caring for yourself after your procedure. Your health care provider may also give you more specific instructions. Your treatment has been planned according to current medical practices, but problems sometimes occur. Call your health care provider if you have any problems or questions after your procedure. What can I expect after the procedure? After the procedure, it is common to have:  A sore throat.  Nausea.  Bloating.  Dizziness.  Fatigue.  Follow these instructions at home:  Do not eat or drink anything until the numbing medicine (local anesthetic) has worn off and your gag reflex has returned. You will know that the local anesthetic has  worn off when you can swallow comfortably.  Do not drive for 24 hours if you received a medicine to help you relax (sedative).  If your health care provider took a tissue sample for testing during the procedure, make sure to get your test results. This is your responsibility. Ask your health care provider or the department performing the test when your results will be ready.  Keep all follow-up visits as told by your health care provider. This is important. Contact a health care provider if:  You cannot stop coughing.  You are not urinating.  You are urinating less than usual. Get help right away if:  You have trouble swallowing.  You cannot eat or drink.  You have throat or chest pain that gets worse.  You are dizzy or light-headed.  You faint.  You have nausea or vomiting.  You have chills.  You have a fever.  You have severe abdominal pain.  You have black, tarry, or bloody stools. This information is not intended to replace advice given to you by your health care provider. Make sure you discuss any questions you  have with your health care provider. Document Released: 10/01/2012 Document Revised: 03/22/2016 Document Reviewed: 09/08/2015 Elsevier Interactive Patient Education  2018 [employer].  Colonoscopy, Adult A colonoscopy is an exam to look at the large intestine. It is done to check for problems, such as:  Lumps (tumors).  Growths (polyps).  Swelling (inflammation).  Bleeding.  What happens before the procedure? Eating and drinking Follow instructions from your doctor about eating and drinking. These instructions may include:  A few days before the procedure - follow a low-fiber diet. ? Avoid nuts. ? Avoid seeds. ? Avoid dried fruit. ? Avoid raw fruits. ? Avoid vegetables.  1-3 days before the procedure - follow a clear liquid diet. Avoid liquids that have red or purple dye. Drink only clear liquids, such as: ? Clear broth or bouillon. ? Black  coffee or tea. ? Clear juice. ? Clear soft drinks or sports drinks. ? Gelatin dessert. ? Popsicles.  On the day of the procedure - do not eat or drink anything during the 2 hours before the procedure.  Bowel prep If you were prescribed an oral bowel prep:  Take it as told by your doctor. Starting the day before your procedure, you will need to drink a lot of liquid. The liquid will cause you to poop (have bowel movements) until your poop is almost clear or light green.  If your skin or butt gets irritated from diarrhea, you may: ? Wipe the area with wipes that have medicine in them, such as adult wet wipes with aloe and vitamin E. ? Put something on your skin that soothes the area, such as petroleum jelly.  If you throw up (vomit) while drinking the bowel prep, take a break for up to 60 minutes. Then begin the bowel prep again. If you keep throwing up and you cannot take the bowel prep without throwing up, call your doctor.  General instructions  Ask your doctor about changing or stopping your normal medicines. This is important if you take diabetes medicines or blood thinners.  Plan to have someone take you home from the hospital or clinic. What happens during the procedure?  An IV tube may be put into one of your veins.  You will be given medicine to help you relax (sedative).  To reduce your risk of infection: ? Your doctors will wash their hands. ? Your anal area will be washed with soap.  You will be asked to lie on your side with your knees bent.  Your doctor will get a long, thin, flexible tube ready. The tube will have a camera and a light on the end.  The tube will be put into your anus.  The tube will be gently put into your large intestine.  Air will be delivered into your large intestine to keep it open. You may feel some pressure or cramping.  The camera will be used to take photos.  A small tissue sample may be removed from your body to be looked at under a  microscope (biopsy). If any possible problems are found, the tissue will be sent to a lab for testing.  If small growths are found, your doctor may remove them and have them checked for cancer.  The tube that was put into your anus will be slowly removed. The procedure may vary among doctors and hospitals. What happens after the procedure?  Your doctor will check on you often until the medicines you were given have worn off.  Do not drive for  24 hours after the procedure.  You may have a small amount of blood in your poop.  You may pass gas.  You may have mild cramps or bloating in your belly (abdomen).  It is up to you to get the results of your procedure. Ask your doctor, or the department performing the procedure, when your results will be ready. This information is not intended to replace advice given to you by your health care provider. Make sure you discuss any questions you have with your health care provider. Document Released: 11/17/2010 Document Revised: 08/15/2016 Document Reviewed: 12/27/2015 Elsevier Interactive Patient Education  2017 [employer]  Colonoscopy, Adult, Care After This sheet gives you information about how to care for yourself after your procedure. Your health care provider may also give you more specific instructions. If you have problems or questions, contact your health care provider. What can I expect after the procedure? After the procedure, it is common to have:  A small amount of blood in your stool for 24 hours after the procedure.  Some gas.  Mild abdominal cramping or bloating.  Follow these instructions at home: General instructions   For the first 24 hours after the procedure: ? Do not drive or use machinery. ? Do not sign important documents. ? Do not drink alcohol. ? Do your regular daily activities at a slower pace than normal. ? Eat soft, easy-to-digest foods. ? Rest often.  Take over-the-counter or prescription medicines  only as told by your health care provider.  It is up to you to get the results of your procedure. Ask your health care provider, or the department performing the procedure, when your results will be ready. Relieving cramping and bloating  Try walking around when you have cramps or feel bloated.  Apply heat to your abdomen as told by your health care provider. Use a heat source that your health care provider recommends, such as a moist heat pack or a heating pad. ? Place a towel between your skin and the heat source. ? Leave the heat on for 20-30 minutes. ? Remove the heat if your skin turns bright red. This is especially important if you are unable to feel pain, heat, or cold. You may have a greater risk of getting burned. Eating and drinking  Drink enough fluid to keep your urine clear or pale yellow.  Resume your normal diet as instructed by your health care provider. Avoid heavy or fried foods that are hard to digest.  Avoid drinking alcohol for as long as instructed by your health care provider. Contact a health care provider if:  You have blood in your stool 2-3 days after the procedure. Get help right away if:  You have more than a small spotting of blood in your stool.  You pass large blood clots in your stool.  Your abdomen is swollen.  You have nausea or vomiting.  You have a fever.  You have increasing abdominal pain that is not relieved with medicine. This information is not intended to replace advice given to you by your health care provider. Make sure you discuss any questions you have with your health care provider. Document Released: 05/29/2004 Document Revised: 07/09/2016 Document Reviewed: 12/27/2015 Elsevier Interactive Patient Education  2018 [place] Anesthesia is a term that refers to techniques, procedures, and medicines that help a person stay safe and comfortable during a medical procedure. Monitored anesthesia care, or  sedation, is one type of anesthesia. Your anesthesia specialist  may recommend sedation if you will be having a procedure that does not require you to be unconscious, such as:  Cataract surgery.  A dental procedure.  A biopsy.  A colonoscopy.  During the procedure, you may receive a medicine to help you relax (sedative). There are three levels of sedation:  Mild sedation. At this level, you may feel awake and relaxed. You will be able to follow directions.  Moderate sedation. At this level, you will be sleepy. You may not remember the procedure.  Deep sedation. At this level, you will be asleep. You will not remember the procedure.  The more medicine you are given, the deeper your level of sedation will be. Depending on how you respond to the procedure, the anesthesia specialist may change your level of sedation or the type of anesthesia to fit your needs. An anesthesia specialist will monitor you closely during the procedure. Let your health care provider know about:  Any allergies you have.  All medicines you are taking, including vitamins, herbs, eye drops, creams, and over-the-counter medicines.  Any use of steroids (by mouth or as a cream).  Any problems you or family members have had with sedatives and anesthetic medicines.  Any blood disorders you have.  Any surgeries you have had.  Any medical conditions you have, such as sleep apnea.  Whether you are pregnant or may be pregnant.  Any use of cigarettes, alcohol, or street drugs. What are the risks? Generally, this is a safe procedure. However, problems may occur, including:  Getting too much medicine (oversedation).  Nausea.  Allergic reaction to medicines.  Trouble breathing. If this happens, a breathing tube may be used to help with breathing. It will be removed when you are awake and breathing on your own.  Heart trouble.  Lung trouble.  Before the procedure Staying hydrated Follow instructions from  your health care provider about hydration, which may include:  Up to 2 hours before the procedure - you may continue to drink clear liquids, such as water, clear fruit juice, black coffee, and plain tea.  Eating and drinking restrictions Follow instructions from your health care provider about eating and drinking, which may include:  8 hours before the procedure - stop eating heavy meals or foods such as meat, fried foods, or fatty foods.  6 hours before the procedure - stop eating light meals or foods, such as toast or cereal.  6 hours before the procedure - stop drinking milk or drinks that contain milk.  2 hours before the procedure - stop drinking clear liquids.  Medicines Ask your health care provider about:  Changing or stopping your regular medicines. This is especially important if you are taking diabetes medicines or blood thinners.  Taking medicines such as aspirin and ibuprofen. These medicines can thin your blood. Do not take these medicines before your procedure if your health care provider instructs you not to.  Tests and exams  You will have a physical exam.  You may have blood tests done to show: ? How well your kidneys and liver are working. ? How well your blood can clot.  General instructions  Plan to have someone take you home from the hospital or clinic.  If you will be going home right after the procedure, plan to have someone with you for 24 hours.  What happens during the procedure?  Your blood pressure, heart rate, breathing, level of pain and overall condition will be monitored.  An IV tube will be inserted  into one of your veins.  Your anesthesia specialist will give you medicines as needed to keep you comfortable during the procedure. This may mean changing the level of sedation.  The procedure will be performed. After the procedure  Your blood pressure, heart rate, breathing rate, and blood oxygen level will be monitored until the medicines  you were given have worn off.  Do not drive for 24 hours if you received a sedative.  You may: ? Feel sleepy, clumsy, or nauseous. ? Feel forgetful about what happened after the procedure. ? Have a sore throat if you had a breathing tube during the procedure. ? Vomit. This information is not intended to replace advice given to you by your health care provider. Make sure you discuss any questions you have with your health care provider. Document Released: 07/11/2005 Document Revised: 03/23/2016 Document Reviewed: 02/05/2016 Elsevier Interactive Patient Education  2018 [place], Care After These instructions provide you with information about caring for yourself after your procedure. Your health care provider may also give you more specific instructions. Your treatment has been planned according to current medical practices, but problems sometimes occur. Call your health care provider if you have any problems or questions after your procedure. What can I expect after the procedure? After your procedure, it is common to:  Feel sleepy for several hours.  Feel clumsy and have poor balance for several hours.  Feel forgetful about what happened after the procedure.  Have poor judgment for several hours.  Feel nauseous or vomit.  Have a sore throat if you had a breathing tube during the procedure.  Follow these instructions at home: For at least 24 hours after the procedure:   Do not: ? Participate in activities in which you could fall or become injured. ? Drive. ? Use heavy machinery. ? Drink alcohol. ? Take sleeping pills or medicines that cause drowsiness. ? Make important decisions or sign legal documents. ? Take care of children on your own.  Rest. Eating and drinking  Follow the diet that is recommended by your health care provider.  If you vomit, drink water, juice, or soup when you can drink without vomiting.  Make sure you have little  or no nausea before eating solid foods. General instructions  Have a responsible adult stay with you until you are awake and alert.  Take over-the-counter and prescription medicines only as told by your health care provider.  If you smoke, do not smoke without supervision.  Keep all follow-up visits as told by your health care provider. This is important. Contact a health care provider if:  You keep feeling nauseous or you keep vomiting.  You feel light-headed.  You develop a rash.  You have a fever. Get help right away if:  You have trouble breathing. This information is not intended to replace advice given to you by your health care provider. Make sure you discuss any questions you have with your health care provider. Document Released: 02/05/2016 Document Revised: 06/06/2016 Document Reviewed: 02/05/2016 Elsevier Interactive Patient Education  [employer].

## 2018-01-07 ENCOUNTER — Other Ambulatory Visit (HOSPITAL_COMMUNITY): Payer: Self-pay | Admitting: Emergency Medicine

## 2018-01-07 ENCOUNTER — Inpatient Hospital Stay (HOSPITAL_COMMUNITY): Payer: Medicare Other

## 2018-01-07 DIAGNOSIS — D696 Thrombocytopenia, unspecified: Secondary | ICD-10-CM

## 2018-01-07 DIAGNOSIS — D649 Anemia, unspecified: Secondary | ICD-10-CM | POA: Diagnosis not present

## 2018-01-07 DIAGNOSIS — N189 Chronic kidney disease, unspecified: Secondary | ICD-10-CM | POA: Diagnosis not present

## 2018-01-07 DIAGNOSIS — Z85528 Personal history of other malignant neoplasm of kidney: Secondary | ICD-10-CM | POA: Diagnosis not present

## 2018-01-07 DIAGNOSIS — C9000 Multiple myeloma not having achieved remission: Secondary | ICD-10-CM

## 2018-01-07 DIAGNOSIS — Z5111 Encounter for antineoplastic chemotherapy: Secondary | ICD-10-CM | POA: Diagnosis not present

## 2018-01-07 LAB — COMPREHENSIVE METABOLIC PANEL
ALT: 28 U/L (ref 17–63)
ANION GAP: 14 (ref 5–15)
AST: 22 U/L (ref 15–41)
Albumin: 3.9 g/dL (ref 3.5–5.0)
Alkaline Phosphatase: 151 U/L — ABNORMAL HIGH (ref 38–126)
BILIRUBIN TOTAL: 0.6 mg/dL (ref 0.3–1.2)
BUN: 41 mg/dL — ABNORMAL HIGH (ref 6–20)
CHLORIDE: 101 mmol/L (ref 101–111)
CO2: 21 mmol/L — ABNORMAL LOW (ref 22–32)
Calcium: 8.8 mg/dL — ABNORMAL LOW (ref 8.9–10.3)
Creatinine, Ser: 3.13 mg/dL — ABNORMAL HIGH (ref 0.61–1.24)
GFR, EST AFRICAN AMERICAN: 22 mL/min — AB (ref >60)
GFR, EST NON AFRICAN AMERICAN: 19 mL/min — AB (ref >60)
Glucose, Bld: 243 mg/dL — ABNORMAL HIGH (ref 65–99)
POTASSIUM: 4.3 mmol/L (ref 3.5–5.1)
Sodium: 136 mmol/L (ref 135–145)
TOTAL PROTEIN: 6.1 g/dL — AB (ref 6.5–8.1)

## 2018-01-07 LAB — CBC WITH DIFFERENTIAL/PLATELET
BASOS ABS: 0 10*3/uL (ref 0.0–0.1)
Basophils Relative: 0 %
EOS PCT: 2 %
Eosinophils Absolute: 0.1 10*3/uL (ref 0.0–0.7)
HEMATOCRIT: 24.6 % — AB (ref 39.0–52.0)
Hemoglobin: 8 g/dL — ABNORMAL LOW (ref 13.0–17.0)
LYMPHS PCT: 19 %
Lymphs Abs: 1 10*3/uL (ref 0.7–4.0)
MCH: 33.3 pg (ref 26.0–34.0)
MCHC: 32.5 g/dL (ref 30.0–36.0)
MCV: 102.5 fL — AB (ref 78.0–100.0)
MONO ABS: 0.5 10*3/uL (ref 0.1–1.0)
MONOS PCT: 11 %
Neutro Abs: 3.3 10*3/uL (ref 1.7–7.7)
Neutrophils Relative %: 68 %
PLATELETS: 109 10*3/uL — AB (ref 150–400)
RBC: 2.4 MIL/uL — ABNORMAL LOW (ref 4.22–5.81)
RDW: 18.8 % — AB (ref 11.5–15.5)
WBC: 4.9 10*3/uL (ref 4.0–10.5)

## 2018-01-07 LAB — SAMPLE TO BLOOD BANK

## 2018-01-07 MED ORDER — LENALIDOMIDE 10 MG PO CAPS: ORAL_CAPSULE | ORAL | 0 refills | Status: DC

## 2018-01-07 NOTE — Progress Notes (Signed)
Labs reviewed with Dr. Walden Field and Velcade injection held today per MD order. Pt continues to take his Revlimid as prescribed without any issues. Pt to return on 3/26 for MD appt and possible Velcade injection per MD. Pt given this information and verbalized understanding

## 2018-01-07 NOTE — Progress Notes (Signed)
revlimid refilled

## 2018-01-08 ENCOUNTER — Telehealth (HOSPITAL_COMMUNITY): Payer: Self-pay | Admitting: Emergency Medicine

## 2018-01-08 NOTE — Telephone Encounter (Signed)
Notified by Angie that pt would need a refill on his Revlimid.  Rx printed and given to Dr Walden Field to sign.  Dr Walden Field wanted to hold prescription due to kidney function going up.  Pt was notified that Dr Walden Field wants him to hold his revlimid until he come back in on the 26th.  He left me know that he needed a refill on his revlimid because he only had 4 pills.  I explained she wanted to wait and she would refill his prescription when she saw him on the 26th.  Pt verbalized understanding.

## 2018-01-09 ENCOUNTER — Other Ambulatory Visit (HOSPITAL_COMMUNITY): Payer: Self-pay

## 2018-01-09 DIAGNOSIS — C9000 Multiple myeloma not having achieved remission: Secondary | ICD-10-CM

## 2018-01-09 MED ORDER — ACYCLOVIR 400 MG PO TABS: 400.0000 mg | ORAL_TABLET | Freq: Two times a day (BID) | ORAL | 3 refills | Status: DC

## 2018-01-09 NOTE — Telephone Encounter (Signed)
Received refill request from patients pharmacy for acyclovir. Reviewed with provider, chart checked and refilled.

## 2018-01-10 ENCOUNTER — Encounter (HOSPITAL_COMMUNITY): Payer: Self-pay

## 2018-01-10 ENCOUNTER — Other Ambulatory Visit: Payer: Self-pay

## 2018-01-10 ENCOUNTER — Encounter (HOSPITAL_COMMUNITY)
Admission: RE | Admit: 2018-01-10 | Discharge: 2018-01-10 | Disposition: A | Discharge status: Home / Self Care | Payer: Medicare Other | Source: Ambulatory Visit | Attending: Internal Medicine | Admitting: Internal Medicine

## 2018-01-16 ENCOUNTER — Ambulatory Visit (HOSPITAL_COMMUNITY): Payer: Medicare Other | Admitting: Anesthesiology

## 2018-01-16 ENCOUNTER — Encounter (HOSPITAL_COMMUNITY)
Admission: RE | Disposition: A | Discharge status: Home / Self Care | Payer: Self-pay | Source: Ambulatory Visit | Attending: Internal Medicine

## 2018-01-16 ENCOUNTER — Telehealth: Payer: Self-pay | Admitting: Internal Medicine

## 2018-01-16 ENCOUNTER — Encounter (HOSPITAL_COMMUNITY): Payer: Self-pay | Admitting: *Deleted

## 2018-01-16 ENCOUNTER — Ambulatory Visit (HOSPITAL_COMMUNITY)
Admission: RE | Admit: 2018-01-16 | Discharge: 2018-01-16 | Disposition: A | Discharge status: Home / Self Care | Payer: Medicare Other | Source: Ambulatory Visit | Attending: Internal Medicine | Admitting: Internal Medicine

## 2018-01-16 DIAGNOSIS — K640 First degree hemorrhoids: Secondary | ICD-10-CM | POA: Diagnosis not present

## 2018-01-16 DIAGNOSIS — Z79899 Other long term (current) drug therapy: Secondary | ICD-10-CM | POA: Diagnosis not present

## 2018-01-16 DIAGNOSIS — Z7982 Long term (current) use of aspirin: Secondary | ICD-10-CM | POA: Insufficient documentation

## 2018-01-16 DIAGNOSIS — K922 Gastrointestinal hemorrhage, unspecified: Secondary | ICD-10-CM | POA: Diagnosis not present

## 2018-01-16 DIAGNOSIS — E119 Type 2 diabetes mellitus without complications: Secondary | ICD-10-CM | POA: Diagnosis not present

## 2018-01-16 DIAGNOSIS — K5730 Diverticulosis of large intestine without perforation or abscess without bleeding: Secondary | ICD-10-CM | POA: Diagnosis not present

## 2018-01-16 DIAGNOSIS — Z7984 Long term (current) use of oral hypoglycemic drugs: Secondary | ICD-10-CM | POA: Diagnosis not present

## 2018-01-16 DIAGNOSIS — Z8249 Family history of ischemic heart disease and other diseases of the circulatory system: Secondary | ICD-10-CM | POA: Insufficient documentation

## 2018-01-16 DIAGNOSIS — Z85528 Personal history of other malignant neoplasm of kidney: Secondary | ICD-10-CM | POA: Insufficient documentation

## 2018-01-16 DIAGNOSIS — D62 Acute posthemorrhagic anemia: Secondary | ICD-10-CM | POA: Diagnosis not present

## 2018-01-16 DIAGNOSIS — Z905 Acquired absence of kidney: Secondary | ICD-10-CM | POA: Insufficient documentation

## 2018-01-16 DIAGNOSIS — K921 Melena: Secondary | ICD-10-CM

## 2018-01-16 DIAGNOSIS — D649 Anemia, unspecified: Secondary | ICD-10-CM | POA: Diagnosis not present

## 2018-01-16 DIAGNOSIS — K274 Chronic or unspecified peptic ulcer, site unspecified, with hemorrhage: Secondary | ICD-10-CM

## 2018-01-16 HISTORY — PX: ESOPHAGOGASTRODUODENOSCOPY (EGD) WITH PROPOFOL: SHX5813

## 2018-01-16 HISTORY — PX: COLONOSCOPY WITH PROPOFOL: SHX5780

## 2018-01-16 LAB — GLUCOSE, CAPILLARY
GLUCOSE-CAPILLARY: 148 mg/dL — AB (ref 65–99)
Glucose-Capillary: 159 mg/dL — ABNORMAL HIGH (ref 65–99)

## 2018-01-16 SURGERY — COLONOSCOPY WITH PROPOFOL
Anesthesia: Monitor Anesthesia Care

## 2018-01-16 MED ORDER — LIDOCAINE VISCOUS 2 % MT SOLN
6.0000 mL | Freq: Once | OROMUCOSAL | Status: AC
  Administered 2018-01-16: 6 mL via OROMUCOSAL

## 2018-01-16 MED ORDER — MIDAZOLAM HCL 2 MG/2ML IJ SOLN
INTRAMUSCULAR | Status: AC
  Filled 2018-01-16: qty 2

## 2018-01-16 MED ORDER — CHLORHEXIDINE GLUCONATE CLOTH 2 % EX PADS: 6.0000 | MEDICATED_PAD | Freq: Once | CUTANEOUS | Status: DC

## 2018-01-16 MED ORDER — MIDAZOLAM HCL 2 MG/2ML IJ SOLN
1.0000 mg | INTRAMUSCULAR | Status: AC
  Administered 2018-01-16: 2 mg via INTRAVENOUS

## 2018-01-16 MED ORDER — FENTANYL CITRATE (PF) 100 MCG/2ML IJ SOLN
25.0000 ug | Freq: Once | INTRAMUSCULAR | Status: AC
Start: 2018-01-16 — End: 2018-01-16
  Administered 2018-01-16: 25 ug via INTRAVENOUS

## 2018-01-16 MED ORDER — LIDOCAINE VISCOUS 2 % MT SOLN
OROMUCOSAL | Status: AC
  Filled 2018-01-16: qty 15

## 2018-01-16 MED ORDER — MIDAZOLAM HCL 5 MG/5ML IJ SOLN
INTRAMUSCULAR | Status: DC | PRN
  Administered 2018-01-16: 2 mg via INTRAVENOUS

## 2018-01-16 MED ORDER — FENTANYL CITRATE (PF) 100 MCG/2ML IJ SOLN
INTRAMUSCULAR | Status: AC
  Filled 2018-01-16: qty 2

## 2018-01-16 MED ORDER — LACTATED RINGERS IV SOLN
INTRAVENOUS | Status: DC
  Administered 2018-01-16: 13:00:00 via INTRAVENOUS

## 2018-01-16 MED ORDER — PROPOFOL 500 MG/50ML IV EMUL
INTRAVENOUS | Status: DC | PRN
  Administered 2018-01-16: 13:00:00 via INTRAVENOUS
  Administered 2018-01-16: 125 ug/kg/min via INTRAVENOUS

## 2018-01-16 NOTE — Telephone Encounter (Signed)
Christine from [facility] Stay called to say that [facility] wants patient to have CBC done in 6 weeks. Pt has OV on 5/21.

## 2018-01-16 NOTE — Anesthesia Postprocedure Evaluation (Signed)
Anesthesia Post Note  Patient: JEOVANNI HEURING  Procedure(s) Performed: COLONOSCOPY WITH PROPOFOL (N/A ) ESOPHAGOGASTRODUODENOSCOPY (EGD) WITH PROPOFOL (N/A )  Patient location during evaluation: PACU Anesthesia Type: MAC Level of consciousness: awake and alert and patient cooperative Pain management: pain level controlled Vital Signs Assessment: post-procedure vital signs reviewed and stable Respiratory status: spontaneous breathing, nonlabored ventilation and respiratory function stable Cardiovascular status: blood pressure returned to baseline Postop Assessment: no apparent nausea or vomiting Anesthetic complications: no     Last Vitals:  Vitals Value Taken Time  BP 123/70 01/16/2018  1:32 PM  Temp    Pulse 72 01/16/2018  1:37 PM  Resp 14 01/16/2018  1:37 PM  SpO2 100 % 01/16/2018  1:37 PM  Vitals shown include unvalidated device data.  Last Pain:  Vitals:   01/16/18 1203  TempSrc: Oral  PainSc: 8                  Sahalie Beth J

## 2018-01-16 NOTE — Transfer of Care (Signed)
Immediate Anesthesia Transfer of Care Note  Patient: Tom Marshall  Procedure(s) Performed: COLONOSCOPY WITH PROPOFOL (N/A ) ESOPHAGOGASTRODUODENOSCOPY (EGD) WITH PROPOFOL (N/A )  Patient Location: PACU  Anesthesia Type:MAC  Level of Consciousness: awake and patient cooperative  Airway & Oxygen Therapy: Patient Spontanous Breathing and Patient connected to face mask oxygen  Post-op Assessment: Report given to RN, Post -op Vital signs reviewed and stable and Patient moving all extremities  Post vital signs: Reviewed and stable  Last Vitals:  Vitals Value Taken Time  BP    Temp    Pulse 84 01/16/2018  1:33 PM  Resp    SpO2 98 % 01/16/2018  1:33 PM  Vitals shown include unvalidated device data.  Last Pain:  Vitals:   01/16/18 1203  TempSrc: Oral  PainSc: 8          Complications: No apparent anesthesia complications

## 2018-01-16 NOTE — Op Note (Signed)
[facility] Patient Name: Tom Marshall Procedure Date: 01/16/2018 1:18 PM MRN: [id number] Date of Birth: 23-Dec-1950 Attending MD: Norvel Richards , MD CSN: [id number] Age: 67 Admit Type: Outpatient Procedure:                Upper GI endoscopy Indications:              Acute post hemorrhagic anemia, Hematochezia Providers:                Norvel Richards, MD, Hinton Rao, RN, Aram Candela Referring MD:             Asencion Noble Medicines:                Propofol per Anesthesia Complications:            No immediate complications. Estimated Blood Loss:     Estimated blood loss: none. Procedure:                Pre-Anesthesia Assessment:                           - Prior to the procedure, a History and Physical                            was performed, and patient medications and                            allergies were reviewed. The patient's tolerance of                            previous anesthesia was also reviewed. The risks                            and benefits of the procedure and the sedation                            options and risks were discussed with the patient.                            All questions were answered, and informed consent                            was obtained. Prior Anticoagulants: The patient has                            taken no previous anticoagulant or antiplatelet                            agents. ASA Grade Assessment: III - A patient with                            severe systemic disease. After reviewing the risks  and benefits, the patient was deemed in                            satisfactory condition to undergo the procedure.                           After obtaining informed consent, the endoscope was                            passed under direct vision. Throughout the                            procedure, the patient's blood pressure, pulse, and     oxygen saturations were monitored continuously. The                            [id number] ([id number]) scope was introduced through the                            and advanced to the second part of duodenum. The                            patient tolerated the procedure well. The upper GI                            endoscopy was accomplished without difficulty. Scope In: 1:20:19 PM Scope Out: 1:23:59 PM Total Procedure Duration: 0 hours 3 minutes 40 seconds  Findings:      The examined esophagus was normal.      The entire examined stomach was normal.      The duodenal bulb, second portion of the duodenum and examined duodenum       were normal. Biopsies were taken with a cold forceps for histology. Impression:               - Normal esophagus.                           - Normal stomach.                           - Normal duodenal bulb, second portion of the                            duodenum Moderate Sedation:      Moderate (conscious) sedation was personally administered by an       anesthesia professional. The following parameters were monitored: oxygen       saturation, heart rate, blood pressure, respiratory rate, EKG, adequacy       of pulmonary ventilation, and response to care. Total physician       intraservice time was 27 minutes. Recommendation:           - No repeat upper endoscopy.                           - Return to GI office (date not yet determined).  Consider small bowel video capsule study to                            complete evaluation of GI bleeding                           - Advance diet as tolerated.                           - Continue present medications. Procedure Code(s):        --- Professional ---                           [phone], Esophagogastroduodenoscopy, flexible,                            transoral; with biopsy, single or multiple Diagnosis Code(s):        --- Professional ---                           D62, Acute  posthemorrhagic anemia                           K92.1, Melena (includes Hematochezia) CPT copyright 2016 American Medical Association. All rights reserved. The codes documented in this report are preliminary and upon coder review may  be revised to meet current compliance requirements. [name]. [name], MD Norvel Richards, MD 01/16/2018 1:35:52 PM This report has been signed electronically. Number of Addenda: 0

## 2018-01-16 NOTE — Op Note (Signed)
[facility] Patient Name: Tom Marshall Procedure Date: 01/16/2018 11:55 AM MRN: [id number] Date of Birth: 1951-05-20 Attending MD: Norvel Richards , MD CSN: [id number] Age: 67 Admit Type: Outpatient Procedure:                Colonoscopy Indications:              Hematochezia Providers:                Norvel Richards, MD, Hinton Rao, RN, Aram Candela Referring MD:              Medicines:                Propofol per Anesthesia Complications:            No immediate complications. Estimated Blood Loss:     Estimated blood loss: none. Procedure:                Pre-Anesthesia Assessment:                           - Prior to the procedure, a History and Physical                            was performed, and patient medications and                            allergies were reviewed. The patient's tolerance of                            previous anesthesia was also reviewed. The risks                            and benefits of the procedure and the sedation                            options and risks were discussed with the patient.                            All questions were answered, and informed consent                            was obtained. Prior Anticoagulants: The patient has                            taken no previous anticoagulant or antiplatelet                            agents. ASA Grade Assessment: III - A patient with                            severe systemic disease. After reviewing the risks                            and benefits,  the patient was deemed in                            satisfactory condition to undergo the procedure.                           After obtaining informed consent, the colonoscope                            was passed under direct vision. Throughout the                            procedure, the patient's blood pressure, pulse, and                            oxygen saturations were monitored  continuously. The                            [id number] ([id number]) was introduced through the and                            advanced to the 5 cm into the ileum. The                            colonoscopy was performed without difficulty. The                            patient tolerated the procedure well. The quality                            of the bowel preparation was adequate. The terminal                            ileum, ileocecal valve, appendiceal orifice, and                            rectum were photographed. The entire colon was well                            visualized. Scope In: 12:59:59 PM Scope Out: 1:16:24 PM Scope Withdrawal Time: 0 hours 13 minutes 37 seconds  Total Procedure Duration: 0 hours 16 minutes 25 seconds  Findings:      The perianal and digital rectal examinations were normal.      Scattered small-mouthed diverticula were found in the sigmoid colon and       descending colon.      Non-bleeding internal hemorrhoids were found during retroflexion. The       hemorrhoids were moderate, medium-sized and Grade I (internal       hemorrhoids that do not prolapse).      The exam was otherwise without abnormality on direct and retroflexion       views. Distal 10 cm of terminal ileum mucosa appeared normal. Impression:               - Diverticulosis in the sigmoid colon and in the  descending colon.                           - Non-bleeding internal hemorrhoids.                           - The examination was otherwise normal on direct                            and retroflexion views.                           - No specimens collected. Moderate Sedation:      Moderate (conscious) sedation was personally administered by an       anesthesia professional. The following parameters were monitored: oxygen       saturation, heart rate, blood pressure, respiratory rate, EKG, adequacy       of pulmonary ventilation, and response to care. Total  physician       intraservice time was 20 minutes. Recommendation:           - Patient has a contact number available for                            emergencies. The signs and symptoms of potential                            delayed complications were discussed with the                            patient. Return to normal activities tomorrow.                            Written discharge instructions were provided to the                            patient.                           - Resume previous diet.                           - Continue present medications.                           - Repeat colonoscopy in 10 years for screening                            purposes. See EGD note.                           - Return to GI clinic PRN. Procedure Code(s):        --- Professional ---                           [phone], Colonoscopy, flexible; diagnostic, including  collection of specimen(s) by brushing or washing,                            when performed (separate procedure) Diagnosis Code(s):        --- Professional ---                           K64.0, First degree hemorrhoids                           K92.1, Melena (includes Hematochezia)                           K57.30, Diverticulosis of large intestine without                            perforation or abscess without bleeding CPT copyright 2016 American Medical Association. All rights reserved. The codes documented in this report are preliminary and upon coder review may  be revised to meet current compliance requirements. [name]. Rourk, MD Norvel Richards, MD 01/16/2018 1:39:53 PM This report has been signed electronically. Number of Addenda: 0

## 2018-01-16 NOTE — Discharge Instructions (Signed)
Colonoscopy Discharge Instructions  Read the instructions outlined below and refer to this sheet in the next few weeks. These discharge instructions provide you with general information on caring for yourself after you leave the hospital. Your doctor may also give you specific instructions. While your treatment has been planned according to the most current medical practices available, unavoidable complications occasionally occur. If you have any problems or questions after discharge, call Dr. Gala Romney at [phone]. ACTIVITY  You may resume your regular activity, but move at a slower pace for the next 24 hours.   Take frequent rest periods for the next 24 hours.   Walking will help get rid of the air and reduce the bloated feeling in your belly (abdomen).   No driving for 24 hours (because of the medicine (anesthesia) used during the test).    Do not sign any important legal documents or operate any machinery for 24 hours (because of the anesthesia used during the test).  NUTRITION  Drink plenty of fluids.   You may resume your normal diet as instructed by your doctor.   Begin with a light meal and progress to your normal diet. Heavy or fried foods are harder to digest and may make you feel sick to your stomach (nauseated).   Avoid alcoholic beverages for 24 hours or as instructed.  MEDICATIONS  You may resume your normal medications unless your doctor tells you otherwise.  WHAT YOU CAN EXPECT TODAY  Some feelings of bloating in the abdomen.   Passage of more gas than usual.   Spotting of blood in your stool or on the toilet paper.  IF YOU HAD POLYPS REMOVED DURING THE COLONOSCOPY:  No aspirin products for 7 days or as instructed.   No alcohol for 7 days or as instructed.   Eat a soft diet for the next 24 hours.  FINDING OUT THE RESULTS OF YOUR TEST Not all test results are available during your visit. If your test results are not back during the visit, make an appointment  with your caregiver to find out the results. Do not assume everything is normal if you have not heard from your caregiver or the medical facility. It is important for you to follow up on all of your test results.  SEEK IMMEDIATE MEDICAL ATTENTION IF:  You have more than a spotting of blood in your stool.   Your belly is swollen (abdominal distention).   You are nauseated or vomiting.   You have a temperature over 101.   You have abdominal pain or discomfort that is severe or gets worse throughout the day.  EGD Discharge instructions Please read the instructions outlined below and refer to this sheet in the next few weeks. These discharge instructions provide you with general information on caring for yourself after you leave the hospital. Your doctor may also give you specific instructions. While your treatment has been planned according to the most current medical practices available, unavoidable complications occasionally occur. If you have any problems or questions after discharge, please call your doctor. ACTIVITY  You may resume your regular activity but move at a slower pace for the next 24 hours.   Take frequent rest periods for the next 24 hours.   Walking will help expel (get rid of) the air and reduce the bloated feeling in your abdomen.   No driving for 24 hours (because of the anesthesia (medicine) used during the test).   You may shower.   Do not sign any  important legal documents or operate any machinery for 24 hours (because of the anesthesia used during the test).  NUTRITION  Drink plenty of fluids.   You may resume your normal diet.   Begin with a light meal and progress to your normal diet.   Avoid alcoholic beverages for 24 hours or as instructed by your caregiver.  MEDICATIONS  You may resume your normal medications unless your caregiver tells you otherwise.  WHAT YOU CAN EXPECT TODAY  You may experience abdominal discomfort such as a feeling of fullness  or gas pains.  FOLLOW-UP  Your doctor will discuss the results of your test with you.  SEEK IMMEDIATE MEDICAL ATTENTION IF ANY OF THE FOLLOWING OCCUR:  Excessive nausea (feeling sick to your stomach) and/or vomiting.   Severe abdominal pain and distention (swelling).   Trouble swallowing.   Temperature over 101 F (37.8 C).   Rectal bleeding or vomiting of blood.    CBC and OV with [place] in 4-6 weeks   OFFICE NOTIFIED OF NEED FOR LABS     PATIENT INSTRUCTIONS POST-ANESTHESIA  IMMEDIATELY FOLLOWING SURGERY:  Do not drive or operate machinery for the first twenty four hours after surgery.  Do not make any important decisions for twenty four hours after surgery or while taking narcotic pain medications or sedatives.  If you develop intractable nausea and vomiting or a severe headache please notify your doctor immediately.  FOLLOW-UP:  Please make an appointment with your surgeon as instructed. You do not need to follow up with anesthesia unless specifically instructed to do so.  WOUND CARE INSTRUCTIONS (if applicable):  Keep a dry clean dressing on the anesthesia/puncture wound site if there is drainage.  Once the wound has quit draining you may leave it open to air.  Generally you should leave the bandage intact for twenty four hours unless there is drainage.  If the epidural site drains for more than 36-48 hours please call the anesthesia department.  QUESTIONS?:  Please feel free to call your physician or the hospital operator if you have any questions, and they will be happy to assist you.        Diverticulosis Diverticulosis is a condition that develops when small pouches (diverticula) form in the wall of the large intestine (colon). The colon is where water is absorbed and stool is formed. The pouches form when the inside layer of the colon pushes through weak spots in the outer layers of the colon. You may have a few pouches or many of them. What are the causes? The cause of  this condition is not known. What increases the risk? The following factors may make you more likely to develop this condition:  Being older than age 36. Your risk for this condition increases with age. Diverticulosis is rare among people younger than age 41. By age 39, many people have it.  Eating a low-fiber diet.  Having frequent constipation.  Being overweight.  Not getting enough exercise.  Smoking.  Taking over-the-counter pain medicines, like aspirin and ibuprofen.  Having a family history of diverticulosis.  What are the signs or symptoms? In most people, there are no symptoms of this condition. If you do have symptoms, they may include:  Bloating.  Cramps in the abdomen.  Constipation or diarrhea.  Pain in the lower left side of the abdomen.  How is this diagnosed? This condition is most often diagnosed during an exam for other colon problems. Because diverticulosis usually has no symptoms, it often cannot  be diagnosed independently. This condition may be diagnosed by:  Using a flexible scope to examine the colon (colonoscopy).  Taking an X-ray of the colon after dye has been put into the colon (barium enema).  Doing a CT scan.  How is this treated? You may not need treatment for this condition if you have never developed an infection related to diverticulosis. If you have had an infection before, treatment may include:  Eating a high-fiber diet. This may include eating more fruits, vegetables, and grains.  Taking a fiber supplement.  Taking a live bacteria supplement (probiotic).  Taking medicine to relax your colon.  Taking antibiotic medicines.  Follow these instructions at home:  Drink 6-8 glasses of water or more each day to prevent constipation.  Try not to strain when you have a bowel movement.  If you have had an infection before: ? Eat more fiber as directed by your health care provider or your diet and nutrition specialist  (dietitian). ? Take a fiber supplement or probiotic, if your health care provider approves.  Take over-the-counter and prescription medicines only as told by your health care provider.  If you were prescribed an antibiotic, take it as told by your health care provider. Do not stop taking the antibiotic even if you start to feel better.  Keep all follow-up visits as told by your health care provider. This is important. Contact a health care provider if:  You have pain in your abdomen.  You have bloating.  You have cramps.  You have not had a bowel movement in 3 days. Get help right away if:  Your pain gets worse.  Your bloating becomes very bad.  You have a fever or chills, and your symptoms suddenly get worse.  You vomit.  You have bowel movements that are bloody or black.  You have bleeding from your rectum. Summary  Diverticulosis is a condition that develops when small pouches (diverticula) form in the wall of the large intestine (colon).  You may have a few pouches or many of them.  This condition is most often diagnosed during an exam for other colon problems.  If you have had an infection related to diverticulosis, treatment may include increasing the fiber in your diet, taking supplements, or taking medicines. This information is not intended to replace advice given to you by your health care provider. Make sure you discuss any questions you have with your health care provider. Document Released: 07/12/2004 Document Revised: 09/03/2016 Document Reviewed: 09/03/2016 Elsevier Interactive Patient Education  2017 [employer].

## 2018-01-16 NOTE — H&P (Signed)
'@LOGO' @   Primary Care Physician:  Asencion Noble, MD Primary Gastroenterologist:  Dr. Gala Romney  Pre-Procedure History & Physical: HPI:  Tom Marshall is a 67 y.o. male here for  Further evaluation of hematochezia and anemia via colonoscopy and possible EGD. Patient denies dysphagia.  Past Medical History:  Diagnosis Date  . Cancer ([place])    multiple myeloma  . Cancer of right kidney ([place])   . Cervical dystonia   . Diabetes mellitus without complication [facility])     Past Surgical History:  Procedure Laterality Date  . CHOLECYSTECTOMY  2007  . NEPHRECTOMY Right 1998   cancer    Prior to Admission medications   Medication Sig Start Date End Date Taking? Authorizing Provider  allopurinol (ZYLOPRIM) 300 MG tablet Take 300 mg by mouth every morning.   Yes [provider]  aspirin EC 81 MG tablet Take 81 mg daily by mouth.   Yes [provider]  cyclobenzaprine (FLEXERIL) 10 MG tablet Take 10 mg by mouth at bedtime.   Yes [provider]  dexamethasone (DECADRON) 4 MG tablet Take 10 tablets (40 mg) on days 1 and 8 of chemo (velcade). Repeat every 21 days. 12/10/17  Yes Higgs, Mathis Dad, MD  furosemide (LASIX) 40 MG tablet Take 40 mg by mouth every Monday, Wednesday, and Friday.  10/28/17  Yes [provider]  glipiZIDE (GLIPIZIDE XL) 2.5 MG 24 hr tablet Take 1 tablet (2.5 mg total) by mouth daily with breakfast. 07/31/17  Yes Kathie Dike, MD  lenalidomide (REVLIMID) 10 MG capsule Take 1 capsule (10 mg) days 1-14. Then 7 days off.  Every 21 days. 01/07/18  Yes Higgs, Mathis Dad, MD  Multiple Vitamins-Minerals (CENTRUM SILVER 50+MEN) TABS Take 1 tablet by mouth every morning.   Yes [provider]  Na Sulfate-K Sulfate-Mg Sulf (SUPREP BOWEL PREP KIT) 17.5-3.13-1.6 GM/177ML SOLN Take 1 kit by mouth as directed. 12/18/17  Yes Kynley Metzger, [name], MD  pravastatin (PRAVACHOL) 20 MG tablet Take 20 mg by mouth at bedtime.    Yes [provider]  sodium  bicarbonate 650 MG tablet Take 1 tablet (650 mg total) by mouth 2 (two) times daily. Patient taking differently: Take 650 mg by mouth 3 (three) times daily.  07/31/17  Yes Kathie Dike, MD  acyclovir (ZOVIRAX) 400 MG tablet Take 1 tablet (400 mg total) by mouth 2 (two) times daily. 01/09/18   Holley Bouche, NP  traMADol (ULTRAM) 50 MG tablet Take 1 tablet (50 mg total) every 6 (six) hours as needed by mouth. Patient not taking: Reported on 01/02/2018 09/12/17   Holley Bouche, NP  prochlorperazine (COMPAZINE) 10 MG tablet Take 1 tablet (10 mg total) by mouth every 6 (six) hours as needed (Nausea or vomiting). 08/29/17 09/05/17  Twana First, MD    Allergies as of 12/18/2017  . (No Known Allergies)    Family History  Problem Relation Age of Onset  . Heart failure Mother 30  . Dementia Father   . Colon cancer Neg Hx     Social History   Socioeconomic History  . Marital status: Single    Spouse name: Not on file  . Number of children: Not on file  . Years of education: Not on file  . Highest education level: Not on file  Occupational History  . Occupation: Marine scientist, Medical laboratory scientific officer Needs  . Financial resource strain: Not on file  . Food insecurity:    Worry: Not on file    Inability: Not  on file  . Transportation needs:    Medical: Not on file    Non-medical: Not on file  Tobacco Use  . Smoking status: Never Smoker  . Smokeless tobacco: Former Systems developer    Types: Chew  Substance and Sexual Activity  . Alcohol use: No  . Drug use: No  . Sexual activity: Not Currently  Lifestyle  . Physical activity:    Days per week: Not on file    Minutes per session: Not on file  . Stress: Not on file  Relationships  . Social connections:    Talks on phone: Not on file    Gets together: Not on file    Attends religious service: Not on file    Active member of club or organization: Not on file    Attends meetings of clubs or organizations: Not on file    Relationship  status: Not on file  . Intimate partner violence:    Fear of current or ex partner: Not on file    Emotionally abused: Not on file    Physically abused: Not on file    Forced sexual activity: Not on file  Other Topics Concern  . Not on file  Social History Narrative  . Not on file    Review of Systems: See HPI, otherwise negative ROS  Physical Exam: BP (!) 149/74   Pulse 71   Temp 97.8 F (36.6 C) (Oral)   SpO2 97%  General:   Alert,  Well-developed, well-nourished, pleasant and cooperative in NAD Neck:  Supple; no masses or thyromegaly. No significant cervical adenopathy. Lungs:  Clear throughout to auscultation.   No wheezes, crackles, or rhonchi. No acute distress. Heart:  Regular rate and rhythm; no murmurs, clicks, rubs,  or gallops. Abdomen: Non-distended, normal bowel sounds.  Soft and nontender without appreciable mass or hepatosplenomegaly.  Pulses:  Normal pulses noted. Extremities:  Without clubbing or edema.  Impression: Intermittent hematochezia and profound anemia  Recommendations:  I have offered the patient both a diagnostic colonoscopy and possible EGD today  The risks, benefits, limitations, imponderables and alternatives regarding both EGD and colonoscopy have been reviewed with the patient. Questions have been answered. All parties agreeable.    Notice: This dictation was prepared with Dragon dictation along with smaller phrase technology. Any transcriptional errors that result from this process are unintentional and may not be corrected upon review.

## 2018-01-16 NOTE — Anesthesia Preprocedure Evaluation (Signed)
Anesthesia Evaluation  Patient identified by MRN, date of birth, ID band Patient awake    Reviewed: Allergy & Precautions, NPO status , Patient's Chart, lab work & pertinent test results  Airway        Dental   Pulmonary neg pulmonary ROS,           Cardiovascular negative cardio ROS       Neuro/Psych    GI/Hepatic   Endo/Other  diabetes, Type 2  Renal/GU Renal disease ( Cancer of right kidney - nephrectomy )     Musculoskeletal   Abdominal   Peds  Hematology  (+) Blood dyscrasia ( Multiple myeloma ), anemia ,   Anesthesia Other Findings   Reproductive/Obstetrics                            Anesthesia Physical Anesthesia Plan  ASA: III  Anesthesia Plan: MAC   Post-op Pain Management:    Induction: Intravenous  PONV Risk Score and Plan:   Airway Management Planned: Simple Face Mask  Additional Equipment:   Intra-op Plan:   Post-operative Plan:   Informed Consent: I have reviewed the patients History and Physical, chart, labs and discussed the procedure including the risks, benefits and alternatives for the proposed anesthesia with the patient or authorized representative who has indicated his/her understanding and acceptance.     Plan Discussed with:   Anesthesia Plan Comments:         Anesthesia Quick Evaluation

## 2018-01-17 ENCOUNTER — Other Ambulatory Visit: Payer: Self-pay

## 2018-01-17 DIAGNOSIS — D649 Anemia, unspecified: Secondary | ICD-10-CM

## 2018-01-17 NOTE — Telephone Encounter (Signed)
Orders placed. Letter will be mailed to pt when its time to release lab.

## 2018-01-20 ENCOUNTER — Other Ambulatory Visit (HOSPITAL_COMMUNITY): Payer: Self-pay

## 2018-01-20 DIAGNOSIS — C9000 Multiple myeloma not having achieved remission: Secondary | ICD-10-CM

## 2018-01-20 MED ORDER — LENALIDOMIDE 10 MG PO CAPS
ORAL_CAPSULE | ORAL | 0 refills | Status: DC
Start: 2018-01-20 — End: 2018-02-05

## 2018-01-20 NOTE — Telephone Encounter (Signed)
Received refill request from patients pharmacy for Revlimid. Reviewed with provider, chart checked and refilled.

## 2018-01-21 ENCOUNTER — Other Ambulatory Visit (HOSPITAL_COMMUNITY): Payer: Medicare Other

## 2018-01-21 ENCOUNTER — Encounter (HOSPITAL_COMMUNITY): Payer: Self-pay | Admitting: Internal Medicine

## 2018-01-21 ENCOUNTER — Inpatient Hospital Stay (HOSPITAL_COMMUNITY): Payer: Medicare Other

## 2018-01-21 ENCOUNTER — Other Ambulatory Visit: Payer: Self-pay

## 2018-01-21 ENCOUNTER — Inpatient Hospital Stay (HOSPITAL_BASED_OUTPATIENT_CLINIC_OR_DEPARTMENT_OTHER): Payer: Medicare Other | Admitting: Internal Medicine

## 2018-01-21 VITALS — BP 148/71 | HR 80 | Temp 98.1°F | Resp 16 | Wt 211.9 lb

## 2018-01-21 DIAGNOSIS — Z85528 Personal history of other malignant neoplasm of kidney: Secondary | ICD-10-CM

## 2018-01-21 DIAGNOSIS — N189 Chronic kidney disease, unspecified: Secondary | ICD-10-CM | POA: Diagnosis not present

## 2018-01-21 DIAGNOSIS — E1122 Type 2 diabetes mellitus with diabetic chronic kidney disease: Secondary | ICD-10-CM | POA: Diagnosis not present

## 2018-01-21 DIAGNOSIS — E669 Obesity, unspecified: Secondary | ICD-10-CM | POA: Diagnosis not present

## 2018-01-21 DIAGNOSIS — Z5111 Encounter for antineoplastic chemotherapy: Secondary | ICD-10-CM | POA: Diagnosis not present

## 2018-01-21 DIAGNOSIS — Z79899 Other long term (current) drug therapy: Secondary | ICD-10-CM | POA: Diagnosis not present

## 2018-01-21 DIAGNOSIS — Z7982 Long term (current) use of aspirin: Secondary | ICD-10-CM | POA: Diagnosis not present

## 2018-01-21 DIAGNOSIS — D649 Anemia, unspecified: Secondary | ICD-10-CM | POA: Diagnosis not present

## 2018-01-21 DIAGNOSIS — D696 Thrombocytopenia, unspecified: Secondary | ICD-10-CM

## 2018-01-21 DIAGNOSIS — C9000 Multiple myeloma not having achieved remission: Secondary | ICD-10-CM | POA: Diagnosis not present

## 2018-01-21 DIAGNOSIS — Z7984 Long term (current) use of oral hypoglycemic drugs: Secondary | ICD-10-CM | POA: Diagnosis not present

## 2018-01-21 DIAGNOSIS — Z905 Acquired absence of kidney: Secondary | ICD-10-CM | POA: Diagnosis not present

## 2018-01-21 DIAGNOSIS — D472 Monoclonal gammopathy: Secondary | ICD-10-CM | POA: Diagnosis not present

## 2018-01-21 LAB — COMPREHENSIVE METABOLIC PANEL
ALT: 20 U/L (ref 17–63)
ANION GAP: 11 (ref 5–15)
AST: 16 U/L (ref 15–41)
Albumin: 4 g/dL (ref 3.5–5.0)
Alkaline Phosphatase: 171 U/L — ABNORMAL HIGH (ref 38–126)
BILIRUBIN TOTAL: 0.8 mg/dL (ref 0.3–1.2)
BUN: 29 mg/dL — AB (ref 6–20)
CO2: 23 mmol/L (ref 22–32)
Calcium: 9.2 mg/dL (ref 8.9–10.3)
Chloride: 105 mmol/L (ref 101–111)
Creatinine, Ser: 2.72 mg/dL — ABNORMAL HIGH (ref 0.61–1.24)
GFR, EST AFRICAN AMERICAN: 26 mL/min — AB (ref >60)
GFR, EST NON AFRICAN AMERICAN: 23 mL/min — AB (ref >60)
Glucose, Bld: 193 mg/dL — ABNORMAL HIGH (ref 65–99)
POTASSIUM: 4.3 mmol/L (ref 3.5–5.1)
Sodium: 139 mmol/L (ref 135–145)
TOTAL PROTEIN: 6.3 g/dL — AB (ref 6.5–8.1)

## 2018-01-21 LAB — CBC WITH DIFFERENTIAL/PLATELET
Basophils Absolute: 0.1 10*3/uL (ref 0.0–0.1)
Basophils Relative: 1 %
EOS PCT: 1 %
Eosinophils Absolute: 0.1 10*3/uL (ref 0.0–0.7)
HCT: 25.4 % — ABNORMAL LOW (ref 39.0–52.0)
Hemoglobin: 8.5 g/dL — ABNORMAL LOW (ref 13.0–17.0)
LYMPHS PCT: 24 %
Lymphs Abs: 1.3 10*3/uL (ref 0.7–4.0)
MCH: 34.1 pg — AB (ref 26.0–34.0)
MCHC: 33.5 g/dL (ref 30.0–36.0)
MCV: 102 fL — AB (ref 78.0–100.0)
MONO ABS: 0.5 10*3/uL (ref 0.1–1.0)
MONOS PCT: 9 %
Neutro Abs: 3.6 10*3/uL (ref 1.7–7.7)
Neutrophils Relative %: 65 %
Platelets: 110 10*3/uL — ABNORMAL LOW (ref 150–400)
RBC: 2.49 MIL/uL — ABNORMAL LOW (ref 4.22–5.81)
RDW: 17.4 % — AB (ref 11.5–15.5)
WBC: 5.6 10*3/uL (ref 4.0–10.5)

## 2018-01-21 LAB — SAMPLE TO BLOOD BANK

## 2018-01-21 MED ORDER — BORTEZOMIB CHEMO SQ INJECTION 3.5 MG (2.5MG/ML)
1.3000 mg/m2 | Freq: Once | INTRAMUSCULAR | Status: AC
  Administered 2018-01-21: 2.75 mg via SUBCUTANEOUS
  Filled 2018-01-21: qty 2.75

## 2018-01-21 MED ORDER — PROCHLORPERAZINE MALEATE 10 MG PO TABS
ORAL_TABLET | ORAL | Status: AC
  Filled 2018-01-21: qty 1

## 2018-01-21 MED ORDER — PROCHLORPERAZINE MALEATE 10 MG PO TABS
10.0000 mg | ORAL_TABLET | Freq: Once | ORAL | Status: AC
  Administered 2018-01-21: 10 mg via ORAL

## 2018-01-21 NOTE — Patient Instructions (Addendum)
[place] at [facility] Discharge Instructions   You were seen today by Dr. Zoila Shutter. She went over your recent lab results and some of your numbers are looking better.  Continue your Velcade and resume your Revlimid. Please make sure that you are pushing your fluids.  We will repeat your blood work in 1 week to recheck your numbers. We will start weekly labs as well to keep an eye on your levels. We will see you back in 4 weeks for labs and follow up.   Thank you for choosing [place] at [facility] to provide your oncology and hematology care.  To afford each patient quality time with our provider, please arrive at least 15 minutes before your scheduled appointment time.    If you have a lab appointment with the [place] please come in thru the  Main Entrance and check in at the main information desk  You need to re-schedule your appointment should you arrive 10 or more minutes late.  We strive to give you quality time with our providers, and arriving late affects you and other patients whose appointments are after yours.  Also, if you no show three or more times for appointments you may be dismissed from the clinic at the providers discretion.     Again, thank you for choosing [facility].  Our hope is that these requests will decrease the amount of time that you wait before being seen by our physicians.       _____________________________________________________________  Should you have questions after your visit to [facility], please contact our office at [phone] between the hours of 8:30 a.m. and 4:30 p.m.  Voicemails left after 4:30 p.m. will not be returned until the following business day.  For prescription refill requests, have your pharmacy contact our office.       Resources For Cancer Patients and their Caregivers ? American Cancer Society: Can assist with transportation, wigs,  general needs, runs Look Good Feel Better.        ([phone] [phone] ? Cancer Care: Provides financial assistance, online support groups, medication/co-pay assistance.  1-800-813-HOPE 4800277446) ? [place] Assists [place] cancer patients and their families through emotional , educational and financial support.  480-742-2660 ? [place] DSS Where to apply for food stamps, Medicaid and utility assistance. 214 681 0502 ? RCATS: Transportation to medical appointments. (502)696-2368 ? Social Security Administration: May apply for disability if have a Stage IV cancer. 5410489572 [phone] ? [employer], Disability and Transit Services: Assists with nutrition, care and transit needs. [place] Support Programs:   > Cancer Support Group  2nd Tuesday of the month 1pm-2pm, Journey Room   > Creative Journey  3rd Tuesday of the month 1130am-1pm, Journey Room

## 2018-01-21 NOTE — Progress Notes (Signed)
Per Dr Walden Field , treat patient with Velcade every 21 days. Blood work next week per visit summary.

## 2018-01-21 NOTE — Progress Notes (Signed)
Diagnosis No diagnosis found.  Staging Cancer Staging No matching staging information was found for the patient.  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  He is being treated with Revlimid, Velcade and low-dose Decadron under the direction of Dr. Lebron Conners.  Labs done today show WBC 5.6 HB 8.5 plts 110,000.  Cr is improved at 2.72.  Recent SPEP 10/2017 shows  no monoclonal spike.  I have discussed with him that due to the changes in renal function, he will have to have repeat labs next week.  He will restart Revlimid and will proceed with Velcade.   Previously, Revlimid dose was decreased to 10 mg after visit with [facility] transplant team.  He is advised to notify the office if he has any significant issues prior to his next visit.  2.  Anemia.  Hemoglobin is stable at 8.5.  He will have repeat labs next week.    3.  Thrombocytopenia.  His platelet count is 110,000.  Labs will be repeated next week.    4.  Renal insufficiency.  His creatinine is slightly improved at 2.72.  Revlimid dosing decreased to 10 mg.  Will repeat labs next week.    Interval history: 67 y.o. male with diagnosis of active multiple myeloma IgA kappa.  He is being treated with Revlimid, bortezomib, and low-dose dexamethasone under the direction of Dr. Lebron Conners.  Previous cycle of therapy complicated by grade 3 anemia necessitating holding of bortezomib per Dr. Lebron Conners.    Current status: The patient is seen today for follow-up and evaluation prior to cycle 3-day 1 of Velcade Decadron and Revlimid.  He reports he will be seen soon at [facility] for stem cell collection.      Multiple myeloma ([place])   08/29/2017 Initial Diagnosis    Multiple myeloma ([place])      12/02/2017 -  Chemotherapy    The patient had bortezomib SQ (VELCADE) chemo injection 2.75 mg, 1.3 mg/m2 = 2.75 mg, Subcutaneous,  Once, 3 of 4 cycles Administration: 2.75 mg (12/10/2017), 2.75 mg (12/17/2017), 2.75 mg (12/31/2017), 2.75 mg (01/21/2018)  for chemotherapy treatment.          Problem List Patient Active Problem List   Diagnosis Date Noted  . Hematochezia [K92.1] 12/18/2017  . Multiple myeloma ([place]) [C90.00] 08/29/2017  . CKD (chronic kidney disease) [N18.9] 08/29/2017  . Symptomatic anemia [D64.9]   . 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 AKI (acute kidney injury) ([place]) [N17.9] 07/24/2017  . Closed fracture of transverse process of lumbar vertebra, with delayed healing, subsequent encounter [S32.009G] 07/24/2017  . Renal failure [N19] 07/23/2017  . Hypercalcemia [E83.52] 07/23/2017  . Anemia [D64.9] 07/23/2017  . Thrombocytopenia ([place]) [D69.6] 07/23/2017  . Diabetes mellitus type 2 in obese ([place]) [E11.69, E66.9] 07/23/2017    Past Medical History Past Medical History:  Diagnosis Date  . Cancer ([place])    multiple myeloma  . Cancer of right kidney ([place])   . Cervical dystonia   . Diabetes mellitus without complication [facility])     Past Surgical History Past Surgical History:  Procedure Laterality Date  . CHOLECYSTECTOMY  2007  . NEPHRECTOMY Right 1998   cancer    Family History Family History  Problem Relation Age of Onset  . Heart failure Mother 52  . Dementia Father   . Colon cancer Neg Hx      Social History  reports that he has never smoked. He quit smokeless tobacco use about 7 months ago. His smokeless tobacco use included chew.  He reports that he does not drink alcohol or use drugs.  Medications  Current Outpatient Medications:  .  acyclovir (ZOVIRAX) 400 MG tablet, Take 1 tablet (400 mg total) by mouth 2 (two) times daily., Disp: 60 tablet, Rfl: 3 .  allopurinol (ZYLOPRIM) 300 MG tablet, Take 300 mg by mouth every morning., Disp: , Rfl:  .  aspirin EC 81 MG tablet, Take 81 mg daily by mouth., Disp: , Rfl:  .  cyclobenzaprine (FLEXERIL) 10 MG tablet, Take 10 mg by mouth at bedtime., Disp: , Rfl:  .  dexamethasone (DECADRON) 4 MG tablet, Take 10 tablets (40 mg) on days 1 and 8 of chemo (velcade). Repeat every 21 days.,  Disp: 30 tablet, Rfl: 3 .  furosemide (LASIX) 40 MG tablet, Take 40 mg by mouth every Monday, Wednesday, and Friday. , Disp: , Rfl:  .  glipiZIDE (GLIPIZIDE XL) 2.5 MG 24 hr tablet, Take 1 tablet (2.5 mg total) by mouth daily with breakfast., Disp: 30 tablet, Rfl: 0 .  lenalidomide (REVLIMID) 10 MG capsule, Take 1 capsule (10 mg) days 1-14. Then 7 days off.  Every 21 days. (Patient not taking: Reported on 01/21/2018), Disp: 14 capsule, Rfl: 0 .  Multiple Vitamins-Minerals (CENTRUM SILVER 50+MEN) TABS, Take 1 tablet by mouth every morning., Disp: , Rfl:  .  pravastatin (PRAVACHOL) 20 MG tablet, Take 20 mg by mouth at bedtime. , Disp: , Rfl:  .  sodium bicarbonate 650 MG tablet, Take 1 tablet (650 mg total) by mouth 2 (two) times daily. (Patient taking differently: Take 650 mg by mouth 3 (three) times daily. ), Disp: 60 tablet, Rfl: 1 .  traMADol (ULTRAM) 50 MG tablet, Take 1 tablet (50 mg total) every 6 (six) hours as needed by mouth. (Patient not taking: Reported on 01/02/2018), Disp: 30 tablet, Rfl: 0 No current facility-administered medications for this visit.   Facility-Administered Medications Ordered in Other Visits:  .  0.9 %  sodium chloride infusion, 250 mL, Intravenous, Once, Twana First, MD  Allergies Patient has no known allergies.  Review of Systems Review of Systems - Oncology ROS as per HPI otherwise 12 point ROS is negative.   Physical Exam  Vitals Wt Readings from Last 3 Encounters:  01/21/18 211 lb 14.4 oz (96.1 kg)  01/10/18 211 lb (95.7 kg)  01/07/18 211 lb 9.6 oz (96 kg)   Temp Readings from Last 3 Encounters:  01/21/18 98.1 F (36.7 C) (Oral)  01/16/18 98.2 F (36.8 C)  01/10/18 99 F (37.2 C) (Oral)   BP Readings from Last 3 Encounters:  01/21/18 (!) 148/71  01/16/18 (!) 153/93  01/07/18 (!) 139/54   Pulse Readings from Last 3 Encounters:  01/21/18 80  01/16/18 64  01/10/18 88   Constitutional: Well-developed, well-nourished, and in no distress.    HENT: Head: Normocephalic and atraumatic.  Mouth/Throat: No oropharyngeal exudate. Mucosa moist. Eyes: Pupils are equal, round, and reactive to light. Conjunctivae are normal. No scleral icterus.  Neck: Normal range of motion. Neck supple. No JVD present.  Cardiovascular: Normal rate, regular rhythm and normal heart sounds.  Exam reveals no gallop and no friction rub.   No murmur heard. Pulmonary/Chest: Effort normal and breath sounds normal. No respiratory distress. No wheezes.No rales.  Abdominal: Soft. Bowel sounds are normal. No distension. There is no tenderness. There is no guarding.  Musculoskeletal: No edema or tenderness.  Lymphadenopathy: No cervical, axillary or supraclavicular adenopathy.  Neurological: Alert and oriented to person, place, and time. No cranial nerve  deficit.  Skin: Skin is warm and dry. No rash noted. No erythema. No pallor.  Psychiatric: Affect and judgment normal.   Labs Appointment on 01/21/2018  Component Date Value Ref Range Status  . Blood Bank Specimen 01/21/2018 SAMPLE AVAILABLE FOR TESTING   Final  . Sample Expiration 01/21/2018    Final                   Value:01/24/2018 Performed at [facility], [address], [place], [place] [address]   . WBC 01/21/2018 5.6  4.0 - 10.5 K/uL Final  . RBC 01/21/2018 2.49* 4.22 - 5.81 MIL/uL Final  . Hemoglobin 01/21/2018 8.5* 13.0 - 17.0 g/dL Final  . HCT 01/21/2018 25.4* 39.0 - 52.0 % Final  . MCV 01/21/2018 102.0* 78.0 - 100.0 fL Final  . MCH 01/21/2018 34.1* 26.0 - 34.0 pg Final  . MCHC 01/21/2018 33.5  30.0 - 36.0 g/dL Final  . RDW 01/21/2018 17.4* 11.5 - 15.5 % Final  . Platelets 01/21/2018 110* 150 - 400 K/uL Final   Comment: CONSISTENT WITH PREVIOUS RESULT SPECIMEN CHECKED FOR CLOTS   . Neutrophils Relative % 01/21/2018 65  % Final  . Neutro Abs 01/21/2018 3.6  1.7 - 7.7 K/uL Final  . Lymphocytes Relative 01/21/2018 24  % Final  . Lymphs Abs 01/21/2018 1.3  0.7 - 4.0 K/uL Final  . Monocytes  Relative 01/21/2018 9  % Final  . Monocytes Absolute 01/21/2018 0.5  0.1 - 1.0 K/uL Final  . Eosinophils Relative 01/21/2018 1  % Final  . Eosinophils Absolute 01/21/2018 0.1  0.0 - 0.7 K/uL Final  . Basophils Relative 01/21/2018 1  % Final  . Basophils Absolute 01/21/2018 0.1  0.0 - 0.1 K/uL Final   Performed at [facility], [address], [place], [place] [address]  . Sodium 01/21/2018 139  135 - 145 mmol/L Final  . Potassium 01/21/2018 4.3  3.5 - 5.1 mmol/L Final  . Chloride 01/21/2018 105  101 - 111 mmol/L Final  . CO2 01/21/2018 23  22 - 32 mmol/L Final  . Glucose, Bld 01/21/2018 193* 65 - 99 mg/dL Final  . BUN 01/21/2018 29* 6 - 20 mg/dL Final  . Creatinine, Ser 01/21/2018 2.72* 0.61 - 1.24 mg/dL Final  . Calcium 01/21/2018 9.2  8.9 - 10.3 mg/dL Final  . Total Protein 01/21/2018 6.3* 6.5 - 8.1 g/dL Final  . Albumin 01/21/2018 4.0  3.5 - 5.0 g/dL Final  . AST 01/21/2018 16  15 - 41 U/L Final  . ALT 01/21/2018 20  17 - 63 U/L Final  . Alkaline Phosphatase 01/21/2018 171* 38 - 126 U/L Final  . Total Bilirubin 01/21/2018 0.8  0.3 - 1.2 mg/dL Final  . GFR calc non Af Amer 01/21/2018 23* >60 mL/min Final  . GFR calc Af Amer 01/21/2018 26* >60 mL/min Final   Comment: (NOTE) The eGFR has been calculated using the CKD EPI equation. This calculation has not been validated in all clinical situations. eGFR's persistently <60 mL/min signify possible Chronic Kidney Disease.   Georgiann Hahn gap 01/21/2018 11  5 - 15 Final   Performed at [facility], [address], [place], [place] [address]     Pathology No orders of the defined types were placed in this encounter.      Zoila Shutter MD

## 2018-01-21 NOTE — Progress Notes (Signed)
Tom Marshall tolerated Velcade injection well without complaints or incident. Labs reviewed with and pt seen by Dr. Walden Field prior to administering this injection.Pt continues to take his Revlimid as prescribed without any issues Pt discharged self ambulatory in satisfactory condition

## 2018-01-21 NOTE — Patient Instructions (Signed)
[facility] Discharge Instructions for Patients Receiving Chemotherapy   Beginning January 23rd 2017 lab work for the Cancer Center will be done in the  Main lab at [place] on 1st floor. If you have a lab appointment with the Cancer Center please come in thru the  Main Entrance and check in at the main information desk   Today you received the following chemotherapy agents Velcade injection. Follow-up as scheduled. Call clinic for any questions or concerns  To help prevent nausea and vomiting after your treatment, we encourage you to take your nausea medication   If you develop nausea and vomiting, or diarrhea that is not controlled by your medication, call the clinic.  The clinic phone number is [phone]. Office hours are Monday-Friday 8:30am-5:00pm.  BELOW ARE SYMPTOMS THAT SHOULD BE REPORTED IMMEDIATELY:  *FEVER GREATER THAN 101.0 F  *CHILLS WITH OR WITHOUT FEVER  NAUSEA AND VOMITING THAT IS NOT CONTROLLED WITH YOUR NAUSEA MEDICATION  *UNUSUAL SHORTNESS OF BREATH  *UNUSUAL BRUISING OR BLEEDING  TENDERNESS IN MOUTH AND THROAT WITH OR WITHOUT PRESENCE OF ULCERS  *URINARY PROBLEMS  *BOWEL PROBLEMS  UNUSUAL RASH Items with * indicate a potential emergency and should be followed up as soon as possible. If you have an emergency after office hours please contact your primary care physician or go to the nearest emergency department.  Please call the clinic during office hours if you have any questions or concerns.   You may also contact the Patient Navigator at [phone] should you have any questions or need assistance in obtaining follow up care.      Resources For Cancer Patients and their Caregivers ? American Cancer Society: Can assist with transportation, wigs, general needs, runs Look Good Feel Better.        1-888-227-6333 ? Cancer Care: Provides financial assistance, online support groups, medication/co-pay assistance.   1-800-813-HOPE (4673) ? [facility] Assists [place] cancer patients and their families through emotional , educational and financial support.  336-427-4357 ? [place] DSS Where to apply for food stamps, Medicaid and utility assistance. 336-342-1394 ? RCATS: Transportation to medical appointments. 336-347-2287 ? Social Security Administration: May apply for disability if have a Stage IV cancer. [phone] 1-800-772-1213 ? [place] Aging, Disability and Transit Services: Assists with nutrition, care and transit needs. [phone]         

## 2018-01-23 ENCOUNTER — Encounter (HOSPITAL_COMMUNITY): Payer: Self-pay | Admitting: Internal Medicine

## 2018-01-28 ENCOUNTER — Other Ambulatory Visit (HOSPITAL_COMMUNITY): Payer: Medicare Other

## 2018-01-28 ENCOUNTER — Inpatient Hospital Stay (HOSPITAL_COMMUNITY): Payer: Medicare Other

## 2018-01-28 ENCOUNTER — Inpatient Hospital Stay (HOSPITAL_COMMUNITY): Payer: Medicare Other | Attending: Internal Medicine

## 2018-01-28 ENCOUNTER — Ambulatory Visit (HOSPITAL_COMMUNITY): Payer: Medicare Other | Admitting: Hematology

## 2018-01-28 DIAGNOSIS — C9000 Multiple myeloma not having achieved remission: Secondary | ICD-10-CM | POA: Diagnosis not present

## 2018-01-28 DIAGNOSIS — Z79899 Other long term (current) drug therapy: Secondary | ICD-10-CM | POA: Insufficient documentation

## 2018-01-28 DIAGNOSIS — N189 Chronic kidney disease, unspecified: Secondary | ICD-10-CM | POA: Insufficient documentation

## 2018-01-28 DIAGNOSIS — Z905 Acquired absence of kidney: Secondary | ICD-10-CM | POA: Insufficient documentation

## 2018-01-28 DIAGNOSIS — E669 Obesity, unspecified: Secondary | ICD-10-CM | POA: Diagnosis not present

## 2018-01-28 DIAGNOSIS — D472 Monoclonal gammopathy: Secondary | ICD-10-CM | POA: Diagnosis not present

## 2018-01-28 DIAGNOSIS — Z7984 Long term (current) use of oral hypoglycemic drugs: Secondary | ICD-10-CM | POA: Insufficient documentation

## 2018-01-28 DIAGNOSIS — D696 Thrombocytopenia, unspecified: Secondary | ICD-10-CM

## 2018-01-28 DIAGNOSIS — Z7982 Long term (current) use of aspirin: Secondary | ICD-10-CM | POA: Diagnosis not present

## 2018-01-28 DIAGNOSIS — D649 Anemia, unspecified: Secondary | ICD-10-CM | POA: Diagnosis not present

## 2018-01-28 DIAGNOSIS — Z85528 Personal history of other malignant neoplasm of kidney: Secondary | ICD-10-CM | POA: Diagnosis not present

## 2018-01-28 DIAGNOSIS — E1122 Type 2 diabetes mellitus with diabetic chronic kidney disease: Secondary | ICD-10-CM | POA: Diagnosis not present

## 2018-01-28 DIAGNOSIS — Z5111 Encounter for antineoplastic chemotherapy: Secondary | ICD-10-CM | POA: Insufficient documentation

## 2018-01-28 LAB — COMPREHENSIVE METABOLIC PANEL
ALBUMIN: 4 g/dL (ref 3.5–5.0)
ALK PHOS: 151 U/L — AB (ref 38–126)
ALT: 23 U/L (ref 17–63)
ANION GAP: 12 (ref 5–15)
AST: 13 U/L — AB (ref 15–41)
BILIRUBIN TOTAL: 0.9 mg/dL (ref 0.3–1.2)
BUN: 44 mg/dL — AB (ref 6–20)
CO2: 24 mmol/L (ref 22–32)
Calcium: 9.3 mg/dL (ref 8.9–10.3)
Chloride: 101 mmol/L (ref 101–111)
Creatinine, Ser: 2.7 mg/dL — ABNORMAL HIGH (ref 0.61–1.24)
GFR calc Af Amer: 26 mL/min — ABNORMAL LOW (ref >60)
GFR calc non Af Amer: 23 mL/min — ABNORMAL LOW (ref >60)
GLUCOSE: 187 mg/dL — AB (ref 65–99)
POTASSIUM: 4.1 mmol/L (ref 3.5–5.1)
SODIUM: 137 mmol/L (ref 135–145)
Total Protein: 6.4 g/dL — ABNORMAL LOW (ref 6.5–8.1)

## 2018-01-28 LAB — CBC WITH DIFFERENTIAL/PLATELET
BASOS ABS: 0 10*3/uL (ref 0.0–0.1)
BASOS PCT: 0 %
EOS ABS: 0.3 10*3/uL (ref 0.0–0.7)
Eosinophils Relative: 5 %
HEMATOCRIT: 27.8 % — AB (ref 39.0–52.0)
HEMOGLOBIN: 9.3 g/dL — AB (ref 13.0–17.0)
Lymphocytes Relative: 24 %
Lymphs Abs: 1.5 10*3/uL (ref 0.7–4.0)
MCH: 34.4 pg — ABNORMAL HIGH (ref 26.0–34.0)
MCHC: 33.5 g/dL (ref 30.0–36.0)
MCV: 103 fL — ABNORMAL HIGH (ref 78.0–100.0)
MONOS PCT: 5 %
Monocytes Absolute: 0.3 10*3/uL (ref 0.1–1.0)
NEUTROS ABS: 4.1 10*3/uL (ref 1.7–7.7)
NEUTROS PCT: 66 %
Platelets: 105 10*3/uL — ABNORMAL LOW (ref 150–400)
RBC: 2.7 MIL/uL — ABNORMAL LOW (ref 4.22–5.81)
RDW: 16.9 % — AB (ref 11.5–15.5)
WBC: 6.3 10*3/uL (ref 4.0–10.5)

## 2018-01-28 LAB — SAMPLE TO BLOOD BANK

## 2018-01-29 ENCOUNTER — Telehealth (HOSPITAL_COMMUNITY): Payer: Self-pay

## 2018-01-29 DIAGNOSIS — G629 Polyneuropathy, unspecified: Secondary | ICD-10-CM

## 2018-01-29 MED ORDER — GABAPENTIN 300 MG PO CAPS: 300.0000 mg | ORAL_CAPSULE | Freq: Every day | ORAL | 0 refills | Status: DC

## 2018-01-29 NOTE — Telephone Encounter (Signed)
Patient came by cancer center yesterday stating "[facility]" wanted his local oncologist to order him gabapentin for his neuropathy symptoms called [facility] to verify this and spoke with Kathryne Eriksson, RN, one of the transplant coordinators. She states she was just giving the patient recommendations and did not say for his local oncologist to order. She states she told patient to talk to his local oncologist at his next appt about his neuropathy symptoms. Reviewed with Dr. Walden Field. She ordered gabapentin 300 mg daily at [facility] for patient to try. Prescription sent to his pharmacy. Patient notified.

## 2018-01-31 ENCOUNTER — Other Ambulatory Visit: Payer: Self-pay

## 2018-01-31 DIAGNOSIS — D649 Anemia, unspecified: Secondary | ICD-10-CM

## 2018-02-04 ENCOUNTER — Inpatient Hospital Stay (HOSPITAL_COMMUNITY): Payer: Medicare Other

## 2018-02-04 ENCOUNTER — Other Ambulatory Visit (HOSPITAL_COMMUNITY): Payer: Medicare Other

## 2018-02-04 ENCOUNTER — Telehealth (HOSPITAL_COMMUNITY): Payer: Self-pay | Admitting: *Deleted

## 2018-02-04 DIAGNOSIS — D649 Anemia, unspecified: Secondary | ICD-10-CM | POA: Diagnosis not present

## 2018-02-04 DIAGNOSIS — Z5111 Encounter for antineoplastic chemotherapy: Secondary | ICD-10-CM | POA: Diagnosis not present

## 2018-02-04 DIAGNOSIS — C9000 Multiple myeloma not having achieved remission: Secondary | ICD-10-CM | POA: Diagnosis not present

## 2018-02-04 DIAGNOSIS — Z85528 Personal history of other malignant neoplasm of kidney: Secondary | ICD-10-CM | POA: Diagnosis not present

## 2018-02-04 DIAGNOSIS — N189 Chronic kidney disease, unspecified: Secondary | ICD-10-CM | POA: Diagnosis not present

## 2018-02-04 DIAGNOSIS — D696 Thrombocytopenia, unspecified: Secondary | ICD-10-CM

## 2018-02-04 LAB — CBC WITH DIFFERENTIAL/PLATELET
BASOS PCT: 0 %
Basophils Absolute: 0 10*3/uL (ref 0.0–0.1)
Eosinophils Absolute: 0.4 10*3/uL (ref 0.0–0.7)
Eosinophils Relative: 5 %
HEMATOCRIT: 26.9 % — AB (ref 39.0–52.0)
HEMOGLOBIN: 9 g/dL — AB (ref 13.0–17.0)
Lymphocytes Relative: 17 %
Lymphs Abs: 1.2 10*3/uL (ref 0.7–4.0)
MCH: 34.4 pg — AB (ref 26.0–34.0)
MCHC: 33.5 g/dL (ref 30.0–36.0)
MCV: 102.7 fL — ABNORMAL HIGH (ref 78.0–100.0)
MONO ABS: 0.7 10*3/uL (ref 0.1–1.0)
MONOS PCT: 10 %
NEUTROS ABS: 4.7 10*3/uL (ref 1.7–7.7)
NEUTROS PCT: 68 %
Platelets: 58 10*3/uL — ABNORMAL LOW (ref 150–400)
RBC: 2.62 MIL/uL — ABNORMAL LOW (ref 4.22–5.81)
RDW: 15.9 % — AB (ref 11.5–15.5)
WBC: 6.9 10*3/uL (ref 4.0–10.5)

## 2018-02-04 LAB — COMPREHENSIVE METABOLIC PANEL
ALBUMIN: 3.8 g/dL (ref 3.5–5.0)
ALK PHOS: 138 U/L — AB (ref 38–126)
ALT: 34 U/L (ref 17–63)
ANION GAP: 14 (ref 5–15)
AST: 20 U/L (ref 15–41)
BILIRUBIN TOTAL: 0.8 mg/dL (ref 0.3–1.2)
BUN: 43 mg/dL — ABNORMAL HIGH (ref 6–20)
CALCIUM: 8.6 mg/dL — AB (ref 8.9–10.3)
CO2: 24 mmol/L (ref 22–32)
Chloride: 99 mmol/L — ABNORMAL LOW (ref 101–111)
Creatinine, Ser: 2.68 mg/dL — ABNORMAL HIGH (ref 0.61–1.24)
GFR, EST AFRICAN AMERICAN: 27 mL/min — AB (ref >60)
GFR, EST NON AFRICAN AMERICAN: 23 mL/min — AB (ref >60)
Glucose, Bld: 197 mg/dL — ABNORMAL HIGH (ref 65–99)
POTASSIUM: 4.1 mmol/L (ref 3.5–5.1)
Sodium: 137 mmol/L (ref 135–145)
TOTAL PROTEIN: 6.1 g/dL — AB (ref 6.5–8.1)

## 2018-02-04 LAB — SAMPLE TO BLOOD BANK

## 2018-02-05 ENCOUNTER — Other Ambulatory Visit (HOSPITAL_COMMUNITY): Payer: Self-pay | Admitting: Emergency Medicine

## 2018-02-05 ENCOUNTER — Telehealth (HOSPITAL_COMMUNITY): Payer: Self-pay | Admitting: Emergency Medicine

## 2018-02-05 DIAGNOSIS — C9000 Multiple myeloma not having achieved remission: Secondary | ICD-10-CM

## 2018-02-05 MED ORDER — LENALIDOMIDE 10 MG PO CAPS
ORAL_CAPSULE | ORAL | 0 refills | Status: DC
Start: 2018-02-05 — End: 2018-06-03

## 2018-02-05 NOTE — Telephone Encounter (Signed)
Called pt to let him know that I refilled his revlimid.  This is his week off.  He will have lab work next Tuesday 4/16.  Pt told not to re-start his revlimid  until he has heard back from the clinic about his lab work and we let him know when/if he could re-start his revlimid.  He verbalized understanding.

## 2018-02-11 ENCOUNTER — Other Ambulatory Visit (HOSPITAL_COMMUNITY): Payer: Medicare Other

## 2018-02-11 ENCOUNTER — Inpatient Hospital Stay (HOSPITAL_COMMUNITY): Payer: Medicare Other

## 2018-02-11 DIAGNOSIS — C9000 Multiple myeloma not having achieved remission: Secondary | ICD-10-CM

## 2018-02-11 DIAGNOSIS — Z85528 Personal history of other malignant neoplasm of kidney: Secondary | ICD-10-CM | POA: Diagnosis not present

## 2018-02-11 DIAGNOSIS — D649 Anemia, unspecified: Secondary | ICD-10-CM | POA: Diagnosis not present

## 2018-02-11 DIAGNOSIS — N189 Chronic kidney disease, unspecified: Secondary | ICD-10-CM | POA: Diagnosis not present

## 2018-02-11 DIAGNOSIS — Z5111 Encounter for antineoplastic chemotherapy: Secondary | ICD-10-CM | POA: Diagnosis not present

## 2018-02-11 DIAGNOSIS — D696 Thrombocytopenia, unspecified: Secondary | ICD-10-CM | POA: Diagnosis not present

## 2018-02-11 LAB — COMPREHENSIVE METABOLIC PANEL
ALK PHOS: 155 U/L — AB (ref 38–126)
ALT: 25 U/L (ref 17–63)
ANION GAP: 12 (ref 5–15)
AST: 15 U/L (ref 15–41)
Albumin: 4 g/dL (ref 3.5–5.0)
BILIRUBIN TOTAL: 0.7 mg/dL (ref 0.3–1.2)
BUN: 42 mg/dL — AB (ref 6–20)
CALCIUM: 8.9 mg/dL (ref 8.9–10.3)
CO2: 26 mmol/L (ref 22–32)
Chloride: 101 mmol/L (ref 101–111)
Creatinine, Ser: 2.63 mg/dL — ABNORMAL HIGH (ref 0.61–1.24)
GFR calc Af Amer: 27 mL/min — ABNORMAL LOW (ref >60)
GFR, EST NON AFRICAN AMERICAN: 24 mL/min — AB (ref >60)
Glucose, Bld: 225 mg/dL — ABNORMAL HIGH (ref 65–99)
POTASSIUM: 4.5 mmol/L (ref 3.5–5.1)
Sodium: 139 mmol/L (ref 135–145)
Total Protein: 6.5 g/dL (ref 6.5–8.1)

## 2018-02-11 LAB — CBC WITH DIFFERENTIAL/PLATELET
Basophils Absolute: 0 10*3/uL (ref 0.0–0.1)
Basophils Relative: 1 %
Eosinophils Absolute: 0.1 10*3/uL (ref 0.0–0.7)
Eosinophils Relative: 3 %
HEMATOCRIT: 26.6 % — AB (ref 39.0–52.0)
Hemoglobin: 8.9 g/dL — ABNORMAL LOW (ref 13.0–17.0)
LYMPHS PCT: 27 %
Lymphs Abs: 1.3 10*3/uL (ref 0.7–4.0)
MCH: 34.1 pg — ABNORMAL HIGH (ref 26.0–34.0)
MCHC: 33.5 g/dL (ref 30.0–36.0)
MCV: 101.9 fL — AB (ref 78.0–100.0)
MONO ABS: 0.4 10*3/uL (ref 0.1–1.0)
MONOS PCT: 9 %
NEUTROS ABS: 2.9 10*3/uL (ref 1.7–7.7)
Neutrophils Relative %: 60 %
Platelets: 76 10*3/uL — ABNORMAL LOW (ref 150–400)
RBC: 2.61 MIL/uL — ABNORMAL LOW (ref 4.22–5.81)
RDW: 15.3 % (ref 11.5–15.5)
WBC: 4.8 10*3/uL (ref 4.0–10.5)

## 2018-02-11 LAB — SAMPLE TO BLOOD BANK

## 2018-02-11 NOTE — Telephone Encounter (Signed)
Reviewed lab work with Dr. Delton Coombes.He ordered for patient to restart his Revlimid. Called and let patient know that Dr. Raliegh Ip said to go  ahead and restart his Revlimid. Patient verbalized understanding.

## 2018-02-12 ENCOUNTER — Other Ambulatory Visit (HOSPITAL_COMMUNITY): Payer: Self-pay | Admitting: Emergency Medicine

## 2018-02-12 DIAGNOSIS — C9000 Multiple myeloma not having achieved remission: Secondary | ICD-10-CM

## 2018-02-12 NOTE — Progress Notes (Signed)
bmbx ordered placed for [facility]

## 2018-02-18 ENCOUNTER — Encounter (HOSPITAL_COMMUNITY): Payer: Self-pay | Admitting: Internal Medicine

## 2018-02-18 ENCOUNTER — Inpatient Hospital Stay (HOSPITAL_COMMUNITY): Payer: Medicare Other

## 2018-02-18 ENCOUNTER — Ambulatory Visit (HOSPITAL_COMMUNITY): Payer: Medicare Other | Admitting: Internal Medicine

## 2018-02-18 ENCOUNTER — Telehealth: Payer: Self-pay

## 2018-02-18 ENCOUNTER — Other Ambulatory Visit (HOSPITAL_COMMUNITY): Payer: Medicare Other

## 2018-02-18 ENCOUNTER — Inpatient Hospital Stay (HOSPITAL_COMMUNITY): Payer: Medicare Other | Attending: Internal Medicine | Admitting: Internal Medicine

## 2018-02-18 VITALS — BP 159/71 | HR 97 | Temp 98.1°F | Resp 18 | Wt 222.6 lb

## 2018-02-18 DIAGNOSIS — Z905 Acquired absence of kidney: Secondary | ICD-10-CM | POA: Diagnosis not present

## 2018-02-18 DIAGNOSIS — Z85528 Personal history of other malignant neoplasm of kidney: Secondary | ICD-10-CM | POA: Diagnosis not present

## 2018-02-18 DIAGNOSIS — C9000 Multiple myeloma not having achieved remission: Secondary | ICD-10-CM

## 2018-02-18 DIAGNOSIS — D649 Anemia, unspecified: Secondary | ICD-10-CM

## 2018-02-18 DIAGNOSIS — N289 Disorder of kidney and ureter, unspecified: Secondary | ICD-10-CM | POA: Insufficient documentation

## 2018-02-18 DIAGNOSIS — Z7984 Long term (current) use of oral hypoglycemic drugs: Secondary | ICD-10-CM | POA: Insufficient documentation

## 2018-02-18 DIAGNOSIS — Z79899 Other long term (current) drug therapy: Secondary | ICD-10-CM | POA: Diagnosis not present

## 2018-02-18 DIAGNOSIS — Z7982 Long term (current) use of aspirin: Secondary | ICD-10-CM | POA: Insufficient documentation

## 2018-02-18 DIAGNOSIS — N189 Chronic kidney disease, unspecified: Secondary | ICD-10-CM | POA: Diagnosis not present

## 2018-02-18 DIAGNOSIS — D696 Thrombocytopenia, unspecified: Secondary | ICD-10-CM

## 2018-02-18 DIAGNOSIS — E119 Type 2 diabetes mellitus without complications: Secondary | ICD-10-CM | POA: Diagnosis not present

## 2018-02-18 DIAGNOSIS — Z5111 Encounter for antineoplastic chemotherapy: Secondary | ICD-10-CM | POA: Diagnosis not present

## 2018-02-18 LAB — CBC WITH DIFFERENTIAL/PLATELET
BASOS ABS: 0 10*3/uL (ref 0.0–0.1)
BASOS PCT: 0 %
Eosinophils Absolute: 0.1 10*3/uL (ref 0.0–0.7)
Eosinophils Relative: 2 %
HEMATOCRIT: 26.2 % — AB (ref 39.0–52.0)
Hemoglobin: 8.9 g/dL — ABNORMAL LOW (ref 13.0–17.0)
Lymphocytes Relative: 23 %
Lymphs Abs: 1.1 10*3/uL (ref 0.7–4.0)
MCH: 34.9 pg — ABNORMAL HIGH (ref 26.0–34.0)
MCHC: 34 g/dL (ref 30.0–36.0)
MCV: 102.7 fL — ABNORMAL HIGH (ref 78.0–100.0)
Monocytes Absolute: 0.3 10*3/uL (ref 0.1–1.0)
Monocytes Relative: 7 %
NEUTROS ABS: 3.4 10*3/uL (ref 1.7–7.7)
Neutrophils Relative %: 68 %
PLATELETS: 156 10*3/uL (ref 150–400)
RBC: 2.55 MIL/uL — ABNORMAL LOW (ref 4.22–5.81)
RDW: 16.2 % — AB (ref 11.5–15.5)
WBC: 4.9 10*3/uL (ref 4.0–10.5)

## 2018-02-18 LAB — COMPREHENSIVE METABOLIC PANEL
ALBUMIN: 3.8 g/dL (ref 3.5–5.0)
ALT: 39 U/L (ref 17–63)
AST: 20 U/L (ref 15–41)
Alkaline Phosphatase: 174 U/L — ABNORMAL HIGH (ref 38–126)
Anion gap: 14 (ref 5–15)
BUN: 43 mg/dL — ABNORMAL HIGH (ref 6–20)
CHLORIDE: 98 mmol/L — AB (ref 101–111)
CO2: 25 mmol/L (ref 22–32)
Calcium: 8.9 mg/dL (ref 8.9–10.3)
Creatinine, Ser: 2.73 mg/dL — ABNORMAL HIGH (ref 0.61–1.24)
GFR calc Af Amer: 26 mL/min — ABNORMAL LOW (ref >60)
GFR calc non Af Amer: 23 mL/min — ABNORMAL LOW (ref >60)
GLUCOSE: 201 mg/dL — AB (ref 65–99)
POTASSIUM: 4.3 mmol/L (ref 3.5–5.1)
Sodium: 137 mmol/L (ref 135–145)
Total Bilirubin: 0.6 mg/dL (ref 0.3–1.2)
Total Protein: 6.2 g/dL — ABNORMAL LOW (ref 6.5–8.1)

## 2018-02-18 LAB — SAMPLE TO BLOOD BANK

## 2018-02-18 MED ORDER — PROCHLORPERAZINE MALEATE 10 MG PO TABS
10.0000 mg | ORAL_TABLET | Freq: Once | ORAL | Status: AC
  Administered 2018-02-18: 10 mg via ORAL

## 2018-02-18 MED ORDER — BORTEZOMIB CHEMO SQ INJECTION 3.5 MG (2.5MG/ML)
1.3000 mg/m2 | Freq: Once | INTRAMUSCULAR | Status: AC
  Administered 2018-02-18: 2.75 mg via SUBCUTANEOUS
  Filled 2018-02-18: qty 2.75

## 2018-02-18 MED ORDER — PROCHLORPERAZINE MALEATE 10 MG PO TABS
ORAL_TABLET | ORAL | Status: AC
Start: 2018-02-18 — End: 2018-02-18
  Filled 2018-02-18: qty 1

## 2018-02-18 NOTE — Patient Instructions (Signed)
[facility] Discharge Instructions for Patients Receiving Chemotherapy   Beginning January 23rd 2017 lab work for the Cancer Center will be done in the  Main lab at [place] on 1st floor. If you have a lab appointment with the Cancer Center please come in thru the  Main Entrance and check in at the main information desk   Today you received the following chemotherapy agents Velcade injection. Follow-up as scheduled. Call clinic for any questions or concerns  To help prevent nausea and vomiting after your treatment, we encourage you to take your nausea medication   If you develop nausea and vomiting, or diarrhea that is not controlled by your medication, call the clinic.  The clinic phone number is [phone]. Office hours are Monday-Friday 8:30am-5:00pm.  BELOW ARE SYMPTOMS THAT SHOULD BE REPORTED IMMEDIATELY:  *FEVER GREATER THAN 101.0 F  *CHILLS WITH OR WITHOUT FEVER  NAUSEA AND VOMITING THAT IS NOT CONTROLLED WITH YOUR NAUSEA MEDICATION  *UNUSUAL SHORTNESS OF BREATH  *UNUSUAL BRUISING OR BLEEDING  TENDERNESS IN MOUTH AND THROAT WITH OR WITHOUT PRESENCE OF ULCERS  *URINARY PROBLEMS  *BOWEL PROBLEMS  UNUSUAL RASH Items with * indicate a potential emergency and should be followed up as soon as possible. If you have an emergency after office hours please contact your primary care physician or go to the nearest emergency department.  Please call the clinic during office hours if you have any questions or concerns.   You may also contact the Patient Navigator at [phone] should you have any questions or need assistance in obtaining follow up care.      Resources For Cancer Patients and their Caregivers ? American Cancer Society: Can assist with transportation, wigs, general needs, runs Look Good Feel Better.        1-888-227-6333 ? Cancer Care: Provides financial assistance, online support groups, medication/co-pay assistance.   1-800-813-HOPE (4673) ? [facility] Assists [place] cancer patients and their families through emotional , educational and financial support.  336-427-4357 ? [place] DSS Where to apply for food stamps, Medicaid and utility assistance. 336-342-1394 ? [facility]: Transportation to medical appointments. 336-347-2287 ? Social Security Administration: May apply for disability if have a Stage IV cancer. 336-342-7796 1-800-772-1213 ? [place] Aging, Disability and Transit Services: Assists with nutrition, care and transit needs. 336-349-2343         

## 2018-02-18 NOTE — Progress Notes (Signed)
[name] tolerated Velcade injection well without complaints or incident. Labs reviewed with and pt seen by Dr. Walden Field prior to administering this medication. VSS Pt discharged self ambulatory in satisfactory condition

## 2018-02-18 NOTE — Progress Notes (Signed)
Diagnosis Multiple myeloma not having achieved remission ([place]) - Plan: CBC with Differential/Platelet, Comprehensive metabolic panel, Lactate dehydrogenase, Protein electrophoresis, serum, Kappa/lambda light chains, IgG, IgA, IgM, Beta 2 microglobulin, serum  Staging Cancer Staging No matching staging information was found for the patient.  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  He is being treated with Revlimid, Velcade and low-dose Decadron under the direction of Dr. Lebron Conners.  Labs done today 02/18/2018 show WBC 4.9 HB 8.9 plts 156,000.  Cr stable at 2.73.   show WBC 5.6 HB 8.5 plts 110,000.  Cr is improved at 2.72.  SPEP  done  10/2017 shows  no monoclonal spike.  He is recommended for repeat bone marrow biopsy on Feb 27, 2018 at [facility].  He is scheduled to be seen  at [facility] on Mar 10, 2018 for follow-up.  He will be seen here for follow-up on Mar 13, 2018.  He will continue Revlimid as directed and will proceed with Velcade today.  Velcade will be held after today until the results of his bone marrow biopsy have been reviewed.  2.  Renal insufficiency.  The patient reports he had a history of right kidney cancer and had right nephrectomy performed.  Creatinine is 2.73 which is stable.  Revlimid dosing was decreased to 10 mg a day.  Will repeat chemistries on return to clinic.  3.  Anemia.  Hemoglobin is stable at 8.9.   Will repeat chemistries on return to clinic.  4.  Thrombocytopenia.  Platelet count is improved at 156,000.   Will repeat labs on RTC 02/2018.    5.  Right kidney cancer.  Pt underwent right nephrectomy in 1998.  Will continue to monitor renal function.     Interval history: 67 y.o. male with diagnosis of multiple myeloma IgA kappa.  He is being treated with Revlimid, bortezomib, and low-dose dexamethasone under the direction of Dr. Lebron Conners.  Previous cycle of therapy complicated by grade 3 anemia necessitating holding of bortezomib per Dr. Lebron Conners.    Current status: The  patient is seen today for follow-up and evaluation prior to Velcade.  He remains on Revlimid and Decadron and Revlimid.     Multiple myeloma ([place])   08/29/2017 Initial Diagnosis    Multiple myeloma ([place])      12/02/2017 -  Chemotherapy    The patient had bortezomib SQ (VELCADE) chemo injection 2.75 mg, 1.3 mg/m2 = 2.75 mg, Subcutaneous,  Once, 3 of 4 cycles Administration: 2.75 mg (12/10/2017), 2.75 mg (12/17/2017), 2.75 mg (12/31/2017), 2.75 mg (01/21/2018)  for chemotherapy treatment.         Problem List Patient Active Problem List   Diagnosis Date Noted  . Hematochezia [K92.1] 12/18/2017  . Multiple myeloma ([place]) [C90.00] 08/29/2017  . CKD (chronic kidney disease) [N18.9] 08/29/2017  . Symptomatic anemia [D64.9]   . 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 AKI (acute kidney injury) ([place]) [N17.9] 07/24/2017  . Closed fracture of transverse process of lumbar vertebra, with delayed healing, subsequent encounter [S32.009G] 07/24/2017  . Renal failure [N19] 07/23/2017  . Hypercalcemia [E83.52] 07/23/2017  . Anemia [D64.9] 07/23/2017  . Thrombocytopenia ([place]) [D69.6] 07/23/2017  . Diabetes mellitus type 2 in obese ([place]) [E11.69, E66.9] 07/23/2017    Past Medical History Past Medical History:  Diagnosis Date  . Cancer ([place])    multiple myeloma  . Cancer of right kidney ([place])   . Cervical dystonia   . Diabetes mellitus without complication [facility])     Past Surgical  History Past Surgical History:  Procedure Laterality Date  . CHOLECYSTECTOMY  2007  . COLONOSCOPY WITH PROPOFOL N/A 01/16/2018   Procedure: COLONOSCOPY WITH PROPOFOL;  Surgeon: Daneil Dolin, MD;  [facility];  Service: Endoscopy;  Laterality: N/A;  1:45pm  . ESOPHAGOGASTRODUODENOSCOPY (EGD) WITH PROPOFOL N/A 01/16/2018   Procedure: ESOPHAGOGASTRODUODENOSCOPY (EGD) WITH PROPOFOL;  Surgeon: Daneil Dolin, MD;  [facility];  Service: Endoscopy;  Laterality: N/A;  . NEPHRECTOMY Right 1998    cancer    Family History Family History  Problem Relation Age of Onset  . Heart failure Mother 69  . Dementia Father   . Colon cancer Neg Hx      Social History  reports that he has never smoked. He quit smokeless tobacco use about 8 months ago. His smokeless tobacco use included chew. He reports that he does not drink alcohol or use drugs.  Medications  Current Outpatient Medications:  .  acyclovir (ZOVIRAX) 400 MG tablet, Take 1 tablet (400 mg total) by mouth 2 (two) times daily., Disp: 60 tablet, Rfl: 3 .  allopurinol (ZYLOPRIM) 300 MG tablet, Take 300 mg by mouth every morning., Disp: , Rfl:  .  aspirin EC 81 MG tablet, Take 81 mg daily by mouth., Disp: , Rfl:  .  cyclobenzaprine (FLEXERIL) 10 MG tablet, Take 10 mg by mouth at bedtime., Disp: , Rfl:  .  dexamethasone (DECADRON) 4 MG tablet, Take 10 tablets (40 mg) on days 1 and 8 of chemo (velcade). Repeat every 21 days., Disp: 30 tablet, Rfl: 3 .  furosemide (LASIX) 40 MG tablet, Take 40 mg by mouth every Monday, Wednesday, and Friday. , Disp: , Rfl:  .  gabapentin (NEURONTIN) 300 MG capsule, Take 1 capsule (300 mg total) by mouth at bedtime., Disp: 30 capsule, Rfl: 0 .  glipiZIDE (GLIPIZIDE XL) 2.5 MG 24 hr tablet, Take 1 tablet (2.5 mg total) by mouth daily with breakfast., Disp: 30 tablet, Rfl: 0 .  lenalidomide (REVLIMID) 10 MG capsule, Take 1 capsule (10 mg) days 1-14. Then 7 days off.  Every 21 days., Disp: 14 capsule, Rfl: 0 .  Multiple Vitamins-Minerals (CENTRUM SILVER 50+MEN) TABS, Take 1 tablet by mouth every morning., Disp: , Rfl:  .  pravastatin (PRAVACHOL) 20 MG tablet, Take 20 mg by mouth at bedtime. , Disp: , Rfl:  .  sodium bicarbonate 650 MG tablet, Take 1 tablet (650 mg total) by mouth 2 (two) times daily. (Patient taking differently: Take 650 mg by mouth 3 (three) times daily. ), Disp: 60 tablet, Rfl: 1 .  traMADol (ULTRAM) 50 MG tablet, Take 1 tablet (50 mg total) every 6 (six) hours as needed by mouth., Disp: 30  tablet, Rfl: 0 No current facility-administered medications for this visit.   Facility-Administered Medications Ordered in Other Visits:  .  0.9 %  sodium chloride infusion, 250 mL, Intravenous, Once, Twana First, MD  Allergies Patient has no known allergies.  Review of Systems Review of Systems - Oncology ROS as per HPI otherwise 12 point ROS is negative.   Physical Exam  Vitals Wt Readings from Last 3 Encounters:  02/18/18 222 lb 9.6 oz (101 kg)  01/21/18 211 lb 14.4 oz (96.1 kg)  01/10/18 211 lb (95.7 kg)   Temp Readings from Last 3 Encounters:  02/18/18 98.1 F (36.7 C) (Oral)  01/21/18 98.1 F (36.7 C) (Oral)  01/16/18 98.2 F (36.8 C)   BP Readings from Last 3 Encounters:  02/18/18 (!) 159/71  01/21/18 (!) 148/71  01/16/18 (!) 153/93   Pulse Readings from Last 3 Encounters:  02/18/18 97  01/21/18 80  01/16/18 64   Constitutional: Well-developed, well-nourished, and in no distress.   HENT: Head: Normocephalic and atraumatic.  Mouth/Throat: No oropharyngeal exudate. Mucosa moist. Eyes: Pupils are equal, round, and reactive to light. Conjunctivae are normal. No scleral icterus.  Neck: Normal range of motion. Neck supple. No JVD present.  Cardiovascular: Normal rate, regular rhythm and normal heart sounds.  Exam reveals no gallop and no friction rub.   No murmur heard. Pulmonary/Chest: Effort normal and breath sounds normal. No respiratory distress. No wheezes.No rales.  Abdominal: Soft. Bowel sounds are normal. No distension. There is no tenderness. There is no guarding.  Musculoskeletal: No edema or tenderness.  Lymphadenopathy: No cervical, axillary or supraclavicular adenopathy.  Neurological: Alert and oriented to person, place, and time. No cranial nerve deficit.  Skin: Skin is warm and dry. No rash noted. No erythema. No pallor.  Psychiatric: Affect and judgment normal.   Labs Appointment on 02/18/2018  Component Date Value Ref Range Status  . Blood  Bank Specimen 02/18/2018 SAMPLE AVAILABLE FOR TESTING   Final  . Sample Expiration 02/18/2018    Final                   Value:02/21/2018 Performed at [facility], [address] [place], [place] [address]   . WBC 02/18/2018 4.9  4.0 - 10.5 K/uL Final  . RBC 02/18/2018 2.55* 4.22 - 5.81 MIL/uL Final  . Hemoglobin 02/18/2018 8.9* 13.0 - 17.0 g/dL Final  . HCT 02/18/2018 26.2* 39.0 - 52.0 % Final  . MCV 02/18/2018 102.7* 78.0 - 100.0 fL Final  . MCH 02/18/2018 34.9* 26.0 - 34.0 pg Final  . MCHC 02/18/2018 34.0  30.0 - 36.0 g/dL Final  . RDW 02/18/2018 16.2* 11.5 - 15.5 % Final  . Platelets 02/18/2018 156  150 - 400 K/uL Final  . Neutrophils Relative % 02/18/2018 68  % Final  . Neutro Abs 02/18/2018 3.4  1.7 - 7.7 K/uL Final  . Lymphocytes Relative 02/18/2018 23  % Final  . Lymphs Abs 02/18/2018 1.1  0.7 - 4.0 K/uL Final  . Monocytes Relative 02/18/2018 7  % Final  . Monocytes Absolute 02/18/2018 0.3  0.1 - 1.0 K/uL Final  . Eosinophils Relative 02/18/2018 2  % Final  . Eosinophils Absolute 02/18/2018 0.1  0.0 - 0.7 K/uL Final  . Basophils Relative 02/18/2018 0  % Final  . Basophils Absolute 02/18/2018 0.0  0.0 - 0.1 K/uL Final   Performed at [facility], [address], [place], [place] [address]  . Sodium 02/18/2018 137  135 - 145 mmol/L Final  . Potassium 02/18/2018 4.3  3.5 - 5.1 mmol/L Final  . Chloride 02/18/2018 98* 101 - 111 mmol/L Final  . CO2 02/18/2018 25  22 - 32 mmol/L Final  . Glucose, Bld 02/18/2018 201* 65 - 99 mg/dL Final  . BUN 02/18/2018 43* 6 - 20 mg/dL Final  . Creatinine, Ser 02/18/2018 2.73* 0.61 - 1.24 mg/dL Final  . Calcium 02/18/2018 8.9  8.9 - 10.3 mg/dL Final  . Total Protein 02/18/2018 6.2* 6.5 - 8.1 g/dL Final  . Albumin 02/18/2018 3.8  3.5 - 5.0 g/dL Final  . AST 02/18/2018 20  15 - 41 U/L Final  . ALT 02/18/2018 39  17 - 63 U/L Final  . Alkaline Phosphatase 02/18/2018 174* 38 - 126 U/L Final  . Total Bilirubin 02/18/2018  0.6  0.3 - 1.2 mg/dL Final   . GFR calc non Af Amer 02/18/2018 23* >60 mL/min Final  . GFR calc Af Amer 02/18/2018 26* >60 mL/min Final   Comment: (NOTE) The eGFR has been calculated using the CKD EPI equation. This calculation has not been validated in all clinical situations. eGFR's persistently <60 mL/min signify possible Chronic Kidney Disease.   Georgiann Hahn gap 02/18/2018 14  5 - 15 Final   Performed at [facility], [address], [place], [place] [address]     Pathology Orders Placed This Encounter  Procedures  . CBC with Differential/Platelet    Standing Status:   Future    Standing Expiration Date:   02/19/2019  . Comprehensive metabolic panel    Standing Status:   Future    Standing Expiration Date:   02/19/2019  . Lactate dehydrogenase    Standing Status:   Future    Standing Expiration Date:   02/19/2019  . Protein electrophoresis, serum    Standing Status:   Future    Standing Expiration Date:   02/19/2019  . Kappa/lambda light chains    Standing Status:   Future    Standing Expiration Date:   02/19/2019  . IgG, IgA, IgM    Standing Status:   Future    Standing Expiration Date:   02/19/2019  . Beta 2 microglobulin, serum    Standing Status:   Future    Standing Expiration Date:   02/19/2019       Zoila Shutter MD

## 2018-02-18 NOTE — Patient Instructions (Signed)
[facility]  Discharge Instructions:  You were seen by Dr. Higgs today _______________________________________________________________  Thank you for choosing [facility] at [facility] to provide your oncology and hematology care.  To afford each patient quality time with our providers, please arrive at least 15 minutes before your scheduled appointment.  You need to re-schedule your appointment if you arrive 10 or more minutes late.  We strive to give you quality time with our providers, and arriving late affects you and other patients whose appointments are after yours.  Also, if you no show three or more times for appointments you may be dismissed from the clinic.  Again, thank you for choosing [facility] at [facility]. Our hope is that these requests will allow you access to exceptional care and in a timely manner. _______________________________________________________________  If you have questions after your visit, please contact our office at [phone] between the hours of 8:30 a.m. and 5:00 p.m. Voicemails left after 4:30 p.m. will not be returned until the following business day. _______________________________________________________________  For prescription refill requests, have your pharmacy contact our office. _______________________________________________________________  Recommendations made by the consultant and any test results will be sent to your referring physician. _______________________________________________________________ 

## 2018-02-18 NOTE — Telephone Encounter (Signed)
Pt called to say he had labs done today for another physician, and was told to call our office so Dr. Gala Romney could review the updated CBC.

## 2018-02-21 ENCOUNTER — Telehealth (HOSPITAL_COMMUNITY): Payer: Self-pay

## 2018-02-21 NOTE — Telephone Encounter (Signed)
Lmom, waiting on a return call.  

## 2018-02-21 NOTE — Telephone Encounter (Signed)
Spoke with Karalee Height, BMT coordinator/nurse at [facility] for Mr. Mayorga. She states they did receive the labs drawn at [facility] on 4/23. Mr. Mcclish finishes this cycle of revlimid on 4/30. He had Velcade on 4/23. He has bone marrow biopsy on 5/2 at [facility]. Patient follows up at [facility] on 5/13 for testing. Jackelyn Poling states it takes 72 hours to get the results. Patient follows up with Dr. Walden Field on 5/20. Debbie states by that time she will have a calendar made for patient but will not have insurance approval yet. Dr. Walden Field says she is not going to restart his Revlimid or Velcade until she sees him on 5/20 and then it depends on the plan [facility] has for his BMT. Jackelyn Poling is okay with holding the Revlimid but she would like him to have one more Velcade injection on 5/14. Dr. Walden Field made aware of this.

## 2018-02-25 ENCOUNTER — Inpatient Hospital Stay (HOSPITAL_COMMUNITY): Payer: Medicare Other

## 2018-02-25 ENCOUNTER — Other Ambulatory Visit (HOSPITAL_COMMUNITY): Payer: Medicare Other

## 2018-02-26 ENCOUNTER — Other Ambulatory Visit: Payer: Self-pay | Admitting: Radiology

## 2018-02-27 ENCOUNTER — Ambulatory Visit (HOSPITAL_COMMUNITY)
Admission: RE | Admit: 2018-02-27 | Discharge: 2018-02-27 | Disposition: A | Discharge status: Home / Self Care | Payer: Medicare Other | Source: Ambulatory Visit | Attending: Adult Health | Admitting: Adult Health

## 2018-02-27 ENCOUNTER — Encounter (HOSPITAL_COMMUNITY): Payer: Self-pay

## 2018-02-27 DIAGNOSIS — E119 Type 2 diabetes mellitus without complications: Secondary | ICD-10-CM | POA: Insufficient documentation

## 2018-02-27 DIAGNOSIS — C9000 Multiple myeloma not having achieved remission: Secondary | ICD-10-CM | POA: Diagnosis not present

## 2018-02-27 DIAGNOSIS — Z85528 Personal history of other malignant neoplasm of kidney: Secondary | ICD-10-CM | POA: Insufficient documentation

## 2018-02-27 DIAGNOSIS — D696 Thrombocytopenia, unspecified: Secondary | ICD-10-CM | POA: Diagnosis not present

## 2018-02-27 DIAGNOSIS — Z7982 Long term (current) use of aspirin: Secondary | ICD-10-CM | POA: Insufficient documentation

## 2018-02-27 DIAGNOSIS — D539 Nutritional anemia, unspecified: Secondary | ICD-10-CM | POA: Diagnosis not present

## 2018-02-27 DIAGNOSIS — Z79899 Other long term (current) drug therapy: Secondary | ICD-10-CM | POA: Insufficient documentation

## 2018-02-27 DIAGNOSIS — D7589 Other specified diseases of blood and blood-forming organs: Secondary | ICD-10-CM | POA: Diagnosis not present

## 2018-02-27 DIAGNOSIS — Z7984 Long term (current) use of oral hypoglycemic drugs: Secondary | ICD-10-CM | POA: Insufficient documentation

## 2018-02-27 LAB — BASIC METABOLIC PANEL
ANION GAP: 12 (ref 5–15)
BUN: 49 mg/dL — ABNORMAL HIGH (ref 6–20)
CALCIUM: 9 mg/dL (ref 8.9–10.3)
CO2: 21 mmol/L — AB (ref 22–32)
Chloride: 106 mmol/L (ref 101–111)
Creatinine, Ser: 2.65 mg/dL — ABNORMAL HIGH (ref 0.61–1.24)
GFR, EST AFRICAN AMERICAN: 27 mL/min — AB (ref >60)
GFR, EST NON AFRICAN AMERICAN: 23 mL/min — AB (ref >60)
GLUCOSE: 185 mg/dL — AB (ref 65–99)
POTASSIUM: 4.4 mmol/L (ref 3.5–5.1)
Sodium: 139 mmol/L (ref 135–145)

## 2018-02-27 LAB — CBC WITH DIFFERENTIAL/PLATELET
BASOS ABS: 0 10*3/uL (ref 0.0–0.1)
Basophils Relative: 0 %
EOS ABS: 0.1 10*3/uL (ref 0.0–0.7)
Eosinophils Relative: 2 %
HEMATOCRIT: 26.8 % — AB (ref 39.0–52.0)
HEMOGLOBIN: 8.9 g/dL — AB (ref 13.0–17.0)
LYMPHS PCT: 23 %
Lymphs Abs: 1.2 10*3/uL (ref 0.7–4.0)
MCH: 34.5 pg — ABNORMAL HIGH (ref 26.0–34.0)
MCHC: 33.2 g/dL (ref 30.0–36.0)
MCV: 103.9 fL — ABNORMAL HIGH (ref 78.0–100.0)
MONOS PCT: 15 %
Monocytes Absolute: 0.8 10*3/uL (ref 0.1–1.0)
NEUTROS ABS: 3.2 10*3/uL (ref 1.7–7.7)
NEUTROS PCT: 60 %
Platelets: 96 10*3/uL — ABNORMAL LOW (ref 150–400)
RBC: 2.58 MIL/uL — ABNORMAL LOW (ref 4.22–5.81)
RDW: 15.7 % — ABNORMAL HIGH (ref 11.5–15.5)
WBC: 5.3 10*3/uL (ref 4.0–10.5)

## 2018-02-27 LAB — PROTIME-INR
INR: 0.96
PROTHROMBIN TIME: 12.7 s (ref 11.4–15.2)

## 2018-02-27 LAB — GLUCOSE, CAPILLARY: GLUCOSE-CAPILLARY: 184 mg/dL — AB (ref 65–99)

## 2018-02-27 MED ORDER — LIDOCAINE HCL (PF) 1 % IJ SOLN
INTRAMUSCULAR | Status: AC | PRN
  Administered 2018-02-27: 30 mL

## 2018-02-27 MED ORDER — MIDAZOLAM HCL 2 MG/2ML IJ SOLN
INTRAMUSCULAR | Status: AC | PRN
  Administered 2018-02-27 (×2): 1 mg via INTRAVENOUS

## 2018-02-27 MED ORDER — MIDAZOLAM HCL 2 MG/2ML IJ SOLN
INTRAMUSCULAR | Status: AC
  Filled 2018-02-27: qty 2

## 2018-02-27 MED ORDER — HYDROCODONE-ACETAMINOPHEN 5-325 MG PO TABS: 1.0000 | ORAL_TABLET | ORAL | Status: DC | PRN

## 2018-02-27 MED ORDER — SODIUM CHLORIDE 0.9 % IV SOLN
INTRAVENOUS | Status: DC
  Administered 2018-02-27: 09:00:00 via INTRAVENOUS

## 2018-02-27 MED ORDER — FENTANYL CITRATE (PF) 100 MCG/2ML IJ SOLN
INTRAMUSCULAR | Status: AC | PRN
  Administered 2018-02-27 (×2): 50 ug via INTRAVENOUS

## 2018-02-27 MED ORDER — FENTANYL CITRATE (PF) 100 MCG/2ML IJ SOLN
INTRAMUSCULAR | Status: AC
  Filled 2018-02-27: qty 2

## 2018-02-27 NOTE — Procedures (Signed)
  Procedure: CT bone marrow biopsy L iliac EBL:   minimal Complications:  none immediate  See full dictation in [facility].  Dillard Cannon MD Main # [phone] Pager  [phone]

## 2018-02-27 NOTE — Discharge Instructions (Signed)
Moderate Conscious Sedation, Adult, Care After °These instructions provide you with information about caring for yourself after your procedure. Your health care provider may also give you more specific instructions. Your treatment has been planned according to current medical practices, but problems sometimes occur. Call your health care provider if you have any problems or questions after your procedure. °What can I expect after the procedure? °After your procedure, it is common: °· To feel sleepy for several hours. °· To feel clumsy and have poor balance for several hours. °· To have poor judgment for several hours. °· To vomit if you eat too soon. ° °Follow these instructions at home: °For at least 24 hours after the procedure: ° °· Do not: °? Participate in activities where you could fall or become injured. °? Drive. °? Use heavy machinery. °? Drink alcohol. °? Take sleeping pills or medicines that cause drowsiness. °? Make important decisions or sign legal documents. °? Take care of children on your own. °· Rest. °Eating and drinking °· Follow the diet recommended by your health care provider. °· If you vomit: °? Drink water, juice, or soup when you can drink without vomiting. °? Make sure you have little or no nausea before eating solid foods. °General instructions °· Have a responsible adult stay with you until you are awake and alert. °· Take over-the-counter and prescription medicines only as told by your health care provider. °· If you smoke, do not smoke without supervision. °· Keep all follow-up visits as told by your health care provider. This is important. °Contact a health care provider if: °· You keep feeling nauseous or you keep vomiting. °· You feel light-headed. °· You develop a rash. °· You have a fever. °Get help right away if: °· You have trouble breathing. °This information is not intended to replace advice given to you by your health care provider. Make sure you discuss any questions you have  with your health care provider. °Document Released: 08/05/2013 Document Revised: 03/19/2016 Document Reviewed: 02/04/2016 °Elsevier Interactive Patient Education © 2018 [employer] °Bone Marrow Aspiration and Bone Marrow Biopsy, Adult, Care After °This sheet gives you information about how to care for yourself after your procedure. Your health care provider may also give you more specific instructions. If you have problems or questions, contact your health care provider. °What can I expect after the procedure? °After the procedure, it is common to have: °· Mild pain and tenderness. °· Swelling. °· Bruising. ° °Follow these instructions at home: °· Take over-the-counter or prescription medicines only as told by your health care provider. °· Do not take baths, swim, or use a hot tub until your health care provider approves. Ask if you can take a shower or have a sponge bath. °· Follow instructions from your health care provider about how to take care of the puncture site. Make sure you: °? Wash your hands with soap and water before you change your bandage (dressing). If soap and water are not available, use hand sanitizer. °? Change your dressing as told by your health care provider. °· Check your puncture site every day for signs of infection. Check for: °? More redness, swelling, or pain. °? More fluid or blood. °? Warmth. °? Pus or a bad smell. °· Return to your normal activities as told by your health care provider. Ask your health care provider what activities are safe for you. °· Do not drive for 24 hours if you were given a medicine to help you relax (sedative). °·   Keep all follow-up visits as told by your health care provider. This is important. °Contact a health care provider if: °· You have more redness, swelling, or pain around the puncture site. °· You have more fluid or blood coming from the puncture site. °· Your puncture site feels warm to the touch. °· You have pus or a bad smell coming from the  puncture site. °· You have a fever. °· Your pain is not controlled with medicine. °This information is not intended to replace advice given to you by your health care provider. Make sure you discuss any questions you have with your health care provider. °Document Released: 05/04/2005 Document Revised: 05/04/2016 Document Reviewed: 03/28/2016 °Elsevier Interactive Patient Education © 2018 Elsevier Inc. ° °

## 2018-02-27 NOTE — H&P (Signed)
Referring Physician(s): Dawson,Gretchen W/Higgs,V  Supervising Physician: Arne Cleveland  Patient Status:  [facility] OP  Chief Complaint:  "I'm having another bone biopsy"  Subjective: Patient familiar to IR service from prior bone marrow biopsy on 08/15/2017.  He has a history of multiple myeloma, status post treatment.  He presents again today for repeat CT-guided bone marrow biopsy for restaging prior to transplant evaluation.  He currently denies fever, headache, chest pain, dyspnea, cough, abdominal/back pain, nausea, vomiting or bleeding. He does  have lower extremity swelling. Past Medical History:  Diagnosis Date  . Cancer ([place])    multiple myeloma  . Cancer of right kidney ([place])   . Cervical dystonia   . Diabetes mellitus without complication [facility])    Past Surgical History:  Procedure Laterality Date  . CHOLECYSTECTOMY  2007  . COLONOSCOPY WITH PROPOFOL N/A 01/16/2018   Procedure: COLONOSCOPY WITH PROPOFOL;  Surgeon: Daneil Dolin, MD;  [facility];  Service: Endoscopy;  Laterality: N/A;  1:45pm  . ESOPHAGOGASTRODUODENOSCOPY (EGD) WITH PROPOFOL N/A 01/16/2018   Procedure: ESOPHAGOGASTRODUODENOSCOPY (EGD) WITH PROPOFOL;  Surgeon: Daneil Dolin, MD;  [facility];  Service: Endoscopy;  Laterality: N/A;  . NEPHRECTOMY Right 1998   cancer    Allergies: Patient has no known allergies.  Medications: Prior to Admission medications   Medication Sig Start Date End Date Taking? Authorizing Provider  acyclovir (ZOVIRAX) 400 MG tablet Take 1 tablet (400 mg total) by mouth 2 (two) times daily. 01/09/18  Yes Holley Bouche, NP  allopurinol (ZYLOPRIM) 300 MG tablet Take 300 mg by mouth every morning.   Yes [provider]  aspirin EC 81 MG tablet Take 81 mg daily by mouth.   Yes [provider]  cyclobenzaprine (FLEXERIL) 10 MG tablet Take 10 mg by mouth at bedtime.   Yes [provider]  dexamethasone (DECADRON) 4 MG tablet  Take 10 tablets (40 mg) on days 1 and 8 of chemo (velcade). Repeat every 21 days. 12/10/17  Yes Higgs, Mathis Dad, MD  furosemide (LASIX) 40 MG tablet Take 40 mg by mouth every Monday, Wednesday, and Friday.  10/28/17  Yes [provider]  gabapentin (NEURONTIN) 300 MG capsule Take 1 capsule (300 mg total) by mouth at bedtime. 01/29/18  Yes Higgs, Mathis Dad, MD  glipiZIDE (GLIPIZIDE XL) 2.5 MG 24 hr tablet Take 1 tablet (2.5 mg total) by mouth daily with breakfast. 07/31/17  Yes Kathie Dike, MD  lenalidomide (REVLIMID) 10 MG capsule Take 1 capsule (10 mg) days 1-14. Then 7 days off.  Every 21 days. 02/05/18  Yes Higgs, Mathis Dad, MD  Multiple Vitamins-Minerals (CENTRUM SILVER 50+MEN) TABS Take 1 tablet by mouth every morning.   Yes [provider]  pravastatin (PRAVACHOL) 20 MG tablet Take 20 mg by mouth at bedtime.    Yes [provider]  sodium bicarbonate 650 MG tablet Take 1 tablet (650 mg total) by mouth 2 (two) times daily. Patient taking differently: Take 650 mg by mouth 3 (three) times daily.  07/31/17  Yes Kathie Dike, MD  traMADol (ULTRAM) 50 MG tablet Take 1 tablet (50 mg total) every 6 (six) hours as needed by mouth. 09/12/17   Holley Bouche, NP  prochlorperazine (COMPAZINE) 10 MG tablet Take 1 tablet (10 mg total) by mouth every 6 (six) hours as needed (Nausea or vomiting). 08/29/17 09/05/17  Twana First, MD     Vital Signs: BP (!) 141/62 (BP Location: Right Arm)   Pulse 78  Temp (!) 97 F (36.1 C) (Oral)   Resp 18   SpO2 98%   Physical Exam awake, alert.  Chest clear to auscultation bilaterally.  Heart with regular rate and rhythm.  Abdomen obese, soft, positive bowel sounds, nontender.  Bilateral lower extremity edema noted  Imaging: No results found.  Labs:  CBC: Recent Labs    01/28/18 1043 02/04/18 1105 02/11/18 1120 02/18/18 1234  WBC 6.3 6.9 4.8 4.9  HGB 9.3* 9.0* 8.9* 8.9*  HCT 27.8* 26.9* 26.6* 26.2*  PLT 105* 58* 76* 156     COAGS: Recent Labs    08/15/17 0930 02/27/18 0910  INR 1.04 0.96    BMP: Recent Labs    02/04/18 1105 02/11/18 1120 02/18/18 1234 02/27/18 0910  NA 137 139 137 139  K 4.1 4.5 4.3 4.4  CL 99* 101 98* 106  CO2 _0 21*  GLUCOSE 197* 225* 201* 185*  BUN 43* 42* 43* 49*  CALCIUM 8.6* 8.9 8.9 9.0  CREATININE 2.68* 2.63* 2.73* 2.65*  GFRNONAA 23* 24* 23* 23*  GFRAA 27* 27* 26* 27*    LIVER FUNCTION TESTS: Recent Labs    01/28/18 1043 02/04/18 1105 02/11/18 1120 02/18/18 1234  BILITOT 0.9 0.8 0.7 0.6  AST 13* _1 ALT 23 34 25 39  ALKPHOS 151* 138* 155* 174*  PROT 6.4* 6.1* 6.5 6.2*  ALBUMIN 4.0 3.8 4.0 3.8    Assessment and Plan: Pt with history of multiple myeloma, status post treatment.  He presents  today for repeat CT-guided bone marrow biopsy for restaging prior to transplant evaluation.Risks and benefits discussed with the patient including, but not limited to bleeding, infection, damage to adjacent structures or low yield requiring additional tests.  All of the patient's questions were answered, patient is agreeable to proceed. Consent signed and in chart.     Electronically Signed: D. Rowe Robert, PA-C 02/27/2018, 10:01 AM   I spent a total of 20 minutes at the the patient's bedside AND on the patient's hospital floor or unit, greater than 50% of which was counseling/coordinating care for CT-guided bone marrow biopsy

## 2018-03-05 NOTE — Telephone Encounter (Signed)
LMOM, waiting on a return call.  

## 2018-03-05 NOTE — Telephone Encounter (Signed)
Spoke with pt labs were done at [facility] hospital. Note was routed to RMR previously.

## 2018-03-10 DIAGNOSIS — R918 Other nonspecific abnormal finding of lung field: Secondary | ICD-10-CM | POA: Diagnosis not present

## 2018-03-10 DIAGNOSIS — I517 Cardiomegaly: Secondary | ICD-10-CM | POA: Diagnosis not present

## 2018-03-10 DIAGNOSIS — Z5181 Encounter for therapeutic drug level monitoring: Secondary | ICD-10-CM | POA: Diagnosis not present

## 2018-03-10 DIAGNOSIS — Z79899 Other long term (current) drug therapy: Secondary | ICD-10-CM | POA: Diagnosis not present

## 2018-03-10 DIAGNOSIS — C9000 Multiple myeloma not having achieved remission: Secondary | ICD-10-CM | POA: Diagnosis not present

## 2018-03-10 DIAGNOSIS — Z01818 Encounter for other preprocedural examination: Secondary | ICD-10-CM | POA: Diagnosis not present

## 2018-03-11 ENCOUNTER — Ambulatory Visit (HOSPITAL_COMMUNITY): Payer: Medicare Other | Admitting: Internal Medicine

## 2018-03-11 ENCOUNTER — Inpatient Hospital Stay (HOSPITAL_COMMUNITY): Payer: Medicare Other

## 2018-03-11 ENCOUNTER — Encounter (HOSPITAL_COMMUNITY): Payer: Self-pay

## 2018-03-11 ENCOUNTER — Inpatient Hospital Stay (HOSPITAL_COMMUNITY): Payer: Medicare Other | Attending: Hematology

## 2018-03-11 ENCOUNTER — Other Ambulatory Visit (HOSPITAL_COMMUNITY): Payer: Medicare Other

## 2018-03-11 VITALS — BP 157/66 | HR 87 | Temp 97.9°F | Resp 18

## 2018-03-11 DIAGNOSIS — Z85528 Personal history of other malignant neoplasm of kidney: Secondary | ICD-10-CM | POA: Insufficient documentation

## 2018-03-11 DIAGNOSIS — C9000 Multiple myeloma not having achieved remission: Secondary | ICD-10-CM | POA: Diagnosis not present

## 2018-03-11 DIAGNOSIS — Z79899 Other long term (current) drug therapy: Secondary | ICD-10-CM | POA: Insufficient documentation

## 2018-03-11 DIAGNOSIS — Z905 Acquired absence of kidney: Secondary | ICD-10-CM | POA: Insufficient documentation

## 2018-03-11 DIAGNOSIS — D696 Thrombocytopenia, unspecified: Secondary | ICD-10-CM | POA: Diagnosis not present

## 2018-03-11 DIAGNOSIS — D649 Anemia, unspecified: Secondary | ICD-10-CM | POA: Insufficient documentation

## 2018-03-11 LAB — COMPREHENSIVE METABOLIC PANEL
ALBUMIN: 4.2 g/dL (ref 3.5–5.0)
ALT: 21 U/L (ref 17–63)
AST: 16 U/L (ref 15–41)
Alkaline Phosphatase: 172 U/L — ABNORMAL HIGH (ref 38–126)
Anion gap: 10 (ref 5–15)
BUN: 44 mg/dL — AB (ref 6–20)
CHLORIDE: 101 mmol/L (ref 101–111)
CO2: 26 mmol/L (ref 22–32)
CREATININE: 2.73 mg/dL — AB (ref 0.61–1.24)
Calcium: 9.3 mg/dL (ref 8.9–10.3)
GFR calc Af Amer: 26 mL/min — ABNORMAL LOW (ref >60)
GFR calc non Af Amer: 23 mL/min — ABNORMAL LOW (ref >60)
GLUCOSE: 259 mg/dL — AB (ref 65–99)
POTASSIUM: 4.5 mmol/L (ref 3.5–5.1)
Sodium: 137 mmol/L (ref 135–145)
Total Bilirubin: 0.7 mg/dL (ref 0.3–1.2)
Total Protein: 6.7 g/dL (ref 6.5–8.1)

## 2018-03-11 LAB — CHROMOSOME ANALYSIS, BONE MARROW

## 2018-03-11 LAB — CBC WITH DIFFERENTIAL/PLATELET
BASOS ABS: 0 10*3/uL (ref 0.0–0.1)
BASOS PCT: 0 %
Eosinophils Absolute: 0.1 10*3/uL (ref 0.0–0.7)
Eosinophils Relative: 1 %
HEMATOCRIT: 24.8 % — AB (ref 39.0–52.0)
Hemoglobin: 8.4 g/dL — ABNORMAL LOW (ref 13.0–17.0)
Lymphocytes Relative: 26 %
Lymphs Abs: 1.3 10*3/uL (ref 0.7–4.0)
MCH: 34.6 pg — ABNORMAL HIGH (ref 26.0–34.0)
MCHC: 33.9 g/dL (ref 30.0–36.0)
MCV: 102.1 fL — ABNORMAL HIGH (ref 78.0–100.0)
MONO ABS: 0.3 10*3/uL (ref 0.1–1.0)
Monocytes Relative: 6 %
NEUTROS PCT: 67 %
Neutro Abs: 3.4 10*3/uL (ref 1.7–7.7)
PLATELETS: 150 10*3/uL (ref 150–400)
RBC: 2.43 MIL/uL — AB (ref 4.22–5.81)
RDW: 15.9 % — AB (ref 11.5–15.5)
WBC: 5.1 10*3/uL (ref 4.0–10.5)

## 2018-03-11 LAB — SAMPLE TO BLOOD BANK

## 2018-03-11 LAB — LACTATE DEHYDROGENASE: LDH: 119 U/L (ref 98–192)

## 2018-03-11 MED ORDER — PROCHLORPERAZINE MALEATE 10 MG PO TABS
ORAL_TABLET | ORAL | Status: AC
  Filled 2018-03-11: qty 1

## 2018-03-11 MED ORDER — BORTEZOMIB CHEMO SQ INJECTION 3.5 MG (2.5MG/ML)
1.3000 mg/m2 | Freq: Once | INTRAMUSCULAR | Status: AC
  Administered 2018-03-11: 2.75 mg via SUBCUTANEOUS
  Filled 2018-03-11: qty 2.75

## 2018-03-11 MED ORDER — PROCHLORPERAZINE MALEATE 10 MG PO TABS
10.0000 mg | ORAL_TABLET | Freq: Once | ORAL | Status: AC
  Administered 2018-03-11: 10 mg via ORAL

## 2018-03-11 NOTE — Patient Instructions (Signed)
[facility] Discharge Instructions for Patients Receiving Chemotherapy   Beginning January 23rd 2017 lab work for the Cancer Center will be done in the  Main lab at [place] on 1st floor. If you have a lab appointment with the Cancer Center please come in thru the  Main Entrance and check in at the main information desk   Today you received the following chemotherapy agents Velcade injection. Follow-up as scheduled. Call clinic for any questions or concerns  To help prevent nausea and vomiting after your treatment, we encourage you to take your nausea medication   If you develop nausea and vomiting, or diarrhea that is not controlled by your medication, call the clinic.  The clinic phone number is [phone]. Office hours are Monday-Friday 8:30am-5:00pm.  BELOW ARE SYMPTOMS THAT SHOULD BE REPORTED IMMEDIATELY:  *FEVER GREATER THAN 101.0 F  *CHILLS WITH OR WITHOUT FEVER  NAUSEA AND VOMITING THAT IS NOT CONTROLLED WITH YOUR NAUSEA MEDICATION  *UNUSUAL SHORTNESS OF BREATH  *UNUSUAL BRUISING OR BLEEDING  TENDERNESS IN MOUTH AND THROAT WITH OR WITHOUT PRESENCE OF ULCERS  *URINARY PROBLEMS  *BOWEL PROBLEMS  UNUSUAL RASH Items with * indicate a potential emergency and should be followed up as soon as possible. If you have an emergency after office hours please contact your primary care physician or go to the nearest emergency department.  Please call the clinic during office hours if you have any questions or concerns.   You may also contact the Patient Navigator at [phone] should you have any questions or need assistance in obtaining follow up care.      Resources For Cancer Patients and their Caregivers ? American Cancer Society: Can assist with transportation, wigs, general needs, runs Look Good Feel Better.        1-888-227-6333 ? Cancer Care: Provides financial assistance, online support groups, medication/co-pay assistance.   1-800-813-HOPE (4673) ? [facility] Assists [place] cancer patients and their families through emotional , educational and financial support.  336-427-4357 ? [place] DSS Where to apply for food stamps, Medicaid and utility assistance. 336-342-1394 ? RCATS: Transportation to medical appointments. 336-347-2287 ? Social Security Administration: May apply for disability if have a Stage IV cancer. 336-342-7796 1-800-772-1213 ? [place] Aging, Disability and Transit Services: Assists with nutrition, care and transit needs. 336-349-2343         

## 2018-03-11 NOTE — Progress Notes (Signed)
Tom Marshall tolerated Velcade injection well without complaints or incident. Labs reviewed with Dr. Walden Field prior to administering this injection. VSS Pt discharged self ambulatory in satisfactory condition

## 2018-03-12 DIAGNOSIS — E872 Acidosis: Secondary | ICD-10-CM | POA: Diagnosis not present

## 2018-03-12 DIAGNOSIS — D638 Anemia in other chronic diseases classified elsewhere: Secondary | ICD-10-CM | POA: Diagnosis not present

## 2018-03-12 DIAGNOSIS — C9000 Multiple myeloma not having achieved remission: Secondary | ICD-10-CM | POA: Diagnosis not present

## 2018-03-12 DIAGNOSIS — N184 Chronic kidney disease, stage 4 (severe): Secondary | ICD-10-CM | POA: Diagnosis not present

## 2018-03-12 DIAGNOSIS — I10 Essential (primary) hypertension: Secondary | ICD-10-CM | POA: Diagnosis not present

## 2018-03-12 LAB — IGG, IGA, IGM
IGA: 102 mg/dL (ref 61–437)
IGG (IMMUNOGLOBIN G), SERUM: 268 mg/dL — AB (ref 700–1600)
IGM (IMMUNOGLOBULIN M), SRM: 25 mg/dL (ref 20–172)

## 2018-03-12 LAB — PROTEIN ELECTROPHORESIS, SERUM
A/G Ratio: 1.6 (ref 0.7–1.7)
ALBUMIN ELP: 3.7 g/dL (ref 2.9–4.4)
ALPHA-1-GLOBULIN: 0.2 g/dL (ref 0.0–0.4)
Alpha-2-Globulin: 0.8 g/dL (ref 0.4–1.0)
Beta Globulin: 0.9 g/dL (ref 0.7–1.3)
GLOBULIN, TOTAL: 2.3 g/dL (ref 2.2–3.9)
Gamma Globulin: 0.3 g/dL — ABNORMAL LOW (ref 0.4–1.8)
Total Protein ELP: 6 g/dL (ref 6.0–8.5)

## 2018-03-12 LAB — KAPPA/LAMBDA LIGHT CHAINS
KAPPA FREE LGHT CHN: 23.6 mg/L — AB (ref 3.3–19.4)
Kappa, lambda light chain ratio: 1.87 — ABNORMAL HIGH (ref 0.26–1.65)
LAMDA FREE LIGHT CHAINS: 12.6 mg/L (ref 5.7–26.3)

## 2018-03-12 LAB — BETA 2 MICROGLOBULIN, SERUM: Beta-2 Microglobulin: 4.7 mg/L — ABNORMAL HIGH (ref 0.6–2.4)

## 2018-03-17 ENCOUNTER — Other Ambulatory Visit: Payer: Self-pay

## 2018-03-17 ENCOUNTER — Ambulatory Visit (HOSPITAL_COMMUNITY): Payer: Medicare Other | Admitting: Internal Medicine

## 2018-03-17 ENCOUNTER — Encounter (HOSPITAL_COMMUNITY): Payer: Self-pay | Admitting: Internal Medicine

## 2018-03-17 ENCOUNTER — Inpatient Hospital Stay (HOSPITAL_BASED_OUTPATIENT_CLINIC_OR_DEPARTMENT_OTHER): Payer: Medicare Other | Admitting: Internal Medicine

## 2018-03-17 DIAGNOSIS — C9000 Multiple myeloma not having achieved remission: Secondary | ICD-10-CM | POA: Diagnosis not present

## 2018-03-17 DIAGNOSIS — Z905 Acquired absence of kidney: Secondary | ICD-10-CM | POA: Diagnosis not present

## 2018-03-17 DIAGNOSIS — Z79899 Other long term (current) drug therapy: Secondary | ICD-10-CM

## 2018-03-17 DIAGNOSIS — D649 Anemia, unspecified: Secondary | ICD-10-CM | POA: Diagnosis not present

## 2018-03-17 DIAGNOSIS — D696 Thrombocytopenia, unspecified: Secondary | ICD-10-CM

## 2018-03-17 DIAGNOSIS — Z85528 Personal history of other malignant neoplasm of kidney: Secondary | ICD-10-CM

## 2018-03-17 DIAGNOSIS — N289 Disorder of kidney and ureter, unspecified: Secondary | ICD-10-CM

## 2018-03-17 DIAGNOSIS — G629 Polyneuropathy, unspecified: Secondary | ICD-10-CM

## 2018-03-17 MED ORDER — GABAPENTIN 300 MG PO CAPS: 300.0000 mg | ORAL_CAPSULE | Freq: Every day | ORAL | 4 refills | Status: DC

## 2018-03-17 NOTE — Progress Notes (Signed)
Diagnosis Neuropathy - Plan: CBC with Differential/Platelet, Comprehensive metabolic panel, Lactate dehydrogenase, Protein electrophoresis, serum, IgG, IgA, IgM, Kappa/lambda light chains, Beta 2 microglobulin, serum, gabapentin (NEURONTIN) 300 MG capsule  Staging Cancer Staging No matching staging information was found for the patient.  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  He was being treated with Revlimid, Velcade and low-dose Decadron under the direction of Dr. Lebron Conners.  He underwent repeat bone marrow biopsy on Feb 27, 2018 at [facility] that showed no diagnostic or definitive evidence of residual plasma cell neoplasm with < 1% plasma cells.  He has been seen at [facility] on Mar 10, 2018 for follow-up.  Velcade is on hold.  He will start GCSF on 03/28/2018 per [facility] recommendations.  He was provided calender by their office.  We will see the pt back in 4 weeks for follow-up and repeat labs.    2.  Renal insufficiency.  The patient reports he had a history of right kidney cancer and had right nephrectomy performed.  Labs done 03/11/2018 show Creatinine is 2.73 which is stable .  Revlimid on hold.   Will repeat chemistries on return to clinic.  3.  Anemia.  Hemoglobin is 8.4.  Will repeat labs on RTC.    4.  Thrombocytopenia.  Platelet count is improved at 150,000.   Will repeat labs on RTC in 03/2018  5.  Right kidney cancer.  Pt underwent right nephrectomy in 1998.  Will continue to monitor renal function.    6.  Neuropathy.  Pt reports neurontin helped symptoms.  He is given RX today with 60 tablets and 4 refills.     Interval history: 67 y.o. male with diagnosis of multiple myeloma IgA kappa.  He is being treated with Revlimid, bortezomib, and low-dose dexamethasone under the direction of Dr. Lebron Conners.  Previous cycle of therapy complicated by grade 3 anemia necessitating holding of bortezomib per Dr. Lebron Conners.    Current status: The patient is seen today for follow-up.  He is here to go over  Bone marrow biopsy.  He reports Neurontin helped neuropathy and he is requesting a prescription.      Multiple myeloma ([place])   08/29/2017 Initial Diagnosis    Multiple myeloma ([place])      12/02/2017 -  Chemotherapy    The patient had bortezomib SQ (VELCADE) chemo injection 2.75 mg, 1.3 mg/m2 = 2.75 mg, Subcutaneous,  Once, 5 of 5 cycles Administration: 2.75 mg (12/10/2017), 2.75 mg (12/17/2017), 2.75 mg (12/31/2017), 2.75 mg (01/21/2018), 2.75 mg (02/18/2018), 2.75 mg (03/11/2018)  for chemotherapy treatment.         Problem List Patient Active Problem List   Diagnosis Date Noted  . Hematochezia [K92.1] 12/18/2017  . Multiple myeloma ([place]) [C90.00] 08/29/2017  . CKD (chronic kidney disease) [N18.9] 08/29/2017  . Symptomatic anemia [D64.9]   . 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 AKI (acute kidney injury) ([place]) [N17.9] 07/24/2017  . Closed fracture of transverse process of lumbar vertebra, with delayed healing, subsequent encounter [S32.009G] 07/24/2017  . Renal failure [N19] 07/23/2017  . Hypercalcemia [E83.52] 07/23/2017  . Anemia [D64.9] 07/23/2017  . Thrombocytopenia ([place]) [D69.6] 07/23/2017  . Diabetes mellitus type 2 in obese ([place]) [E11.69, E66.9] 07/23/2017    Past Medical History Past Medical History:  Diagnosis Date  . Cancer ([place])    multiple myeloma  . Cancer of right kidney ([place])   . Cervical dystonia   . Diabetes mellitus without complication [facility])     Past Surgical  History Past Surgical History:  Procedure Laterality Date  . CHOLECYSTECTOMY  2007  . COLONOSCOPY WITH PROPOFOL N/A 01/16/2018   Procedure: COLONOSCOPY WITH PROPOFOL;  Surgeon: Daneil Dolin, MD;  [facility];  Service: Endoscopy;  Laterality: N/A;  1:45pm  . ESOPHAGOGASTRODUODENOSCOPY (EGD) WITH PROPOFOL N/A 01/16/2018   Procedure: ESOPHAGOGASTRODUODENOSCOPY (EGD) WITH PROPOFOL;  Surgeon: Daneil Dolin, MD;  Location: [facility];  Service: Endoscopy;  Laterality: N/A;  .  NEPHRECTOMY Right 1998   cancer    Family History Family History  Problem Relation Age of Onset  . Heart failure Mother 39  . Dementia Father   . Colon cancer Neg Hx      Social History  reports that he has never smoked. He quit smokeless tobacco use about 9 months ago. His smokeless tobacco use included chew. He reports that he does not drink alcohol or use drugs.  Medications  Current Outpatient Medications:  .  acyclovir (ZOVIRAX) 400 MG tablet, Take 1 tablet (400 mg total) by mouth 2 (two) times daily., Disp: 60 tablet, Rfl: 3 .  allopurinol (ZYLOPRIM) 300 MG tablet, Take 300 mg by mouth every morning., Disp: , Rfl:  .  aspirin EC 81 MG tablet, Take 81 mg daily by mouth., Disp: , Rfl:  .  cyclobenzaprine (FLEXERIL) 10 MG tablet, Take 10 mg by mouth at bedtime., Disp: , Rfl:  .  dexamethasone (DECADRON) 4 MG tablet, Take 10 tablets (40 mg) on days 1 and 8 of chemo (velcade). Repeat every 21 days., Disp: 30 tablet, Rfl: 3 .  furosemide (LASIX) 40 MG tablet, Take 40 mg by mouth every Monday, Wednesday, and Friday. , Disp: , Rfl:  .  gabapentin (NEURONTIN) 300 MG capsule, Take 1 capsule (300 mg total) by mouth at bedtime., Disp: 60 capsule, Rfl: 4 .  glipiZIDE (GLIPIZIDE XL) 2.5 MG 24 hr tablet, Take 1 tablet (2.5 mg total) by mouth daily with breakfast., Disp: 30 tablet, Rfl: 0 .  lenalidomide (REVLIMID) 10 MG capsule, Take 1 capsule (10 mg) days 1-14. Then 7 days off.  Every 21 days., Disp: 14 capsule, Rfl: 0 .  Multiple Vitamins-Minerals (CENTRUM SILVER 50+MEN) TABS, Take 1 tablet by mouth every morning., Disp: , Rfl:  .  pravastatin (PRAVACHOL) 20 MG tablet, Take 20 mg by mouth at bedtime. , Disp: , Rfl:  .  sodium bicarbonate 650 MG tablet, Take 1 tablet (650 mg total) by mouth 2 (two) times daily. (Patient taking differently: Take 650 mg by mouth 3 (three) times daily. ), Disp: 60 tablet, Rfl: 1 .  traMADol (ULTRAM) 50 MG tablet, Take 1 tablet (50 mg total) every 6 (six) hours as  needed by mouth., Disp: 30 tablet, Rfl: 0 No current facility-administered medications for this visit.   Facility-Administered Medications Ordered in Other Visits:  .  0.9 %  sodium chloride infusion, 250 mL, Intravenous, Once, Twana First, MD  Allergies Patient has no known allergies.  Review of Systems Review of Systems - Oncology ROS as per HPI otherwise 12 point ROS is negative.   Physical Exam  Vitals Wt Readings from Last 3 Encounters:  03/17/18 223 lb 14.4 oz (101.6 kg)  02/18/18 222 lb 9.6 oz (101 kg)  01/21/18 211 lb 14.4 oz (96.1 kg)   Temp Readings from Last 3 Encounters:  03/17/18 98.1 F (36.7 C) (Oral)  03/11/18 97.9 F (36.6 C) (Oral)  02/27/18 98.1 F (36.7 C) (Oral)   BP Readings from Last 3 Encounters:  03/17/18 [name])  165/75  03/11/18 (!) 157/66  02/27/18 (!) 151/72   Pulse Readings from Last 3 Encounters:  03/17/18 90  03/11/18 87  02/27/18 74   Constitutional: Well-developed, well-nourished, and in no distress.   HENT: Head: Normocephalic and atraumatic.  Mouth/Throat: No oropharyngeal exudate. Mucosa moist. Eyes: Pupils are equal, round, and reactive to light. Conjunctivae are normal. No scleral icterus.  Neck: Normal range of motion. Neck supple. No JVD present.  Cardiovascular: Normal rate, regular rhythm and normal heart sounds.  Exam reveals no gallop and no friction rub.   No murmur heard. Pulmonary/Chest: Effort normal and breath sounds normal. No respiratory distress. No wheezes.No rales.  Abdominal: Soft. Bowel sounds are normal. No distension. There is no tenderness. There is no guarding.  Musculoskeletal: No edema or tenderness.  Lymphadenopathy: No cervical, axillary or supraclavicular adenopathy.  Neurological: Alert and oriented to person, place, and time. No cranial nerve deficit.  Skin: Skin is warm and dry. No rash noted. No erythema. No pallor.  Psychiatric: Affect and judgment normal.  Left Bone marrow biopsy site NT, no  bleeding, healed well.    Labs No visits with results within 3 Day(s) from this visit.  Latest known visit with results is:  Appointment on 03/11/2018  Component Date Value Ref Range Status  . Blood Bank Specimen 03/11/2018 SAMPLE AVAILABLE FOR TESTING   Final  . Sample Expiration 03/11/2018    Final                   Value:03/14/2018 Performed at [facility], [address], [place], [place] [address]   . WBC 03/11/2018 5.1  4.0 - 10.5 K/uL Final  . RBC 03/11/2018 2.43* 4.22 - 5.81 MIL/uL Final  . Hemoglobin 03/11/2018 8.4* 13.0 - 17.0 g/dL Final  . HCT 03/11/2018 24.8* 39.0 - 52.0 % Final  . MCV 03/11/2018 102.1* 78.0 - 100.0 fL Final  . MCH 03/11/2018 34.6* 26.0 - 34.0 pg Final  . MCHC 03/11/2018 33.9  30.0 - 36.0 g/dL Final  . RDW 03/11/2018 15.9* 11.5 - 15.5 % Final  . Platelets 03/11/2018 150  150 - 400 K/uL Final  . Neutrophils Relative % 03/11/2018 67  % Final  . Neutro Abs 03/11/2018 3.4  1.7 - 7.7 K/uL Final  . Lymphocytes Relative 03/11/2018 26  % Final  . Lymphs Abs 03/11/2018 1.3  0.7 - 4.0 K/uL Final  . Monocytes Relative 03/11/2018 6  % Final  . Monocytes Absolute 03/11/2018 0.3  0.1 - 1.0 K/uL Final  . Eosinophils Relative 03/11/2018 1  % Final  . Eosinophils Absolute 03/11/2018 0.1  0.0 - 0.7 K/uL Final  . Basophils Relative 03/11/2018 0  % Final  . Basophils Absolute 03/11/2018 0.0  0.0 - 0.1 K/uL Final   Performed at [facility], [address], [place], [place] [address]  . Sodium 03/11/2018 137  135 - 145 mmol/L Final  . Potassium 03/11/2018 4.5  3.5 - 5.1 mmol/L Final  . Chloride 03/11/2018 101  101 - 111 mmol/L Final  . CO2 03/11/2018 26  22 - 32 mmol/L Final  . Glucose, Bld 03/11/2018 259* 65 - 99 mg/dL Final  . BUN 03/11/2018 44* 6 - 20 mg/dL Final  . Creatinine, Ser 03/11/2018 2.73* 0.61 - 1.24 mg/dL Final  . Calcium 03/11/2018 9.3  8.9 - 10.3 mg/dL Final  . Total Protein 03/11/2018 6.7  6.5 - 8.1 g/dL Final  . Albumin 03/11/2018 4.2  3.5 - 5.0 g/dL  Final  . AST  03/11/2018 16  15 - 41 U/L Final  . ALT 03/11/2018 21  17 - 63 U/L Final  . Alkaline Phosphatase 03/11/2018 172* 38 - 126 U/L Final  . Total Bilirubin 03/11/2018 0.7  0.3 - 1.2 mg/dL Final  . GFR calc non Af Amer 03/11/2018 23* >60 mL/min Final  . GFR calc Af Amer 03/11/2018 26* >60 mL/min Final   Comment: (NOTE) The eGFR has been calculated using the CKD EPI equation. This calculation has not been validated in all clinical situations. eGFR's persistently <60 mL/min signify possible Chronic Kidney Disease.   Georgiann Hahn gap 03/11/2018 10  5 - 15 Final   Performed at [facility], [address] [place], [place] [address]  . LDH 03/11/2018 119  98 - 192 U/L Final   Performed at [facility], [address], [place], [place] [address]  . Total Protein ELP 03/11/2018 6.0  6.0 - 8.5 g/dL Final  . Albumin ELP 03/11/2018 3.7  2.9 - 4.4 g/dL Final  . Alpha-1-Globulin 03/11/2018 0.2  0.0 - 0.4 g/dL Final  . Alpha-2-Globulin 03/11/2018 0.8  0.4 - 1.0 g/dL Final  . Beta Globulin 03/11/2018 0.9  0.7 - 1.3 g/dL Final  . Gamma Globulin 03/11/2018 0.3* 0.4 - 1.8 g/dL Final  . M-Spike, % 03/11/2018 Not Observed  Not Observed g/dL Final  . SPE Interp. 03/11/2018 Comment   Final   Comment: (NOTE) The SPE pattern reflects hypogammaglobulinemia. Lowered gamma globulin levels may be found with monoclonal gammopathies, B-cell deficiency states, protein losing diseases, or may be transient in children. Serum immunofixation and examination of the urine for Bence Jones protein may be indicated if warranted by the clinical picture. Performed At: [facility] [place], [place] [id number] Rush Farmer MD [id number]   . Comment 03/11/2018 Comment   Final   Comment: (NOTE) Protein electrophoresis scan will follow via computer, mail, or courier delivery.   [name] GLOBULIN, TOTAL 03/11/2018 2.3  2.2 - 3.9 g/dL Corrected  . A/G Ratio 03/11/2018 1.6  0.7 - 1.7 Corrected    Performed at [facility], [address], [place], [place] [address]  . Kappa free light chain 03/11/2018 23.6* 3.3 - 19.4 mg/L Final  . Lamda free light chains 03/11/2018 12.6  5.7 - 26.3 mg/L Final  . Kappa, lamda light chain ratio 03/11/2018 1.87* 0.26 - 1.65 Final   Comment: (NOTE) Performed At: [facility] [place], [place] [id number] Rush Farmer MD [id number] Performed at [facility], [address], [place], [place] [address]   . IgG (Immunoglobin G), Serum 03/11/2018 268* 700 - 1,600 mg/dL Final   Result confirmed on concentration.  . IgA 03/11/2018 102  61 - 437 mg/dL Final  . IgM (Immunoglobulin M), Srm 03/11/2018 25  20 - 172 mg/dL Final   Comment: (NOTE) Result confirmed on concentration. Performed At: [facility] [place], [place] [id number] Rush Farmer MD GQ:[id number] Performed at [facility], [address] [place], [place] [address]   . Beta-2 Microglobulin 03/11/2018 4.7* 0.6 - 2.4 mg/L Final   Comment: (NOTE) Siemens Immulite 2000 Immunochemiluminometric assay (ICMA) Values obtained with different assay methods or kits cannot be used interchangeably. Results cannot be interpreted as absolute evidence of the presence or absence of malignant disease. Performed At: [facility] [place], [place] [id number] Rush Farmer MD VW:[id number] Performed at [facility], [address] [place], [place] [address]      Pathology Orders Placed This Encounter  Procedures  . CBC with Differential/Platelet    Standing Status:   Future    Standing Expiration Date:   03/18/2019  . Comprehensive metabolic panel    Standing Status:   Future    Standing Expiration Date:   03/18/2019  . Lactate dehydrogenase    Standing Status:   Future    Standing Expiration Date:   03/18/2019  . Protein electrophoresis, serum    Standing Status:   Future    Standing Expiration Date:   03/18/2019  . IgG, IgA, IgM     Standing Status:   Future    Standing Expiration Date:   03/18/2019  . Kappa/lambda light chains    Standing Status:   Future    Standing Expiration Date:   03/18/2019  . Beta 2 microglobulin, serum    Standing Status:   Future    Standing Expiration Date:   03/18/2019       Zoila Shutter MD

## 2018-03-17 NOTE — Patient Instructions (Signed)
[facility] Discharge Instructions  Today you saw Dr. Higgs.    Thank you for choosing [facility] at [facility] to provide your oncology and hematology care.  To afford each patient quality time with our provider, please arrive at least 15 minutes before your scheduled appointment time.   If you have a lab appointment with the Cancer Center please come in thru the  Main Entrance and check in at the main information desk  You need to re-schedule your appointment should you arrive 10 or more minutes late.  We strive to give you quality time with our providers, and arriving late affects you and other patients whose appointments are after yours.  Also, if you no show three or more times for appointments you may be dismissed from the clinic at the providers discretion.     Again, thank you for choosing [facility].  Our hope is that these requests will decrease the amount of time that you wait before being seen by our physicians.       _____________________________________________________________  Should you have questions after your visit to [facility], please contact our office at [phone] between the hours of 8:30 a.m. and 4:30 p.m.  Voicemails left after 4:30 p.m. will not be returned until the following business day.  For prescription refill requests, have your pharmacy contact our office.       Resources For Cancer Patients and their Caregivers ? American Cancer Society: Can assist with transportation, wigs, general needs, runs Look Good Feel Better.        1-888-227-6333 ? Cancer Care: Provides financial assistance, online support groups, medication/co-pay assistance.  1-800-813-HOPE (4673) ? [facility] Assists [place] cancer patients and their families through emotional , educational and financial support.  336-427-4357 ? [place] DSS Where to apply for  food stamps, Medicaid and utility assistance. 336-342-1394 ? RCATS: Transportation to medical appointments. 336-347-2287 ? Social Security Administration: May apply for disability if have a Stage IV cancer. 336-342-7796 1-800-772-1213 ? [place] Aging, Disability and Transit Services: Assists with nutrition, care and transit needs. 336-349-2343  Cancer Center Support Programs:   > Cancer Support Group  2nd Tuesday of the month 1pm-2pm, Journey Room   > Creative Journey  3rd Tuesday of the month 1130am-1pm, Journey Room    

## 2018-03-18 ENCOUNTER — Encounter: Payer: Self-pay | Admitting: Nurse Practitioner

## 2018-03-18 ENCOUNTER — Ambulatory Visit (INDEPENDENT_AMBULATORY_CARE_PROVIDER_SITE_OTHER): Payer: Medicare Other | Admitting: Nurse Practitioner

## 2018-03-18 VITALS — BP 154/78 | HR 98 | Temp 97.0°F | Ht 66.0 in | Wt 226.2 lb

## 2018-03-18 DIAGNOSIS — K921 Melena: Secondary | ICD-10-CM | POA: Diagnosis not present

## 2018-03-18 DIAGNOSIS — D649 Anemia, unspecified: Secondary | ICD-10-CM | POA: Diagnosis not present

## 2018-03-18 DIAGNOSIS — K648 Other hemorrhoids: Secondary | ICD-10-CM | POA: Diagnosis not present

## 2018-03-18 NOTE — Assessment & Plan Note (Signed)
Hematochezia with multifactorial anemia.  EGD and colonoscopy essentially unremarkable.  Colonoscopy did find internal hemorrhoids.  He notes some hemorrhoid symptoms when he has bleeding.  Continues to have bleeding currently about twice a week.  Very likely that internal hemorrhoids could be causing his symptoms.  He does have a history of multiple myeloma currently undergoing treatment [facility] as well as [facility] where they are planning stem cell harvest for possible future bone marrow transplant.  At this point to wrap up endoscopic evaluation given his current situation we will plan for capsule endoscopy.  He denies permanent pacemaker, defibrillator.  Is never had gastrointestinal surgery, bowel obstructions, inflammatory bowel diseases.  No noted strictures or narrowings.  We will schedule this and plan for follow-up in 3 months.

## 2018-03-18 NOTE — Progress Notes (Signed)
cc'ed to pcp °

## 2018-03-18 NOTE — Patient Instructions (Signed)
1. As we discussed, if you have chest pain, shortness of breath, weakness, dizziness, lightheadedness, passing out, nearly passing out, etc. you can notify either our office or oncology.  If we are closed you can proceed to the emergency department. 2. We will call you to schedule the capsule endoscopy. 3. Call us if you have any questions or concerns. 4. Follow-up in 3 months.  At [facility] we value your feedback. You may receive a survey about your visit today. Please share your experience as we strive to create trusting relationships with our patients to provide genuine, compassionate, quality care.  It was great to see you today!  I hope you have a wonderful summer!!!

## 2018-03-18 NOTE — Assessment & Plan Note (Signed)
Noted internal hemorrhoids.  This is a possible source of his hematochezia.  He does have some hemorrhoid symptoms.  At this point we will plan to wrap up GI evaluation for bleeding with a capsule endoscopy as per below.  If this is unremarkable we can focus on treatment of symptoms related to internal hemorrhoids.  We can trial Anusol suppositories.  He may be a candidate for banding as well.  Follow-up in 3 months to discuss results and options.

## 2018-03-18 NOTE — Assessment & Plan Note (Signed)
Multifactorial anemia with a diagnosis of multiple myeloma currently undergoing treatment.  His hemoglobin is somewhat lower at 8.5 a week ago compared to previous month or 2.  He does have a repeat CBC scheduled by oncology in 3 weeks.  No need to check before then.  I have given him precautions related to symptomatic anemia and requested that he call us or oncology if he starts having these including dizziness, lightheadedness, shortness of breath, weakness, fatigue, etc.  Follow-up in 3 months.

## 2018-03-18 NOTE — Progress Notes (Signed)
Referring Provider: Asencion Noble, MD Primary Care Physician:  Asencion Noble, MD Primary GI:  Dr. Gala Romney  Chief Complaint  Patient presents with  . Follow-up    still passes a little blood occasionally    HPI:   Tom Marshall is a 67 y.o. male who presents for follow-up on GI bleed and anemia.  The patient was last seen in our office 12/18/2017 for hematochezia and anemia.  Referred from oncology.  Previous colonoscopy 2012 and recommended repeat in 10 years.  At the time of his last visit the patient was on treatment for multiple myeloma not in remission, dramatic anemia with a hemoglobin of 7.6 and thrombocytopenia which necessitating holding 1 of his oncology medications.  Admitted occasional hematochezia.  Hemoglobin was 5.5 at a previous office visit with noted decrease in energy as well as fatigue and shortness of breath.  Bright red blood per rectum 2-3 times with visible blood on the stool no hemorrhoid symptoms.  At his last visit he stated he saw [facility] couple weeks ago for consideration of bone marrow transplant but awaiting colonoscopy before they proceed.  History of kidney cancer with one remaining kidney.  Rectal bleeding for the past month, about once a week, on the paper and stool.  Energy has improved.  Getting a unit of blood tomorrow.  No other GI symptoms.  Colonoscopy completed 01/16/2018 which found diverticulosis in the sigmoid colon and descending colon, nonbleeding internal hemorrhoids, otherwise normal.  EGD completed the same day found normal esophagus, normal stomach, normal duodenal bulb.  Consider small bowel video capsule to complete evaluation of GI bleed.  Recommended continue current medications and diet, follow-up in 10 years for screening colonoscopy.  No repeat EGD necessary.  Today he states he's doing well overall. Was at [facility] recently for extensive workup and planning to harvest stem cells first week of June. No plans for bone marrow transplant at this time.  Kidney function at about 24% per nephrology. Still with some intermittent hematochezia, occurs about a couple times a week. Mostly on the toilet tissue, some on the stool as well. Does have some hemorrhoid symptoms intermittently. Denies abdominal pain, N/V, melena, fever, chills, unintentional weight loss. Denies chest pain, dyspnea, dizziness, lightheadedness, syncope, near syncope. Denies any other upper or lower GI symptoms.  Past Medical History:  Diagnosis Date  . Cancer ([place])    multiple myeloma  . Cancer of right kidney ([place])   . Cervical dystonia   . Diabetes mellitus without complication [facility])     Past Surgical History:  Procedure Laterality Date  . BONE MARROW BIOPSY    . CHOLECYSTECTOMY  2007  . COLONOSCOPY WITH PROPOFOL N/A 01/16/2018   Procedure: COLONOSCOPY WITH PROPOFOL;  Surgeon: Daneil Dolin, MD;  [facility];  Service: Endoscopy;  Laterality: N/A;  1:45pm  . ESOPHAGOGASTRODUODENOSCOPY (EGD) WITH PROPOFOL N/A 01/16/2018   Procedure: ESOPHAGOGASTRODUODENOSCOPY (EGD) WITH PROPOFOL;  Surgeon: Daneil Dolin, MD;  [facility];  Service: Endoscopy;  Laterality: N/A;  . NEPHRECTOMY Right 1998   cancer    Current Outpatient Medications  Medication Sig Dispense Refill  . acyclovir (ZOVIRAX) 400 MG tablet Take 1 tablet (400 mg total) by mouth 2 (two) times daily. 60 tablet 3  . allopurinol (ZYLOPRIM) 300 MG tablet Take 300 mg by mouth every morning.    [name] aspirin EC 81 MG tablet Take 81 mg daily by mouth.    . cyclobenzaprine (FLEXERIL) 10 MG tablet Take 10  mg by mouth at bedtime.    . furosemide (LASIX) 40 MG tablet Take 40 mg by mouth every Monday, Wednesday, and Friday.     . gabapentin (NEURONTIN) 300 MG capsule Take 1 capsule (300 mg total) by mouth at bedtime. 60 capsule 4  . glipiZIDE (GLIPIZIDE XL) 2.5 MG 24 hr tablet Take 1 tablet (2.5 mg total) by mouth daily with breakfast. 30 tablet 0  . Multiple Vitamins-Minerals (CENTRUM SILVER 50+MEN)  TABS Take 1 tablet by mouth every morning.    . pravastatin (PRAVACHOL) 20 MG tablet Take 20 mg by mouth at bedtime.     . sodium bicarbonate 650 MG tablet Take 1 tablet (650 mg total) by mouth 2 (two) times daily. (Patient taking differently: Take 650 mg by mouth 3 (three) times daily. ) 60 tablet 1  . dexamethasone (DECADRON) 4 MG tablet Take 10 tablets (40 mg) on days 1 and 8 of chemo (velcade). Repeat every 21 days. (Patient not taking: Reported on 03/18/2018) 30 tablet 3  . lenalidomide (REVLIMID) 10 MG capsule Take 1 capsule (10 mg) days 1-14. Then 7 days off.  Every 21 days. (Patient not taking: Reported on 03/18/2018) 14 capsule 0  . traMADol (ULTRAM) 50 MG tablet Take 1 tablet (50 mg total) every 6 (six) hours as needed by mouth. (Patient not taking: Reported on 03/18/2018) 30 tablet 0   No current facility-administered medications for this visit.    Facility-Administered Medications Ordered in Other Visits  Medication Dose Route Frequency Provider Last Rate Last Dose  . 0.9 %  sodium chloride infusion  250 mL Intravenous Once Twana First, MD        Allergies as of 03/18/2018  . (No Known Allergies)    Family History  Problem Relation Age of Onset  . Heart failure Mother 2  . Dementia Father   . Colon cancer Neg Hx     Social History   Socioeconomic History  . Marital status: Single    Spouse name: Not on file  . Number of children: Not on file  . Years of education: Not on file  . Highest education level: Not on file  Occupational History  . Occupation: Marine scientist, Medical laboratory scientific officer Needs  . Financial resource strain: Not on file  . Food insecurity:    Worry: Not on file    Inability: Not on file  . Transportation needs:    Medical: Not on file    Non-medical: Not on file  Tobacco Use  . Smoking status: Never Smoker  . Smokeless tobacco: Former Systems developer    Types: Chew  Substance and Sexual Activity  . Alcohol use: No  . Drug use: No  . Sexual activity: Not  Currently  Lifestyle  . Physical activity:    Days per week: Not on file    Minutes per session: Not on file  . Stress: Not on file  Relationships  . Social connections:    Talks on phone: Not on file    Gets together: Not on file    Attends religious service: Not on file    Active member of club or organization: Not on file    Attends meetings of clubs or organizations: Not on file    Relationship status: Not on file  Other Topics Concern  . Not on file  Social History Narrative  . Not on file    Review of Systems: General: Negative for anorexia, weight loss, fever, chills, fatigue, weakness. ENT: Negative for hoarseness,  difficulty swallowing , nasal congestion. CV: Negative for chest pain, angina, palpitations, dyspnea on exertion, peripheral edema.  Respiratory: Negative for dyspnea at rest, dyspnea on exertion, cough, sputum, wheezing.  GI: See history of present illness. Endo: Negative for unusual weight change.  Heme: Negative for bruising or bleeding.   Physical Exam: BP (!) 154/78   Pulse 98   Temp (!) 97 F (36.1 C) (Oral)   Ht '5\' 6"'  (1.676 m)   Wt 226 lb 3.2 oz (102.6 kg)   BMI 36.51 kg/m  General:   Alert and oriented. Pleasant and cooperative. Well-nourished and well-developed.  Eyes:  Without icterus, sclera clear and conjunctiva pink.  Ears:  Normal auditory acuity. Cardiovascular:  S1, S2 present without murmurs appreciated. Extremities without clubbing or edema. Respiratory:  Clear to auscultation bilaterally. No wheezes, rales, or rhonchi. No distress.  Gastrointestinal:  +BS, soft, non-tender and non-distended. No HSM noted. No guarding or rebound. No masses appreciated.  Rectal:  Deferred  Musculoskalatal:  Symmetrical without gross deformities. Neurologic:  Alert and oriented x4;  grossly normal neurologically. Psych:  Alert and cooperative. Normal mood and affect. Heme/Lymph/Immune: No excessive bruising noted.    03/18/2018 10:13  AM   Disclaimer: This note was dictated with voice recognition software. Similar sounding words can inadvertently be transcribed and may not be corrected upon review.

## 2018-03-19 ENCOUNTER — Other Ambulatory Visit: Payer: Self-pay | Admitting: *Deleted

## 2018-03-19 ENCOUNTER — Telehealth: Payer: Self-pay | Admitting: Internal Medicine

## 2018-03-19 DIAGNOSIS — D649 Anemia, unspecified: Secondary | ICD-10-CM

## 2018-03-19 NOTE — Telephone Encounter (Signed)
Spoke w/ Tom Marshall and we can do 05/12/18 for givens cap. Spoke with patient and he is scheduled for givens on 05/12/18 at 7:30am. Instructions mailed. Orders placed.

## 2018-03-19 NOTE — Telephone Encounter (Signed)
[id number] PATIENT RETURNED CALL

## 2018-03-27 DIAGNOSIS — F17220 Nicotine dependence, chewing tobacco, uncomplicated: Secondary | ICD-10-CM | POA: Diagnosis not present

## 2018-03-27 DIAGNOSIS — C9000 Multiple myeloma not having achieved remission: Secondary | ICD-10-CM | POA: Diagnosis not present

## 2018-03-27 DIAGNOSIS — D649 Anemia, unspecified: Secondary | ICD-10-CM | POA: Diagnosis not present

## 2018-03-27 DIAGNOSIS — K922 Gastrointestinal hemorrhage, unspecified: Secondary | ICD-10-CM | POA: Diagnosis not present

## 2018-03-31 DIAGNOSIS — C9000 Multiple myeloma not having achieved remission: Secondary | ICD-10-CM | POA: Diagnosis not present

## 2018-04-01 DIAGNOSIS — C9000 Multiple myeloma not having achieved remission: Secondary | ICD-10-CM | POA: Diagnosis not present

## 2018-04-01 DIAGNOSIS — Z6710 Type A blood, Rh positive: Secondary | ICD-10-CM | POA: Diagnosis not present

## 2018-04-02 DIAGNOSIS — C9000 Multiple myeloma not having achieved remission: Secondary | ICD-10-CM | POA: Diagnosis not present

## 2018-04-16 ENCOUNTER — Inpatient Hospital Stay (HOSPITAL_COMMUNITY): Payer: Medicare Other | Attending: Hematology

## 2018-04-16 ENCOUNTER — Ambulatory Visit (HOSPITAL_COMMUNITY): Payer: Medicare Other | Admitting: Internal Medicine

## 2018-04-16 DIAGNOSIS — C9000 Multiple myeloma not having achieved remission: Secondary | ICD-10-CM | POA: Insufficient documentation

## 2018-04-16 DIAGNOSIS — G629 Polyneuropathy, unspecified: Secondary | ICD-10-CM

## 2018-04-16 LAB — CBC WITH DIFFERENTIAL/PLATELET
BASOS PCT: 0 %
Basophils Absolute: 0 10*3/uL (ref 0.0–0.1)
EOS ABS: 0.1 10*3/uL (ref 0.0–0.7)
Eosinophils Relative: 1 %
HCT: 28.5 % — ABNORMAL LOW (ref 39.0–52.0)
HEMOGLOBIN: 9.6 g/dL — AB (ref 13.0–17.0)
LYMPHS ABS: 1 10*3/uL (ref 0.7–4.0)
Lymphocytes Relative: 17 %
MCH: 33.8 pg (ref 26.0–34.0)
MCHC: 33.7 g/dL (ref 30.0–36.0)
MCV: 100.4 fL — ABNORMAL HIGH (ref 78.0–100.0)
Monocytes Absolute: 0.4 10*3/uL (ref 0.1–1.0)
Monocytes Relative: 7 %
NEUTROS ABS: 4.5 10*3/uL (ref 1.7–7.7)
NEUTROS PCT: 75 %
Platelets: 82 10*3/uL — ABNORMAL LOW (ref 150–400)
RBC: 2.84 MIL/uL — AB (ref 4.22–5.81)
RDW: 15.9 % — ABNORMAL HIGH (ref 11.5–15.5)
WBC: 5.9 10*3/uL (ref 4.0–10.5)

## 2018-04-16 LAB — LACTATE DEHYDROGENASE: LDH: 106 U/L (ref 98–192)

## 2018-04-16 LAB — COMPREHENSIVE METABOLIC PANEL
ALBUMIN: 4.2 g/dL (ref 3.5–5.0)
ALK PHOS: 201 U/L — AB (ref 38–126)
ALT: 28 U/L (ref 17–63)
AST: 20 U/L (ref 15–41)
Anion gap: 12 (ref 5–15)
BUN: 48 mg/dL — ABNORMAL HIGH (ref 6–20)
CALCIUM: 9.4 mg/dL (ref 8.9–10.3)
CO2: 26 mmol/L (ref 22–32)
CREATININE: 2.82 mg/dL — AB (ref 0.61–1.24)
Chloride: 101 mmol/L (ref 101–111)
GFR calc Af Amer: 25 mL/min — ABNORMAL LOW (ref >60)
GFR calc non Af Amer: 22 mL/min — ABNORMAL LOW (ref >60)
GLUCOSE: 252 mg/dL — AB (ref 65–99)
Potassium: 4.6 mmol/L (ref 3.5–5.1)
Sodium: 139 mmol/L (ref 135–145)
Total Bilirubin: 0.5 mg/dL (ref 0.3–1.2)
Total Protein: 6.9 g/dL (ref 6.5–8.1)

## 2018-04-16 LAB — SAMPLE TO BLOOD BANK

## 2018-04-17 LAB — PROTEIN ELECTROPHORESIS, SERUM
A/G RATIO SPE: 1.7 (ref 0.7–1.7)
ALBUMIN ELP: 3.8 g/dL (ref 2.9–4.4)
ALPHA-2-GLOBULIN: 0.8 g/dL (ref 0.4–1.0)
Alpha-1-Globulin: 0.2 g/dL (ref 0.0–0.4)
BETA GLOBULIN: 0.9 g/dL (ref 0.7–1.3)
GAMMA GLOBULIN: 0.4 g/dL (ref 0.4–1.8)
Globulin, Total: 2.3 g/dL (ref 2.2–3.9)
M-Spike, %: 0.1 g/dL — ABNORMAL HIGH
Total Protein ELP: 6.1 g/dL (ref 6.0–8.5)

## 2018-04-17 LAB — BETA 2 MICROGLOBULIN, SERUM: BETA 2 MICROGLOBULIN: 7.7 mg/L — AB (ref 0.6–2.4)

## 2018-04-17 LAB — IGG, IGA, IGM
IGM (IMMUNOGLOBULIN M), SRM: 46 mg/dL (ref 20–172)
IgA: 120 mg/dL (ref 61–437)
IgG (Immunoglobin G), Serum: 345 mg/dL — ABNORMAL LOW (ref 700–1600)

## 2018-04-17 LAB — KAPPA/LAMBDA LIGHT CHAINS
KAPPA FREE LGHT CHN: 30.5 mg/L — AB (ref 3.3–19.4)
KAPPA, LAMDA LIGHT CHAIN RATIO: 1.5 (ref 0.26–1.65)
LAMDA FREE LIGHT CHAINS: 20.4 mg/L (ref 5.7–26.3)

## 2018-04-24 DIAGNOSIS — C9000 Multiple myeloma not having achieved remission: Secondary | ICD-10-CM | POA: Diagnosis not present

## 2018-05-06 ENCOUNTER — Encounter (HOSPITAL_COMMUNITY): Payer: Self-pay | Admitting: Internal Medicine

## 2018-05-06 ENCOUNTER — Inpatient Hospital Stay (HOSPITAL_COMMUNITY): Payer: Medicare Other | Attending: Internal Medicine | Admitting: Internal Medicine

## 2018-05-06 VITALS — BP 154/68 | HR 103 | Temp 98.3°F | Resp 16 | Wt 229.4 lb

## 2018-05-06 DIAGNOSIS — C9000 Multiple myeloma not having achieved remission: Secondary | ICD-10-CM

## 2018-05-06 NOTE — Progress Notes (Signed)
Diagnosis Multiple myeloma not having achieved remission ([place]) - Plan: CBC with Differential/Platelet, Comprehensive metabolic panel, Lactate dehydrogenase, Protein electrophoresis, serum, Ferritin, IgG, IgA, IgM, Beta 2 microglobulin, serum, Kappa/lambda light chains  Staging Cancer Staging No matching staging information was found for the patient.  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  He was being treated with Revlimid, Velcade and low-dose Decadron under the direction of Dr. Lebron Conners.  He underwent repeat bone marrow biopsy on Feb 27, 2018 at [facility] that showed no diagnostic or definitive evidence of residual plasma cell neoplasm with < 1% plasma cells.  He has been seen at [facility] on Mar 10, 2018 for follow-up.  Velcade remains on  hold.  He reports he is no longer on Revlimid per [facility] recommendations.    Labs done 04/16/2018 reviewed with pt and shows WBC 5.9 HB 9.6 plts 82,000.  Cr 2.82, Ca++ 9.4 SPEP 0.1 g/dl.  Pt has a small m-spike compared to previous labs done 02/2018.   Pt is reportedly scheduled to be seen at [facility] later this month.  Will follow-up with them regarding recommendations.  He will RTC in 4 weeks for repeat labs.     2.  Anemia.  HB 9.6.  Will repeat labs in 4 weeks.    3.  RI.  Patient reports he had a history of right kidney cancer and had right nephrectomy performed.  Labs done 04/16/2018 show Cr 2.82.  Revlimid on hold.  Will repeat chemistries in 4 weeks.    4.  Thrombocytopenia.  Platelet count is decreased at 82,000 on labs done 04/16/2018.  Will repeat labs in 4 weeks.   5.  Right kidney cancer.  Pt underwent right nephrectomy in 1998.  Will continue to monitor renal function.    6.  Neuropathy.  Pt remains on Neurontin.  Velcade remains on hold.  Will determine if symptoms improve with holding Velcade.     Interval history: Historical data obtained from note dated 03/17/2018.   67 y.o. male with diagnosis of multiple myeloma IgA kappa.  He is being treated with  Revlimid, bortezomib, and low-dose dexamethasone under the direction of Dr. Lebron Conners.  Previous cycle of therapy complicated by grade 3 anemia necessitating holding of bortezomib per Dr. Lebron Conners.    Current status: The patient is seen today for follow-up.  He reports he is scheduled to be seen at [facility] later this month.  He is no longer taking Revlimid.    Multiple myeloma ([place])   08/29/2017 Initial Diagnosis    Multiple myeloma ([place])      12/02/2017 -  Chemotherapy    The patient had bortezomib SQ (VELCADE) chemo injection 2.75 mg, 1.3 mg/m2 = 2.75 mg, Subcutaneous,  Once, 5 of 5 cycles Administration: 2.75 mg (12/10/2017), 2.75 mg (12/17/2017), 2.75 mg (12/31/2017), 2.75 mg (01/21/2018), 2.75 mg (02/18/2018), 2.75 mg (03/11/2018)  for chemotherapy treatment.         Problem List Patient Active Problem List   Diagnosis Date Noted  . Internal hemorrhoids [K64.8] 03/18/2018  . Hematochezia [K92.1] 12/18/2017  . Multiple myeloma ([place]) [C90.00] 08/29/2017  . CKD (chronic kidney disease) [N18.9] 08/29/2017  . Symptomatic anemia [D64.9]   . 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 AKI (acute kidney injury) ([place]) [N17.9] 07/24/2017  . Closed fracture of transverse process of lumbar vertebra, with delayed healing, subsequent encounter [S32.009G] 07/24/2017  . Renal failure [N19] 07/23/2017  . Hypercalcemia [E83.52] 07/23/2017  . Anemia [D64.9] 07/23/2017  . Thrombocytopenia ([place]) [D69.6]  07/23/2017  . Diabetes mellitus type 2 in obese ([place]) [E11.69, E66.9] 07/23/2017    Past Medical History Past Medical History:  Diagnosis Date  . Cancer ([place])    multiple myeloma  . Cancer of right kidney ([place])   . Cervical dystonia   . Diabetes mellitus without complication [facility])     Past Surgical History Past Surgical History:  Procedure Laterality Date  . BONE MARROW BIOPSY    . CHOLECYSTECTOMY  2007  . COLONOSCOPY WITH PROPOFOL N/A 01/16/2018   Procedure: COLONOSCOPY WITH PROPOFOL;  Surgeon:  Daneil Dolin, MD;  [facility];  Service: Endoscopy;  Laterality: N/A;  1:45pm  . ESOPHAGOGASTRODUODENOSCOPY (EGD) WITH PROPOFOL N/A 01/16/2018   Procedure: ESOPHAGOGASTRODUODENOSCOPY (EGD) WITH PROPOFOL;  Surgeon: Daneil Dolin, MD;  [facility];  Service: Endoscopy;  Laterality: N/A;  . NEPHRECTOMY Right 1998   cancer    Family History Family History  Problem Relation Age of Onset  . Heart failure Mother 101  . Dementia Father   . Colon cancer Neg Hx      Social History  reports that he has never smoked. He quit smokeless tobacco use about 11 months ago. His smokeless tobacco use included chew. He reports that he does not drink alcohol or use drugs.  Medications  Current Outpatient Medications:  .  acyclovir (ZOVIRAX) 400 MG tablet, Take 1 tablet (400 mg total) by mouth 2 (two) times daily., Disp: 60 tablet, Rfl: 3 .  allopurinol (ZYLOPRIM) 300 MG tablet, Take 300 mg by mouth every morning., Disp: , Rfl:  .  aspirin EC 81 MG tablet, Take 81 mg daily by mouth., Disp: , Rfl:  .  cyclobenzaprine (FLEXERIL) 10 MG tablet, Take 10 mg by mouth at bedtime., Disp: , Rfl:  .  furosemide (LASIX) 40 MG tablet, Take 40 mg by mouth every Monday, Wednesday, and Friday. , Disp: , Rfl:  .  gabapentin (NEURONTIN) 300 MG capsule, Take 1 capsule (300 mg total) by mouth at bedtime., Disp: 60 capsule, Rfl: 4 .  glipiZIDE (GLIPIZIDE XL) 2.5 MG 24 hr tablet, Take 1 tablet (2.5 mg total) by mouth daily with breakfast., Disp: 30 tablet, Rfl: 0 .  Multiple Vitamins-Minerals (CENTRUM SILVER 50+MEN) TABS, Take 1 tablet by mouth every morning., Disp: , Rfl:  .  pravastatin (PRAVACHOL) 20 MG tablet, Take 20 mg by mouth at bedtime. , Disp: , Rfl:  .  sodium bicarbonate 650 MG tablet, Take 1 tablet (650 mg total) by mouth 2 (two) times daily. (Patient taking differently: Take 650 mg by mouth 3 (three) times daily. ), Disp: 60 tablet, Rfl: 1 .  traMADol (ULTRAM) 50 MG tablet, Take 1 tablet  (50 mg total) every 6 (six) hours as needed by mouth., Disp: 30 tablet, Rfl: 0 .  dexamethasone (DECADRON) 4 MG tablet, Take 10 tablets (40 mg) on days 1 and 8 of chemo (velcade). Repeat every 21 days. (Patient not taking: Reported on 03/18/2018), Disp: 30 tablet, Rfl: 3 .  lenalidomide (REVLIMID) 10 MG capsule, Take 1 capsule (10 mg) days 1-14. Then 7 days off.  Every 21 days. (Patient not taking: Reported on 03/18/2018), Disp: 14 capsule, Rfl: 0 No current facility-administered medications for this visit.   Facility-Administered Medications Ordered in Other Visits:  .  0.9 %  sodium chloride infusion, 250 mL, Intravenous, Once, Twana First, MD  Allergies Patient has no known allergies.  Review of Systems Review of Systems - Oncology ROS negative other than neuropathy  Physical Exam  Vitals Wt Readings from Last 3 Encounters:  05/06/18 229 lb 6.4 oz (104.1 kg)  03/18/18 226 lb 3.2 oz (102.6 kg)  03/17/18 223 lb 14.4 oz (101.6 kg)   Temp Readings from Last 3 Encounters:  05/06/18 98.3 F (36.8 C) (Oral)  03/18/18 (!) 97 F (36.1 C) (Oral)  03/17/18 98.1 F (36.7 C) (Oral)   BP Readings from Last 3 Encounters:  05/06/18 (!) 154/68  03/18/18 (!) 154/78  03/17/18 (!) 165/75   Pulse Readings from Last 3 Encounters:  05/06/18 (!) 103  03/18/18 98  03/17/18 90    Constitutional: Well-developed, well-nourished, and in no distress.   HENT: Head: Normocephalic and atraumatic.  Mouth/Throat: No oropharyngeal exudate. Mucosa moist. Eyes: Pupils are equal, round, and reactive to light. Conjunctivae are normal. No scleral icterus.  Neck: Normal range of motion. Neck supple. No JVD present.  Cardiovascular: Normal rate, regular rhythm and normal heart sounds.  Exam reveals no gallop and no friction rub.   No murmur heard. Pulmonary/Chest: Effort normal and breath sounds normal. No respiratory distress. No wheezes.No rales.  Abdominal: Soft. Bowel sounds are normal. No distension.  There is no tenderness. There is no guarding.  Musculoskeletal: No edema or tenderness.  Lymphadenopathy: No cervical, axillary or supraclavicular adenopathy.  Neurological: Alert and oriented to person, place, and time. No cranial nerve deficit.  Skin: Skin is warm and dry. No rash noted. No erythema. No pallor.  Psychiatric: Affect and judgment normal.   Labs No visits with results within 3 Day(s) from this visit.  Latest known visit with results is:  Appointment on 04/16/2018  Component Date Value Ref Range Status  . Blood Bank Specimen 04/16/2018 SAMPLE AVAILABLE FOR TESTING   Final  . Sample Expiration 04/16/2018    Final                   Value:04/19/2018 Performed at [facility], [address], [place], [place] [address]   . WBC 04/16/2018 5.9  4.0 - 10.5 K/uL Final  . RBC 04/16/2018 2.84* 4.22 - 5.81 MIL/uL Final  . Hemoglobin 04/16/2018 9.6* 13.0 - 17.0 g/dL Final  . HCT 04/16/2018 28.5* 39.0 - 52.0 % Final  . MCV 04/16/2018 100.4* 78.0 - 100.0 fL Final  . MCH 04/16/2018 33.8  26.0 - 34.0 pg Final  . MCHC 04/16/2018 33.7  30.0 - 36.0 g/dL Final  . RDW 04/16/2018 15.9* 11.5 - 15.5 % Final  . Platelets 04/16/2018 82* 150 - 400 K/uL Final   Comment: SPECIMEN CHECKED FOR CLOTS PLATELET COUNT CONFIRMED BY SMEAR   . Neutrophils Relative % 04/16/2018 75  % Final  . Neutro Abs 04/16/2018 4.5  1.7 - 7.7 K/uL Final  . Lymphocytes Relative 04/16/2018 17  % Final  . Lymphs Abs 04/16/2018 1.0  0.7 - 4.0 K/uL Final  . Monocytes Relative 04/16/2018 7  % Final  . Monocytes Absolute 04/16/2018 0.4  0.1 - 1.0 K/uL Final  . Eosinophils Relative 04/16/2018 1  % Final  . Eosinophils Absolute 04/16/2018 0.1  0.0 - 0.7 K/uL Final  . Basophils Relative 04/16/2018 0  % Final  . Basophils Absolute 04/16/2018 0.0  0.0 - 0.1 K/uL Final   Performed at [facility], [address] [place], [place] [address]  . Sodium 04/16/2018 139  135 - 145 mmol/L Final  . Potassium 04/16/2018 4.6  3.5 - 5.1  mmol/L Final  . Chloride 04/16/2018 101  101 - 111 mmol/L Final  .  CO2 04/16/2018 26  22 - 32 mmol/L Final  . Glucose, Bld 04/16/2018 252* 65 - 99 mg/dL Final  . BUN 04/16/2018 48* 6 - 20 mg/dL Final  . Creatinine, Ser 04/16/2018 2.82* 0.61 - 1.24 mg/dL Final  . Calcium 04/16/2018 9.4  8.9 - 10.3 mg/dL Final  . Total Protein 04/16/2018 6.9  6.5 - 8.1 g/dL Final  . Albumin 04/16/2018 4.2  3.5 - 5.0 g/dL Final  . AST 04/16/2018 20  15 - 41 U/L Final  . ALT 04/16/2018 28  17 - 63 U/L Final  . Alkaline Phosphatase 04/16/2018 201* 38 - 126 U/L Final  . Total Bilirubin 04/16/2018 0.5  0.3 - 1.2 mg/dL Final  . GFR calc non Af Amer 04/16/2018 22* >60 mL/min Final  . GFR calc Af Amer 04/16/2018 25* >60 mL/min Final   Comment: (NOTE) The eGFR has been calculated using the CKD EPI equation. This calculation has not been validated in all clinical situations. eGFR's persistently <60 mL/min signify possible Chronic Kidney Disease.   Georgiann Hahn gap 04/16/2018 12  5 - 15 Final   Performed at [facility], [address], [place], [place] [address]  . LDH 04/16/2018 106  98 - 192 U/L Final   Performed at [facility], [address] [place], [place] [address]  . Total Protein ELP 04/16/2018 6.1  6.0 - 8.5 g/dL Final  . Albumin ELP 04/16/2018 3.8  2.9 - 4.4 g/dL Final  . Alpha-1-Globulin 04/16/2018 0.2  0.0 - 0.4 g/dL Final  . Alpha-2-Globulin 04/16/2018 0.8  0.4 - 1.0 g/dL Final  . Beta Globulin 04/16/2018 0.9  0.7 - 1.3 g/dL Final  . Gamma Globulin 04/16/2018 0.4  0.4 - 1.8 g/dL Final  . M-Spike, % 04/16/2018 0.1* Not Observed g/dL Final  . SPE Interp. 04/16/2018 Comment   Final   Comment: (NOTE) Faint band in gamma region suspicious for monoclonal immunoglobulin. Performed At: [facility] [place], [place] [id number] Rush Farmer MD [id number]   . Comment 04/16/2018 Comment   Final   Comment: (NOTE) Protein electrophoresis scan will follow via computer, mail,  or courier delivery.   [name] GLOBULIN, TOTAL 04/16/2018 2.3  2.2 - 3.9 g/dL Corrected  . A/G Ratio 04/16/2018 1.7  0.7 - 1.7 Corrected   Performed at [facility], [address] [place], [place] [address]  . IgG (Immunoglobin G), Serum 04/16/2018 345* 700 - 1,600 mg/dL Final  . IgA 04/16/2018 120  61 - 437 mg/dL Final  . IgM (Immunoglobulin M), Srm 04/16/2018 46  20 - 172 mg/dL Final   Comment: (NOTE) Performed At: [facility] [place], [place] [id number] Rush Farmer MD [id number] Performed at [facility], [address], [place], [place] [address]   . Kappa free light chain 04/16/2018 30.5* 3.3 - 19.4 mg/L Final  . Lamda free light chains 04/16/2018 20.4  5.7 - 26.3 mg/L Final  . Kappa, lamda light chain ratio 04/16/2018 1.50  0.26 - 1.65 Final   Comment: (NOTE) Performed At: [facility] [place], [place] [id number] Rush Farmer MD [id number] Performed at [facility], [address], [place], [place] [address]   . Beta-2 Microglobulin 04/16/2018 7.7* 0.6 - 2.4 mg/L Final   Comment: (NOTE) Siemens Immulite 2000 Immunochemiluminometric assay (ICMA) Values obtained with different assay methods or kits cannot be used interchangeably. Results cannot be interpreted as absolute evidence of the presence or absence of malignant disease. Performed At: [facility] [id number]  [place] [id number] Rush Farmer MD [id number] Performed at [facility], [address] [place], [place] [address]      Pathology Orders Placed This Encounter  Procedures  . CBC with Differential/Platelet    Standing Status:   Future    Standing Expiration Date:   05/07/2019  . Comprehensive metabolic panel    Standing Status:   Future    Standing Expiration Date:   05/07/2019  . Lactate dehydrogenase    Standing Status:   Future    Standing Expiration Date:   05/07/2019  . Protein electrophoresis, serum    Standing Status:   Future     Standing Expiration Date:   05/07/2019  . Ferritin    Standing Status:   Future    Standing Expiration Date:   05/07/2019  . IgG, IgA, IgM    Standing Status:   Future    Standing Expiration Date:   05/07/2019  . Beta 2 microglobulin, serum    Standing Status:   Future    Standing Expiration Date:   05/07/2019  . Kappa/lambda light chains    Standing Status:   Future    Standing Expiration Date:   05/07/2019       Zoila Shutter MD

## 2018-05-06 NOTE — Patient Instructions (Signed)
[facility] at [facility] Discharge Instructions  You saw Dr. Higgs today.   Thank you for choosing [facility] at [facility] to provide your oncology and hematology care.  To afford each patient quality time with our provider, please arrive at least 15 minutes before your scheduled appointment time.   If you have a lab appointment with the Cancer Center please come in thru the  Main Entrance and check in at the main information desk  You need to re-schedule your appointment should you arrive 10 or more minutes late.  We strive to give you quality time with our providers, and arriving late affects you and other patients whose appointments are after yours.  Also, if you no show three or more times for appointments you may be dismissed from the clinic at the providers discretion.     Again, thank you for choosing [facility].  Our hope is that these requests will decrease the amount of time that you wait before being seen by our physicians.       _____________________________________________________________  Should you have questions after your visit to [facility], please contact our office at [phone] between the hours of 8:30 a.m. and 4:30 p.m.  Voicemails left after 4:30 p.m. will not be returned until the following business day.  For prescription refill requests, have your pharmacy contact our office.       Resources For Cancer Patients and their Caregivers ? American Cancer Society: Can assist with transportation, wigs, general needs, runs Look Good Feel Better.        1-888-227-6333 ? Cancer Care: Provides financial assistance, online support groups, medication/co-pay assistance.  1-800-813-HOPE (4673) ? [facility] Assists [place] cancer patients and their families through emotional , educational and financial support.  336-427-4357 ? [place] DSS Where to apply for food  stamps, Medicaid and utility assistance. 336-342-1394 ? [facility]: Transportation to medical appointments. 336-347-2287 ? Social Security Administration: May apply for disability if have a Stage IV cancer. 336-342-7796 1-800-772-1213 ? [place] Aging, Disability and Transit Services: Assists with nutrition, care and transit needs. 336-349-2343  Cancer Center Support Programs:   > Cancer Support Group  2nd Tuesday of the month 1pm-2pm, Journey Room   > Creative Journey  3rd Tuesday of the month 1130am-1pm, Journey Room     

## 2018-05-12 ENCOUNTER — Ambulatory Visit (HOSPITAL_COMMUNITY)
Admission: RE | Admit: 2018-05-12 | Discharge: 2018-05-12 | Disposition: A | Discharge status: Home / Self Care | Payer: Medicare Other | Source: Ambulatory Visit | Attending: Internal Medicine | Admitting: Internal Medicine

## 2018-05-12 ENCOUNTER — Encounter (HOSPITAL_COMMUNITY): Payer: Self-pay

## 2018-05-12 ENCOUNTER — Encounter (HOSPITAL_COMMUNITY)
Admission: RE | Disposition: A | Discharge status: Home / Self Care | Payer: Self-pay | Source: Ambulatory Visit | Attending: Internal Medicine

## 2018-05-12 DIAGNOSIS — D6489 Other specified anemias: Secondary | ICD-10-CM

## 2018-05-12 DIAGNOSIS — C9000 Multiple myeloma not having achieved remission: Secondary | ICD-10-CM | POA: Diagnosis not present

## 2018-05-12 DIAGNOSIS — D649 Anemia, unspecified: Secondary | ICD-10-CM | POA: Diagnosis present

## 2018-05-12 DIAGNOSIS — N189 Chronic kidney disease, unspecified: Secondary | ICD-10-CM | POA: Insufficient documentation

## 2018-05-12 DIAGNOSIS — D631 Anemia in chronic kidney disease: Secondary | ICD-10-CM | POA: Insufficient documentation

## 2018-05-12 DIAGNOSIS — K921 Melena: Secondary | ICD-10-CM | POA: Insufficient documentation

## 2018-05-12 HISTORY — PX: GIVENS CAPSULE STUDY: SHX5432

## 2018-05-12 SURGERY — IMAGING PROCEDURE, GI TRACT, INTRALUMINAL, VIA CAPSULE

## 2018-05-13 ENCOUNTER — Encounter (HOSPITAL_COMMUNITY): Payer: Self-pay | Admitting: Internal Medicine

## 2018-05-15 DIAGNOSIS — D649 Anemia, unspecified: Secondary | ICD-10-CM | POA: Diagnosis not present

## 2018-05-15 DIAGNOSIS — N184 Chronic kidney disease, stage 4 (severe): Secondary | ICD-10-CM | POA: Diagnosis not present

## 2018-05-15 DIAGNOSIS — E785 Hyperlipidemia, unspecified: Secondary | ICD-10-CM | POA: Diagnosis not present

## 2018-05-15 DIAGNOSIS — Z125 Encounter for screening for malignant neoplasm of prostate: Secondary | ICD-10-CM | POA: Diagnosis not present

## 2018-05-15 DIAGNOSIS — E1129 Type 2 diabetes mellitus with other diabetic kidney complication: Secondary | ICD-10-CM | POA: Diagnosis not present

## 2018-05-15 DIAGNOSIS — C9000 Multiple myeloma not having achieved remission: Secondary | ICD-10-CM | POA: Diagnosis not present

## 2018-05-15 DIAGNOSIS — Z79899 Other long term (current) drug therapy: Secondary | ICD-10-CM | POA: Diagnosis not present

## 2018-05-15 NOTE — Procedures (Addendum)
Small Bowel [place] Capsule Study Procedure date:  05/12/18  Referring Provider:  Walden Field, DNP/Dr. Gala Romney  PCP:  Dr. Asencion Noble, MD  Indication for procedure:   67 year old male with history of multiple myeloma, profound anemia in Dec 2018 with Hgb 5.6, s/p colonoscopy March 2019 with diverticulosis and non-bleeding internal hemorrhoids, EGD normal. Intermittent bright red blood per rectum that is low-volume. Due to anemia Velcade on hold. Iron has been normal, ferritin elevated. Capsule study to conclude GI evaluation for any etiology contributing to anemia.       Findings:   Capsule study was complete to the cecum. This was a markedly poor prep with debris throughout the small bowel. Any small tumor, lesion, bleeding source would be difficult to see and easily not evaluated due to significant debris. Possible gastric and scant small bowel erosions noted but still difficult to determine due to limited visibility.    First Gastric image:  00:01:41 First Duodenal image: 0:12:02 First Cecal image: 06:27:59 approximately, as debris obscured obvious landmarks Gastric Passage time: 0h 32mSmall Bowel Passage time:  6h 122mSummary & Recommendations: [age]ear old male with history of profound anemia, now improved and followed closely by Hem/Onc due to multiple myeloma not in remission. Chronic kidney disease contributing to anemia as well. Low-volume hematochezia noted recently more likely secondary to known internal hemorrhoids rather than a significant GI bleed. Colonoscopy, EGD overall unrevealing. Capsule study with poor prep and debris throughout entire study. Small lesions, tumors, bleeding sources could have easily been obscured by debris. I do note that he is on an 81 mg aspirin daily. If no cardiac history, would recommend discontinuing this.   If any further transfusion dependent anemia could consider possible repeat capsule with a modified prep including extended time on clear liquids,  low dose Reglan 30-45 minutes prior to capsule ingestion. In interim, stop aspirin if feasible.   AnAnnitta NeedsPhD, ANP-BC [facility]   Attending note:   Pertinent images reviewed. Agree with above assessment.

## 2018-05-16 ENCOUNTER — Telehealth: Payer: Self-pay | Admitting: Gastroenterology

## 2018-05-16 NOTE — Telephone Encounter (Signed)
  I reviewed capsule study. My impression as below:  67 year old male with history of profound anemia, now improved and followed closely by Hem/Onc due to multiple myeloma not in remission. Chronic kidney disease contributing to anemia as well. Low-volume hematochezia noted recently more likely secondary to known internal hemorrhoids rather than a significant GI bleed. Colonoscopy, EGD overall unrevealing. Capsule study with poor prep and debris throughout entire study. Small lesions, tumors, bleeding sources could have easily been obscured by debris. I do note that he is on an 81 mg aspirin daily. If no cardiac history, would recommend discontinuing this.   If any further transfusion dependent anemia could consider possible repeat capsule with a modified prep including extended time on clear liquids, low dose Reglan 30-45 minutes prior to capsule ingestion. In interim, stop aspirin if feasible.    Please let him know that nothing obvious was noted. Randall Hiss will be seeing him in August. I would certainly advise stopping 81 mg aspirin IF HE HAS NO CARDIAC HISTORY. IF he has a cardiologist, please review with them first.

## 2018-05-20 NOTE — Telephone Encounter (Signed)
Lmom, waiting on a return call.  

## 2018-05-21 DIAGNOSIS — M9080 Osteopathy in diseases classified elsewhere, unspecified site: Secondary | ICD-10-CM | POA: Diagnosis not present

## 2018-05-21 DIAGNOSIS — E889 Metabolic disorder, unspecified: Secondary | ICD-10-CM | POA: Diagnosis not present

## 2018-05-21 DIAGNOSIS — I10 Essential (primary) hypertension: Secondary | ICD-10-CM | POA: Diagnosis not present

## 2018-05-21 DIAGNOSIS — N184 Chronic kidney disease, stage 4 (severe): Secondary | ICD-10-CM | POA: Diagnosis not present

## 2018-05-22 DIAGNOSIS — M109 Gout, unspecified: Secondary | ICD-10-CM | POA: Diagnosis not present

## 2018-05-22 DIAGNOSIS — C9000 Multiple myeloma not having achieved remission: Secondary | ICD-10-CM | POA: Diagnosis not present

## 2018-05-22 DIAGNOSIS — Z6841 Body Mass Index (BMI) 40.0 and over, adult: Secondary | ICD-10-CM | POA: Diagnosis not present

## 2018-05-22 DIAGNOSIS — N184 Chronic kidney disease, stage 4 (severe): Secondary | ICD-10-CM | POA: Diagnosis not present

## 2018-05-22 DIAGNOSIS — E1169 Type 2 diabetes mellitus with other specified complication: Secondary | ICD-10-CM | POA: Diagnosis not present

## 2018-05-22 DIAGNOSIS — R000 Tachycardia, unspecified: Secondary | ICD-10-CM | POA: Diagnosis not present

## 2018-05-22 NOTE — Telephone Encounter (Signed)
Lmom, waiting on a return call.  

## 2018-05-29 ENCOUNTER — Inpatient Hospital Stay (HOSPITAL_COMMUNITY): Payer: Medicare Other | Attending: Hematology

## 2018-05-29 DIAGNOSIS — G629 Polyneuropathy, unspecified: Secondary | ICD-10-CM | POA: Insufficient documentation

## 2018-05-29 DIAGNOSIS — D649 Anemia, unspecified: Secondary | ICD-10-CM | POA: Insufficient documentation

## 2018-05-29 DIAGNOSIS — Z905 Acquired absence of kidney: Secondary | ICD-10-CM | POA: Insufficient documentation

## 2018-05-29 DIAGNOSIS — Z9221 Personal history of antineoplastic chemotherapy: Secondary | ICD-10-CM | POA: Insufficient documentation

## 2018-05-29 DIAGNOSIS — Z79899 Other long term (current) drug therapy: Secondary | ICD-10-CM | POA: Diagnosis not present

## 2018-05-29 DIAGNOSIS — C9000 Multiple myeloma not having achieved remission: Secondary | ICD-10-CM | POA: Insufficient documentation

## 2018-05-29 DIAGNOSIS — D696 Thrombocytopenia, unspecified: Secondary | ICD-10-CM | POA: Diagnosis not present

## 2018-05-29 DIAGNOSIS — Z85528 Personal history of other malignant neoplasm of kidney: Secondary | ICD-10-CM | POA: Insufficient documentation

## 2018-05-29 DIAGNOSIS — E1122 Type 2 diabetes mellitus with diabetic chronic kidney disease: Secondary | ICD-10-CM | POA: Insufficient documentation

## 2018-05-29 LAB — CBC WITH DIFFERENTIAL/PLATELET
BASOS ABS: 0 10*3/uL (ref 0.0–0.1)
Basophils Relative: 0 %
Eosinophils Absolute: 0.1 10*3/uL (ref 0.0–0.7)
Eosinophils Relative: 1 %
HCT: 30.3 % — ABNORMAL LOW (ref 39.0–52.0)
HEMOGLOBIN: 10.3 g/dL — AB (ref 13.0–17.0)
LYMPHS ABS: 1.2 10*3/uL (ref 0.7–4.0)
LYMPHS PCT: 17 %
MCH: 33.8 pg (ref 26.0–34.0)
MCHC: 34 g/dL (ref 30.0–36.0)
MCV: 99.3 fL (ref 78.0–100.0)
Monocytes Absolute: 0.4 10*3/uL (ref 0.1–1.0)
Monocytes Relative: 5 %
Neutro Abs: 5.6 10*3/uL (ref 1.7–7.7)
Neutrophils Relative %: 77 %
Platelets: 134 10*3/uL — ABNORMAL LOW (ref 150–400)
RBC: 3.05 MIL/uL — AB (ref 4.22–5.81)
RDW: 14.1 % (ref 11.5–15.5)
WBC: 7.3 10*3/uL (ref 4.0–10.5)

## 2018-05-29 LAB — COMPREHENSIVE METABOLIC PANEL
ALBUMIN: 4 g/dL (ref 3.5–5.0)
ALT: 28 U/L (ref 0–44)
ANION GAP: 11 (ref 5–15)
AST: 21 U/L (ref 15–41)
Alkaline Phosphatase: 184 U/L — ABNORMAL HIGH (ref 38–126)
BUN: 42 mg/dL — ABNORMAL HIGH (ref 8–23)
CHLORIDE: 99 mmol/L (ref 98–111)
CO2: 26 mmol/L (ref 22–32)
Calcium: 9.2 mg/dL (ref 8.9–10.3)
Creatinine, Ser: 2.97 mg/dL — ABNORMAL HIGH (ref 0.61–1.24)
GFR calc Af Amer: 23 mL/min — ABNORMAL LOW (ref >60)
GFR calc non Af Amer: 20 mL/min — ABNORMAL LOW (ref >60)
GLUCOSE: 320 mg/dL — AB (ref 70–99)
POTASSIUM: 4.2 mmol/L (ref 3.5–5.1)
SODIUM: 136 mmol/L (ref 135–145)
Total Bilirubin: 0.9 mg/dL (ref 0.3–1.2)
Total Protein: 6.7 g/dL (ref 6.5–8.1)

## 2018-05-29 LAB — LACTATE DEHYDROGENASE: LDH: 103 U/L (ref 98–192)

## 2018-05-29 LAB — FERRITIN: Ferritin: 812 ng/mL — ABNORMAL HIGH (ref 24–336)

## 2018-05-29 NOTE — Telephone Encounter (Signed)
Pt notified of capsule study and should d/c ASA. Pt also mentioned he is using his suppositories when his hemorrhoids flare. Pt is aware that if he has severe bleeding in his stool, he should proceed to the ED.

## 2018-05-30 LAB — PROTEIN ELECTROPHORESIS, SERUM
A/G Ratio: 1.5 (ref 0.7–1.7)
ALBUMIN ELP: 3.7 g/dL (ref 2.9–4.4)
ALPHA-1-GLOBULIN: 0.2 g/dL (ref 0.0–0.4)
ALPHA-2-GLOBULIN: 0.9 g/dL (ref 0.4–1.0)
Beta Globulin: 1 g/dL (ref 0.7–1.3)
GAMMA GLOBULIN: 0.4 g/dL (ref 0.4–1.8)
Globulin, Total: 2.5 g/dL (ref 2.2–3.9)
M-SPIKE, %: 0.1 g/dL — AB
Total Protein ELP: 6.2 g/dL (ref 6.0–8.5)

## 2018-05-30 LAB — IGG, IGA, IGM
IGA: 120 mg/dL (ref 61–437)
IGM (IMMUNOGLOBULIN M), SRM: 58 mg/dL (ref 20–172)
IgG (Immunoglobin G), Serum: 426 mg/dL — ABNORMAL LOW (ref 700–1600)

## 2018-05-30 LAB — KAPPA/LAMBDA LIGHT CHAINS
KAPPA, LAMDA LIGHT CHAIN RATIO: 2.16 — AB (ref 0.26–1.65)
Kappa free light chain: 37.2 mg/L — ABNORMAL HIGH (ref 3.3–19.4)
Lambda free light chains: 17.2 mg/L (ref 5.7–26.3)

## 2018-05-30 LAB — BETA 2 MICROGLOBULIN, SERUM: Beta-2 Microglobulin: 6.7 mg/L — ABNORMAL HIGH (ref 0.6–2.4)

## 2018-06-03 ENCOUNTER — Encounter (HOSPITAL_COMMUNITY): Payer: Self-pay | Admitting: Internal Medicine

## 2018-06-03 ENCOUNTER — Inpatient Hospital Stay (HOSPITAL_BASED_OUTPATIENT_CLINIC_OR_DEPARTMENT_OTHER): Payer: Medicare Other | Admitting: Internal Medicine

## 2018-06-03 ENCOUNTER — Other Ambulatory Visit (HOSPITAL_COMMUNITY): Payer: Self-pay | Admitting: *Deleted

## 2018-06-03 VITALS — BP 148/66 | HR 87 | Temp 98.6°F | Resp 16 | Wt 228.9 lb

## 2018-06-03 DIAGNOSIS — Z9221 Personal history of antineoplastic chemotherapy: Secondary | ICD-10-CM

## 2018-06-03 DIAGNOSIS — Z905 Acquired absence of kidney: Secondary | ICD-10-CM | POA: Diagnosis not present

## 2018-06-03 DIAGNOSIS — G629 Polyneuropathy, unspecified: Secondary | ICD-10-CM | POA: Diagnosis not present

## 2018-06-03 DIAGNOSIS — C9000 Multiple myeloma not having achieved remission: Secondary | ICD-10-CM

## 2018-06-03 DIAGNOSIS — D696 Thrombocytopenia, unspecified: Secondary | ICD-10-CM | POA: Diagnosis not present

## 2018-06-03 DIAGNOSIS — Z79899 Other long term (current) drug therapy: Secondary | ICD-10-CM

## 2018-06-03 DIAGNOSIS — D649 Anemia, unspecified: Secondary | ICD-10-CM | POA: Diagnosis not present

## 2018-06-03 DIAGNOSIS — E1122 Type 2 diabetes mellitus with diabetic chronic kidney disease: Secondary | ICD-10-CM | POA: Diagnosis not present

## 2018-06-03 DIAGNOSIS — Z85528 Personal history of other malignant neoplasm of kidney: Secondary | ICD-10-CM | POA: Diagnosis not present

## 2018-06-03 MED ORDER — LENALIDOMIDE 2.5 MG PO CAPS: 2.5000 mg | ORAL_CAPSULE | Freq: Every day | ORAL | 4 refills | Status: DC

## 2018-06-03 NOTE — Progress Notes (Signed)
Diagnosis Multiple myeloma not having achieved remission ([place]) - Plan: CBC with Differential/Platelet, Comprehensive metabolic panel, Lactate dehydrogenase, Protein electrophoresis, serum, IgG, IgA, IgM, Kappa/lambda light chains, Beta 2 microglobulin, serum  Staging Cancer Staging No matching staging information was found for the patient.  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  He was being treated with Revlimid, Velcade and low-dose Decadron under the direction of Dr. Lebron Conners.  He underwent repeat bone marrow biopsy on Feb 27, 2018 at [facility] that showed no diagnostic or definitive evidence of residual plasma cell neoplasm with < 1% plasma cells.  He has been seen at [facility] 04/24/2018.    Review of records obtained from Dr. Alvie Heidelberg at [facility] indicate pt was last treated with Revlimid in May 2019 and Velcade 03/11/2018.  He does not desire transplant at this time.  He is recommended for Revllimid maintenance dose adjusted for renal function at 2.5 mg daily.  He will be seen at [facility] if he decides for transplant.    Labs done 05/29/2018 reviewed with pt and show WBC 7.3 HB 10.3 plts 134,000, Chemistries show Cr 2.97, K+ 4.2 LFTS WNL.  SPEP is 0.1, FLC ratio of 2.  He will begin Revlimid 2.5 mg daily.  He will have repeat labs in 1 month.  He should notify the office if he has any problems once Revlimid resumes.  He reports Dr. Gordy Levan has taken him off ASA.  Pt's on single agent Revlimid should be at lower risk for thrombosis so VTE prophylaxis is not recommended.    2.  Anemia.  HB 10.3.  Will repeat labs in 4 weeks.    3.  RI.  Cr 2.97.  Pt has history of right kidney cancer and had right nephrectomy performed.  Pt placed on modified dose of Revlimid and will watch kidney function closely.    4.  Thrombocytopenia.  Platelets improved at 134,000.    5.  Right kidney cancer.  Pt underwent right nephrectomy in 1998.  Will continue to monitor renal function.    6.  Neuropathy.  Pt remains on  Neurontin.  Velcade remains on hold.  Will continue to monitor.    7.  DM.  BS elevated at > 300.  Follow-up with PCP for ongoing management.  Pt is no longer on decadron.    Greater than 30 minutes with more than 50% spent in counseling and coordination of care.     Interval history: Historical data obtained from note dated 03/17/2018.   67 y.o. male with diagnosis of multiple myeloma IgA kappa.  He is being treated with Revlimid, bortezomib, and low-dose dexamethasone under the direction of Dr. Lebron Conners.  Previous cycle of therapy complicated by grade 3 anemia necessitating holding of bortezomib per Dr. Lebron Conners.    Current status: The patient is seen today for follow-up.  He reports he is not sure about plan from [facility].     Multiple myeloma ([place])   08/29/2017 Initial Diagnosis    Multiple myeloma ([place])      12/02/2017 -  Chemotherapy    The patient had bortezomib SQ (VELCADE) chemo injection 2.75 mg, 1.3 mg/m2 = 2.75 mg, Subcutaneous,  Once, 5 of 5 cycles Administration: 2.75 mg (12/10/2017), 2.75 mg (12/17/2017), 2.75 mg (12/31/2017), 2.75 mg (01/21/2018), 2.75 mg (02/18/2018), 2.75 mg (03/11/2018)  for chemotherapy treatment.         Problem List Patient Active Problem List   Diagnosis Date Noted  . Internal hemorrhoids [K64.8] 03/18/2018  .  Hematochezia [K92.1] 12/18/2017  . Multiple myeloma ([place]) [C90.00] 08/29/2017  . CKD (chronic kidney disease) [N18.9] 08/29/2017  . Symptomatic anemia [D64.9]   . 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 AKI (acute kidney injury) ([place]) [N17.9] 07/24/2017  . Closed fracture of transverse process of lumbar vertebra, with delayed healing, subsequent encounter [S32.009G] 07/24/2017  . Renal failure [N19] 07/23/2017  . Hypercalcemia [E83.52] 07/23/2017  . Anemia [D64.9] 07/23/2017  . Thrombocytopenia ([place]) [D69.6] 07/23/2017  . Diabetes mellitus type 2 in obese ([place]) [E11.69, E66.9] 07/23/2017    Past Medical History Past Medical History:   Diagnosis Date  . Cancer ([place])    multiple myeloma  . Cancer of right kidney ([place])   . Cervical dystonia   . Diabetes mellitus without complication [facility])     Past Surgical History Past Surgical History:  Procedure Laterality Date  . BONE MARROW BIOPSY    . CHOLECYSTECTOMY  2007  . COLONOSCOPY WITH PROPOFOL N/A 01/16/2018   Procedure: COLONOSCOPY WITH PROPOFOL;  Surgeon: Daneil Dolin, MD;  Location[facility];  Service: Endoscopy;  Laterality: N/A;  1:45pm  . ESOPHAGOGASTRODUODENOSCOPY (EGD) WITH PROPOFOL N/A 01/16/2018   Procedure: ESOPHAGOGASTRODUODENOSCOPY (EGD) WITH PROPOFOL;  Surgeon: Daneil Dolin, MD;  Location: [facility];  Service: Endoscopy;  Laterality: N/A;  . GIVENS CAPSULE STUDY N/A 05/12/2018   Procedure: GIVENS CAPSULE STUDY;  Surgeon: Daneil Dolin, MD;  Location: [facility];  Service: Endoscopy;  Laterality: N/A;  7:30am  . NEPHRECTOMY Right 1998   cancer    Family History Family History  Problem Relation Age of Onset  . Heart failure Mother 31  . Dementia Father   . Colon cancer Neg Hx      Social History  reports that he has never smoked. He quit smokeless tobacco use about a year ago. His smokeless tobacco use included chew. He reports that he does not drink alcohol or use drugs.  Medications  Current Outpatient Medications:  .  acyclovir (ZOVIRAX) 400 MG tablet, Take 1 tablet (400 mg total) by mouth 2 (two) times daily., Disp: 60 tablet, Rfl: 3 .  allopurinol (ZYLOPRIM) 300 MG tablet, Take 300 mg by mouth every morning., Disp: , Rfl:  .  cyclobenzaprine (FLEXERIL) 10 MG tablet, Take 10 mg by mouth at bedtime., Disp: , Rfl:  .  dexamethasone (DECADRON) 4 MG tablet, Take 10 tablets (40 mg) on days 1 and 8 of chemo (velcade). Repeat every 21 days., Disp: 30 tablet, Rfl: 3 .  furosemide (LASIX) 40 MG tablet, Take 40 mg by mouth every Monday, Wednesday, and Friday. , Disp: , Rfl:  .  gabapentin (NEURONTIN) 300 MG capsule, Take 1 capsule (300  mg total) by mouth at bedtime., Disp: 60 capsule, Rfl: 4 .  glipiZIDE (GLUCOTROL XL) 5 MG 24 hr tablet, Take 5 mg by mouth daily with breakfast., Disp: , Rfl:  .  Multiple Vitamins-Minerals (CENTRUM SILVER 50+MEN) TABS, Take 1 tablet by mouth every morning., Disp: , Rfl:  .  pravastatin (PRAVACHOL) 20 MG tablet, Take 20 mg by mouth at bedtime. , Disp: , Rfl:  .  sodium bicarbonate 650 MG tablet, Take 1 tablet (650 mg total) by mouth 2 (two) times daily. (Patient taking differently: Take 650 mg by mouth 3 (three) times daily. ), Disp: 60 tablet, Rfl: 1 .  traMADol (ULTRAM) 50 MG tablet, Take 1 tablet (50 mg total) every 6 (six) hours as needed by mouth., Disp: 30 tablet, Rfl: 0 .  lenalidomide (REVLIMID)  2.5 MG capsule, Take 1 capsule (2.5 mg total) by mouth daily., Disp: 30 capsule, Rfl: 4 No current facility-administered medications for this visit.   Facility-Administered Medications Ordered in Other Visits:  .  0.9 %  sodium chloride infusion, 250 mL, Intravenous, Once, Twana First, MD  Allergies Patient has no known allergies.  Review of Systems Review of Systems - Oncology ROS negative other than neuropathy   Physical Exam  Vitals Wt Readings from Last 3 Encounters:  06/03/18 228 lb 14.4 oz (103.8 kg)  05/06/18 229 lb 6.4 oz (104.1 kg)  03/18/18 226 lb 3.2 oz (102.6 kg)   Temp Readings from Last 3 Encounters:  06/03/18 98.6 F (37 C) (Oral)  05/06/18 98.3 F (36.8 C) (Oral)  03/18/18 (!) 97 F (36.1 C) (Oral)   BP Readings from Last 3 Encounters:  06/03/18 (!) 148/66  05/06/18 (!) 154/68  03/18/18 (!) 154/78   Pulse Readings from Last 3 Encounters:  06/03/18 87  05/06/18 (!) 103  03/18/18 98   Constitutional: Well-developed, well-nourished, and in no distress.   HENT: Head: Normocephalic and atraumatic.  Mouth/Throat: No oropharyngeal exudate. Mucosa moist. Eyes: Pupils are equal, round, and reactive to light. Conjunctivae are normal. No scleral icterus.  Neck:  Normal range of motion. Neck supple. No JVD present.  Cardiovascular: Normal rate, regular rhythm and normal heart sounds.  Exam reveals no gallop and no friction rub.   No murmur heard. Pulmonary/Chest: Effort normal and breath sounds normal. No respiratory distress. No wheezes.No rales.  Abdominal: Soft. Bowel sounds are normal. No distension. There is no tenderness. There is no guarding.  Musculoskeletal: No edema or tenderness.  Lymphadenopathy: No cervical, axillary or supraclavicular adenopathy.  Neurological: Alert and oriented to person, place, and time. No cranial nerve deficit.  Skin: Skin is warm and dry. No rash noted. No erythema. No pallor.  Psychiatric: Affect and judgment normal.   Labs No visits with results within 3 Day(s) from this visit.  Latest known visit with results is:  Appointment on 05/29/2018  Component Date Value Ref Range Status  . WBC 05/29/2018 7.3  4.0 - 10.5 K/uL Final  . RBC 05/29/2018 3.05* 4.22 - 5.81 MIL/uL Final  . Hemoglobin 05/29/2018 10.3* 13.0 - 17.0 g/dL Final  . HCT 05/29/2018 30.3* 39.0 - 52.0 % Final  . MCV 05/29/2018 99.3  78.0 - 100.0 fL Final  . MCH 05/29/2018 33.8  26.0 - 34.0 pg Final  . MCHC 05/29/2018 34.0  30.0 - 36.0 g/dL Final  . RDW 05/29/2018 14.1  11.5 - 15.5 % Final  . Platelets 05/29/2018 134* 150 - 400 K/uL Final  . Neutrophils Relative % 05/29/2018 77  % Final  . Neutro Abs 05/29/2018 5.6  1.7 - 7.7 K/uL Final  . Lymphocytes Relative 05/29/2018 17  % Final  . Lymphs Abs 05/29/2018 1.2  0.7 - 4.0 K/uL Final  . Monocytes Relative 05/29/2018 5  % Final  . Monocytes Absolute 05/29/2018 0.4  0.1 - 1.0 K/uL Final  . Eosinophils Relative 05/29/2018 1  % Final  . Eosinophils Absolute 05/29/2018 0.1  0.0 - 0.7 K/uL Final  . Basophils Relative 05/29/2018 0  % Final  . Basophils Absolute 05/29/2018 0.0  0.0 - 0.1 K/uL Final   Performed at [facility], [address], [place], [place] [address]  . Sodium 05/29/2018 136  135 - 145  mmol/L Final  . Potassium 05/29/2018 4.2  3.5 - 5.1 mmol/L Final  . Chloride 05/29/2018 99  98 -  111 mmol/L Final  . CO2 05/29/2018 26  22 - 32 mmol/L Final  . Glucose, Bld 05/29/2018 320* 70 - 99 mg/dL Final  . BUN 05/29/2018 42* 8 - 23 mg/dL Final  . Creatinine, Ser 05/29/2018 2.97* 0.61 - 1.24 mg/dL Final  . Calcium 05/29/2018 9.2  8.9 - 10.3 mg/dL Final  . Total Protein 05/29/2018 6.7  6.5 - 8.1 g/dL Final  . Albumin 05/29/2018 4.0  3.5 - 5.0 g/dL Final  . AST 05/29/2018 21  15 - 41 U/L Final  . ALT 05/29/2018 28  0 - 44 U/L Final  . Alkaline Phosphatase 05/29/2018 184* 38 - 126 U/L Final  . Total Bilirubin 05/29/2018 0.9  0.3 - 1.2 mg/dL Final  . GFR calc non Af Amer 05/29/2018 20* >60 mL/min Final  . GFR calc Af Amer 05/29/2018 23* >60 mL/min Final   Comment: (NOTE) The eGFR has been calculated using the CKD EPI equation. This calculation has not been validated in all clinical situations. eGFR's persistently <60 mL/min signify possible Chronic Kidney Disease.   Georgiann Hahn gap 05/29/2018 11  5 - 15 Final   Performed at [facility], [address] [place], [place] [address]  . LDH 05/29/2018 103  98 - 192 U/L Final   Performed at [facility], [address] [place], [place] [address]  . Total Protein ELP 05/29/2018 6.2  6.0 - 8.5 g/dL Final  . Albumin ELP 05/29/2018 3.7  2.9 - 4.4 g/dL Final  . Alpha-1-Globulin 05/29/2018 0.2  0.0 - 0.4 g/dL Final  . Alpha-2-Globulin 05/29/2018 0.9  0.4 - 1.0 g/dL Final  . Beta Globulin 05/29/2018 1.0  0.7 - 1.3 g/dL Final  . Gamma Globulin 05/29/2018 0.4  0.4 - 1.8 g/dL Final  . M-Spike, % 05/29/2018 0.1* Not Observed g/dL Final  . SPE Interp. 05/29/2018 Comment   Final   Comment: (NOTE) Faint band in gamma region suspicious for monoclonal immunoglobulin. Performed At: [facility] [place], [place] [id number] Rush Farmer MD [id number]   . Comment 05/29/2018 Comment   Final   Comment: (NOTE) Protein  electrophoresis scan will follow via computer, mail, or courier delivery.   [name] GLOBULIN, TOTAL 05/29/2018 2.5  2.2 - 3.9 g/dL Corrected  . A/G Ratio 05/29/2018 1.5  0.7 - 1.7 Corrected  . Ferritin 05/29/2018 812* 24 - 336 ng/mL Final   Performed at [facility], [place] [address], [place],  [address]  . IgG (Immunoglobin G), Serum 05/29/2018 426* 700 - 1,600 mg/dL Final  . IgA 05/29/2018 120  61 - 437 mg/dL Final  . IgM (Immunoglobulin M), Srm 05/29/2018 58  20 - 172 mg/dL Final   Comment: (NOTE) Performed At: [facility] [place], [place] [id number] Rush Farmer MD [id number]   . Beta-2 Microglobulin 05/29/2018 6.7* 0.6 - 2.4 mg/L Final   Comment: (NOTE) Siemens Immulite 2000 Immunochemiluminometric assay (ICMA) Values obtained with different assay methods or kits cannot be used interchangeably. Results cannot be interpreted as absolute evidence of the presence or absence of malignant disease. Performed At: [facility] [place], [place] [id number] Rush Farmer MD [id number]   . Kappa free light chain 05/29/2018 37.2* 3.3 - 19.4 mg/L Final  . Lamda free light chains 05/29/2018 17.2  5.7 - 26.3 mg/L Final  . Kappa, lamda light chain ratio 05/29/2018 2.16* 0.26 - 1.65 Final   Comment: (NOTE) Performed At: [facility] [address] [place], [place] [id number] Perlie Gold  Derinda Late MD JS:[id number]      Pathology Orders Placed This Encounter  Procedures  . CBC with Differential/Platelet    Standing Status:   Future    Standing Expiration Date:   06/04/2019  . Comprehensive metabolic panel    Standing Status:   Future    Standing Expiration Date:   06/04/2019  . Lactate dehydrogenase    Standing Status:   Future    Standing Expiration Date:   06/04/2019  . Protein electrophoresis, serum    Standing Status:   Future    Standing Expiration Date:   06/04/2019  . IgG, IgA, IgM    Standing Status:   Future     Standing Expiration Date:   06/04/2019  . Kappa/lambda light chains    Standing Status:   Future    Standing Expiration Date:   06/04/2019  . Beta 2 microglobulin, serum    Standing Status:   Future    Standing Expiration Date:   06/04/2019       Zoila Shutter MD

## 2018-06-03 NOTE — Patient Instructions (Signed)
[facility] Discharge Instructions  You saw Dr. Higgs today.   Thank you for choosing [facility] at [facility] to provide your oncology and hematology care.  To afford each patient quality time with our provider, please arrive at least 15 minutes before your scheduled appointment time.   If you have a lab appointment with the Cancer Center please come in thru the  Main Entrance and check in at the main information desk  You need to re-schedule your appointment should you arrive 10 or more minutes late.  We strive to give you quality time with our providers, and arriving late affects you and other patients whose appointments are after yours.  Also, if you no show three or more times for appointments you may be dismissed from the clinic at the providers discretion.     Again, thank you for choosing [facility].  Our hope is that these requests will decrease the amount of time that you wait before being seen by our physicians.       _____________________________________________________________  Should you have questions after your visit to [facility], please contact our office at [phone] between the hours of 8:00 a.m. and 4:30 p.m.  Voicemails left after 4:00 p.m. will not be returned until the following business day.  For prescription refill requests, have your pharmacy contact our office and allow 72 hours.    Cancer Center Support Programs:   > Cancer Support Group  2nd Tuesday of the month 1pm-2pm, Journey Room    

## 2018-06-16 ENCOUNTER — Other Ambulatory Visit (HOSPITAL_COMMUNITY): Payer: Self-pay | Admitting: *Deleted

## 2018-06-16 DIAGNOSIS — C9000 Multiple myeloma not having achieved remission: Secondary | ICD-10-CM

## 2018-06-16 MED ORDER — ACYCLOVIR 400 MG PO TABS: 400.0000 mg | ORAL_TABLET | Freq: Two times a day (BID) | ORAL | 3 refills | Status: DC

## 2018-06-18 ENCOUNTER — Ambulatory Visit (INDEPENDENT_AMBULATORY_CARE_PROVIDER_SITE_OTHER): Payer: Medicare Other | Admitting: Nurse Practitioner

## 2018-06-18 ENCOUNTER — Encounter: Payer: Self-pay | Admitting: Internal Medicine

## 2018-06-18 ENCOUNTER — Encounter: Payer: Self-pay | Admitting: Nurse Practitioner

## 2018-06-18 VITALS — BP 159/77 | HR 99 | Temp 97.0°F | Ht 67.0 in | Wt 229.8 lb

## 2018-06-18 DIAGNOSIS — D649 Anemia, unspecified: Secondary | ICD-10-CM

## 2018-06-18 DIAGNOSIS — K648 Other hemorrhoids: Secondary | ICD-10-CM | POA: Diagnosis not present

## 2018-06-18 DIAGNOSIS — K921 Melena: Secondary | ICD-10-CM

## 2018-06-18 NOTE — Assessment & Plan Note (Signed)
No hematochezia in 3 or more weeks since stopping aspirin and starting Preparation H.  Hemorrhoid treatment as per above.  Consider repeat capsule if large volume hematochezia occurs.  EGD and colonoscopy were essentially normal, capsule endoscopy had limited view due to debris.  If repeat is undertaken recommended extended prep and Reglan as per HPI.  Notify us if any further hematochezia.  Return for follow-up in 6 months.

## 2018-06-18 NOTE — Assessment & Plan Note (Signed)
Anemia doing well.  He is continues to see [place] [facility].  He underwent stem cell harvest but no plans for bone marrow transplant at this time.  His last hemoglobin drawn on 05/29/2018 was 10.3, which is the best it is been since September 2018 (as far back as our lab results for this patient ago).  No need to recheck now, repeat lab draw ordered upcoming in September.  Continue treatment by hematology.  We are available to assist as needed.  Follow-up here in 6 months.

## 2018-06-18 NOTE — Assessment & Plan Note (Signed)
Known internal hemorrhoids.  Since stopping aspirin and starting Preparation H his hemorrhoid symptoms have improved.  No hematochezia in 3 weeks or more.  Recommend he continue Preparation H.  Call us if he needs Anusol suppositories or rectal cream.  Notify us of any recurrent bleeding.  EGD and colonoscopy unremarkable.  Capsule study limited view but no major/large issues.  If large volume hematochezia occurs can consider repeating capsule with extended prep.  Follow-up in 6 months.

## 2018-06-18 NOTE — Progress Notes (Signed)
Referring Provider: Asencion Noble, MD Primary Care Physician:  Asencion Noble, MD Primary GI:  Dr. Gala Romney  Chief Complaint  Patient presents with  . Anemia    f/u.     HPI:   Tom Marshall is a 67 y.o. male who presents for follow-up on anemia.  The patient was last seen in our office 03/18/2018 for anemia, hematochezia, internal hemorrhoids.  He was initially referred by oncology.  History of hemoglobin 7.6 and as low as 5.5.  Colonoscopy updated 01/16/2018 which found diverticulosis in the sigmoid and descending colon, nonbleeding internal hemorrhoids, otherwise normal.  EGD the same day found essentially normal exam.  Recommended consideration of small bowel capsule to complete evaluation of GI bleed.  Repeat colonoscopy in 10 years.  At his last visit he was doing well overall.  Recently at [facility] with plans for possible stem cell harvest, no plans for bone marrow transplant at this time.  Kidney function at 24% per nephrology.  Still with some intermittent hematochezia a couple times a week, mostly on the toilet tissue but some in the stool.  Intermittent hemorrhoid symptoms.  No other GI complaints or symptoms of significant anemia.  ER precautions were given, recommended scheduling of capsule endoscopy, follow-up in 3 months.  [facility] capsule endoscopy completed 05/12/2018 which was a complete exam.  Markedly poor prep with debris throughout small bowel.  Any small tumor, lesion, bleeding would be difficult to see.  Possible gastric and scant small bowel erosions noted but still difficult to determine due to limited visibility.  Overall felt multifactorial anemia and likely low volume hematochezia due to known internal hemorrhoids rather than significant GI bleed.  Recommended discontinuing 81 mg daily aspirin if no cardiac history.  Recommend repeat capsule endoscopy if any further transfusion dependent anemia with a modified prep including extended, clear liquids, low-dose Reglan 30 to 45 minutes  prior to capsule ingestion.  Today he states he's doing well overall. States his kidney function is stable. He had stem cell harvest in June which went well. Still no decision for bone marrow transplant at this time. States hematochezia has improved much since stopping ASA and starting Preparation H. Denies abdominal pain, N/V, melena, fever, chills, unintentional weight loss. No hematochezia in 3+ weeks. Denies chest pain, dyspnea, dizziness, lightheadedness, syncope, near syncope. Denies any other upper or lower GI symptoms.  Past Medical History:  Diagnosis Date  . Cancer ([place])    multiple myeloma  . Cancer of right kidney ([place])   . Cervical dystonia   . Diabetes mellitus without complication [facility])     Past Surgical History:  Procedure Laterality Date  . BONE MARROW BIOPSY    . CHOLECYSTECTOMY  2007  . COLONOSCOPY WITH PROPOFOL N/A 01/16/2018   Procedure: COLONOSCOPY WITH PROPOFOL;  Surgeon: Daneil Dolin, MD;  [facility];  Service: Endoscopy;  Laterality: N/A;  1:45pm  . ESOPHAGOGASTRODUODENOSCOPY (EGD) WITH PROPOFOL N/A 01/16/2018   Procedure: ESOPHAGOGASTRODUODENOSCOPY (EGD) WITH PROPOFOL;  Surgeon: Daneil Dolin, MD;  [facility];  Service: Endoscopy;  Laterality: N/A;  . GIVENS CAPSULE STUDY N/A 05/12/2018   Procedure: GIVENS CAPSULE STUDY;  Surgeon: Daneil Dolin, MD;  Location: [facility];  Service: Endoscopy;  Laterality: N/A;  7:30am  . NEPHRECTOMY Right 1998   cancer    Current Outpatient Medications  Medication Sig Dispense Refill  . acyclovir (ZOVIRAX) 400 MG tablet Take 1 tablet (400 mg total) by mouth 2 (two) times daily. [place]  tablet 3  . allopurinol (ZYLOPRIM) 300 MG tablet Take 300 mg by mouth every morning.    . cyclobenzaprine (FLEXERIL) 10 MG tablet Take 10 mg by mouth at bedtime.    [name] dexamethasone (DECADRON) 4 MG tablet Take 10 tablets (40 mg) on days 1 and 8 of chemo (velcade). Repeat every 21 days. 30 tablet 3  . furosemide  (LASIX) 40 MG tablet Take 40 mg by mouth every Monday, Wednesday, and Friday.     . gabapentin (NEURONTIN) 300 MG capsule Take 1 capsule (300 mg total) by mouth at bedtime. 60 capsule 4  . glipiZIDE (GLUCOTROL XL) 5 MG 24 hr tablet Take 5 mg by mouth daily with breakfast.    . lenalidomide (REVLIMID) 2.5 MG capsule Take 1 capsule (2.5 mg total) by mouth daily. 30 capsule 4  . Multiple Vitamins-Minerals (CENTRUM SILVER 50+MEN) TABS Take 1 tablet by mouth every morning.    . pravastatin (PRAVACHOL) 20 MG tablet Take 20 mg by mouth at bedtime.     . sodium bicarbonate 650 MG tablet Take 1 tablet (650 mg total) by mouth 2 (two) times daily. (Patient taking differently: Take 650 mg by mouth 3 (three) times daily. ) 60 tablet 1  . traMADol (ULTRAM) 50 MG tablet Take 1 tablet (50 mg total) every 6 (six) hours as needed by mouth. 30 tablet 0  . lenalidomide (REVLIMID) 25 MG capsule Take 1 capsule by mouth daily.     No current facility-administered medications for this visit.    Facility-Administered Medications Ordered in Other Visits  Medication Dose Route Frequency Provider Last Rate Last Dose  . 0.9 %  sodium chloride infusion  250 mL Intravenous Once Twana First, MD        Allergies as of 06/18/2018  . (No Known Allergies)    Family History  Problem Relation Age of Onset  . Heart failure Mother 61  . Dementia Father   . Colon cancer Neg Hx     Social History   Socioeconomic History  . Marital status: Single    Spouse name: Not on file  . Number of children: Not on file  . Years of education: Not on file  . Highest education level: Not on file  Occupational History  . Occupation: Marine scientist, Medical laboratory scientific officer Needs  . Financial resource strain: Not on file  . Food insecurity:    Worry: Not on file    Inability: Not on file  . Transportation needs:    Medical: Not on file    Non-medical: Not on file  Tobacco Use  . Smoking status: Never Smoker  . Smokeless tobacco:  Former Systems developer    Types: Chew  Substance and Sexual Activity  . Alcohol use: No  . Drug use: No  . Sexual activity: Not Currently  Lifestyle  . Physical activity:    Days per week: Not on file    Minutes per session: Not on file  . Stress: Not on file  Relationships  . Social connections:    Talks on phone: Not on file    Gets together: Not on file    Attends religious service: Not on file    Active member of club or organization: Not on file    Attends meetings of clubs or organizations: Not on file    Relationship status: Not on file  Other Topics Concern  . Not on file  Social History Narrative  . Not on file    Review of Systems:  General: Negative for anorexia, weight loss, fever, chills. Still with ongoing fatigue. ENT: Negative for hoarseness, difficulty swallowing. CV: Negative for chest pain, angina, palpitations, peripheral edema.  Respiratory: Negative for dyspnea at rest, cough, sputum, wheezing.  GI: See history of present illness. Endo: Negative for unusual weight change.  Heme: Negative for bruising or bleeding. Allergy: Negative for rash or hives.   Physical Exam: BP (!) 159/77   Pulse 99   Temp (!) 97 F (36.1 C) (Oral)   Ht '5\' 7"'  (1.702 m)   Wt 229 lb 12.8 oz (104.2 kg)   BMI 35.99 kg/m  General:   Alert and oriented. Pleasant and cooperative. Well-nourished and well-developed.  Eyes:  Without icterus, sclera clear and conjunctiva pink.  Ears:  Normal auditory acuity. Cardiovascular:  S1, S2 present without murmurs appreciated. Extremities without clubbing or edema. Respiratory:  Clear to auscultation bilaterally. No wheezes, rales, or rhonchi. No distress.  Gastrointestinal:  +BS, soft, non-tender and non-distended. No HSM noted. No guarding or rebound. No masses appreciated.  Rectal:  Deferred  Musculoskalatal:  Symmetrical without gross deformities. Neurologic:  Alert and oriented x4;  grossly normal neurologically. Psych:  Alert and cooperative.  Normal mood and affect. Heme/Lymph/Immune: No excessive bruising noted.    06/18/2018 11:35 AM   Disclaimer: This note was dictated with voice recognition software. Similar sounding words can inadvertently be transcribed and may not be corrected upon review.

## 2018-06-18 NOTE — Progress Notes (Signed)
CC'D TO PCP °

## 2018-06-18 NOTE — Patient Instructions (Signed)
1. Continue current medications. 2. If Preparation H stops working for your hemorrhoids, let us know and we can send in a prescription cream or suppository. 3. Continue to follow-up with [facility] as they recommend. 4. Return for follow-up in 6 months. 5. Call us if you have any questions or concerns  At [facility] we value your feedback. You may receive a survey about your visit today. Please share your experience as we strive to create trusting relationships with our patients to provide genuine, compassionate, quality care.  We appreciate your understanding and patience as we review any laboratory studies, imaging, and other diagnostic tests that are ordered as we care for you. Our office policy is 5 business days for review of these results, and any emergent or urgent results are addressed in a timely manner for your best interest. If you do not hear from our office in 1 week, please contact us.   We also encourage the use of MyChart, which contains your medical information for your review as well. If you are not enrolled in this feature, an access code is available on this after visit summary for your convenience. Thank you for allowing [place] to be involved in the care of you and your family.   It was great to see you today!  I hope you have a great summer!!

## 2018-07-01 ENCOUNTER — Inpatient Hospital Stay (HOSPITAL_COMMUNITY): Payer: Medicare Other | Attending: Internal Medicine

## 2018-07-01 DIAGNOSIS — E1122 Type 2 diabetes mellitus with diabetic chronic kidney disease: Secondary | ICD-10-CM | POA: Insufficient documentation

## 2018-07-01 DIAGNOSIS — E1142 Type 2 diabetes mellitus with diabetic polyneuropathy: Secondary | ICD-10-CM | POA: Insufficient documentation

## 2018-07-01 DIAGNOSIS — Z85528 Personal history of other malignant neoplasm of kidney: Secondary | ICD-10-CM | POA: Diagnosis not present

## 2018-07-01 DIAGNOSIS — Z9221 Personal history of antineoplastic chemotherapy: Secondary | ICD-10-CM | POA: Diagnosis not present

## 2018-07-01 DIAGNOSIS — Z905 Acquired absence of kidney: Secondary | ICD-10-CM | POA: Insufficient documentation

## 2018-07-01 DIAGNOSIS — D696 Thrombocytopenia, unspecified: Secondary | ICD-10-CM | POA: Insufficient documentation

## 2018-07-01 DIAGNOSIS — D649 Anemia, unspecified: Secondary | ICD-10-CM | POA: Diagnosis not present

## 2018-07-01 DIAGNOSIS — C9000 Multiple myeloma not having achieved remission: Secondary | ICD-10-CM | POA: Insufficient documentation

## 2018-07-01 LAB — COMPREHENSIVE METABOLIC PANEL
ALBUMIN: 4 g/dL (ref 3.5–5.0)
ALK PHOS: 143 U/L — AB (ref 38–126)
ALT: 34 U/L (ref 0–44)
AST: 23 U/L (ref 15–41)
Anion gap: 13 (ref 5–15)
BILIRUBIN TOTAL: 0.9 mg/dL (ref 0.3–1.2)
BUN: 45 mg/dL — AB (ref 8–23)
CALCIUM: 8.9 mg/dL (ref 8.9–10.3)
CO2: 24 mmol/L (ref 22–32)
Chloride: 96 mmol/L — ABNORMAL LOW (ref 98–111)
Creatinine, Ser: 3.32 mg/dL — ABNORMAL HIGH (ref 0.61–1.24)
GFR calc Af Amer: 20 mL/min — ABNORMAL LOW (ref >60)
GFR, EST NON AFRICAN AMERICAN: 18 mL/min — AB (ref >60)
GLUCOSE: 417 mg/dL — AB (ref 70–99)
POTASSIUM: 3.8 mmol/L (ref 3.5–5.1)
Sodium: 133 mmol/L — ABNORMAL LOW (ref 135–145)
TOTAL PROTEIN: 6.8 g/dL (ref 6.5–8.1)

## 2018-07-01 LAB — CBC WITH DIFFERENTIAL/PLATELET
BASOS PCT: 1 %
Basophils Absolute: 0 10*3/uL (ref 0.0–0.1)
Eosinophils Absolute: 0.2 10*3/uL (ref 0.0–0.7)
Eosinophils Relative: 3 %
HEMATOCRIT: 30.4 % — AB (ref 39.0–52.0)
Hemoglobin: 10.2 g/dL — ABNORMAL LOW (ref 13.0–17.0)
LYMPHS ABS: 1.1 10*3/uL (ref 0.7–4.0)
Lymphocytes Relative: 18 %
MCH: 33 pg (ref 26.0–34.0)
MCHC: 33.6 g/dL (ref 30.0–36.0)
MCV: 98.4 fL (ref 78.0–100.0)
MONO ABS: 0.6 10*3/uL (ref 0.1–1.0)
MONOS PCT: 9 %
NEUTROS ABS: 4 10*3/uL (ref 1.7–7.7)
Neutrophils Relative %: 69 %
Platelets: 106 10*3/uL — ABNORMAL LOW (ref 150–400)
RBC: 3.09 MIL/uL — ABNORMAL LOW (ref 4.22–5.81)
RDW: 13.1 % (ref 11.5–15.5)
WBC: 5.9 10*3/uL (ref 4.0–10.5)

## 2018-07-01 LAB — LACTATE DEHYDROGENASE: LDH: 109 U/L (ref 98–192)

## 2018-07-02 ENCOUNTER — Other Ambulatory Visit (HOSPITAL_COMMUNITY): Payer: Self-pay | Admitting: *Deleted

## 2018-07-02 DIAGNOSIS — C9000 Multiple myeloma not having achieved remission: Secondary | ICD-10-CM

## 2018-07-02 LAB — KAPPA/LAMBDA LIGHT CHAINS
KAPPA, LAMDA LIGHT CHAIN RATIO: 2.15 — AB (ref 0.26–1.65)
Kappa free light chain: 65.9 mg/L — ABNORMAL HIGH (ref 3.3–19.4)
Lambda free light chains: 30.6 mg/L — ABNORMAL HIGH (ref 5.7–26.3)

## 2018-07-02 LAB — PROTEIN ELECTROPHORESIS, SERUM
A/G RATIO SPE: 1.4 (ref 0.7–1.7)
ALPHA-2-GLOBULIN: 0.9 g/dL (ref 0.4–1.0)
Albumin ELP: 3.6 g/dL (ref 2.9–4.4)
Alpha-1-Globulin: 0.2 g/dL (ref 0.0–0.4)
Beta Globulin: 0.9 g/dL (ref 0.7–1.3)
GLOBULIN, TOTAL: 2.5 g/dL (ref 2.2–3.9)
Gamma Globulin: 0.5 g/dL (ref 0.4–1.8)
TOTAL PROTEIN ELP: 6.1 g/dL (ref 6.0–8.5)

## 2018-07-02 LAB — IGG, IGA, IGM
IGA: 125 mg/dL (ref 61–437)
IgG (Immunoglobin G), Serum: 423 mg/dL — ABNORMAL LOW (ref 700–1600)
IgM (Immunoglobulin M), Srm: 64 mg/dL (ref 20–172)

## 2018-07-02 LAB — BETA 2 MICROGLOBULIN, SERUM: Beta-2 Microglobulin: 7.4 mg/L — ABNORMAL HIGH (ref 0.6–2.4)

## 2018-07-02 MED ORDER — LENALIDOMIDE 2.5 MG PO CAPS: 2.5000 mg | ORAL_CAPSULE | Freq: Every day | ORAL | 4 refills | Status: DC

## 2018-07-02 NOTE — Telephone Encounter (Signed)
Chart reviewed, Revlimid 2.5 mg refiled.

## 2018-07-07 ENCOUNTER — Ambulatory Visit (HOSPITAL_COMMUNITY): Payer: Medicare Other | Admitting: Internal Medicine

## 2018-07-08 ENCOUNTER — Other Ambulatory Visit: Payer: Self-pay

## 2018-07-08 ENCOUNTER — Inpatient Hospital Stay (HOSPITAL_BASED_OUTPATIENT_CLINIC_OR_DEPARTMENT_OTHER): Payer: Medicare Other | Admitting: Internal Medicine

## 2018-07-08 VITALS — BP 123/93 | HR 83 | Temp 98.3°F | Resp 18 | Wt 226.1 lb

## 2018-07-08 DIAGNOSIS — Z9221 Personal history of antineoplastic chemotherapy: Secondary | ICD-10-CM | POA: Diagnosis not present

## 2018-07-08 DIAGNOSIS — Z905 Acquired absence of kidney: Secondary | ICD-10-CM | POA: Diagnosis not present

## 2018-07-08 DIAGNOSIS — C9000 Multiple myeloma not having achieved remission: Secondary | ICD-10-CM

## 2018-07-08 DIAGNOSIS — D696 Thrombocytopenia, unspecified: Secondary | ICD-10-CM | POA: Diagnosis not present

## 2018-07-08 DIAGNOSIS — E1122 Type 2 diabetes mellitus with diabetic chronic kidney disease: Secondary | ICD-10-CM | POA: Diagnosis not present

## 2018-07-08 DIAGNOSIS — E1142 Type 2 diabetes mellitus with diabetic polyneuropathy: Secondary | ICD-10-CM

## 2018-07-08 DIAGNOSIS — D649 Anemia, unspecified: Secondary | ICD-10-CM

## 2018-07-08 DIAGNOSIS — Z85528 Personal history of other malignant neoplasm of kidney: Secondary | ICD-10-CM | POA: Diagnosis not present

## 2018-07-08 NOTE — Progress Notes (Signed)
Pt denies any missed doses of Revlimid. Pt also denies any side effects or issues with taking the Revlimid.

## 2018-07-08 NOTE — Progress Notes (Signed)
Diagnosis Multiple myeloma not having achieved remission ([place]) - Plan: CBC with Differential/Platelet, Comprehensive metabolic panel, Lactate dehydrogenase, Protein electrophoresis, serum, IgG, IgA, IgM, Kappa/lambda light chains, Beta 2 microglobulin, serum  Staging Cancer Staging No matching staging information was found for the patient.   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  He was being treated with Revlimid, Velcade and low-dose Decadron under the direction of Dr. Lebron Conners.  He underwent repeat bone marrow biopsy on Feb 27, 2018 at [facility] that showed no diagnostic or definitive evidence of residual plasma cell neoplasm with < 1% plasma cells.  He has been seen at [facility] 04/24/2018.    Review of records obtained from Dr. Alvie Heidelberg at [facility] indicate pt was last treated with Revlimid in May 2019 and Velcade 03/11/2018.  He does not desire transplant at this time.  He is recommended for Revllimid maintenance dose adjusted for renal function at 2.5 mg daily.  He will be seen at [facility] if he decides for transplant.    Labs done 07/01/2018 reviewed and showed WBC 5.9 HB 10.2 plts 106,000.  Chemistries WNL with K+ 3.8, Normal LFTS.  SPEP negative,FLC ratio of 2.15.  He will continue Revlimid at 2.5 mg daily and will RTC in 4 weeks for repeat labs.  2.  RI.  Cr 3.32.  Will continue to monitor chemistries as therapy proceeds.  He has right kidney cancer and had right nephrectomy performed.  Pt placed on modified dose of Revlimid and will watch kidney function closely.    3.  Anemia.  HB 10.2.  Will repeat labs in 4 weeks.   4.  Thrombocytopenia.  Plt count 106,000.  Will repeat labs in 4 weeks.    5.  DM.  BS > 400.  I have referred pt to PCP for management.  Pt no longer on Decadron.    6.  Neuropathy.  Pt on Neurontin.  Will continue to monitor.  I have discussed with him uncontrolled DM may also affect neuropathy.    Current Status:  Pt is seen today for follow-up.  He is here to go over labs.     30 minutes spent with more than 50% spent in counseling and coordination of care.      Multiple myeloma ([place])   08/29/2017 Initial Diagnosis    Multiple myeloma ([place])    12/02/2017 -  Chemotherapy    The patient had bortezomib SQ (VELCADE) chemo injection 2.75 mg, 1.3 mg/m2 = 2.75 mg, Subcutaneous,  Once, 5 of 5 cycles Administration: 2.75 mg (12/10/2017), 2.75 mg (12/17/2017), 2.75 mg (12/31/2017), 2.75 mg (01/21/2018), 2.75 mg (02/18/2018), 2.75 mg (03/11/2018)  for chemotherapy treatment.       Problem List Patient Active Problem List   Diagnosis Date Noted  . Internal hemorrhoids [K64.8] 03/18/2018  . Hematochezia [K92.1] 12/18/2017  . Multiple myeloma ([place]) [C90.00] 08/29/2017  . CKD (chronic kidney disease) [N18.9] 08/29/2017  . Symptomatic anemia [D64.9]   . 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 AKI (acute kidney injury) ([place]) [N17.9] 07/24/2017  . Closed fracture of transverse process of lumbar vertebra, with delayed healing, subsequent encounter [S32.009G] 07/24/2017  . Renal failure [N19] 07/23/2017  . Hypercalcemia [E83.52] 07/23/2017  . Anemia [D64.9] 07/23/2017  . Thrombocytopenia ([place]) [D69.6] 07/23/2017  . Diabetes mellitus type 2 in obese ([place]) [E11.69, E66.9] 07/23/2017    Past Medical History Past Medical History:  Diagnosis Date  . Cancer ([place])    multiple myeloma  . Cancer of right kidney ([place])   .  Cervical dystonia   . Diabetes mellitus without complication [facility])     Past Surgical History Past Surgical History:  Procedure Laterality Date  . BONE MARROW BIOPSY    . CHOLECYSTECTOMY  2007  . COLONOSCOPY WITH PROPOFOL N/A 01/16/2018   Procedure: COLONOSCOPY WITH PROPOFOL;  Surgeon: Daneil Dolin, MD;  [facility];  Service: Endoscopy;  Laterality: N/A;  1:45pm  . ESOPHAGOGASTRODUODENOSCOPY (EGD) WITH PROPOFOL N/A 01/16/2018   Procedure: ESOPHAGOGASTRODUODENOSCOPY (EGD) WITH PROPOFOL;  Surgeon: Daneil Dolin, MD;  [facility];  Service: Endoscopy;  Laterality: N/A;  . GIVENS CAPSULE STUDY N/A 05/12/2018   Procedure: GIVENS CAPSULE STUDY;  Surgeon: Daneil Dolin, MD;  Location[facility];  Service: Endoscopy;  Laterality: N/A;  7:30am  . NEPHRECTOMY Right 1998   cancer    Family History Family History  Problem Relation Age of Onset  . Heart failure Mother 22  . Dementia Father   . Colon cancer Neg Hx      Social History  reports that he has never smoked. He quit smokeless tobacco use about 13 months ago.  His smokeless tobacco use included chew. He reports that he does not drink alcohol or use drugs.  Medications  Current Outpatient Medications:  .  acyclovir (ZOVIRAX) 400 MG tablet, Take 1 tablet (400 mg total) by mouth 2 (two) times daily., Disp: 60 tablet, Rfl: 3 .  allopurinol (ZYLOPRIM) 300 MG tablet, Take 300 mg by mouth every morning., Disp: , Rfl:  .  cyclobenzaprine (FLEXERIL) 10 MG tablet, Take 10 mg by mouth at bedtime., Disp: , Rfl:  .  furosemide (LASIX) 40 MG tablet, Take 40 mg by mouth every Monday, Wednesday, and Friday. , Disp: , Rfl:  .  gabapentin (NEURONTIN) 300 MG capsule, Take 1 capsule (300 mg total) by mouth at bedtime., Disp: 60 capsule, Rfl: 4 .  glipiZIDE (GLUCOTROL XL) 5 MG 24 hr tablet, Take 10 mg by mouth daily with breakfast. , Disp: , Rfl:  .  lenalidomide (REVLIMID) 2.5 MG capsule, Take 1 capsule (2.5 mg total) by mouth daily., Disp: 30 capsule, Rfl: 4 .  Multiple Vitamins-Minerals (CENTRUM SILVER 50+MEN) TABS, Take 1 tablet by mouth every morning., Disp: , Rfl:  .  pravastatin (PRAVACHOL) 20 MG tablet, Take 20 mg by mouth at bedtime. , Disp: , Rfl:  .  sodium bicarbonate 650 MG tablet, Take 1 tablet (650 mg total) by mouth 2 (two) times daily. (Patient taking differently: Take 650 mg by mouth 3 (three) times daily. ), Disp: 60 tablet, Rfl: 1 No current facility-administered medications for this visit.   Facility-Administered Medications Ordered in Other Visits:   .  0.9 %  sodium chloride infusion, 250 mL, Intravenous, Once, Twana First, MD  Allergies Patient has no known allergies.  Review of Systems Review of Systems - Oncology ROS negative other than neuropathy.     Physical Exam  Vitals Wt Readings from Last 3 Encounters:  07/08/18 226 lb 1.6 oz (102.6 kg)  06/18/18 229 lb 12.8 oz (104.2 kg)  06/03/18 228 lb 14.4 oz (103.8 kg)   Temp Readings from Last 3 Encounters:  07/08/18 98.3 F (36.8 C) (Oral)  06/18/18 (!) 97 F (36.1 C) (Oral)  06/03/18 98.6 F (37 C) (Oral)   BP Readings from Last 3 Encounters:  07/08/18 (!) 123/93  06/18/18 (!) 159/77  06/03/18 (!) 148/66   Pulse Readings from Last 3 Encounters:  07/08/18 83  06/18/18 99  06/03/18 87  Constitutional: Well-developed, well-nourished, and in no distress.   HENT: Head: Normocephalic and atraumatic.  Mouth/Throat: No oropharyngeal exudate. Mucosa moist. Eyes: Pupils are equal, round, and reactive to light. Conjunctivae are normal. No scleral icterus.  Neck: Normal range of motion. Neck supple. No JVD present.  Cardiovascular: Normal rate, regular rhythm and normal heart sounds.  Exam reveals no gallop and no friction rub.   No murmur heard. Pulmonary/Chest: Effort normal and breath sounds normal. No respiratory distress. No wheezes.No rales.  Abdominal: Soft. Bowel sounds are normal. No distension. There is no tenderness. There is no guarding.  Musculoskeletal: No edema or tenderness.  Lymphadenopathy: No cervical, axillary or supraclavicular adenopathy.  Neurological: Alert and oriented to person, place, and time. No cranial nerve deficit.  Skin: Skin is warm and dry. No rash noted. No erythema. No pallor.  Psychiatric: Affect and judgment normal.   Labs No visits with results within 3 Day(s) from this visit.  Latest known visit with results is:  Appointment on 07/01/2018  Component Date Value Ref Range Status  . WBC 07/01/2018 5.9  4.0 - 10.5 K/uL Final   . RBC 07/01/2018 3.09* 4.22 - 5.81 MIL/uL Final  . Hemoglobin 07/01/2018 10.2* 13.0 - 17.0 g/dL Final  . HCT 07/01/2018 30.4* 39.0 - 52.0 % Final  . MCV 07/01/2018 98.4  78.0 - 100.0 fL Final  . MCH 07/01/2018 33.0  26.0 - 34.0 pg Final  . MCHC 07/01/2018 33.6  30.0 - 36.0 g/dL Final  . RDW 07/01/2018 13.1  11.5 - 15.5 % Final  . Platelets 07/01/2018 106* 150 - 400 K/uL Final   Comment: SPECIMEN CHECKED FOR CLOTS PLATELET COUNT CONFIRMED BY SMEAR   . Neutrophils Relative % 07/01/2018 69  % Final  . Neutro Abs 07/01/2018 4.0  1.7 - 7.7 K/uL Final  . Lymphocytes Relative 07/01/2018 18  % Final  . Lymphs Abs 07/01/2018 1.1  0.7 - 4.0 K/uL Final  . Monocytes Relative 07/01/2018 9  % Final  . Monocytes Absolute 07/01/2018 0.6  0.1 - 1.0 K/uL Final  . Eosinophils Relative 07/01/2018 3  % Final  . Eosinophils Absolute 07/01/2018 0.2  0.0 - 0.7 K/uL Final  . Basophils Relative 07/01/2018 1  % Final  . Basophils Absolute 07/01/2018 0.0  0.0 - 0.1 K/uL Final   Performed at [facility], [address], [place], [place] [address]  . Sodium 07/01/2018 133* 135 - 145 mmol/L Final  . Potassium 07/01/2018 3.8  3.5 - 5.1 mmol/L Final  . Chloride 07/01/2018 96* 98 - 111 mmol/L Final  . CO2 07/01/2018 24  22 - 32 mmol/L Final  . Glucose, Bld 07/01/2018 417* 70 - 99 mg/dL Final  . BUN 07/01/2018 45* 8 - 23 mg/dL Final  . Creatinine, Ser 07/01/2018 3.32* 0.61 - 1.24 mg/dL Final  . Calcium 07/01/2018 8.9  8.9 - 10.3 mg/dL Final  . Total Protein 07/01/2018 6.8  6.5 - 8.1 g/dL Final  . Albumin 07/01/2018 4.0  3.5 - 5.0 g/dL Final  . AST 07/01/2018 23  15 - 41 U/L Final  . ALT 07/01/2018 34  0 - 44 U/L Final  . Alkaline Phosphatase 07/01/2018 143* 38 - 126 U/L Final  . Total Bilirubin 07/01/2018 0.9  0.3 - 1.2 mg/dL Final  . GFR calc non Af Amer 07/01/2018 18* >60 mL/min Final  . GFR calc Af Amer 07/01/2018 20* >60 mL/min Final   Comment: (NOTE) The eGFR has been calculated using the CKD EPI  equation. This  calculation has not been validated in all clinical situations. eGFR's persistently <60 mL/min signify possible Chronic Kidney Disease.   Georgiann Hahn gap 07/01/2018 13  5 - 15 Final   Performed at [facility], [address] [place], [place] [address]  . LDH 07/01/2018 109  98 - 192 U/L Final   Performed at [facility], [address], [place], [place] [address]  . Total Protein ELP 07/01/2018 6.1  6.0 - 8.5 g/dL Final  . Albumin ELP 07/01/2018 3.6  2.9 - 4.4 g/dL Final  . Alpha-1-Globulin 07/01/2018 0.2  0.0 - 0.4 g/dL Final  . Alpha-2-Globulin 07/01/2018 0.9  0.4 - 1.0 g/dL Final  . Beta Globulin 07/01/2018 0.9  0.7 - 1.3 g/dL Final  . Gamma Globulin 07/01/2018 0.5  0.4 - 1.8 g/dL Final  . M-Spike, % 07/01/2018 Not Observed  Not Observed g/dL Final  . SPE Interp. 07/01/2018 Comment   Final   Comment: (NOTE) The SPE pattern appears essentially unremarkable. Evidence of monoclonal protein is not apparent. Performed At: [facility] [place], [place] [id number] Rush Farmer MD BZ:[id number]   . Comment 07/01/2018 Comment   Final   Comment: (NOTE) Protein electrophoresis scan will follow via computer, mail, or courier delivery.   [name] GLOBULIN, TOTAL 07/01/2018 2.5  2.2 - 3.9 g/dL Corrected  . A/G Ratio 07/01/2018 1.4  0.7 - 1.7 Corrected  . IgG (Immunoglobin G), Serum 07/01/2018 423* 700 - 1,600 mg/dL Final  . IgA 07/01/2018 125  61 - 437 mg/dL Final  . IgM (Immunoglobulin M), Srm 07/01/2018 64  20 - 172 mg/dL Final   Comment: (NOTE) Performed At: [facility] [place], [place] [id number] Rush Farmer MD [id number]   . Kappa free light chain 07/01/2018 65.9* 3.3 - 19.4 mg/L Final  . Lamda free light chains 07/01/2018 30.6* 5.7 - 26.3 mg/L Final  . Kappa, lamda light chain ratio 07/01/2018 2.15* 0.26 - 1.65 Final   Comment: (NOTE) Performed At: [facility] [address] [place], [place] [id number] Rush Farmer MD [id number]   . Beta-2 Microglobulin 07/01/2018 7.4* 0.6 - 2.4 mg/L Final   Comment: (NOTE) Siemens Immulite 2000 Immunochemiluminometric assay (ICMA) Values obtained with different assay methods or kits cannot be used interchangeably. Results cannot be interpreted as absolute evidence of the presence or absence of malignant disease. Performed At: [facility] [place], [place] [id number] Rush Farmer MD IP:[id number]      Pathology Orders Placed This Encounter  Procedures  . CBC with Differential/Platelet    Standing Status:   Future    Standing Expiration Date:   07/09/2019  . Comprehensive metabolic panel    Standing Status:   Future    Standing Expiration Date:   07/09/2019  . Lactate dehydrogenase    Standing Status:   Future    Standing Expiration Date:   07/09/2019  . Protein electrophoresis, serum    Standing Status:   Future    Standing Expiration Date:   07/09/2019  . IgG, IgA, IgM    Standing Status:   Future    Standing Expiration Date:   07/09/2019  . Kappa/lambda light chains    Standing Status:   Future    Standing Expiration Date:   07/09/2019  . Beta 2 microglobulin, serum    Standing Status:   Future    Standing Expiration Date:   07/09/2019       Zoila Shutter MD

## 2018-07-22 ENCOUNTER — Other Ambulatory Visit (HOSPITAL_COMMUNITY): Payer: Self-pay | Admitting: *Deleted

## 2018-07-22 DIAGNOSIS — C9000 Multiple myeloma not having achieved remission: Secondary | ICD-10-CM

## 2018-07-22 MED ORDER — LENALIDOMIDE 2.5 MG PO CAPS: 2.5000 mg | ORAL_CAPSULE | Freq: Every day | ORAL | 4 refills | Status: DC

## 2018-07-22 NOTE — Telephone Encounter (Signed)
Chart reviewed, revlimid refilled.

## 2018-07-28 ENCOUNTER — Other Ambulatory Visit (HOSPITAL_COMMUNITY): Payer: Self-pay | Admitting: Internal Medicine

## 2018-07-28 DIAGNOSIS — G629 Polyneuropathy, unspecified: Secondary | ICD-10-CM

## 2018-07-29 ENCOUNTER — Telehealth (HOSPITAL_COMMUNITY): Payer: Self-pay | Admitting: Dietician

## 2018-07-29 NOTE — Telephone Encounter (Signed)
RD had been asked by [facility] provider to educate patient on DM diet due to steroid induced hyperglycemia.   RD called pt x2. Both times, got voicemail.   RD left message noteing while he was calling and asked him to call back if he would like to discuss diet changes to improve his BG control. Provided call back number.   Burtis Junes RD, LDN, CNSC Clinical Nutrition Available Tues-Sat via Pager: [phone] 07/29/2018 12:18 PM

## 2018-07-30 ENCOUNTER — Inpatient Hospital Stay (HOSPITAL_COMMUNITY): Payer: Medicare Other | Attending: Hematology

## 2018-07-30 DIAGNOSIS — Z79899 Other long term (current) drug therapy: Secondary | ICD-10-CM | POA: Insufficient documentation

## 2018-07-30 DIAGNOSIS — D649 Anemia, unspecified: Secondary | ICD-10-CM | POA: Insufficient documentation

## 2018-07-30 DIAGNOSIS — Z85528 Personal history of other malignant neoplasm of kidney: Secondary | ICD-10-CM | POA: Diagnosis not present

## 2018-07-30 DIAGNOSIS — E119 Type 2 diabetes mellitus without complications: Secondary | ICD-10-CM | POA: Insufficient documentation

## 2018-07-30 DIAGNOSIS — G629 Polyneuropathy, unspecified: Secondary | ICD-10-CM | POA: Insufficient documentation

## 2018-07-30 DIAGNOSIS — Z905 Acquired absence of kidney: Secondary | ICD-10-CM | POA: Insufficient documentation

## 2018-07-30 DIAGNOSIS — D696 Thrombocytopenia, unspecified: Secondary | ICD-10-CM | POA: Insufficient documentation

## 2018-07-30 DIAGNOSIS — C9000 Multiple myeloma not having achieved remission: Secondary | ICD-10-CM

## 2018-07-30 DIAGNOSIS — Z87891 Personal history of nicotine dependence: Secondary | ICD-10-CM | POA: Diagnosis not present

## 2018-07-30 DIAGNOSIS — Z9221 Personal history of antineoplastic chemotherapy: Secondary | ICD-10-CM | POA: Diagnosis not present

## 2018-07-30 LAB — CBC WITH DIFFERENTIAL/PLATELET
Basophils Absolute: 0.1 10*3/uL (ref 0.0–0.1)
Basophils Relative: 1 %
EOS ABS: 0.2 10*3/uL (ref 0.0–0.7)
Eosinophils Relative: 5 %
HCT: 30.9 % — ABNORMAL LOW (ref 39.0–52.0)
Hemoglobin: 10.4 g/dL — ABNORMAL LOW (ref 13.0–17.0)
Lymphocytes Relative: 19 %
Lymphs Abs: 1 10*3/uL (ref 0.7–4.0)
MCH: 32.9 pg (ref 26.0–34.0)
MCHC: 33.7 g/dL (ref 30.0–36.0)
MCV: 97.8 fL (ref 78.0–100.0)
MONOS PCT: 11 %
Monocytes Absolute: 0.6 10*3/uL (ref 0.1–1.0)
NEUTROS ABS: 3.4 10*3/uL (ref 1.7–7.7)
NEUTROS PCT: 64 %
PLATELETS: 109 10*3/uL — AB (ref 150–400)
RBC: 3.16 MIL/uL — ABNORMAL LOW (ref 4.22–5.81)
RDW: 13.9 % (ref 11.5–15.5)
WBC: 5.2 10*3/uL (ref 4.0–10.5)

## 2018-07-30 LAB — COMPREHENSIVE METABOLIC PANEL
ALT: 34 U/L (ref 0–44)
ANION GAP: 14 (ref 5–15)
AST: 24 U/L (ref 15–41)
Albumin: 3.9 g/dL (ref 3.5–5.0)
Alkaline Phosphatase: 138 U/L — ABNORMAL HIGH (ref 38–126)
BUN: 33 mg/dL — ABNORMAL HIGH (ref 8–23)
CALCIUM: 9.3 mg/dL (ref 8.9–10.3)
CHLORIDE: 97 mmol/L — AB (ref 98–111)
CO2: 24 mmol/L (ref 22–32)
Creatinine, Ser: 2.91 mg/dL — ABNORMAL HIGH (ref 0.61–1.24)
GFR calc Af Amer: 24 mL/min — ABNORMAL LOW (ref >60)
GFR, EST NON AFRICAN AMERICAN: 21 mL/min — AB (ref >60)
Glucose, Bld: 345 mg/dL — ABNORMAL HIGH (ref 70–99)
Potassium: 4 mmol/L (ref 3.5–5.1)
SODIUM: 135 mmol/L (ref 135–145)
Total Bilirubin: 0.7 mg/dL (ref 0.3–1.2)
Total Protein: 6.8 g/dL (ref 6.5–8.1)

## 2018-07-30 LAB — LACTATE DEHYDROGENASE: LDH: 104 U/L (ref 98–192)

## 2018-07-31 LAB — PROTEIN ELECTROPHORESIS, SERUM
A/G Ratio: 1.4 (ref 0.7–1.7)
ALPHA-2-GLOBULIN: 0.9 g/dL (ref 0.4–1.0)
Albumin ELP: 3.5 g/dL (ref 2.9–4.4)
Alpha-1-Globulin: 0.2 g/dL (ref 0.0–0.4)
BETA GLOBULIN: 1 g/dL (ref 0.7–1.3)
Gamma Globulin: 0.5 g/dL (ref 0.4–1.8)
Globulin, Total: 2.5 g/dL (ref 2.2–3.9)
Total Protein ELP: 6 g/dL (ref 6.0–8.5)

## 2018-07-31 LAB — IGG, IGA, IGM
IgA: 183 mg/dL (ref 61–437)
IgG (Immunoglobin G), Serum: 482 mg/dL — ABNORMAL LOW (ref 700–1600)
IgM (Immunoglobulin M), Srm: 57 mg/dL (ref 20–172)

## 2018-07-31 LAB — BETA 2 MICROGLOBULIN, SERUM: Beta-2 Microglobulin: 6.6 mg/L — ABNORMAL HIGH (ref 0.6–2.4)

## 2018-07-31 LAB — KAPPA/LAMBDA LIGHT CHAINS
Kappa free light chain: 78.7 mg/L — ABNORMAL HIGH (ref 3.3–19.4)
Kappa, lambda light chain ratio: 1.97 — ABNORMAL HIGH (ref 0.26–1.65)
Lambda free light chains: 39.9 mg/L — ABNORMAL HIGH (ref 5.7–26.3)

## 2018-08-01 NOTE — Telephone Encounter (Signed)
Chart reviewed, gabapentin refilled.

## 2018-08-06 ENCOUNTER — Inpatient Hospital Stay (HOSPITAL_BASED_OUTPATIENT_CLINIC_OR_DEPARTMENT_OTHER): Payer: Medicare Other | Admitting: Internal Medicine

## 2018-08-06 ENCOUNTER — Encounter (HOSPITAL_COMMUNITY): Payer: Self-pay | Admitting: Internal Medicine

## 2018-08-06 ENCOUNTER — Other Ambulatory Visit: Payer: Self-pay

## 2018-08-06 VITALS — BP 149/59 | HR 71 | Temp 97.7°F | Resp 18 | Wt 228.0 lb

## 2018-08-06 DIAGNOSIS — D649 Anemia, unspecified: Secondary | ICD-10-CM

## 2018-08-06 DIAGNOSIS — Z87891 Personal history of nicotine dependence: Secondary | ICD-10-CM | POA: Diagnosis not present

## 2018-08-06 DIAGNOSIS — D696 Thrombocytopenia, unspecified: Secondary | ICD-10-CM

## 2018-08-06 DIAGNOSIS — E119 Type 2 diabetes mellitus without complications: Secondary | ICD-10-CM | POA: Diagnosis not present

## 2018-08-06 DIAGNOSIS — C9000 Multiple myeloma not having achieved remission: Secondary | ICD-10-CM

## 2018-08-06 DIAGNOSIS — G629 Polyneuropathy, unspecified: Secondary | ICD-10-CM | POA: Diagnosis not present

## 2018-08-06 DIAGNOSIS — Z79899 Other long term (current) drug therapy: Secondary | ICD-10-CM | POA: Diagnosis not present

## 2018-08-06 DIAGNOSIS — Z85528 Personal history of other malignant neoplasm of kidney: Secondary | ICD-10-CM | POA: Diagnosis not present

## 2018-08-06 DIAGNOSIS — Z9221 Personal history of antineoplastic chemotherapy: Secondary | ICD-10-CM

## 2018-08-06 DIAGNOSIS — Z905 Acquired absence of kidney: Secondary | ICD-10-CM | POA: Diagnosis not present

## 2018-08-06 NOTE — Progress Notes (Signed)
Diagnosis Multiple myeloma not having achieved remission ([place]) - Plan: CBC with Differential/Platelet, Comprehensive metabolic panel, Lactate dehydrogenase, Protein electrophoresis, serum, IgG, IgA, IgM, Kappa/lambda light chains  Staging Cancer Staging No matching staging information was found for the patient.  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  He was being treated with Revlimid, Velcade and low-dose Decadron under the direction of Dr. Lebron Conners.  He underwent repeat bone marrow biopsy on Feb 27, 2018 at [facility] that showed no diagnostic or definitive evidence of residual plasma cell neoplasm with < 1% plasma cells.  He has been seen at [facility] 04/24/2018.    Review of records obtained from Dr. Alvie Heidelberg at [facility] indicate pt was last treated with Revlimid in May 2019 and Velcade 03/11/2018.  He does not desire transplant at this time.  He is recommended for Revllimid maintenance dose adjusted for renal function at 2.5 mg daily.  He will be seen at [facility] if he decides for transplant.    Labs done 07/30/2018 reviewed and showed WBC 5.2 HB 10.4 plts 109,000.  Chemistries WNL  K+ 4 and normal LFTs.  SPEP shows no M-spike.  K/L ratio 1.97.   Pt will RTC 09/2018 for follow-up and repeat labs.  Continue Revlimid 2.5 mg daily as directed.    2.  RI.  Cr 2.91.  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  Pt on modified dose of Revlimid and will watch kidney function closely.    3.  Anemia.  HB 10.4 which is stable.  Will repeat labs in 09/2018.    4.  Thrombocytopenia.  Plt count 109,000.  Pt will have repeat labs in 09/2018.    5.  DM.  BS 345.  Follow-up with PCP for management.  Pt no longer on Decadron.    6.  Neuropathy.  Pt on Neurontin.  I have discussed with him uncontrolled DM may also affect neuropathy.    Greater than 25 minutes spent with more than 50% spent in counseling and coordination of care.    Current Status:  Pt is seen today for follow-up.  He is here to  go over labs.      Multiple myeloma ([place])   08/29/2017 Initial Diagnosis    Multiple myeloma ([employer])    12/02/2017 -  Chemotherapy    The patient had bortezomib SQ (VELCADE) chemo injection 2.75 mg, 1.3 mg/m2 = 2.75 mg, Subcutaneous,  Once, 5 of 5 cycles Administration: 2.75 mg (12/10/2017), 2.75 mg (12/17/2017), 2.75 mg (12/31/2017), 2.75 mg (01/21/2018), 2.75 mg (02/18/2018), 2.75 mg (03/11/2018)  for chemotherapy treatment.       Problem List Patient Active Problem List   Diagnosis Date Noted  . Internal hemorrhoids [K64.8] 03/18/2018  . Hematochezia [K92.1] 12/18/2017  . Multiple myeloma ([place]) [C90.00] 08/29/2017  . CKD (chronic kidney disease) [N18.9] 08/29/2017  . Symptomatic anemia [D64.9]   . 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 AKI (acute kidney injury) ([place]) [N17.9] 07/24/2017  . Closed fracture of transverse process of lumbar vertebra, with delayed healing, subsequent encounter [S32.009G] 07/24/2017  . Renal failure [N19] 07/23/2017  . Hypercalcemia [E83.52] 07/23/2017  . Anemia [D64.9] 07/23/2017  . Thrombocytopenia ([place]) [D69.6] 07/23/2017  . Diabetes mellitus type 2 in obese ([place]) [E11.69, E66.9] 07/23/2017    Past Medical History Past Medical History:  Diagnosis Date  . Cancer ([place])    multiple myeloma  . Cancer of right kidney ([place])   . Cervical dystonia   . Diabetes mellitus  without complication [facility])     Past Surgical History Past Surgical History:  Procedure Laterality Date  . BONE MARROW BIOPSY    . CHOLECYSTECTOMY  2007  . COLONOSCOPY WITH PROPOFOL N/A 01/16/2018   Procedure: COLONOSCOPY WITH PROPOFOL;  Surgeon: Daneil Dolin, MD;  [facility];  Service: Endoscopy;  Laterality: N/A;  1:45pm  . ESOPHAGOGASTRODUODENOSCOPY (EGD) WITH PROPOFOL N/A 01/16/2018   Procedure: ESOPHAGOGASTRODUODENOSCOPY (EGD) WITH PROPOFOL;  Surgeon: Daneil Dolin, MD;  Location: [facility];  Service: Endoscopy;  Laterality: N/A;  . GIVENS CAPSULE STUDY N/A  05/12/2018   Procedure: GIVENS CAPSULE STUDY;  Surgeon: Daneil Dolin, MD;  Location: [facility];  Service: Endoscopy;  Laterality: N/A;  7:30am  . NEPHRECTOMY Right 1998   cancer    Family History Family History  Problem Relation Age of Onset  . Heart failure Mother 71  . Dementia Father   . Colon cancer Neg Hx      Social History  reports that he has never smoked. He quit smokeless tobacco use about 14 months ago.  His smokeless tobacco use included chew. He reports that he does not drink alcohol or use drugs.  Medications  Current Outpatient Medications:  .  acyclovir (ZOVIRAX) 400 MG tablet, Take 1 tablet (400 mg total) by mouth 2 (two) times daily., Disp: 60 tablet, Rfl: 3 .  allopurinol (ZYLOPRIM) 300 MG tablet, Take 300 mg by mouth every morning., Disp: , Rfl:  .  cyclobenzaprine (FLEXERIL) 10 MG tablet, Take 10 mg by mouth at bedtime., Disp: , Rfl:  .  furosemide (LASIX) 40 MG tablet, Take 40 mg by mouth every Monday, Wednesday, and Friday. , Disp: , Rfl:  .  gabapentin (NEURONTIN) 300 MG capsule, TAKE 1 CAPSULE BY MOUTH AT BEDTIME, Disp: 30 capsule, Rfl: 4 .  glipiZIDE (GLUCOTROL XL) 5 MG 24 hr tablet, Take 10 mg by mouth daily with breakfast. , Disp: , Rfl:  .  lenalidomide (REVLIMID) 2.5 MG capsule, Take 1 capsule (2.5 mg total) by mouth daily., Disp: 30 capsule, Rfl: 4 .  Multiple Vitamins-Minerals (CENTRUM SILVER 50+MEN) TABS, Take 1 tablet by mouth every morning., Disp: , Rfl:  .  pravastatin (PRAVACHOL) 20 MG tablet, Take 20 mg by mouth at bedtime. , Disp: , Rfl:  .  sodium bicarbonate 650 MG tablet, Take 1 tablet (650 mg total) by mouth 2 (two) times daily. (Patient taking differently: Take 650 mg by mouth 3 (three) times daily. ), Disp: 60 tablet, Rfl: 1 No current facility-administered medications for this visit.   Facility-Administered Medications Ordered in Other Visits:  .  0.9 %  sodium chloride infusion, 250 mL, Intravenous, Once, Twana First,  MD  Allergies Patient has no known allergies.  Review of Systems Review of Systems - Oncology ROS negative other than neuropathy   Physical Exam  Vitals Wt Readings from Last 3 Encounters:  08/06/18 228 lb (103.4 kg)  07/08/18 226 lb 1.6 oz (102.6 kg)  06/18/18 229 lb 12.8 oz (104.2 kg)   Temp Readings from Last 3 Encounters:  08/06/18 97.7 F (36.5 C) (Oral)  07/08/18 98.3 F (36.8 C) (Oral)  06/18/18 (!) 97 F (36.1 C) (Oral)   BP Readings from Last 3 Encounters:  08/06/18 (!) 149/59  07/08/18 (!) 123/93  06/18/18 (!) 159/77   Pulse Readings from Last 3 Encounters:  08/06/18 71  07/08/18 83  06/18/18 99   Constitutional: Well-developed, well-nourished, and in no distress.   HENT: Head: Normocephalic  and atraumatic.  Mouth/Throat: No oropharyngeal exudate. Mucosa moist. Eyes: Pupils are equal, round, and reactive to light. Conjunctivae are normal. No scleral icterus.  Neck: Normal range of motion. Neck supple. No JVD present.  Cardiovascular: Normal rate, regular rhythm and normal heart sounds.  Exam reveals no gallop and no friction rub.   No murmur heard. Pulmonary/Chest: Effort normal and breath sounds normal. No respiratory distress. No wheezes.No rales.  Abdominal: Soft. Bowel sounds are normal. No distension. There is no tenderness. There is no guarding.  Musculoskeletal: No edema or tenderness.  Lymphadenopathy: No cervical, axillary or supraclavicular adenopathy.  Neurological: Alert and oriented to person, place, and time. No cranial nerve deficit.  Skin: Skin is warm and dry. No rash noted. No erythema. No pallor.  Psychiatric: Affect and judgment normal.   Labs No visits with results within 3 Day(s) from this visit.  Latest known visit with results is:  Appointment on 07/30/2018  Component Date Value Ref Range Status  . WBC 07/30/2018 5.2  4.0 - 10.5 K/uL Final  . RBC 07/30/2018 3.16* 4.22 - 5.81 MIL/uL Final  . Hemoglobin 07/30/2018 10.4* 13.0 -  17.0 g/dL Final  . HCT 07/30/2018 30.9* 39.0 - 52.0 % Final  . MCV 07/30/2018 97.8  78.0 - 100.0 fL Final  . MCH 07/30/2018 32.9  26.0 - 34.0 pg Final  . MCHC 07/30/2018 33.7  30.0 - 36.0 g/dL Final  . RDW 07/30/2018 13.9  11.5 - 15.5 % Final  . Platelets 07/30/2018 109* 150 - 400 K/uL Final   CONSISTENT WITH PREVIOUS RESULT  . Neutrophils Relative % 07/30/2018 64  % Final  . Neutro Abs 07/30/2018 3.4  1.7 - 7.7 K/uL Final  . Lymphocytes Relative 07/30/2018 19  % Final  . Lymphs Abs 07/30/2018 1.0  0.7 - 4.0 K/uL Final  . Monocytes Relative 07/30/2018 11  % Final  . Monocytes Absolute 07/30/2018 0.6  0.1 - 1.0 K/uL Final  . Eosinophils Relative 07/30/2018 5  % Final  . Eosinophils Absolute 07/30/2018 0.2  0.0 - 0.7 K/uL Final  . Basophils Relative 07/30/2018 1  % Final  . Basophils Absolute 07/30/2018 0.1  0.0 - 0.1 K/uL Final   Performed at [facility], [address] [place], [place] [address]  . Sodium 07/30/2018 135  135 - 145 mmol/L Final  . Potassium 07/30/2018 4.0  3.5 - 5.1 mmol/L Final  . Chloride 07/30/2018 97* 98 - 111 mmol/L Final  . CO2 07/30/2018 24  22 - 32 mmol/L Final  . Glucose, Bld 07/30/2018 345* 70 - 99 mg/dL Final  . BUN 07/30/2018 33* 8 - 23 mg/dL Final  . Creatinine, Ser 07/30/2018 2.91* 0.61 - 1.24 mg/dL Final  . Calcium 07/30/2018 9.3  8.9 - 10.3 mg/dL Final  . Total Protein 07/30/2018 6.8  6.5 - 8.1 g/dL Final  . Albumin 07/30/2018 3.9  3.5 - 5.0 g/dL Final  . AST 07/30/2018 24  15 - 41 U/L Final  . ALT 07/30/2018 34  0 - 44 U/L Final  . Alkaline Phosphatase 07/30/2018 138* 38 - 126 U/L Final  . Total Bilirubin 07/30/2018 0.7  0.3 - 1.2 mg/dL Final  . GFR calc non Af Amer 07/30/2018 21* >60 mL/min Final  . GFR calc Af Amer 07/30/2018 24* >60 mL/min Final   Comment: (NOTE) The eGFR has been calculated using the CKD EPI equation. This calculation has not been validated in all clinical situations. eGFR's persistently <60 mL/min signify possible Chronic  Kidney Disease.   [name]  Anion gap 07/30/2018 14  5 - 15 Final   Performed at [facility], [address] [place], [place] [address]  . LDH 07/30/2018 104  98 - 192 U/L Final   Performed at [facility], [address] [place], [place] [address]  . Total Protein ELP 07/30/2018 6.0  6.0 - 8.5 g/dL Final  . Albumin ELP 07/30/2018 3.5  2.9 - 4.4 g/dL Final  . Alpha-1-Globulin 07/30/2018 0.2  0.0 - 0.4 g/dL Final  . Alpha-2-Globulin 07/30/2018 0.9  0.4 - 1.0 g/dL Final  . Beta Globulin 07/30/2018 1.0  0.7 - 1.3 g/dL Final  . Gamma Globulin 07/30/2018 0.5  0.4 - 1.8 g/dL Final  . M-Spike, % 07/30/2018 Not Observed  Not Observed g/dL Final  . SPE Interp. 07/30/2018 Comment   Final   Comment: (NOTE) The SPE pattern appears essentially unremarkable. Evidence of monoclonal protein is not apparent. Performed At: [facility] [place], [place] [id number] Rush Farmer MD [id number]   . Comment 07/30/2018 Comment   Final   Comment: (NOTE) Protein electrophoresis scan will follow via computer, mail, or courier delivery.   [name] GLOBULIN, TOTAL 07/30/2018 2.5  2.2 - 3.9 g/dL Corrected  . A/G Ratio 07/30/2018 1.4  0.7 - 1.7 Corrected  . IgG (Immunoglobin G), Serum 07/30/2018 482* 700 - 1,600 mg/dL Final  . IgA 07/30/2018 183  61 - 437 mg/dL Final  . IgM (Immunoglobulin M), Srm 07/30/2018 57  20 - 172 mg/dL Final   Comment: (NOTE) Performed At: [facility] [place], [place] [id number] Rush Farmer MD FY:[id number]   . Kappa free light chain 07/30/2018 78.7* 3.3 - 19.4 mg/L Final  . Lamda free light chains 07/30/2018 39.9* 5.7 - 26.3 mg/L Final  . Kappa, lamda light chain ratio 07/30/2018 1.97* 0.26 - 1.65 Final   Comment: (NOTE) Performed At: [facility] [address] [place], [place] [id number] Rush Farmer MD [id number]   . Beta-2 Microglobulin 07/30/2018 6.6* 0.6 - 2.4 mg/L Final   Comment: (NOTE) Siemens Immulite 2000  Immunochemiluminometric assay (ICMA) Values obtained with different assay methods or kits cannot be used interchangeably. Results cannot be interpreted as absolute evidence of the presence or absence of malignant disease. Performed At: [facility] [place], [place] [id number] Rush Farmer MD TO:[id number]      Pathology Orders Placed This Encounter  Procedures  . CBC with Differential/Platelet    Standing Status:   Future    Standing Expiration Date:   08/07/2019  . Comprehensive metabolic panel    Standing Status:   Future    Standing Expiration Date:   08/07/2019  . Lactate dehydrogenase    Standing Status:   Future    Standing Expiration Date:   08/07/2019  . Protein electrophoresis, serum    Standing Status:   Future    Standing Expiration Date:   08/07/2019  . IgG, IgA, IgM    Standing Status:   Future    Standing Expiration Date:   08/07/2019  . Kappa/lambda light chains    Standing Status:   Future    Standing Expiration Date:   08/07/2019       Zoila Shutter MD

## 2018-08-19 DIAGNOSIS — Z23 Encounter for immunization: Secondary | ICD-10-CM | POA: Diagnosis not present

## 2018-08-25 ENCOUNTER — Other Ambulatory Visit (HOSPITAL_COMMUNITY): Payer: Self-pay | Admitting: *Deleted

## 2018-08-25 DIAGNOSIS — C9000 Multiple myeloma not having achieved remission: Secondary | ICD-10-CM

## 2018-08-25 MED ORDER — LENALIDOMIDE 2.5 MG PO CAPS: 2.5000 mg | ORAL_CAPSULE | Freq: Every day | ORAL | 0 refills | Status: DC

## 2018-08-25 NOTE — Telephone Encounter (Signed)
Chart reviewed, per Dr. Walden Field last office note, Revlimid refilled.

## 2018-08-28 DIAGNOSIS — Z79899 Other long term (current) drug therapy: Secondary | ICD-10-CM | POA: Diagnosis not present

## 2018-08-28 DIAGNOSIS — E1129 Type 2 diabetes mellitus with other diabetic kidney complication: Secondary | ICD-10-CM | POA: Diagnosis not present

## 2018-08-28 DIAGNOSIS — N183 Chronic kidney disease, stage 3 (moderate): Secondary | ICD-10-CM | POA: Diagnosis not present

## 2018-08-28 DIAGNOSIS — I10 Essential (primary) hypertension: Secondary | ICD-10-CM | POA: Diagnosis not present

## 2018-08-28 DIAGNOSIS — E559 Vitamin D deficiency, unspecified: Secondary | ICD-10-CM | POA: Diagnosis not present

## 2018-08-28 DIAGNOSIS — R809 Proteinuria, unspecified: Secondary | ICD-10-CM | POA: Diagnosis not present

## 2018-08-28 DIAGNOSIS — D509 Iron deficiency anemia, unspecified: Secondary | ICD-10-CM | POA: Diagnosis not present

## 2018-09-02 DIAGNOSIS — E1122 Type 2 diabetes mellitus with diabetic chronic kidney disease: Secondary | ICD-10-CM | POA: Diagnosis not present

## 2018-09-02 DIAGNOSIS — N184 Chronic kidney disease, stage 4 (severe): Secondary | ICD-10-CM | POA: Diagnosis not present

## 2018-09-03 DIAGNOSIS — C9000 Multiple myeloma not having achieved remission: Secondary | ICD-10-CM | POA: Diagnosis not present

## 2018-09-03 DIAGNOSIS — N184 Chronic kidney disease, stage 4 (severe): Secondary | ICD-10-CM | POA: Diagnosis not present

## 2018-09-03 DIAGNOSIS — E872 Acidosis: Secondary | ICD-10-CM | POA: Diagnosis not present

## 2018-09-03 DIAGNOSIS — I10 Essential (primary) hypertension: Secondary | ICD-10-CM | POA: Diagnosis not present

## 2018-09-03 DIAGNOSIS — D638 Anemia in other chronic diseases classified elsewhere: Secondary | ICD-10-CM | POA: Diagnosis not present

## 2018-09-11 ENCOUNTER — Other Ambulatory Visit (HOSPITAL_COMMUNITY): Payer: Self-pay | Admitting: Internal Medicine

## 2018-09-11 DIAGNOSIS — C9000 Multiple myeloma not having achieved remission: Secondary | ICD-10-CM

## 2018-09-22 ENCOUNTER — Other Ambulatory Visit (HOSPITAL_COMMUNITY): Payer: Self-pay | Admitting: *Deleted

## 2018-09-22 DIAGNOSIS — C9000 Multiple myeloma not having achieved remission: Secondary | ICD-10-CM

## 2018-09-22 MED ORDER — LENALIDOMIDE 2.5 MG PO CAPS: 2.5000 mg | ORAL_CAPSULE | Freq: Every day | ORAL | 0 refills | Status: DC

## 2018-09-22 NOTE — Telephone Encounter (Signed)
Chart reviewed, per Dr. Walden Field last office note, revlimid refilled.

## 2018-09-30 ENCOUNTER — Inpatient Hospital Stay (HOSPITAL_COMMUNITY): Payer: Medicare Other | Attending: Hematology

## 2018-09-30 DIAGNOSIS — D649 Anemia, unspecified: Secondary | ICD-10-CM | POA: Diagnosis not present

## 2018-09-30 DIAGNOSIS — Z85528 Personal history of other malignant neoplasm of kidney: Secondary | ICD-10-CM | POA: Diagnosis not present

## 2018-09-30 DIAGNOSIS — I82402 Acute embolism and thrombosis of unspecified deep veins of left lower extremity: Secondary | ICD-10-CM | POA: Insufficient documentation

## 2018-09-30 DIAGNOSIS — Z7901 Long term (current) use of anticoagulants: Secondary | ICD-10-CM | POA: Insufficient documentation

## 2018-09-30 DIAGNOSIS — Z9221 Personal history of antineoplastic chemotherapy: Secondary | ICD-10-CM | POA: Insufficient documentation

## 2018-09-30 DIAGNOSIS — D696 Thrombocytopenia, unspecified: Secondary | ICD-10-CM | POA: Diagnosis not present

## 2018-09-30 DIAGNOSIS — Z905 Acquired absence of kidney: Secondary | ICD-10-CM | POA: Diagnosis not present

## 2018-09-30 DIAGNOSIS — C9000 Multiple myeloma not having achieved remission: Secondary | ICD-10-CM | POA: Insufficient documentation

## 2018-09-30 LAB — CBC WITH DIFFERENTIAL/PLATELET
ABS IMMATURE GRANULOCYTES: 0.03 10*3/uL (ref 0.00–0.07)
BASOS PCT: 1 %
Basophils Absolute: 0.1 10*3/uL (ref 0.0–0.1)
EOS PCT: 3 %
Eosinophils Absolute: 0.1 10*3/uL (ref 0.0–0.5)
HCT: 29.5 % — ABNORMAL LOW (ref 39.0–52.0)
HEMOGLOBIN: 9.5 g/dL — AB (ref 13.0–17.0)
Immature Granulocytes: 1 %
LYMPHS PCT: 22 %
Lymphs Abs: 1.1 10*3/uL (ref 0.7–4.0)
MCH: 32.2 pg (ref 26.0–34.0)
MCHC: 32.2 g/dL (ref 30.0–36.0)
MCV: 100 fL (ref 80.0–100.0)
MONO ABS: 0.4 10*3/uL (ref 0.1–1.0)
Monocytes Relative: 8 %
NEUTROS ABS: 3.2 10*3/uL (ref 1.7–7.7)
Neutrophils Relative %: 65 %
Platelets: 122 10*3/uL — ABNORMAL LOW (ref 150–400)
RBC: 2.95 MIL/uL — ABNORMAL LOW (ref 4.22–5.81)
RDW: 14.8 % (ref 11.5–15.5)
WBC: 5 10*3/uL (ref 4.0–10.5)
nRBC: 0 % (ref 0.0–0.2)

## 2018-09-30 LAB — COMPREHENSIVE METABOLIC PANEL
ALK PHOS: 117 U/L (ref 38–126)
ALT: 29 U/L (ref 0–44)
AST: 23 U/L (ref 15–41)
Albumin: 3.9 g/dL (ref 3.5–5.0)
Anion gap: 11 (ref 5–15)
BUN: 35 mg/dL — ABNORMAL HIGH (ref 8–23)
CO2: 21 mmol/L — ABNORMAL LOW (ref 22–32)
CREATININE: 2.99 mg/dL — AB (ref 0.61–1.24)
Calcium: 8.8 mg/dL — ABNORMAL LOW (ref 8.9–10.3)
Chloride: 104 mmol/L (ref 98–111)
GFR, EST AFRICAN AMERICAN: 24 mL/min — AB (ref >60)
GFR, EST NON AFRICAN AMERICAN: 20 mL/min — AB (ref >60)
Glucose, Bld: 290 mg/dL — ABNORMAL HIGH (ref 70–99)
Potassium: 4.3 mmol/L (ref 3.5–5.1)
Sodium: 136 mmol/L (ref 135–145)
Total Bilirubin: 0.6 mg/dL (ref 0.3–1.2)
Total Protein: 7.1 g/dL (ref 6.5–8.1)

## 2018-09-30 LAB — LACTATE DEHYDROGENASE: LDH: 123 U/L (ref 98–192)

## 2018-10-01 LAB — KAPPA/LAMBDA LIGHT CHAINS
Kappa free light chain: 98.4 mg/L — ABNORMAL HIGH (ref 3.3–19.4)
Kappa, lambda light chain ratio: 1.9 — ABNORMAL HIGH (ref 0.26–1.65)
Lambda free light chains: 51.8 mg/L — ABNORMAL HIGH (ref 5.7–26.3)

## 2018-10-01 LAB — PROTEIN ELECTROPHORESIS, SERUM
A/G Ratio: 1.6 (ref 0.7–1.7)
ALPHA-1-GLOBULIN: 0.2 g/dL (ref 0.0–0.4)
Albumin ELP: 3.9 g/dL (ref 2.9–4.4)
Alpha-2-Globulin: 0.8 g/dL (ref 0.4–1.0)
Beta Globulin: 1.1 g/dL (ref 0.7–1.3)
Gamma Globulin: 0.5 g/dL (ref 0.4–1.8)
Globulin, Total: 2.5 g/dL (ref 2.2–3.9)
M-Spike, %: 0.2 g/dL — ABNORMAL HIGH
Total Protein ELP: 6.4 g/dL (ref 6.0–8.5)

## 2018-10-01 LAB — IGG, IGA, IGM
IGM (IMMUNOGLOBULIN M), SRM: 47 mg/dL (ref 20–172)
IgA: 306 mg/dL (ref 61–437)
IgG (Immunoglobin G), Serum: 622 mg/dL — ABNORMAL LOW (ref 700–1600)

## 2018-10-07 ENCOUNTER — Ambulatory Visit (HOSPITAL_COMMUNITY)
Admission: RE | Admit: 2018-10-07 | Discharge: 2018-10-07 | Disposition: A | Discharge status: Home / Self Care | Payer: Medicare Other | Source: Ambulatory Visit | Attending: Internal Medicine | Admitting: Internal Medicine

## 2018-10-07 ENCOUNTER — Inpatient Hospital Stay (HOSPITAL_BASED_OUTPATIENT_CLINIC_OR_DEPARTMENT_OTHER): Payer: Medicare Other | Admitting: Internal Medicine

## 2018-10-07 ENCOUNTER — Encounter (HOSPITAL_COMMUNITY): Payer: Self-pay | Admitting: Internal Medicine

## 2018-10-07 ENCOUNTER — Other Ambulatory Visit: Payer: Self-pay

## 2018-10-07 VITALS — BP 142/66 | HR 79 | Temp 98.4°F | Resp 14 | Wt 224.6 lb

## 2018-10-07 DIAGNOSIS — C9000 Multiple myeloma not having achieved remission: Secondary | ICD-10-CM

## 2018-10-07 DIAGNOSIS — Z7901 Long term (current) use of anticoagulants: Secondary | ICD-10-CM

## 2018-10-07 DIAGNOSIS — Z9221 Personal history of antineoplastic chemotherapy: Secondary | ICD-10-CM | POA: Diagnosis not present

## 2018-10-07 DIAGNOSIS — I82412 Acute embolism and thrombosis of left femoral vein: Secondary | ICD-10-CM | POA: Insufficient documentation

## 2018-10-07 DIAGNOSIS — I82402 Acute embolism and thrombosis of unspecified deep veins of left lower extremity: Secondary | ICD-10-CM | POA: Diagnosis not present

## 2018-10-07 DIAGNOSIS — M7989 Other specified soft tissue disorders: Secondary | ICD-10-CM

## 2018-10-07 DIAGNOSIS — D696 Thrombocytopenia, unspecified: Secondary | ICD-10-CM | POA: Diagnosis not present

## 2018-10-07 DIAGNOSIS — I82462 Acute embolism and thrombosis of left calf muscular vein: Secondary | ICD-10-CM | POA: Diagnosis not present

## 2018-10-07 DIAGNOSIS — I82432 Acute embolism and thrombosis of left popliteal vein: Secondary | ICD-10-CM | POA: Diagnosis not present

## 2018-10-07 DIAGNOSIS — Z85528 Personal history of other malignant neoplasm of kidney: Secondary | ICD-10-CM

## 2018-10-07 DIAGNOSIS — D649 Anemia, unspecified: Secondary | ICD-10-CM | POA: Diagnosis not present

## 2018-10-07 DIAGNOSIS — Z905 Acquired absence of kidney: Secondary | ICD-10-CM

## 2018-10-07 MED ORDER — APIXABAN 2.5 MG PO TABS: ORAL_TABLET | ORAL | 2 refills | Status: DC

## 2018-10-07 MED ORDER — ELIQUIS 5 MG VTE STARTER PACK: ORAL_TABLET | ORAL | 0 refills | Status: DC

## 2018-10-07 NOTE — Patient Instructions (Signed)
[facility]  Discharge Instructions: You saw Dr. Higgs today                               _______________________________________________________________  Thank you for choosing [facility] at [facility] to provide your oncology and hematology care.  To afford each patient quality time with our providers, please arrive at least 15 minutes before your scheduled appointment.  You need to re-schedule your appointment if you arrive 10 or more minutes late.  We strive to give you quality time with our providers, and arriving late affects you and other patients whose appointments are after yours.  Also, if you no show three or more times for appointments you may be dismissed from the clinic.  Again, thank you for choosing [facility] at [facility]. Our hope is that these requests will allow you access to exceptional care and in a timely manner. _______________________________________________________________  If you have questions after your visit, please contact our office at [phone] between the hours of 8:30 a.m. and 5:00 p.m. Voicemails left after 4:30 p.m. will not be returned until the following business day. _______________________________________________________________  For prescription refill requests, have your pharmacy contact our office. _______________________________________________________________  Recommendations made by the consultant and any test results will be sent to your referring physician. _______________________________________________________________ 

## 2018-10-07 NOTE — Progress Notes (Signed)
Diagnosis Multiple myeloma not having achieved remission ([place]) - Plan: US Venous Img Lower Unilateral Left, CBC with Differential/Platelet, Comprehensive metabolic panel, Lactate dehydrogenase, Protein electrophoresis, serum, IgG, IgA, IgM, Kappa/lambda light chains  Left leg swelling - Plan: US Venous Img Lower Unilateral Left, CBC with Differential/Platelet, Comprehensive metabolic panel, Lactate dehydrogenase, Protein electrophoresis, serum, IgG, IgA, IgM, Kappa/lambda light chains  Staging Cancer Staging No matching staging information was found for the patient.  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  He was being treated with Revlimid, Velcade and low-dose Decadron under the direction of Dr. Lebron Conners.  He underwent repeat bone marrow biopsy on Feb 27, 2018 at [facility] that showed no diagnostic or definitive evidence of residual plasma cell neoplasm with < 1% plasma cells.  He has been seen at [facility] 04/24/2018.    Review of records obtained from Dr. Alvie Heidelberg at [facility] indicate pt was last treated with Revlimid in May 2019 and Velcade 03/11/2018.  He does not desire transplant at this time.  He is recommended for Revllimid maintenance dose adjusted for renal function at 2.5 mg daily.  He will be seen at [facility] if he decides for transplant.    Labs done 09/30/2018 reviewed and showed WBC 5 HB 9.5 Plts 122,000.  Chemistries WNL with K+ 4.3  Cr 2.99 and normal LFTs.  SPEP shows minimal M-spike measuring 0.2 g/dl.  FLC ratio stable at 1.9.  Pt will continue Revlimid at present dose.  I have discussed with him minimal increase in M-spike. He will have repeat labs in 1 month for follow-up.  Previous SPEP has been negative.    2.  Left LE DVT.  Pt reported left leg swelling today.  Stat bilateral LE doppler done today 10/07/2018  reviewed and showed LLE DVT.  I have discussed with pt options of anticoagulation with lovenox vs coumadin vs Doac therapy.  He will be given trial of Eliquis 5 mg po bid for 7  days starter pack sent to pharmacy.  He will then begin Eliquis 2.5 mg po bid. Rx sent to pharmacy.   He will be monitored closely due to Renal insufficiency.  He is advised to notify the office if he has any bleeding issues prior to his next visit.  Pt will RTC 10/20/2018 with CBC and CMP.    3.  RI.  Cr 2.99.  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  Pt on modified dose of Revlimid and will watch kidney function closely.  Eliquis dosing modified due to RI.    4.  Anemia.  HB 9.5. Will repeat labs on 10/20/2018.    5.  Thrombocytopenia.  Plt count improved at 122,000.  Will repeat labs on 10/20/2018.    5.  DM.  BS 290.  Follow-up with PCP for management.  Pt no longer on Decadron.    6.  Neuropathy.  Pt on Neurontin.  I have discussed with him uncontrolled DM may also affect neuropathy.    25 minutes spent with more than 50% spent in counseling and coordination of care.    Current Status:  Pt is seen today for follow-up.  He is here to go over labs.  He reports some left leg swelling that improves with elevating leg.      Multiple myeloma ([place])   08/29/2017 Initial Diagnosis    Multiple myeloma ([place])    12/02/2017 -  Chemotherapy    The patient had bortezomib SQ (VELCADE) chemo injection 2.75  mg, 1.3 mg/m2 = 2.75 mg, Subcutaneous,  Once, 5 of 5 cycles Administration: 2.75 mg (12/10/2017), 2.75 mg (12/17/2017), 2.75 mg (12/31/2017), 2.75 mg (01/21/2018), 2.75 mg (02/18/2018), 2.75 mg (03/11/2018)  for chemotherapy treatment.       Problem List Patient Active Problem List   Diagnosis Date Noted  . Internal hemorrhoids [K64.8] 03/18/2018  . Hematochezia [K92.1] 12/18/2017  . Multiple myeloma ([place]) [C90.00] 08/29/2017  . CKD (chronic kidney disease) [N18.9] 08/29/2017  . Symptomatic anemia [D64.9]   . 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 AKI (acute kidney injury) ([place]) [N17.9] 07/24/2017  . Closed fracture of transverse process of lumbar vertebra, with  delayed healing, subsequent encounter [S32.009G] 07/24/2017  . Renal failure [N19] 07/23/2017  . Hypercalcemia [E83.52] 07/23/2017  . Anemia [D64.9] 07/23/2017  . Thrombocytopenia ([place]) [D69.6] 07/23/2017  . Diabetes mellitus type 2 in obese ([place]) [E11.69, E66.9] 07/23/2017    Past Medical History Past Medical History:  Diagnosis Date  . Cancer ([place])    multiple myeloma  . Cancer of right kidney ([place])   . Cervical dystonia   . Diabetes mellitus without complication [facility])     Past Surgical History Past Surgical History:  Procedure Laterality Date  . BONE MARROW BIOPSY    . CHOLECYSTECTOMY  2007  . COLONOSCOPY WITH PROPOFOL N/A 01/16/2018   Procedure: COLONOSCOPY WITH PROPOFOL;  Surgeon: Daneil Dolin, MD;  [facility];  Service: Endoscopy;  Laterality: N/A;  1:45pm  . ESOPHAGOGASTRODUODENOSCOPY (EGD) WITH PROPOFOL N/A 01/16/2018   Procedure: ESOPHAGOGASTRODUODENOSCOPY (EGD) WITH PROPOFOL;  Surgeon: Daneil Dolin, MD;  Location: [facility];  Service: Endoscopy;  Laterality: N/A;  . GIVENS CAPSULE STUDY N/A 05/12/2018   Procedure: GIVENS CAPSULE STUDY;  Surgeon: Daneil Dolin, MD;  Location: [facility];  Service: Endoscopy;  Laterality: N/A;  7:30am  . NEPHRECTOMY Right 1998   cancer    Family History Family History  Problem Relation Age of Onset  . Heart failure Mother 35  . Dementia Father   . Colon cancer Neg Hx      Social History  reports that he has never smoked. He quit smokeless tobacco use about 16 months ago.  His smokeless tobacco use included chew. He reports that he does not drink alcohol or use drugs.  Medications  Current Outpatient Medications:  .  acyclovir (ZOVIRAX) 400 MG tablet, TAKE 1 TABLET BY MOUTH TWICE DAILY, Disp: 60 tablet, Rfl: 0 .  allopurinol (ZYLOPRIM) 300 MG tablet, Take 300 mg by mouth every morning., Disp: , Rfl:  .  apixaban (ELIQUIS) 2.5 MG TABS tablet, After completion of starter pack on D8 begin 2.5 po  mg bid,  Disp: 60 tablet, Rfl: 2 .  B-D UF III MINI PEN NEEDLES 31G X 5 MM MISC, , Disp: , Rfl: 4 .  cyclobenzaprine (FLEXERIL) 10 MG tablet, Take 10 mg by mouth at bedtime., Disp: , Rfl:  .  ELIQUIS STARTER PACK (ELIQUIS STARTER PACK) 5 MG TABS, Take as directed on package: start with two-36m tablets twice daily for 7 days. On day 8, switch to one-557mtablet twice daily., Disp: 1 each, Rfl: 0 .  furosemide (LASIX) 40 MG tablet, Take 40 mg by mouth every Monday, Wednesday, and Friday. , Disp: , Rfl:  .  gabapentin (NEURONTIN) 300 MG capsule, TAKE 1 CAPSULE BY MOUTH AT BEDTIME, Disp: 30 capsule, Rfl: 4 .  glipiZIDE (GLUCOTROL XL) 5 MG 24 hr tablet, Take 10 mg by mouth daily with breakfast. ,  Disp: , Rfl:  .  lenalidomide (REVLIMID) 2.5 MG capsule, Take 1 capsule (2.5 mg total) by mouth daily., Disp: 30 capsule, Rfl: 0 .  Multiple Vitamins-Minerals (CENTRUM SILVER 50+MEN) TABS, Take 1 tablet by mouth every morning., Disp: , Rfl:  .  pravastatin (PRAVACHOL) 20 MG tablet, Take 20 mg by mouth at bedtime. , Disp: , Rfl:  .  sodium bicarbonate 650 MG tablet, Take 1 tablet (650 mg total) by mouth 2 (two) times daily. (Patient taking differently: Take 650 mg by mouth 3 (three) times daily. ), Disp: 60 tablet, Rfl: 1 No current facility-administered medications for this visit.   Facility-Administered Medications Ordered in Other Visits:  .  0.9 %  sodium chloride infusion, 250 mL, Intravenous, Once, Twana First, MD  Allergies Patient has no known allergies.  Review of Systems Review of Systems - Oncology ROS negative other than LLE swelling   Physical Exam  Vitals Wt Readings from Last 3 Encounters:  10/07/18 224 lb 9.6 oz (101.9 kg)  08/06/18 228 lb (103.4 kg)  07/08/18 226 lb 1.6 oz (102.6 kg)   Temp Readings from Last 3 Encounters:  10/07/18 98.4 F (36.9 C) (Oral)  08/06/18 97.7 F (36.5 C) (Oral)  07/08/18 98.3 F (36.8 C) (Oral)   BP Readings from Last 3 Encounters:  10/07/18 (!) 142/66   08/06/18 (!) 149/59  07/08/18 (!) 123/93   Pulse Readings from Last 3 Encounters:  10/07/18 79  08/06/18 71  07/08/18 83   Constitutional: Well-developed, well-nourished, and in no distress.   HENT: Head: Normocephalic and atraumatic.  Mouth/Throat: No oropharyngeal exudate. Mucosa moist. Eyes: Pupils are equal, round, and reactive to light. Conjunctivae are normal. No scleral icterus.  Neck: Normal range of motion. Neck supple. No JVD present.  Cardiovascular: Normal rate, regular rhythm and normal heart sounds.  Exam reveals no gallop and no friction rub.   No murmur heard. Pulmonary/Chest: Effort normal and breath sounds normal. No respiratory distress. No wheezes.No rales.  Abdominal: Soft. Bowel sounds are normal. No distension. There is no tenderness. There is no guarding.  Musculoskeletal: Left LE swelling and erythema. Lymphadenopathy: No cervical, axillary or supraclavicular adenopathy.  Neurological: Alert and oriented to person, place, and time. No cranial nerve deficit.  Skin: Skin is warm and dry. No rash noted. No erythema. No pallor.  Psychiatric: Affect and judgment normal.   Labs No visits with results within 3 Day(s) from this visit.  Latest known visit with results is:  Appointment on 09/30/2018  Component Date Value Ref Range Status  . WBC 09/30/2018 5.0  4.0 - 10.5 K/uL Final  . RBC 09/30/2018 2.95* 4.22 - 5.81 MIL/uL Final  . Hemoglobin 09/30/2018 9.5* 13.0 - 17.0 g/dL Final  . HCT 09/30/2018 29.5* 39.0 - 52.0 % Final  . MCV 09/30/2018 100.0  80.0 - 100.0 fL Final  . MCH 09/30/2018 32.2  26.0 - 34.0 pg Final  . MCHC 09/30/2018 32.2  30.0 - 36.0 g/dL Final  . RDW 09/30/2018 14.8  11.5 - 15.5 % Final  . Platelets 09/30/2018 122* 150 - 400 K/uL Final   Comment: PLATELET COUNT CONFIRMED BY SMEAR SPECIMEN CHECKED FOR CLOTS Immature Platelet Fraction may be clinically indicated, consider ordering this additional test [id number]   . nRBC 09/30/2018 0.0  0.0  - 0.2 % Final  . Neutrophils Relative % 09/30/2018 65  % Final  . Neutro Abs 09/30/2018 3.2  1.7 - 7.7 K/uL Final  . Lymphocytes Relative 09/30/2018 22  % Final  .  Lymphs Abs 09/30/2018 1.1  0.7 - 4.0 K/uL Final  . Monocytes Relative 09/30/2018 8  % Final  . Monocytes Absolute 09/30/2018 0.4  0.1 - 1.0 K/uL Final  . Eosinophils Relative 09/30/2018 3  % Final  . Eosinophils Absolute 09/30/2018 0.1  0.0 - 0.5 K/uL Final  . Basophils Relative 09/30/2018 1  % Final  . Basophils Absolute 09/30/2018 0.1  0.0 - 0.1 K/uL Final  . Immature Granulocytes 09/30/2018 1  % Final  . Abs Immature Granulocytes 09/30/2018 0.03  0.00 - 0.07 K/uL Final   Performed at [facility], [address] [place], [place] [address]  . Sodium 09/30/2018 136  135 - 145 mmol/L Final  . Potassium 09/30/2018 4.3  3.5 - 5.1 mmol/L Final  . Chloride 09/30/2018 104  98 - 111 mmol/L Final  . CO2 09/30/2018 21* 22 - 32 mmol/L Final  . Glucose, Bld 09/30/2018 290* 70 - 99 mg/dL Final  . BUN 09/30/2018 35* 8 - 23 mg/dL Final  . Creatinine, Ser 09/30/2018 2.99* 0.61 - 1.24 mg/dL Final  . Calcium 09/30/2018 8.8* 8.9 - 10.3 mg/dL Final  . Total Protein 09/30/2018 7.1  6.5 - 8.1 g/dL Final  . Albumin 09/30/2018 3.9  3.5 - 5.0 g/dL Final  . AST 09/30/2018 23  15 - 41 U/L Final  . ALT 09/30/2018 29  0 - 44 U/L Final  . Alkaline Phosphatase 09/30/2018 117  38 - 126 U/L Final  . Total Bilirubin 09/30/2018 0.6  0.3 - 1.2 mg/dL Final  . GFR calc non Af Amer 09/30/2018 20* >60 mL/min Final  . GFR calc Af Amer 09/30/2018 24* >60 mL/min Final  . Anion gap 09/30/2018 11  5 - 15 Final   Performed at [facility], [address] [place], [place] [address]  . LDH 09/30/2018 123  98 - 192 U/L Final   Performed at [facility], [address], [place], [place] [address]  . Total Protein ELP 09/30/2018 6.4  6.0 - 8.5 g/dL Final  . Albumin ELP 09/30/2018 3.9  2.9 - 4.4 g/dL Final  . Alpha-1-Globulin 09/30/2018 0.2  0.0 - 0.4 g/dL Final  .  Alpha-2-Globulin 09/30/2018 0.8  0.4 - 1.0 g/dL Final  . Beta Globulin 09/30/2018 1.1  0.7 - 1.3 g/dL Final  . Gamma Globulin 09/30/2018 0.5  0.4 - 1.8 g/dL Final  . M-Spike, % 09/30/2018 0.2* Not Observed g/dL Final  . SPE Interp. 09/30/2018 Comment   Final   Comment: (NOTE) Faint band in gamma region suspicious for monoclonal immunoglobulin. Performed At: [facility] [place], [place] [id number] Rush Farmer MD [id number]   . Comment 09/30/2018 Comment   Final   Comment: (NOTE) Protein electrophoresis scan will follow via computer, mail, or courier delivery.   [name] GLOBULIN, TOTAL 09/30/2018 2.5  2.2 - 3.9 g/dL Corrected  . A/G Ratio 09/30/2018 1.6  0.7 - 1.7 Corrected  . IgG (Immunoglobin G), Serum 09/30/2018 622* 700 - 1,600 mg/dL Final  . IgA 09/30/2018 306  61 - 437 mg/dL Final  . IgM (Immunoglobulin M), Srm 09/30/2018 47  20 - 172 mg/dL Final   Comment: (NOTE) Performed At: [facility] [place], [place] [id number] Rush Farmer MD [id number]   . Kappa free light chain 09/30/2018 98.4* 3.3 - 19.4 mg/L Final  . Lamda free light chains 09/30/2018 51.8* 5.7 - 26.3 mg/L Final  . Kappa, lamda light chain ratio 09/30/2018 1.90* 0.26 - 1.65 Final   Comment: (  NOTE) Performed At: [facility] [place], [place] [id number] Rush Farmer MD [id number]      Pathology Orders Placed This Encounter  Procedures  . US Venous Img Lower Unilateral Left    Standing Status:   Future    Number of Occurrences:   1    Standing Expiration Date:   10/07/2019    Order Specific Question:   Reason for Exam (SYMPTOM  OR DIAGNOSIS REQUIRED)    Answer:   left leg swelling    Order Specific Question:   Preferred imaging location?    Answer:   [facility]    Order Specific Question:   Call Results- Best Contact Number?    Answer:   [phone] do not hold  . CBC with Differential/Platelet    Standing Status:    Future    Standing Expiration Date:   10/07/2020  . Comprehensive metabolic panel    Standing Status:   Future    Standing Expiration Date:   10/07/2020  . Lactate dehydrogenase    Standing Status:   Future    Standing Expiration Date:   10/07/2020  . Protein electrophoresis, serum    Standing Status:   Future    Standing Expiration Date:   10/07/2020  . IgG, IgA, IgM    Standing Status:   Future    Standing Expiration Date:   10/07/2020  . Kappa/lambda light chains    Standing Status:   Future    Standing Expiration Date:   10/07/2020       Zoila Shutter MD

## 2018-10-20 ENCOUNTER — Other Ambulatory Visit (HOSPITAL_COMMUNITY): Payer: Self-pay | Admitting: *Deleted

## 2018-10-20 DIAGNOSIS — C9000 Multiple myeloma not having achieved remission: Secondary | ICD-10-CM

## 2018-10-20 MED ORDER — LENALIDOMIDE 2.5 MG PO CAPS: 2.5000 mg | ORAL_CAPSULE | Freq: Every day | ORAL | 0 refills | Status: DC

## 2018-10-20 NOTE — Telephone Encounter (Signed)
Chart reviewed, per Dr. Walden Field last note, patient to continue Lenalidomide at current dose, lenalidomide refilled.

## 2018-10-21 ENCOUNTER — Encounter (HOSPITAL_COMMUNITY): Payer: Self-pay | Admitting: Internal Medicine

## 2018-10-21 ENCOUNTER — Inpatient Hospital Stay (HOSPITAL_COMMUNITY): Payer: Medicare Other

## 2018-10-21 ENCOUNTER — Inpatient Hospital Stay (HOSPITAL_BASED_OUTPATIENT_CLINIC_OR_DEPARTMENT_OTHER): Payer: Medicare Other | Admitting: Internal Medicine

## 2018-10-21 VITALS — BP 149/57 | HR 80 | Temp 98.4°F | Resp 18 | Ht 66.0 in | Wt 225.5 lb

## 2018-10-21 DIAGNOSIS — D696 Thrombocytopenia, unspecified: Secondary | ICD-10-CM

## 2018-10-21 DIAGNOSIS — D649 Anemia, unspecified: Secondary | ICD-10-CM | POA: Diagnosis not present

## 2018-10-21 DIAGNOSIS — Z7901 Long term (current) use of anticoagulants: Secondary | ICD-10-CM | POA: Diagnosis not present

## 2018-10-21 DIAGNOSIS — I82402 Acute embolism and thrombosis of unspecified deep veins of left lower extremity: Secondary | ICD-10-CM | POA: Diagnosis not present

## 2018-10-21 DIAGNOSIS — Z905 Acquired absence of kidney: Secondary | ICD-10-CM | POA: Diagnosis not present

## 2018-10-21 DIAGNOSIS — C9000 Multiple myeloma not having achieved remission: Secondary | ICD-10-CM | POA: Diagnosis not present

## 2018-10-21 DIAGNOSIS — Z85528 Personal history of other malignant neoplasm of kidney: Secondary | ICD-10-CM | POA: Diagnosis not present

## 2018-10-21 DIAGNOSIS — I82492 Acute embolism and thrombosis of other specified deep vein of left lower extremity: Secondary | ICD-10-CM

## 2018-10-21 LAB — COMPREHENSIVE METABOLIC PANEL
ALT: 28 U/L (ref 0–44)
AST: 15 U/L (ref 15–41)
Albumin: 3.9 g/dL (ref 3.5–5.0)
Alkaline Phosphatase: 105 U/L (ref 38–126)
Anion gap: 9 (ref 5–15)
BUN: 38 mg/dL — ABNORMAL HIGH (ref 8–23)
CO2: 24 mmol/L (ref 22–32)
Calcium: 8.8 mg/dL — ABNORMAL LOW (ref 8.9–10.3)
Chloride: 102 mmol/L (ref 98–111)
Creatinine, Ser: 3.15 mg/dL — ABNORMAL HIGH (ref 0.61–1.24)
GFR calc Af Amer: 22 mL/min — ABNORMAL LOW (ref >60)
GFR calc non Af Amer: 19 mL/min — ABNORMAL LOW (ref >60)
GLUCOSE: 268 mg/dL — AB (ref 70–99)
Potassium: 4 mmol/L (ref 3.5–5.1)
SODIUM: 135 mmol/L (ref 135–145)
Total Bilirubin: 0.7 mg/dL (ref 0.3–1.2)
Total Protein: 6.9 g/dL (ref 6.5–8.1)

## 2018-10-21 LAB — CBC WITH DIFFERENTIAL/PLATELET
Abs Immature Granulocytes: 0.03 10*3/uL (ref 0.00–0.07)
BASOS ABS: 0.1 10*3/uL (ref 0.0–0.1)
Basophils Relative: 1 %
Eosinophils Absolute: 0.2 10*3/uL (ref 0.0–0.5)
Eosinophils Relative: 3 %
HCT: 30.3 % — ABNORMAL LOW (ref 39.0–52.0)
Hemoglobin: 9.7 g/dL — ABNORMAL LOW (ref 13.0–17.0)
Immature Granulocytes: 1 %
LYMPHS PCT: 26 %
Lymphs Abs: 1.3 10*3/uL (ref 0.7–4.0)
MCH: 32 pg (ref 26.0–34.0)
MCHC: 32 g/dL (ref 30.0–36.0)
MCV: 100 fL (ref 80.0–100.0)
Monocytes Absolute: 0.5 10*3/uL (ref 0.1–1.0)
Monocytes Relative: 9 %
Neutro Abs: 3 10*3/uL (ref 1.7–7.7)
Neutrophils Relative %: 60 %
Platelets: 129 10*3/uL — ABNORMAL LOW (ref 150–400)
RBC: 3.03 MIL/uL — ABNORMAL LOW (ref 4.22–5.81)
RDW: 14.5 % (ref 11.5–15.5)
WBC: 4.9 10*3/uL (ref 4.0–10.5)
nRBC: 0 % (ref 0.0–0.2)

## 2018-10-21 NOTE — Progress Notes (Signed)
Diagnosis No diagnosis found.  Staging Cancer Staging No matching staging information was found for the patient.  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  He was being treated with Revlimid, Velcade and low-dose Decadron under the direction of Dr. Perlov.  He underwent repeat bone marrow biopsy on Feb 27, 2018 at [facility] that showed no diagnostic or definitive evidence of residual plasma cell neoplasm with < 1% plasma cells.  He has been seen at [facility] 04/24/2018.    Review of records obtained from Dr. Gasparetto at [facility] indicate pt was last treated with Revlimid in May 2019 and Velcade 03/11/2018.  He does not desire transplant at this time.  He is recommended for Revllimid maintenance dose adjusted for renal function at 2.5 mg daily.  He will be seen at [facility] if he decides for transplant.    Labs done 10/21/2018 reviewed and showed WBC 4.9 Hb 9.7 plts 129,000. Chemistries WNL with K+ 4, Cr 3.15 and normal LFTs.  Recent SPEP showed minimal M-spike measuring 0.2 g/dl.  FLC ratio stable at 1.9.  Pt will continue Revlimid at present dose.  I have discussed with him minimal increase in M-spike. He will have repeat labs in 10/2018 and pt will follow-up to go over results.  Previous SPEP has been negative.    2.  Left LE DVT.  Pt had Stat bilateral LE doppler done 10/07/2018  reviewed and showed LLE DVT.  I have discussed with pt options of anticoagulation with lovenox vs coumadin vs Doac therapy.  He was given trial of Eliquis 5 mg po bid for 7 days starter pack sent to pharmacy.  He will then begin Eliquis 2.5 mg po bid.  He will be monitored closely due to Renal insufficiency.  He is advised to notify the office if he has any bleeding issues prior to his next visit.  Hb 9.7.  plts 129,000.   Discussed use of compression socks.    3.  RI.  Cr 3.15.   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  Pt on modified dose of Revlimid and will watch kidney function closely.  Eliquis dosing  modified due to RI.    4.  Anemia.  HB 9.7. Will repeat labs 10/2018.    5.  Thrombocytopenia.  Plt count 129,000.  Will repeat labs 10/2018.    5.  DM.  BS 268.  Follow-up with PCP for management.  Pt no longer on Decadron.  Have requested nutrition evaluation.    6.  Neuropathy.  Pt on Neurontin.  I have discussed with him uncontrolled DM may also affect neuropathy.    25 minutes spent with more than 50% spent in counseling and coordination of care.    Current Status:  Pt is seen today for follow-up.  He feels left leg swelling is improved since starting Eliquis.  He denies any bleeding or bruising.      Multiple myeloma (HCC)   08/29/2017 Initial Diagnosis    Multiple myeloma (HCC)    12/02/2017 -  Chemotherapy    The patient had bortezomib SQ (VELCADE) chemo injection 2.75 mg, 1.3 mg/m2 = 2.75 mg, Subcutaneous,  Once, 5 of 5 cycles Administration: 2.75 mg (12/10/2017), 2.75 mg (12/17/2017), 2.75 mg (12/31/2017), 2.75 mg (01/21/2018), 2.75 mg (02/18/2018), 2.75 mg (03/11/2018)  for chemotherapy treatment.       Problem List Patient Active Problem List   Diagnosis Date Noted  . Internal hemorrhoids [K64.8] 03/18/2018  . Hematochezia [  K92.1] 12/18/2017  . Multiple myeloma (HCC) [C90.00] 08/29/2017  . CKD (chronic kidney disease) [N18.9] 08/29/2017  . Symptomatic anemia [D64.9]   . 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 AKI (acute kidney injury) (HCC) [N17.9] 07/24/2017  . Closed fracture of transverse process of lumbar vertebra, with delayed healing, subsequent encounter [S32.009G] 07/24/2017  . Renal failure [N19] 07/23/2017  . Hypercalcemia [E83.52] 07/23/2017  . Anemia [D64.9] 07/23/2017  . Thrombocytopenia (HCC) [D69.6] 07/23/2017  . Diabetes mellitus type 2 in obese (HCC) [E11.69, E66.9] 07/23/2017    Past Medical History Past Medical History:  Diagnosis Date  . Cancer (HCC)    multiple myeloma  . Cancer of right kidney (HCC)   . Cervical dystonia   . Diabetes  mellitus without complication (HCC)     Past Surgical History Past Surgical History:  Procedure Laterality Date  . BONE MARROW BIOPSY    . CHOLECYSTECTOMY  2007  . COLONOSCOPY WITH PROPOFOL N/A 01/16/2018   Procedure: COLONOSCOPY WITH PROPOFOL;  Surgeon: Rourk, Robert M, MD;  Location: [facility];  Service: Endoscopy;  Laterality: N/A;  1:45pm  . ESOPHAGOGASTRODUODENOSCOPY (EGD) WITH PROPOFOL N/A 01/16/2018   Procedure: ESOPHAGOGASTRODUODENOSCOPY (EGD) WITH PROPOFOL;  Surgeon: Rourk, Robert M, MD;  Location: [facility];  Service: Endoscopy;  Laterality: N/A;  . GIVENS CAPSULE STUDY N/A 05/12/2018   Procedure: GIVENS CAPSULE STUDY;  Surgeon: Rourk, Robert M, MD;  Location: [facility];  Service: Endoscopy;  Laterality: N/A;  7:30am  . NEPHRECTOMY Right 1998   cancer    Family History Family History  Problem Relation Age of Onset  . Heart failure Mother 73  . Dementia Father   . Colon cancer Neg Hx      Social History  reports that he has never smoked. He quit smokeless tobacco use about 16 months ago.  His smokeless tobacco use included chew. He reports that he does not drink alcohol or use drugs.  Medications  Current Outpatient Medications:  .  acyclovir (ZOVIRAX) 400 MG tablet, TAKE 1 TABLET BY MOUTH TWICE DAILY, Disp: 60 tablet, Rfl: 0 .  allopurinol (ZYLOPRIM) 300 MG tablet, Take 300 mg by mouth every morning., Disp: , Rfl:  .  apixaban (ELIQUIS) 2.5 MG TABS tablet, After completion of starter pack on D8 begin 2.5 po  mg bid, Disp: 60 tablet, Rfl: 2 .  B-D UF III MINI PEN NEEDLES 31G X 5 MM MISC, , Disp: , Rfl: 4 .  cyclobenzaprine (FLEXERIL) 10 MG tablet, Take 10 mg by mouth at bedtime., Disp: , Rfl:  .  ELIQUIS STARTER PACK (ELIQUIS STARTER PACK) 5 MG TABS, Take as directed on package: start with two-5mg tablets twice daily for 7 days. On day 8, switch to one-5mg tablet twice daily., Disp: 1 each, Rfl: 0 .  furosemide (LASIX) 40 MG tablet, Take 40 mg by mouth every  Monday, Wednesday, and Friday. , Disp: , Rfl:  .  gabapentin (NEURONTIN) 300 MG capsule, TAKE 1 CAPSULE BY MOUTH AT BEDTIME, Disp: 30 capsule, Rfl: 4 .  glipiZIDE (GLUCOTROL XL) 5 MG 24 hr tablet, Take 10 mg by mouth daily with breakfast. , Disp: , Rfl:  .  lenalidomide (REVLIMID) 2.5 MG capsule, Take 1 capsule (2.5 mg total) by mouth daily., Disp: 30 capsule, Rfl: 0 .  Multiple Vitamins-Minerals (CENTRUM SILVER 50+MEN) TABS, Take 1 tablet by mouth every morning., Disp: , Rfl:  .  pravastatin (PRAVACHOL) 20 MG tablet, Take 20 mg by mouth at bedtime. , Disp: , Rfl:  .    sodium bicarbonate 650 MG tablet, Take 1 tablet (650 mg total) by mouth 2 (two) times daily. (Patient taking differently: Take 650 mg by mouth 3 (three) times daily. ), Disp: 60 tablet, Rfl: 1 No current facility-administered medications for this visit.   Facility-Administered Medications Ordered in Other Visits:  .  0.9 %  sodium chloride infusion, 250 mL, Intravenous, Once, Twana First, MD  Allergies Patient has no known allergies.  Review of Systems Review of Systems - Oncology ROS negative   Physical Exam  Vitals Wt Readings from Last 3 Encounters:  10/21/18 225 lb 8 oz (102.3 kg)  10/07/18 224 lb 9.6 oz (101.9 kg)  08/06/18 228 lb (103.4 kg)   Temp Readings from Last 3 Encounters:  10/21/18 98.4 F (36.9 C) (Oral)  10/07/18 98.4 F (36.9 C) (Oral)  08/06/18 97.7 F (36.5 C) (Oral)   BP Readings from Last 3 Encounters:  10/21/18 (!) 149/57  10/07/18 (!) 142/66  08/06/18 (!) 149/59   Pulse Readings from Last 3 Encounters:  10/21/18 80  10/07/18 79  08/06/18 71   Constitutional: Well-developed, well-nourished, and in no distress.   HENT: Head: Normocephalic and atraumatic.  Mouth/Throat: No oropharyngeal exudate. Mucosa moist. Eyes: Pupils are equal, round, and reactive to light. Conjunctivae are normal. No scleral icterus.  Neck: Normal range of motion. Neck supple. No JVD present.  Cardiovascular:  Normal rate, regular rhythm and normal heart sounds.  Exam reveals no gallop and no friction rub.   No murmur heard. Pulmonary/Chest: Effort normal and breath sounds normal. No respiratory distress. No wheezes.No rales.  Abdominal: Soft. Bowel sounds are normal. No distension. There is no tenderness. There is no guarding.  Musculoskeletal: No edema or tenderness. Swelling in LLE improved.   Lymphadenopathy: No cervical, axillary or supraclavicular adenopathy.  Neurological: Alert and oriented to person, place, and time. No cranial nerve deficit.  Skin: Skin is warm and dry. No rash noted. No erythema. No pallor.  Psychiatric: Affect and judgment normal.   Labs Appointment on 10/21/2018  Component Date Value Ref Range Status  . WBC 10/21/2018 4.9  4.0 - 10.5 K/uL Final  . RBC 10/21/2018 3.03* 4.22 - 5.81 MIL/uL Final  . Hemoglobin 10/21/2018 9.7* 13.0 - 17.0 g/dL Final  . HCT 10/21/2018 30.3* 39.0 - 52.0 % Final  . MCV 10/21/2018 100.0  80.0 - 100.0 fL Final  . MCH 10/21/2018 32.0  26.0 - 34.0 pg Final  . MCHC 10/21/2018 32.0  30.0 - 36.0 g/dL Final  . RDW 10/21/2018 14.5  11.5 - 15.5 % Final  . Platelets 10/21/2018 129* 150 - 400 K/uL Final   Comment: SPECIMEN CHECKED FOR CLOTS Immature Platelet Fraction may be clinically indicated, consider ordering this additional test [id number] CONSISTENT WITH PREVIOUS RESULT   . nRBC 10/21/2018 0.0  0.0 - 0.2 % Final  . Neutrophils Relative % 10/21/2018 60  % Final  . Neutro Abs 10/21/2018 3.0  1.7 - 7.7 K/uL Final  . Lymphocytes Relative 10/21/2018 26  % Final  . Lymphs Abs 10/21/2018 1.3  0.7 - 4.0 K/uL Final  . Monocytes Relative 10/21/2018 9  % Final  . Monocytes Absolute 10/21/2018 0.5  0.1 - 1.0 K/uL Final  . Eosinophils Relative 10/21/2018 3  % Final  . Eosinophils Absolute 10/21/2018 0.2  0.0 - 0.5 K/uL Final  . Basophils Relative 10/21/2018 1  % Final  . Basophils Absolute 10/21/2018 0.1  0.0 - 0.1 K/uL Final  . Immature  Granulocytes 10/21/2018 1  %  Final  . Abs Immature Granulocytes 10/21/2018 0.03  0.00 - 0.07 K/uL Final   Performed at [facility], [address] [place], [place] [address]  . Sodium 10/21/2018 135  135 - 145 mmol/L Final  . Potassium 10/21/2018 4.0  3.5 - 5.1 mmol/L Final  . Chloride 10/21/2018 102  98 - 111 mmol/L Final  . CO2 10/21/2018 24  22 - 32 mmol/L Final  . Glucose, Bld 10/21/2018 268* 70 - 99 mg/dL Final  . BUN 10/21/2018 38* 8 - 23 mg/dL Final  . Creatinine, Ser 10/21/2018 3.15* 0.61 - 1.24 mg/dL Final  . Calcium 10/21/2018 8.8* 8.9 - 10.3 mg/dL Final  . Total Protein 10/21/2018 6.9  6.5 - 8.1 g/dL Final  . Albumin 10/21/2018 3.9  3.5 - 5.0 g/dL Final  . AST 10/21/2018 15  15 - 41 U/L Final  . ALT 10/21/2018 28  0 - 44 U/L Final  . Alkaline Phosphatase 10/21/2018 105  38 - 126 U/L Final  . Total Bilirubin 10/21/2018 0.7  0.3 - 1.2 mg/dL Final  . GFR calc non Af Amer 10/21/2018 19* >60 mL/min Final  . GFR calc Af Amer 10/21/2018 22* >60 mL/min Final  . Anion gap 10/21/2018 9  5 - 15 Final   Performed at [facility], [address], [place], [place] [address]     Pathology No orders of the defined types were placed in this encounter.      Zoila Shutter MD

## 2018-10-23 ENCOUNTER — Encounter: Payer: Self-pay | Admitting: Dietician

## 2018-10-23 ENCOUNTER — Encounter (HOSPITAL_COMMUNITY): Payer: Self-pay | Admitting: Dietician

## 2018-10-23 NOTE — Progress Notes (Signed)
Nutrition Education Note  RD consulted for education regarding glucose management. Pt has history of multiple myeloma. He is currently on maintenance Revlimid. Currently not on steroids. He had been on decadron in past, though this was stopped many months ago.  Consult specifically made d/t patient routinely presenting to [facility] appts with excessively high BG; since May, all of routine labs done for [facility] appts have returned with BG >250 w/ several being >300 and one >400.    Consult done remotely. Pt reports he was only just recently begin on Insulin by PCP (toujeo). He was told to titrate it up based on his morning bg checks. He says recently BG has been running 142-150    24 hr diet recall.  Breakfast: Raisin bran vs. Danish/Roll vs Ham biscuit. Drinks "small glass" of juice. Prefers "something light" Lunch: Often eats out. He goes to a [employer]" style place. Says he likes to get salmon patties, with cooked greens and sweet corn. He sometimes eats hamburgers or hotdogs Dinner: Had leftover [place] w/ cornbread dressing, green beans and mashed potatoes.  Beverages: Other than 1 glass of juice, only drinks water and diet soda.  Behaviors: Eats out fairly regularly. Does not read labels. Does NOT skip meals.   Pt's largest issue seems to be with breakfast. In addition to his reportedly consumed items being the least appropriate at breakfast, his pre-appt labwork, which shows extreme hyperglycemia, is nearly always done in the late mornings, which is when his breakfast food choices would manifest.   RD explained to patient that his danishes, rolls, juice and biscuits only contain carbohydrate. His breakfast sounds to be nearly absent of protein or fiber. RD explained the importance of both protein and fiber in glycemic control. RD reviewed breakfast items that would provide protein or fiber such as eggs, PB on [employer] toast, protein bars, oatmeal or sausage/bacon. Explained that raisin bran does have  some fiber, but it is extremely high in sugar d/t sugar-coated raisins. We brainstormed appropriate meals  for him to try.   More broadly speaking, RD reviewed the plate method. He should try to structure his meals so that his plate is 16% protein, 25% starch/grain/carb, 25% fruit and 25% vegetables. From his diet recall, his meals sound to be closer to half grains/starches. RD reviewed the starchy vegetables and how these are classified. (he mentions eating corn several times). Reviewed examples of non starchy vegetables. He says he already chooses whole grains.  Fortunately, pt is already in habit of eating 3x a day and not skipping meals. RD reviewed why it is important that he continue this.   RD noted why Eating out can be dangerous-ie excessive portion sizes, hidden sugar/salt/fat and typical lack of nutritional information. RD recommended he try his hardest to make meals at home. If he does eat out, he should again try to follow the plate method and choose lean proteins/vegetables.   Beverage-wise, pt only drinks water, diet soda, and 1 glass of juice in mornings. RD recommended he keep his juice serving to 4 oz.   Pt says he has a diabetic for >20 years, but does not believe he has ever received formal diet education. RD asked if he would be interested in other OP resources. Unfortunately, he has scheduling conflicts with the 3AG/5XM Monday of each month when the free DM classes are held. RD asked about a formal OP appt at the [facility]. While not committing himself, he agreed to hear more about what this entails. Will  forward pt to RD there.   Summary of Recommendations:  1. Include a high protein/fiber source every day for breakfast: oatmeal, eggs, PB, Protein bar, sausage/bacon (ocassionaly)  2. Eat at least 3x a day. Never skip meals 3. Drink Non-sweet beverages. Be careful of juice quantity.  4. When eating out, be wary of breads, pastas, and other grains. Choose lean proteins and nonstarchy  vegetables 5. Choose whole grains 6. Read labels. prioritize items high in protein/fiber and low in total carb/added sugar.  Pt has not had an A1c done since last February. New A1C would help with pinpointing if patient BG is disproportionately high postprandial after breakfast, or if is this high throughout the day.   He was agreeable to RD sending him resources in mail. RD mailed handouts titled: "Type 2 diabetes Nutrition Therapy", "Diabetes Label Reading Tips" and "Glucose Management". Handwrote detailed instructions and highlighted most pertinent points. On label specifically, wrote how to calculate total carb intake based on serving size and label information. Asked him to prioritize items high in protein/fiber and low in total carb/added sugar. Gave him goal of 75g carb/meals  Patient appreciative for call.  Burtis Junes RD, LDN, CNSC Clinical Nutrition Available Tues-Sat via Pager: [phone] 10/23/2018 3:50 PM

## 2018-10-27 ENCOUNTER — Telehealth: Payer: Self-pay | Admitting: Nutrition

## 2018-10-27 NOTE — Telephone Encounter (Signed)
VM left to call to discuss how to set up an appt.

## 2018-11-11 ENCOUNTER — Other Ambulatory Visit (HOSPITAL_COMMUNITY): Payer: Self-pay | Admitting: Internal Medicine

## 2018-11-11 DIAGNOSIS — C9000 Multiple myeloma not having achieved remission: Secondary | ICD-10-CM

## 2018-11-12 ENCOUNTER — Other Ambulatory Visit (HOSPITAL_COMMUNITY): Payer: Self-pay | Admitting: Internal Medicine

## 2018-11-12 MED ORDER — ACYCLOVIR 200 MG PO CAPS: 200.0000 mg | ORAL_CAPSULE | Freq: Two times a day (BID) | ORAL | 2 refills | Status: DC

## 2018-11-18 ENCOUNTER — Inpatient Hospital Stay (HOSPITAL_COMMUNITY): Payer: Medicare Other | Attending: Hematology

## 2018-11-18 DIAGNOSIS — Z7901 Long term (current) use of anticoagulants: Secondary | ICD-10-CM | POA: Diagnosis not present

## 2018-11-18 DIAGNOSIS — Z87891 Personal history of nicotine dependence: Secondary | ICD-10-CM | POA: Diagnosis not present

## 2018-11-18 DIAGNOSIS — C9000 Multiple myeloma not having achieved remission: Secondary | ICD-10-CM | POA: Insufficient documentation

## 2018-11-18 DIAGNOSIS — D696 Thrombocytopenia, unspecified: Secondary | ICD-10-CM | POA: Insufficient documentation

## 2018-11-18 DIAGNOSIS — E119 Type 2 diabetes mellitus without complications: Secondary | ICD-10-CM | POA: Insufficient documentation

## 2018-11-18 DIAGNOSIS — Z9221 Personal history of antineoplastic chemotherapy: Secondary | ICD-10-CM | POA: Insufficient documentation

## 2018-11-18 DIAGNOSIS — Z794 Long term (current) use of insulin: Secondary | ICD-10-CM | POA: Insufficient documentation

## 2018-11-18 DIAGNOSIS — I824Z2 Acute embolism and thrombosis of unspecified deep veins of left distal lower extremity: Secondary | ICD-10-CM | POA: Insufficient documentation

## 2018-11-18 DIAGNOSIS — M7989 Other specified soft tissue disorders: Secondary | ICD-10-CM

## 2018-11-18 DIAGNOSIS — Z79899 Other long term (current) drug therapy: Secondary | ICD-10-CM | POA: Diagnosis not present

## 2018-11-18 DIAGNOSIS — D649 Anemia, unspecified: Secondary | ICD-10-CM | POA: Diagnosis not present

## 2018-11-18 DIAGNOSIS — Z85528 Personal history of other malignant neoplasm of kidney: Secondary | ICD-10-CM | POA: Diagnosis not present

## 2018-11-18 DIAGNOSIS — Z905 Acquired absence of kidney: Secondary | ICD-10-CM | POA: Diagnosis not present

## 2018-11-18 LAB — CBC WITH DIFFERENTIAL/PLATELET
Abs Immature Granulocytes: 0.02 10*3/uL (ref 0.00–0.07)
Basophils Absolute: 0.1 10*3/uL (ref 0.0–0.1)
Basophils Relative: 1 %
Eosinophils Absolute: 0.2 10*3/uL (ref 0.0–0.5)
Eosinophils Relative: 4 %
HCT: 30.1 % — ABNORMAL LOW (ref 39.0–52.0)
Hemoglobin: 9.5 g/dL — ABNORMAL LOW (ref 13.0–17.0)
Immature Granulocytes: 0 %
LYMPHS ABS: 1.2 10*3/uL (ref 0.7–4.0)
Lymphocytes Relative: 23 %
MCH: 32 pg (ref 26.0–34.0)
MCHC: 31.6 g/dL (ref 30.0–36.0)
MCV: 101.3 fL — ABNORMAL HIGH (ref 80.0–100.0)
Monocytes Absolute: 0.5 10*3/uL (ref 0.1–1.0)
Monocytes Relative: 9 %
Neutro Abs: 3.4 10*3/uL (ref 1.7–7.7)
Neutrophils Relative %: 63 %
Platelets: 122 10*3/uL — ABNORMAL LOW (ref 150–400)
RBC: 2.97 MIL/uL — ABNORMAL LOW (ref 4.22–5.81)
RDW: 14.7 % (ref 11.5–15.5)
WBC: 5.4 10*3/uL (ref 4.0–10.5)
nRBC: 0 % (ref 0.0–0.2)

## 2018-11-18 LAB — COMPREHENSIVE METABOLIC PANEL
ALT: 28 U/L (ref 0–44)
AST: 17 U/L (ref 15–41)
Albumin: 3.9 g/dL (ref 3.5–5.0)
Alkaline Phosphatase: 90 U/L (ref 38–126)
Anion gap: 11 (ref 5–15)
BUN: 34 mg/dL — ABNORMAL HIGH (ref 8–23)
CO2: 23 mmol/L (ref 22–32)
Calcium: 9 mg/dL (ref 8.9–10.3)
Chloride: 104 mmol/L (ref 98–111)
Creatinine, Ser: 2.94 mg/dL — ABNORMAL HIGH (ref 0.61–1.24)
GFR calc Af Amer: 24 mL/min — ABNORMAL LOW (ref >60)
GFR calc non Af Amer: 21 mL/min — ABNORMAL LOW (ref >60)
Glucose, Bld: 127 mg/dL — ABNORMAL HIGH (ref 70–99)
Potassium: 4.2 mmol/L (ref 3.5–5.1)
Sodium: 138 mmol/L (ref 135–145)
Total Bilirubin: 0.6 mg/dL (ref 0.3–1.2)
Total Protein: 6.9 g/dL (ref 6.5–8.1)

## 2018-11-18 LAB — LACTATE DEHYDROGENASE: LDH: 95 U/L — AB (ref 98–192)

## 2018-11-19 LAB — IGG, IGA, IGM
IgA: 345 mg/dL (ref 61–437)
IgG (Immunoglobin G), Serum: 729 mg/dL (ref 700–1600)
IgM (Immunoglobulin M), Srm: 41 mg/dL (ref 20–172)

## 2018-11-19 LAB — KAPPA/LAMBDA LIGHT CHAINS
KAPPA, LAMDA LIGHT CHAIN RATIO: 1.75 — AB (ref 0.26–1.65)
Kappa free light chain: 100.2 mg/L — ABNORMAL HIGH (ref 3.3–19.4)
Lambda free light chains: 57.1 mg/L — ABNORMAL HIGH (ref 5.7–26.3)

## 2018-11-19 LAB — PROTEIN ELECTROPHORESIS, SERUM
A/G Ratio: 1.3 (ref 0.7–1.7)
Albumin ELP: 3.6 g/dL (ref 2.9–4.4)
Alpha-1-Globulin: 0.2 g/dL (ref 0.0–0.4)
Alpha-2-Globulin: 0.8 g/dL (ref 0.4–1.0)
Beta Globulin: 1 g/dL (ref 0.7–1.3)
Gamma Globulin: 0.7 g/dL (ref 0.4–1.8)
Globulin, Total: 2.7 g/dL (ref 2.2–3.9)
M-Spike, %: 0.1 g/dL — ABNORMAL HIGH
Total Protein ELP: 6.3 g/dL (ref 6.0–8.5)

## 2018-11-24 ENCOUNTER — Other Ambulatory Visit (HOSPITAL_COMMUNITY): Payer: Self-pay | Admitting: *Deleted

## 2018-11-25 ENCOUNTER — Encounter (HOSPITAL_COMMUNITY): Payer: Self-pay | Admitting: Internal Medicine

## 2018-11-25 ENCOUNTER — Other Ambulatory Visit: Payer: Self-pay

## 2018-11-25 ENCOUNTER — Inpatient Hospital Stay (HOSPITAL_BASED_OUTPATIENT_CLINIC_OR_DEPARTMENT_OTHER): Payer: Medicare Other | Admitting: Internal Medicine

## 2018-11-25 VITALS — BP 147/59 | HR 82 | Temp 97.8°F | Resp 16 | Wt 217.5 lb

## 2018-11-25 DIAGNOSIS — Z79899 Other long term (current) drug therapy: Secondary | ICD-10-CM

## 2018-11-25 DIAGNOSIS — Z85528 Personal history of other malignant neoplasm of kidney: Secondary | ICD-10-CM

## 2018-11-25 DIAGNOSIS — D696 Thrombocytopenia, unspecified: Secondary | ICD-10-CM

## 2018-11-25 DIAGNOSIS — I824Z2 Acute embolism and thrombosis of unspecified deep veins of left distal lower extremity: Secondary | ICD-10-CM

## 2018-11-25 DIAGNOSIS — Z905 Acquired absence of kidney: Secondary | ICD-10-CM

## 2018-11-25 DIAGNOSIS — C9000 Multiple myeloma not having achieved remission: Secondary | ICD-10-CM | POA: Diagnosis not present

## 2018-11-25 DIAGNOSIS — D649 Anemia, unspecified: Secondary | ICD-10-CM | POA: Diagnosis not present

## 2018-11-25 DIAGNOSIS — Z9221 Personal history of antineoplastic chemotherapy: Secondary | ICD-10-CM

## 2018-11-25 DIAGNOSIS — E119 Type 2 diabetes mellitus without complications: Secondary | ICD-10-CM

## 2018-11-25 DIAGNOSIS — Z7901 Long term (current) use of anticoagulants: Secondary | ICD-10-CM | POA: Diagnosis not present

## 2018-11-25 DIAGNOSIS — Z87891 Personal history of nicotine dependence: Secondary | ICD-10-CM | POA: Diagnosis not present

## 2018-11-25 DIAGNOSIS — Z794 Long term (current) use of insulin: Secondary | ICD-10-CM | POA: Diagnosis not present

## 2018-11-25 DIAGNOSIS — D7589 Other specified diseases of blood and blood-forming organs: Secondary | ICD-10-CM

## 2018-11-25 MED ORDER — LENALIDOMIDE 2.5 MG PO CAPS: 2.5000 mg | ORAL_CAPSULE | Freq: Every day | ORAL | 0 refills | Status: DC

## 2018-11-25 MED ORDER — APIXABAN 2.5 MG PO TABS: ORAL_TABLET | ORAL | 2 refills | Status: DC

## 2018-11-25 NOTE — Progress Notes (Signed)
Diagnosis Multiple myeloma not having achieved remission ([employer]) - Plan: CBC with Differential/Platelet, Comprehensive metabolic panel, Lactate dehydrogenase, Protein electrophoresis, serum, IgG, IgA, IgM, Kappa/lambda light chains, Immunofixation electrophoresis, Vitamin B12, Folate, Methylmalonic acid, serum, Ferritin, lenalidomide (REVLIMID) 2.5 MG capsule  Macrocytosis - Plan: CBC with Differential/Platelet, Comprehensive metabolic panel, Lactate dehydrogenase, Protein electrophoresis, serum, IgG, IgA, IgM, Kappa/lambda light chains, Immunofixation electrophoresis, Vitamin B12, Folate, Methylmalonic acid, serum, Ferritin  Staging Cancer Staging No matching staging information was found for the patient.  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  He was being treated with Revlimid, Velcade and low-dose Decadron under the direction of Dr. Lebron Conners.  He underwent repeat bone marrow biopsy on Feb 27, 2018 at [facility] that showed no diagnostic or definitive evidence of residual plasma cell neoplasm with < 1% plasma cells.  He has been seen at [facility] 04/24/2018.    Review of records obtained from Dr. Alvie Heidelberg at [facility] indicate pt was last treated with Revlimid in May 2019 and Velcade 03/11/2018.  He does not desire transplant at this time.  He is recommended for Revllimid maintenance dose adjusted for renal function at 2.5 mg daily.  He will be seen at [facility] if he decides for transplant.    Labs done 11/18/2018 reviewed and showed WBC 5.4 HB 9.5 plts 122,000.  Chemistries WNl with K+ 4.2 and normal LFTs.  SPEP showed minimal M spike measuring 0.1 g/dl.  FLC ratio 1.75, normal quant IG.    Pt will continue Revlimid at present dose.  I have discussed with him he has minimal M-spike. He will have repeat labs with IFIX  in 12/2018.  RX for Revlimid sent for processing.  Previously SPEP has been negative.    2.  Left LE DVT.  Pt had Stat bilateral LE doppler done 10/07/2018  reviewed and showed LLE DVT.  I have  discussed with pt options of anticoagulation with lovenox vs coumadin vs Doac therapy.  He was given trial of Eliquis 5 mg po bid for 7 days starter pack sent to pharmacy.  He should continue Eliquis 2.5 mg po bid.  RX sent to pharmacy on record.  He will be monitored closely due to Renal insufficiency.  He is advised to notify the office if he has any bleeding issues prior to his next visit.   Labs showed HB 9.5 plts 122,000.  Pt denies bleeding or bruising.   3.  RI.  Cr 2.94.  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  Pt on modified dose of Revlimid and will watch kidney function closely.  Eliquis dosing modified due to RI.    4.  Anemia.  HB 9.5  Will repeat labs in 12/2018.     5.  Thrombocytopenia.  Plt count 122,000.  Will repeat labs 12/2018.    6.  DM.  BS 127.  Follow-up with PCP for management.  Pt no longer on Decadron.  He reports he is on Insulin.  Follow-up with PCP for monitoring.    7  Neuropathy.  Pt on Neurontin.  I have discussed with him uncontrolled DM may also affect neuropathy.    25 minutes spent with more than 50% spent in counseling and coordination of care.    Current Status:  Pt is seen today for follow-up.  He feels left leg swelling is improved since starting Eliquis.  He denies any bleeding or bruising.  He reports he is out of Revlimid and Eliquis.  Multiple myeloma ([place])   08/29/2017 Initial Diagnosis    Multiple myeloma ([place])    12/02/2017 -  Chemotherapy    The patient had bortezomib SQ (VELCADE) chemo injection 2.75 mg, 1.3 mg/m2 = 2.75 mg, Subcutaneous,  Once, 5 of 5 cycles Administration: 2.75 mg (12/10/2017), 2.75 mg (12/17/2017), 2.75 mg (12/31/2017), 2.75 mg (01/21/2018), 2.75 mg (02/18/2018), 2.75 mg (03/11/2018)  for chemotherapy treatment.       Problem List Patient Active Problem List   Diagnosis Date Noted  . Internal hemorrhoids [K64.8] 03/18/2018  . Hematochezia [K92.1] 12/18/2017  . Multiple myeloma ([place]) [C90.00]  08/29/2017  . CKD (chronic kidney disease) [N18.9] 08/29/2017  . Symptomatic anemia [D64.9]   . 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 AKI (acute kidney injury) ([place]) [N17.9] 07/24/2017  . Closed fracture of transverse process of lumbar vertebra, with delayed healing, subsequent encounter [S32.009G] 07/24/2017  . Renal failure [N19] 07/23/2017  . Hypercalcemia [E83.52] 07/23/2017  . Anemia [D64.9] 07/23/2017  . Thrombocytopenia ([place]) [D69.6] 07/23/2017  . Diabetes mellitus type 2 in obese ([place]) [E11.69, E66.9] 07/23/2017    Past Medical History Past Medical History:  Diagnosis Date  . Cancer ([place])    multiple myeloma  . Cancer of right kidney ([place])   . Cervical dystonia   . Diabetes mellitus without complication [facility])     Past Surgical History Past Surgical History:  Procedure Laterality Date  . BONE MARROW BIOPSY    . CHOLECYSTECTOMY  2007  . COLONOSCOPY WITH PROPOFOL N/A 01/16/2018   Procedure: COLONOSCOPY WITH PROPOFOL;  Surgeon: Daneil Dolin, MD;  [facility];  Service: Endoscopy;  Laterality: N/A;  1:45pm  . ESOPHAGOGASTRODUODENOSCOPY (EGD) WITH PROPOFOL N/A 01/16/2018   Procedure: ESOPHAGOGASTRODUODENOSCOPY (EGD) WITH PROPOFOL;  Surgeon: Daneil Dolin, MD;  [facility];  Service: Endoscopy;  Laterality: N/A;  . GIVENS CAPSULE STUDY N/A 05/12/2018   Procedure: GIVENS CAPSULE STUDY;  Surgeon: Daneil Dolin, MD;  [facility];  Service: Endoscopy;  Laterality: N/A;  7:30am  . NEPHRECTOMY Right 1998   cancer    Family History Family History  Problem Relation Age of Onset  . Heart failure Mother 10  . Dementia Father   . Colon cancer Neg Hx      Social History  reports that he has never smoked. He quit smokeless tobacco use about 17 months ago.  His smokeless tobacco use included chew. He reports that he does not drink alcohol or use drugs.  Medications  Current Outpatient Medications:  .  acyclovir (ZOVIRAX) 200 MG  capsule, Take 1 capsule (200 mg total) by mouth 2 (two) times daily., Disp: 60 capsule, Rfl: 2 .  allopurinol (ZYLOPRIM) 300 MG tablet, Take 300 mg by mouth every morning., Disp: , Rfl:  .  apixaban (ELIQUIS) 2.5 MG TABS tablet, After completion of starter pack on D8 begin 2.5 po  mg bid, Disp: 60 tablet, Rfl: 2 .  B-D UF III MINI PEN NEEDLES 31G X 5 MM MISC, , Disp: , Rfl: 4 .  cyclobenzaprine (FLEXERIL) 10 MG tablet, Take 10 mg by mouth at bedtime., Disp: , Rfl:  .  furosemide (LASIX) 40 MG tablet, Take 40 mg by mouth every Monday, Wednesday, and Friday. , Disp: , Rfl:  .  gabapentin (NEURONTIN) 300 MG capsule, TAKE 1 CAPSULE BY MOUTH AT BEDTIME, Disp: 30 capsule, Rfl: 4 .  glipiZIDE (GLUCOTROL XL) 5 MG 24 hr tablet, Take 10 mg by mouth daily with breakfast. , Disp: ,  Rfl:  .  lenalidomide (REVLIMID) 2.5 MG capsule, Take 1 capsule (2.5 mg total) by mouth daily., Disp: 30 capsule, Rfl: 0 .  Multiple Vitamins-Minerals (CENTRUM SILVER 50+MEN) TABS, Take 1 tablet by mouth every morning., Disp: , Rfl:  .  pravastatin (PRAVACHOL) 20 MG tablet, Take 20 mg by mouth at bedtime. , Disp: , Rfl:  .  sodium bicarbonate 650 MG tablet, Take 1 tablet (650 mg total) by mouth 2 (two) times daily. (Patient taking differently: Take 650 mg by mouth 3 (three) times daily. ), Disp: 60 tablet, Rfl: 1 .  TRESIBA FLEXTOUCH 200 UNIT/ML SOPN, , Disp: , Rfl:  No current facility-administered medications for this visit.   Facility-Administered Medications Ordered in Other Visits:  .  0.9 %  sodium chloride infusion, 250 mL, Intravenous, Once, Twana First, MD  Allergies Patient has no known allergies.  Review of Systems Review of Systems - Oncology ROS negative   Physical Exam  Vitals Wt Readings from Last 3 Encounters:  11/25/18 217 lb 8 oz (98.7 kg)  10/21/18 225 lb 8 oz (102.3 kg)  10/07/18 224 lb 9.6 oz (101.9 kg)   Temp Readings from Last 3 Encounters:  11/25/18 97.8 F (36.6 C) (Oral)  10/21/18 98.4  F (36.9 C) (Oral)  10/07/18 98.4 F (36.9 C) (Oral)   BP Readings from Last 3 Encounters:  11/25/18 (!) 147/59  10/21/18 (!) 149/57  10/07/18 (!) 142/66   Pulse Readings from Last 3 Encounters:  11/25/18 82  10/21/18 80  10/07/18 79   Constitutional: Well-developed, well-nourished, and in no distress.   HENT: Head: Normocephalic and atraumatic.  Mouth/Throat: No oropharyngeal exudate. Mucosa moist. Eyes: Pupils are equal, round, and reactive to light. Conjunctivae are normal. No scleral icterus.  Neck: Normal range of motion. Neck supple. No JVD present.  Cardiovascular: Normal rate, regular rhythm and normal heart sounds.  Exam reveals no gallop and no friction rub.   No murmur heard. Pulmonary/Chest: Effort normal and breath sounds normal. No respiratory distress. No wheezes.No rales.  Abdominal: Soft. Bowel sounds are normal. No distension. There is no tenderness. There is no guarding.  Musculoskeletal: No edema or tenderness. Pt has compression socks on.   Lymphadenopathy: No cervical, axillary or supraclavicular adenopathy.  Neurological: Alert and oriented to person, place, and time. No cranial nerve deficit.  Skin: Skin is warm and dry. No rash noted. No erythema. No pallor.  Psychiatric: Affect and judgment normal.   Labs No visits with results within 3 Day(s) from this visit.  Latest known visit with results is:  Appointment on 11/18/2018  Component Date Value Ref Range Status  . WBC 11/18/2018 5.4  4.0 - 10.5 K/uL Final  . RBC 11/18/2018 2.97* 4.22 - 5.81 MIL/uL Final  . Hemoglobin 11/18/2018 9.5* 13.0 - 17.0 g/dL Final  . HCT 11/18/2018 30.1* 39.0 - 52.0 % Final  . MCV 11/18/2018 101.3* 80.0 - 100.0 fL Final  . MCH 11/18/2018 32.0  26.0 - 34.0 pg Final  . MCHC 11/18/2018 31.6  30.0 - 36.0 g/dL Final  . RDW 11/18/2018 14.7  11.5 - 15.5 % Final  . Platelets 11/18/2018 122* 150 - 400 K/uL Final  . nRBC 11/18/2018 0.0  0.0 - 0.2 % Final  . Neutrophils Relative %  11/18/2018 63  % Final  . Neutro Abs 11/18/2018 3.4  1.7 - 7.7 K/uL Final  . Lymphocytes Relative 11/18/2018 23  % Final  . Lymphs Abs 11/18/2018 1.2  0.7 - 4.0 K/uL Final  .  Monocytes Relative 11/18/2018 9  % Final  . Monocytes Absolute 11/18/2018 0.5  0.1 - 1.0 K/uL Final  . Eosinophils Relative 11/18/2018 4  % Final  . Eosinophils Absolute 11/18/2018 0.2  0.0 - 0.5 K/uL Final  . Basophils Relative 11/18/2018 1  % Final  . Basophils Absolute 11/18/2018 0.1  0.0 - 0.1 K/uL Final  . Immature Granulocytes 11/18/2018 0  % Final  . Abs Immature Granulocytes 11/18/2018 0.02  0.00 - 0.07 K/uL Final   Performed at [facility], [address], [place], [place] [address]  . Sodium 11/18/2018 138  135 - 145 mmol/L Final  . Potassium 11/18/2018 4.2  3.5 - 5.1 mmol/L Final  . Chloride 11/18/2018 104  98 - 111 mmol/L Final  . CO2 11/18/2018 23  22 - 32 mmol/L Final  . Glucose, Bld 11/18/2018 127* 70 - 99 mg/dL Final  . BUN 11/18/2018 34* 8 - 23 mg/dL Final  . Creatinine, Ser 11/18/2018 2.94* 0.61 - 1.24 mg/dL Final  . Calcium 11/18/2018 9.0  8.9 - 10.3 mg/dL Final  . Total Protein 11/18/2018 6.9  6.5 - 8.1 g/dL Final  . Albumin 11/18/2018 3.9  3.5 - 5.0 g/dL Final  . AST 11/18/2018 17  15 - 41 U/L Final  . ALT 11/18/2018 28  0 - 44 U/L Final  . Alkaline Phosphatase 11/18/2018 90  38 - 126 U/L Final  . Total Bilirubin 11/18/2018 0.6  0.3 - 1.2 mg/dL Final  . GFR calc non Af Amer 11/18/2018 21* >60 mL/min Final  . GFR calc Af Amer 11/18/2018 24* >60 mL/min Final  . Anion gap 11/18/2018 11  5 - 15 Final   Performed at [facility], [address], [place], [place] [address]  . LDH 11/18/2018 95* 98 - 192 U/L Final   Performed at [facility], [address], [place],  [address]  . Total Protein ELP 11/18/2018 6.3  6.0 - 8.5 g/dL Final  . Albumin ELP 11/18/2018 3.6  2.9 - 4.4 g/dL Final  . Alpha-1-Globulin 11/18/2018 0.2  0.0 - 0.4 g/dL Final  . Alpha-2-Globulin 11/18/2018 0.8  0.4 - 1.0  g/dL Final  . Beta Globulin 11/18/2018 1.0  0.7 - 1.3 g/dL Final  . Gamma Globulin 11/18/2018 0.7  0.4 - 1.8 g/dL Final  . M-Spike, % 11/18/2018 0.1* Not Observed g/dL Final  . SPE Interp. 11/18/2018 Comment   Final   Comment: (NOTE) Faint band in gamma region suspicious for monoclonal immunoglobulin. Performed At: [facility] [place], [place] [id number] Rush Farmer MD [id number]   . Comment 11/18/2018 Comment   Final   Comment: (NOTE) Protein electrophoresis scan will follow via computer, mail, or courier delivery.   [name] GLOBULIN, TOTAL 11/18/2018 2.7  2.2 - 3.9 g/dL Corrected  . A/G Ratio 11/18/2018 1.3  0.7 - 1.7 Corrected  . IgG (Immunoglobin G), Serum 11/18/2018 729  700 - 1,600 mg/dL Final  . IgA 11/18/2018 345  61 - 437 mg/dL Final  . IgM (Immunoglobulin M), Srm 11/18/2018 41  20 - 172 mg/dL Final   Comment: (NOTE) Performed At: [facility] [place], [place] [id number] Rush Farmer MD [id number]   . Kappa free light chain 11/18/2018 100.2* 3.3 - 19.4 mg/L Final  . Lamda free light chains 11/18/2018 57.1* 5.7 - 26.3 mg/L Final  . Kappa, lamda light chain ratio 11/18/2018 1.75* 0.26 - 1.65 Final   Comment: (NOTE) Performed At: [facility] [place],  [place] [id number] Rush Farmer MD [id number]      Pathology Orders Placed This Encounter  Procedures  . CBC with Differential/Platelet    Standing Status:   Future    Standing Expiration Date:   11/26/2019  . Comprehensive metabolic panel    Standing Status:   Future    Standing Expiration Date:   11/26/2019  . Lactate dehydrogenase    Standing Status:   Future    Standing Expiration Date:   11/26/2019  . Protein electrophoresis, serum    Standing Status:   Future    Standing Expiration Date:   11/26/2019  . IgG, IgA, IgM    Standing Status:   Future    Standing Expiration Date:   11/26/2019  . Kappa/lambda light chains    Standing  Status:   Future    Standing Expiration Date:   11/26/2019  . Immunofixation electrophoresis    Standing Status:   Future    Standing Expiration Date:   11/26/2019  . Vitamin B12    Standing Status:   Future    Standing Expiration Date:   11/26/2019  . Folate    Standing Status:   Future    Standing Expiration Date:   11/26/2019  . Methylmalonic acid, serum    Standing Status:   Future    Standing Expiration Date:   11/26/2019  . Ferritin    Standing Status:   Future    Standing Expiration Date:   11/26/2019       Zoila Shutter MD

## 2018-11-26 DIAGNOSIS — E1129 Type 2 diabetes mellitus with other diabetic kidney complication: Secondary | ICD-10-CM | POA: Diagnosis not present

## 2018-11-26 DIAGNOSIS — N183 Chronic kidney disease, stage 3 (moderate): Secondary | ICD-10-CM | POA: Diagnosis not present

## 2018-11-26 DIAGNOSIS — D509 Iron deficiency anemia, unspecified: Secondary | ICD-10-CM | POA: Diagnosis not present

## 2018-11-26 DIAGNOSIS — Z1159 Encounter for screening for other viral diseases: Secondary | ICD-10-CM | POA: Diagnosis not present

## 2018-11-26 DIAGNOSIS — E559 Vitamin D deficiency, unspecified: Secondary | ICD-10-CM | POA: Diagnosis not present

## 2018-11-26 DIAGNOSIS — I10 Essential (primary) hypertension: Secondary | ICD-10-CM | POA: Diagnosis not present

## 2018-11-26 DIAGNOSIS — Z79899 Other long term (current) drug therapy: Secondary | ICD-10-CM | POA: Diagnosis not present

## 2018-12-03 DIAGNOSIS — I82402 Acute embolism and thrombosis of unspecified deep veins of left lower extremity: Secondary | ICD-10-CM | POA: Diagnosis not present

## 2018-12-03 DIAGNOSIS — E1122 Type 2 diabetes mellitus with diabetic chronic kidney disease: Secondary | ICD-10-CM | POA: Diagnosis not present

## 2018-12-03 DIAGNOSIS — C9000 Multiple myeloma not having achieved remission: Secondary | ICD-10-CM | POA: Diagnosis not present

## 2018-12-18 ENCOUNTER — Other Ambulatory Visit (HOSPITAL_COMMUNITY): Payer: Self-pay | Admitting: *Deleted

## 2018-12-18 DIAGNOSIS — C9000 Multiple myeloma not having achieved remission: Secondary | ICD-10-CM

## 2018-12-18 MED ORDER — LENALIDOMIDE 2.5 MG PO CAPS: 2.5000 mg | ORAL_CAPSULE | Freq: Every day | ORAL | 0 refills | Status: DC

## 2018-12-18 NOTE — Telephone Encounter (Signed)
Chart reviewed, per Dr. Walden Field last office note, okay to refill revlimid.

## 2018-12-23 ENCOUNTER — Ambulatory Visit: Payer: Medicare Other | Admitting: Nurse Practitioner

## 2018-12-23 ENCOUNTER — Telehealth: Payer: Self-pay | Admitting: Nurse Practitioner

## 2018-12-23 ENCOUNTER — Encounter: Payer: Self-pay | Admitting: Internal Medicine

## 2018-12-23 NOTE — Telephone Encounter (Signed)
Patient was a no show and letter sent  °

## 2018-12-23 NOTE — Progress Notes (Deleted)
Referring Provider: Asencion Noble, MD Primary Care Physician:  Asencion Noble, MD Primary GI:  Dr. Gala Romney  No chief complaint on file.   HPI:   Tom Marshall is a 68 y.o. male who presents for follow-up on hemorrhoids and anemia.  The patient was last seen in our office 06/18/2018 for internal hemorrhoids, hematochezia, anemia.  History of hemoglobin as low as 5.5.  Colonoscopy up-to-date 2019 which found nonbleeding internal hemorrhoids and essentially normal.  EGD the same day also essentially normal.  There is a recommendation for small bowel capsule endoscopy.  Capsule endoscopy completed 05/12/2018 with poor prep with debris.  Any small tumor, lesion, bleeding would be difficult to see.  Overall findings consistent with multifactorial anemia and likely low-volume hematochezia due to known hemorrhoids rather than significant GI bleed.  Recommended discontinuing 81 mg aspirin if no cardiac history and repeat capsule endoscopy if any further transfusion dependent anemia occurs, but give with a modified prep included extended clear liquids and low-dose Reglan 30 to 45 minutes prior to capsule ingestion.  History of multiple myeloma being followed by oncology.  Recently at [facility] with plans for possible stem cell harvest but no plans for bone marrow biopsy at that time.  Kidney function at 24% per nephrology.  At his last visit he was doing well, kidney function stable.  Stem cell harvest in June and still no decision for bone marrow transplant.  Hematochezia improved significantly since stopping aspirin and starting Preparation H.  No other GI complaints at that time.  Recommended continue current regimen, notify us if Preparation H stops working and we could send in [place], follow-up with [facility] as they recommend, follow-up in 6 months in our office.  Today he states   Past Medical History:  Diagnosis Date  . Cancer ([place])    multiple myeloma  . Cancer of right kidney ([place])   . Cervical dystonia     . Diabetes mellitus without complication [facility])     Past Surgical History:  Procedure Laterality Date  . BONE MARROW BIOPSY    . CHOLECYSTECTOMY  2007  . COLONOSCOPY WITH PROPOFOL N/A 01/16/2018   Procedure: COLONOSCOPY WITH PROPOFOL;  Surgeon: Daneil Dolin, MD;  [facility];  Service: Endoscopy;  Laterality: N/A;  1:45pm  . ESOPHAGOGASTRODUODENOSCOPY (EGD) WITH PROPOFOL N/A 01/16/2018   Procedure: ESOPHAGOGASTRODUODENOSCOPY (EGD) WITH PROPOFOL;  Surgeon: Daneil Dolin, MD;  [facility];  Service: Endoscopy;  Laterality: N/A;  . GIVENS CAPSULE STUDY N/A 05/12/2018   Procedure: GIVENS CAPSULE STUDY;  Surgeon: Daneil Dolin, MD;  [facility];  Service: Endoscopy;  Laterality: N/A;  7:30am  . NEPHRECTOMY Right 1998   cancer    Current Outpatient Medications  Medication Sig Dispense Refill  . acyclovir (ZOVIRAX) 200 MG capsule Take 1 capsule (200 mg total) by mouth 2 (two) times daily. 60 capsule 2  . allopurinol (ZYLOPRIM) 300 MG tablet Take 300 mg by mouth every morning.    [name] apixaban (ELIQUIS) 2.5 MG TABS tablet After completion of starter pack on D8 begin 2.5 po  mg bid 60 tablet 2  . B-D UF III MINI PEN NEEDLES 31G X 5 MM MISC   4  . cyclobenzaprine (FLEXERIL) 10 MG tablet Take 10 mg by mouth at bedtime.    . furosemide (LASIX) 40 MG tablet Take 40 mg by mouth every Monday, Wednesday, and Friday.     . gabapentin (NEURONTIN) 300 MG capsule TAKE 1 CAPSULE  BY MOUTH AT BEDTIME 30 capsule 4  . glipiZIDE (GLUCOTROL XL) 5 MG 24 hr tablet Take 10 mg by mouth daily with breakfast.     . lenalidomide (REVLIMID) 2.5 MG capsule Take 1 capsule (2.5 mg total) by mouth daily. 30 capsule 0  . Multiple Vitamins-Minerals (CENTRUM SILVER 50+MEN) TABS Take 1 tablet by mouth every morning.    . pravastatin (PRAVACHOL) 20 MG tablet Take 20 mg by mouth at bedtime.     . sodium bicarbonate 650 MG tablet Take 1 tablet (650 mg total) by mouth 2 (two) times daily.  (Patient taking differently: Take 650 mg by mouth 3 (three) times daily. ) 60 tablet 1  . TRESIBA FLEXTOUCH 200 UNIT/ML SOPN      No current facility-administered medications for this visit.    Facility-Administered Medications Ordered in Other Visits  Medication Dose Route Frequency Provider Last Rate Last Dose  . 0.9 %  sodium chloride infusion  250 mL Intravenous Once Twana First, MD        Allergies as of 12/23/2018  . (No Known Allergies)    Family History  Problem Relation Age of Onset  . Heart failure Mother 19  . Dementia Father   . Colon cancer Neg Hx     Social History   Socioeconomic History  . Marital status: Single    Spouse name: Not on file  . Number of children: Not on file  . Years of education: Not on file  . Highest education level: Not on file  Occupational History  . Occupation: Marine scientist, Medical laboratory scientific officer Needs  . Financial resource strain: Not on file  . Food insecurity:    Worry: Not on file    Inability: Not on file  . Transportation needs:    Medical: Not on file    Non-medical: Not on file  Tobacco Use  . Smoking status: Never Smoker  . Smokeless tobacco: Former Systems developer    Types: Chew  Substance and Sexual Activity  . Alcohol use: No  . Drug use: No  . Sexual activity: Not Currently  Lifestyle  . Physical activity:    Days per week: Not on file    Minutes per session: Not on file  . Stress: Not on file  Relationships  . Social connections:    Talks on phone: Not on file    Gets together: Not on file    Attends religious service: Not on file    Active member of club or organization: Not on file    Attends meetings of clubs or organizations: Not on file    Relationship status: Not on file  Other Topics Concern  . Not on file  Social History Narrative  . Not on file    Review of Systems: General: Negative for anorexia, weight loss, fever, chills, fatigue, weakness. Eyes: Negative for vision changes.  ENT: Negative for  hoarseness, difficulty swallowing , nasal congestion. CV: Negative for chest pain, angina, palpitations, dyspnea on exertion, peripheral edema.  Respiratory: Negative for dyspnea at rest, dyspnea on exertion, cough, sputum, wheezing.  GI: See history of present illness. GU:  Negative for dysuria, hematuria, urinary incontinence, urinary frequency, nocturnal urination.  MS: Negative for joint pain, low back pain.  Derm: Negative for rash or itching.  Neuro: Negative for weakness, abnormal sensation, seizure, frequent headaches, memory loss, confusion.  Psych: Negative for anxiety, depression, suicidal ideation, hallucinations.  Endo: Negative for unusual weight change.  Heme: Negative for bruising or bleeding. Allergy:  Negative for rash or hives.   Physical Exam: There were no vitals taken for this visit. General:   Alert and oriented. Pleasant and cooperative. Well-nourished and well-developed.  Head:  Normocephalic and atraumatic. Eyes:  Without icterus, sclera clear and conjunctiva pink.  Ears:  Normal auditory acuity. Mouth:  No deformity or lesions, oral mucosa pink.  Throat/Neck:  Supple, without mass or thyromegaly. Cardiovascular:  S1, S2 present without murmurs appreciated. Normal pulses noted. Extremities without clubbing or edema. Respiratory:  Clear to auscultation bilaterally. No wheezes, rales, or rhonchi. No distress.  Gastrointestinal:  +BS, soft, non-tender and non-distended. No HSM noted. No guarding or rebound. No masses appreciated.  Rectal:  Deferred  Musculoskalatal:  Symmetrical without gross deformities. Normal posture. Skin:  Intact without significant lesions or rashes. Neurologic:  Alert and oriented x4;  grossly normal neurologically. Psych:  Alert and cooperative. Normal mood and affect. Heme/Lymph/Immune: No significant cervical adenopathy. No excessive bruising noted.    12/23/2018 8:00 AM   Disclaimer: This note was dictated with voice recognition  software. Similar sounding words can inadvertently be transcribed and may not be corrected upon review.

## 2018-12-24 DIAGNOSIS — C9000 Multiple myeloma not having achieved remission: Secondary | ICD-10-CM | POA: Diagnosis not present

## 2018-12-24 DIAGNOSIS — I10 Essential (primary) hypertension: Secondary | ICD-10-CM | POA: Diagnosis not present

## 2018-12-24 DIAGNOSIS — E8889 Other specified metabolic disorders: Secondary | ICD-10-CM | POA: Diagnosis not present

## 2018-12-24 DIAGNOSIS — R809 Proteinuria, unspecified: Secondary | ICD-10-CM | POA: Diagnosis not present

## 2018-12-24 DIAGNOSIS — N184 Chronic kidney disease, stage 4 (severe): Secondary | ICD-10-CM | POA: Diagnosis not present

## 2018-12-24 DIAGNOSIS — D638 Anemia in other chronic diseases classified elsewhere: Secondary | ICD-10-CM | POA: Diagnosis not present

## 2018-12-29 ENCOUNTER — Other Ambulatory Visit (HOSPITAL_COMMUNITY): Payer: Self-pay | Admitting: Internal Medicine

## 2018-12-29 DIAGNOSIS — G629 Polyneuropathy, unspecified: Secondary | ICD-10-CM

## 2019-01-13 ENCOUNTER — Other Ambulatory Visit: Payer: Self-pay

## 2019-01-13 ENCOUNTER — Inpatient Hospital Stay (HOSPITAL_COMMUNITY): Payer: Medicare Other | Attending: Internal Medicine

## 2019-01-13 DIAGNOSIS — D649 Anemia, unspecified: Secondary | ICD-10-CM | POA: Insufficient documentation

## 2019-01-13 DIAGNOSIS — Z86718 Personal history of other venous thrombosis and embolism: Secondary | ICD-10-CM | POA: Diagnosis not present

## 2019-01-13 DIAGNOSIS — E119 Type 2 diabetes mellitus without complications: Secondary | ICD-10-CM | POA: Diagnosis not present

## 2019-01-13 DIAGNOSIS — D7589 Other specified diseases of blood and blood-forming organs: Secondary | ICD-10-CM

## 2019-01-13 DIAGNOSIS — C9000 Multiple myeloma not having achieved remission: Secondary | ICD-10-CM | POA: Insufficient documentation

## 2019-01-13 DIAGNOSIS — Z7901 Long term (current) use of anticoagulants: Secondary | ICD-10-CM | POA: Diagnosis not present

## 2019-01-13 DIAGNOSIS — Z7984 Long term (current) use of oral hypoglycemic drugs: Secondary | ICD-10-CM | POA: Diagnosis not present

## 2019-01-13 DIAGNOSIS — Z79899 Other long term (current) drug therapy: Secondary | ICD-10-CM | POA: Insufficient documentation

## 2019-01-13 LAB — CBC WITH DIFFERENTIAL/PLATELET
Abs Immature Granulocytes: 0.03 10*3/uL (ref 0.00–0.07)
Basophils Absolute: 0.1 10*3/uL (ref 0.0–0.1)
Basophils Relative: 1 %
Eosinophils Absolute: 0.2 10*3/uL (ref 0.0–0.5)
Eosinophils Relative: 4 %
HEMATOCRIT: 32.4 % — AB (ref 39.0–52.0)
Hemoglobin: 10.5 g/dL — ABNORMAL LOW (ref 13.0–17.0)
Immature Granulocytes: 1 %
LYMPHS ABS: 1.2 10*3/uL (ref 0.7–4.0)
Lymphocytes Relative: 22 %
MCH: 31.8 pg (ref 26.0–34.0)
MCHC: 32.4 g/dL (ref 30.0–36.0)
MCV: 98.2 fL (ref 80.0–100.0)
Monocytes Absolute: 0.6 10*3/uL (ref 0.1–1.0)
Monocytes Relative: 10 %
Neutro Abs: 3.3 10*3/uL (ref 1.7–7.7)
Neutrophils Relative %: 62 %
Platelets: 120 10*3/uL — ABNORMAL LOW (ref 150–400)
RBC: 3.3 MIL/uL — ABNORMAL LOW (ref 4.22–5.81)
RDW: 14.6 % (ref 11.5–15.5)
WBC: 5.3 10*3/uL (ref 4.0–10.5)
nRBC: 0 % (ref 0.0–0.2)

## 2019-01-13 LAB — FOLATE: Folate: 30.1 ng/mL (ref >5.9)

## 2019-01-13 LAB — COMPREHENSIVE METABOLIC PANEL
ALT: 28 U/L (ref 0–44)
AST: 18 U/L (ref 15–41)
Albumin: 4 g/dL (ref 3.5–5.0)
Alkaline Phosphatase: 124 U/L (ref 38–126)
Anion gap: 11 (ref 5–15)
BILIRUBIN TOTAL: 0.6 mg/dL (ref 0.3–1.2)
BUN: 37 mg/dL — AB (ref 8–23)
CO2: 21 mmol/L — ABNORMAL LOW (ref 22–32)
Calcium: 9 mg/dL (ref 8.9–10.3)
Chloride: 103 mmol/L (ref 98–111)
Creatinine, Ser: 2.91 mg/dL — ABNORMAL HIGH (ref 0.61–1.24)
GFR calc Af Amer: 25 mL/min — ABNORMAL LOW (ref >60)
GFR calc non Af Amer: 21 mL/min — ABNORMAL LOW (ref >60)
Glucose, Bld: 159 mg/dL — ABNORMAL HIGH (ref 70–99)
Potassium: 4 mmol/L (ref 3.5–5.1)
Sodium: 135 mmol/L (ref 135–145)
Total Protein: 7.4 g/dL (ref 6.5–8.1)

## 2019-01-13 LAB — FERRITIN: Ferritin: 666 ng/mL — ABNORMAL HIGH (ref 24–336)

## 2019-01-13 LAB — VITAMIN B12: Vitamin B-12: 372 pg/mL (ref 180–914)

## 2019-01-13 LAB — LACTATE DEHYDROGENASE: LDH: 99 U/L (ref 98–192)

## 2019-01-14 ENCOUNTER — Other Ambulatory Visit (HOSPITAL_COMMUNITY): Payer: Self-pay | Admitting: Internal Medicine

## 2019-01-14 LAB — KAPPA/LAMBDA LIGHT CHAINS
KAPPA FREE LGHT CHN: 100.2 mg/L — AB (ref 3.3–19.4)
KAPPA, LAMDA LIGHT CHAIN RATIO: 1.42 (ref 0.26–1.65)
Lambda free light chains: 70.6 mg/L — ABNORMAL HIGH (ref 5.7–26.3)

## 2019-01-14 LAB — PROTEIN ELECTROPHORESIS, SERUM
A/G Ratio: 1.5 (ref 0.7–1.7)
ALBUMIN ELP: 3.8 g/dL (ref 2.9–4.4)
ALPHA-2-GLOBULIN: 0.8 g/dL (ref 0.4–1.0)
Alpha-1-Globulin: 0.2 g/dL (ref 0.0–0.4)
Beta Globulin: 0.8 g/dL (ref 0.7–1.3)
Gamma Globulin: 0.8 g/dL (ref 0.4–1.8)
Globulin, Total: 2.6 g/dL (ref 2.2–3.9)
Total Protein ELP: 6.4 g/dL (ref 6.0–8.5)

## 2019-01-14 LAB — IMMUNOFIXATION ELECTROPHORESIS
IgA: 395 mg/dL (ref 61–437)
IgG (Immunoglobin G), Serum: 788 mg/dL (ref 700–1600)
IgM (Immunoglobulin M), Srm: 42 mg/dL (ref 20–172)
Total Protein ELP: 6.4 g/dL (ref 6.0–8.5)

## 2019-01-14 LAB — IGG, IGA, IGM
IgA: 383 mg/dL (ref 61–437)
IgG (Immunoglobin G), Serum: 798 mg/dL (ref 700–1600)
IgM (Immunoglobulin M), Srm: 36 mg/dL (ref 20–172)

## 2019-01-14 NOTE — Telephone Encounter (Signed)
No pending F/U appointments.  Please advise refill.

## 2019-01-15 LAB — METHYLMALONIC ACID, SERUM: Methylmalonic Acid, Quantitative: 575 nmol/L — ABNORMAL HIGH (ref 0–378)

## 2019-01-15 MED ORDER — ACYCLOVIR 200 MG PO CAPS: 200.0000 mg | ORAL_CAPSULE | Freq: Two times a day (BID) | ORAL | 2 refills | Status: DC

## 2019-01-15 NOTE — Addendum Note (Signed)
Addended by: Donetta Potts on: 01/15/2019 09:07 AM   Modules accepted: Orders

## 2019-01-16 ENCOUNTER — Other Ambulatory Visit (HOSPITAL_COMMUNITY): Payer: Self-pay | Admitting: *Deleted

## 2019-01-16 DIAGNOSIS — C9000 Multiple myeloma not having achieved remission: Secondary | ICD-10-CM

## 2019-01-16 MED ORDER — LENALIDOMIDE 2.5 MG PO CAPS: 2.5000 mg | ORAL_CAPSULE | Freq: Every day | ORAL | 0 refills | Status: DC

## 2019-01-16 NOTE — Telephone Encounter (Signed)
Chart reviewed revlimid refilled.

## 2019-01-20 ENCOUNTER — Other Ambulatory Visit: Payer: Self-pay

## 2019-01-20 ENCOUNTER — Inpatient Hospital Stay (HOSPITAL_BASED_OUTPATIENT_CLINIC_OR_DEPARTMENT_OTHER): Payer: Medicare Other | Admitting: Hematology

## 2019-01-20 ENCOUNTER — Encounter (HOSPITAL_COMMUNITY): Payer: Self-pay | Admitting: Hematology

## 2019-01-20 ENCOUNTER — Ambulatory Visit (HOSPITAL_COMMUNITY): Payer: Medicare Other | Admitting: Hematology

## 2019-01-20 VITALS — BP 139/53 | HR 81 | Temp 97.7°F | Resp 18 | Wt 221.0 lb

## 2019-01-20 DIAGNOSIS — Z86718 Personal history of other venous thrombosis and embolism: Secondary | ICD-10-CM

## 2019-01-20 DIAGNOSIS — E119 Type 2 diabetes mellitus without complications: Secondary | ICD-10-CM | POA: Diagnosis not present

## 2019-01-20 DIAGNOSIS — Z7984 Long term (current) use of oral hypoglycemic drugs: Secondary | ICD-10-CM | POA: Diagnosis not present

## 2019-01-20 DIAGNOSIS — Z7901 Long term (current) use of anticoagulants: Secondary | ICD-10-CM | POA: Diagnosis not present

## 2019-01-20 DIAGNOSIS — Z79899 Other long term (current) drug therapy: Secondary | ICD-10-CM | POA: Diagnosis not present

## 2019-01-20 DIAGNOSIS — C9000 Multiple myeloma not having achieved remission: Secondary | ICD-10-CM

## 2019-01-20 DIAGNOSIS — D649 Anemia, unspecified: Secondary | ICD-10-CM

## 2019-01-20 MED ORDER — LENALIDOMIDE 2.5 MG PO CAPS: 2.5000 mg | ORAL_CAPSULE | Freq: Every day | ORAL | 1 refills | Status: DC

## 2019-01-20 NOTE — Progress Notes (Signed)
[place], [place] [address]   CLINIC:  Medical Oncology/Hematology  PCP:  Asencion Noble, MD [address] [place] [address] [phone]   REASON FOR VISIT:  Follow-up for Multiple myeloma not having achieved remission, macrocytosis   BRIEF ONCOLOGIC HISTORY:    Multiple myeloma ([place])   08/29/2017 Initial Diagnosis    Multiple myeloma ([place])    12/02/2017 -  Chemotherapy    The patient had bortezomib SQ (VELCADE) chemo injection 2.75 mg, 1.3 mg/m2 = 2.75 mg, Subcutaneous,  Once, 5 of 5 cycles Administration: 2.75 mg (12/10/2017), 2.75 mg (12/17/2017), 2.75 mg (12/31/2017), 2.75 mg (01/21/2018), 2.75 mg (02/18/2018), 2.75 mg (03/11/2018)  for chemotherapy treatment.       CANCER STAGING: Cancer Staging No matching staging information was found for the patient.   INTERVAL HISTORY:  Tom Marshall 68 y.o. male returns for routine follow-up. He is here today alone. He states that he has some trouble with sleep. He states that he has been doing great since his last visit. Denies any nausea, vomiting, or diarrhea. Denies any new pains. Had not noticed any recent bleeding such as epistaxis, hematuria or hematochezia. Denies recent chest pain on exertion, shortness of breath on minimal exertion, pre-syncopal episodes, or palpitations. Denies any numbness or tingling in hands or feet. Denies any recent fevers, infections, or recent hospitalizations. Patient reports appetite at 100% and energy level at 75%.   REVIEW OF SYSTEMS:  Review of Systems  Psychiatric/Behavioral: Positive for sleep disturbance.     PAST MEDICAL/SURGICAL HISTORY:  Past Medical History:  Diagnosis Date  . Cancer ([place])    multiple myeloma  . Cancer of right kidney ([place])   . Cervical dystonia   . Diabetes mellitus without complication [facility])    Past Surgical History:  Procedure Laterality Date  . BONE MARROW BIOPSY    . CHOLECYSTECTOMY  2007  . COLONOSCOPY WITH  PROPOFOL N/A 01/16/2018   Procedure: COLONOSCOPY WITH PROPOFOL;  Surgeon: Daneil Dolin, MD;  Location[facility];  Service: Endoscopy;  Laterality: N/A;  1:45pm  . ESOPHAGOGASTRODUODENOSCOPY (EGD) WITH PROPOFOL N/A 01/16/2018   Procedure: ESOPHAGOGASTRODUODENOSCOPY (EGD) WITH PROPOFOL;  Surgeon: Daneil Dolin, MD;  Location: [facility];  Service: Endoscopy;  Laterality: N/A;  . GIVENS CAPSULE STUDY N/A 05/12/2018   Procedure: GIVENS CAPSULE STUDY;  Surgeon: Daneil Dolin, MD;  Location: [facility];  Service: Endoscopy;  Laterality: N/A;  7:30am  . NEPHRECTOMY Right 1998   cancer     SOCIAL HISTORY:  Social History   Socioeconomic History  . Marital status: Single    Spouse name: Not on file  . Number of children: Not on file  . Years of education: Not on file  . Highest education level: Not on file  Occupational History  . Occupation: Marine scientist, Medical laboratory scientific officer Needs  . Financial resource strain: Not on file  . Food insecurity:    Worry: Not on file    Inability: Not on file  . Transportation needs:    Medical: Not on file    Non-medical: Not on file  Tobacco Use  . Smoking status: Never Smoker  . Smokeless tobacco: Former Systems developer    Types: Chew  Substance and Sexual Activity  . Alcohol use: No  . Drug use: No  . Sexual activity: Not Currently  Lifestyle  . Physical activity:    Days per week: Not on file    Minutes per  session: Not on file  . Stress: Not on file  Relationships  . Social connections:    Talks on phone: Not on file    Gets together: Not on file    Attends religious service: Not on file    Active member of club or organization: Not on file    Attends meetings of clubs or organizations: Not on file    Relationship status: Not on file  . Intimate partner violence:    Fear of current or ex partner: Not on file    Emotionally abused: Not on file    Physically abused: Not on file    Forced sexual activity: Not on file  Other Topics  Concern  . Not on file  Social History Narrative  . Not on file    FAMILY HISTORY:  Family History  Problem Relation Age of Onset  . Heart failure Mother 22  . Dementia Father   . Colon cancer Neg Hx     CURRENT MEDICATIONS:  Outpatient Encounter Medications as of 01/20/2019  Medication Sig  . acyclovir (ZOVIRAX) 200 MG capsule Take 1 capsule (200 mg total) by mouth 2 (two) times daily.  [name] allopurinol (ZYLOPRIM) 300 MG tablet Take 300 mg by mouth every morning.  [name] apixaban (ELIQUIS) 2.5 MG TABS tablet After completion of starter pack on D8 begin 2.5 po  mg bid  . B-D UF III MINI PEN NEEDLES 31G X 5 MM MISC   . cyclobenzaprine (FLEXERIL) 10 MG tablet Take 10 mg by mouth at bedtime.  . furosemide (LASIX) 40 MG tablet Take 40 mg by mouth every Monday, Wednesday, and Friday.   . gabapentin (NEURONTIN) 300 MG capsule TAKE 1 CAPSULE BY MOUTH AT BEDTIME  . glipiZIDE (GLUCOTROL XL) 5 MG 24 hr tablet Take 10 mg by mouth daily with breakfast.   . lenalidomide (REVLIMID) 2.5 MG capsule Take 1 capsule (2.5 mg total) by mouth daily.  . Multiple Vitamins-Minerals (CENTRUM SILVER 50+MEN) TABS Take 1 tablet by mouth every morning.  . pravastatin (PRAVACHOL) 20 MG tablet Take 20 mg by mouth at bedtime.   . sodium bicarbonate 650 MG tablet Take 1 tablet (650 mg total) by mouth 2 (two) times daily. (Patient taking differently: Take 650 mg by mouth 4 (four) times daily. Pt takes two  in am and two at night)  . TRESIBA FLEXTOUCH 200 UNIT/ML SOPN Inject 32 Units into the skin daily.   . [DISCONTINUED] lenalidomide (REVLIMID) 2.5 MG capsule Take 1 capsule (2.5 mg total) by mouth daily.   Facility-Administered Encounter Medications as of 01/20/2019  Medication  . 0.9 %  sodium chloride infusion    ALLERGIES:  No Known Allergies   PHYSICAL EXAM:  ECOG Performance status: 1  Vitals:   01/20/19 1026  BP: (!) 139/53  Pulse: 81  Resp: 18  Temp: 97.7 F (36.5 C)  SpO2: 99%   Filed Weights    01/20/19 1026  Weight: 221 lb (100.2 kg)    Physical Exam Constitutional:      Appearance: Normal appearance.  Cardiovascular:     Rate and Rhythm: Normal rate and regular rhythm.     Heart sounds: Normal heart sounds.  Pulmonary:     Effort: Pulmonary effort is normal.     Breath sounds: Normal breath sounds.  Abdominal:     Palpations: Abdomen is soft. There is no mass.     Tenderness: There is no abdominal tenderness.  Musculoskeletal:        General:  No swelling.  Skin:    General: Skin is warm.  Neurological:     General: No focal deficit present.     Mental Status: He is alert and oriented to person, place, and time.  Psychiatric:        Mood and Affect: Mood normal.        Behavior: Behavior normal.      LABORATORY DATA:  I have reviewed the labs as listed.  CBC    Component Value Date/Time   WBC 5.3 01/13/2019 1225   RBC 3.30 (L) 01/13/2019 1225   HGB 10.5 (L) 01/13/2019 1225   HCT 32.4 (L) 01/13/2019 1225   PLT 120 (L) 01/13/2019 1225   MCV 98.2 01/13/2019 1225   MCH 31.8 01/13/2019 1225   MCHC 32.4 01/13/2019 1225   RDW 14.6 01/13/2019 1225   LYMPHSABS 1.2 01/13/2019 1225   MONOABS 0.6 01/13/2019 1225   EOSABS 0.2 01/13/2019 1225   BASOSABS 0.1 01/13/2019 1225   CMP Latest Ref Rng & Units 01/13/2019 11/18/2018 10/21/2018  Glucose 70 - 99 mg/dL 159(H) 127(H) 268(H)  BUN 8 - 23 mg/dL 37(H) 34(H) 38(H)  Creatinine 0.61 - 1.24 mg/dL 2.91(H) 2.94(H) 3.15(H)  Sodium 135 - 145 mmol/L 135 138 135  Potassium 3.5 - 5.1 mmol/L 4.0 4.2 4.0  Chloride 98 - 111 mmol/L 103 104 102  CO2 22 - 32 mmol/L 21(L) 23 24  Calcium 8.9 - 10.3 mg/dL 9.0 9.0 8.8(L)  Total Protein 6.5 - 8.1 g/dL 7.4 6.9 6.9  Total Bilirubin 0.3 - 1.2 mg/dL 0.6 0.6 0.7  Alkaline Phos 38 - 126 U/L 124 90 105  AST 15 - 41 U/L _0 ALT 0 - 44 U/L _1 DIAGNOSTIC IMAGING:  I have independently reviewed the scans and discussed with the patient.   I have reviewed Venita Lick LPN's note and agree with the documentation.  I personally performed a face-to-face visit, made revisions and my assessment and plan is as follows.    ASSESSMENT & PLAN:   Multiple myeloma ([place]) 1.  IgA kappa multiple myeloma: - Diagnosis 08/15/2017, bone marrow biopsy shows 95% involvement.  63, XY.  FISH panel consistent with +14/IGH break and 13 q. minus/-13. - 4 cycles of RVD initiated on 08/30/2017 through 02/18/2018. - Bone marrow biopsy on 02/27/2018 showed hypocellular marrow with trilineage hematopoiesis, plasma cells less than 1%.  No definitive evidence of myeloma. - He was evaluated by Dr. Alvie Heidelberg at [facility] for bone marrow transplant.  Patient declined to have it done. -Revlimid maintenance 2.5 mg daily was initiated.  He is tolerating it very well.  Denies any GI side effects. 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  Kappa light chains were stable at 100.2 and free light chain ratio of 1.42. - His kidney function is also stable. - I plan to see him back in 2 months with repeat myeloma panel.  He will continue Revlimid maintenance.  2.  Left leg DVT: - Diagnosed on 10/07/2018. -He is currently on Eliquis 2.5 mg twice daily.  Denies any bleeding issues. -Takes Lasix 40 mg Monday Wednesday Friday as needed.  3.  Normocytic anemia: - Hemoglobin is 10.5.  Ferritin was 666.  C48 and folic acid were normal. - We will repeat at next visit.  Will consider erythropoiesis stimulating agents if it drops significantly.  4.  Diabetes: - He  is on Tresiba 32 units daily.  He also takes glipizide 5 mg in the mornings.   Total time spent is 25 minutes with more than 50% of the time spent discussing disease management and coordination of care.    Orders placed this encounter:  Orders Placed This Encounter  Procedures  . Iron and TIBC  . Ferritin  . Protein electrophoresis, serum  . Kappa/lambda light  chains  . Lactate dehydrogenase  . CBC with Differential/Platelet  . Comprehensive metabolic panel  . Immunofixation electrophoresis      Derek Jack, MD [place] [phone]

## 2019-01-20 NOTE — Assessment & Plan Note (Signed)
1.  IgA kappa multiple myeloma: - Diagnosis 08/15/2017, bone marrow biopsy shows 95% involvement.  51, XY.  FISH panel consistent with +14/IGH break and 13 q. minus/-13. - 4 cycles of RVD initiated on 08/30/2017 through 02/18/2018. - Bone marrow biopsy on 02/27/2018 showed hypocellular marrow with trilineage hematopoiesis, plasma cells less than 1%.  No definitive evidence of myeloma. - He was evaluated by Dr. Alvie Heidelberg at [facility] for bone marrow transplant.  Patient declined to have it done. -Revlimid maintenance 2.5 mg daily was initiated.  He is tolerating it very well.  Denies any GI side effects. 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  Kappa light chains were stable at 100.2 and free light chain ratio of 1.42. - His kidney function is also stable. - I plan to see him back in 2 months with repeat myeloma panel.  He will continue Revlimid maintenance.  2.  Left leg DVT: - Diagnosed on 10/07/2018. -He is currently on Eliquis 2.5 mg twice daily.  Denies any bleeding issues. -Takes Lasix 40 mg Monday Wednesday Friday as needed.  3.  Normocytic anemia: - Hemoglobin is 10.5.  Ferritin was 666.  Z60 and folic acid were normal. - We will repeat at next visit.  Will consider erythropoiesis stimulating agents if it drops significantly.  4.  Diabetes: - He is on [place] 32 units daily.  He also takes glipizide 5 mg in the mornings.

## 2019-01-20 NOTE — Progress Notes (Signed)
Pt is taking Revlimid as prescribed.  Pt did have a blood clot, but Dr. Walden Field started the pt on eliquis.  Per pt, no other symptoms to report.

## 2019-01-20 NOTE — Patient Instructions (Addendum)
[facility] at [facility] Discharge Instructions  You were seen today by Dr. Katragadda. He went over your recent lab results. He will see you back in 2 months for labs and follow up.   Thank you for choosing [facility] at [facility] to provide your oncology and hematology care.  To afford each patient quality time with our provider, please arrive at least 15 minutes before your scheduled appointment time.   If you have a lab appointment with the Cancer Center please come in thru the  Main Entrance and check in at the main information desk  You need to re-schedule your appointment should you arrive 10 or more minutes late.  We strive to give you quality time with our providers, and arriving late affects you and other patients whose appointments are after yours.  Also, if you no show three or more times for appointments you may be dismissed from the clinic at the providers discretion.     Again, thank you for choosing [facility].  Our hope is that these requests will decrease the amount of time that you wait before being seen by our physicians.       _____________________________________________________________  Should you have questions after your visit to [facility], please contact our office at [phone] between the hours of 8:00 a.m. and 4:30 p.m.  Voicemails left after 4:00 p.m. will not be returned until the following business day.  For prescription refill requests, have your pharmacy contact our office and allow 72 hours.    Cancer Center Support Programs:   > Cancer Support Group  2nd Tuesday of the month 1pm-2pm, Journey Room    

## 2019-01-26 ENCOUNTER — Other Ambulatory Visit (HOSPITAL_COMMUNITY): Payer: Self-pay

## 2019-01-26 DIAGNOSIS — C9000 Multiple myeloma not having achieved remission: Secondary | ICD-10-CM

## 2019-01-26 MED ORDER — LENALIDOMIDE 2.5 MG PO CAPS: 2.5000 mg | ORAL_CAPSULE | Freq: Every day | ORAL | 1 refills | Status: DC

## 2019-01-29 NOTE — Progress Notes (Signed)
01/29/19 1600  Delivered Tom Marshall his Revlimid prescription which was delivered to [facility] by [employer].  Patient was hand delivered at the side short stay entrance due to no visitors in hospital.  He had no questions at the time.  Henreitta Leber, Sherian Rein D

## 2019-01-31 ENCOUNTER — Other Ambulatory Visit (HOSPITAL_COMMUNITY): Payer: Self-pay | Admitting: Internal Medicine

## 2019-02-02 NOTE — Telephone Encounter (Signed)
Tom Marshall refill request.

## 2019-02-18 ENCOUNTER — Other Ambulatory Visit (HOSPITAL_COMMUNITY): Payer: Self-pay | Admitting: *Deleted

## 2019-02-18 DIAGNOSIS — C9000 Multiple myeloma not having achieved remission: Secondary | ICD-10-CM

## 2019-02-18 IMAGING — CT CT ABD-PELV W/O CM
2 of 4 series · 16 of 46 positions shown, 18 images · non-contrast
Comparison: CT chest abdomen pelvis 01/25/2009

CLINICAL DATA: Mid chest on lower back pain with movement for 1
week, tachycardia, pale nose, history of renal cell carcinoma post
RIGHT nephrectomy, diabetes mellitus, prior cholecystectomy

EXAM:
CT ABDOMEN AND PELVIS WITHOUT CONTRAST
TECHNIQUE: Multidetector CT imaging of the abdomen and pelvis was performed
following the standard protocol without IV contrast. Sagittal and
coronal MPR images reconstructed from axial data set. No oral
contrast was administered.

[Series 2: axial st · axial · 0.98mm/px · z∈[-643,-218]mm · 13 of 95 slices shown, 15 images]
[im 5/95  soft-tissue]
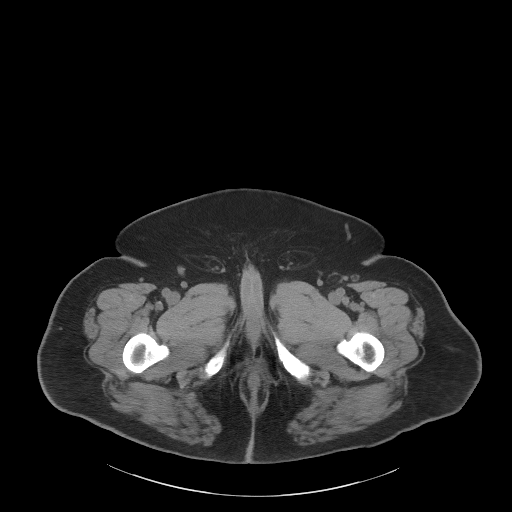
[im 5/95  bone]
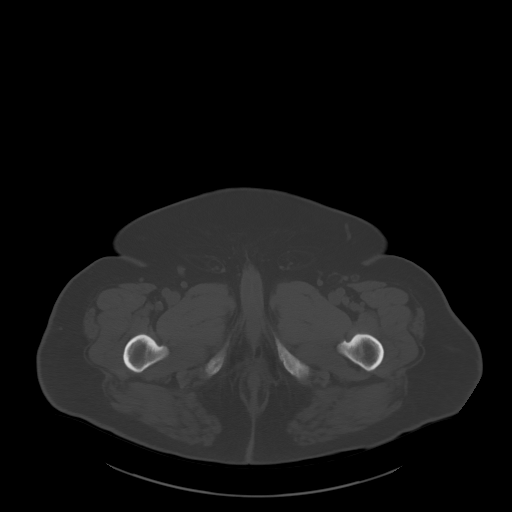
[im 13/95  soft-tissue]
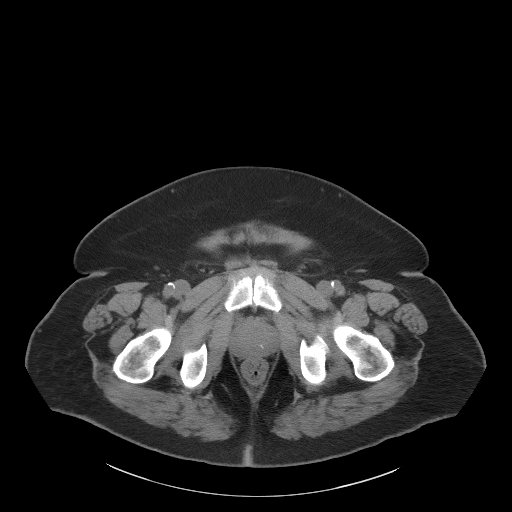
[im 21/95  soft-tissue]
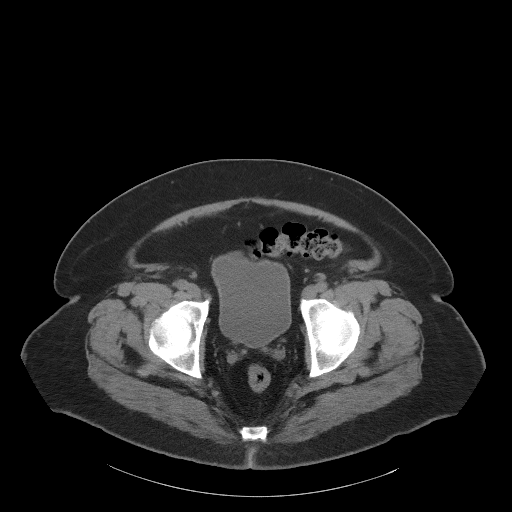
[im 25/95  soft-tissue]
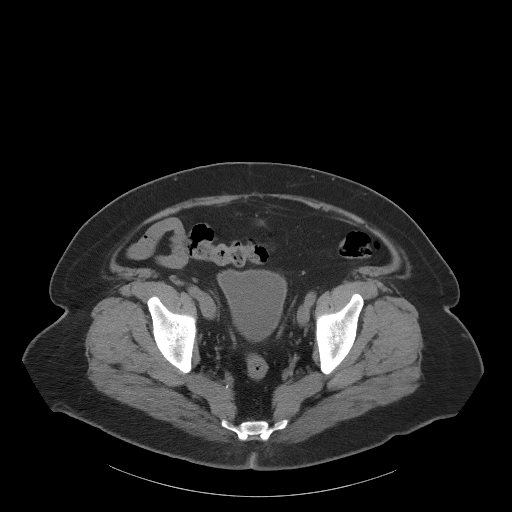
[im 33/95  soft-tissue]
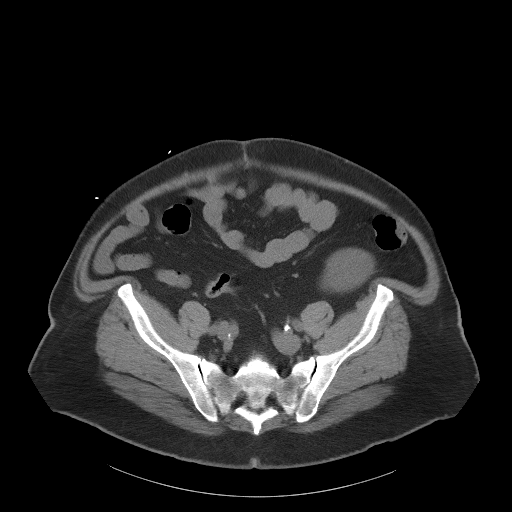
[im 41/95  soft-tissue]
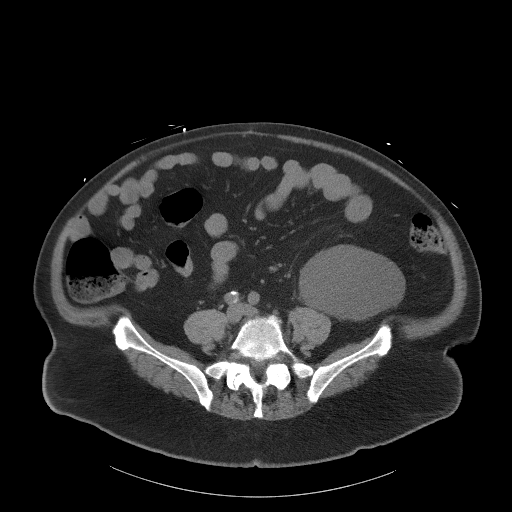
[im 50/95  soft-tissue]
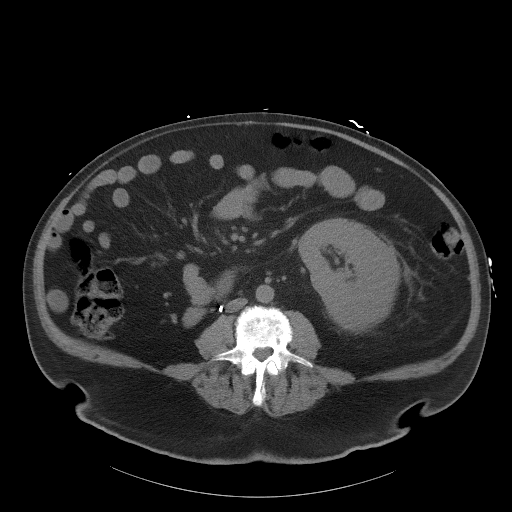
[im 54/95  soft-tissue]
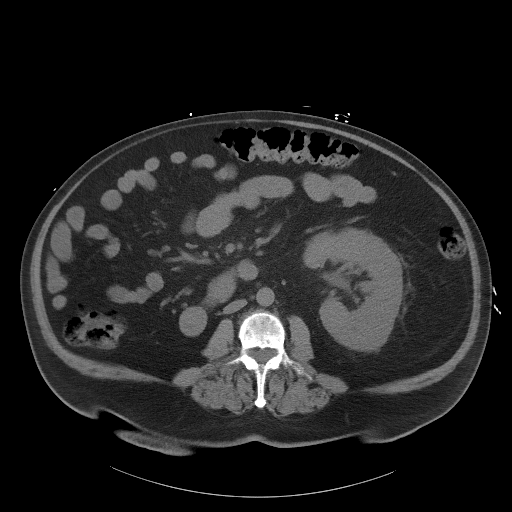
[im 62/95  soft-tissue]
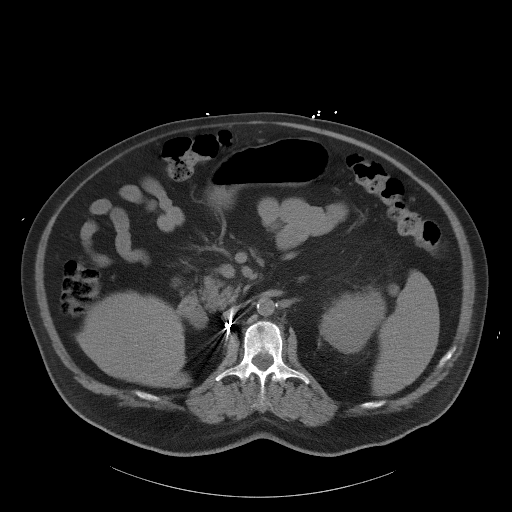
[im 62/95  bone]
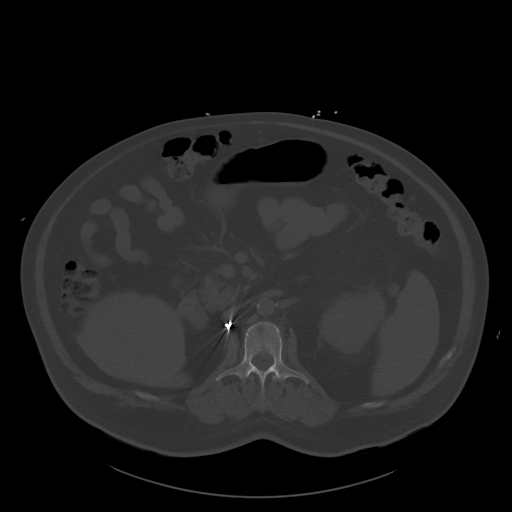
[im 70/95  soft-tissue]
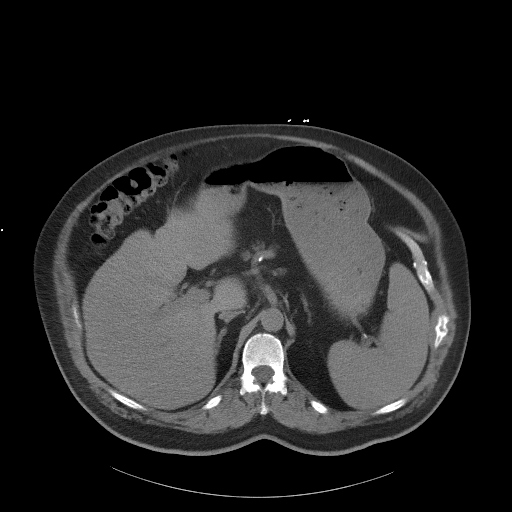
[im 74/95  soft-tissue]
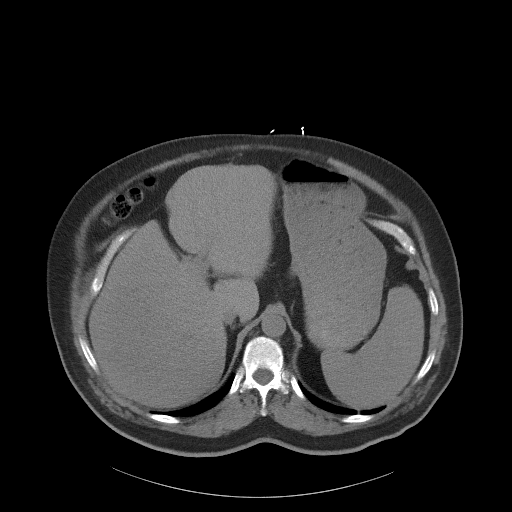
[im 82/95  soft-tissue]
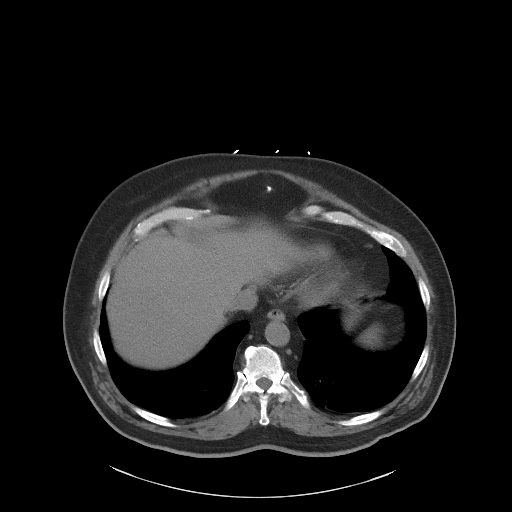
[im 90/95  soft-tissue]
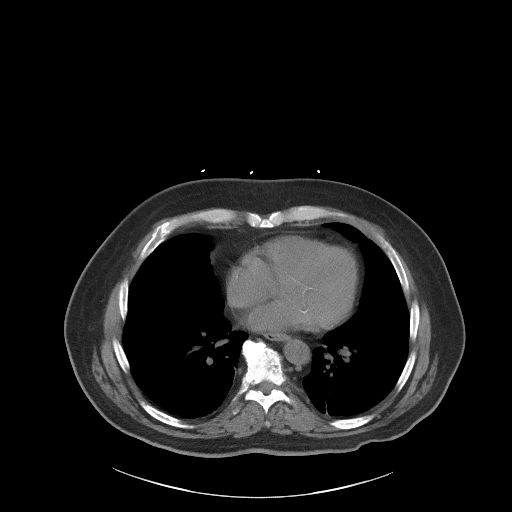

[Series 5: coronal st · coronal · 0.91mm/px · 3 of 109 slices shown]
[im 37/109  soft-tissue]
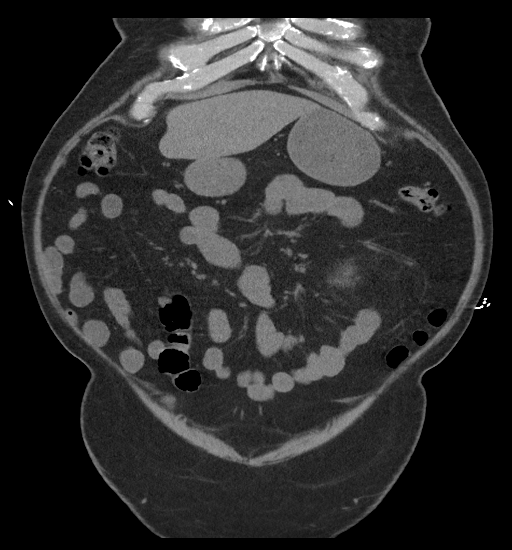
[im 49/109  soft-tissue]
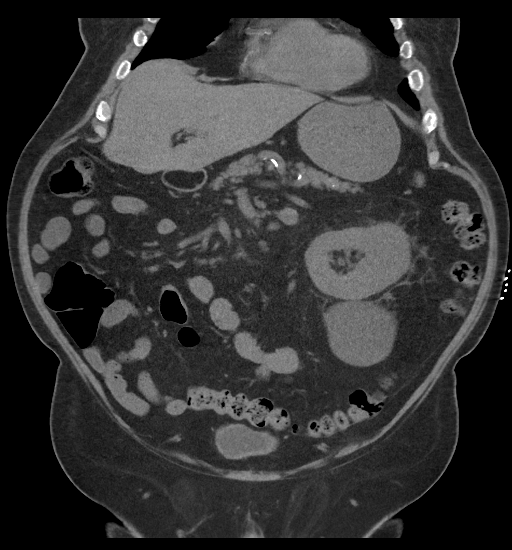
[im 61/109  soft-tissue]
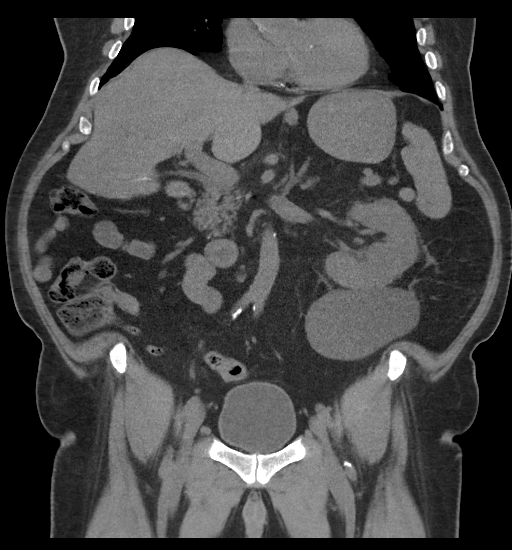

[16 of 46 positions shown; findings below may reference images not displayed]

FINDINGS: Lower chest: Linear subsegmental atelectasis in BILATERAL lower
lobes.

Hepatobiliary: Gallbladder surgically absent. No focal hepatic
abnormality.

Pancreas: Normal appearance

Spleen: Normal appearance. Small splenule adjacent to medial border
of spleen

Adrenals/Urinary Tract: Adrenal glands normal. Prior RIGHT
nephrectomy. Enlarged LEFT kidney consistent with compensatory
hypertrophy. Large cyst at inferior pole 10.4 x 7.3 x 6.5 cm
increased since previous study when it measured 6.0 x 4.4 x 5.5 cm.
Ureters and bladder unremarkable.

Stomach/Bowel: Normal appendix. Redundant sigmoid loop. Stomach and
bowel loops otherwise unremarkable.

Vascular/Lymphatic: Atherosclerotic calcifications aorta, iliac
arteries, and coronary arteries. Aorta normal caliber. No
adenopathy.

Reproductive: Unremarkable prostate gland and seminal vesicles

Other: No free air or free fluid. Tiny inguinal hernias containing
fat. No ventral hernia seen.

Musculoskeletal: Scattered degenerative disc disease changes lower
lumbar spine greatest at L3-L4. A number of question a subtle lytic
lesions are identified within thoracolumbar vertebra, subtle lytic
metastases not excluded. Fracture identified at the LEFT transverse
process of L3, new since 4262.
IMPRESSION: Interval increase in size of a large cyst the inferior pole of the
LEFT kidney 10.4 cm greatest size.

Questionable subtle lytic lesions within thoracic and lumbar
vertebra; consider follow-up MR assessment to exclude subtle lytic
metastases.

Interval fracture of the LEFT transverse process of L3 since 4262.

Tiny BILATERAL inguinal hernias containing fat.

## 2019-02-18 MED ORDER — LENALIDOMIDE 2.5 MG PO CAPS: 2.5000 mg | ORAL_CAPSULE | Freq: Every day | ORAL | 1 refills | Status: DC

## 2019-02-27 ENCOUNTER — Other Ambulatory Visit (HOSPITAL_COMMUNITY): Payer: Self-pay | Admitting: Internal Medicine

## 2019-02-27 DIAGNOSIS — G629 Polyneuropathy, unspecified: Secondary | ICD-10-CM

## 2019-03-24 ENCOUNTER — Other Ambulatory Visit: Payer: Self-pay

## 2019-03-24 ENCOUNTER — Other Ambulatory Visit (HOSPITAL_COMMUNITY): Payer: Self-pay | Admitting: *Deleted

## 2019-03-24 ENCOUNTER — Inpatient Hospital Stay (HOSPITAL_COMMUNITY): Payer: Medicare Other | Attending: Hematology

## 2019-03-24 ENCOUNTER — Other Ambulatory Visit (HOSPITAL_COMMUNITY)
Admission: RE | Admit: 2019-03-24 | Discharge: 2019-03-24 | Disposition: A | Discharge status: Home / Self Care | Payer: Medicare Other | Source: Ambulatory Visit | Attending: Internal Medicine | Admitting: Internal Medicine

## 2019-03-24 DIAGNOSIS — E785 Hyperlipidemia, unspecified: Secondary | ICD-10-CM | POA: Insufficient documentation

## 2019-03-24 DIAGNOSIS — I10 Essential (primary) hypertension: Secondary | ICD-10-CM | POA: Diagnosis not present

## 2019-03-24 DIAGNOSIS — C9000 Multiple myeloma not having achieved remission: Secondary | ICD-10-CM

## 2019-03-24 DIAGNOSIS — E119 Type 2 diabetes mellitus without complications: Secondary | ICD-10-CM | POA: Insufficient documentation

## 2019-03-24 LAB — COMPREHENSIVE METABOLIC PANEL
ALT: 30 U/L (ref 0–44)
AST: 17 U/L (ref 15–41)
Albumin: 4 g/dL (ref 3.5–5.0)
Alkaline Phosphatase: 111 U/L (ref 38–126)
Anion gap: 13 (ref 5–15)
BUN: 43 mg/dL — ABNORMAL HIGH (ref 8–23)
CO2: 21 mmol/L — ABNORMAL LOW (ref 22–32)
Calcium: 9.1 mg/dL (ref 8.9–10.3)
Chloride: 102 mmol/L (ref 98–111)
Creatinine, Ser: 3.05 mg/dL — ABNORMAL HIGH (ref 0.61–1.24)
GFR calc Af Amer: 23 mL/min — ABNORMAL LOW (ref >60)
GFR calc non Af Amer: 20 mL/min — ABNORMAL LOW (ref >60)
Glucose, Bld: 161 mg/dL — ABNORMAL HIGH (ref 70–99)
Potassium: 4.4 mmol/L (ref 3.5–5.1)
Sodium: 136 mmol/L (ref 135–145)
Total Bilirubin: 0.8 mg/dL (ref 0.3–1.2)
Total Protein: 7.5 g/dL (ref 6.5–8.1)

## 2019-03-24 LAB — CBC WITH DIFFERENTIAL/PLATELET
Abs Immature Granulocytes: 0.04 10*3/uL (ref 0.00–0.07)
Basophils Absolute: 0.1 10*3/uL (ref 0.0–0.1)
Basophils Relative: 1 %
Eosinophils Absolute: 0.3 10*3/uL (ref 0.0–0.5)
Eosinophils Relative: 6 %
HCT: 33.4 % — ABNORMAL LOW (ref 39.0–52.0)
Hemoglobin: 10.7 g/dL — ABNORMAL LOW (ref 13.0–17.0)
Immature Granulocytes: 1 %
Lymphocytes Relative: 24 %
Lymphs Abs: 1.2 10*3/uL (ref 0.7–4.0)
MCH: 32 pg (ref 26.0–34.0)
MCHC: 32 g/dL (ref 30.0–36.0)
MCV: 100 fL (ref 80.0–100.0)
Monocytes Absolute: 0.6 10*3/uL (ref 0.1–1.0)
Monocytes Relative: 12 %
Neutro Abs: 2.9 10*3/uL (ref 1.7–7.7)
Neutrophils Relative %: 56 %
Platelets: 137 10*3/uL — ABNORMAL LOW (ref 150–400)
RBC: 3.34 MIL/uL — ABNORMAL LOW (ref 4.22–5.81)
RDW: 14.4 % (ref 11.5–15.5)
WBC: 5.1 10*3/uL (ref 4.0–10.5)
nRBC: 0 % (ref 0.0–0.2)

## 2019-03-24 LAB — LACTATE DEHYDROGENASE: LDH: 88 U/L — ABNORMAL LOW (ref 98–192)

## 2019-03-24 LAB — FERRITIN: Ferritin: 580 ng/mL — ABNORMAL HIGH (ref 24–336)

## 2019-03-24 LAB — IRON AND TIBC
Iron: 72 ug/dL (ref 45–182)
Saturation Ratios: 23 % (ref 17.9–39.5)
TIBC: 313 ug/dL (ref 250–450)
UIBC: 241 ug/dL

## 2019-03-24 LAB — HEMOGLOBIN A1C
Hgb A1c MFr Bld: 7.9 % — ABNORMAL HIGH (ref 4.8–5.6)
Mean Plasma Glucose: 180.03 mg/dL

## 2019-03-24 MED ORDER — LENALIDOMIDE 2.5 MG PO CAPS: 2.5000 mg | ORAL_CAPSULE | Freq: Every day | ORAL | 1 refills | Status: DC

## 2019-03-25 ENCOUNTER — Telehealth (HOSPITAL_COMMUNITY): Payer: Self-pay | Admitting: Hematology

## 2019-03-25 LAB — KAPPA/LAMBDA LIGHT CHAINS
Kappa free light chain: 122.3 mg/L — ABNORMAL HIGH (ref 3.3–19.4)
Kappa, lambda light chain ratio: 1.52 (ref 0.26–1.65)
Lambda free light chains: 80.3 mg/L — ABNORMAL HIGH (ref 5.7–26.3)

## 2019-03-25 NOTE — Telephone Encounter (Signed)
FAXED REVLIMID RX TO [facility]

## 2019-03-27 LAB — PROTEIN ELECTROPHORESIS, SERUM
A/G Ratio: 1.4 (ref 0.7–1.7)
Albumin ELP: 4 g/dL (ref 2.9–4.4)
Alpha-1-Globulin: 0.2 g/dL (ref 0.0–0.4)
Alpha-2-Globulin: 0.8 g/dL (ref 0.4–1.0)
Beta Globulin: 1 g/dL (ref 0.7–1.3)
Gamma Globulin: 0.8 g/dL (ref 0.4–1.8)
Globulin, Total: 2.9 g/dL (ref 2.2–3.9)
Total Protein ELP: 6.9 g/dL (ref 6.0–8.5)

## 2019-03-30 ENCOUNTER — Ambulatory Visit (HOSPITAL_COMMUNITY): Payer: Medicare Other | Admitting: Hematology

## 2019-03-30 DIAGNOSIS — E1129 Type 2 diabetes mellitus with other diabetic kidney complication: Secondary | ICD-10-CM | POA: Diagnosis not present

## 2019-03-30 DIAGNOSIS — C9000 Multiple myeloma not having achieved remission: Secondary | ICD-10-CM | POA: Diagnosis not present

## 2019-03-30 LAB — IMMUNOFIXATION ELECTROPHORESIS
IgA: 429 mg/dL (ref 61–437)
IgG (Immunoglobin G), Serum: 922 mg/dL (ref 603–1613)
IgM (Immunoglobulin M), Srm: 39 mg/dL (ref 20–172)
Total Protein ELP: 6.9 g/dL (ref 6.0–8.5)

## 2019-03-31 ENCOUNTER — Other Ambulatory Visit (HOSPITAL_COMMUNITY): Payer: Self-pay | Admitting: Hematology

## 2019-04-01 ENCOUNTER — Other Ambulatory Visit: Payer: Self-pay

## 2019-04-02 ENCOUNTER — Encounter (HOSPITAL_COMMUNITY): Payer: Self-pay | Admitting: Hematology

## 2019-04-02 ENCOUNTER — Inpatient Hospital Stay (HOSPITAL_COMMUNITY): Payer: Medicare Other | Attending: Internal Medicine | Admitting: Hematology

## 2019-04-02 DIAGNOSIS — Z79899 Other long term (current) drug therapy: Secondary | ICD-10-CM | POA: Insufficient documentation

## 2019-04-02 DIAGNOSIS — Z86718 Personal history of other venous thrombosis and embolism: Secondary | ICD-10-CM | POA: Diagnosis not present

## 2019-04-02 DIAGNOSIS — Z7984 Long term (current) use of oral hypoglycemic drugs: Secondary | ICD-10-CM | POA: Insufficient documentation

## 2019-04-02 DIAGNOSIS — Z7901 Long term (current) use of anticoagulants: Secondary | ICD-10-CM | POA: Diagnosis not present

## 2019-04-02 DIAGNOSIS — E1142 Type 2 diabetes mellitus with diabetic polyneuropathy: Secondary | ICD-10-CM | POA: Diagnosis not present

## 2019-04-02 DIAGNOSIS — D649 Anemia, unspecified: Secondary | ICD-10-CM | POA: Diagnosis not present

## 2019-04-02 DIAGNOSIS — C9000 Multiple myeloma not having achieved remission: Secondary | ICD-10-CM | POA: Insufficient documentation

## 2019-04-02 NOTE — Progress Notes (Signed)
[place], [place] [address]   CLINIC:  Medical Oncology/Hematology  PCP:  Tom Noble, MD [address] [place] [address] [phone]   REASON FOR VISIT:  Follow-up for Multiple Myeloma  CURRENT THERAPY: Maintenance Revlimid   BRIEF ONCOLOGIC HISTORY:    Multiple myeloma ([place])   08/29/2017 Initial Diagnosis    Multiple myeloma ([place] [facility])    12/02/2017 -  Chemotherapy    The patient had bortezomib SQ (VELCADE) chemo injection 2.75 mg, 1.3 mg/m2 = 2.75 mg, Subcutaneous,  Once, 5 of 5 cycles Administration: 2.75 mg (12/10/2017), 2.75 mg (12/17/2017), 2.75 mg (12/31/2017), 2.75 mg (01/21/2018), 2.75 mg (02/18/2018), 2.75 mg (03/11/2018)  for chemotherapy treatment.        INTERVAL HISTORY:  Tom Marshall 68 y.o. male presents today for follow up. He reports overall doing well. He denies any significant fatigue. Currently on maintenance Revlimid 2.5 mg, tolerating well. He continues with reports of peripheral neuropathy that is stable. He is able to complete all ADLS with out assistance. 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 He denies any new bone pain. Weight is stable. Denies any lower extremity edema. Denies any SOB, CP, lightheadedness or dizziness.    REVIEW OF SYSTEMS:  Review of Systems  Constitutional: Negative.   HENT:  Negative.   Eyes: Negative.   Respiratory: Negative.   Cardiovascular: Negative.   Gastrointestinal: Negative.   Endocrine: Negative.   Genitourinary: Negative.    Musculoskeletal: Negative.   Skin: Negative.   Neurological: Positive for numbness.  Psychiatric/Behavioral: Negative.      PAST MEDICAL/SURGICAL HISTORY:  Past Medical History:  Diagnosis Date  . Cancer ([place])    multiple myeloma  . Cancer of right kidney ([place])   . Cervical dystonia   . Diabetes mellitus without complication [facility])    Past Surgical History:  Procedure Laterality Date  . BONE MARROW BIOPSY    . CHOLECYSTECTOMY   2007  . COLONOSCOPY WITH PROPOFOL N/A 01/16/2018   Procedure: COLONOSCOPY WITH PROPOFOL;  Surgeon: Daneil Dolin, MD;  [facility];  Service: Endoscopy;  Laterality: N/A;  1:45pm  . ESOPHAGOGASTRODUODENOSCOPY (EGD) WITH PROPOFOL N/A 01/16/2018   Procedure: ESOPHAGOGASTRODUODENOSCOPY (EGD) WITH PROPOFOL;  Surgeon: Daneil Dolin, MD;  [facility];  Service: Endoscopy;  Laterality: N/A;  . GIVENS CAPSULE STUDY N/A 05/12/2018   Procedure: GIVENS CAPSULE STUDY;  Surgeon: Daneil Dolin, MD;  Location: [facility];  Service: Endoscopy;  Laterality: N/A;  7:30am  . NEPHRECTOMY Right 1998   cancer     SOCIAL HISTORY:  Social History   Socioeconomic History  . Marital status: Single    Spouse name: Not on file  . Number of children: Not on file  . Years of education: Not on file  . Highest education level: Not on file  Occupational History  . Occupation: Marine scientist, Medical laboratory scientific officer Needs  . Financial resource strain: Not on file  . Food insecurity:    Worry: Not on file    Inability: Not on file  . Transportation needs:    Medical: Not on file    Non-medical: Not on file  Tobacco Use  . Smoking status: Never Smoker  . Smokeless tobacco: Former Systems developer    Types: Chew  Substance and Sexual Activity  . Alcohol use: No  . Drug use: No  . Sexual activity: Not Currently  Lifestyle  . Physical activity:  Days per week: Not on file    Minutes per session: Not on file  . Stress: Not on file  Relationships  . Social connections:    Talks on phone: Not on file    Gets together: Not on file    Attends religious service: Not on file    Active member of club or organization: Not on file    Attends meetings of clubs or organizations: Not on file    Relationship status: Not on file  . Intimate partner violence:    Fear of current or ex partner: Not on file    Emotionally abused: Not on file    Physically abused: Not on file    Forced sexual activity:  Not on file  Other Topics Concern  . Not on file  Social History Narrative  . Not on file    FAMILY HISTORY:  Family History  Problem Relation Age of Onset  . Heart failure Mother 37  . Dementia Father   . Colon cancer Neg Hx     CURRENT MEDICATIONS:  Outpatient Encounter Medications as of 04/02/2019  Medication Sig  . acyclovir (ZOVIRAX) 200 MG capsule Take 1 capsule (200 mg total) by mouth 2 (two) times daily.  [name] allopurinol (ZYLOPRIM) 300 MG tablet Take 300 mg by mouth every morning.  . B-D UF III MINI PEN NEEDLES 31G X 5 MM MISC   . cyclobenzaprine (FLEXERIL) 10 MG tablet Take 10 mg by mouth at bedtime.  [name] ELIQUIS 2.5 MG TABS tablet TAKE 1 TABLET BY MOUTH TWICE DAILY AFTER COMPLETION OF THE STARTER PACK.  . furosemide (LASIX) 40 MG tablet Take 40 mg by mouth every Monday, Wednesday, and Friday.   . gabapentin (NEURONTIN) 300 MG capsule TAKE 1 CAPSULE BY MOUTH AT BEDTIME  . glipiZIDE (GLUCOTROL XL) 5 MG 24 hr tablet Take 10 mg by mouth daily with breakfast.   . lenalidomide (REVLIMID) 2.5 MG capsule Take 1 capsule (2.5 mg total) by mouth daily.  . Multiple Vitamins-Minerals (CENTRUM SILVER 50+MEN) TABS Take 1 tablet by mouth every morning.  . pravastatin (PRAVACHOL) 20 MG tablet Take 20 mg by mouth at bedtime.   . sodium bicarbonate 650 MG tablet Take 1 tablet (650 mg total) by mouth 2 (two) times daily. (Patient taking differently: Take 650 mg by mouth 4 (four) times daily. Pt takes two  in am and two at night)  . TRESIBA FLEXTOUCH 200 UNIT/ML SOPN Inject 32 Units into the skin daily.    Facility-Administered Encounter Medications as of 04/02/2019  Medication  . 0.9 %  sodium chloride infusion    ALLERGIES:  No Known Allergies   PHYSICAL EXAM:  ECOG Performance status: 1  Vitals:   04/02/19 1000  BP: 140/63  Pulse: 86  Resp: 16  Temp: 98.3 F (36.8 C)  SpO2: 97%   Filed Weights   04/02/19 1000  Weight: 216 lb 8 oz (98.2 kg)    Physical Exam Constitutional:       Appearance: Normal appearance. He is obese.  HENT:     Head: Normocephalic.     Nose: Nose normal.     Mouth/Throat:     Mouth: Mucous membranes are moist.     Pharynx: Oropharynx is clear.  Eyes:     Pupils: Pupils are equal, round, and reactive to light.  Neck:     Musculoskeletal: Normal range of motion.  Cardiovascular:     Rate and Rhythm: Normal rate and regular rhythm.  Pulses: Normal pulses.     Heart sounds: Normal heart sounds.  Pulmonary:     Effort: Pulmonary effort is normal.     Breath sounds: Normal breath sounds.  Abdominal:     General: Bowel sounds are normal.     Palpations: Abdomen is soft.  Musculoskeletal: Normal range of motion.  Skin:    General: Skin is warm and dry.  Neurological:     General: No focal deficit present.     Mental Status: He is alert and oriented to person, place, and time.  Psychiatric:        Mood and Affect: Mood normal.        Behavior: Behavior normal.        Thought Content: Thought content normal.        Judgment: Judgment normal.      LABORATORY DATA:  I have reviewed the labs as listed.  CBC    Component Value Date/Time   WBC 5.1 03/24/2019 1115   RBC 3.34 (L) 03/24/2019 1115   HGB 10.7 (L) 03/24/2019 1115   HCT 33.4 (L) 03/24/2019 1115   PLT 137 (L) 03/24/2019 1115   MCV 100.0 03/24/2019 1115   MCH 32.0 03/24/2019 1115   MCHC 32.0 03/24/2019 1115   RDW 14.4 03/24/2019 1115   LYMPHSABS 1.2 03/24/2019 1115   MONOABS 0.6 03/24/2019 1115   EOSABS 0.3 03/24/2019 1115   BASOSABS 0.1 03/24/2019 1115   CMP Latest Ref Rng & Units 03/24/2019 01/13/2019 11/18/2018  Glucose 70 - 99 mg/dL 161(H) 159(H) 127(H)  BUN 8 - 23 mg/dL 43(H) 37(H) 34(H)  Creatinine 0.61 - 1.24 mg/dL 3.05(H) 2.91(H) 2.94(H)  Sodium 135 - 145 mmol/L 136 135 138  Potassium 3.5 - 5.1 mmol/L 4.4 4.0 4.2  Chloride 98 - 111 mmol/L 102 103 104  CO2 22 - 32 mmol/L 21(L) 21(L) 23  Calcium 8.9 - 10.3 mg/dL 9.1 9.0 9.0  Total Protein 6.5 - 8.1 g/dL  7.5 7.4 6.9  Total Bilirubin 0.3 - 1.2 mg/dL 0.8 0.6 0.6  Alkaline Phos 38 - 126 U/L 111 124 90  AST 15 - 41 U/L '17 18 17  ' ALT 0 - 44 U/L '30 28 28      ' ASSESSMENT & PLAN:   Multiple myeloma ([facility]) 1.  IgA kappa multiple myeloma: - Diagnosis 08/15/2017, bone marrow biopsy shows 95% involvement.  3, XY.  FISH panel consistent with +14/IGH break and 13 q. minus/-13. - 4 cycles of RVD initiated on 08/30/2017 through 02/18/2018. - Bone marrow biopsy on 02/27/2018 showed hypocellular marrow with trilineage hematopoiesis, plasma cells less than 1%.  No definitive evidence of myeloma. - He was evaluated by Dr. Alvie Heidelberg at [facility] for bone marrow transplant.  Patient declined to have it done. -Revlimid maintenance 2.5 mg daily was initiated.  He is tolerating it very well.  Denies any GI side effects. - We reviewed the blood work from 03/24/19 which showed no M spike on SPEP, IFE: negative; Kappa light chains slight elevated at 122.3 compared to 100.2 in March. Free light chain ratio of 1.52 - Hemoglobin and kidney function is also stable. - Plan to see him back in 2 months with repeat myeloma panel.  He will continue Revlimid maintenance 2.5 mg daily.  - Plan to repeat skeletal Survey in October 2020  2.  Left leg DVT: - Diagnosed on 10/07/2018. -He is currently on Eliquis 2.5 mg twice daily.  Denies any bleeding issues. -Takes Lasix 40 mg Monday Wednesday Friday as  needed.  3.  Normocytic anemia: - Hemoglobin is 10.5.  Ferritin was 580.  C16 and folic acid were normal. - We will repeat at next visit.  Will consider erythropoiesis stimulating agents if it drops significantly.  4.  Diabetes: - He is on [place] 32 units daily.  He also takes glipizide 5 mg in the mornings.         Tom Marshall, [place] [phone]

## 2019-04-02 NOTE — Assessment & Plan Note (Signed)
1.  IgA kappa multiple myeloma: - Diagnosis 08/15/2017, bone marrow biopsy shows 95% involvement.  24, XY.  FISH panel consistent with +14/IGH break and 13 q. minus/-13. - 4 cycles of RVD initiated on 08/30/2017 through 02/18/2018. - Bone marrow biopsy on 02/27/2018 showed hypocellular marrow with trilineage hematopoiesis, plasma cells less than 1%.  No definitive evidence of myeloma. - He was evaluated by Dr. Alvie Heidelberg at [facility] for bone marrow transplant.  Patient declined to have it done. -Revlimid maintenance 2.5 mg daily was initiated.  He is tolerating it very well.  Denies any GI side effects. - We reviewed the blood work from 03/24/19 which showed no M spike on SPEP, IFE: negative; Kappa light chains slight elevated at 122.3 compared to 100.2 in March. Free light chain ratio of 1.52 - Hemoglobin and kidney function is also stable. - Plan to see him back in 2 months with repeat myeloma panel.  He will continue Revlimid maintenance 2.5 mg daily.  - Plan to repeat skeletal Survey in October 2020  2.  Left leg DVT: - Diagnosed on 10/07/2018. -He is currently on Eliquis 2.5 mg twice daily.  Denies any bleeding issues. -Takes Lasix 40 mg Monday Wednesday Friday as needed.  3.  Normocytic anemia: - Hemoglobin is 10.5.  Ferritin was 580.  X67 and folic acid were normal. - We will repeat at next visit.  Will consider erythropoiesis stimulating agents if it drops significantly.  4.  Diabetes: - He is on [place] 32 units daily.  He also takes glipizide 5 mg in the mornings.

## 2019-04-16 ENCOUNTER — Other Ambulatory Visit (HOSPITAL_COMMUNITY): Payer: Self-pay | Admitting: *Deleted

## 2019-04-16 DIAGNOSIS — C9000 Multiple myeloma not having achieved remission: Secondary | ICD-10-CM

## 2019-04-16 MED ORDER — LENALIDOMIDE 2.5 MG PO CAPS: 2.5000 mg | ORAL_CAPSULE | Freq: Every day | ORAL | 1 refills | Status: DC

## 2019-05-07 ENCOUNTER — Other Ambulatory Visit (HOSPITAL_COMMUNITY): Payer: Self-pay | Admitting: Hematology

## 2019-05-14 ENCOUNTER — Other Ambulatory Visit (HOSPITAL_COMMUNITY): Payer: Self-pay | Admitting: *Deleted

## 2019-05-14 DIAGNOSIS — C9000 Multiple myeloma not having achieved remission: Secondary | ICD-10-CM

## 2019-05-14 MED ORDER — LENALIDOMIDE 2.5 MG PO CAPS: 2.5000 mg | ORAL_CAPSULE | Freq: Every day | ORAL | 1 refills | Status: DC

## 2019-05-26 ENCOUNTER — Other Ambulatory Visit (HOSPITAL_COMMUNITY): Payer: Medicare Other

## 2019-05-27 ENCOUNTER — Other Ambulatory Visit: Payer: Self-pay

## 2019-05-27 ENCOUNTER — Inpatient Hospital Stay (HOSPITAL_COMMUNITY): Payer: Medicare Other | Attending: Internal Medicine

## 2019-05-27 DIAGNOSIS — C9000 Multiple myeloma not having achieved remission: Secondary | ICD-10-CM | POA: Diagnosis not present

## 2019-05-27 LAB — CBC WITH DIFFERENTIAL/PLATELET
Abs Immature Granulocytes: 0.04 10*3/uL (ref 0.00–0.07)
Basophils Absolute: 0.1 10*3/uL (ref 0.0–0.1)
Basophils Relative: 1 %
Eosinophils Absolute: 0.3 10*3/uL (ref 0.0–0.5)
Eosinophils Relative: 5 %
HCT: 32.8 % — ABNORMAL LOW (ref 39.0–52.0)
Hemoglobin: 10.7 g/dL — ABNORMAL LOW (ref 13.0–17.0)
Immature Granulocytes: 1 %
Lymphocytes Relative: 27 %
Lymphs Abs: 1.3 10*3/uL (ref 0.7–4.0)
MCH: 31.9 pg (ref 26.0–34.0)
MCHC: 32.6 g/dL (ref 30.0–36.0)
MCV: 97.9 fL (ref 80.0–100.0)
Monocytes Absolute: 0.5 10*3/uL (ref 0.1–1.0)
Monocytes Relative: 11 %
Neutro Abs: 2.8 10*3/uL (ref 1.7–7.7)
Neutrophils Relative %: 55 %
Platelets: 130 10*3/uL — ABNORMAL LOW (ref 150–400)
RBC: 3.35 MIL/uL — ABNORMAL LOW (ref 4.22–5.81)
RDW: 14.2 % (ref 11.5–15.5)
WBC: 5 10*3/uL (ref 4.0–10.5)
nRBC: 0 % (ref 0.0–0.2)

## 2019-05-27 LAB — COMPREHENSIVE METABOLIC PANEL
ALT: 25 U/L (ref 0–44)
AST: 17 U/L (ref 15–41)
Albumin: 3.8 g/dL (ref 3.5–5.0)
Alkaline Phosphatase: 102 U/L (ref 38–126)
Anion gap: 12 (ref 5–15)
BUN: 37 mg/dL — ABNORMAL HIGH (ref 8–23)
CO2: 22 mmol/L (ref 22–32)
Calcium: 8.8 mg/dL — ABNORMAL LOW (ref 8.9–10.3)
Chloride: 104 mmol/L (ref 98–111)
Creatinine, Ser: 3.12 mg/dL — ABNORMAL HIGH (ref 0.61–1.24)
GFR calc Af Amer: 22 mL/min — ABNORMAL LOW (ref >60)
GFR calc non Af Amer: 19 mL/min — ABNORMAL LOW (ref >60)
Glucose, Bld: 182 mg/dL — ABNORMAL HIGH (ref 70–99)
Potassium: 3.7 mmol/L (ref 3.5–5.1)
Sodium: 138 mmol/L (ref 135–145)
Total Bilirubin: 0.9 mg/dL (ref 0.3–1.2)
Total Protein: 6.9 g/dL (ref 6.5–8.1)

## 2019-05-28 LAB — KAPPA/LAMBDA LIGHT CHAINS
Kappa free light chain: 108 mg/L — ABNORMAL HIGH (ref 3.3–19.4)
Kappa, lambda light chain ratio: 1.37 (ref 0.26–1.65)
Lambda free light chains: 78.8 mg/L — ABNORMAL HIGH (ref 5.7–26.3)

## 2019-05-29 LAB — MULTIPLE MYELOMA PANEL, SERUM
Albumin SerPl Elph-Mcnc: 3.6 g/dL (ref 2.9–4.4)
Albumin/Glob SerPl: 1.4 (ref 0.7–1.7)
Alpha 1: 0.2 g/dL (ref 0.0–0.4)
Alpha2 Glob SerPl Elph-Mcnc: 0.7 g/dL (ref 0.4–1.0)
B-Globulin SerPl Elph-Mcnc: 1 g/dL (ref 0.7–1.3)
Gamma Glob SerPl Elph-Mcnc: 0.7 g/dL (ref 0.4–1.8)
Globulin, Total: 2.7 g/dL (ref 2.2–3.9)
IgA: 413 mg/dL (ref 61–437)
IgG (Immunoglobin G), Serum: 830 mg/dL (ref 603–1613)
IgM (Immunoglobulin M), Srm: 43 mg/dL (ref 20–172)
Total Protein ELP: 6.3 g/dL (ref 6.0–8.5)

## 2019-06-02 ENCOUNTER — Inpatient Hospital Stay (HOSPITAL_COMMUNITY): Payer: Medicare Other | Attending: Internal Medicine | Admitting: Hematology

## 2019-06-02 ENCOUNTER — Encounter (HOSPITAL_COMMUNITY): Payer: Self-pay | Admitting: Hematology

## 2019-06-02 ENCOUNTER — Other Ambulatory Visit: Payer: Self-pay

## 2019-06-02 DIAGNOSIS — Z7901 Long term (current) use of anticoagulants: Secondary | ICD-10-CM | POA: Insufficient documentation

## 2019-06-02 DIAGNOSIS — Z86718 Personal history of other venous thrombosis and embolism: Secondary | ICD-10-CM | POA: Diagnosis not present

## 2019-06-02 DIAGNOSIS — Z794 Long term (current) use of insulin: Secondary | ICD-10-CM | POA: Insufficient documentation

## 2019-06-02 DIAGNOSIS — E1165 Type 2 diabetes mellitus with hyperglycemia: Secondary | ICD-10-CM | POA: Diagnosis not present

## 2019-06-02 DIAGNOSIS — Z79899 Other long term (current) drug therapy: Secondary | ICD-10-CM | POA: Diagnosis not present

## 2019-06-02 DIAGNOSIS — C9000 Multiple myeloma not having achieved remission: Secondary | ICD-10-CM | POA: Insufficient documentation

## 2019-06-02 DIAGNOSIS — N289 Disorder of kidney and ureter, unspecified: Secondary | ICD-10-CM | POA: Insufficient documentation

## 2019-06-02 NOTE — Progress Notes (Signed)
Tom Marshall, [place] [address]   CLINIC:  Medical Oncology/Hematology  PCP:  Asencion Noble, MD [address] [place] [address] [phone]   REASON FOR VISIT:  Follow-up for Multiple Myeloma  CURRENT THERAPY: maintenance Revlimid  BRIEF ONCOLOGIC HISTORY:  Oncology History  Multiple myeloma ([place])  08/29/2017 Initial Diagnosis   Multiple myeloma ([place])   12/02/2017 -  Chemotherapy   The patient had bortezomib SQ (VELCADE) chemo injection 2.75 mg, 1.3 mg/m2 = 2.75 mg, Subcutaneous,  Once, 5 of 5 cycles Administration: 2.75 mg (12/10/2017), 2.75 mg (12/17/2017), 2.75 mg (12/31/2017), 2.75 mg (01/21/2018), 2.75 mg (02/18/2018), 2.75 mg (03/11/2018)  for chemotherapy treatment.        INTERVAL HISTORY:  Tom Marshall 68 y.o. male presents today for follow-up.  He reports overall doing well.  He denies any significant fatigue.  He is currently on maintenance Revlimid 2.5 mg daily.  Denies any bony pain.  He reports peripheral neuropathy is controlled with gabapentin.  He is here to follow-up on recent myeloma labs and office visit.   REVIEW OF SYSTEMS:  Review of Systems  Musculoskeletal: Positive for gait problem.  Neurological: Positive for extremity weakness and gait problem.  All other systems reviewed and are negative.    PAST MEDICAL/SURGICAL HISTORY:  Past Medical History:  Diagnosis Date  . Cancer ([place])    multiple myeloma  . Cancer of right kidney ([place])   . Cervical dystonia   . Diabetes mellitus without complication [facility])    Past Surgical History:  Procedure Laterality Date  . BONE MARROW BIOPSY    . CHOLECYSTECTOMY  2007  . COLONOSCOPY WITH PROPOFOL N/A 01/16/2018   Procedure: COLONOSCOPY WITH PROPOFOL;  Surgeon: Daneil Dolin, MD;  [facility];  Service: Endoscopy;  Laterality: N/A;  1:45pm  . ESOPHAGOGASTRODUODENOSCOPY (EGD) WITH PROPOFOL N/A 01/16/2018   Procedure: ESOPHAGOGASTRODUODENOSCOPY (EGD) WITH  PROPOFOL;  Surgeon: Daneil Dolin, MD;  [facility];  Service: Endoscopy;  Laterality: N/A;  . GIVENS CAPSULE STUDY N/A 05/12/2018   Procedure: GIVENS CAPSULE STUDY;  Surgeon: Daneil Dolin, MD;  [facility];  Service: Endoscopy;  Laterality: N/A;  7:30am  . NEPHRECTOMY Right 1998   cancer     SOCIAL HISTORY:  Social History   Socioeconomic History  . Marital status: Single    Spouse name: Not on file  . Number of children: Not on file  . Years of education: Not on file  . Highest education level: Not on file  Occupational History  . Occupation: Marine scientist, Medical laboratory scientific officer Needs  . Financial resource strain: Not on file  . Food insecurity    Worry: Not on file    Inability: Not on file  . Transportation needs    Medical: Not on file    Non-medical: Not on file  Tobacco Use  . Smoking status: Never Smoker  . Smokeless tobacco: Former Systems developer    Types: Chew  Substance and Sexual Activity  . Alcohol use: No  . Drug use: No  . Sexual activity: Not Currently  Lifestyle  . Physical activity    Days per week: Not on file    Minutes per session: Not on file  . Stress: Not on file  Relationships  . Social Herbalist on phone: Not on file    Gets together: Not on file    Attends religious service: Not on file  Active member of club or organization: Not on file    Attends meetings of clubs or organizations: Not on file    Relationship status: Not on file  . Intimate partner violence    Fear of current or ex partner: Not on file    Emotionally abused: Not on file    Physically abused: Not on file    Forced sexual activity: Not on file  Other Topics Concern  . Not on file  Social History Narrative  . Not on file    FAMILY HISTORY:  Family History  Problem Relation Age of Onset  . Heart failure Mother 106  . Dementia Father   . Colon cancer Neg Hx     CURRENT MEDICATIONS:  Outpatient Encounter Medications as of 06/02/2019   Medication Sig  . acyclovir (ZOVIRAX) 200 MG capsule Take 1 capsule (200 mg total) by mouth 2 (two) times daily.  [name] allopurinol (ZYLOPRIM) 300 MG tablet Take 300 mg by mouth every morning.  . B-D UF III MINI PEN NEEDLES 31G X 5 MM MISC   . cyclobenzaprine (FLEXERIL) 10 MG tablet Take 10 mg by mouth at bedtime.  [name] ELIQUIS 2.5 MG TABS tablet TAKE 1 TABLET BY MOUTH TWICE DAILY AFTER COMPLETION OF THE STARTER PACK.  . furosemide (LASIX) 40 MG tablet Take 40 mg by mouth every Monday, Wednesday, and Friday.   . gabapentin (NEURONTIN) 300 MG capsule TAKE 1 CAPSULE BY MOUTH AT BEDTIME  . glipiZIDE (GLUCOTROL XL) 5 MG 24 hr tablet Take 10 mg by mouth daily with breakfast.   . lenalidomide (REVLIMID) 2.5 MG capsule Take 1 capsule (2.5 mg total) by mouth daily.  . Multiple Vitamins-Minerals (CENTRUM SILVER 50+MEN) TABS Take 1 tablet by mouth every morning.  . pravastatin (PRAVACHOL) 20 MG tablet Take 20 mg by mouth at bedtime.   . sodium bicarbonate 650 MG tablet Take 1 tablet (650 mg total) by mouth 2 (two) times daily. (Patient taking differently: Take 650 mg by mouth 4 (four) times daily. Pt takes two  in am and two at night)  . TRESIBA FLEXTOUCH 200 UNIT/ML SOPN Inject 32 Units into the skin daily.    Facility-Administered Encounter Medications as of 06/02/2019  Medication  . 0.9 %  sodium chloride infusion    ALLERGIES:  No Known Allergies   PHYSICAL EXAM:  ECOG Performance status: 2  Vitals:   06/02/19 1200  BP: (!) 145/63  Pulse: 80  Resp: 16  Temp: (!) 97.1 F (36.2 C)  SpO2: 99%   Filed Weights   06/02/19 1200  Weight: 216 lb 1 oz (98 kg)    Physical Exam Constitutional:      Appearance: Normal appearance. He is obese.  HENT:     Head: Normocephalic.     Right Ear: External ear normal.     Left Ear: External ear normal.     Nose: Nose normal.     Mouth/Throat:     Mouth: Mucous membranes are moist.     Pharynx: Oropharynx is clear.  Eyes:     Extraocular Movements:  Extraocular movements intact.     Conjunctiva/sclera: Conjunctivae normal.  Neck:     Musculoskeletal: Normal range of motion.  Cardiovascular:     Rate and Rhythm: Normal rate and regular rhythm.     Pulses: Normal pulses.     Heart sounds: Normal heart sounds.  Pulmonary:     Effort: Pulmonary effort is normal.     Breath sounds: Normal breath sounds.  Abdominal:     General: Bowel sounds are normal.     Palpations: Abdomen is soft.  Musculoskeletal: Normal range of motion.  Skin:    General: Skin is warm and dry.  Neurological:     General: No focal deficit present.     Mental Status: He is alert and oriented to person, place, and time.  Psychiatric:        Mood and Affect: Mood normal.        Behavior: Behavior normal.        Thought Content: Thought content normal.        Judgment: Judgment normal.      LABORATORY DATA:  I have reviewed the labs as listed.  CBC    Component Value Date/Time   WBC 5.0 05/27/2019 0928   RBC 3.35 (L) 05/27/2019 0928   HGB 10.7 (L) 05/27/2019 0928   HCT 32.8 (L) 05/27/2019 0928   PLT 130 (L) 05/27/2019 0928   MCV 97.9 05/27/2019 0928   MCH 31.9 05/27/2019 0928   MCHC 32.6 05/27/2019 0928   RDW 14.2 05/27/2019 0928   LYMPHSABS 1.3 05/27/2019 0928   MONOABS 0.5 05/27/2019 0928   EOSABS 0.3 05/27/2019 0928   BASOSABS 0.1 05/27/2019 0928   CMP Latest Ref Rng & Units 05/27/2019 03/24/2019 01/13/2019  Glucose 70 - 99 mg/dL 182(H) 161(H) 159(H)  BUN 8 - 23 mg/dL 37(H) 43(H) 37(H)  Creatinine 0.61 - 1.24 mg/dL 3.12(H) 3.05(H) 2.91(H)  Sodium 135 - 145 mmol/L 138 136 135  Potassium 3.5 - 5.1 mmol/L 3.7 4.4 4.0  Chloride 98 - 111 mmol/L 104 102 103  CO2 22 - 32 mmol/L 22 21(L) 21(L)  Calcium 8.9 - 10.3 mg/dL 8.8(L) 9.1 9.0  Total Protein 6.5 - 8.1 g/dL 6.9 7.5 7.4  Total Bilirubin 0.3 - 1.2 mg/dL 0.9 0.8 0.6  Alkaline Phos 38 - 126 U/L 102 111 124  AST 15 - 41 U/L _0 ALT 0 - 44 U/L _1 ASSESSMENT & PLAN:    Multiple myeloma ([facility]) 1.  IgA kappa multiple myeloma: - Diagnosis 08/15/2017, bone marrow biopsy shows 95% involvement.  79, XY.  FISH panel consistent with +14/IGH break and 13 q. minus/-13. - 4 cycles of RVD initiated on 08/30/2017 through 02/18/2018. - Bone marrow biopsy on 02/27/2018 showed hypocellular marrow with trilineage hematopoiesis, plasma cells less than 1%.  No definitive evidence of myeloma. - He was evaluated by Dr. Alvie Heidelberg at [facility] for bone marrow transplant.  Patient declined to have it done. -Revlimid maintenance 2.5 mg daily was initiated.  He is tolerating it very well.  Denies any GI side effects. - Myeloma labs from May 27, 2019 did not reveal any M spike.  Kappa light chains 108 previously 122, lambda light chain 78.8 previously 80.3 and ratio 1.37 previously 1.52.  IgA was in the normal at 413.  IFE was negative for any monoclonal gammopathy.  Hemoglobin remained stable at 10.7.   - Recommend patient continue on maintenance Revlimid 2.5 mg.  He will return to clinic in 3 months with repeat myeloma labs.  2.  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  -He does follow-up with nephrology every 3 months.  3.  Left leg DVT: - Diagnosed on 10/07/2018. -He is currently on Eliquis 2.5 mg twice daily.  Denies any bleeding issues.   4.  Diabetes: - He is on [place] 32 units daily.  He also takes glipizide 5 mg in the mornings.       Orders placed this encounter:  Orders Placed This Encounter  Procedures  . CBC with Differential  . Comprehensive metabolic panel  . Multiple Myeloma Panel (SPEP&IFE w/QIG)  . Kappa/lambda light chains     Roger Shelter, [place] [phone]

## 2019-06-02 NOTE — Assessment & Plan Note (Signed)
1.  IgA kappa multiple myeloma: - Diagnosis 08/15/2017, bone marrow biopsy shows 95% involvement.  11, XY.  FISH panel consistent with +14/IGH break and 13 q. minus/-13. - 4 cycles of RVD initiated on 08/30/2017 through 02/18/2018. - Bone marrow biopsy on 02/27/2018 showed hypocellular marrow with trilineage hematopoiesis, plasma cells less than 1%.  No definitive evidence of myeloma. - He was evaluated by Dr. Alvie Heidelberg at [facility] for bone marrow transplant.  Patient declined to have it done. -Revlimid maintenance 2.5 mg daily was initiated.  He is tolerating it very well.  Denies any GI side effects. - Myeloma labs from May 27, 2019 did not reveal any M spike.  Kappa light chains 108 previously 122, lambda light chain 78.8 previously 80.3 and ratio 1.37 previously 1.52.  IgA was in the normal at 413.  IFE was negative for any monoclonal gammopathy.  Hemoglobin remained stable at 10.7.   - Recommend patient continue on maintenance Revlimid 2.5 mg.  He will return to clinic in 3 months with repeat myeloma labs.  2.  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  -He does follow-up with nephrology every 3 months.  3.  Left leg DVT: - Diagnosed on 10/07/2018. -He is currently on Eliquis 2.5 mg twice daily.  Denies any bleeding issues.   4.  Diabetes: - He is on [place] 32 units daily.  He also takes glipizide 5 mg in the mornings.

## 2019-06-09 ENCOUNTER — Other Ambulatory Visit (HOSPITAL_COMMUNITY): Payer: Self-pay | Admitting: Hematology

## 2019-06-11 DIAGNOSIS — Z125 Encounter for screening for malignant neoplasm of prostate: Secondary | ICD-10-CM | POA: Diagnosis not present

## 2019-06-11 DIAGNOSIS — I10 Essential (primary) hypertension: Secondary | ICD-10-CM | POA: Diagnosis not present

## 2019-06-11 DIAGNOSIS — E1129 Type 2 diabetes mellitus with other diabetic kidney complication: Secondary | ICD-10-CM | POA: Diagnosis not present

## 2019-06-11 DIAGNOSIS — N184 Chronic kidney disease, stage 4 (severe): Secondary | ICD-10-CM | POA: Diagnosis not present

## 2019-06-11 DIAGNOSIS — C9000 Multiple myeloma not having achieved remission: Secondary | ICD-10-CM | POA: Diagnosis not present

## 2019-06-11 DIAGNOSIS — E785 Hyperlipidemia, unspecified: Secondary | ICD-10-CM | POA: Diagnosis not present

## 2019-06-11 DIAGNOSIS — Z79899 Other long term (current) drug therapy: Secondary | ICD-10-CM | POA: Diagnosis not present

## 2019-06-16 ENCOUNTER — Other Ambulatory Visit (HOSPITAL_COMMUNITY): Payer: Self-pay | Admitting: *Deleted

## 2019-06-16 DIAGNOSIS — C9000 Multiple myeloma not having achieved remission: Secondary | ICD-10-CM

## 2019-06-16 MED ORDER — LENALIDOMIDE 2.5 MG PO CAPS: 2.5000 mg | ORAL_CAPSULE | Freq: Every day | ORAL | 0 refills | Status: DC

## 2019-06-16 NOTE — Telephone Encounter (Signed)
Chart reviewed, revlimid refilled. 

## 2019-06-18 DIAGNOSIS — E1122 Type 2 diabetes mellitus with diabetic chronic kidney disease: Secondary | ICD-10-CM | POA: Diagnosis not present

## 2019-06-18 DIAGNOSIS — D696 Thrombocytopenia, unspecified: Secondary | ICD-10-CM | POA: Diagnosis not present

## 2019-06-18 DIAGNOSIS — C9000 Multiple myeloma not having achieved remission: Secondary | ICD-10-CM | POA: Diagnosis not present

## 2019-06-18 DIAGNOSIS — N184 Chronic kidney disease, stage 4 (severe): Secondary | ICD-10-CM | POA: Diagnosis not present

## 2019-07-22 ENCOUNTER — Other Ambulatory Visit: Payer: Self-pay

## 2019-07-22 DIAGNOSIS — Z20822 Contact with and (suspected) exposure to covid-19: Secondary | ICD-10-CM

## 2019-07-22 DIAGNOSIS — R6889 Other general symptoms and signs: Secondary | ICD-10-CM | POA: Diagnosis not present

## 2019-07-24 LAB — NOVEL CORONAVIRUS, NAA: SARS-CoV-2, NAA: NOT DETECTED

## 2019-08-03 ENCOUNTER — Other Ambulatory Visit (HOSPITAL_COMMUNITY): Payer: Self-pay | Admitting: Hematology

## 2019-08-03 DIAGNOSIS — C9000 Multiple myeloma not having achieved remission: Secondary | ICD-10-CM

## 2019-08-05 DIAGNOSIS — D638 Anemia in other chronic diseases classified elsewhere: Secondary | ICD-10-CM | POA: Diagnosis not present

## 2019-08-05 DIAGNOSIS — N189 Chronic kidney disease, unspecified: Secondary | ICD-10-CM | POA: Diagnosis not present

## 2019-08-05 DIAGNOSIS — R809 Proteinuria, unspecified: Secondary | ICD-10-CM | POA: Diagnosis not present

## 2019-08-05 DIAGNOSIS — E1129 Type 2 diabetes mellitus with other diabetic kidney complication: Secondary | ICD-10-CM | POA: Diagnosis not present

## 2019-08-05 DIAGNOSIS — E211 Secondary hyperparathyroidism, not elsewhere classified: Secondary | ICD-10-CM | POA: Diagnosis not present

## 2019-08-05 DIAGNOSIS — C9000 Multiple myeloma not having achieved remission: Secondary | ICD-10-CM | POA: Diagnosis not present

## 2019-08-05 DIAGNOSIS — E1122 Type 2 diabetes mellitus with diabetic chronic kidney disease: Secondary | ICD-10-CM | POA: Diagnosis not present

## 2019-08-06 ENCOUNTER — Ambulatory Visit: Payer: Medicare Other | Admitting: Nurse Practitioner

## 2019-08-06 NOTE — Progress Notes (Deleted)
Referring Provider: Asencion Noble, MD Primary Care Physician:  Asencion Noble, MD Primary GI:  Dr. Gala Romney  No chief complaint on file.   HPI:   Tom Marshall is a 68 y.o. male who presents for follow-up on hemorrhoids and anemia.  The patient was last seen in our office 06/18/2018 for the same as well as hematochezia.  History of hemoglobin as low as 5.5.  Colonoscopy and EGD up-to-date 2019   Is followed at [facility] for possible need of stem cell harvest, no plans for bone marrow transplant at this time.  Kidney function 24% per nephrology.  Givens capsule was also completed 05/12/2018 and outlined as per below.  Overall findings of multifactorial anemia and likely low-volume hematochezia due to known internal hemorrhoids.  Colonoscopy completed 01/16/2018 which found diverticulosis, nonbleeding internal hemorrhoids, otherwise normal.  EGD at the same time essentially normal.  Repeat colonoscopy in 10 years (2029).   Givens capsule endoscopy completed 05/12/2018 which was a complete exam.  Markedly poor prep with debris throughout small bowel.  Any small tumor, lesion, bleeding would be difficult to see.  Possible gastric and scant small bowel erosions noted but still difficult to determine due to limited visibility.  Overall felt multifactorial anemia and likely low volume hematochezia due to known internal hemorrhoids rather than significant GI bleed.  Recommended discontinuing 81 mg daily aspirin if no cardiac history.  Recommend repeat capsule endoscopy if any further transfusion dependent anemia with a modified prep including extended, clear liquids, low-dose Reglan 30 to 45 minutes prior to capsule ingestion.  At his last visit he noted his kidney function was stable, had a stem cell harvesting in June 2019 which went well but still no decision on possible bone marrow transplant.  Hematochezia improved significantly since stopping aspirin and starting Preparation H; none in 3 weeks.  No other overt GI  complaints.  Plan to continue current medications, notify us if Preparation H becomes ineffective and we can send a prescription cream or suppository, follow-up with [facility] as recommended, follow-up in 6 months.  The patient was a no-show to his follow-up visit  Most recent CBC dated 05/27/2019 by hematology which found a hemoglobin of 10.7.  Today he states   Past Medical History:  Diagnosis Date  . Cancer ([place])    multiple myeloma  . Cancer of right kidney ([place])   . Cervical dystonia   . Diabetes mellitus without complication [facility])     Past Surgical History:  Procedure Laterality Date  . BONE MARROW BIOPSY    . CHOLECYSTECTOMY  2007  . COLONOSCOPY WITH PROPOFOL N/A 01/16/2018   Procedure: COLONOSCOPY WITH PROPOFOL;  Surgeon: Daneil Dolin, MD;  [facility];  Service: Endoscopy;  Laterality: N/A;  1:45pm  . ESOPHAGOGASTRODUODENOSCOPY (EGD) WITH PROPOFOL N/A 01/16/2018   Procedure: ESOPHAGOGASTRODUODENOSCOPY (EGD) WITH PROPOFOL;  Surgeon: Daneil Dolin, MD;  [facility];  Service: Endoscopy;  Laterality: N/A;  . GIVENS CAPSULE STUDY N/A 05/12/2018   Procedure: GIVENS CAPSULE STUDY;  Surgeon: Daneil Dolin, MD;  [facility];  Service: Endoscopy;  Laterality: N/A;  7:30am  . NEPHRECTOMY Right 1998   cancer    Current Outpatient Medications  Medication Sig Dispense Refill  . acyclovir (ZOVIRAX) 200 MG capsule Take 1 capsule (200 mg total) by mouth 2 (two) times daily. 60 capsule 2  . allopurinol (ZYLOPRIM) 300 MG tablet Take 300 mg by mouth every morning.    . B-D UF III  MINI PEN NEEDLES 31G X 5 MM MISC   4  . cyclobenzaprine (FLEXERIL) 10 MG tablet Take 10 mg by mouth at bedtime.    [name] ELIQUIS 2.5 MG TABS tablet TAKE 1 TABLET BY MOUTH TWICE DAILY AFTER COMPLETION OF THE STARTER PACK. 60 tablet 3  . furosemide (LASIX) 40 MG tablet Take 40 mg by mouth every Monday, Wednesday, and Friday.     . gabapentin (NEURONTIN) 300 MG capsule TAKE 1 CAPSULE  BY MOUTH AT BEDTIME 30 capsule 1  . glipiZIDE (GLUCOTROL XL) 5 MG 24 hr tablet Take 10 mg by mouth daily with breakfast.     . lenalidomide (REVLIMID) 2.5 MG capsule Take 1 capsule (2.5 mg total) by mouth daily. 30 capsule 0  . Multiple Vitamins-Minerals (CENTRUM SILVER 50+MEN) TABS Take 1 tablet by mouth every morning.    . pravastatin (PRAVACHOL) 20 MG tablet Take 20 mg by mouth at bedtime.     . sodium bicarbonate 650 MG tablet Take 1 tablet (650 mg total) by mouth 2 (two) times daily. (Patient taking differently: Take 650 mg by mouth 4 (four) times daily. Pt takes two  in am and two at night) 60 tablet 1  . TRESIBA FLEXTOUCH 200 UNIT/ML SOPN Inject 32 Units into the skin daily.      No current facility-administered medications for this visit.    Facility-Administered Medications Ordered in Other Visits  Medication Dose Route Frequency Provider Last Rate Last Dose  . 0.9 %  sodium chloride infusion  250 mL Intravenous Once Twana First, MD        Allergies as of 08/06/2019  . (No Known Allergies)    Family History  Problem Relation Age of Onset  . Heart failure Mother 74  . Dementia Father   . Colon cancer Neg Hx     Social History   Socioeconomic History  . Marital status: Single    Spouse name: Not on file  . Number of children: Not on file  . Years of education: Not on file  . Highest education level: Not on file  Occupational History  . Occupation: Marine scientist, Medical laboratory scientific officer Needs  . Financial resource strain: Not on file  . Food insecurity    Worry: Not on file    Inability: Not on file  . Transportation needs    Medical: Not on file    Non-medical: Not on file  Tobacco Use  . Smoking status: Never Smoker  . Smokeless tobacco: Former Systems developer    Types: Chew  Substance and Sexual Activity  . Alcohol use: No  . Drug use: No  . Sexual activity: Not Currently  Lifestyle  . Physical activity    Days per week: Not on file    Minutes per session: Not on  file  . Stress: Not on file  Relationships  . Social Herbalist on phone: Not on file    Gets together: Not on file    Attends religious service: Not on file    Active member of club or organization: Not on file    Attends meetings of clubs or organizations: Not on file    Relationship status: Not on file  Other Topics Concern  . Not on file  Social History Narrative  . Not on file    Review of Systems: General: Negative for anorexia, weight loss, fever, chills, fatigue, weakness. Eyes: Negative for vision changes.  ENT: Negative for hoarseness, difficulty swallowing , nasal congestion. CV:  Negative for chest pain, angina, palpitations, dyspnea on exertion, peripheral edema.  Respiratory: Negative for dyspnea at rest, dyspnea on exertion, cough, sputum, wheezing.  GI: See history of present illness. GU:  Negative for dysuria, hematuria, urinary incontinence, urinary frequency, nocturnal urination.  MS: Negative for joint pain, low back pain.  Derm: Negative for rash or itching.  Neuro: Negative for weakness, abnormal sensation, seizure, frequent headaches, memory loss, confusion.  Psych: Negative for anxiety, depression, suicidal ideation, hallucinations.  Endo: Negative for unusual weight change.  Heme: Negative for bruising or bleeding. Allergy: Negative for rash or hives.   Physical Exam: There were no vitals taken for this visit. General:   Alert and oriented. Pleasant and cooperative. Well-nourished and well-developed.  Head:  Normocephalic and atraumatic. Eyes:  Without icterus, sclera clear and conjunctiva pink.  Ears:  Normal auditory acuity. Mouth:  No deformity or lesions, oral mucosa pink.  Throat/Neck:  Supple, without mass or thyromegaly. Cardiovascular:  S1, S2 present without murmurs appreciated. Normal pulses noted. Extremities without clubbing or edema. Respiratory:  Clear to auscultation bilaterally. No wheezes, rales, or rhonchi. No distress.   Gastrointestinal:  +BS, soft, non-tender and non-distended. No HSM noted. No guarding or rebound. No masses appreciated.  Rectal:  Deferred  Musculoskalatal:  Symmetrical without gross deformities. Normal posture. Skin:  Intact without significant lesions or rashes. Neurologic:  Alert and oriented x4;  grossly normal neurologically. Psych:  Alert and cooperative. Normal mood and affect. Heme/Lymph/Immune: No significant cervical adenopathy. No excessive bruising noted.    08/06/2019 7:58 AM   Disclaimer: This note was dictated with voice recognition software. Similar sounding words can inadvertently be transcribed and may not be corrected upon review.

## 2019-08-13 ENCOUNTER — Other Ambulatory Visit (HOSPITAL_COMMUNITY): Payer: Self-pay | Admitting: *Deleted

## 2019-08-13 DIAGNOSIS — C9000 Multiple myeloma not having achieved remission: Secondary | ICD-10-CM

## 2019-08-13 MED ORDER — LENALIDOMIDE 2.5 MG PO CAPS: 2.5000 mg | ORAL_CAPSULE | Freq: Every day | ORAL | 0 refills | Status: DC

## 2019-08-14 ENCOUNTER — Ambulatory Visit (INDEPENDENT_AMBULATORY_CARE_PROVIDER_SITE_OTHER): Payer: Medicare Other | Admitting: Internal Medicine

## 2019-08-14 ENCOUNTER — Other Ambulatory Visit: Payer: Self-pay

## 2019-08-14 ENCOUNTER — Encounter: Payer: Self-pay | Admitting: Internal Medicine

## 2019-08-14 VITALS — BP 144/69 | HR 68 | Temp 96.9°F | Ht 66.0 in | Wt 223.6 lb

## 2019-08-14 DIAGNOSIS — K2100 Gastro-esophageal reflux disease with esophagitis, without bleeding: Secondary | ICD-10-CM

## 2019-08-14 DIAGNOSIS — K648 Other hemorrhoids: Secondary | ICD-10-CM

## 2019-08-14 NOTE — Progress Notes (Signed)
Primary Care Physician:  Asencion Noble, MD Primary Gastroenterologist:  Dr.   Pre-Procedure History & Physical: HPI:  Tom Marshall is a 68 y.o. male here for follow-up GI bleed in the setting of multiple myeloma and chronic anticoagulation therapy.  Prior GI work-up included EGD, colonoscopy and capsule study.  Felt to have rectal bleeding from hemorrhoids.  Overall doing very well clinically.  No further bleeding; hemoglobin 2 months ago 10.7.  He is getting good reports from oncology.  Currently, no GI symptoms.  He is avoiding NSAIDs. He states overall he feels that he is doing well.  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  Works part-time at [employer] . He remains anticoagulated on Eliquis.   Past Medical History:  Diagnosis Date  . Cancer ([place])    multiple myeloma  . Cancer of right kidney ([place])   . Cervical dystonia   . Diabetes mellitus without complication [facility])     Past Surgical History:  Procedure Laterality Date  . BONE MARROW BIOPSY    . CHOLECYSTECTOMY  2007  . COLONOSCOPY WITH PROPOFOL N/A 01/16/2018   Procedure: COLONOSCOPY WITH PROPOFOL;  Surgeon: Daneil Dolin, MD;  [facility];  Service: Endoscopy;  Laterality: N/A;  1:45pm  . ESOPHAGOGASTRODUODENOSCOPY (EGD) WITH PROPOFOL N/A 01/16/2018   Procedure: ESOPHAGOGASTRODUODENOSCOPY (EGD) WITH PROPOFOL;  Surgeon: Daneil Dolin, MD;  [facility];  Service: Endoscopy;  Laterality: N/A;  . GIVENS CAPSULE STUDY N/A 05/12/2018   Procedure: GIVENS CAPSULE STUDY;  Surgeon: Daneil Dolin, MD;  [facility];  Service: Endoscopy;  Laterality: N/A;  7:30am  . NEPHRECTOMY Right 1998   cancer    Prior to Admission medications   Medication Sig Start Date End Date Taking? Authorizing Provider  allopurinol (ZYLOPRIM) 300 MG tablet Take 300 mg by mouth every morning.   Yes [provider]  B-D UF III MINI PEN NEEDLES 31G X 5 MM MISC  09/02/18  Yes [provider]   cyclobenzaprine (FLEXERIL) 10 MG tablet Take 10 mg by mouth as needed.    Yes [provider]  ELIQUIS 2.5 MG TABS tablet TAKE 1 TABLET BY MOUTH TWICE DAILY AFTER COMPLETION OF THE STARTER PACK. 06/09/19  Yes Derek Jack, MD  furosemide (LASIX) 40 MG tablet Take 40 mg by mouth every Monday, Wednesday, and Friday.  10/28/17  Yes [provider]  gabapentin (NEURONTIN) 300 MG capsule TAKE 1 CAPSULE BY MOUTH AT BEDTIME 12/30/18  Yes Higgs, Mathis Dad, MD  glipiZIDE (GLUCOTROL XL) 5 MG 24 hr tablet Take 10 mg by mouth daily with breakfast.    Yes [provider]  lenalidomide (REVLIMID) 2.5 MG capsule Take 1 capsule (2.5 mg total) by mouth daily. 08/13/19  Yes Derek Jack, MD  Multiple Vitamins-Minerals (CENTRUM SILVER 50+MEN) TABS Take 1 tablet by mouth every morning.   Yes [provider]  pravastatin (PRAVACHOL) 20 MG tablet Take 20 mg by mouth at bedtime.    Yes [provider]  sodium bicarbonate 650 MG tablet Take 1 tablet (650 mg total) by mouth 2 (two) times daily. Patient taking differently: Take 650 mg by mouth 4 (four) times daily. Pt takes two  in am and two at night 07/31/17  Yes Memon, Jolaine Artist, MD  TRESIBA FLEXTOUCH 200 UNIT/ML SOPN Inject 40 Units into the skin daily.  11/12/18  Yes [provider]    Allergies as of 08/14/2019  . (No Known Allergies)  Family History  Problem Relation Age of Onset  . Heart failure Mother 76  . Dementia Father   . Colon cancer Neg Hx     Social History   Socioeconomic History  . Marital status: Single    Spouse name: Not on file  . Number of children: Not on file  . Years of education: Not on file  . Highest education level: Not on file  Occupational History  . Occupation: Marine scientist, Medical laboratory scientific officer Needs  . Financial resource strain: Not on file  . Food insecurity    Worry: Not on file    Inability: Not on file  . Transportation needs    Medical: Not on file     Non-medical: Not on file  Tobacco Use  . Smoking status: Never Smoker  . Smokeless tobacco: Former Systems developer    Types: Chew  Substance and Sexual Activity  . Alcohol use: No  . Drug use: No  . Sexual activity: Not Currently  Lifestyle  . Physical activity    Days per week: Not on file    Minutes per session: Not on file  . Stress: Not on file  Relationships  . Social Herbalist on phone: Not on file    Gets together: Not on file    Attends religious service: Not on file    Active member of club or organization: Not on file    Attends meetings of clubs or organizations: Not on file    Relationship status: Not on file  . Intimate partner violence    Fear of current or ex partner: Not on file    Emotionally abused: Not on file    Physically abused: Not on file    Forced sexual activity: Not on file  Other Topics Concern  . Not on file  Social History Narrative  . Not on file    Review of Systems: See HPI, otherwise negative ROS  Physical Exam: BP (!) 144/69   Pulse 68   Temp (!) 96.9 F (36.1 C) (Oral)   Ht _0  (1.676 m)   Wt 223 lb 9.6 oz (101.4 kg)   BMI 36.09 kg/m  General:   Alert,  pleasant and cooperative in NAD cant cervical adenopathy. Lungs:  Clear throughout to auscultation.   No wheezes, crackles, or rhonchi. No acute distress. Heart:  Regular rate and rhythm; no murmurs, clicks, rubs,  or gallops. Abdomen: Obese.  Positive bowel sounds soft nontender without appreciable mass organomegaly pulses:  Normal pulses noted.   Impression/Plan: Pleasant 67 year old gentleman with history of GI bleeding likely due to hemorrhoids.  Anemia multifactorial in etiology, inflammatory anemia/ multiple myeloma, solitary kidney/chronic kidney disease. He is asymptomatic from a GI standpoint at this time.   Recommendations:  Continue to avoid ASA/NSAIDS  Patient to watch for evidence of recurrent GI bleeding.  Keep appointments with Oncology and Dr. Willey Blade   Follow-up here as needed     Notice: This dictation was prepared with Dragon dictation along with smaller phrase technology. Any transcriptional errors that result from this process are unintentional and may not be corrected upon review.

## 2019-08-14 NOTE — Patient Instructions (Addendum)
Continue to avoid ASA/NSAIDS  Watch for bleeding in your stool  Keep appointments with Oncology and Dr. Willey Blade  Follow-up here as needed

## 2019-09-01 ENCOUNTER — Other Ambulatory Visit (HOSPITAL_COMMUNITY): Payer: Self-pay | Admitting: *Deleted

## 2019-09-01 DIAGNOSIS — C9000 Multiple myeloma not having achieved remission: Secondary | ICD-10-CM

## 2019-09-02 ENCOUNTER — Other Ambulatory Visit: Payer: Self-pay

## 2019-09-02 ENCOUNTER — Inpatient Hospital Stay (HOSPITAL_COMMUNITY): Payer: Medicare Other | Attending: Hematology

## 2019-09-02 DIAGNOSIS — N289 Disorder of kidney and ureter, unspecified: Secondary | ICD-10-CM | POA: Insufficient documentation

## 2019-09-02 DIAGNOSIS — G4700 Insomnia, unspecified: Secondary | ICD-10-CM | POA: Diagnosis not present

## 2019-09-02 DIAGNOSIS — E1165 Type 2 diabetes mellitus with hyperglycemia: Secondary | ICD-10-CM | POA: Insufficient documentation

## 2019-09-02 DIAGNOSIS — Z905 Acquired absence of kidney: Secondary | ICD-10-CM | POA: Diagnosis not present

## 2019-09-02 DIAGNOSIS — Z85528 Personal history of other malignant neoplasm of kidney: Secondary | ICD-10-CM | POA: Insufficient documentation

## 2019-09-02 DIAGNOSIS — Z9221 Personal history of antineoplastic chemotherapy: Secondary | ICD-10-CM | POA: Diagnosis not present

## 2019-09-02 DIAGNOSIS — Z86718 Personal history of other venous thrombosis and embolism: Secondary | ICD-10-CM | POA: Insufficient documentation

## 2019-09-02 DIAGNOSIS — Z79899 Other long term (current) drug therapy: Secondary | ICD-10-CM | POA: Diagnosis not present

## 2019-09-02 DIAGNOSIS — Z794 Long term (current) use of insulin: Secondary | ICD-10-CM | POA: Insufficient documentation

## 2019-09-02 DIAGNOSIS — Z7901 Long term (current) use of anticoagulants: Secondary | ICD-10-CM | POA: Insufficient documentation

## 2019-09-02 DIAGNOSIS — C9000 Multiple myeloma not having achieved remission: Secondary | ICD-10-CM | POA: Insufficient documentation

## 2019-09-02 DIAGNOSIS — Z8249 Family history of ischemic heart disease and other diseases of the circulatory system: Secondary | ICD-10-CM | POA: Diagnosis not present

## 2019-09-02 DIAGNOSIS — Z23 Encounter for immunization: Secondary | ICD-10-CM | POA: Diagnosis not present

## 2019-09-02 LAB — CBC WITH DIFFERENTIAL/PLATELET
Abs Immature Granulocytes: 0.03 10*3/uL (ref 0.00–0.07)
Basophils Absolute: 0 10*3/uL (ref 0.0–0.1)
Basophils Relative: 1 %
Eosinophils Absolute: 0.3 10*3/uL (ref 0.0–0.5)
Eosinophils Relative: 5 %
HCT: 37.6 % — ABNORMAL LOW (ref 39.0–52.0)
Hemoglobin: 12.2 g/dL — ABNORMAL LOW (ref 13.0–17.0)
Immature Granulocytes: 0 %
Lymphocytes Relative: 27 %
Lymphs Abs: 1.9 10*3/uL (ref 0.7–4.0)
MCH: 31.7 pg (ref 26.0–34.0)
MCHC: 32.4 g/dL (ref 30.0–36.0)
MCV: 97.7 fL (ref 80.0–100.0)
Monocytes Absolute: 0.9 10*3/uL (ref 0.1–1.0)
Monocytes Relative: 13 %
Neutro Abs: 3.6 10*3/uL (ref 1.7–7.7)
Neutrophils Relative %: 54 %
Platelets: 134 10*3/uL — ABNORMAL LOW (ref 150–400)
RBC: 3.85 MIL/uL — ABNORMAL LOW (ref 4.22–5.81)
RDW: 14.5 % (ref 11.5–15.5)
WBC: 6.7 10*3/uL (ref 4.0–10.5)
nRBC: 0 % (ref 0.0–0.2)

## 2019-09-02 LAB — COMPREHENSIVE METABOLIC PANEL
ALT: 27 U/L (ref 0–44)
AST: 16 U/L (ref 15–41)
Albumin: 4.2 g/dL (ref 3.5–5.0)
Alkaline Phosphatase: 121 U/L (ref 38–126)
Anion gap: 14 (ref 5–15)
BUN: 41 mg/dL — ABNORMAL HIGH (ref 8–23)
CO2: 24 mmol/L (ref 22–32)
Calcium: 9.1 mg/dL (ref 8.9–10.3)
Chloride: 98 mmol/L (ref 98–111)
Creatinine, Ser: 3.18 mg/dL — ABNORMAL HIGH (ref 0.61–1.24)
GFR calc Af Amer: 22 mL/min — ABNORMAL LOW (ref >60)
GFR calc non Af Amer: 19 mL/min — ABNORMAL LOW (ref >60)
Glucose, Bld: 203 mg/dL — ABNORMAL HIGH (ref 70–99)
Potassium: 4.2 mmol/L (ref 3.5–5.1)
Sodium: 136 mmol/L (ref 135–145)
Total Bilirubin: 1 mg/dL (ref 0.3–1.2)
Total Protein: 7.4 g/dL (ref 6.5–8.1)

## 2019-09-03 LAB — KAPPA/LAMBDA LIGHT CHAINS
Kappa free light chain: 119.5 mg/L — ABNORMAL HIGH (ref 3.3–19.4)
Kappa, lambda light chain ratio: 1.76 — ABNORMAL HIGH (ref 0.26–1.65)
Lambda free light chains: 68 mg/L — ABNORMAL HIGH (ref 5.7–26.3)

## 2019-09-04 LAB — MULTIPLE MYELOMA PANEL, SERUM
Albumin SerPl Elph-Mcnc: 4.1 g/dL (ref 2.9–4.4)
Albumin/Glob SerPl: 1.4 (ref 0.7–1.7)
Alpha 1: 0.2 g/dL (ref 0.0–0.4)
Alpha2 Glob SerPl Elph-Mcnc: 0.8 g/dL (ref 0.4–1.0)
B-Globulin SerPl Elph-Mcnc: 1.2 g/dL (ref 0.7–1.3)
Gamma Glob SerPl Elph-Mcnc: 0.8 g/dL (ref 0.4–1.8)
Globulin, Total: 3 g/dL (ref 2.2–3.9)
IgA: 410 mg/dL (ref 61–437)
IgG (Immunoglobin G), Serum: 898 mg/dL (ref 603–1613)
IgM (Immunoglobulin M), Srm: 41 mg/dL (ref 20–172)
Total Protein ELP: 7.1 g/dL (ref 6.0–8.5)

## 2019-09-09 ENCOUNTER — Other Ambulatory Visit (HOSPITAL_COMMUNITY): Payer: Self-pay | Admitting: *Deleted

## 2019-09-09 ENCOUNTER — Ambulatory Visit (HOSPITAL_COMMUNITY): Payer: Medicare Other | Admitting: Hematology

## 2019-09-09 DIAGNOSIS — C9000 Multiple myeloma not having achieved remission: Secondary | ICD-10-CM

## 2019-09-09 MED ORDER — LENALIDOMIDE 2.5 MG PO CAPS: 2.5000 mg | ORAL_CAPSULE | Freq: Every day | ORAL | 0 refills | Status: DC

## 2019-09-10 ENCOUNTER — Inpatient Hospital Stay (HOSPITAL_BASED_OUTPATIENT_CLINIC_OR_DEPARTMENT_OTHER): Payer: Medicare Other | Admitting: Hematology

## 2019-09-10 ENCOUNTER — Other Ambulatory Visit: Payer: Self-pay

## 2019-09-10 ENCOUNTER — Encounter (HOSPITAL_COMMUNITY): Payer: Self-pay | Admitting: Hematology

## 2019-09-10 DIAGNOSIS — N289 Disorder of kidney and ureter, unspecified: Secondary | ICD-10-CM | POA: Diagnosis not present

## 2019-09-10 DIAGNOSIS — C9000 Multiple myeloma not having achieved remission: Secondary | ICD-10-CM

## 2019-09-10 DIAGNOSIS — Z9221 Personal history of antineoplastic chemotherapy: Secondary | ICD-10-CM | POA: Diagnosis not present

## 2019-09-10 DIAGNOSIS — E1165 Type 2 diabetes mellitus with hyperglycemia: Secondary | ICD-10-CM | POA: Diagnosis not present

## 2019-09-10 DIAGNOSIS — Z86718 Personal history of other venous thrombosis and embolism: Secondary | ICD-10-CM | POA: Diagnosis not present

## 2019-09-10 DIAGNOSIS — Z85528 Personal history of other malignant neoplasm of kidney: Secondary | ICD-10-CM | POA: Diagnosis not present

## 2019-09-10 MED ORDER — TRAZODONE HCL 100 MG PO TABS: 100.0000 mg | ORAL_TABLET | Freq: Every day | ORAL | 3 refills | Status: DC

## 2019-09-10 NOTE — Patient Instructions (Signed)
[facility]  Discharge Instructions:  You saw Renee Nester, NP, today. _______________________________________________________________  Thank you for choosing [facility] at [facility] to provide your oncology and hematology care.  To afford each patient quality time with our providers, please arrive at least 15 minutes before your scheduled appointment.  You need to re-schedule your appointment if you arrive 10 or more minutes late.  We strive to give you quality time with our providers, and arriving late affects you and other patients whose appointments are after yours.  Also, if you no show three or more times for appointments you may be dismissed from the clinic.  Again, thank you for choosing [facility] at [facility]. Our hope is that these requests will allow you access to exceptional care and in a timely manner. _______________________________________________________________  If you have questions after your visit, please contact our office at [phone] between the hours of 8:30 a.m. and 5:00 p.m. Voicemails left after 4:30 p.m. will not be returned until the following business day. _______________________________________________________________  For prescription refill requests, have your pharmacy contact our office. _______________________________________________________________  Recommendations made by the consultant and any test results will be sent to your referring physician. _______________________________________________________________ 

## 2019-09-10 NOTE — Progress Notes (Signed)
[name], [place] [address]   CLINIC:  Medical Oncology/Hematology  PCP:  Asencion Noble, MD [address] [place] [address] [phone]   REASON FOR VISIT:  Follow-up for Multiple Myeloma   CURRENT THERAPY: maintenance Revlimid   BRIEF ONCOLOGIC HISTORY:  Oncology History  Multiple myeloma ([facility])  08/29/2017 Initial Diagnosis   Multiple myeloma ([facility])   12/02/2017 -  Chemotherapy   The patient had bortezomib SQ (VELCADE) chemo injection 2.75 mg, 1.3 mg/m2 = 2.75 mg, Subcutaneous,  Once, 5 of 5 cycles Administration: 2.75 mg (12/10/2017), 2.75 mg (12/17/2017), 2.75 mg (12/31/2017), 2.75 mg (01/21/2018), 2.75 mg (02/18/2018), 2.75 mg (03/11/2018)  for chemotherapy treatment.        INTERVAL HISTORY:  [name] 68 y.o. male presents today for follow-up.  He reports overall doing well.  He denies any significant fatigue.  He denies any new bone pains.  Denies any obvious signs of bleeding.  Denies any change in bowel habits.  Appetite is stable.  No weight loss.  He is currently on maintenance Revlimid 2.5 mg 3 weeks on 1 week off and is tolerating well.  He is here for repeat labs and office visit.  REVIEW OF SYSTEMS:  Review of Systems  Constitutional: Positive for fatigue.  HENT:  Negative.   Eyes: Negative.   Respiratory: Negative.   Cardiovascular: Negative.   Gastrointestinal: Negative.   Endocrine: Negative.   Genitourinary: Negative.    Musculoskeletal: Positive for arthralgias and myalgias.  Skin: Negative.   Neurological: Positive for extremity weakness.  Psychiatric/Behavioral: Negative.      PAST MEDICAL/SURGICAL HISTORY:  Past Medical History:  Diagnosis Date  . Cancer ([facility])    multiple myeloma  . Cancer of right kidney ([facility])   . Cervical dystonia   . Diabetes mellitus without complication [facility])    Past Surgical History:  Procedure Laterality Date  . BONE MARROW BIOPSY    . CHOLECYSTECTOMY  2007  .  COLONOSCOPY WITH PROPOFOL N/A 01/16/2018   Procedure: COLONOSCOPY WITH PROPOFOL;  Surgeon: Daneil Dolin, MD;  [facility];  Service: Endoscopy;  Laterality: N/A;  1:45pm  . ESOPHAGOGASTRODUODENOSCOPY (EGD) WITH PROPOFOL N/A 01/16/2018   Procedure: ESOPHAGOGASTRODUODENOSCOPY (EGD) WITH PROPOFOL;  Surgeon: Daneil Dolin, MD;  [facility];  Service: Endoscopy;  Laterality: N/A;  . GIVENS CAPSULE STUDY N/A 05/12/2018   Procedure: GIVENS CAPSULE STUDY;  Surgeon: Daneil Dolin, MD;  [facility];  Service: Endoscopy;  Laterality: N/A;  7:30am  . NEPHRECTOMY Right 1998   cancer     SOCIAL HISTORY:  Social History   Socioeconomic History  . Marital status: Single    Spouse name: Not on file  . Number of children: Not on file  . Years of education: Not on file  . Highest education level: Not on file  Occupational History  . Occupation: Marine scientist, Medical laboratory scientific officer Needs  . Financial resource strain: Not on file  . Food insecurity    Worry: Not on file    Inability: Not on file  . Transportation needs    Medical: Not on file    Non-medical: Not on file  Tobacco Use  . Smoking status: Never Smoker  . Smokeless tobacco: Former Systems developer    Types: Chew  Substance and Sexual Activity  . Alcohol use: No  . Drug use: No  . Sexual activity: Not Currently  Lifestyle  . Physical activity  Days per week: Not on file    Minutes per session: Not on file  . Stress: Not on file  Relationships  . Social Herbalist on phone: Not on file    Gets together: Not on file    Attends religious service: Not on file    Active member of club or organization: Not on file    Attends meetings of clubs or organizations: Not on file    Relationship status: Not on file  . Intimate partner violence    Fear of current or ex partner: Not on file    Emotionally abused: Not on file    Physically abused: Not on file    Forced sexual activity: Not on file   Other Topics Concern  . Not on file  Social History Narrative  . Not on file    FAMILY HISTORY:  Family History  Problem Relation Age of Onset  . Heart failure Mother 49  . Dementia Father   . Colon cancer Neg Hx     CURRENT MEDICATIONS:  Outpatient Encounter Medications as of 09/10/2019  Medication Sig  . diphenhydramine-acetaminophen (TYLENOL PM) 25-500 MG TABS tablet Take 1 tablet by mouth at bedtime as needed.  [name] allopurinol (ZYLOPRIM) 300 MG tablet Take 300 mg by mouth every morning.  . B-D UF III MINI PEN NEEDLES 31G X 5 MM MISC   . cyclobenzaprine (FLEXERIL) 10 MG tablet Take 10 mg by mouth as needed.   [name] ELIQUIS 2.5 MG TABS tablet TAKE 1 TABLET BY MOUTH TWICE DAILY AFTER COMPLETION OF THE STARTER PACK.  . furosemide (LASIX) 40 MG tablet Take 40 mg by mouth every Monday, Wednesday, and Friday.   . gabapentin (NEURONTIN) 300 MG capsule TAKE 1 CAPSULE BY MOUTH AT BEDTIME  . glipiZIDE (GLUCOTROL XL) 5 MG 24 hr tablet Take 5 mg by mouth daily with breakfast.   . lenalidomide (REVLIMID) 2.5 MG capsule Take 1 capsule (2.5 mg total) by mouth daily.  . Multiple Vitamins-Minerals (CENTRUM SILVER 50+MEN) TABS Take 1 tablet by mouth every morning.  . pravastatin (PRAVACHOL) 20 MG tablet Take 20 mg by mouth at bedtime.   . sodium bicarbonate 650 MG tablet Take 1 tablet (650 mg total) by mouth 2 (two) times daily. (Patient taking differently: Take 650 mg by mouth 4 (four) times daily. Pt takes two  in am and two at night)  . TRESIBA FLEXTOUCH 200 UNIT/ML SOPN Inject 40 Units into the skin daily.   . TRUE METRIX BLOOD GLUCOSE TEST test strip   . [DISCONTINUED] acyclovir (ZOVIRAX) 200 MG capsule Take by mouth.  . [DISCONTINUED] amLODipine (NORVASC) 10 MG tablet Take 10 mg by mouth daily.   . [DISCONTINUED] dexamethasone (DECADRON) 4 MG tablet Take by mouth.  . [DISCONTINUED] ergocalciferol (VITAMIN D2) 1.25 MG (50000 UT) capsule Take by mouth.  . [DISCONTINUED] Insulin Glargine, 2 Unit  Dial, (TOUJEO MAX SOLOSTAR) 300 UNIT/ML SOPN Inject into the skin.   Facility-Administered Encounter Medications as of 09/10/2019  Medication  . 0.9 %  sodium chloride infusion    ALLERGIES:  No Known Allergies   PHYSICAL EXAM:  ECOG Performance status: 1  Vitals:   09/10/19 0911  BP: 138/70  Pulse: 71  Resp: 16  Temp: (!) 97 F (36.1 C)  SpO2: 98%   Filed Weights   09/10/19 0911  Weight: 222 lb 14.4 oz (101.1 kg)    Physical Exam Constitutional:      Appearance: Normal appearance. He is obese.  HENT:     Head: Normocephalic.     Right Ear: External ear normal.     Left Ear: External ear normal.     Nose: Nose normal.  Eyes:     Conjunctiva/sclera: Conjunctivae normal.  Neck:     Musculoskeletal: Normal range of motion.  Cardiovascular:     Rate and Rhythm: Normal rate and regular rhythm.     Pulses: Normal pulses.     Heart sounds: Normal heart sounds.  Pulmonary:     Effort: Pulmonary effort is normal.     Breath sounds: Normal breath sounds.  Abdominal:     General: Bowel sounds are normal.  Musculoskeletal: Normal range of motion.  Skin:    General: Skin is warm.  Neurological:     General: No focal deficit present.     Mental Status: He is alert and oriented to person, place, and time.  Psychiatric:        Mood and Affect: Mood normal.        Behavior: Behavior normal.        Thought Content: Thought content normal.        Judgment: Judgment normal.      LABORATORY DATA:  I have reviewed the labs as listed.  CBC    Component Value Date/Time   WBC 6.7 09/02/2019 1040   RBC 3.85 (L) 09/02/2019 1040   HGB 12.2 (L) 09/02/2019 1040   HCT 37.6 (L) 09/02/2019 1040   PLT 134 (L) 09/02/2019 1040   MCV 97.7 09/02/2019 1040   MCH 31.7 09/02/2019 1040   MCHC 32.4 09/02/2019 1040   RDW 14.5 09/02/2019 1040   LYMPHSABS 1.9 09/02/2019 1040   MONOABS 0.9 09/02/2019 1040   EOSABS 0.3 09/02/2019 1040   BASOSABS 0.0 09/02/2019 1040   CMP Latest Ref  Rng & Units 09/02/2019 05/27/2019 03/24/2019  Glucose 70 - 99 mg/dL 203(H) 182(H) 161(H)  BUN 8 - 23 mg/dL 41(H) 37(H) 43(H)  Creatinine 0.61 - 1.24 mg/dL 3.18(H) 3.12(H) 3.05(H)  Sodium 135 - 145 mmol/L 136 138 136  Potassium 3.5 - 5.1 mmol/L 4.2 3.7 4.4  Chloride 98 - 111 mmol/L 98 104 102  CO2 22 - 32 mmol/L 24 22 21(L)  Calcium 8.9 - 10.3 mg/dL 9.1 8.8(L) 9.1  Total Protein 6.5 - 8.1 g/dL 7.4 6.9 7.5  Total Bilirubin 0.3 - 1.2 mg/dL 1.0 0.9 0.8  Alkaline Phos 38 - 126 U/L 121 102 111  AST 15 - 41 U/L 16 17 17  ALT 0 - 44 U/L 27 25 30          ASSESSMENT & PLAN:   Multiple myeloma ([facility]) 1.  IgA kappa multiple myeloma: - Diagnosis 08/15/2017, bone marrow biopsy shows 95% involvement.  45, XY.  FISH panel consistent with +14/IGH break and 13 q. minus/-13. - 4 cycles of RVD initiated on 08/30/2017 through 02/18/2018. - Bone marrow biopsy on 02/27/2018 showed hypocellular marrow with trilineage hematopoiesis, plasma cells less than 1%.  No definitive evidence of myeloma. - He was evaluated by Dr. Gasparetto at [facility] for bone marrow transplant.  Patient declined to have it done. -Revlimid maintenance 2.5 mg daily was initiated.  He is tolerating it very well.  Denies any GI side effects. -Serum protein electrophoresis did not reveal any evidence of monoclonal gammopathy.  Kappa free light chains elevated at 119.5, lambda 68 point, ratio 1.76.  IFE was negative.  Hemoglobin is stable at 12.2.  Serum creatinine is also stable at 3.18.    He does follow with nephrology regularly. - Recommend patient continue on maintenance Revlimid 2.5 mg 3 weeks on, 1 week off..  He will return to clinic in 3 months with repeat myeloma labs.  2.  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  -He does follow-up with nephrology every 3 months.  3.  Left leg DVT: - Diagnosed on 10/07/2018. -He is currently on Eliquis 2.5 mg twice daily.  Denies any bleeding  issues.   4.  Diabetes: - He is on Tresiba 32 units daily.  He also takes glipizide 5 mg in the mornings.  5.  Insomnia -Patient states he is taking 2 Tylenol PM's every night to sleep.  Have advised the patient to avoid drugs containing acetaminophen.  I have prescribed the patient trazodone 100 mg to take as needed at bedtime.           Kim R Nester  [facility] [phone]   

## 2019-09-10 NOTE — Assessment & Plan Note (Signed)
1.  IgA kappa multiple myeloma: - Diagnosis 08/15/2017, bone marrow biopsy shows 95% involvement.  61, XY.  FISH panel consistent with +14/IGH break and 13 q. minus/-13. - 4 cycles of RVD initiated on 08/30/2017 through 02/18/2018. - Bone marrow biopsy on 02/27/2018 showed hypocellular marrow with trilineage hematopoiesis, plasma cells less than 1%.  No definitive evidence of myeloma. - He was evaluated by Dr. Alvie Heidelberg at [facility] for bone marrow transplant.  Patient declined to have it done. -Revlimid maintenance 2.5 mg daily was initiated.  He is tolerating it very well.  Denies any GI side effects. -Serum protein electrophoresis did not reveal any evidence of monoclonal gammopathy.  Kappa free light chains elevated at 119.5, lambda 68 point, ratio 1.76.  IFE was negative.  Hemoglobin is stable at 12.2.  Serum creatinine is also stable at 3.18.  He does follow with nephrology regularly. - Recommend patient continue on maintenance Revlimid 2.5 mg 3 weeks on, 1 week off.[name]  He will return to clinic in 3 months with repeat myeloma labs.  2.  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  -He does follow-up with nephrology every 3 months.  3.  Left leg DVT: - Diagnosed on 10/07/2018. -He is currently on Eliquis 2.5 mg twice daily.  Denies any bleeding issues.   4.  Diabetes: - He is on [place] 32 units daily.  He also takes glipizide 5 mg in the mornings.  5.  Insomnia -Patient states he is taking 2 Tylenol PM's every night to sleep.  Have advised the patient to avoid drugs containing acetaminophen.  I have prescribed the patient trazodone 100 mg to take as needed at bedtime.

## 2019-09-15 DIAGNOSIS — E1129 Type 2 diabetes mellitus with other diabetic kidney complication: Secondary | ICD-10-CM | POA: Diagnosis not present

## 2019-09-17 ENCOUNTER — Other Ambulatory Visit (HOSPITAL_COMMUNITY): Payer: Self-pay | Admitting: Hematology

## 2019-09-21 DIAGNOSIS — C9000 Multiple myeloma not having achieved remission: Secondary | ICD-10-CM | POA: Diagnosis not present

## 2019-09-21 DIAGNOSIS — E1129 Type 2 diabetes mellitus with other diabetic kidney complication: Secondary | ICD-10-CM | POA: Diagnosis not present

## 2019-10-06 ENCOUNTER — Other Ambulatory Visit (HOSPITAL_COMMUNITY): Payer: Self-pay | Admitting: Hematology

## 2019-10-06 DIAGNOSIS — C9000 Multiple myeloma not having achieved remission: Secondary | ICD-10-CM

## 2019-10-07 ENCOUNTER — Other Ambulatory Visit (HOSPITAL_COMMUNITY): Payer: Self-pay | Admitting: *Deleted

## 2019-10-08 ENCOUNTER — Other Ambulatory Visit (HOSPITAL_COMMUNITY): Payer: Self-pay | Admitting: *Deleted

## 2019-10-08 DIAGNOSIS — C9000 Multiple myeloma not having achieved remission: Secondary | ICD-10-CM

## 2019-10-08 MED ORDER — LENALIDOMIDE 2.5 MG PO CAPS: ORAL_CAPSULE | ORAL | 0 refills | Status: DC

## 2019-11-06 ENCOUNTER — Other Ambulatory Visit (HOSPITAL_COMMUNITY): Payer: Self-pay | Admitting: *Deleted

## 2019-11-06 DIAGNOSIS — C9000 Multiple myeloma not having achieved remission: Secondary | ICD-10-CM

## 2019-11-06 MED ORDER — LENALIDOMIDE 2.5 MG PO CAPS: ORAL_CAPSULE | ORAL | 0 refills | Status: DC

## 2019-11-10 DIAGNOSIS — Z23 Encounter for immunization: Secondary | ICD-10-CM | POA: Diagnosis not present

## 2019-11-16 DIAGNOSIS — D638 Anemia in other chronic diseases classified elsewhere: Secondary | ICD-10-CM | POA: Diagnosis not present

## 2019-11-16 DIAGNOSIS — R809 Proteinuria, unspecified: Secondary | ICD-10-CM | POA: Diagnosis not present

## 2019-11-16 DIAGNOSIS — I129 Hypertensive chronic kidney disease with stage 1 through stage 4 chronic kidney disease, or unspecified chronic kidney disease: Secondary | ICD-10-CM | POA: Diagnosis not present

## 2019-11-16 DIAGNOSIS — E1129 Type 2 diabetes mellitus with other diabetic kidney complication: Secondary | ICD-10-CM | POA: Diagnosis not present

## 2019-11-16 DIAGNOSIS — N189 Chronic kidney disease, unspecified: Secondary | ICD-10-CM | POA: Diagnosis not present

## 2019-11-16 DIAGNOSIS — E1122 Type 2 diabetes mellitus with diabetic chronic kidney disease: Secondary | ICD-10-CM | POA: Diagnosis not present

## 2019-11-16 DIAGNOSIS — E559 Vitamin D deficiency, unspecified: Secondary | ICD-10-CM | POA: Diagnosis not present

## 2019-11-16 DIAGNOSIS — E211 Secondary hyperparathyroidism, not elsewhere classified: Secondary | ICD-10-CM | POA: Diagnosis not present

## 2019-12-02 ENCOUNTER — Other Ambulatory Visit (HOSPITAL_COMMUNITY): Payer: Self-pay | Admitting: Nurse Practitioner

## 2019-12-03 DIAGNOSIS — N189 Chronic kidney disease, unspecified: Secondary | ICD-10-CM | POA: Diagnosis not present

## 2019-12-03 DIAGNOSIS — E211 Secondary hyperparathyroidism, not elsewhere classified: Secondary | ICD-10-CM | POA: Diagnosis not present

## 2019-12-03 DIAGNOSIS — E1122 Type 2 diabetes mellitus with diabetic chronic kidney disease: Secondary | ICD-10-CM | POA: Diagnosis not present

## 2019-12-03 DIAGNOSIS — E559 Vitamin D deficiency, unspecified: Secondary | ICD-10-CM | POA: Diagnosis not present

## 2019-12-03 DIAGNOSIS — D638 Anemia in other chronic diseases classified elsewhere: Secondary | ICD-10-CM | POA: Diagnosis not present

## 2019-12-03 DIAGNOSIS — I129 Hypertensive chronic kidney disease with stage 1 through stage 4 chronic kidney disease, or unspecified chronic kidney disease: Secondary | ICD-10-CM | POA: Diagnosis not present

## 2019-12-03 DIAGNOSIS — E1129 Type 2 diabetes mellitus with other diabetic kidney complication: Secondary | ICD-10-CM | POA: Diagnosis not present

## 2019-12-03 DIAGNOSIS — R809 Proteinuria, unspecified: Secondary | ICD-10-CM | POA: Diagnosis not present

## 2019-12-04 ENCOUNTER — Other Ambulatory Visit (HOSPITAL_COMMUNITY): Payer: Self-pay

## 2019-12-04 DIAGNOSIS — D649 Anemia, unspecified: Secondary | ICD-10-CM

## 2019-12-04 DIAGNOSIS — D696 Thrombocytopenia, unspecified: Secondary | ICD-10-CM

## 2019-12-04 DIAGNOSIS — I82492 Acute embolism and thrombosis of other specified deep vein of left lower extremity: Secondary | ICD-10-CM

## 2019-12-04 DIAGNOSIS — C9000 Multiple myeloma not having achieved remission: Secondary | ICD-10-CM

## 2019-12-04 DIAGNOSIS — D7589 Other specified diseases of blood and blood-forming organs: Secondary | ICD-10-CM

## 2019-12-07 ENCOUNTER — Other Ambulatory Visit: Payer: Self-pay

## 2019-12-07 ENCOUNTER — Inpatient Hospital Stay (HOSPITAL_COMMUNITY): Payer: Medicare Other | Attending: Hematology

## 2019-12-07 DIAGNOSIS — Z86718 Personal history of other venous thrombosis and embolism: Secondary | ICD-10-CM | POA: Insufficient documentation

## 2019-12-07 DIAGNOSIS — Z7901 Long term (current) use of anticoagulants: Secondary | ICD-10-CM | POA: Insufficient documentation

## 2019-12-07 DIAGNOSIS — Z905 Acquired absence of kidney: Secondary | ICD-10-CM | POA: Diagnosis not present

## 2019-12-07 DIAGNOSIS — C9000 Multiple myeloma not having achieved remission: Secondary | ICD-10-CM | POA: Diagnosis not present

## 2019-12-07 DIAGNOSIS — G4700 Insomnia, unspecified: Secondary | ICD-10-CM | POA: Insufficient documentation

## 2019-12-07 DIAGNOSIS — Z794 Long term (current) use of insulin: Secondary | ICD-10-CM | POA: Insufficient documentation

## 2019-12-07 DIAGNOSIS — Z9221 Personal history of antineoplastic chemotherapy: Secondary | ICD-10-CM | POA: Diagnosis not present

## 2019-12-07 DIAGNOSIS — I82492 Acute embolism and thrombosis of other specified deep vein of left lower extremity: Secondary | ICD-10-CM

## 2019-12-07 DIAGNOSIS — D649 Anemia, unspecified: Secondary | ICD-10-CM

## 2019-12-07 DIAGNOSIS — D7589 Other specified diseases of blood and blood-forming organs: Secondary | ICD-10-CM

## 2019-12-07 DIAGNOSIS — N184 Chronic kidney disease, stage 4 (severe): Secondary | ICD-10-CM | POA: Diagnosis not present

## 2019-12-07 DIAGNOSIS — E119 Type 2 diabetes mellitus without complications: Secondary | ICD-10-CM | POA: Diagnosis not present

## 2019-12-07 DIAGNOSIS — D696 Thrombocytopenia, unspecified: Secondary | ICD-10-CM

## 2019-12-07 DIAGNOSIS — Z79899 Other long term (current) drug therapy: Secondary | ICD-10-CM | POA: Insufficient documentation

## 2019-12-07 DIAGNOSIS — Z85528 Personal history of other malignant neoplasm of kidney: Secondary | ICD-10-CM | POA: Insufficient documentation

## 2019-12-07 LAB — CBC WITH DIFFERENTIAL/PLATELET
Abs Immature Granulocytes: 0.04 10*3/uL (ref 0.00–0.07)
Basophils Absolute: 0.1 10*3/uL (ref 0.0–0.1)
Basophils Relative: 1 %
Eosinophils Absolute: 0.2 10*3/uL (ref 0.0–0.5)
Eosinophils Relative: 3 %
HCT: 36.7 % — ABNORMAL LOW (ref 39.0–52.0)
Hemoglobin: 11.8 g/dL — ABNORMAL LOW (ref 13.0–17.0)
Immature Granulocytes: 1 %
Lymphocytes Relative: 24 %
Lymphs Abs: 1.7 10*3/uL (ref 0.7–4.0)
MCH: 31.6 pg (ref 26.0–34.0)
MCHC: 32.2 g/dL (ref 30.0–36.0)
MCV: 98.4 fL (ref 80.0–100.0)
Monocytes Absolute: 0.6 10*3/uL (ref 0.1–1.0)
Monocytes Relative: 9 %
Neutro Abs: 4.2 10*3/uL (ref 1.7–7.7)
Neutrophils Relative %: 62 %
Platelets: 133 10*3/uL — ABNORMAL LOW (ref 150–400)
RBC: 3.73 MIL/uL — ABNORMAL LOW (ref 4.22–5.81)
RDW: 14.6 % (ref 11.5–15.5)
WBC: 6.8 10*3/uL (ref 4.0–10.5)
nRBC: 0 % (ref 0.0–0.2)

## 2019-12-07 LAB — COMPREHENSIVE METABOLIC PANEL
ALT: 30 U/L (ref 0–44)
AST: 20 U/L (ref 15–41)
Albumin: 4.1 g/dL (ref 3.5–5.0)
Alkaline Phosphatase: 113 U/L (ref 38–126)
Anion gap: 11 (ref 5–15)
BUN: 34 mg/dL — ABNORMAL HIGH (ref 8–23)
CO2: 24 mmol/L (ref 22–32)
Calcium: 9 mg/dL (ref 8.9–10.3)
Chloride: 101 mmol/L (ref 98–111)
Creatinine, Ser: 2.9 mg/dL — ABNORMAL HIGH (ref 0.61–1.24)
GFR calc Af Amer: 24 mL/min — ABNORMAL LOW (ref >60)
GFR calc non Af Amer: 21 mL/min — ABNORMAL LOW (ref >60)
Glucose, Bld: 228 mg/dL — ABNORMAL HIGH (ref 70–99)
Potassium: 4.8 mmol/L (ref 3.5–5.1)
Sodium: 136 mmol/L (ref 135–145)
Total Bilirubin: 0.9 mg/dL (ref 0.3–1.2)
Total Protein: 7.1 g/dL (ref 6.5–8.1)

## 2019-12-09 DIAGNOSIS — Z23 Encounter for immunization: Secondary | ICD-10-CM | POA: Diagnosis not present

## 2019-12-09 LAB — MULTIPLE MYELOMA PANEL, SERUM
Albumin SerPl Elph-Mcnc: 3.9 g/dL (ref 2.9–4.4)
Albumin/Glob SerPl: 1.3 (ref 0.7–1.7)
Alpha 1: 0.2 g/dL (ref 0.0–0.4)
Alpha2 Glob SerPl Elph-Mcnc: 0.8 g/dL (ref 0.4–1.0)
B-Globulin SerPl Elph-Mcnc: 1.1 g/dL (ref 0.7–1.3)
Gamma Glob SerPl Elph-Mcnc: 0.9 g/dL (ref 0.4–1.8)
Globulin, Total: 3.1 g/dL (ref 2.2–3.9)
IgA: 399 mg/dL (ref 61–437)
IgG (Immunoglobin G), Serum: 801 mg/dL (ref 603–1613)
IgM (Immunoglobulin M), Srm: 37 mg/dL (ref 20–172)
Total Protein ELP: 7 g/dL (ref 6.0–8.5)

## 2019-12-09 LAB — KAPPA/LAMBDA LIGHT CHAINS
Kappa free light chain: 285.5 mg/L — ABNORMAL HIGH (ref 3.3–19.4)
Kappa, lambda light chain ratio: 4 — ABNORMAL HIGH (ref 0.26–1.65)
Lambda free light chains: 71.4 mg/L — ABNORMAL HIGH (ref 5.7–26.3)

## 2019-12-14 ENCOUNTER — Other Ambulatory Visit: Payer: Self-pay

## 2019-12-14 ENCOUNTER — Encounter (HOSPITAL_COMMUNITY): Payer: Self-pay | Admitting: Hematology

## 2019-12-14 ENCOUNTER — Inpatient Hospital Stay (HOSPITAL_BASED_OUTPATIENT_CLINIC_OR_DEPARTMENT_OTHER): Payer: Medicare Other | Admitting: Hematology

## 2019-12-14 VITALS — BP 124/55 | HR 79 | Temp 97.1°F | Resp 16 | Wt 224.0 lb

## 2019-12-14 DIAGNOSIS — Z9221 Personal history of antineoplastic chemotherapy: Secondary | ICD-10-CM | POA: Diagnosis not present

## 2019-12-14 DIAGNOSIS — G4700 Insomnia, unspecified: Secondary | ICD-10-CM | POA: Diagnosis not present

## 2019-12-14 DIAGNOSIS — Z86718 Personal history of other venous thrombosis and embolism: Secondary | ICD-10-CM | POA: Diagnosis not present

## 2019-12-14 DIAGNOSIS — Z905 Acquired absence of kidney: Secondary | ICD-10-CM | POA: Diagnosis not present

## 2019-12-14 DIAGNOSIS — C9000 Multiple myeloma not having achieved remission: Secondary | ICD-10-CM

## 2019-12-14 DIAGNOSIS — Z85528 Personal history of other malignant neoplasm of kidney: Secondary | ICD-10-CM | POA: Diagnosis not present

## 2019-12-14 NOTE — Patient Instructions (Signed)
[facility] at [facility] Discharge Instructions  You were seen today by Dr. Katragadda. He went over your recent lab results. He will see you back in 2 months for labs and follow up.   Thank you for choosing [facility] at [facility] to provide your oncology and hematology care.  To afford each patient quality time with our provider, please arrive at least 15 minutes before your scheduled appointment time.   If you have a lab appointment with the Cancer Center please come in thru the  Main Entrance and check in at the main information desk  You need to re-schedule your appointment should you arrive 10 or more minutes late.  We strive to give you quality time with our providers, and arriving late affects you and other patients whose appointments are after yours.  Also, if you no show three or more times for appointments you may be dismissed from the clinic at the providers discretion.     Again, thank you for choosing [facility].  Our hope is that these requests will decrease the amount of time that you wait before being seen by our physicians.       _____________________________________________________________  Should you have questions after your visit to [facility], please contact our office at [phone] between the hours of 8:00 a.m. and 4:30 p.m.  Voicemails left after 4:00 p.m. will not be returned until the following business day.  For prescription refill requests, have your pharmacy contact our office and allow 72 hours.    Cancer Center Support Programs:   > Cancer Support Group  2nd Tuesday of the month 1pm-2pm, Journey Room    

## 2019-12-14 NOTE — Progress Notes (Signed)
 [facility] [address] [place], [place] [address]   CLINIC:  Medical Oncology/Hematology  PCP:  Tom Marshall, Roy, MD [address] [place] [address] [phone]   REASON FOR VISIT:  Follow-up for Multiple myeloma not having achieved remission, macrocytosis   BRIEF ONCOLOGIC HISTORY:  Oncology History  Multiple myeloma ([facility]CC)  08/29/2017 Initial Diagnosis   Multiple myeloma ([facility])   12/02/2017 -  Chemotherapy   The patient had bortezomib SQ (VELCADE) chemo injection 2.75 mg, 1.3 mg/m2 = 2.75 mg, Subcutaneous,  Once, 5 of 5 cycles Administration: 2.75 mg (12/10/2017), 2.75 mg (12/17/2017), 2.75 mg (12/31/2017), 2.75 mg (01/21/2018), 2.75 mg (02/18/2018), 2.75 mg (03/11/2018)  for chemotherapy treatment.       CANCER STAGING: Cancer Staging No matching staging information was found for the patient.   INTERVAL HISTORY:  [name] 68 y.o. male seen for follow-up of multiple myeloma.  Reports appetite 100% and energy level 75%.  Numbness and tingling in the hands and feet has been stable.  He is tolerating Revlimid 3 weeks on 1 week off reasonably well.  No GI side effects noted.  Denies any recurrent infections.  No ER visits noted.  No new onset bone pains.   REVIEW OF SYSTEMS:  Review of Systems  Neurological: Positive for numbness.  All other systems reviewed and are negative.    PAST MEDICAL/SURGICAL HISTORY:  Past Medical History:  Diagnosis Date  . Cancer ([facility])    multiple myeloma  . Cancer of right kidney ([facility])   . Cervical dystonia   . Diabetes mellitus without complication ([facility])    Past Surgical History:  Procedure Laterality Date  . BONE MARROW BIOPSY    . CHOLECYSTECTOMY  2007  . COLONOSCOPY WITH PROPOFOL N/A 01/16/2018   Procedure: COLONOSCOPY WITH PROPOFOL;  Surgeon: Rourk, Robert M, MD;  [facility];  Service: Endoscopy;  Laterality: N/A;  1:45pm  . ESOPHAGOGASTRODUODENOSCOPY (EGD) WITH PROPOFOL N/A 01/16/2018    Procedure: ESOPHAGOGASTRODUODENOSCOPY (EGD) WITH PROPOFOL;  Surgeon: Rourk, Robert M, MD;  [facility];  Service: Endoscopy;  Laterality: N/A;  . GIVENS CAPSULE STUDY N/A 05/12/2018   Procedure: GIVENS CAPSULE STUDY;  Surgeon: Rourk, Robert M, MD;  [facility];  Service: Endoscopy;  Laterality: N/A;  7:30am  . NEPHRECTOMY Right 1998   cancer     SOCIAL HISTORY:  Social History   Socioeconomic History  . Marital status: Single    Spouse name: Not on file  . Number of children: Not on file  . Years of education: Not on file  . Highest education level: Not on file  Occupational History  . Occupation: Part-time auctioneer, pilot  Tobacco Use  . Smoking status: Never Smoker  . Smokeless tobacco: Former User    Types: Chew  Substance and Sexual Activity  . Alcohol use: No  . Drug use: No  . Sexual activity: Not Currently  Other Topics Concern  . Not on file  Social History Narrative  . Not on file   Social Determinants of Health   Financial Resource Strain:   . Difficulty of Paying Living Expenses: Not on file  Food Insecurity:   . Worried About Running Out of Food in the Last Year: Not on file  . Ran Out of Food in the Last Year: Not on file  Transportation Needs:   . Lack of Transportation (Medical): Not on file  . Lack of Transportation (Non-Medical): Not on file  Physical Activity:   .   Days of Exercise per Week: Not on file  . Minutes of Exercise per Session: Not on file  Stress:   . Feeling of Stress : Not on file  Social Connections:   . Frequency of Communication with Friends and Family: Not on file  . Frequency of Social Gatherings with Friends and Family: Not on file  . Attends Religious Services: Not on file  . Active Member of Clubs or Organizations: Not on file  . Attends Archivist Meetings: Not on file  . Marital Status: Not on file  Intimate Partner Violence:   . Fear of Current or Ex-Partner: Not on file  . Emotionally  Abused: Not on file  . Physically Abused: Not on file  . Sexually Abused: Not on file    FAMILY HISTORY:  Family History  Problem Relation Age of Onset  . Heart failure Mother 55  . Dementia Father   . Colon cancer Neg Hx     CURRENT MEDICATIONS:  Outpatient Encounter Medications as of 12/14/2019  Medication Sig  . allopurinol (ZYLOPRIM) 300 MG tablet Take 300 mg by mouth every morning.  . B-D UF III MINI PEN NEEDLES 31G X 5 MM MISC   . ELIQUIS 2.5 MG TABS tablet TAKE 1 TABLET BY MOUTH TWICE DAILY AFTER COMPLETION OF THE STARTER PACK.  . furosemide (LASIX) 40 MG tablet Take 40 mg by mouth every Monday, Wednesday, and Friday.   . gabapentin (NEURONTIN) 300 MG capsule TAKE 1 CAPSULE BY MOUTH AT BEDTIME  . glipiZIDE (GLUCOTROL XL) 5 MG 24 hr tablet Take 5 mg by mouth daily with breakfast.   . lenalidomide (REVLIMID) 2.5 MG capsule Take 1 capsule (2.5 mg total) by mouth daily.  . Multiple Vitamins-Minerals (CENTRUM SILVER 50+MEN) TABS Take 1 tablet by mouth every morning.  . pravastatin (PRAVACHOL) 20 MG tablet Take 20 mg by mouth at bedtime.   . sodium bicarbonate 650 MG tablet Take 1 tablet (650 mg total) by mouth 2 (two) times daily. (Patient taking differently: Take 650 mg by mouth 4 (four) times daily. Pt takes two  in am and two at night)  . TRESIBA FLEXTOUCH 200 UNIT/ML SOPN Inject 40 Units into the skin daily.   . TRUE METRIX BLOOD GLUCOSE TEST test strip   . cyclobenzaprine (FLEXERIL) 10 MG tablet Take 10 mg by mouth as needed.   . diphenhydramine-acetaminophen (TYLENOL PM) 25-500 MG TABS tablet Take 1 tablet by mouth at bedtime as needed.  . traZODone (DESYREL) 100 MG tablet Take 1 tablet (100 mg total) by mouth at bedtime. (Patient not taking: Reported on 12/14/2019)   Facility-Administered Encounter Medications as of 12/14/2019  Medication  . 0.9 %  sodium chloride infusion    ALLERGIES:  No Known Allergies   PHYSICAL EXAM:  ECOG Performance status: 1  Vitals:    12/14/19 1147  BP: (!) 124/55  Pulse: 79  Resp: 16  Temp: (!) 97.1 F (36.2 C)  SpO2: 99%   Filed Weights   12/14/19 1147  Weight: 224 lb (101.6 kg)    Physical Exam Constitutional:      Appearance: Normal appearance.  Cardiovascular:     Rate and Rhythm: Normal rate and regular rhythm.     Heart sounds: Normal heart sounds.  Pulmonary:     Effort: Pulmonary effort is normal.     Breath sounds: Normal breath sounds.  Abdominal:     Palpations: Abdomen is soft. There is no mass.     Tenderness: There is  no abdominal tenderness.  Musculoskeletal:        General: No swelling.  Skin:    General: Skin is warm.  Neurological:     General: No focal deficit present.     Mental Status: He is alert and oriented to person, place, and time.  Psychiatric:        Mood and Affect: Mood normal.        Behavior: Behavior normal.      LABORATORY DATA:  I have reviewed the labs as listed.  CBC    Component Value Date/Time   WBC 6.8 12/07/2019 1047   RBC 3.73 (L) 12/07/2019 1047   HGB 11.8 (L) 12/07/2019 1047   HCT 36.7 (L) 12/07/2019 1047   PLT 133 (L) 12/07/2019 1047   MCV 98.4 12/07/2019 1047   MCH 31.6 12/07/2019 1047   MCHC 32.2 12/07/2019 1047   RDW 14.6 12/07/2019 1047   LYMPHSABS 1.7 12/07/2019 1047   MONOABS 0.6 12/07/2019 1047   EOSABS 0.2 12/07/2019 1047   BASOSABS 0.1 12/07/2019 1047   CMP Latest Ref Rng & Units 12/07/2019 09/02/2019 05/27/2019  Glucose 70 - 99 mg/dL 228(H) 203(H) 182(H)  BUN 8 - 23 mg/dL 34(H) 41(H) 37(H)  Creatinine 0.61 - 1.24 mg/dL 2.90(H) 3.18(H) 3.12(H)  Sodium 135 - 145 mmol/L 136 136 138  Potassium 3.5 - 5.1 mmol/L 4.8 4.2 3.7  Chloride 98 - 111 mmol/L 101 98 104  CO2 22 - 32 mmol/L _0 Calcium 8.9 - 10.3 mg/dL 9.0 9.1 8.8(L)  Total Protein 6.5 - 8.1 g/dL 7.1 7.4 6.9  Total Bilirubin 0.3 - 1.2 mg/dL 0.9 1.0 0.9  Alkaline Phos 38 - 126 U/L 113 121 102  AST 15 - 41 U/L _1 ALT 0 - 44 U/L _2 DIAGNOSTIC  IMAGING:  I have independently reviewed the scans and discussed with the patient.   I have reviewed Venita Lick LPN's note and agree with the documentation.  I personally performed a face-to-face visit, made revisions and my assessment and plan is as follows.    ASSESSMENT & PLAN:   Multiple myeloma ([place]) 1.  IgG kappa multiple myeloma: -Diagnosis 08/15/2017, bone marrow biopsy showing 95% involvement.  66, XY.  FISH panel consistent with +14, IGH break and 13 q. minus.   -4 cycles of RVD from 08/30/2017-02/18/2018. -Patient declined bone marrow transplant. -Patient currently on Revlimid 2.5 mg 3 weeks on 1 week off. -We reviewed labs from 12/07/2019 which showed no M spike.  Immunofixation is polyclonal.  Light chain ratio has gone up to 4.  Free kappa light chains increased to 285 from 119. -Calcium is 9.  Creatinine is 2.9.  Hemoglobin is 11.8 and white count is 6.8.  LFTs are normal. -I do not know this jump in kappa light chain disease from myeloma versus altered renal function. -Hence I would not make any changes at this time.  I will see him back in 2 months and repeat myeloma panel.  2.  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 -He follows up with nephrology.  Latest creatinine is 2.9.  Usual baseline is around 3.  3.  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  He is on Eliquis.  4.  Diabetes: -He is on [place] 32 units daily.  He also takes glipizide 5 mg in the mornings.  5.  Insomnia: -He will continue trazodone 100 mg at bedtime as needed.  Orders placed this encounter:  Orders Placed This Encounter  Procedures  . Protein electrophoresis, serum  . Kappa/lambda light chains  . Lactate dehydrogenase  . CBC with Differential/Platelet  . Comprehensive metabolic panel  . Immunofixation electrophoresis      Sreedhar Katragadda, MD [facility] [phone]    

## 2019-12-14 NOTE — Assessment & Plan Note (Signed)
1.  IgG kappa multiple myeloma: -Diagnosis 08/15/2017, bone marrow biopsy showing 95% involvement.  58, XY.  FISH panel consistent with +14, IGH break and 13 q. minus.   -4 cycles of RVD from 08/30/2017-02/18/2018. -Patient declined bone marrow transplant. -Patient currently on Revlimid 2.5 mg 3 weeks on 1 week off. -We reviewed labs from 12/07/2019 which showed no M spike.  Immunofixation is polyclonal.  Light chain ratio has gone up to 4.  Free kappa light chains increased to 285 from 119. -Calcium is 9.  Creatinine is 2.9.  Hemoglobin is 11.8 and white count is 6.8.  LFTs are normal. -I do not know this jump in kappa light chain disease from myeloma versus altered renal function. -Hence I would not make any changes at this time.  I will see him back in 2 months and repeat myeloma panel.  2.  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 -He follows up with nephrology.  Latest creatinine is 2.9.  Usual baseline is around 3.  3.  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  He is on Eliquis.  4.  Diabetes: -He is on [place] 32 units daily.  He also takes glipizide 5 mg in the mornings.  5.  Insomnia: -He will continue trazodone 100 mg at bedtime as needed.

## 2019-12-24 DIAGNOSIS — C9000 Multiple myeloma not having achieved remission: Secondary | ICD-10-CM | POA: Diagnosis not present

## 2019-12-24 DIAGNOSIS — E1122 Type 2 diabetes mellitus with diabetic chronic kidney disease: Secondary | ICD-10-CM | POA: Diagnosis not present

## 2019-12-24 DIAGNOSIS — N184 Chronic kidney disease, stage 4 (severe): Secondary | ICD-10-CM | POA: Diagnosis not present

## 2020-01-06 ENCOUNTER — Other Ambulatory Visit (HOSPITAL_COMMUNITY): Payer: Self-pay | Admitting: Nurse Practitioner

## 2020-01-15 ENCOUNTER — Other Ambulatory Visit (HOSPITAL_COMMUNITY): Payer: Self-pay | Admitting: *Deleted

## 2020-01-15 DIAGNOSIS — C9000 Multiple myeloma not having achieved remission: Secondary | ICD-10-CM

## 2020-01-15 MED ORDER — LENALIDOMIDE 2.5 MG PO CAPS: ORAL_CAPSULE | ORAL | 0 refills | Status: DC

## 2020-02-09 ENCOUNTER — Other Ambulatory Visit: Payer: Self-pay

## 2020-02-09 ENCOUNTER — Inpatient Hospital Stay (HOSPITAL_COMMUNITY): Payer: Medicare Other | Attending: Hematology

## 2020-02-09 DIAGNOSIS — Z79899 Other long term (current) drug therapy: Secondary | ICD-10-CM | POA: Diagnosis not present

## 2020-02-09 DIAGNOSIS — Z86718 Personal history of other venous thrombosis and embolism: Secondary | ICD-10-CM | POA: Diagnosis not present

## 2020-02-09 DIAGNOSIS — N184 Chronic kidney disease, stage 4 (severe): Secondary | ICD-10-CM | POA: Diagnosis not present

## 2020-02-09 DIAGNOSIS — Z794 Long term (current) use of insulin: Secondary | ICD-10-CM | POA: Diagnosis not present

## 2020-02-09 DIAGNOSIS — Z7901 Long term (current) use of anticoagulants: Secondary | ICD-10-CM | POA: Insufficient documentation

## 2020-02-09 DIAGNOSIS — C9000 Multiple myeloma not having achieved remission: Secondary | ICD-10-CM | POA: Diagnosis not present

## 2020-02-09 DIAGNOSIS — G4700 Insomnia, unspecified: Secondary | ICD-10-CM | POA: Insufficient documentation

## 2020-02-09 DIAGNOSIS — E1122 Type 2 diabetes mellitus with diabetic chronic kidney disease: Secondary | ICD-10-CM | POA: Diagnosis not present

## 2020-02-09 DIAGNOSIS — Z85528 Personal history of other malignant neoplasm of kidney: Secondary | ICD-10-CM | POA: Insufficient documentation

## 2020-02-09 DIAGNOSIS — Z905 Acquired absence of kidney: Secondary | ICD-10-CM | POA: Insufficient documentation

## 2020-02-09 LAB — COMPREHENSIVE METABOLIC PANEL
ALT: 23 U/L (ref 0–44)
AST: 14 U/L — ABNORMAL LOW (ref 15–41)
Albumin: 3.8 g/dL (ref 3.5–5.0)
Alkaline Phosphatase: 67 U/L (ref 38–126)
Anion gap: 11 (ref 5–15)
BUN: 51 mg/dL — ABNORMAL HIGH (ref 8–23)
CO2: 21 mmol/L — ABNORMAL LOW (ref 22–32)
Calcium: 8.9 mg/dL (ref 8.9–10.3)
Chloride: 105 mmol/L (ref 98–111)
Creatinine, Ser: 3.68 mg/dL — ABNORMAL HIGH (ref 0.61–1.24)
GFR calc Af Amer: 18 mL/min — ABNORMAL LOW (ref >60)
GFR calc non Af Amer: 16 mL/min — ABNORMAL LOW (ref >60)
Glucose, Bld: 191 mg/dL — ABNORMAL HIGH (ref 70–99)
Potassium: 5.1 mmol/L (ref 3.5–5.1)
Sodium: 137 mmol/L (ref 135–145)
Total Bilirubin: 0.7 mg/dL (ref 0.3–1.2)
Total Protein: 6.6 g/dL (ref 6.5–8.1)

## 2020-02-09 LAB — CBC WITH DIFFERENTIAL/PLATELET
Abs Immature Granulocytes: 0.03 10*3/uL (ref 0.00–0.07)
Basophils Absolute: 0 10*3/uL (ref 0.0–0.1)
Basophils Relative: 1 %
Eosinophils Absolute: 0.2 10*3/uL (ref 0.0–0.5)
Eosinophils Relative: 5 %
HCT: 29.3 % — ABNORMAL LOW (ref 39.0–52.0)
Hemoglobin: 9.3 g/dL — ABNORMAL LOW (ref 13.0–17.0)
Immature Granulocytes: 1 %
Lymphocytes Relative: 29 %
Lymphs Abs: 1.2 10*3/uL (ref 0.7–4.0)
MCH: 32.3 pg (ref 26.0–34.0)
MCHC: 31.7 g/dL (ref 30.0–36.0)
MCV: 101.7 fL — ABNORMAL HIGH (ref 80.0–100.0)
Monocytes Absolute: 0.4 10*3/uL (ref 0.1–1.0)
Monocytes Relative: 10 %
Neutro Abs: 2.2 10*3/uL (ref 1.7–7.7)
Neutrophils Relative %: 54 %
Platelets: 85 10*3/uL — ABNORMAL LOW (ref 150–400)
RBC: 2.88 MIL/uL — ABNORMAL LOW (ref 4.22–5.81)
RDW: 15.8 % — ABNORMAL HIGH (ref 11.5–15.5)
WBC: 4 10*3/uL (ref 4.0–10.5)
nRBC: 0 % (ref 0.0–0.2)

## 2020-02-09 LAB — LACTATE DEHYDROGENASE: LDH: 81 U/L — ABNORMAL LOW (ref 98–192)

## 2020-02-10 LAB — KAPPA/LAMBDA LIGHT CHAINS
Kappa free light chain: 2110.2 mg/L — ABNORMAL HIGH (ref 3.3–19.4)
Kappa, lambda light chain ratio: 57.81 — ABNORMAL HIGH (ref 0.26–1.65)
Lambda free light chains: 36.5 mg/L — ABNORMAL HIGH (ref 5.7–26.3)

## 2020-02-10 LAB — PROTEIN ELECTROPHORESIS, SERUM
A/G Ratio: 1.6 (ref 0.7–1.7)
Albumin ELP: 3.7 g/dL (ref 2.9–4.4)
Alpha-1-Globulin: 0.1 g/dL (ref 0.0–0.4)
Alpha-2-Globulin: 0.6 g/dL (ref 0.4–1.0)
Beta Globulin: 0.9 g/dL (ref 0.7–1.3)
Gamma Globulin: 0.6 g/dL (ref 0.4–1.8)
Globulin, Total: 2.3 g/dL (ref 2.2–3.9)
M-Spike, %: 0.1 g/dL — ABNORMAL HIGH
Total Protein ELP: 6 g/dL (ref 6.0–8.5)

## 2020-02-12 LAB — IMMUNOFIXATION ELECTROPHORESIS
IgA: 190 mg/dL (ref 61–437)
IgG (Immunoglobin G), Serum: 610 mg/dL (ref 603–1613)
IgM (Immunoglobulin M), Srm: 22 mg/dL (ref 20–172)
Total Protein ELP: 5.9 g/dL — ABNORMAL LOW (ref 6.0–8.5)

## 2020-02-16 ENCOUNTER — Other Ambulatory Visit: Payer: Self-pay

## 2020-02-16 ENCOUNTER — Encounter (HOSPITAL_COMMUNITY): Payer: Self-pay | Admitting: Hematology

## 2020-02-16 ENCOUNTER — Inpatient Hospital Stay (HOSPITAL_BASED_OUTPATIENT_CLINIC_OR_DEPARTMENT_OTHER): Payer: Medicare Other | Admitting: Hematology

## 2020-02-16 VITALS — BP 103/43 | HR 77 | Temp 97.3°F | Resp 19 | Wt 225.1 lb

## 2020-02-16 DIAGNOSIS — C9000 Multiple myeloma not having achieved remission: Secondary | ICD-10-CM | POA: Diagnosis not present

## 2020-02-16 DIAGNOSIS — G4700 Insomnia, unspecified: Secondary | ICD-10-CM | POA: Diagnosis not present

## 2020-02-16 DIAGNOSIS — Z86718 Personal history of other venous thrombosis and embolism: Secondary | ICD-10-CM | POA: Diagnosis not present

## 2020-02-16 DIAGNOSIS — Z7901 Long term (current) use of anticoagulants: Secondary | ICD-10-CM | POA: Diagnosis not present

## 2020-02-16 DIAGNOSIS — E1122 Type 2 diabetes mellitus with diabetic chronic kidney disease: Secondary | ICD-10-CM | POA: Diagnosis not present

## 2020-02-16 DIAGNOSIS — N184 Chronic kidney disease, stage 4 (severe): Secondary | ICD-10-CM | POA: Diagnosis not present

## 2020-02-16 NOTE — Assessment & Plan Note (Signed)
1.  IgG kappa multiple myeloma: -Diagnosis 08/15/2017, bone marrow biopsy showing 95% involvement, 71 XY.  FISH panel shows +14, IGH break and 13 q. minus. -4 cycles of RVD from 08/30/2017-02/18/2018.  He declined bone marrow transplant. -Currently on Revlimid 2.5 mg 3 weeks on 1 week off. -I reviewed labs from 02/09/2020.  M spike became positive at 0.1 g.  Serum immunofixation was positive with IgA kappa monoclonal protein.  Free kappa light chains increased to 2110.  Lambda light chains are 36.5 and ratio is 57.81.  Hemoglobin is 9.3.  Calcium was normal.  Creatinine increased to 3.68. -He also reported right posterior rib pain which started 1 week ago and is on certain movements. -I have recommended bone marrow aspiration and biopsy.  We will check myeloma FISH panel. -I have also recommended a PET CT scan.  We will arrange these as soon as possible. -We will plan to change his therapy.  He also reported diarrhea 3 weeks ago.  He was prescribed Bentyl 10 mg twice daily by Dr. Willey Blade.  Diarrhea improved.  2.  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 -He follows up with nephrology.  Creatinine today increased to 3.68.  Usual baseline is around 3.  3.  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  He is on Eliquis.  No bleeding issues reported.  4.  Diabetes: -He is on [place] 32 units daily.  He also takes glipizide 5 mg in the mornings.  5.  Insomnia: -he will continue trazodone 100 mg at bedtime.

## 2020-02-16 NOTE — Progress Notes (Signed)
[place], [place] [address]   CLINIC:  Medical Oncology/Hematology  PCP:  Asencion Noble, MD [address] [place] [id number] [phone]   REASON FOR VISIT:  Multiple myeloma.   BRIEF ONCOLOGIC HISTORY:  Oncology History  Multiple myeloma ([place])  08/29/2017 Initial Diagnosis   Multiple myeloma ([place])   12/02/2017 -  Chemotherapy   The patient had bortezomib SQ (VELCADE) chemo injection 2.75 mg, 1.3 mg/m2 = 2.75 mg, Subcutaneous,  Once, 5 of 5 cycles Administration: 2.75 mg (12/10/2017), 2.75 mg (12/17/2017), 2.75 mg (12/31/2017), 2.75 mg (01/21/2018), 2.75 mg (02/18/2018), 2.75 mg (03/11/2018)  for chemotherapy treatment.       CANCER STAGING: Cancer Staging No matching staging information was found for the patient.   INTERVAL HISTORY:  Tom Marshall 69 y.o. male seen for follow-up of multiple myeloma.  He is taking Revlimid 3 weeks on 1 week off.  Appetite is 100%.  Energy levels are 50%.  Reported new onset right posterior shoulder pain 4 out of 10.  Its not all the time.  Only movements bring it on.  He reportedly had diarrhea for 3 weeks ago.  He took Lomotil and also taking Bentyl twice daily which is helping.  Denies any other pains.  He is taking Eliquis 2.5 mg twice daily and is denying any bleeding issues.   REVIEW OF SYSTEMS:  Review of Systems  Gastrointestinal: Positive for nausea.  Neurological: Positive for numbness.  All other systems reviewed and are negative.    PAST MEDICAL/SURGICAL HISTORY:  Past Medical History:  Diagnosis Date  . Cancer ([place])    multiple myeloma  . Cancer of right kidney ([place])   . Cervical dystonia   . Diabetes mellitus without complication [facility])    Past Surgical History:  Procedure Laterality Date  . BONE MARROW BIOPSY    . CHOLECYSTECTOMY  2007  . COLONOSCOPY WITH PROPOFOL N/A 01/16/2018   Procedure: COLONOSCOPY WITH PROPOFOL;  Surgeon: Daneil Dolin, MD;  [facility];   Service: Endoscopy;  Laterality: N/A;  1:45pm  . ESOPHAGOGASTRODUODENOSCOPY (EGD) WITH PROPOFOL N/A 01/16/2018   Procedure: ESOPHAGOGASTRODUODENOSCOPY (EGD) WITH PROPOFOL;  Surgeon: Daneil Dolin, MD;  [facility];  Service: Endoscopy;  Laterality: N/A;  . GIVENS CAPSULE STUDY N/A 05/12/2018   Procedure: GIVENS CAPSULE STUDY;  Surgeon: Daneil Dolin, MD;  Location: [facility];  Service: Endoscopy;  Laterality: N/A;  7:30am  . NEPHRECTOMY Right 1998   cancer     SOCIAL HISTORY:  Social History   Socioeconomic History  . Marital status: Single    Spouse name: Not on file  . Number of children: Not on file  . Years of education: Not on file  . Highest education level: Not on file  Occupational History  . Occupation: Marine scientist, Insurance underwriter  Tobacco Use  . Smoking status: Never Smoker  . Smokeless tobacco: Former Systems developer    Types: Chew  Substance and Sexual Activity  . Alcohol use: No  . Drug use: No  . Sexual activity: Not Currently  Other Topics Concern  . Not on file  Social History Narrative  . Not on file   Social Determinants of Health   Financial Resource Strain:   . Difficulty of Paying Living Expenses:   Food Insecurity:   . Worried About Charity fundraiser in the Last Year:   . Arboriculturist in the Last Year:   Transportation Needs:   .  Lack of Transportation (Medical):   [name] Lack of Transportation (Non-Medical):   Physical Activity:   . Days of Exercise per Week:   . Minutes of Exercise per Session:   Stress:   . Feeling of Stress :   Social Connections:   . Frequency of Communication with Friends and Family:   . Frequency of Social Gatherings with Friends and Family:   . Attends Religious Services:   . Active Member of Clubs or Organizations:   . Attends Archivist Meetings:   [name] Marital Status:   Intimate Partner Violence:   . Fear of Current or Ex-Partner:   . Emotionally Abused:   [name] Physically Abused:   . Sexually  Abused:     FAMILY HISTORY:  Family History  Problem Relation Age of Onset  . Heart failure Mother 1  . Dementia Father   . Colon cancer Neg Hx     CURRENT MEDICATIONS:  Outpatient Encounter Medications as of 02/16/2020  Medication Sig  . allopurinol (ZYLOPRIM) 300 MG tablet Take 300 mg by mouth every morning.  . B-D UF III MINI PEN NEEDLES 31G X 5 MM MISC   . dicyclomine (BENTYL) 10 MG capsule Take 10 mg by mouth 2 (two) times daily as needed.  [name] ELIQUIS 2.5 MG TABS tablet TAKE 1 TABLET BY MOUTH TWICE DAILY AFTER COMPLETION OF THE STARTER PACK.  . furosemide (LASIX) 40 MG tablet Take 40 mg by mouth every Monday, Wednesday, and Friday.   . gabapentin (NEURONTIN) 300 MG capsule TAKE 1 CAPSULE BY MOUTH AT BEDTIME  . glipiZIDE (GLUCOTROL XL) 5 MG 24 hr tablet Take 5 mg by mouth daily with breakfast.   . lenalidomide (REVLIMID) 2.5 MG capsule Take 1 capsule (2.5 mg total) by mouth daily.  . Multiple Vitamins-Minerals (CENTRUM SILVER 50+MEN) TABS Take 1 tablet by mouth every morning.  . pravastatin (PRAVACHOL) 20 MG tablet Take 20 mg by mouth at bedtime.   . sodium bicarbonate 650 MG tablet Take 1 tablet (650 mg total) by mouth 2 (two) times daily. (Patient taking differently: Take 650 mg by mouth 4 (four) times daily. Pt takes two  in am and two at night)  . TRESIBA FLEXTOUCH 200 UNIT/ML SOPN Inject 40 Units into the skin daily.   . TRUE METRIX BLOOD GLUCOSE TEST test strip   . cyclobenzaprine (FLEXERIL) 10 MG tablet Take 10 mg by mouth as needed.   . diphenhydramine-acetaminophen (TYLENOL PM) 25-500 MG TABS tablet Take 1 tablet by mouth at bedtime as needed.  . diphenoxylate-atropine (LOMOTIL) 2.5-0.025 MG tablet   . traZODone (DESYREL) 100 MG tablet Take 1 tablet (100 mg total) by mouth at bedtime. (Patient not taking: Reported on 12/14/2019)   Facility-Administered Encounter Medications as of 02/16/2020  Medication  . 0.9 %  sodium chloride infusion    ALLERGIES:  No Known  Allergies   PHYSICAL EXAM:  ECOG Performance status: 1  Vitals:   02/16/20 1124  BP: (!) 103/43  Pulse: 77  Resp: 19  Temp: (!) 97.3 F (36.3 C)  SpO2: 100%   Filed Weights   02/16/20 1124  Weight: 225 lb 1.6 oz (102.1 kg)    Physical Exam Constitutional:      Appearance: Normal appearance.  Cardiovascular:     Rate and Rhythm: Normal rate and regular rhythm.     Heart sounds: Normal heart sounds.  Pulmonary:     Effort: Pulmonary effort is normal.     Breath sounds: Normal breath sounds.  Abdominal:     Palpations: Abdomen is soft. There is no mass.     Tenderness: There is no abdominal tenderness.  Musculoskeletal:        General: No swelling.  Skin:    General: Skin is warm.  Neurological:     General: No focal deficit present.     Mental Status: He is alert and oriented to person, place, and time.  Psychiatric:        Mood and Affect: Mood normal.        Behavior: Behavior normal.      LABORATORY DATA:  I have reviewed the labs as listed.  CBC    Component Value Date/Time   WBC 4.0 02/09/2020 1334   RBC 2.88 (L) 02/09/2020 1334   HGB 9.3 (L) 02/09/2020 1334   HCT 29.3 (L) 02/09/2020 1334   PLT 85 (L) 02/09/2020 1334   MCV 101.7 (H) 02/09/2020 1334   MCH 32.3 02/09/2020 1334   MCHC 31.7 02/09/2020 1334   RDW 15.8 (H) 02/09/2020 1334   LYMPHSABS 1.2 02/09/2020 1334   MONOABS 0.4 02/09/2020 1334   EOSABS 0.2 02/09/2020 1334   BASOSABS 0.0 02/09/2020 1334   CMP Latest Ref Rng & Units 02/09/2020 12/07/2019 09/02/2019  Glucose 70 - 99 mg/dL 191(H) 228(H) 203(H)  BUN 8 - 23 mg/dL 51(H) 34(H) 41(H)  Creatinine 0.61 - 1.24 mg/dL 3.68(H) 2.90(H) 3.18(H)  Sodium 135 - 145 mmol/L 137 136 136  Potassium 3.5 - 5.1 mmol/L 5.1 4.8 4.2  Chloride 98 - 111 mmol/L 105 101 98  CO2 22 - 32 mmol/L 21(L) 24 24  Calcium 8.9 - 10.3 mg/dL 8.9 9.0 9.1  Total Protein 6.5 - 8.1 g/dL 6.6 7.1 7.4  Total Bilirubin 0.3 - 1.2 mg/dL 0.7 0.9 1.0  Alkaline Phos 38 - 126 U/L 67  113 121  AST 15 - 41 U/L 14(L) 20 16  ALT 0 - 44 U/L _0 DIAGNOSTIC IMAGING:  I have reviewed scans.   I have reviewed Venita Lick LPN's note and agree with the documentation.  I personally performed a face-to-face visit, made revisions and my assessment and plan is as follows.    ASSESSMENT & PLAN:   Multiple myeloma ([place]) 1.  IgG kappa multiple myeloma: -Diagnosis 08/15/2017, bone marrow biopsy showing 95% involvement, 110 XY.  FISH panel shows +14, IGH break and 13 q. minus. -4 cycles of RVD from 08/30/2017-02/18/2018.  He declined bone marrow transplant. -Currently on Revlimid 2.5 mg 3 weeks on 1 week off. -I reviewed labs from 02/09/2020.  M spike became positive at 0.1 g.  Serum immunofixation was positive with IgA kappa monoclonal protein.  Free kappa light chains increased to 2110.  Lambda light chains are 36.5 and ratio is 57.81.  Hemoglobin is 9.3.  Calcium was normal.  Creatinine increased to 3.68. -He also reported right posterior rib pain which started 1 week ago and is on certain movements. -I have recommended bone marrow aspiration and biopsy.  We will check myeloma FISH panel. -I have also recommended a PET CT scan.  We will arrange these as soon as possible. -We will plan to change his therapy.  He also reported diarrhea 3 weeks ago.  He was prescribed Bentyl 10 mg twice daily by Dr. Willey Blade.  Diarrhea improved.  2.  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 -He follows up with nephrology.  Creatinine today increased to 3.68.  Usual baseline is  around 3.  3.  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  He is on Eliquis.  No bleeding issues reported.  4.  Diabetes: -He is on [place] 32 units daily.  He also takes glipizide 5 mg in the mornings.  5.  Insomnia: -he will continue trazodone 100 mg at bedtime.     Orders placed this encounter:  Orders Placed This Encounter  Procedures  . NM PET Image Restag (PS) Skull Base To Thigh   . CT Biopsy  . CT BONE MARROW BIOPSY & ASPIRATION      Tom Jack, MD [place] [phone]

## 2020-02-16 NOTE — Patient Instructions (Addendum)
[place] at [facility] Discharge Instructions  You were seen today by Dr. Delton Coombes. He went over your recent lab results. He will repeat your PET scan as well as a bone marrow biopsy. He will see you back after for follow up.   Thank you for choosing [place] at [facility] to provide your oncology and hematology care.  To afford each patient quality time with our provider, please arrive at least 15 minutes before your scheduled appointment time.   If you have a lab appointment with the [place] [facility] please come in thru the  Main Entrance and check in at the main information desk  You need to re-schedule your appointment should you arrive 10 or more minutes late.  We strive to give you quality time with our providers, and arriving late affects you and other patients whose appointments are after yours.  Also, if you no show three or more times for appointments you may be dismissed from the clinic at the providers discretion.     Again, thank you for choosing [facility].  Our hope is that these requests will decrease the amount of time that you wait before being seen by our physicians.       _____________________________________________________________  Should you have questions after your visit to [facility], please contact our office at [phone] between the hours of 8:00 a.m. and 4:30 p.m.  Voicemails left after 4:00 p.m. will not be returned until the following business day.  For prescription refill requests, have your pharmacy contact our office and allow 72 hours.    Cancer Center Support Programs:   > Cancer Support Group  2nd Tuesday of the month 1pm-2pm, Journey Room

## 2020-02-22 ENCOUNTER — Other Ambulatory Visit: Payer: Self-pay | Admitting: Physician Assistant

## 2020-02-23 ENCOUNTER — Ambulatory Visit (HOSPITAL_COMMUNITY)
Admission: RE | Admit: 2020-02-23 | Discharge: 2020-02-23 | Disposition: A | Discharge status: Home / Self Care | Payer: Medicare Other | Source: Ambulatory Visit | Attending: Hematology | Admitting: Hematology

## 2020-02-23 ENCOUNTER — Other Ambulatory Visit: Payer: Self-pay

## 2020-02-23 ENCOUNTER — Encounter (HOSPITAL_COMMUNITY): Payer: Self-pay

## 2020-02-23 DIAGNOSIS — Z85528 Personal history of other malignant neoplasm of kidney: Secondary | ICD-10-CM | POA: Insufficient documentation

## 2020-02-23 DIAGNOSIS — Z7901 Long term (current) use of anticoagulants: Secondary | ICD-10-CM | POA: Diagnosis not present

## 2020-02-23 DIAGNOSIS — Z79899 Other long term (current) drug therapy: Secondary | ICD-10-CM | POA: Insufficient documentation

## 2020-02-23 DIAGNOSIS — C9000 Multiple myeloma not having achieved remission: Secondary | ICD-10-CM | POA: Insufficient documentation

## 2020-02-23 DIAGNOSIS — E119 Type 2 diabetes mellitus without complications: Secondary | ICD-10-CM | POA: Insufficient documentation

## 2020-02-23 DIAGNOSIS — M899 Disorder of bone, unspecified: Secondary | ICD-10-CM | POA: Diagnosis not present

## 2020-02-23 DIAGNOSIS — Z7984 Long term (current) use of oral hypoglycemic drugs: Secondary | ICD-10-CM | POA: Insufficient documentation

## 2020-02-23 DIAGNOSIS — D696 Thrombocytopenia, unspecified: Secondary | ICD-10-CM | POA: Insufficient documentation

## 2020-02-23 DIAGNOSIS — D539 Nutritional anemia, unspecified: Secondary | ICD-10-CM | POA: Insufficient documentation

## 2020-02-23 LAB — CBC WITH DIFFERENTIAL/PLATELET
Abs Immature Granulocytes: 0.03 10*3/uL (ref 0.00–0.07)
Basophils Absolute: 0 10*3/uL (ref 0.0–0.1)
Basophils Relative: 1 %
Eosinophils Absolute: 0.3 10*3/uL (ref 0.0–0.5)
Eosinophils Relative: 7 %
HCT: 31.3 % — ABNORMAL LOW (ref 39.0–52.0)
Hemoglobin: 10.1 g/dL — ABNORMAL LOW (ref 13.0–17.0)
Immature Granulocytes: 1 %
Lymphocytes Relative: 32 %
Lymphs Abs: 1.4 10*3/uL (ref 0.7–4.0)
MCH: 33.6 pg (ref 26.0–34.0)
MCHC: 32.3 g/dL (ref 30.0–36.0)
MCV: 104 fL — ABNORMAL HIGH (ref 80.0–100.0)
Monocytes Absolute: 0.6 10*3/uL (ref 0.1–1.0)
Monocytes Relative: 13 %
Neutro Abs: 2 10*3/uL (ref 1.7–7.7)
Neutrophils Relative %: 46 %
Platelets: 67 10*3/uL — ABNORMAL LOW (ref 150–400)
RBC: 3.01 MIL/uL — ABNORMAL LOW (ref 4.22–5.81)
RDW: 16 % — ABNORMAL HIGH (ref 11.5–15.5)
WBC: 4.3 10*3/uL (ref 4.0–10.5)
nRBC: 0 % (ref 0.0–0.2)

## 2020-02-23 LAB — GLUCOSE, CAPILLARY: Glucose-Capillary: 135 mg/dL — ABNORMAL HIGH (ref 70–99)

## 2020-02-23 MED ORDER — FLUMAZENIL 0.5 MG/5ML IV SOLN
INTRAVENOUS | Status: AC
  Filled 2020-02-23: qty 5

## 2020-02-23 MED ORDER — NALOXONE HCL 0.4 MG/ML IJ SOLN
INTRAMUSCULAR | Status: AC
  Filled 2020-02-23: qty 1

## 2020-02-23 MED ORDER — MIDAZOLAM HCL 2 MG/2ML IJ SOLN
INTRAMUSCULAR | Status: AC | PRN
  Administered 2020-02-23 (×2): 1 mg via INTRAVENOUS

## 2020-02-23 MED ORDER — FENTANYL CITRATE (PF) 100 MCG/2ML IJ SOLN
INTRAMUSCULAR | Status: AC
  Filled 2020-02-23: qty 2

## 2020-02-23 MED ORDER — SODIUM CHLORIDE 0.9 % IV SOLN: INTRAVENOUS | Status: DC

## 2020-02-23 MED ORDER — MIDAZOLAM HCL 2 MG/2ML IJ SOLN
INTRAMUSCULAR | Status: AC
  Filled 2020-02-23: qty 2

## 2020-02-23 MED ORDER — FENTANYL CITRATE (PF) 100 MCG/2ML IJ SOLN
INTRAMUSCULAR | Status: AC | PRN
  Administered 2020-02-23 (×2): 50 ug via INTRAVENOUS

## 2020-02-23 MED ORDER — LIDOCAINE HCL (PF) 1 % IJ SOLN
INTRAMUSCULAR | Status: AC | PRN
  Administered 2020-02-23: 10 mL via INTRADERMAL

## 2020-02-23 NOTE — H&P (Signed)
Chief Complaint: Patient was seen in consultation today for bone marrow biopsy.  Referring Physician(s): Firefighter  Supervising Physician: Markus Daft  Patient Status: [facility] - Out-pt  History of Present Illness: Tom Marshall is a 69 y.o. male with a past medical history significant for DM, GI bleed, LLE DVT currently on Eliquis, CKD, renal cancer s/p right nephrectomy (1998) and multiple myeloma followed by Dr. Delton Coombes who presents today for a bone marrow biopsy. Tom Marshall was originally diagnosed with multiple myeloma in November of 2018 and has been undergoing chemotherapy since early 2019. He recently underwent routine lab monitoring and was noted to have a positive M spike and IgA kappa monoclonal protein with an increase in free kappa light chains. IR has been asked to perform bone marrow biopsy to further evaluate these lab findings and to provide tissue for myeloma FISH panel.  Tom Marshall states that he has been feeling well overall, his appetite as been good but he has noticed an increase in diarrhea which he saw his GI provider about and was told it was due to his myeloma. He has been taking pepto-bismol which has been working well. He has not had any other GI symptoms. He is surprised that his myeloma was in remission for so long and now it is suddenly back. He understands the procedure today and wishes to proceed as planned.   Past Medical History:  Diagnosis Date  . Cancer ([place])    multiple myeloma  . Cancer of right kidney ([place])   . Cervical dystonia   . Diabetes mellitus without complication [facility])     Past Surgical History:  Procedure Laterality Date  . BONE MARROW BIOPSY    . CHOLECYSTECTOMY  2007  . COLONOSCOPY WITH PROPOFOL N/A 01/16/2018   Procedure: COLONOSCOPY WITH PROPOFOL;  Surgeon: Daneil Dolin, MD;  [facility];  Service: Endoscopy;  Laterality: N/A;  1:45pm  . ESOPHAGOGASTRODUODENOSCOPY (EGD) WITH PROPOFOL N/A 01/16/2018   Procedure: ESOPHAGOGASTRODUODENOSCOPY (EGD) WITH PROPOFOL;  Surgeon: Daneil Dolin, MD;  [facility];  Service: Endoscopy;  Laterality: N/A;  . GIVENS CAPSULE STUDY N/A 05/12/2018   Procedure: GIVENS CAPSULE STUDY;  Surgeon: Daneil Dolin, MD;  [facility];  Service: Endoscopy;  Laterality: N/A;  7:30am  . NEPHRECTOMY Right 1998   cancer    Allergies: Patient has no known allergies.  Medications: Prior to Admission medications   Medication Sig Start Date End Date Taking? Authorizing Provider  allopurinol (ZYLOPRIM) 300 MG tablet Take 300 mg by mouth every morning.    [provider]  B-D UF III MINI PEN NEEDLES 31G X 5 MM MISC  09/02/18   [provider]  cyclobenzaprine (FLEXERIL) 10 MG tablet Take 10 mg by mouth as needed.     [provider]  dicyclomine (BENTYL) 10 MG capsule Take 10 mg by mouth 2 (two) times daily as needed. 02/01/20   [provider]  diphenhydramine-acetaminophen (TYLENOL PM) 25-500 MG TABS tablet Take 1 tablet by mouth at bedtime as needed.    [provider]  diphenoxylate-atropine (LOMOTIL) 2.5-0.025 MG tablet  01/26/20   [provider]  ELIQUIS 2.5 MG TABS tablet TAKE 1 TABLET BY MOUTH TWICE DAILY AFTER COMPLETION OF THE STARTER PACK. 01/07/20   Lockamy, Randi L, NP-C  furosemide (LASIX) 40 MG tablet Take 40 mg by mouth every Monday, Wednesday, and Friday.  10/28/17   [provider]  gabapentin (NEURONTIN) 300 MG capsule TAKE 1  CAPSULE BY MOUTH AT BEDTIME 12/30/18   Higgs, Mathis Dad, MD  glipiZIDE (GLUCOTROL XL) 5 MG 24 hr tablet Take 5 mg by mouth daily with breakfast.     [provider]  lenalidomide (REVLIMID) 2.5 MG capsule Take 1 capsule (2.5 mg total) by mouth daily. 01/15/20   Derek Jack, MD  Multiple Vitamins-Minerals (CENTRUM SILVER 50+MEN) TABS Take 1 tablet by mouth every morning.    [provider]  pravastatin (PRAVACHOL) 20 MG tablet Take 20  mg by mouth at bedtime.     [provider]  sodium bicarbonate 650 MG tablet Take 1 tablet (650 mg total) by mouth 2 (two) times daily. Patient taking differently: Take 650 mg by mouth 4 (four) times daily. Pt takes two  in am and two at night 07/31/17   Kathie Dike, MD  traZODone (DESYREL) 100 MG tablet Take 1 tablet (100 mg total) by mouth at bedtime. Patient not taking: Reported on 12/14/2019 09/10/19   Roger Shelter, FNP  TRESIBA FLEXTOUCH 200 UNIT/ML SOPN Inject 40 Units into the skin daily.  11/12/18   [provider]  TRUE METRIX BLOOD GLUCOSE TEST test strip  07/01/19   [provider]     Family History  Problem Relation Age of Onset  . Heart failure Mother 12  . Dementia Father   . Colon cancer Neg Hx     Social History   Socioeconomic History  . Marital status: Single    Spouse name: Not on file  . Number of children: Not on file  . Years of education: Not on file  . Highest education level: Not on file  Occupational History  . Occupation: Marine scientist, Insurance underwriter  Tobacco Use  . Smoking status: Never Smoker  . Smokeless tobacco: Former Systems developer    Types: Chew  Substance and Sexual Activity  . Alcohol use: No  . Drug use: No  . Sexual activity: Not Currently  Other Topics Concern  . Not on file  Social History Narrative  . Not on file   Social Determinants of Health   Financial Resource Strain:   . Difficulty of Paying Living Expenses:   Food Insecurity:   . Worried About Charity fundraiser in the Last Year:   . Arboriculturist in the Last Year:   Transportation Needs:   . Film/video editor (Medical):   [name] Lack of Transportation (Non-Medical):   Physical Activity:   . Days of Exercise per Week:   . Minutes of Exercise per Session:   Stress:   . Feeling of Stress :   Social Connections:   . Frequency of Communication with Friends and Family:   . Frequency of Social Gatherings with Friends and Family:   . Attends  Religious Services:   . Active Member of Clubs or Organizations:   . Attends Archivist Meetings:   [name] Marital Status:      Review of Systems: A 12 point ROS discussed and pertinent positives are indicated in the HPI above.  All other systems are negative.  Review of Systems  Constitutional: Negative for chills and fever.  Respiratory: Negative for cough and shortness of breath.   Cardiovascular: Negative for chest pain.  Gastrointestinal: Positive for diarrhea. Negative for abdominal pain, blood in stool, nausea and vomiting.  Genitourinary: Negative for hematuria.  Musculoskeletal: Negative for back pain.  Skin: Negative for wound.  Neurological: Negative for dizziness and headaches.    Vital Signs: BP 134/66 (  BP Location: Right Arm)   Pulse 72   Temp 97.7 F (36.5 C) (Oral)   Resp 18   Ht _0  (1.727 m)   Wt 218 lb (98.9 kg)   SpO2 100%   BMI 33.15 kg/m   Physical Exam Vitals reviewed.  Constitutional:      General: He is not in acute distress. HENT:     Head: Normocephalic.     Mouth/Throat:     Mouth: Mucous membranes are moist.     Pharynx: Oropharynx is clear. No oropharyngeal exudate or posterior oropharyngeal erythema.  Cardiovascular:     Rate and Rhythm: Normal rate and regular rhythm.  Pulmonary:     Effort: Pulmonary effort is normal.     Breath sounds: Normal breath sounds.  Abdominal:     General: There is no distension.     Palpations: Abdomen is soft.     Tenderness: There is no abdominal tenderness.  Skin:    General: Skin is warm and dry.  Neurological:     Mental Status: He is alert and oriented to person, place, and time.  Psychiatric:        Mood and Affect: Mood normal.        Behavior: Behavior normal.        Thought Content: Thought content normal.        Judgment: Judgment normal.      MD Evaluation Airway: WNL Heart: WNL Abdomen: WNL Chest/ Lungs: WNL ASA  Classification: 2 Mallampati/Airway Score:  Two   Imaging: No results found.  Labs:  CBC: Recent Labs    05/27/19 0928 09/02/19 1040 12/07/19 1047 02/09/20 1334  WBC 5.0 6.7 6.8 4.0  HGB 10.7* 12.2* 11.8* 9.3*  HCT 32.8* 37.6* 36.7* 29.3*  PLT 130* 134* 133* 85*    COAGS: No results for input(s): INR, APTT in the last 8760 hours.  BMP: Recent Labs    05/27/19 0928 09/02/19 1040 12/07/19 1047 02/09/20 1334  NA 138 136 136 137  K 3.7 4.2 4.8 5.1  CL 104 98 101 105  CO2 _1 21*  GLUCOSE 182* 203* 228* 191*  BUN 37* 41* 34* 51*  CALCIUM 8.8* 9.1 9.0 8.9  CREATININE 3.12* 3.18* 2.90* 3.68*  GFRNONAA 19* 19* 21* 16*  GFRAA 22* 22* 24* 18*    LIVER FUNCTION TESTS: Recent Labs    05/27/19 0928 09/02/19 1040 12/07/19 1047 02/09/20 1334  BILITOT 0.9 1.0 0.9 0.7  AST _2 14*  ALT _3 ALKPHOS 102 121 113 67  PROT 6.9 7.4 7.1 6.6  ALBUMIN 3.8 4.2 4.1 3.8    TUMOR MARKERS: No results for input(s): AFPTM, CEA, CA199, CHROMGRNA in the last 8760 hours.  Assessment and Plan:  69 y/o M with multiple myeloma currently on chemotherapy followed by Dr. Delton Coombes who presents today for a bone marrow biopsy to further evaluate recent lab changes as well as to provide tissue for myeloma FISH panel.  Patient has been NPO since midnight, last dose of Eliquis 4/25. Afebrile, CBC with diff pending.  Risks and benefits of bone marrow aspiration/biopsy was discussed with the patient and/or patient's family including, but not limited to bleeding, infection, damage to adjacent structures or low yield requiring additional tests.  All of the questions were answered and there is agreement to proceed.  Consent signed and in chart.  Thank you for this interesting consult.  I greatly enjoyed meeting Tom Marshall and look forward  to participating in their care.  A copy of this report was sent to the requesting provider on this date.  Electronically Signed: Joaquim Nam, PA-C 02/23/2020, 8:46  AM   I spent a total of 30 Minutes in face to face in clinical consultation, greater than 50% of which was counseling/coordinating care for bone marrow biopsy.

## 2020-02-23 NOTE — Procedures (Signed)
Interventional Radiology Procedure:   Indications: Multiple myeloma  Procedure: CT guided bone marrow biopsy  Findings: 2 aspirates and 1 core from right ilium  Complications: None     EBL: Minimal, less than 10 ml  Plan: Discharge to home in one hour.   Carmino Ocain R. [place] Ragsdale, MD  Pager: [phone]   

## 2020-02-23 NOTE — Discharge Instructions (Signed)
Please call Interventional Radiology clinic [phone] with any questions or concerns.  You may remove your dressing and shower tomorrow.   Moderate Conscious Sedation, Adult, Care After These instructions provide you with information about caring for yourself after your procedure. Your health care provider may also give you more specific instructions. Your treatment has been planned according to current medical practices, but problems sometimes occur. Call your health care provider if you have any problems or questions after your procedure. What can I expect after the procedure? After your procedure, it is common:  To feel sleepy for several hours.  To feel clumsy and have poor balance for several hours.  To have poor judgment for several hours.  To vomit if you eat too soon. Follow these instructions at home: For at least 24 hours after the procedure:   Do not: ? Participate in activities where you could fall or become injured. ? Drive. ? Use heavy machinery. ? Drink alcohol. ? Take sleeping pills or medicines that cause drowsiness. ? Make important decisions or sign legal documents. ? Take care of children on your own.  Rest. Eating and drinking  Follow the diet recommended by your health care provider.  If you vomit: ? Drink water, juice, or soup when you can drink without vomiting. ? Make sure you have little or no nausea before eating solid foods. General instructions  Have a responsible adult stay with you until you are awake and alert.  Take over-the-counter and prescription medicines only as told by your health care provider.  If you smoke, do not smoke without supervision.  Keep all follow-up visits as told by your health care provider. This is important. Contact a health care provider if:  You keep feeling nauseous or you keep vomiting.  You feel light-headed.  You develop a rash.  You have a fever. Get help right away if:  You have trouble  breathing. This information is not intended to replace advice given to you by your health care provider. Make sure you discuss any questions you have with your health care provider. Document Revised: 09/27/2017 Document Reviewed: 02/04/2016 Elsevier Patient Education  2020 [place].   Bone Marrow Aspiration and Bone Marrow Biopsy, Adult, Care After This sheet gives you information about how to care for yourself after your procedure. Your health care provider may also give you more specific instructions. If you have problems or questions, contact your health care provider. What can I expect after the procedure? After the procedure, it is common to have:  Mild pain and tenderness.  Swelling.  Bruising. Follow these instructions at home: Puncture site care   Follow instructions from your health care provider about how to take care of the puncture site. Make sure you: ? Wash your hands with soap and water before and after you change your bandage (dressing). If soap and water are not available, use hand sanitizer. ? Change your dressing as told by your health care provider.  Check your puncture site every day for signs of infection. Check for: ? More redness, swelling, or pain. ? Fluid or blood. ? Warmth. ? Pus or a bad smell. Activity  Return to your normal activities as told by your health care provider. Ask your health care provider what activities are safe for you.  Do not lift anything that is heavier than 10 lb (4.5 kg), or the limit that you are told, until your health care provider says that it is safe.  Do not drive for 24  hours if you were given a sedative during your procedure. General instructions   Take over-the-counter and prescription medicines only as told by your health care provider.  Do not take baths, swim, or use a hot tub until your health care provider approves. Ask your health care provider if you may take showers. You may only be allowed to take  sponge baths.  If directed, put ice on the affected area. To do this: ? Put ice in a plastic bag. ? Place a towel between your skin and the bag. ? Leave the ice on for 20 minutes, 2-3 times a day.  Keep all follow-up visits as told by your health care provider. This is important. Contact a health care provider if:  Your pain is not controlled with medicine.  You have a fever.  You have more redness, swelling, or pain around the puncture site.  You have fluid or blood coming from the puncture site.  Your puncture site feels warm to the touch.  You have pus or a bad smell coming from the puncture site. Summary  After the procedure, it is common to have mild pain, tenderness, swelling, and bruising.  Follow instructions from your health care provider about how to take care of the puncture site and what activities are safe for you.  Take over-the-counter and prescription medicines only as told by your health care provider.  Contact a health care provider if you have any signs of infection, such as fluid or blood coming from the puncture site. This information is not intended to replace advice given to you by your health care provider. Make sure you discuss any questions you have with your health care provider. Document Revised: 03/03/2019 Document Reviewed: 03/03/2019 Elsevier Patient Education  [place].

## 2020-02-25 ENCOUNTER — Other Ambulatory Visit (HOSPITAL_COMMUNITY): Payer: Self-pay | Admitting: *Deleted

## 2020-02-25 DIAGNOSIS — C9000 Multiple myeloma not having achieved remission: Secondary | ICD-10-CM | POA: Diagnosis not present

## 2020-02-25 DIAGNOSIS — Z6839 Body mass index (BMI) 39.0-39.9, adult: Secondary | ICD-10-CM | POA: Diagnosis not present

## 2020-02-25 DIAGNOSIS — N184 Chronic kidney disease, stage 4 (severe): Secondary | ICD-10-CM | POA: Diagnosis not present

## 2020-02-25 DIAGNOSIS — E1122 Type 2 diabetes mellitus with diabetic chronic kidney disease: Secondary | ICD-10-CM | POA: Diagnosis not present

## 2020-02-25 LAB — SURGICAL PATHOLOGY

## 2020-02-25 MED ORDER — LENALIDOMIDE 2.5 MG PO CAPS: ORAL_CAPSULE | ORAL | 0 refills | Status: DC

## 2020-02-26 ENCOUNTER — Other Ambulatory Visit: Payer: Self-pay

## 2020-02-26 ENCOUNTER — Ambulatory Visit (HOSPITAL_COMMUNITY)
Admission: RE | Admit: 2020-02-26 | Discharge: 2020-02-26 | Disposition: A | Discharge status: Home / Self Care | Payer: Medicare Other | Source: Ambulatory Visit | Attending: Hematology | Admitting: Hematology

## 2020-02-26 DIAGNOSIS — R161 Splenomegaly, not elsewhere classified: Secondary | ICD-10-CM | POA: Insufficient documentation

## 2020-02-26 DIAGNOSIS — Z79899 Other long term (current) drug therapy: Secondary | ICD-10-CM | POA: Insufficient documentation

## 2020-02-26 DIAGNOSIS — R937 Abnormal findings on diagnostic imaging of other parts of musculoskeletal system: Secondary | ICD-10-CM | POA: Diagnosis not present

## 2020-02-26 DIAGNOSIS — K769 Liver disease, unspecified: Secondary | ICD-10-CM | POA: Insufficient documentation

## 2020-02-26 DIAGNOSIS — C9000 Multiple myeloma not having achieved remission: Secondary | ICD-10-CM | POA: Insufficient documentation

## 2020-02-26 LAB — GLUCOSE, CAPILLARY: Glucose-Capillary: 138 mg/dL — ABNORMAL HIGH (ref 70–99)

## 2020-02-26 MED ORDER — FLUDEOXYGLUCOSE F - 18 (FDG) INJECTION
10.8000 | Freq: Once | INTRAVENOUS | Status: AC
  Administered 2020-02-26: 10.8 via INTRAVENOUS

## 2020-03-01 ENCOUNTER — Encounter (HOSPITAL_COMMUNITY): Payer: Self-pay | Admitting: Hematology

## 2020-03-07 ENCOUNTER — Encounter (HOSPITAL_COMMUNITY): Payer: Self-pay | Admitting: Hematology

## 2020-03-08 ENCOUNTER — Other Ambulatory Visit: Payer: Self-pay

## 2020-03-08 ENCOUNTER — Encounter (HOSPITAL_COMMUNITY): Payer: Self-pay | Admitting: Hematology

## 2020-03-08 ENCOUNTER — Inpatient Hospital Stay (HOSPITAL_COMMUNITY): Payer: Medicare Other

## 2020-03-08 ENCOUNTER — Inpatient Hospital Stay (HOSPITAL_COMMUNITY): Payer: Medicare Other | Attending: Hematology | Admitting: Hematology

## 2020-03-08 VITALS — BP 141/70 | HR 89 | Temp 96.9°F | Resp 16 | Wt 223.0 lb

## 2020-03-08 DIAGNOSIS — C9000 Multiple myeloma not having achieved remission: Secondary | ICD-10-CM | POA: Insufficient documentation

## 2020-03-08 DIAGNOSIS — Z7984 Long term (current) use of oral hypoglycemic drugs: Secondary | ICD-10-CM | POA: Insufficient documentation

## 2020-03-08 DIAGNOSIS — Z86718 Personal history of other venous thrombosis and embolism: Secondary | ICD-10-CM | POA: Diagnosis not present

## 2020-03-08 DIAGNOSIS — N184 Chronic kidney disease, stage 4 (severe): Secondary | ICD-10-CM | POA: Diagnosis not present

## 2020-03-08 DIAGNOSIS — Z905 Acquired absence of kidney: Secondary | ICD-10-CM | POA: Diagnosis not present

## 2020-03-08 DIAGNOSIS — Z8249 Family history of ischemic heart disease and other diseases of the circulatory system: Secondary | ICD-10-CM | POA: Diagnosis not present

## 2020-03-08 DIAGNOSIS — Z79899 Other long term (current) drug therapy: Secondary | ICD-10-CM | POA: Insufficient documentation

## 2020-03-08 DIAGNOSIS — E1122 Type 2 diabetes mellitus with diabetic chronic kidney disease: Secondary | ICD-10-CM | POA: Insufficient documentation

## 2020-03-08 DIAGNOSIS — Z7901 Long term (current) use of anticoagulants: Secondary | ICD-10-CM | POA: Diagnosis not present

## 2020-03-08 DIAGNOSIS — G4700 Insomnia, unspecified: Secondary | ICD-10-CM | POA: Insufficient documentation

## 2020-03-08 DIAGNOSIS — Z85528 Personal history of other malignant neoplasm of kidney: Secondary | ICD-10-CM | POA: Diagnosis not present

## 2020-03-08 LAB — CBC WITH DIFFERENTIAL/PLATELET
Abs Immature Granulocytes: 0.07 10*3/uL (ref 0.00–0.07)
Basophils Absolute: 0.1 10*3/uL (ref 0.0–0.1)
Basophils Relative: 1 %
Eosinophils Absolute: 0.2 10*3/uL (ref 0.0–0.5)
Eosinophils Relative: 4 %
HCT: 27.8 % — ABNORMAL LOW (ref 39.0–52.0)
Hemoglobin: 9.2 g/dL — ABNORMAL LOW (ref 13.0–17.0)
Immature Granulocytes: 2 %
Lymphocytes Relative: 31 %
Lymphs Abs: 1.3 10*3/uL (ref 0.7–4.0)
MCH: 34.1 pg — ABNORMAL HIGH (ref 26.0–34.0)
MCHC: 33.1 g/dL (ref 30.0–36.0)
MCV: 103 fL — ABNORMAL HIGH (ref 80.0–100.0)
Monocytes Absolute: 0.4 10*3/uL (ref 0.1–1.0)
Monocytes Relative: 11 %
Neutro Abs: 2.1 10*3/uL (ref 1.7–7.7)
Neutrophils Relative %: 51 %
Platelets: 55 10*3/uL — ABNORMAL LOW (ref 150–400)
RBC: 2.7 MIL/uL — ABNORMAL LOW (ref 4.22–5.81)
RDW: 16.1 % — ABNORMAL HIGH (ref 11.5–15.5)
WBC: 4.2 10*3/uL (ref 4.0–10.5)
nRBC: 0.7 % — ABNORMAL HIGH (ref 0.0–0.2)

## 2020-03-08 LAB — COMPREHENSIVE METABOLIC PANEL
ALT: 30 U/L (ref 0–44)
AST: 21 U/L (ref 15–41)
Albumin: 4 g/dL (ref 3.5–5.0)
Alkaline Phosphatase: 67 U/L (ref 38–126)
Anion gap: 12 (ref 5–15)
BUN: 52 mg/dL — ABNORMAL HIGH (ref 8–23)
CO2: 22 mmol/L (ref 22–32)
Calcium: 9.5 mg/dL (ref 8.9–10.3)
Chloride: 103 mmol/L (ref 98–111)
Creatinine, Ser: 3.44 mg/dL — ABNORMAL HIGH (ref 0.61–1.24)
GFR calc Af Amer: 20 mL/min — ABNORMAL LOW (ref >60)
GFR calc non Af Amer: 17 mL/min — ABNORMAL LOW (ref >60)
Glucose, Bld: 222 mg/dL — ABNORMAL HIGH (ref 70–99)
Potassium: 4.2 mmol/L (ref 3.5–5.1)
Sodium: 137 mmol/L (ref 135–145)
Total Bilirubin: 1 mg/dL (ref 0.3–1.2)
Total Protein: 6.9 g/dL (ref 6.5–8.1)

## 2020-03-08 NOTE — Assessment & Plan Note (Addendum)
1.  IgG kappa multiple myeloma: -4 cycles of RVD from 08/30/2017 through 02/18/2018.  Declined bone marrow transplant.  Currently on Revlimid 2.5 mg 3 weeks on 1 week off. -Free kappa light chains on 02/09/2020 increased to 2110.  Ratio is 57.1.  M spike is 0.1. -We reviewed results of the bone marrow biopsy from 02/23/2020.  21% plasma cells were seen.  Occasional sections had up to 75% myeloma cells.  Chromosome analysis was normal.  FISH panel was positive for gain of 1 q., monosomy 13, del 17 P, t(14:20). -PET scan showed new left frontal destructive lesion.  Numerous areas of lytic changes in the spine.  Lucent area in T6 with loss of height above at T5.  Numerous lytic changes throughout the spine noted along the pedicle and lamina and transverse process of T8 vertebral body.  Profound hypermetabolic activity seen within the ribs bilaterally. -He complained of pain below the right scapula on certain movements, particularly getting up to the bed. -I have recommended a thoracic spine MRI.  Because of his CKD we cannot give contrast.  We will do it without contrast. -I have recommended switching therapy to carfilzomib-based regimen based on high risk FISH panel.  However he has baseline CKD and creatinine has gone up about 3.  Hence will consider daratumumab based regimen.  DPD is an option.  He will also need a port placement.  He does not report any cardiac issues.  However will obtain a baseline echocardiogram.  2.  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 -He follows up with nephrology.  Creatinine today is 3.44 with normal calcium.  3.  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  He is on Eliquis.  No bleeding issues.  4.  Diabetes: -Tresiba 30 units daily and glipizide 5 mg in the mornings.  5.  Insomnia: -Continue trazodone 100 mg at bedtime.

## 2020-03-08 NOTE — Progress Notes (Signed)
[place], [place] [id number]   CLINIC:  Medical Oncology/Hematology  PCP:  Asencion Noble, MD [address] [place] [address] [phone]   REASON FOR VISIT:  Multiple myeloma.   BRIEF ONCOLOGIC HISTORY:  Oncology History  Multiple myeloma ([place])  08/29/2017 Initial Diagnosis   Multiple myeloma ([place])   12/02/2017 -  Chemotherapy   The patient had bortezomib SQ (VELCADE) chemo injection 2.75 mg, 1.3 mg/m2 = 2.75 mg, Subcutaneous,  Once, 5 of 5 cycles Administration: 2.75 mg (12/10/2017), 2.75 mg (12/17/2017), 2.75 mg (12/31/2017), 2.75 mg (01/21/2018), 2.75 mg (02/18/2018), 2.75 mg (03/11/2018)  for chemotherapy treatment.       CANCER STAGING: Cancer Staging No matching staging information was found for the patient.   INTERVAL HISTORY:  Tom Marshall 69 y.o. male seen for follow-up of multiple myeloma.  Continues to have pain in the right posterior chest wall below the scapula.  Appetite is 100%.  Energy levels are 50%.  He has pain only when he is trying to get out of the bed.  Numbness in the hands and feet has been stable.  No headaches or vision changes.  REVIEW OF SYSTEMS:  Review of Systems  Neurological: Positive for numbness.  All other systems reviewed and are negative.    PAST MEDICAL/SURGICAL HISTORY:  Past Medical History:  Diagnosis Date  . Cancer ([place])    multiple myeloma  . Cancer of right kidney ([place])   . Cervical dystonia   . Diabetes mellitus without complication [facility])    Past Surgical History:  Procedure Laterality Date  . BONE MARROW BIOPSY    . CHOLECYSTECTOMY  2007  . COLONOSCOPY WITH PROPOFOL N/A 01/16/2018   Procedure: COLONOSCOPY WITH PROPOFOL;  Surgeon: Daneil Dolin, MD;  Location[facility];  Service: Endoscopy;  Laterality: N/A;  1:45pm  . ESOPHAGOGASTRODUODENOSCOPY (EGD) WITH PROPOFOL N/A 01/16/2018   Procedure: ESOPHAGOGASTRODUODENOSCOPY (EGD) WITH PROPOFOL;  Surgeon: Daneil Dolin,  MD;  Location: [facility];  Service: Endoscopy;  Laterality: N/A;  . GIVENS CAPSULE STUDY N/A 05/12/2018   Procedure: GIVENS CAPSULE STUDY;  Surgeon: Daneil Dolin, MD;  Location: [facility];  Service: Endoscopy;  Laterality: N/A;  7:30am  . NEPHRECTOMY Right 1998   cancer     SOCIAL HISTORY:  Social History   Socioeconomic History  . Marital status: Single    Spouse name: Not on file  . Number of children: Not on file  . Years of education: Not on file  . Highest education level: Not on file  Occupational History  . Occupation: Marine scientist, Insurance underwriter  Tobacco Use  . Smoking status: Never Smoker  . Smokeless tobacco: Former Systems developer    Types: Chew  Substance and Sexual Activity  . Alcohol use: No  . Drug use: No  . Sexual activity: Not Currently  Other Topics Concern  . Not on file  Social History Narrative  . Not on file   Social Determinants of Health   Financial Resource Strain:   . Difficulty of Paying Living Expenses:   Food Insecurity:   . Worried About Charity fundraiser in the Last Year:   . Arboriculturist in the Last Year:   Transportation Needs:   . Film/video editor (Medical):   [name] Lack of Transportation (Non-Medical):   Physical Activity:   . Days of Exercise per Week:   . Minutes of Exercise per Session:   Stress:   .  Feeling of Stress :   Social Connections:   . Frequency of Communication with Friends and Family:   . Frequency of Social Gatherings with Friends and Family:   . Attends Religious Services:   . Active Member of Clubs or Organizations:   . Attends Archivist Meetings:   [name] Marital Status:   Intimate Partner Violence:   . Fear of Current or Ex-Partner:   . Emotionally Abused:   [name] Physically Abused:   . Sexually Abused:     FAMILY HISTORY:  Family History  Problem Relation Age of Onset  . Heart failure Mother 102  . Dementia Father   . Colon cancer Neg Hx     CURRENT MEDICATIONS:  Outpatient Encounter  Medications as of 03/08/2020  Medication Sig  . allopurinol (ZYLOPRIM) 300 MG tablet Take 300 mg by mouth every morning.  . B-D UF III MINI PEN NEEDLES 31G X 5 MM MISC   . cyclobenzaprine (FLEXERIL) 10 MG tablet Take 10 mg by mouth as needed.   . diphenhydramine-acetaminophen (TYLENOL PM) 25-500 MG TABS tablet Take 1 tablet by mouth at bedtime as needed.  . diphenoxylate-atropine (LOMOTIL) 2.5-0.025 MG tablet   . ELIQUIS 2.5 MG TABS tablet TAKE 1 TABLET BY MOUTH TWICE DAILY AFTER COMPLETION OF THE STARTER PACK.  . furosemide (LASIX) 40 MG tablet Take 40 mg by mouth every Monday, Wednesday, and Friday.   . gabapentin (NEURONTIN) 300 MG capsule TAKE 1 CAPSULE BY MOUTH AT BEDTIME  . glipiZIDE (GLUCOTROL XL) 5 MG 24 hr tablet Take 5 mg by mouth daily with breakfast.   . lenalidomide (REVLIMID) 2.5 MG capsule Take 1 capsule (2.5 mg total) by mouth daily.  . Multiple Vitamins-Minerals (CENTRUM SILVER 50+MEN) TABS Take 1 tablet by mouth every morning.  . pravastatin (PRAVACHOL) 20 MG tablet Take 20 mg by mouth at bedtime.   . sodium bicarbonate 650 MG tablet Take 1 tablet (650 mg total) by mouth 2 (two) times daily. (Patient taking differently: Take 650 mg by mouth 4 (four) times daily. Pt takes two  in am and two at night)  . traZODone (DESYREL) 100 MG tablet Take 1 tablet (100 mg total) by mouth at bedtime.  . TRESIBA FLEXTOUCH 200 UNIT/ML SOPN Inject 40 Units into the skin daily.   . TRUE METRIX BLOOD GLUCOSE TEST test strip   . [DISCONTINUED] dicyclomine (BENTYL) 10 MG capsule Take 10 mg by mouth 2 (two) times daily as needed.   Facility-Administered Encounter Medications as of 03/08/2020  Medication  . 0.9 %  sodium chloride infusion    ALLERGIES:  No Known Allergies   PHYSICAL EXAM:  ECOG Performance status: 1  Vitals:   03/08/20 1100  BP: (!) 141/70  Pulse: 89  Resp: 16  Temp: (!) 96.9 F (36.1 C)  SpO2: 99%   Filed Weights   03/08/20 1100  Weight: 223 lb (101.2 kg)     Physical Exam Constitutional:      Appearance: Normal appearance.  Cardiovascular:     Rate and Rhythm: Normal rate and regular rhythm.     Heart sounds: Normal heart sounds.  Pulmonary:     Effort: Pulmonary effort is normal.     Breath sounds: Normal breath sounds.  Abdominal:     Palpations: Abdomen is soft. There is no mass.     Tenderness: There is no abdominal tenderness.  Musculoskeletal:        General: No swelling.  Skin:    General: Skin is warm.  Neurological:     General: No focal deficit present.     Mental Status: He is alert and oriented to person, place, and time.  Psychiatric:        Mood and Affect: Mood normal.        Behavior: Behavior normal.      LABORATORY DATA:  I have reviewed the labs as listed.  CBC    Component Value Date/Time   WBC 4.2 03/08/2020 1230   RBC 2.70 (L) 03/08/2020 1230   HGB 9.2 (L) 03/08/2020 1230   HCT 27.8 (L) 03/08/2020 1230   PLT 55 (L) 03/08/2020 1230   MCV 103.0 (H) 03/08/2020 1230   MCH 34.1 (H) 03/08/2020 1230   MCHC 33.1 03/08/2020 1230   RDW 16.1 (H) 03/08/2020 1230   LYMPHSABS 1.3 03/08/2020 1230   MONOABS 0.4 03/08/2020 1230   EOSABS 0.2 03/08/2020 1230   BASOSABS 0.1 03/08/2020 1230   CMP Latest Ref Rng & Units 03/08/2020 02/09/2020 12/07/2019  Glucose 70 - 99 mg/dL 222(H) 191(H) 228(H)  BUN 8 - 23 mg/dL 52(H) 51(H) 34(H)  Creatinine 0.61 - 1.24 mg/dL 3.44(H) 3.68(H) 2.90(H)  Sodium 135 - 145 mmol/L 137 137 136  Potassium 3.5 - 5.1 mmol/L 4.2 5.1 4.8  Chloride 98 - 111 mmol/L 103 105 101  CO2 22 - 32 mmol/L 22 21(L) 24  Calcium 8.9 - 10.3 mg/dL 9.5 8.9 9.0  Total Protein 6.5 - 8.1 g/dL 6.9 6.6 7.1  Total Bilirubin 0.3 - 1.2 mg/dL 1.0 0.7 0.9  Alkaline Phos 38 - 126 U/L 67 67 113  AST 15 - 41 U/L 21 14(L) 20  ALT 0 - 44 U/L '30 23 30       ' DIAGNOSTIC IMAGING:  I have reviewed the images with the patient.   I have reviewed Venita Lick LPN's note and agree with the documentation.  I personally  performed a face-to-face visit, made revisions and my assessment and plan is as follows.    ASSESSMENT & PLAN:   Multiple myeloma ([place]) 1.  IgG kappa multiple myeloma: -4 cycles of RVD from 08/30/2017 through 02/18/2018.  Declined bone marrow transplant.  Currently on Revlimid 2.5 mg 3 weeks on 1 week off. -Free kappa light chains on 02/09/2020 increased to 2110.  Ratio is 57.1.  M spike is 0.1. -We reviewed results of the bone marrow biopsy from 02/23/2020.  21% plasma cells were seen.  Occasional sections had up to 75% myeloma cells.  Chromosome analysis was normal.  FISH panel was positive for gain of 1 q., monosomy 13, del 17 P, t(14:20). -PET scan showed new left frontal destructive lesion.  Numerous areas of lytic changes in the spine.  Lucent area in T6 with loss of height above at T5.  Numerous lytic changes throughout the spine noted along the pedicle and lamina and transverse process of T8 vertebral body.  Profound hypermetabolic activity seen within the ribs bilaterally. -He complained of pain below the right scapula on certain movements, particularly getting up to the bed. -I have recommended a thoracic spine MRI.  Because of his CKD we cannot give contrast.  We will do it without contrast. -I have recommended switching therapy to carfilzomib-based regimen based on high risk FISH panel.  However he has baseline CKD and creatinine has gone up about 3.  Hence will consider daratumumab based regimen.  DPD is an option.  He will also need a port placement.  He does not report any cardiac issues.  However will  obtain a baseline echocardiogram.  2.  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 -He follows up with nephrology.  Creatinine today is 3.44 with normal calcium.  3.  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  He is on Eliquis.  No bleeding issues.  4.  Diabetes: -Tresiba 30 units daily and glipizide 5 mg in the mornings.  5.  Insomnia: -Continue trazodone 100 mg  at bedtime.     Orders placed this encounter:  Orders Placed This Encounter  Procedures  . MR Thoracic Spine Wo Contrast      Derek Jack, MD [place] [phone]

## 2020-03-08 NOTE — Patient Instructions (Addendum)
[place] at [facility] Discharge Instructions  You were seen today by Dr. Delton Coombes. He went over your recent test results. He would like top get an MRI of your spine due to your pain. He discussed starting you on a new treatment called KPD, Kyprolis, Pomalyst and Dexamethasone. He reviewed the medications with you and discussed possible side effects. You will need a port placed. He will see you back in 2 weeks for labs, treatment and follow up.   Thank you for choosing [place] at [facility] to provide your oncology and hematology care.  To afford each patient quality time with our provider, please arrive at least 15 minutes before your scheduled appointment time.   If you have a lab appointment with the [facility] please come in thru the  Main Entrance and check in at the main information desk  You need to re-schedule your appointment should you arrive 10 or more minutes late.  We strive to give you quality time with our providers, and arriving late affects you and other patients whose appointments are after yours.  Also, if you no show three or more times for appointments you may be dismissed from the clinic at the providers discretion.     Again, thank you for choosing [facility].  Our hope is that these requests will decrease the amount of time that you wait before being seen by our physicians.       _____________________________________________________________  Should you have questions after your visit to [facility], please contact our office at [phone] between the hours of 8:00 a.m. and 4:30 p.m.  Voicemails left after 4:00 p.m. will not be returned until the following business day.  For prescription refill requests, have your pharmacy contact our office and allow 72 hours.    Cancer Center Support Programs:   > Cancer Support Group  2nd Tuesday of the month 1pm-2pm, Journey Room

## 2020-03-11 DIAGNOSIS — E1129 Type 2 diabetes mellitus with other diabetic kidney complication: Secondary | ICD-10-CM | POA: Diagnosis not present

## 2020-03-11 DIAGNOSIS — C9000 Multiple myeloma not having achieved remission: Secondary | ICD-10-CM | POA: Diagnosis not present

## 2020-03-11 DIAGNOSIS — D696 Thrombocytopenia, unspecified: Secondary | ICD-10-CM | POA: Diagnosis not present

## 2020-03-14 NOTE — Patient Instructions (Signed)
[place] are diagnosed with relapsed multiple myeloma.  You will be treated with a combination of drugs to treat your disease.  You will receive daratumumab (Darzalex) in the clinic weekly for 8 weeks, then every two weeks for 8 doses, then monthly.  You will also be taking a chemotherapy pill, pomalidomide (Pomalyst) and a steroid, dexamethasone, as part of your treatment.  The intent of treatment is to control your disease, keep it from spreading further, and to help alleviate any symptoms you may be having related to your disease. You will see the doctor regularly throughout treatment.  We will obtain blood work from you prior to every treatment and monitor your results to make sure it is safe to give your treatment. The doctor monitors your response to treatment by the way you are feeling, your blood work, and by obtaining scans periodically.  There will be wait times while you are here for treatment.  It will take about 30 minutes to 1 hour for your lab work to result.  Then there will be wait times while pharmacy mixes your medications.      Daratumumab (Darzalex)  About This Drug Daratumumab is used to treat cancer. It is given in the vein (IV) or under the skin of your belly (subcutaneously).  Possible Side Effects . While you are getting this drug in your vein (IV), you may have a reaction to the drug. Sometimes you may be given medication to stop or lessen these side effects. Your nurse will check you closely for these signs: fever or shaking chills, flushing, facial swelling, feeling dizzy, headache, trouble breathing, rash, itching, chest tightness, or chest pain. These reactions may happen after your infusion. If this happens, call 911 for emergency care.  . Decrease in the number of white blood cells and platelets. This may raise your risk of infection, and raise your risk of bleeding.  . Fever and chills  . Tiredness  . Feeling  dizzy  . Trouble sleeping  . Cough and trouble breathing  . Upper respiratory infection  . Nausea and throwing up (vomiting)  . Loose bowel movements (diarrhea)  . Constipation (not able to move bowels)  . Muscle spasms  . Pain in the joints  . Back pain  . Swelling of your legs, ankles and/or feet  . Effects on the nerves are called peripheral neuropathy. You may feel numbness, tingling, or pain in your hands and feet. It may be hard for you to button your clothes, open jars, or walk as usual. The effect on the nerves may get worse with more doses of the drug. These effects get better in some people after the drug is stopped but it does not get better in all people.  Note: Each of the side effects above was reported in 20% or greater of patients treated with daratumumab. Not all possible side effects are included above.  Warnings and Precautions . Severe decrease in the number of white blood cells and platelets  . Severe reaction to the drug  . This medication can affect the results of blood tests that match your blood type. Your blood type will be tested before treatment. Be sure to tell all healthcare providers you are taking this medicine before receiving blood transfusions, even for 6 months after your last dose.  Important Information . This drug may be present in the saliva, tears, sweat, urine, stool, vomit, semen, and vaginal secretions. Talk to your doctor and/or  your nurse about the necessary precautions to take during this time.  Treating Side Effects . Drink plenty of fluids (a minimum of eight glasses per day is recommended).  . If you throw up or have loose bowel movements, you should drink more fluids so that you do not become dehydrated (lack of water in the body from losing too much fluid).  . To help with nausea and vomiting, eat small, frequent meals instead of three large meals a day. Choose foods and drinks that are at room temperature. Ask your nurse  or doctor about other helpful tips and medicine that is available to help stop or lessen these symptoms.  . If you have diarrhea, eat low-fiber foods that are high in protein and calories and avoid foods that can irritate your digestive tracts or lead to cramping.  . Ask your nurse or doctor about medicine that can lessen or stop your diarrhea or constipation.  . If you are not able to move your bowels, check with your doctor or nurse before you use enemas, laxatives, or suppositories.  . Manage tiredness by pacing your activities for the day.  . Be sure to include periods of rest between energy-draining activities.  . To decrease the risk of infection, wash your hands regularly.  . Avoid close contact with people who have a cold, the flu, or other infections.  . Take your temperature as your doctor or nurse tells you, and whenever you feel like you may have a fever.  . To help decrease the risk of bleeding, use a soft toothbrush. Check with your nurse before using dental floss.  . Be very careful when using knives or tools.  . Use an electric shaver instead of a razor.  [name] Keeping your pain under control is important to your well-being. Please tell your doctor or nurse if you have pain uncontrolled by your medications.  . If you are dizzy, get up slowly after sitting or lying.  . If you are having trouble sleeping, talk to your nurse or doctor on tips to help you sleep better.  . If you have numbness and tingling in your hands and feet, be careful when cooking, walking, and handling sharp objects and hot liquids.  . Infusion reactions may rarely occur after your infusion. If this happens, call 911 for emergency care.  Food and Drug Interactions . There are no known interactions of daratumumab with food.  . This drug may interact with other medicines. Tell your doctor and pharmacist about all the prescription and over-the-counter medicines and dietary supplements (vitamins,  minerals, herbs and others) that you are taking at this time. Also, check with your doctor or pharmacist before starting any new prescription or over-the-counter medicines, or dietary supplements to make sure that there are no interactions.  When to Call the Doctor Call your doctor or nurse if you have any of these symptoms and/or any new or unusual symptoms:  . Fever of 100.4 F (38 C) or higher  . Chills  . Pain in your chest  . Coughing up yellow, green, or bloody mucus.  . Wheezing or trouble breathing  . Tiredness that interferes with your daily activities  . Trouble falling or staying asleep  . Feeling dizzy or lightheaded  . Easy bleeding or bruising  . Nausea that stops you from eating or drinking and/or is not relieved by prescribed medicines  . Throwing up  . No bowel movement in 3 days or when you feel uncomfortable.  [name]  Loose bowel movements (diarrhea) 4 times a day or loose bowel movements with lack of strength or a feeling of being dizzy  . Weight gain of 5 pounds in one week (fluid retention)  . Swelling of your legs, ankles and/or feet  . Pain that does not go away, or is not relieved by prescribed medicines  . Signs of infusion reaction: fever or shaking chills, flushing, facial swelling, feeling dizzy, headache, trouble breathing, rash, itching, chest tightness, or chest pain. If this happens, call 911 for emergency care.  . Numbness, tingling, or pain in your hands and feet  . If you think you may be pregnant or may have impregnated your partner  Reproduction Warnings . Pregnancy warning: This drug may have harmful effects on the unborn baby. Women of childbearing potential should use effective methods of birth control during your cancer treatment and for at least 3 months after treatment. Let your doctor know right away if you think you may be pregnant.  . Breastfeeding warning: It is not known if this drug passes into breast milk. For this reason,  women should talk to their doctor about the risks and benefits of breastfeeding during treatment with this drug because this drug may enter the breast milk and cause harm to a breastfeeding baby.  . Fertility warning: Human fertility studies have not been done with this drug. Talk with your doctor or nurse if you plan to have children. Ask for information on sperm or egg banking.  Dexamethasone (Decadron)  About This Drug Dexamethasone is used to treat cancer, to decrease inflammation and sometimes used before and after chemotherapy to prevent or treat nausea and/or vomiting. It is given in the vein (IV) or orally (by mouth).  Possible Side Effects . Headache . High blood pressure . Abnormal heart beat . Tiredness and weakness . Changes in mood, which may include depression or a feeling of extreme well-being . Trouble sleeping . Increased sweating . Increased appetite (increased hunger) . Weight gain . Increase risk of infections . Pain in your abdomen . Nausea . Skin changes such as rash, dryness, redness . Blood sugar levels may change . Electrolyte changes . Swelling of your legs, ankles and/or feet . Changes in your liver function . You may be at risk for cataracts, glaucoma or infections of the eye . Muscle loss and / or weakness (lack of muscle strength) . Increased risk of developing osteoporosis- your bones may become weak and brittle  Note: Not all possible side effects are included above.  Warnings and Precautions . This drug may cause you to feel irritable, nervous or restless.  . Allergic reactions, including anaphylaxis are rare but may happen in some patients. Signs of allergic reaction to this drug may be swelling of the face, feeling like your tongue or throat are swelling, trouble breathing, rash, itching, fever, chills, feeling dizzy, and/or feeling that your heart is beating in a fast or not normal way. If this happens, do not take another dose of this drug.  You should get urgent medical treatment.  . High blood pressure and changes in electrolytes, which can cause fluid build-up around your heart, lungs or elsewhere.  . Increased risk of developing a hole in your stomach, small, and/or large intestine if you have ulcers in the lining of your stomach and/or intestine, or have diverticulitis, ulcerative colitis and/or other diseases that affect the gastrointestinal tract.  . Effects on the endocrine glands including the pituitary, adrenals or thyroid during or after  use of this medication.  . Changes in the tissue of the heart, that can cause your heart to have less ability to pump blood. You may be short of breath or our arms, hands, legs and feet may swell.  . Increased risk of heart attack.  . Severe depression and other psychiatric disorders such as mood changes.  . Burning, pain and itching around your anus may happen when this drug is given in the vein too rapidly (IV). It usually happens suddenly and resolves in less than 1 minute.  Important Information . Talk to your doctor or your nurse before stopping this medication, it should be stopped gradually. Depending on the dose and length of treatment, you could experience serious side effects if stopped abruptly (suddenly).  . Talk to your doctor before receiving any vaccinations during your treatment. Some vaccinations are not recommended while receiving dexamethasone.  How to Take Your Medication . For Oral (by mouth): You can take the medicine with or without food. If you have nausea or upset stomach, take it with food.  . Missed dose: If you miss a dose, do not take 2 doses at the same time or extra doses.  . If you vomit a dose, take your next dose at the regular time. Do not take 2 doses at the same time  . Handling: Wash your hands after handling your medicine, your caretakers should not handle your medicine with bare hands and should wear latex gloves.  . Storage: Store this  medicine in the original container at room temperature. Protect from moisture and light. Discuss with your nurse or your doctor how to dispose of unused medicine.  Treating Side Effects . Drink plenty of fluids (a minimum of eight glasses per day is recommended). . To help with nausea and vomiting, eat small, frequent meals instead of three large meals a day. Choose foods and drinks that are at room temperature. Ask your nurse or doctor about other helpful tips and medicine that is available to help stop or lessen these symptoms.  . If you throw up, you should drink more fluids so that you do not become dehydrated (lack of water in the body from losing too much fluid).  . Manage tiredness by pacing your activities for the day.  . Be sure to include periods of rest between energy-draining activities.  . To help with muscle weakness, get regular exercise. If you feel too tired to exercise vigorously, try taking a short walk.  . If you are having trouble sleeping, talk to your nurse or doctor on tips to help you sleep better.  . If you are feeling depressed, talk to your nurse or doctor about it.  [name] Keeping your pain under control is important to your well-being. Please tell your doctor or nurse if you are experiencing pain.   . If you have diabetes, keep good control of your blood sugar level. Tell your nurse or your doctor if your glucose levels are higher or lower than normal.  . To decrease the risk of infection, wash your hands regularly.  . Avoid close contact with people who have a cold, the flu, or other infections.  . Take your temperature as your doctor or nurse tells you, and whenever you feel like you may have a fever.  . If you get a rash do not put anything on it unless your doctor or nurse says you may. Keep the area around the rash clean and dry. Ask your doctor for medicine  if your rash bothers you.  . Moisturize your skin several times day.  . Avoid sun exposure and  apply sunscreen routinely when outdoors.  Food and Drug Interactions . There are no known interactions of dexamethasone with food.  . Check with your doctor or pharmacist about all other prescription medicines and over-the-counter medicines and dietary supplements (vitamins, minerals, herbs and others) you are taking before starting this medicine as there are known drug interactions with dexamethasone. Also, check with your doctor or pharmacist before starting any new prescription or over-the-counter medicines, or dietary supplement to make sure that there are no interactions.  . There are known interactions of dexamethasone with other medicines and products like acetaminophen, aspirin, and ibuprofen. Ask your doctor what over-the-counter (OTC) medicines you can take.  When to Call the Doctor Call your doctor or nurse if you have any of these symptoms and/or any new or unusual symptoms:  . Fever of 100.4 F (38 C) or higher  . Chills  . A headache that does not go away  . Trouble breathing  . Blurry vision or other changes in eyesight  . Feel irritable, nervous or restless  . Trouble falling or staying asleep  . Severe mood changes such as depression or unusual thoughts and/or behaviors  . Thoughts of hurting yourself or others, and suicide  . Tiredness that interferes with your daily activities  . Feeling that your heart is beating in a fast, slow or not normal way  . Feeling dizzy or lightheaded  . Chest pain or symptoms of a heart attack. Most heart attacks involve pain in the center of the chest that lasts more than a few minutes. The pain may go away and come back, or it can be constant. It can feel like pressure, squeezing, fullness, or pain. Sometimes pain is felt in one or both arms, the back, neck, jaw, or stomach. If any of these symptoms last 2 minutes, call 911.  [name] Heartburn or indigestion  . Nausea that stops you from eating or drinking and/or is not relieved by  prescribed medicines  . Throwing up  . Pain in your abdomen that does not go away  . Abnormal blood sugar  . Unusual thirst, passing urine often, headache, sweating, shakiness, irritability  . Swelling of legs, ankles, or feet  . Weight gain of 5 pounds in one week (fluid retention)  . Signs of possible liver problems: dark urine, pale bowel movements, bad stomach pain, feeling very tired and weak, unusual itching, or yellowing of the eyes or skin  . Severe muscle weakness  . A new rash or a rash that is not relieved by prescribed medicines  . Signs of allergic reaction: swelling of the face, feeling like your tongue or throat are swelling, trouble breathing, rash, itching, fever, chills, feeling dizzy, and/or feeling that your heart is beating in a fast or not normal way. If this happens, call 911 for emergency care.  . If you think you may be pregnant  Reproduction Warnings . Pregnancy warning: It is not known if this drug may harm an unborn child. For this reason, be sure to talk with your doctor if you are pregnant or planning to become pregnant while receiving this drug. Let your doctor know right away if you think you may be pregnant or may have impregnated your partner.  . Breastfeeding warning: It is not known if this drug passes into breast milk. For this reason, women should talk to their doctor about  the risks and benefits of breastfeeding during treatment with this drug because this drug may enter the breast milk and cause harm to a breastfeeding baby.  . Fertility warning: Human fertility studies have not been done with this drug. Talk with your doctor or nurse if you plan to have children. Ask for information on sperm banking.   Pomalidomide (Pomalyst)  About This Drug Pomalidomide is used to treat cancer. It is given orally (by mouth).  Possible Side Effects . A decrease in the number of white blood cells. This may raise your risk of infection.  . A decrease  in the number of red blood cells. This may make you tired and weak.  . Nausea  . Diarrhea (loose bowel movements)  . Constipation (unable to move bowels)  . Fever  . Tiredness and weakness  . Back pain  . Upper respiratory infection  . Trouble breathing  Note: Each of the side effects above was reported in 30% or greater of patients treated with pomalidomide. Not all possible side effects are included above.  Warnings and Precautions . Blood clots and events such as stroke and heart attack. A blood clot in your leg may cause your leg to swell, appear red and warm, and/or cause pain. A blood clot in your lungs may cause trouble breathing, pain when breathing, and/or chest pain  . Severe bone marrow suppression.  . Changes in your liver function, which can cause liver failure and be life-threatening  . Allergic reactions, including anaphylaxis are rare but may happen in some patients. Signs of allergic reaction to this drug may be swelling of the face, feeling like your tongue or throat are swelling, trouble breathing, rash, itching, fever, chills, feeling dizzy, and/or feeling that your heart is beating in a fast or not normal way. If this happens, do not take another dose of this drug. You should get urgent medical treatment.  . Severe allergic skin reaction. You may develop blisters on your skin that are filled with fluid or a severe red rash all over your body that may be painful.  . Confusion and/or feeling dizzy, which may impair your ability to drive or use machinery. Use caution and tell your nurse or doctor if you feel dizzy or confused.  . Effects on the nerves are called peripheral neuropathy. You may feel numbness, tingling, or pain in your hands and feet. It may be hard for you to button your clothes, open jars, or walk as usual. The effect on the nerves may get worse with more doses of the drug. These effects get better in some people after the drug is stopped but it does  not get better in all people.  . Tumor lysis syndrome: This drug may act on the cancer cells very quickly. This may affect how your kidneys work and can be life-threatening.  . This drug may raise your risk of getting a second cancer.  Note: Some of the side effects above are very rare. If you have concerns and/or questions, please discuss them with your medical team.  Important Information . You will need to sign up for a special program called Pomalyst REMS when you start taking this drug. Your nurse will help you get started.  . Do not donate blood during your treatment and for 4 weeks after your treatment  . Men should not donate sperm during your treatment because this drug is present in semen and may cause harm to a baby.  . Avoid smoking while  taking pomalidomide as this may lower the levels of the drug in your body, which can make it less effective.  How to Take Your Medication . Swallow the medicine whole with water, with or without food. Do not chew, break, or open it.  . Take this medicine as directed at the same time each day.  . Missed dose: If you miss a dose, take it as soon as you think about it ONLY if it has been less than 12 hours since your regular time. If it has been more than 12 hours, skip the missed dose and contact your doctor. Take your next dose at the regular time. Do not take 2 doses at the same time and do not double up on the next dose.  . If you vomit a dose, take your next dose at the regular time.  . Handling: Wash your hands after handling your medicine; your caretakers should not handle your medicine with bare hands and should wear latex gloves.  . If you get any of the content of a broken capsules on your skin or inside of your mouth, you should wash the area of the skin well with soap and water right away. Flush your mouth with flowing water. Call your doctor if you get a skin reaction.  . This drug may be present in the saliva, tears, sweat,  urine, stool, vomit, semen, and vaginal secretions. Talk to your doctor and/or your nurse about the necessary precautions to take during this time.  . Storage: Store this medicine in the original container at room temperature.  . Disposal of unused medicine: Do not flush any expired and/or unused medicine down the toilet or drain unless you are specifically instructed to do so on the medication label. Some facilities have take-back programs and/or other options. If you do not have a take-back program in your area, then please discuss with your nurse or your doctor how to dispose of unused medicine.  Treating Side Effects . Manage tiredness by pacing your activities for the day.  . Be sure to include periods of rest between energy-draining activities.  . To decrease the risk of infection, wash your hands regularly.  . Avoid close contact with people who have a cold, the flu, or other infections.  . Take your temperature as your doctor or nurse tells you, and whenever you feel like you may have a fever.  . To help decrease bleeding, use a soft toothbrush. Check with your nurse before using dental floss.  . Be very careful when using knives or tools.  . Use an electric shaver instead of a razor.  . Drink plenty of fluids (a minimum of eight glasses per day is recommended).  . If you throw up or have loose bowel movements, you should drink more fluids so that you do not become dehydrated (lack of water in the body from losing too much fluid).  . If you have diarrhea, eat low-fiber foods that are high in protein and calories and avoid foods that can irritate your digestive tracts or lead to cramping.  . Ask your nurse or doctor about medicine that can lessen or stop your diarrhea and/or constipation  . To help with nausea and vomiting, eat small, frequent meals instead of three large meals a day. Choose foods and drinks that are at room temperature. Ask your nurse or doctor about other  helpful tips and medicine that is available to help stop or lessen these symptoms.  . If you are  not able to move your bowels, check with your doctor or nurse before you use enemas, laxatives, or suppositories.  [name] Keeping your pain under control is important to your well-being. Please tell your doctor or nurse if you are experiencing pain.  . If you have numbness and tingling in your hands and feet, be careful when cooking, walking, and handling sharp objects and hot liquids.  . If you get a rash do not put anything on it unless your doctor or nurse says you may. Keep the area around the rash clean and dry. Ask your doctor for medicine if your rash bothers you.  Food and Drug Interactions . There are no known interactions of pomalidomide with food.  . Check with your doctor or pharmacist about all other prescription medicines and over-the-counter medicines and dietary supplements (vitamins, minerals, herbs and others) you are taking before starting this medicine as there are known drug interactions with pomalidomide. Also, check with your doctor or pharmacist before starting any new prescription or over-the-counter medicines, or dietary supplements to make sure that there are no interactions.  When to Call the Doctor Call your doctor or nurse if you have any of these symptoms and/or any new or unusual symptoms:  . Fever of 100.4 F (38 C) or higher  . Chills  . Tiredness that interferes with your daily activities  . Feeling dizzy or lightheaded  . Easy bleeding or bruising  . Your leg or arm is swollen, red, warm and/or painful  . Chest pain or symptoms of a heart attack. Most heart attacks involve pain in the center of the chest that lasts more than a few minutes. The pain may go away and come back. It can feel like pressure, squeezing, fullness, or pain. Sometimes pain is felt in one or both arms, the back, neck, jaw, or stomach. If any of these symptoms last 2 minutes, call  911.  [name] Symptoms of a stroke such as sudden numbness or weakness of your face, arm, or leg, mostly on one side of your body; sudden confusion, trouble speaking or understanding; sudden trouble seeing in one or both eyes; sudden trouble walking, feeling dizzy, loss of balance or coordination; or sudden, bad headache with no known cause. If you have any of these symptoms for 2 minutes, call 911.  . Wheezing or trouble breathing  . Coughing up yellow, green, or bloody mucus  . Nausea that stops you from eating or drinking and/or is not relieved by prescribed medicines  . Diarrhea, 4 times in one day or diarrhea with lack of strength or a feeling of being dizzy  . No bowel movement in 3 days or when you feel uncomfortable  . Lasting loss of appetite or rapid weight loss of five pounds in a week  . Pain that does not go away, or is not relieved by prescribed medicines  . Signs of possible liver problems: dark urine, pale bowel movements, bad stomach pain, feeling very tired and weak, unusual itching, or yellowing of the eyes or skin  . Signs of tumor lysis: Confusion or agitation, decreased urine, nausea/vomiting, diarrhea, muscle cramping, numbness and/or tingling, seizures.  . Signs of allergic reaction: swelling of the face, feeling like your tongue or throat are swelling, trouble breathing, rash, itching, fever, chills, feeling dizzy, and/or feeling that your heart is beating in a fast or not normal way. If this happens, call 911 for emergency care.  . Numbness, tingling, or pain in your hands and feet  .  Flu-like symptoms: fever, headache, muscle and joint aches, and fatigue (low energy, feeling weak)  . A new rash or a rash that is not relieved by prescribed medicines  . If you think you may be pregnant or may have impregnated your partner  Reproduction Warnings . Pregnancy warning: This drug can have harmful effects on the unborn baby. Women of child bearing potential should use 2  effective methods of birth control, one of which must be a highly effective method of birth control, beginning 4 weeks before treatment starts, during your cancer treatment, including dose interruptions, and for at least 4 weeks after treatment. A highly effective method of birth control includes tubal ligation, intra-uterine device (IUD), hormonal (birth control).    SELF CARE ACTIVITIES WHILE RECEIVING CHEMOTHERAPY:  Hydration Increase your fluid intake 48 hours prior to treatment and drink at least 8 to 12 cups (64 ounces) of water/decaffeinated beverages per day after treatment. You can still have your cup of coffee or soda but these beverages do not count as part of your 8 to 12 cups that you need to drink daily. No alcohol intake.  Medications Continue taking your normal prescription medication as prescribed.  If you start any new herbal or new supplements please let us know first to make sure it is safe.  Mouth Care Have teeth cleaned professionally before starting treatment. Keep dentures and partial plates clean. Use soft toothbrush and do not use mouthwashes that contain alcohol. Biotene is a good mouthwash that is available at most pharmacies or may be ordered by calling [phone]. Use warm salt water gargles (1 teaspoon salt per 1 quart warm water) before and after meals and at bedtime. If you need dental work, please let the doctor know before you go for your appointment so that we can coordinate the best possible time for you in regards to your chemo regimen. You need to also let your dentist know that you are actively taking chemo. We may need to do labs prior to your dental appointment.  Skin Care Always use sunscreen that has not expired and with SPF (Sun Protection Factor) of 50 or higher. Wear hats to protect your head from the sun. Remember to use sunscreen on your hands, ears, face, & feet.  Use good moisturizing lotions such as udder cream, eucerin, or even Vaseline. Some  chemotherapies can cause dry skin, color changes in your skin and nails.    . Avoid long, hot showers or baths. . Use gentle, fragrance-free soaps and laundry detergent. . Use moisturizers, preferably creams or ointments rather than lotions because the thicker consistency is better at preventing skin dehydration. Apply the cream or ointment within 15 minutes of showering. Reapply moisturizer at night, and moisturize your hands every time after you wash them.  Hair Loss (if your doctor says your hair will fall out)  . If your doctor says that your hair is likely to fall out, decide before you begin chemo whether you want to wear a wig. You may want to shop before treatment to match your hair color. . Hats, turbans, and scarves can also camouflage hair loss, although some people prefer to leave their heads uncovered. If you go bare-headed outdoors, be sure to use sunscreen on your scalp. . Cut your hair short. It eases the inconvenience of shedding lots of hair, but it also can reduce the emotional impact of watching your hair fall out. . Don't perm or color your hair during chemotherapy. Those chemical treatments are  already damaging to hair and can enhance hair loss. Once your chemo treatments are done and your hair has grown back, it's OK to resume dyeing or perming hair.  With chemotherapy, hair loss is almost always temporary. But when it grows back, it may be a different color or texture. In older adults who still had hair color before chemotherapy, the new growth may be completely gray.  Often, new hair is very fine and soft.  Infection Prevention Please wash your hands for at least 30 seconds using warm soapy water. Handwashing is the #1 way to prevent the spread of germs. Stay away from sick people or people who are getting over a cold. If you develop respiratory systems such as green/yellow mucus production or productive cough or persistent cough let us know and we will see if you need an  antibiotic. It is a good idea to keep a pair of gloves on when going into grocery stores/[employer] to decrease your risk of coming into contact with germs on the carts, etc. Carry alcohol hand gel with you at all times and use it frequently if out in public. If your temperature reaches 100.4 or higher please call the clinic and let us know.  If it is after hours or on the weekend please go to the ER if your temperature is over 100.4.  Please have your own personal thermometer at home to use.    Sex and bodily fluids If you are going to have sex, a condom must be used to protect the person that isn't taking chemotherapy. Chemo can decrease your libido (sex drive). For a few days after chemotherapy, chemotherapy can be excreted through your bodily fluids.  When using the toilet please close the lid and flush the toilet twice.  Do this for a few day after you have had chemotherapy.   Effects of chemotherapy on your sex life Some changes are simple and won't last long. They won't affect your sex life permanently.  Sometimes you may feel: . too tired . not strong enough to be very active . sick or sore  . not in the mood . anxious or low  Your anxiety might not seem related to sex. For example, you may be worried about the cancer and how your treatment is going. Or you may be worried about money, or about how you family are coping with your illness.  These things can cause stress, which can affect your interest in sex. It's important to talk to your partner about how you feel.  Remember - the changes to your sex life don't usually last long. There's usually no medical reason to stop having sex during chemo. The drugs won't have any long term physical effects on your performance or enjoyment of sex. Cancer can't be passed on to your partner during sex  Contraception It's important to use reliable contraception during treatment. Avoid getting pregnant while you or your partner are having chemotherapy. This  is because the drugs may harm the baby. Sometimes chemotherapy drugs can leave a man or woman infertile.  This means you would not be able to have children in the future. You might want to talk to someone about permanent infertility. It can be very difficult to learn that you may no longer be able to have children. Some people find counselling helpful. There might be ways to preserve your fertility, although this is easier for men than for women. You may want to speak to a fertility expert. You can talk  about sperm banking or harvesting your eggs. You can also ask about other fertility options, such as donor eggs. If you have or have had breast cancer, your doctor might advise you not to take the contraceptive pill. This is because the hormones in it might affect the cancer. It is not known for sure whether or not chemotherapy drugs can be passed on through semen or secretions from the vagina. Because of this some doctors advise people to use a barrier method if you have sex during treatment. This applies to vaginal, anal or oral sex. Generally, doctors advise a barrier method only for the time you are actually having the treatment and for about a week after your treatment. Advice like this can be worrying, but this does not mean that you have to avoid being intimate with your partner. You can still have close contact with your partner and continue to enjoy sex.  Animals If you have cats or birds we just ask that you not change the litter or change the cage.  Please have someone else do this for you while you are on chemotherapy.   Food Safety During and After Cancer Treatment Food safety is important for people both during and after cancer treatment. Cancer and cancer treatments, such as chemotherapy, radiation therapy, and stem cell/bone marrow transplantation, often weaken the immune system. This makes it harder for your body to protect itself from foodborne illness, also called food poisoning. Foodborne  illness is caused by eating food that contains harmful bacteria, parasites, or viruses.  Foods to avoid Some foods have a higher risk of becoming tainted with bacteria. These include: [name] Unwashed fresh fruit and vegetables, especially leafy vegetables that can hide dirt and other contaminants . Raw sprouts, such as alfalfa sprouts . Raw or undercooked beef, especially ground beef, or other raw or undercooked meat and poultry . Fatty, fried, or spicy foods immediately before or after treatment.  These can sit heavy on your stomach and make you feel nauseous. . Raw or undercooked shellfish, such as oysters. . Sushi and sashimi, which often contain raw fish.  . Unpasteurized beverages, such as unpasteurized fruit juices, raw milk, raw yogurt, or cider . Undercooked eggs, such as soft boiled, over easy, and poached; raw, unpasteurized eggs; or foods made with raw egg, such as homemade raw cookie dough and homemade mayonnaise  Simple steps for food safety  Shop smart. . Do not buy food stored or displayed in an unclean area. . Do not buy bruised or damaged fruits or vegetables. . Do not buy cans that have cracks, dents, or bulges. . Pick up foods that can spoil at the end of your shopping trip and store them in a cooler on the way home.  Prepare and clean up foods carefully. . Rinse all fresh fruits and vegetables under running water, and dry them with a clean towel or paper towel. . Clean the top of cans before opening them. . After preparing food, wash your hands for 20 seconds with hot water and soap. Pay special attention to areas between fingers and under nails. . Clean your utensils and dishes with hot water and soap. [name] Disinfect your kitchen and cutting boards using 1 teaspoon of liquid, unscented bleach mixed into 1 quart of water.    Dispose of old food. . Eat canned and packaged food before its expiration date (the "use by" or "best before" date). . Consume refrigerated leftovers  within 3 to 4 days. After that time, throw  out the food. Even if the food does not smell or look spoiled, it still may be unsafe. Some bacteria, such as Listeria, can grow even on foods stored in the refrigerator if they are kept for too long.  Take precautions when eating out. . At restaurants, avoid buffets and salad bars where food sits out for a long time and comes in contact with many people. Food can become contaminated when someone with a virus, often a norovirus, or another "bug" handles it. . Put any leftover food in a "to-go" container yourself, rather than having the server do it. And, refrigerate leftovers as soon as you get home. . Choose restaurants that are clean and that are willing to prepare your food as you order it cooked.   AT HOME MEDICATIONS:                                                                                                                                                                Compazine/Prochlorperazine 81m tablet. Take 1 tablet every 6 hours as needed for nausea/vomiting. (This can make you sleepy)   EMLA cream. Apply a quarter size amount to port site 1 hour prior to chemo. Do not rub in. Cover with plastic wrap.    Diarrhea Sheet   If you are having loose stools/diarrhea, please purchase Imodium and begin taking as outlined:  At the first sign of poorly formed or loose stools you should begin taking Imodium (loperamide) 2 mg capsules.  Take two tablets (429m followed by one tablet (54m51mevery 2 hours - DO NOT EXCEED 8 tablets in 24 hours.  If it is bedtime and you are having loose stools, take 2 tablets at bedtime, then 2 tablets every 4 hours until morning.   Always call the [facility] you are having loose stools/diarrhea that you can't get under control.  Loose stools/diarrhea leads to dehydration (loss of water) in your body.  We have other options of trying to get the loose stools/diarrhea to stop but you must let us [place]!   Constipation Sheet  Colace - 100 mg capsules - take 2 capsules daily.  If this doesn't help then you can increase to 2 capsules twice daily.  Please call if the above does not work for you. Do not go more than 2 days without a bowel movement.  It is very important that you do not become constipated.  It will make you feel sick to your stomach (nausea) and can cause abdominal pain and vomiting.  Nausea Sheet   Compazine/Prochlorperazine 38m64mblet. Take 1 tablet every 6 hours as needed for nausea/vomiting (This can make you drowsy).  If you are having persistent nausea (nausea that does not stop) please call the [facility] let us [place]  the amount of nausea that you are experiencing.  If you begin to vomit, you need to call the [facility] and if it is the weekend and you have vomited more than one time and can't get it to stop-go to the Emergency Room.  Persistent nausea/vomiting can lead to dehydration (loss of fluid in your body) and will make you feel very weak and unwell. Ice chips, sips of clear liquids, foods that are at room temperature, crackers, and toast tend to be better tolerated.   SYMPTOMS TO REPORT AS SOON AS POSSIBLE AFTER TREATMENT:  FEVER GREATER THAN 100.4 F  CHILLS WITH OR WITHOUT FEVER  NAUSEA AND VOMITING THAT IS NOT CONTROLLED WITH YOUR NAUSEA MEDICATION  UNUSUAL SHORTNESS OF BREATH  UNUSUAL BRUISING OR BLEEDING  TENDERNESS IN MOUTH AND THROAT WITH OR WITHOUT PRESENCE OF ULCERS  URINARY PROBLEMS  BOWEL PROBLEMS  UNUSUAL RASH      Wear comfortable clothing and clothing appropriate for easy access to any Portacath or PICC line. Let us know if there is anything that we can do to make your therapy better!    What to do if you need assistance after hours or on the weekends: CALL [phone].  HOLD on the line, do not hang up.  You will hear multiple messages but at the end you will be connected with a nurse triage line.  They will contact  the doctor if necessary.  Most of the time they will be able to assist you.  Do not call the hospital operator.      I have been informed and understand all of the instructions given to me and have received a copy. I have been instructed to call the clinic [phone] or my family physician as soon as possible for continued medical care, if indicated. I do not have any more questions at this time but understand that I may call the [facility] or the Patient Navigator at [phone] during office hours should I have questions or need assistance in obtaining follow-up care.

## 2020-03-15 ENCOUNTER — Ambulatory Visit (INDEPENDENT_AMBULATORY_CARE_PROVIDER_SITE_OTHER): Payer: Medicare Other | Admitting: General Surgery

## 2020-03-15 ENCOUNTER — Inpatient Hospital Stay (HOSPITAL_COMMUNITY): Payer: Medicare Other | Admitting: General Practice

## 2020-03-15 ENCOUNTER — Encounter: Payer: Self-pay | Admitting: General Surgery

## 2020-03-15 ENCOUNTER — Other Ambulatory Visit: Payer: Self-pay

## 2020-03-15 ENCOUNTER — Inpatient Hospital Stay (HOSPITAL_COMMUNITY): Payer: Medicare Other

## 2020-03-15 VITALS — BP 130/78 | HR 99 | Temp 97.8°F | Resp 14 | Ht 68.0 in | Wt 221.8 lb

## 2020-03-15 DIAGNOSIS — C9002 Multiple myeloma in relapse: Secondary | ICD-10-CM

## 2020-03-15 DIAGNOSIS — C9000 Multiple myeloma not having achieved remission: Secondary | ICD-10-CM | POA: Diagnosis not present

## 2020-03-15 DIAGNOSIS — Z85528 Personal history of other malignant neoplasm of kidney: Secondary | ICD-10-CM | POA: Diagnosis not present

## 2020-03-15 DIAGNOSIS — Z86718 Personal history of other venous thrombosis and embolism: Secondary | ICD-10-CM | POA: Diagnosis not present

## 2020-03-15 DIAGNOSIS — E1122 Type 2 diabetes mellitus with diabetic chronic kidney disease: Secondary | ICD-10-CM | POA: Diagnosis not present

## 2020-03-15 DIAGNOSIS — G4700 Insomnia, unspecified: Secondary | ICD-10-CM | POA: Diagnosis not present

## 2020-03-15 DIAGNOSIS — N184 Chronic kidney disease, stage 4 (severe): Secondary | ICD-10-CM | POA: Diagnosis not present

## 2020-03-15 LAB — COMPREHENSIVE METABOLIC PANEL
ALT: 28 U/L (ref 0–44)
AST: 17 U/L (ref 15–41)
Albumin: 4.1 g/dL (ref 3.5–5.0)
Alkaline Phosphatase: 71 U/L (ref 38–126)
Anion gap: 15 (ref 5–15)
BUN: 54 mg/dL — ABNORMAL HIGH (ref 8–23)
CO2: 22 mmol/L (ref 22–32)
Calcium: 9.5 mg/dL (ref 8.9–10.3)
Chloride: 102 mmol/L (ref 98–111)
Creatinine, Ser: 3.72 mg/dL — ABNORMAL HIGH (ref 0.61–1.24)
GFR calc Af Amer: 18 mL/min — ABNORMAL LOW (ref >60)
GFR calc non Af Amer: 16 mL/min — ABNORMAL LOW (ref >60)
Glucose, Bld: 275 mg/dL — ABNORMAL HIGH (ref 70–99)
Potassium: 4.1 mmol/L (ref 3.5–5.1)
Sodium: 139 mmol/L (ref 135–145)
Total Bilirubin: 1 mg/dL (ref 0.3–1.2)
Total Protein: 6.9 g/dL (ref 6.5–8.1)

## 2020-03-15 LAB — CBC
HCT: 28.1 % — ABNORMAL LOW (ref 39.0–52.0)
Hemoglobin: 9.4 g/dL — ABNORMAL LOW (ref 13.0–17.0)
MCH: 34.7 pg — ABNORMAL HIGH (ref 26.0–34.0)
MCHC: 33.5 g/dL (ref 30.0–36.0)
MCV: 103.7 fL — ABNORMAL HIGH (ref 80.0–100.0)
Platelets: 44 10*3/uL — ABNORMAL LOW (ref 150–400)
RBC: 2.71 MIL/uL — ABNORMAL LOW (ref 4.22–5.81)
RDW: 16.5 % — ABNORMAL HIGH (ref 11.5–15.5)
WBC: 3.9 10*3/uL — ABNORMAL LOW (ref 4.0–10.5)
nRBC: 0.5 % — ABNORMAL HIGH (ref 0.0–0.2)

## 2020-03-15 MED ORDER — LIDOCAINE-PRILOCAINE 2.5-2.5 % EX CREA
TOPICAL_CREAM | CUTANEOUS | 3 refills | Status: DC
Start: 2020-03-15 — End: 2020-03-17

## 2020-03-15 MED ORDER — PROCHLORPERAZINE MALEATE 10 MG PO TABS: 10.0000 mg | ORAL_TABLET | Freq: Four times a day (QID) | ORAL | 1 refills | Status: DC | PRN

## 2020-03-15 NOTE — Progress Notes (Signed)
Chemotherapy/immunotherapy education packet given and discussed with pt in detail.  Discussed diagnosis, staging, tx regimen, and intent of tx.  Reviewed chemotherapy/immunotherapy medications and side effects, as well as pre-medications.  Instructed on how to manage side effects at home, and when to call the clinic.  Importance of fever/chills discussed with pt. Discussed precautions to implement at home after receiving tx, as well as self care strategies. Phone numbers provided for clinic during regular working hours, also how to reach the clinic after hours and on weekends. Pt provided the opportunity to ask questions - all questions answered to pt's satisfaction.    

## 2020-03-15 NOTE — Progress Notes (Signed)
[facility] Initial Psychosocial Assessment Clinical Social Work  Clinical Social Work contacted by phone to assess psychosocial, emotional, mental health, and spiritual needs of the patient.   Barriers to care/review of distress screen:  - Transportation:  Do you anticipate any problems getting to appointments?  Do you have someone who can help run errands for you if you need it?  Drives himself to appointments.  Can ask sister if needed - Help at home:  What is your living situation (alone, family, other)?  If you are physically unable to care for yourself, who would you call on to help you?  Lives alone - sister lives up the road.   - Support system:  What does your support system look like?  Who would you call on if you needed some kind of practical help?  What if you needed someone to talk to for emotional support?  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  Has been told that they are standing by to assist as needed.  Father died from dementia in 2020-02-01.   - Finances:  Are you concerned about finances.  Considering returning to work?  If not, applying for disability?  "have good insurance and not behind on bills."  What is your understanding of where you are with your cancer? Its cause?  Your treatment plan and what happens next?  Has been out of treatment from myeloma for past two years.  Was in surveillance, Dr Raliegh Ip noticed change in blood work, ordered CT scan and bone biopsy.  "found it was acting up, not as bad as last time."  Will change Revlimid to "chemo pill" and weekly infusions.  What are your worries for the future as you begin treatment for cancer?  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  Had tingling in extremties on last chemo regimen, does not anticipate that this will be a problem w current change.  Does not limit walking during, does bother him at night.  Hands tingle during the day, may drop things/has lost feeling in his hands.  "I just make sure I  have a strong grip on it."  Has been informed of all possible side effects.  What are your hopes and priorities during your treatment? What is important to you? What are your goals for your care?  Hopeful that 8 weeks of treatment will bring levels down.  No other concerns.  Flies small private airplane but will not fly during treatments.  Has inside dog that he likes to care for.  "He is good company."    CSW Summary:  Patient and family psychosocial functioning including strengths, limitations, and coping skills:  69 yo male, being treated for myeloma for second time.  Last episode of treatment was in 31-Jan-2017 - he has been under surveillance since that time.  Glad that new treatment regimen will be different than what he had last time - he continues to struggle w neuropathy and hopes new treatment will not have that as a side effect.  Lives alone, but has good support from sister who lives nearby.  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  Has a positive attitude and has no concerns about upcoming treatments.    Identifications of barriers to care: none noted  Availability of community resources: none needed  Clinical Social Worker follow up  needed: No.   Edwyna Shell, [facility] Social Worker Phone:  [phone] Cell:  [phone]

## 2020-03-15 NOTE — Patient Instructions (Signed)
Hold Eliquis on 5/22, 5/23, and 5/24 before surgery on 5/24.  Implanted [facility] Guide An implanted port is a device that is placed under the skin. It is usually placed in the chest. The device can be used to give IV medicine, to take blood, or for dialysis. You may have an implanted port if:  You need IV medicine that would be irritating to the small veins in your hands or arms.  You need IV medicines, such as antibiotics, for a long period of time.  You need IV nutrition for a long period of time.  You need dialysis. Having a port means that your health care provider will not need to use the veins in your arms for these procedures. You may have fewer limitations when using a port than you would if you used other types of long-term IVs, and you will likely be able to return to normal activities after your incision heals. An implanted port has two main parts:  Reservoir. The reservoir is the part where a needle is inserted to give medicines or draw blood. The reservoir is round. After it is placed, it appears as a small, raised area under your skin.  Catheter. The catheter is a thin, flexible tube that connects the reservoir to a vein. Medicine that is inserted into the reservoir goes into the catheter and then into the vein. How is my port accessed? To access your port:  A numbing cream may be placed on the skin over the port site.  Your health care provider will put on a mask and sterile gloves.  The skin over your port will be cleaned carefully with a germ-killing soap and allowed to dry.  Your health care provider will gently pinch the port and insert a needle into it.  Your health care provider will check for a blood return to make sure the port is in the vein and is not clogged.  If your port needs to remain accessed to get medicine continuously (constant infusion), your health care provider will place a clear bandage (dressing) over the needle site. The dressing and needle will  need to be changed every week, or as told by your health care provider. What is flushing? Flushing helps keep the port from getting clogged. Follow instructions from your health care provider about how and when to flush the port. Ports are usually flushed with saline solution or a medicine called heparin. The need for flushing will depend on how the port is used:  If the port is only used from time to time to give medicines or draw blood, the port may need to be flushed: ? Before and after medicines have been given. ? Before and after blood has been drawn. ? As part of routine maintenance. Flushing may be recommended every 4-6 weeks.  If a constant infusion is running, the port may not need to be flushed.  Throw away any syringes in a disposal container that is meant for sharp items (sharps container). You can buy a sharps container from a pharmacy, or you can make one by using an empty hard plastic bottle with a cover. How long will my port stay implanted? The port can stay in for as long as your health care provider thinks it is needed. When it is time for the port to come out, a surgery will be done to remove it. The surgery will be similar to the procedure that was done to put the port in. Follow these instructions at home:  Flush your port as told by your health care provider.  If you need an infusion over several days, follow instructions from your health care provider about how to take care of your port site. Make sure you: ? Wash your hands with soap and water before you change your dressing. If soap and water are not available, use alcohol-based hand sanitizer. ? Change your dressing as told by your health care provider. ? Place any used dressings or infusion bags into a plastic bag. Throw that bag in the trash. ? Keep the dressing that covers the needle clean and dry. Do not get it wet. ? Do not use scissors or sharp objects near the tube. ? Keep the tube clamped, unless it is  being used.  Check your port site every day for signs of infection. Check for: ? Redness, swelling, or pain. ? Fluid or blood. ? Pus or a bad smell.  Protect the skin around the port site. ? Avoid wearing bra straps that rub or irritate the site. ? Protect the skin around your port from seat belts. Place a soft pad over your chest if needed.  Bathe or shower as told by your health care provider. The site may get wet as long as you are not actively receiving an infusion.  Return to your normal activities as told by your health care provider. Ask your health care provider what activities are safe for you.  Carry a medical alert card or wear a medical alert bracelet at all times. This will let health care providers know that you have an implanted port in case of an emergency. Get help right away if:  You have redness, swelling, or pain at the port site.  You have fluid or blood coming from your port site.  You have pus or a bad smell coming from the port site.  You have a fever. Summary  Implanted ports are usually placed in the chest for long-term IV access.  Follow instructions from your health care provider about flushing the port and changing bandages (dressings).  Take care of the area around your port by avoiding clothing that puts pressure on the area, and by watching for signs of infection.  Protect the skin around your port from seat belts. Place a soft pad over your chest if needed.  Get help right away if you have a fever or you have redness, swelling, pain, drainage, or a bad smell at the port site. This information is not intended to replace advice given to you by your health care provider. Make sure you discuss any questions you have with your health care provider. Document Revised: 02/06/2019 Document Reviewed: 11/17/2016 Elsevier Patient Education  2020 [employer].

## 2020-03-15 NOTE — Progress Notes (Signed)
[facility] History and Physical  Reason for Referral: Port placement  Referring Physician:  Dr. Delton Coombes  Chief Complaint    New Patient (Initial Visit)      Tom Marshall is a 69 y.o. male.  HPI: Tom Marshall is a 69 yo who has a history of multiple myeloma who was treated in the past and then recent lab work came back and has a relapse. He is going to need further treatment.  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 He has been on this for several years now.  He is wondering if he can come off of it. The patient says that aside from his lab work he has been feeling well and having no issues.  His lab work is positive for thrombocytopenia. He has numbness in his hands and feet from prior treatments he says.   Past Medical History:  Diagnosis Date  . Cancer ([place])    multiple myeloma  . Cancer of right kidney ([place])   . Cervical dystonia   . Diabetes mellitus without complication [facility])     Past Surgical History:  Procedure Laterality Date  . BONE MARROW BIOPSY    . CHOLECYSTECTOMY  2007  . COLONOSCOPY WITH PROPOFOL N/A 01/16/2018   Procedure: COLONOSCOPY WITH PROPOFOL;  Surgeon: Daneil Dolin, MD;  [facility];  Service: Endoscopy;  Laterality: N/A;  1:45pm  . ESOPHAGOGASTRODUODENOSCOPY (EGD) WITH PROPOFOL N/A 01/16/2018   Procedure: ESOPHAGOGASTRODUODENOSCOPY (EGD) WITH PROPOFOL;  Surgeon: Daneil Dolin, MD;  [facility];  Service: Endoscopy;  Laterality: N/A;  . GIVENS CAPSULE STUDY N/A 05/12/2018   Procedure: GIVENS CAPSULE STUDY;  Surgeon: Daneil Dolin, MD;  [facility];  Service: Endoscopy;  Laterality: N/A;  7:30am  . NEPHRECTOMY Right 1998   cancer    Family History  Problem Relation Age of Onset  . Heart failure Mother 38  . Dementia Father   . Colon cancer Neg Hx     Social History   Tobacco Use  . Smoking status:  Never Smoker  . Smokeless tobacco: Former Systems developer    Types: Chew  Substance Use Topics  . Alcohol use: No  . Drug use: No    Medications: I have reviewed the patient's current medications. Allergies as of 03/15/2020   No Known Allergies     Medication List       Accurate as of Mar 15, 2020 11:59 PM. If you have any questions, ask your nurse or doctor.        allopurinol 300 MG tablet Commonly known as: ZYLOPRIM Take 300 mg by mouth every morning.   B-D UF III MINI PEN NEEDLES 31G X 5 MM Misc Generic drug: Insulin Pen Needle   Centrum Silver 50+Men Tabs Take 1 tablet by mouth every morning.   cyclobenzaprine 10 MG tablet Commonly known as: FLEXERIL Take 10 mg by mouth as needed.   DARATUMUMAB IV Inject into the vein. Weeks 1-8 weekly x 8 doses; then every 2 weeks x 8 doses; then every 28 days Start taking on: Mar 22, 2020   diphenhydramine-acetaminophen 25-500 MG Tabs tablet Commonly known as: TYLENOL PM Take 1 tablet by mouth at bedtime as needed.   diphenoxylate-atropine 2.5-0.025 MG tablet Commonly known as: LOMOTIL   Eliquis 2.5 MG Tabs tablet Generic drug: apixaban TAKE 1 TABLET BY MOUTH TWICE  DAILY AFTER COMPLETION OF THE STARTER PACK. What changed: See the new instructions.   furosemide 40 MG tablet Commonly known as: LASIX Take 40 mg by mouth every Monday, Wednesday, and Friday.   gabapentin 300 MG capsule Commonly known as: NEURONTIN TAKE 1 CAPSULE BY MOUTH AT BEDTIME   glipiZIDE 5 MG 24 hr tablet Commonly known as: GLUCOTROL XL Take 5 mg by mouth daily with breakfast.   lenalidomide 2.5 MG capsule Commonly known as: Revlimid Take 1 capsule (2.5 mg total) by mouth daily.   lidocaine-prilocaine cream Commonly known as: EMLA Apply a small amount to port a cath site and cover with plastic wrap 1 hour prior to chemotherapy appointments   pravastatin 20 MG tablet Commonly known as: PRAVACHOL Take 20 mg by mouth at bedtime.   prochlorperazine 10  MG tablet Commonly known as: COMPAZINE Take 1 tablet (10 mg total) by mouth every 6 (six) hours as needed for nausea or vomiting.   sodium bicarbonate 650 MG tablet Take 1 tablet (650 mg total) by mouth 2 (two) times daily. What changed:   when to take this  additional instructions   traZODone 100 MG tablet Commonly known as: DESYREL Take 1 tablet (100 mg total) by mouth at bedtime.   Tyler Aas FlexTouch 200 UNIT/ML FlexTouch Pen Generic drug: insulin degludec Inject 40 Units into the skin daily.   True Metrix Blood Glucose Test test strip Generic drug: glucose blood        ROS:  A comprehensive review of systems was negative except for: Neurological: positive for numbness hands and feet Endocrine: positive for diabetes  Blood pressure 130/78, pulse 99, temperature 97.8 F (36.6 C), resp. rate 14, height _0  (1.727 m), weight 221 lb 12.8 oz (100.6 kg), SpO2 97 %. Physical Exam Vitals reviewed.  Constitutional:      Appearance: Normal appearance.  HENT:     Head: Normocephalic.     Nose: Nose normal.     Mouth/Throat:     Mouth: Mucous membranes are moist.  Eyes:     Extraocular Movements: Extraocular movements intact.     Pupils: Pupils are equal, round, and reactive to light.  Cardiovascular:     Rate and Rhythm: Normal rate and regular rhythm.  Pulmonary:     Effort: Pulmonary effort is normal.     Breath sounds: Normal breath sounds.  Abdominal:     General: There is no distension.     Palpations: Abdomen is soft.     Tenderness: There is no abdominal tenderness.  Musculoskeletal:        General: No swelling.     Cervical back: Normal range of motion.  Skin:    General: Skin is warm and dry.  Neurological:     General: No focal deficit present.     Mental Status: He is alert and oriented to person, place, and time.  Psychiatric:        Mood and Affect: Mood normal.        Behavior: Behavior normal.        Thought Content: Thought content normal.          Judgment: Judgment normal.     Results: CBC    Component Value Date/Time   WBC 3.9 (L) 03/15/2020 1130   RBC 2.71 (L) 03/15/2020 1130   HGB 9.4 (L) 03/15/2020 1130   HCT 28.1 (L) 03/15/2020 1130   PLT 44 (L) 03/15/2020 1130   MCV 103.7 (H) 03/15/2020 1130   MCH 34.7 (  H) 03/15/2020 1130   MCHC 33.5 03/15/2020 1130   RDW 16.5 (H) 03/15/2020 1130   LYMPHSABS 1.3 03/08/2020 1230   MONOABS 0.4 03/08/2020 1230   EOSABS 0.2 03/08/2020 1230   BASOSABS 0.1 03/08/2020 1230     Chemistry      Component Value Date/Time   NA 139 03/15/2020 1130   K 4.1 03/15/2020 1130   CL 102 03/15/2020 1130   CO2 22 03/15/2020 1130   BUN 54 (H) 03/15/2020 1130   CREATININE 3.72 (H) 03/15/2020 1130      Component Value Date/Time   CALCIUM 9.5 03/15/2020 1130   CALCIUM 10.7 (H) 07/25/2017 0447   ALKPHOS 71 03/15/2020 1130   AST 17 03/15/2020 1130   ALT 28 03/15/2020 1130   BILITOT 1.0 03/15/2020 1130      Assessment & Plan:  Tom Marshall is a 69 y.o. male with multiple myeloma that needs additional treatment. He is on Eliquis and also has thrombocytopenia. He has CKD also and this has been worsening.  He may ultimately need dialysis in the future.   -Labs repeated today (see above) and platelets are now below 50K, will plan for platelet infusion with port -Hold Eliquis 48 hours prior to surgery, and he was going to discuss with Dr. Delton Coombes if he needed to continue  -Discussed risk of port placement including bleeding, infection, pneumothorax, malfunction, and injury to vessels. Discussed that with his kidney disease we will spare his subclavian veins pending any need for dialysis via fistula in the future   All questions were answered to the satisfaction of the patient.    Virl Cagey 03/16/2020, 7:23 AM

## 2020-03-16 NOTE — H&P (Signed)
[facility] History and Physical  Reason for Referral: Port placement  Referring Physician:  Dr. Delton Coombes  Chief Complaint    New Patient (Initial Visit)      Tom Marshall is a 69 y.o. male.  HPI: Tom Marshall is a 69 yo who has a history of multiple myeloma who was treated in the past and then recent lab work came back and has a relapse. He is going to need further treatment.  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 He has been on this for several years now.  He is wondering if he can come off of it. The patient says that aside from his lab work he has been feeling well and having no issues.  His lab work is positive for thrombocytopenia. He has numbness in his hands and feet from prior treatments he says.   Past Medical History:  Diagnosis Date  . Cancer ([place])    multiple myeloma  . Cancer of right kidney ([place])   . Cervical dystonia   . Diabetes mellitus without complication [facility])     Past Surgical History:  Procedure Laterality Date  . BONE MARROW BIOPSY    . CHOLECYSTECTOMY  2007  . COLONOSCOPY WITH PROPOFOL N/A 01/16/2018   Procedure: COLONOSCOPY WITH PROPOFOL;  Surgeon: Daneil Dolin, MD;  [facility];  Service: Endoscopy;  Laterality: N/A;  1:45pm  . ESOPHAGOGASTRODUODENOSCOPY (EGD) WITH PROPOFOL N/A 01/16/2018   Procedure: ESOPHAGOGASTRODUODENOSCOPY (EGD) WITH PROPOFOL;  Surgeon: Daneil Dolin, MD;  [facility];  Service: Endoscopy;  Laterality: N/A;  . GIVENS CAPSULE STUDY N/A 05/12/2018   Procedure: GIVENS CAPSULE STUDY;  Surgeon: Daneil Dolin, MD;  [facility];  Service: Endoscopy;  Laterality: N/A;  7:30am  . NEPHRECTOMY Right 1998   cancer    Family History  Problem Relation Age of Onset  . Heart failure Mother 38  . Dementia Father   . Colon cancer Neg Hx     Social History   Tobacco Use  . Smoking status:  Never Smoker  . Smokeless tobacco: Former Systems developer    Types: Chew  Substance Use Topics  . Alcohol use: No  . Drug use: No    Medications: I have reviewed the patient's current medications. Allergies as of 03/15/2020   No Known Allergies     Medication List       Accurate as of Mar 15, 2020 11:59 PM. If you have any questions, ask your nurse or doctor.        allopurinol 300 MG tablet Commonly known as: ZYLOPRIM Take 300 mg by mouth every morning.   B-D UF III MINI PEN NEEDLES 31G X 5 MM Misc Generic drug: Insulin Pen Needle   Centrum Silver 50+Men Tabs Take 1 tablet by mouth every morning.   cyclobenzaprine 10 MG tablet Commonly known as: FLEXERIL Take 10 mg by mouth as needed.   DARATUMUMAB IV Inject into the vein. Weeks 1-8 weekly x 8 doses; then every 2 weeks x 8 doses; then every 28 days Start taking on: Mar 22, 2020   diphenhydramine-acetaminophen 25-500 MG Tabs tablet Commonly known as: TYLENOL PM Take 1 tablet by mouth at bedtime as needed.   diphenoxylate-atropine 2.5-0.025 MG tablet Commonly known as: LOMOTIL   Eliquis 2.5 MG Tabs tablet Generic drug: apixaban TAKE 1 TABLET BY MOUTH TWICE  DAILY AFTER COMPLETION OF THE STARTER PACK. What changed: See the new instructions.   furosemide 40 MG tablet Commonly known as: LASIX Take 40 mg by mouth every Monday, Wednesday, and Friday.   gabapentin 300 MG capsule Commonly known as: NEURONTIN TAKE 1 CAPSULE BY MOUTH AT BEDTIME   glipiZIDE 5 MG 24 hr tablet Commonly known as: GLUCOTROL XL Take 5 mg by mouth daily with breakfast.   lenalidomide 2.5 MG capsule Commonly known as: Revlimid Take 1 capsule (2.5 mg total) by mouth daily.   lidocaine-prilocaine cream Commonly known as: EMLA Apply a small amount to port a cath site and cover with plastic wrap 1 hour prior to chemotherapy appointments   pravastatin 20 MG tablet Commonly known as: PRAVACHOL Take 20 mg by mouth at bedtime.   prochlorperazine 10  MG tablet Commonly known as: COMPAZINE Take 1 tablet (10 mg total) by mouth every 6 (six) hours as needed for nausea or vomiting.   sodium bicarbonate 650 MG tablet Take 1 tablet (650 mg total) by mouth 2 (two) times daily. What changed:   when to take this  additional instructions   traZODone 100 MG tablet Commonly known as: DESYREL Take 1 tablet (100 mg total) by mouth at bedtime.   Tyler Aas FlexTouch 200 UNIT/ML FlexTouch Pen Generic drug: insulin degludec Inject 40 Units into the skin daily.   True Metrix Blood Glucose Test test strip Generic drug: glucose blood        ROS:  A comprehensive review of systems was negative except for: Neurological: positive for numbness hands and feet Endocrine: positive for diabetes  Blood pressure 130/78, pulse 99, temperature 97.8 F (36.6 C), resp. rate 14, height _0  (1.727 m), weight 221 lb 12.8 oz (100.6 kg), SpO2 97 %. Physical Exam Vitals reviewed.  Constitutional:      Appearance: Normal appearance.  HENT:     Head: Normocephalic.     Nose: Nose normal.     Mouth/Throat:     Mouth: Mucous membranes are moist.  Eyes:     Extraocular Movements: Extraocular movements intact.     Pupils: Pupils are equal, round, and reactive to light.  Cardiovascular:     Rate and Rhythm: Normal rate and regular rhythm.  Pulmonary:     Effort: Pulmonary effort is normal.     Breath sounds: Normal breath sounds.  Abdominal:     General: There is no distension.     Palpations: Abdomen is soft.     Tenderness: There is no abdominal tenderness.  Musculoskeletal:        General: No swelling.     Cervical back: Normal range of motion.  Skin:    General: Skin is warm and dry.  Neurological:     General: No focal deficit present.     Mental Status: He is alert and oriented to person, place, and time.  Psychiatric:        Mood and Affect: Mood normal.        Behavior: Behavior normal.        Thought Content: Thought content normal.          Judgment: Judgment normal.     Results: CBC    Component Value Date/Time   WBC 3.9 (L) 03/15/2020 1130   RBC 2.71 (L) 03/15/2020 1130   HGB 9.4 (L) 03/15/2020 1130   HCT 28.1 (L) 03/15/2020 1130   PLT 44 (L) 03/15/2020 1130   MCV 103.7 (H) 03/15/2020 1130   MCH 34.7 (  H) 03/15/2020 1130   MCHC 33.5 03/15/2020 1130   RDW 16.5 (H) 03/15/2020 1130   LYMPHSABS 1.3 03/08/2020 1230   MONOABS 0.4 03/08/2020 1230   EOSABS 0.2 03/08/2020 1230   BASOSABS 0.1 03/08/2020 1230     Chemistry      Component Value Date/Time   NA 139 03/15/2020 1130   K 4.1 03/15/2020 1130   CL 102 03/15/2020 1130   CO2 22 03/15/2020 1130   BUN 54 (H) 03/15/2020 1130   CREATININE 3.72 (H) 03/15/2020 1130      Component Value Date/Time   CALCIUM 9.5 03/15/2020 1130   CALCIUM 10.7 (H) 07/25/2017 0447   ALKPHOS 71 03/15/2020 1130   AST 17 03/15/2020 1130   ALT 28 03/15/2020 1130   BILITOT 1.0 03/15/2020 1130      Assessment & Plan:  Damen E Hearn is a 68 y.o. male with multiple myeloma that needs additional treatment. He is on Eliquis and also has thrombocytopenia. He has CKD also and this has been worsening.  He may ultimately need dialysis in the future.   -Labs repeated today (see above) and platelets are now below 50K, will plan for platelet infusion with port -Hold Eliquis 48 hours prior to surgery, and he was going to discuss with Dr. Katragadda if he needed to continue  -Discussed risk of port placement including bleeding, infection, pneumothorax, malfunction, and injury to vessels. Discussed that with his kidney disease we will spare his subclavian veins pending any need for dialysis via fistula in the future   All questions were answered to the satisfaction of the patient.    Ritchard Paragas C Bronc Brosseau 03/16/2020, 7:23 AM       

## 2020-03-17 ENCOUNTER — Other Ambulatory Visit (HOSPITAL_COMMUNITY): Payer: Self-pay | Admitting: *Deleted

## 2020-03-17 ENCOUNTER — Other Ambulatory Visit: Payer: Self-pay

## 2020-03-17 ENCOUNTER — Ambulatory Visit (HOSPITAL_COMMUNITY)
Admission: RE | Admit: 2020-03-17 | Discharge: 2020-03-17 | Disposition: A | Discharge status: Home / Self Care | Payer: Medicare Other | Source: Ambulatory Visit | Attending: Hematology | Admitting: Hematology

## 2020-03-17 ENCOUNTER — Other Ambulatory Visit (HOSPITAL_COMMUNITY): Payer: Self-pay | Admitting: Hematology

## 2020-03-17 ENCOUNTER — Encounter (HOSPITAL_COMMUNITY)
Admission: RE | Admit: 2020-03-17 | Discharge: 2020-03-17 | Disposition: A | Discharge status: Home / Self Care | Payer: Medicare Other | Source: Ambulatory Visit | Attending: Urology | Admitting: Urology

## 2020-03-17 DIAGNOSIS — C9000 Multiple myeloma not having achieved remission: Secondary | ICD-10-CM

## 2020-03-17 DIAGNOSIS — E119 Type 2 diabetes mellitus without complications: Secondary | ICD-10-CM | POA: Diagnosis not present

## 2020-03-17 DIAGNOSIS — Z7189 Other specified counseling: Secondary | ICD-10-CM | POA: Insufficient documentation

## 2020-03-17 MED ORDER — PROCHLORPERAZINE MALEATE 10 MG PO TABS: 10.0000 mg | ORAL_TABLET | Freq: Four times a day (QID) | ORAL | 1 refills | Status: DC | PRN

## 2020-03-17 MED ORDER — ACYCLOVIR 400 MG PO TABS: 400.0000 mg | ORAL_TABLET | Freq: Two times a day (BID) | ORAL | 11 refills | Status: DC

## 2020-03-17 MED ORDER — LIDOCAINE-PRILOCAINE 2.5-2.5 % EX CREA: TOPICAL_CREAM | CUTANEOUS | 3 refills | Status: DC

## 2020-03-17 MED ORDER — POMALIDOMIDE 3 MG PO CAPS: 3.0000 mg | ORAL_CAPSULE | Freq: Every day | ORAL | 0 refills | Status: DC

## 2020-03-17 NOTE — Progress Notes (Signed)
Pt. Is scheduled for COVID test 03/18/20.

## 2020-03-17 NOTE — Progress Notes (Signed)
START ON PATHWAY REGIMEN - Multiple Myeloma and Other Plasma Cell Dyscrasias     Cycles 1 and 2: A cycle is every 28 days:     Pomalidomide      Dexamethasone      Daratumumab    Cycles 3 through 6: A cycle is every 28 days:     Pomalidomide      Dexamethasone      Dexamethasone      Daratumumab    Cycles 7 and beyond: A cycle is every 28 days:     Pomalidomide      Dexamethasone      Dexamethasone      Daratumumab   **Always confirm dose/schedule in your pharmacy ordering system**  Patient Characteristics: Multiple Myeloma, Relapsed / Refractory, Second through Fourth Lines of Therapy Disease Classification: Multiple Myeloma R-ISS Staging: Unknown Therapeutic Status: Relapsed Line of Therapy: Second Line Intent of Therapy: Non-Curative / Palliative Intent, Discussed with Patient

## 2020-03-18 ENCOUNTER — Telehealth (HOSPITAL_COMMUNITY): Payer: Self-pay | Admitting: *Deleted

## 2020-03-18 ENCOUNTER — Other Ambulatory Visit (HOSPITAL_COMMUNITY)
Admission: RE | Admit: 2020-03-18 | Discharge: 2020-03-18 | Disposition: A | Discharge status: Home / Self Care | Payer: Medicare Other | Source: Ambulatory Visit | Attending: General Surgery | Admitting: General Surgery

## 2020-03-18 ENCOUNTER — Other Ambulatory Visit (HOSPITAL_COMMUNITY): Payer: Self-pay | Admitting: Nurse Practitioner

## 2020-03-18 ENCOUNTER — Ambulatory Visit
Admission: RE | Admit: 2020-03-18 | Discharge: 2020-03-18 | Disposition: A | Discharge status: Home / Self Care | Payer: Medicare Other | Source: Ambulatory Visit | Attending: Radiation Oncology | Admitting: Radiation Oncology

## 2020-03-18 DIAGNOSIS — C9000 Multiple myeloma not having achieved remission: Secondary | ICD-10-CM

## 2020-03-18 DIAGNOSIS — G9520 Unspecified cord compression: Secondary | ICD-10-CM | POA: Diagnosis not present

## 2020-03-18 DIAGNOSIS — C9002 Multiple myeloma in relapse: Secondary | ICD-10-CM

## 2020-03-18 DIAGNOSIS — Z01812 Encounter for preprocedural laboratory examination: Secondary | ICD-10-CM | POA: Diagnosis not present

## 2020-03-18 DIAGNOSIS — Z20822 Contact with and (suspected) exposure to covid-19: Secondary | ICD-10-CM | POA: Diagnosis not present

## 2020-03-18 DIAGNOSIS — C7951 Secondary malignant neoplasm of bone: Secondary | ICD-10-CM | POA: Insufficient documentation

## 2020-03-18 DIAGNOSIS — G893 Neoplasm related pain (acute) (chronic): Secondary | ICD-10-CM | POA: Diagnosis not present

## 2020-03-18 LAB — SARS CORONAVIRUS 2 (TAT 6-24 HRS): SARS Coronavirus 2: NEGATIVE

## 2020-03-18 MED ORDER — DEXAMETHASONE 4 MG PO TABS: 4.0000 mg | ORAL_TABLET | Freq: Two times a day (BID) | ORAL | 2 refills | Status: DC

## 2020-03-18 NOTE — Telephone Encounter (Signed)
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  Pt notified of MRI results and to be expecting a call from radiation oncology to schedule appointment to see them.  All of his questions were answered and he verbalizes understanding of all the above.

## 2020-03-18 NOTE — Progress Notes (Signed)
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     ICD-10-CM   1. Multiple myeloma in relapse (HCC)  C90.02   2. 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 male seen at the request of Dr. Delton Coombes.  He was initially diagnosed with multiple myeloma on 08/15/2017 and was treated under the care and direction of Dr. Delton Coombes with 4 cycles of RVD systemic therapy completed in April 2019.  He has been on maintenance therapy with Revlimid 2.5 mg since that time and had achieved complete remission.  More recently, labs performed on 02/09/2020 showed an M spike with increased light chains.  He underwent a bone marrow aspiration and biopsy on 02/23/2020 confirming a relapse of his multiple myeloma.  Around the same time, he developed some pain at the level of his right shoulder blade that was worse with movements and really not bothersome at rest.  A PET scan was performed on 02/26/2020 showing numerous areas of lytic changes in the spine as well as hypermetabolic activity within the ribs bilaterally.  For further evaluation, he underwent a thoracic spine MRI on 03/17/2020, without contrast secondary to his chronic kidney disease.  This scan showed diffuse multiple myeloma in the visible spine with bulky epidural extension of tumor on the right at T5, resulting in mild to moderate spinal cord compression and moderate to severe right T5 neural foraminal stenosis but  without associated cord signal abnormality.  Also noted was extraosseous tumor at the right T8 costovertebral junction and a chronic T4 pathologic compression fracture as well as widespread abnormal bilateral rib marrow signal.     Aside from the pain mentioned above, he denies any other focal pain, new paresthesias, weakness in the upper or lower extremities or changes in bowel and bladder function.  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  The current plan is to discontinue the maintenance therapy and change to a daratumumab-based regimen with DPD.  He is currently scheduled for port placement on Monday, 03/21/2020 with systemic therapy to begin on 03/22/2020.  Given the correlation with the noted disease at the level of his pain as well as indication of cord compression, we have been asked to consult the patient to discuss the potential role of palliative radiotherapy.  PREVIOUS RADIATION THERAPY: No  PAST MEDICAL HISTORY:  Past Medical History:  Diagnosis Date  . Cancer ([place])    multiple myeloma  . Cancer of right kidney ([place])   . Cervical dystonia   . Diabetes mellitus without complication ([place])       PAST SURGICAL HISTORY: Past Surgical History:  Procedure Laterality Date  . BONE MARROW BIOPSY    . CHOLECYSTECTOMY  2007  . COLONOSCOPY WITH PROPOFOL N/A 01/16/2018   Procedure: COLONOSCOPY WITH PROPOFOL;  Surgeon: Daneil Dolin, MD;  [facility];  Service: Endoscopy;  Laterality: N/A;  1:45pm  . ESOPHAGOGASTRODUODENOSCOPY (EGD) WITH PROPOFOL N/A 01/16/2018  Procedure: ESOPHAGOGASTRODUODENOSCOPY (EGD) WITH PROPOFOL;  Surgeon: Daneil Dolin, MD;  Location: [facility];  Service: Endoscopy;  Laterality: N/A;  . GIVENS CAPSULE STUDY N/A 05/12/2018   Procedure: GIVENS CAPSULE STUDY;  Surgeon: Daneil Dolin, MD;  Location: [facility];  Service: Endoscopy;  Laterality: N/A;  7:30am  . NEPHRECTOMY Right 1998    cancer    FAMILY HISTORY:  Family History  Problem Relation Age of Onset  . Heart failure Mother 66  . Dementia Father   . Colon cancer Neg Hx     SOCIAL HISTORY:  Social History   Socioeconomic History  . Marital status: Single    Spouse name: Not on file  . Number of children: Not on file  . Years of education: Not on file  . Highest education level: Not on file  Occupational History  . Occupation: Marine scientist, Insurance underwriter  Tobacco Use  . Smoking status: Never Smoker  . Smokeless tobacco: Former Systems developer    Types: Chew  Substance and Sexual Activity  . Alcohol use: No  . Drug use: No  . Sexual activity: Not Currently  Other Topics Concern  . Not on file  Social History Narrative  . Not on file   Social Determinants of Health   Financial Resource Strain:   . Difficulty of Paying Living Expenses:   Food Insecurity:   . Worried About Charity fundraiser in the Last Year:   . Arboriculturist in the Last Year:   Transportation Needs:   . Film/video editor (Medical):   [name] Lack of Transportation (Non-Medical):   Physical Activity:   . Days of Exercise per Week:   . Minutes of Exercise per Session:   Stress:   . Feeling of Stress :   Social Connections:   . Frequency of Communication with Friends and Family:   . Frequency of Social Gatherings with Friends and Family:   . Attends Religious Services:   . Active Member of Clubs or Organizations:   . Attends Archivist Meetings:   [name] Marital Status:   Intimate Partner Violence:   . Fear of Current or Ex-Partner:   . Emotionally Abused:   [name] Physically Abused:   . Sexually Abused:     ALLERGIES: Patient has no known allergies.  MEDICATIONS:  Current Outpatient Medications  Medication Sig Dispense Refill  . acyclovir (ZOVIRAX) 400 MG tablet Take 1 tablet (400 mg total) by mouth 2 (two) times daily. 60 tablet 11  . allopurinol (ZYLOPRIM) 300 MG tablet Take 300 mg by mouth every morning.    . B-D  UF III MINI PEN NEEDLES 31G X 5 MM MISC   4  . cyclobenzaprine (FLEXERIL) 10 MG tablet Take 10 mg by mouth 3 (three) times daily as needed for muscle spasms.     Derrill Memo ON 03/22/2020] DARATUMUMAB IV Inject into the vein. Weeks 1-8 weekly x 8 doses; then every 2 weeks x 8 doses; then every 28 days    . dexamethasone (DECADRON) 4 MG tablet Take 1 tablet (4 mg total) by mouth 2 (two) times daily with a meal. 30 tablet 2  . ELIQUIS 2.5 MG TABS tablet TAKE 1 TABLET BY MOUTH TWICE DAILY AFTER COMPLETION OF THE STARTER PACK. (Patient taking differently: Take 2.5 mg by mouth daily. ) 60 tablet 11  . furosemide (LASIX) 40 MG tablet Take 40 mg by mouth every Monday, Wednesday, and Friday.     . gabapentin (NEURONTIN)  300 MG capsule TAKE 1 CAPSULE BY MOUTH AT BEDTIME (Patient taking differently: Take 300 mg by mouth at bedtime. ) 30 capsule 1  . glipiZIDE (GLUCOTROL XL) 5 MG 24 hr tablet Take 5 mg by mouth daily with breakfast.     . lidocaine-prilocaine (EMLA) cream Apply small amount over port site 1 hour prior to appointment.  Cover with plastic wrap. 30 g 3  . Multiple Vitamins-Minerals (CENTRUM SILVER 50+MEN) TABS Take 1 tablet by mouth every morning.    . pomalidomide (POMALYST) 3 MG capsule Take 1 capsule (3 mg total) by mouth daily. Take with water on days 1-21. Repeat every 28 days. 21 capsule 0  . pravastatin (PRAVACHOL) 20 MG tablet Take 20 mg by mouth at bedtime.     . prochlorperazine (COMPAZINE) 10 MG tablet Take 1 tablet (10 mg total) by mouth every 6 (six) hours as needed (Nausea or vomiting). 30 tablet 1  . sodium bicarbonate 650 MG tablet Take 1 tablet (650 mg total) by mouth 2 (two) times daily. (Patient taking differently: Take 1,300 mg by mouth 2 (two) times daily. ) 60 tablet 1  . traZODone (DESYREL) 100 MG tablet Take 1 tablet (100 mg total) by mouth at bedtime. 30 tablet 3  . TRESIBA FLEXTOUCH 200 UNIT/ML SOPN Inject 44 Units into the skin daily.     . TRUE METRIX BLOOD GLUCOSE TEST  test strip      No current facility-administered medications for this encounter.   Facility-Administered Medications Ordered in Other Encounters  Medication Dose Route Frequency Provider Last Rate Last Admin  . 0.9 %  sodium chloride infusion  250 mL Intravenous Once Twana First, MD        REVIEW OF SYSTEMS:  On review of systems, the patient reports that he is doing well overall. He denies any chest pain, shortness of breath, cough, fevers, chills, night sweats, or unintended weight changes. He denies any bowel or bladder disturbances, and denies abdominal pain, nausea or vomiting. He denies any new musculoskeletal or joint aches or pains aside from the pain at the level of T5 as noted above.  No other areas of focal pain, new paresthesias, or weakness in the upper or lower extremities.  A complete review of systems is obtained and is otherwise negative.  PHYSICAL EXAM:  Wt Readings from Last 3 Encounters:  03/15/20 221 lb 12.8 oz (100.6 kg)  03/08/20 223 lb (101.2 kg)  02/23/20 218 lb (98.9 kg)   Temp Readings from Last 3 Encounters:  03/15/20 97.8 F (36.6 C)  03/08/20 (!) 96.9 F (36.1 C) (Temporal)  02/23/20 98.3 F (36.8 C) (Oral)   BP Readings from Last 3 Encounters:  03/15/20 130/78  03/08/20 (!) 141/70  02/23/20 (!) 113/58   Pulse Readings from Last 3 Encounters:  03/15/20 99  03/08/20 89  02/23/20 71    /10 Unable to assess due to telephone consult visit format.   KPS = 90  100 - Normal; no complaints; no evidence of disease. 90   - Able to carry on normal activity; minor signs or symptoms of disease. 80   - Normal activity with effort; some signs or symptoms of disease. 65   - Cares for self; unable to carry on normal activity or to do active work. 60   - Requires occasional assistance, but is able to care for most of his personal needs. 50   - Requires considerable assistance and frequent medical care. 55   - Disabled; requires special  care and  assistance. 55   - Severely disabled; hospital admission is indicated although death not imminent. 30   - Very sick; hospital admission necessary; active supportive treatment necessary. 10   - Moribund; fatal processes progressing rapidly. 0     - Dead  Karnofsky DA, Abelmann [place], Craver LS and Burchenal [facility] [phone]) The use of the nitrogen mustards in the palliative treatment of carcinoma: with particular reference to bronchogenic carcinoma Cancer 1 [id number]  LABORATORY DATA:  Lab Results  Component Value Date   WBC 3.9 (L) 03/15/2020   HGB 9.4 (L) 03/15/2020   HCT 28.1 (L) 03/15/2020   MCV 103.7 (H) 03/15/2020   PLT 44 (L) 03/15/2020   Lab Results  Component Value Date   NA 139 03/15/2020   K 4.1 03/15/2020   CL 102 03/15/2020   CO2 22 03/15/2020   Lab Results  Component Value Date   ALT 28 03/15/2020   AST 17 03/15/2020   ALKPHOS 71 03/15/2020   BILITOT 1.0 03/15/2020     RADIOGRAPHY: MR Thoracic Spine Wo Contrast  Result Date: 03/17/2020 CLINICAL DATA:  69 year old male with multiple myeloma. Right scapula pain. EXAM: MRI THORACIC SPINE WITHOUT CONTRAST TECHNIQUE: Multiplanar, multisequence MR imaging of the thoracic spine was performed. No intravenous contrast was administered. COMPARISON:  PET-CT 02/26/2020 FINDINGS: Limited cervical spine imaging: Diffusely abnormal cervical vertebral marrow signal (series 20). But no pathologic compression fracture is evident. There is evidence of degenerative cervical spinal stenosis at C3-C4. Thoracic spine segmentation:  Appears to be normal. Alignment: Moderate levoconvex upper thoracic scoliosis. Focal kyphosis about the T4 level, where there is also mild T4-T5 anterolisthesis. Vertebrae: Diffusely abnormal marrow signal throughout the thoracic spine. Chronic appearing pathologic compression fracture of T4 with anterior wedging. At the T5 vertebral level there is asymmetrically abnormal T2 and STIR hyperintensity in the right aspect of the  vertebral body which is contiguous with isointense abnormal soft tissue protruding into the right epidural space and right T5 neural foramen. See series 23, image 13, series 26, images 22 and 23, series 21, image 13. Subsequent spinal stenosis and mild to moderate cord compression. Associated moderate to severe right T5 neural foraminal stenosis. Similar confluent abnormal signal in the T8 posterior elements on the right (series 26, image 38 and series 21, image 11), T11 midline lamina (series 21, image 13). But no other epidural space involvement. However, there is also extraosseous extension of tumor at the right T8 costovertebral junction (series 24, image 18 and series 21, image 6). Associated widespread abnormal bilateral rib marrow signal. Cord: No spinal cord signal abnormality despite the mild to moderate cord compression at T5. No other malignant thoracic spinal stenosis. There is borderline to mild degenerative spinal stenosis at T10-T11 and part related to posterior element hypertrophy. Paraspinal and other soft tissues: Negative visible thoracic and upper abdominal viscera. Abnormal thoracic paraspinal soft tissues detailed above. Disc levels: Thoracic spine degeneration in the setting of levoconvex scoliosis, although no significant degenerative spinal stenosis at this time. IMPRESSION: 1. Diffuse multiple myeloma in the visible spine with bulky epidural extension of tumor on the right at T5 resulting in mild to moderate spinal cord compression and moderate to severe right T5 neural foraminal stenosis. However, there is no associated cord signal abnormality. 2. Extraosseous tumor also at the right T8 costovertebral junction. Chronic T4 pathologic compression fracture. 3. Evidence also of degenerative versus malignant cervical spinal stenosis at C3-C4. These results will be called to the ordering clinician or representative  by the Radiologist Assistant, and communication documented in the PACS or [employer]. Electronically Signed   By: Genevie Ann M.D.   On: 03/17/2020 22:47   NM PET Image Restage (PS) Whole Body  Result Date: 02/26/2020 CLINICAL DATA:  Subsequent treatment strategy for multiple myeloma. EXAM: NUCLEAR MEDICINE PET WHOLE BODY TECHNIQUE: 10.8 mCi F-18 FDG was injected intravenously. Full-ring PET imaging was performed from the skull base to thigh after the radiotracer. CT data was obtained and used for attenuation correction and anatomic localization. Fasting blood glucose: 138 mg/dl COMPARISON:  08/13/2017 FINDINGS: Mediastinal blood pool activity: SUV max 2.76 HEAD/NECK: Destructive lesion in the LEFT frontal bone with disruption of both inner and outer table of the calvarium soft tissue lesion in this area (SUVmax = 3.2 (image 20, series 4) new compared to previous PET exam from 2018 in terms of the bony destruction no increased metabolic activity was present in this area on the previous exam) multiple lytic foci throughout the calvarium that appear otherwise similar to previous imaging. Incidental CT findings: none CHEST: No hypermetabolic mediastinal or hilar nodes. No suspicious pulmonary nodules on the CT scan. Incidental CT findings: Heart size normal. No pericardial effusion. Three-vessel coronary artery calcification. Scattered aortic calcification. No aortic dilation. Normal caliber central pulmonary vasculature. Basilar atelectasis. ABDOMEN/PELVIS: No abnormal hypermetabolic activity within the liver, pancreas, adrenal glands, or spleen. No hypermetabolic lymph nodes in the abdomen or pelvis. Area colonic narrowing at the rectosigmoid junction may simply be under distension. However, there is suggestion of concomitant wall thickening and there is focal increased metabolic activity (SUVmax = 6.5. The finding is nonspecific though with similar uptake in this location on the prior study may warrant further evaluation, see below.) Incidental CT findings: Lobular hepatic contours. Spleen  is mildly enlarged. Post cholecystectomy. Splenic size similar to the prior study approximately 14 cm greatest axial dimension. Pancreas is normal. Adrenal glands are normal. Post RIGHT nephrectomy. Large cyst arising from lower pole LEFT kidney. Mild LEFT perinephric stranding. Urinary bladder is unremarkable. Focal area of under distension versus mild colonic narrowing. No signs of bowel obstruction. Small fat containing inguinal hernias. SKELETON: Lytic changes throughout the calvarium as described with new destructive lesion in the LEFT frontal bone also outlined above. Numerous areas lytic change in the spine. Lucent area in T6 with loss of height above at T5. Loss of height at T5 appears new from the previous imaging evaluation. T6 lucency is more pronounced (SUVmax = 3.7) Numerous areas of lytic change throughout the spine with otherwise similar appearance except for increased lucency noted along the pedicle and lamina and transverse process of the T8 vertebral level (image 101, series 4) uptake does not appear to be beyond that of other skeletal structures. The profound hypermetabolic activity that was seen a within ribs bilaterally, throughout the spine and associated with the LEFT scapula is no longer evident. For example, at the site of the sternal manubrium maximum SUV on today's study is 2.79, previously recorded at 6.7. Mildly increased metabolic activity noted along the anterior chest with rib fractures with chronic appearance. Diffuse marrow involvement there was seen on the prior study is markedly resolved. Heterogeneous marrow activity is noted, some areas associated with lytic changes outlined above with minimal hypermetabolic activity, some areas without lytic change display mildly heterogeneous appearance on FDG, for instance at the T11 level there is an SUV of 3.8 without focal lesion. Soft tissue lesion along the RIGHT anterior fourth rib measures 3.0 x 1.9 cm.  Not present previously in terms  of a discrete soft tissue lesion and displaying mild increased FDG uptake (SUVmax = 3.8) Resolution of scapular activity. LEFT-sided rib with expansile characteristics in soft tissue measuring 2.1 x 1.5 cm involving LEFT lateral eighth rib (image 115, series 4) (SUVmax = 3.4) Lytic lesion in the L4 vertebral body (image 161, series 4) little if any discrete FDG uptake associated with this area. Heterogeneity of uptake in the pelvis with greatest activity and RIGHT iliac bone (SUVmax = 3.5) Incidental CT findings: none EXTREMITIES: Femoral marrow uptake has resolved. Extremities without new signs of activity or without significant residual marrow uptake. Less apparent heterogeneity than is associated with the spine and pelvis. Incidental CT findings: none IMPRESSION: 1. Marked interval improvement with respect to marrow activity now with heterogeneous marrow activity is with nonspecific appearance. There are focal lytic areas in both the axial and appendicular skeleton associated with soft tissue showing low low level metabolic activity as described. 2. Focal colonic uptake is nonspecific particularly in light of normal colonoscopy in 2019. Correlate with any new symptoms. Could consider shorter interval follow-up for colonoscopy as clinically warranted. 3. Signs of liver disease.  Stable mild splenic enlargement. 4. Suggestion of T5 compression fracture since the previous imaging study, correlate with any pain in this area with dedicated spinal imaging as warranted. Spinal imaging may also be helpful given the increase in lucency in these areas in the mid and lower thoracic spine as discussed. Electronically Signed   By: Zetta Bills M.D.   On: 02/26/2020 11:38   CT Biopsy  Result Date: 02/23/2020 INDICATION: 69 year old with history of multiple myeloma and concern for recurrent disease. EXAM: CT GUIDED BONE MARROW ASPIRATES AND BIOPSY Physician: Stephan Minister. Anselm Pancoast, MD MEDICATIONS: None. ANESTHESIA/SEDATION:  Fentanyl 100 mcg IV; Versed 2.0 mg IV Moderate Sedation Time:  12 minutes The patient was continuously monitored during the procedure by the interventional radiology nurse under my direct supervision. COMPLICATIONS: None immediate. PROCEDURE: The procedure was explained to the patient. The risks and benefits of the procedure were discussed and the patient's questions were addressed. Informed consent was obtained from the patient. The patient was placed prone on CT table. Images of the pelvis were obtained. The right side of back was prepped and draped in sterile fashion. The skin and right posterior ilium were anesthetized with 1% lidocaine. 11 gauge bone needle was directed into the right ilium with CT guidance. Two aspirates and one core biopsy were obtained. Bandage placed over the puncture site. IMPRESSION: CT guided bone marrow aspiration and core biopsy. Electronically Signed   By: Markus Daft M.D.   On: 02/23/2020 12:46   CT BONE MARROW BIOPSY & ASPIRATION  Result Date: 02/23/2020 INDICATION: 69 year old with history of multiple myeloma and concern for recurrent disease. EXAM: CT GUIDED BONE MARROW ASPIRATES AND BIOPSY Physician: Stephan Minister. Anselm Pancoast, MD MEDICATIONS: None. ANESTHESIA/SEDATION: Fentanyl 100 mcg IV; Versed 2.0 mg IV Moderate Sedation Time:  12 minutes The patient was continuously monitored during the procedure by the interventional radiology nurse under my direct supervision. COMPLICATIONS: None immediate. PROCEDURE: The procedure was explained to the patient. The risks and benefits of the procedure were discussed and the patient's questions were addressed. Informed consent was obtained from the patient. The patient was placed prone on CT table. Images of the pelvis were obtained. The right side of back was prepped and draped in sterile fashion. The skin and right posterior ilium were anesthetized with 1% lidocaine. 11 gauge bone needle  was directed into the right ilium with CT guidance. Two  aspirates and one core biopsy were obtained. Bandage placed over the puncture site. IMPRESSION: CT guided bone marrow aspiration and core biopsy. Electronically Signed   By: Markus Daft M.D.   On: 02/23/2020 12:46      IMPRESSION/PLAN: This visit was conducted via telephone to spare the patient unnecessary potential exposure in the healthcare setting during the current COVID-19 pandemic.  1. 69 y.o. 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  Dr. Tammi Klippel has personally reviewed the patient's MRI images and formulated the treatment plan.  Today, we talked to the patient about the findings and workup thus far. We discussed the natural history of multiple myeloma and general treatment, highlighting the role of radiotherapy in the management of painful bony lesions. We discussed the available radiation techniques, and focused on the details and logistics of delivery.  The recommendation is for a 2-week course of daily palliative radiotherapy targeting the epidural disease at T5.  We reviewed the anticipated acute and late sequelae associated with radiation in this setting. The patient was encouraged to ask questions that were answered to his satisfaction.  At the conclusion of our conversation, the patient is in agreement to proceed with the recommended 2 week course of daily radiotherapy to treat the painful T5 lesion that is causing cord compression.  He has provided verbal consent to proceed today and will sign formal, written consent at the time of his CT simulation, scheduled for 1 PM on Monday, 03/21/2020.  He plans to start Decadron 35m po BID as prescribed today and will continue on this medication until completion of his radiation, at which time we will taper him off appropriately. Dr. KDelton Coombesis planning to postpone the start of systemic therapy until closer to the time of completing radiation, possibly  at completion of radiation.  The patient will proceed with port placement as scheduled at 8 AM on Monday, 03/21/2020.  This discussion will be shared with Dr. KDelton Coombes  We enjoyed meeting him today and look forward to continuing to participate in his care.  Given current concerns for patient exposure during the COVID-19 pandemic, this encounter was conducted via telephone. The patient was notified in advance and was [place] to allow for face to face communication but unfortunately reported that he/she did not have the appropriate resources/technology to support such a visit and instead preferred to proceed with telephone consult. The patient has given verbal consent for this type of encounter. The time spent during this encounter was 30 minutes. The attendants for this meeting include MTyler PitaMD, AAndi Devon and, patient, WMilderd Meager During the encounter, MTyler PitaMD and AFreeman CaldronPA-C, [facility]  Patient, WKiev Labrossewas located at home.    ANicholos Johns PA-C    MTyler Pita MD  [facility] Direct Dial: [phone] Fax: [phone]  Skype  LinkedIn

## 2020-03-18 NOTE — Patient Instructions (Signed)
Tom Marshall  03/18/2020     @PREFPERIOPPHARMACY @   Your procedure is scheduled on  03/21/2020 .  Report to [name] at  0700  A.M.  Call this number if you have problems the morning of surgery:  [phone]   Remember:  Do not eat or drink after midnight.                        Take these medicines the morning of surgery with A SIP OF WATER  Flexeril (if needed), gabapentin, compazine(if needed). No medications for diabetes the morning of your procedure/    Do not wear jewelry, make-up or nail polish.  Do not wear lotions, powders, or perfumes, or deodorant. Please brush your teeth.  Do not shave 48 hours prior to surgery.  Men may shave face and neck.  Do not bring valuables to the hospital.  [facility] is not responsible for any belongings or valuables.  Contacts, dentures or bridgework may not be worn into surgery.  Leave your suitcase in the car.  After surgery it may be brought to your room.  For patients admitted to the hospital, discharge time will be determined by your treatment team.  Patients discharged the day of surgery will not be allowed to drive home.   Name and phone number of your driver:   family Special instructions:  STOP ELIQUIS. Last dose to be 03/17/2020.  Please read over the following fact sheets that you were given. Anesthesia Post-op Instructions and Care and Recovery After Surgery       Implanted Port Insertion, Care After This sheet gives you information about how to care for yourself after your procedure. Your health care provider may also give you more specific instructions. If you have problems or questions, contact your health care provider. What can I expect after the procedure? After the procedure, it is common to have:  Discomfort at the port insertion site.  Bruising on the skin over the port. This should improve over 3-4 days. Follow these instructions at home: [facility] care  After your port is placed, you will  get a manufacturer's information card. The card has information about your port. Keep this card with you at all times.  Take care of the port as told by your health care provider. Ask your health care provider if you or a family member can get training for taking care of the port at home. A home health care nurse may also take care of the port.  Make sure to remember what type of port you have. Incision care      Follow instructions from your health care provider about how to take care of your port insertion site. Make sure you: ? Wash your hands with soap and water before and after you change your bandage (dressing). If soap and water are not available, use hand sanitizer. ? Change your dressing as told by your health care provider. ? Leave stitches (sutures), skin glue, or adhesive strips in place. These skin closures may need to stay in place for 2 weeks or longer. If adhesive strip edges start to loosen and curl up, you may trim the loose edges. Do not remove adhesive strips completely unless your health care provider tells you to do that.  Check your port insertion site every day for signs of infection. Check for: ? Redness, swelling, or pain. ? Fluid or blood. ? Warmth. ? Pus  or a bad smell. Activity  Return to your normal activities as told by your health care provider. Ask your health care provider what activities are safe for you.  Do not lift anything that is heavier than 10 lb (4.5 kg), or the limit that you are told, until your health care provider says that it is safe. General instructions  Take over-the-counter and prescription medicines only as told by your health care provider.  Do not take baths, swim, or use a hot tub until your health care provider approves. Ask your health care provider if you may take showers. You may only be allowed to take sponge baths.  Do not drive for 24 hours if you were given a sedative during your procedure.  Wear a medical alert bracelet  in case of an emergency. This will tell any health care providers that you have a port.  Keep all follow-up visits as told by your health care provider. This is important. Contact a health care provider if:  You cannot flush your port with saline as directed, or you cannot draw blood from the port.  You have a fever or chills.  You have redness, swelling, or pain around your port insertion site.  You have fluid or blood coming from your port insertion site.  Your port insertion site feels warm to the touch.  You have pus or a bad smell coming from the port insertion site. Get help right away if:  You have chest pain or shortness of breath.  You have bleeding from your port that you cannot control. Summary  Take care of the port as told by your health care provider. Keep the manufacturer's information card with you at all times.  Change your dressing as told by your health care provider.  Contact a health care provider if you have a fever or chills or if you have redness, swelling, or pain around your port insertion site.  Keep all follow-up visits as told by your health care provider. This information is not intended to replace advice given to you by your health care provider. Make sure you discuss any questions you have with your health care provider. Document Revised: 05/13/2018 Document Reviewed: 05/13/2018 Elsevier Patient Education  [place] An implanted port is a device that is placed under the skin. It is usually placed in the chest. The device can be used to give IV medicine, to take blood, or for dialysis. You may have an implanted port if:  You need IV medicine that would be irritating to the small veins in your hands or arms.  You need IV medicines, such as antibiotics, for a long period of time.  You need IV nutrition for a long period of time.  You need dialysis. Having a port means that your health care provider will not  need to use the veins in your arms for these procedures. You may have fewer limitations when using a port than you would if you used other types of long-term IVs, and you will likely be able to return to normal activities after your incision heals. An implanted port has two main parts:  Reservoir. The reservoir is the part where a needle is inserted to give medicines or draw blood. The reservoir is round. After it is placed, it appears as a small, raised area under your skin.  Catheter. The catheter is a thin, flexible tube that connects the reservoir to a vein. Medicine that is inserted into  the reservoir goes into the catheter and then into the vein. How is my port accessed? To access your port:  A numbing cream may be placed on the skin over the port site.  Your health care provider will put on a mask and sterile gloves.  The skin over your port will be cleaned carefully with a germ-killing soap and allowed to dry.  Your health care provider will gently pinch the port and insert a needle into it.  Your health care provider will check for a blood return to make sure the port is in the vein and is not clogged.  If your port needs to remain accessed to get medicine continuously (constant infusion), your health care provider will place a clear bandage (dressing) over the needle site. The dressing and needle will need to be changed every week, or as told by your health care provider. What is flushing? Flushing helps keep the port from getting clogged. Follow instructions from your health care provider about how and when to flush the port. Ports are usually flushed with saline solution or a medicine called heparin. The need for flushing will depend on how the port is used:  If the port is only used from time to time to give medicines or draw blood, the port may need to be flushed: ? Before and after medicines have been given. ? Before and after blood has been drawn. ? As part of routine  maintenance. Flushing may be recommended every 4-6 weeks.  If a constant infusion is running, the port may not need to be flushed.  Throw away any syringes in a disposal container that is meant for sharp items (sharps container). You can buy a sharps container from a pharmacy, or you can make one by using an empty hard plastic bottle with a cover. How long will my port stay implanted? The port can stay in for as long as your health care provider thinks it is needed. When it is time for the port to come out, a surgery will be done to remove it. The surgery will be similar to the procedure that was done to put the port in. Follow these instructions at home:   Flush your port as told by your health care provider.  If you need an infusion over several days, follow instructions from your health care provider about how to take care of your port site. Make sure you: ? Wash your hands with soap and water before you change your dressing. If soap and water are not available, use alcohol-based hand sanitizer. ? Change your dressing as told by your health care provider. ? Place any used dressings or infusion bags into a plastic bag. Throw that bag in the trash. ? Keep the dressing that covers the needle clean and dry. Do not get it wet. ? Do not use scissors or sharp objects near the tube. ? Keep the tube clamped, unless it is being used.  Check your port site every day for signs of infection. Check for: ? Redness, swelling, or pain. ? Fluid or blood. ? Pus or a bad smell.  Protect the skin around the port site. ? Avoid wearing bra straps that rub or irritate the site. ? Protect the skin around your port from seat belts. Place a soft pad over your chest if needed.  Bathe or shower as told by your health care provider. The site may get wet as long as you are not actively receiving an infusion.  Return to  your normal activities as told by your health care provider. Ask your health care provider what  activities are safe for you.  Carry a medical alert card or wear a medical alert bracelet at all times. This will let health care providers know that you have an implanted port in case of an emergency. Get help right away if:  You have redness, swelling, or pain at the port site.  You have fluid or blood coming from your port site.  You have pus or a bad smell coming from the port site.  You have a fever. Summary  Implanted ports are usually placed in the chest for long-term IV access.  Follow instructions from your health care provider about flushing the port and changing bandages (dressings).  Take care of the area around your port by avoiding clothing that puts pressure on the area, and by watching for signs of infection.  Protect the skin around your port from seat belts. Place a soft pad over your chest if needed.  Get help right away if you have a fever or you have redness, swelling, pain, drainage, or a bad smell at the port site. This information is not intended to replace advice given to you by your health care provider. Make sure you discuss any questions you have with your health care provider. Document Revised: 02/06/2019 Document Reviewed: 11/17/2016 Elsevier Patient Education  2020 [place] After These instructions provide you with information about caring for yourself after your procedure. Your health care provider may also give you more specific instructions. Your treatment has been planned according to current medical practices, but problems sometimes occur. Call your health care provider if you have any problems or questions after your procedure. What can I expect after the procedure? After your procedure, you may:  Feel sleepy for several hours.  Feel clumsy and have poor balance for several hours.  Feel forgetful about what happened after the procedure.  Have poor judgment for several hours.  Feel nauseous or  vomit.  Have a sore throat if you had a breathing tube during the procedure. Follow these instructions at home: For at least 24 hours after the procedure:      Have a responsible adult stay with you. It is important to have someone help care for you until you are awake and alert.  Rest as needed.  Do not: ? Participate in activities in which you could fall or become injured. ? Drive. ? Use heavy machinery. ? Drink alcohol. ? Take sleeping pills or medicines that cause drowsiness. ? Make important decisions or sign legal documents. ? Take care of children on your own. Eating and drinking  Follow the diet that is recommended by your health care provider.  If you vomit, drink water, juice, or soup when you can drink without vomiting.  Make sure you have little or no nausea before eating solid foods. General instructions  Take over-the-counter and prescription medicines only as told by your health care provider.  If you have sleep apnea, surgery and certain medicines can increase your risk for breathing problems. Follow instructions from your health care provider about wearing your sleep device: ? Anytime you are sleeping, including during daytime naps. ? While taking prescription pain medicines, sleeping medicines, or medicines that make you drowsy.  If you smoke, do not smoke without supervision.  Keep all follow-up visits as told by your health care provider. This is important. Contact a health care provider if:  You  keep feeling nauseous or you keep vomiting.  You feel light-headed.  You develop a rash.  You have a fever. Get help right away if:  You have trouble breathing. Summary  For several hours after your procedure, you may feel sleepy and have poor judgment.  Have a responsible adult stay with you for at least 24 hours or until you are awake and alert. This information is not intended to replace advice given to you by your health care provider. Make sure  you discuss any questions you have with your health care provider. Document Revised: 01/13/2018 Document Reviewed: 02/05/2016 Elsevier Patient Education  [place].

## 2020-03-21 ENCOUNTER — Other Ambulatory Visit: Payer: Self-pay

## 2020-03-21 ENCOUNTER — Encounter (HOSPITAL_COMMUNITY): Admission: RE | Payer: Self-pay | Source: Home / Self Care

## 2020-03-21 ENCOUNTER — Ambulatory Visit
Admission: RE | Admit: 2020-03-21 | Discharge: 2020-03-21 | Disposition: A | Discharge status: Home / Self Care | Payer: Medicare Other | Source: Ambulatory Visit | Attending: Radiation Oncology | Admitting: Radiation Oncology

## 2020-03-21 ENCOUNTER — Ambulatory Visit (HOSPITAL_COMMUNITY): Admission: RE | Admit: 2020-03-21 | Payer: Medicare Other | Source: Home / Self Care | Admitting: General Surgery

## 2020-03-21 DIAGNOSIS — Z510 Encounter for antineoplastic radiation therapy: Secondary | ICD-10-CM | POA: Insufficient documentation

## 2020-03-21 DIAGNOSIS — G9520 Unspecified cord compression: Secondary | ICD-10-CM

## 2020-03-21 DIAGNOSIS — C9000 Multiple myeloma not having achieved remission: Secondary | ICD-10-CM | POA: Diagnosis not present

## 2020-03-21 LAB — PREPARE PLATELET PHERESIS: Unit division: 0

## 2020-03-21 LAB — BPAM PLATELET PHERESIS
Blood Product Expiration Date: 202105262359
Unit Type and Rh: 5100

## 2020-03-21 SURGERY — INSERTION, TUNNELED CENTRAL VENOUS DEVICE, WITH PORT
Anesthesia: Monitor Anesthesia Care | Laterality: Left

## 2020-03-22 ENCOUNTER — Ambulatory Visit (HOSPITAL_COMMUNITY): Payer: Medicare Other

## 2020-03-22 ENCOUNTER — Ambulatory Visit
Admission: RE | Admit: 2020-03-22 | Discharge: 2020-03-22 | Disposition: A | Discharge status: Home / Self Care | Payer: Medicare Other | Source: Ambulatory Visit | Attending: Radiation Oncology | Admitting: Radiation Oncology

## 2020-03-22 ENCOUNTER — Ambulatory Visit (HOSPITAL_COMMUNITY): Payer: Medicare Other | Admitting: Hematology

## 2020-03-22 ENCOUNTER — Other Ambulatory Visit: Payer: Self-pay

## 2020-03-22 ENCOUNTER — Other Ambulatory Visit (HOSPITAL_COMMUNITY): Payer: Medicare Other

## 2020-03-22 DIAGNOSIS — Z510 Encounter for antineoplastic radiation therapy: Secondary | ICD-10-CM | POA: Diagnosis not present

## 2020-03-22 DIAGNOSIS — C9000 Multiple myeloma not having achieved remission: Secondary | ICD-10-CM | POA: Diagnosis not present

## 2020-03-23 ENCOUNTER — Ambulatory Visit (HOSPITAL_COMMUNITY): Payer: Medicare Other

## 2020-03-23 ENCOUNTER — Other Ambulatory Visit: Payer: Self-pay

## 2020-03-23 ENCOUNTER — Ambulatory Visit
Admission: RE | Admit: 2020-03-23 | Discharge: 2020-03-23 | Disposition: A | Discharge status: Home / Self Care | Payer: Medicare Other | Source: Ambulatory Visit | Attending: Radiation Oncology | Admitting: Radiation Oncology

## 2020-03-23 DIAGNOSIS — C9000 Multiple myeloma not having achieved remission: Secondary | ICD-10-CM | POA: Diagnosis not present

## 2020-03-23 DIAGNOSIS — Z510 Encounter for antineoplastic radiation therapy: Secondary | ICD-10-CM | POA: Diagnosis not present

## 2020-03-24 ENCOUNTER — Other Ambulatory Visit: Payer: Self-pay

## 2020-03-24 ENCOUNTER — Ambulatory Visit
Admission: RE | Admit: 2020-03-24 | Discharge: 2020-03-24 | Disposition: A | Discharge status: Home / Self Care | Payer: Medicare Other | Source: Ambulatory Visit | Attending: Radiation Oncology | Admitting: Radiation Oncology

## 2020-03-24 DIAGNOSIS — C9000 Multiple myeloma not having achieved remission: Secondary | ICD-10-CM | POA: Diagnosis not present

## 2020-03-24 DIAGNOSIS — Z510 Encounter for antineoplastic radiation therapy: Secondary | ICD-10-CM | POA: Diagnosis not present

## 2020-03-24 NOTE — H&P (Signed)
[facility] History and Physical  Reason for Referral: Port placement  Referring Physician: Dr. Delton Coombes     Chief Complaint     New Patient (Initial Visit)     Tom Marshall is a 69 y.o. male.  HPI: Tom Marshall is a 69 yo who has a history of multiple myeloma who was treated in the past and then recent lab work came back and has a relapse. He is going to need further treatment. 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 He has been on this for several years now. He is wondering if he can come off of it. The patient says that aside from his lab work he has been feeling well and having no issues. His lab work is positive for thrombocytopenia. He has numbness in his hands and feet from prior treatments he says.      Past Medical History:  Diagnosis Date  . Cancer ([place])    multiple myeloma  . Cancer of right kidney ([place])   . Cervical dystonia   . Diabetes mellitus without complication [facility])         Past Surgical History:  Procedure Laterality Date  . BONE MARROW BIOPSY    . CHOLECYSTECTOMY  2007  . COLONOSCOPY WITH PROPOFOL N/A 01/16/2018   Procedure: COLONOSCOPY WITH PROPOFOL; Surgeon: Daneil Dolin, MD; Location: [facility]; Service: Endoscopy; Laterality: N/A; 1:45pm  . ESOPHAGOGASTRODUODENOSCOPY (EGD) WITH PROPOFOL N/A 01/16/2018   Procedure: ESOPHAGOGASTRODUODENOSCOPY (EGD) WITH PROPOFOL; Surgeon: Daneil Dolin, MD; Location: [facility]; Service: Endoscopy; Laterality: N/A;  . GIVENS CAPSULE STUDY N/A 05/12/2018   Procedure: GIVENS CAPSULE STUDY; Surgeon: Daneil Dolin, MD; Location: [facility]; Service: Endoscopy; Laterality: N/A; 7:30am  . NEPHRECTOMY Right 1998   cancer        Family History  Problem Relation Age of Onset  . Heart failure Mother 48  . Dementia Father   . Colon cancer Neg Hx    Social History        Tobacco Use  . Smoking status:  Never Smoker  . Smokeless tobacco: Former Systems developer    Types: Chew  Substance Use Topics  . Alcohol use: No  . Drug use: No  Medications: I have reviewed the patient's current medications.  Allergies as of 03/15/2020   No Known Allergies          Medication List       Accurate as of Mar 15, 2020 11:59 PM. If you have any questions, ask your nurse or doctor.        allopurinol 300 MG tablet  Commonly known as: ZYLOPRIM  Take 300 mg by mouth every morning.   B-D UF III MINI PEN NEEDLES 31G X 5 MM Misc  Generic drug: Insulin Pen Needle   Centrum Silver 50+Men Tabs  Take 1 tablet by mouth every morning.   cyclobenzaprine 10 MG tablet  Commonly known as: FLEXERIL  Take 10 mg by mouth as needed.   DARATUMUMAB IV  Inject into the vein. Weeks 1-8 weekly x 8 doses; then every 2 weeks x 8 doses; then every 28 days  Start taking on: Mar 22, 2020   diphenhydramine-acetaminophen 25-500 MG Tabs tablet  Commonly known as: TYLENOL PM  Take 1 tablet by mouth at bedtime as needed.   diphenoxylate-atropine 2.5-0.025 MG tablet  Commonly known as: LOMOTIL  Eliquis 2.5 MG Tabs tablet  Generic drug: apixaban  TAKE 1 TABLET BY MOUTH TWICE DAILY AFTER COMPLETION OF THE STARTER PACK.  What changed: See the new instructions.   furosemide 40 MG tablet  Commonly known as: LASIX  Take 40 mg by mouth every Monday, Wednesday, and Friday.   gabapentin 300 MG capsule  Commonly known as: NEURONTIN  TAKE 1 CAPSULE BY MOUTH AT BEDTIME   glipiZIDE 5 MG 24 hr tablet  Commonly known as: GLUCOTROL XL  Take 5 mg by mouth daily with breakfast.   lenalidomide 2.5 MG capsule  Commonly known as: Revlimid  Take 1 capsule (2.5 mg total) by mouth daily.   lidocaine-prilocaine cream  Commonly known as: EMLA  Apply a small amount to port a cath site and cover with plastic wrap 1 hour prior to chemotherapy appointments   pravastatin 20 MG tablet  Commonly known as: PRAVACHOL  Take 20 mg by mouth at bedtime.     prochlorperazine 10 MG tablet  Commonly known as: COMPAZINE  Take 1 tablet (10 mg total) by mouth every 6 (six) hours as needed for nausea or vomiting.   sodium bicarbonate 650 MG tablet  Take 1 tablet (650 mg total) by mouth 2 (two) times daily.  What changed:  when to take this  additional instructions  traZODone 100 MG tablet  Commonly known as: DESYREL  Take 1 tablet (100 mg total) by mouth at bedtime.   Tyler Aas FlexTouch 200 UNIT/ML FlexTouch Pen  Generic drug: insulin degludec  Inject 40 Units into the skin daily.   True Metrix Blood Glucose Test test strip  Generic drug: glucose blood      ROS:  A comprehensive review of systems was negative except for: Neurological: positive for numbness hands and feet  Endocrine: positive for diabetes  Blood pressure 130/78, pulse 99, temperature 97.8 F (36.6 C), resp. rate 14, height _0  (1.727 m), weight 221 lb 12.8 oz (100.6 kg), SpO2 97 %.  Physical Exam  Vitals reviewed.  Constitutional:  Appearance: Normal appearance.  HENT:  Head: Normocephalic.  Nose: Nose normal.  Mouth/Throat:  Mouth: Mucous membranes are moist.  Eyes:  Extraocular Movements: Extraocular movements intact.  Pupils: Pupils are equal, round, and reactive to light.  Cardiovascular:  Rate and Rhythm: Normal rate and regular rhythm.  Pulmonary:  Effort: Pulmonary effort is normal.  Breath sounds: Normal breath sounds.  Abdominal:  General: There is no distension.  Palpations: Abdomen is soft.  Tenderness: There is no abdominal tenderness.  Musculoskeletal:  General: No swelling.  Cervical back: Normal range of motion.  Skin:  General: Skin is warm and dry.  Neurological:  General: No focal deficit present.  Mental Status: He is alert and oriented to person, place, and time.  Psychiatric:  Mood and Affect: Mood normal.  Behavior: Behavior normal.  Thought Content: Thought content normal.  Judgment: Judgment normal.  Results:  CBC          Component Value Date/Time   WBC 3.9 (L) 03/15/2020 1130   RBC 2.71 (L) 03/15/2020 1130   HGB 9.4 (L) 03/15/2020 1130   HCT 28.1 (L) 03/15/2020 1130   PLT 44 (L) 03/15/2020 1130   MCV 103.7 (H) 03/15/2020 1130   MCH 34.7 (H) 03/15/2020 1130   MCHC 33.5 03/15/2020 1130   RDW 16.5 (H) 03/15/2020 1130   LYMPHSABS 1.3 03/08/2020 1230   MONOABS 0.4 03/08/2020 1230   EOSABS 0.2 03/08/2020 1230   BASOSABS 0.1 03/08/2020 1230  Chemistry               Component Value Date/Time Component Value Date/Time   NA 139 03/15/2020 1130  CALCIUM 9.5 03/15/2020 1130   K 4.1 03/15/2020 1130  CALCIUM 10.7 (H) 07/25/2017 0447   CL 102 03/15/2020 1130  ALKPHOS 71 03/15/2020 1130   CO2 22 03/15/2020 1130  AST 17 03/15/2020 1130   BUN 54 (H) 03/15/2020 1130  ALT 28 03/15/2020 1130   CREATININE 3.72 (H) 03/15/2020 1130  BILITOT 1.0 03/15/2020 1130  Assessment & Plan:  SRIJAN GIVAN is a 69 y.o. male with multiple myeloma that needs additional treatment. He is on Eliquis and also has thrombocytopenia. He has CKD also and this has been worsening. He may ultimately need dialysis in the future.  -Labs repeated today (see above) and platelets are now below 50K, will plan for platelet infusion with port  -Hold Eliquis 48 hours prior to surgery, and he was going to discuss with Dr. Delton Coombes if he needed to continue  -Discussed risk of port placement including bleeding, infection, pneumothorax, malfunction, and injury to vessels. Discussed that with his kidney disease we will spare his subclavian veins pending any need for dialysis via fistula in the future  All questions were answered to the satisfaction of the patient.  Virl Cagey  03/16/2020, 7:23 AM

## 2020-03-25 ENCOUNTER — Ambulatory Visit
Admission: RE | Admit: 2020-03-25 | Discharge: 2020-03-25 | Disposition: A | Discharge status: Home / Self Care | Payer: Medicare Other | Source: Ambulatory Visit | Attending: Radiation Oncology | Admitting: Radiation Oncology

## 2020-03-25 DIAGNOSIS — C9000 Multiple myeloma not having achieved remission: Secondary | ICD-10-CM | POA: Diagnosis not present

## 2020-03-25 DIAGNOSIS — Z510 Encounter for antineoplastic radiation therapy: Secondary | ICD-10-CM | POA: Diagnosis not present

## 2020-03-26 DIAGNOSIS — G9520 Unspecified cord compression: Secondary | ICD-10-CM | POA: Insufficient documentation

## 2020-03-26 NOTE — Progress Notes (Signed)
  Radiation Oncology         [phone] ________________________________  Name: Tom Marshall MRN: [id number]  Date: 03/21/2020  DOB: Jun 10, 1951  SIMULATION AND TREATMENT PLANNING NOTE    ICD-10-CM   1. Multiple myeloma not having achieved remission (HCC)  C90.00   2. Spinal cord compression (HCC)  G95.20     DIAGNOSIS:  69 y.o. 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  Identity was confirmed.  All relevant records and images related to the planned course of therapy were reviewed.  The patient freely provided informed written consent to proceed with treatment after reviewing the details related to the planned course of therapy. The consent form was witnessed and verified by the simulation staff.  Then, the patient was set-up in a stable reproducible  supine position for radiation therapy.  CT images were obtained.  Surface markings were placed.  The CT images were loaded into the planning software.  Then the target and avoidance structures were contoured including lungs, heart, esophagus, spinal cord and target.  Treatment planning then occurred.  The radiation prescription was entered and confirmed.  Then, I designed and supervised the construction of a total of 3 medically necessary complex treatment devices with VacLoc positioner and 2 MLCs to shield the lungs.  I have requested : 3D Simulation  I have requested a DVH of the following structures: Left Lung, Right Lung and target.  PLAN:  The patient will receive 20 Gy in 10 fractions.  ________________________________  Sheral Apley Tammi Klippel, M.D.

## 2020-03-29 ENCOUNTER — Other Ambulatory Visit: Payer: Self-pay

## 2020-03-29 ENCOUNTER — Ambulatory Visit
Admission: RE | Admit: 2020-03-29 | Discharge: 2020-03-29 | Disposition: A | Discharge status: Home / Self Care | Payer: Medicare Other | Source: Ambulatory Visit | Attending: Radiation Oncology | Admitting: Radiation Oncology

## 2020-03-29 DIAGNOSIS — Z510 Encounter for antineoplastic radiation therapy: Secondary | ICD-10-CM | POA: Diagnosis not present

## 2020-03-29 DIAGNOSIS — C9000 Multiple myeloma not having achieved remission: Secondary | ICD-10-CM | POA: Insufficient documentation

## 2020-03-30 ENCOUNTER — Ambulatory Visit
Admission: RE | Admit: 2020-03-30 | Discharge: 2020-03-30 | Disposition: A | Discharge status: Home / Self Care | Payer: Medicare Other | Source: Ambulatory Visit | Attending: Radiation Oncology | Admitting: Radiation Oncology

## 2020-03-30 ENCOUNTER — Other Ambulatory Visit: Payer: Self-pay

## 2020-03-30 DIAGNOSIS — Z510 Encounter for antineoplastic radiation therapy: Secondary | ICD-10-CM | POA: Diagnosis not present

## 2020-03-31 ENCOUNTER — Ambulatory Visit
Admission: RE | Admit: 2020-03-31 | Discharge: 2020-03-31 | Disposition: A | Discharge status: Home / Self Care | Payer: Medicare Other | Source: Ambulatory Visit | Attending: Radiation Oncology | Admitting: Radiation Oncology

## 2020-03-31 DIAGNOSIS — Z510 Encounter for antineoplastic radiation therapy: Secondary | ICD-10-CM | POA: Diagnosis not present

## 2020-04-01 ENCOUNTER — Ambulatory Visit
Admission: RE | Admit: 2020-04-01 | Discharge: 2020-04-01 | Disposition: A | Discharge status: Home / Self Care | Payer: Medicare Other | Source: Ambulatory Visit | Attending: Radiation Oncology | Admitting: Radiation Oncology

## 2020-04-01 ENCOUNTER — Other Ambulatory Visit: Payer: Self-pay

## 2020-04-01 DIAGNOSIS — Z510 Encounter for antineoplastic radiation therapy: Secondary | ICD-10-CM | POA: Diagnosis not present

## 2020-04-01 NOTE — Patient Instructions (Signed)
Tom Marshall  04/01/2020     @PREFPERIOPPHARMACY @   Your procedure is scheduled on  04/07/2020  Report to [facility] at  0900  A.M.  Call this number if you have problems the morning of surgery:  [phone]   Remember:  Do not eat or drink after midnight.                         Take these medicines the morning of surgery with A SIP OF WATER  Zovirax, allopurinol, flexeril(if needed), compazine(if needed).    Do not wear jewelry, make-up or nail polish.  Do not wear lotions, powders, or perfumes or deodorant. Please brush your teeth.  Do not shave 48 hours prior to surgery.  Men may shave face and neck.  Do not bring valuables to the hospital.  [facility] is not responsible for any belongings or valuables.  Contacts, dentures or bridgework may not be worn into surgery.  Leave your suitcase in the car.  After surgery it may be brought to your room.  For patients admitted to the hospital, discharge time will be determined by your treatment team.  Patients discharged the day of surgery will not be allowed to drive home.   Name and phone number of your driver:   family Special instructions:  DO NOT smoke the morning of your procedure.  Please read over the following fact sheets that you were given. Anesthesia Post-op Instructions and Care and Recovery After Surgery       Implanted Port Insertion, Care After This sheet gives you information about how to care for yourself after your procedure. Your health care provider may also give you more specific instructions. If you have problems or questions, contact your health care provider. What can I expect after the procedure? After the procedure, it is common to have:  Discomfort at the port insertion site.  Bruising on the skin over the port. This should improve over 3-4 days. Follow these instructions at home: [facility] care  After your port is placed, you will get a manufacturer's information card. The card  has information about your port. Keep this card with you at all times.  Take care of the port as told by your health care provider. Ask your health care provider if you or a family member can get training for taking care of the port at home. A home health care nurse may also take care of the port.  Make sure to remember what type of port you have. Incision care      Follow instructions from your health care provider about how to take care of your port insertion site. Make sure you: ? Wash your hands with soap and water before and after you change your bandage (dressing). If soap and water are not available, use hand sanitizer. ? Change your dressing as told by your health care provider. ? Leave stitches (sutures), skin glue, or adhesive strips in place. These skin closures may need to stay in place for 2 weeks or longer. If adhesive strip edges start to loosen and curl up, you may trim the loose edges. Do not remove adhesive strips completely unless your health care provider tells you to do that.  Check your port insertion site every day for signs of infection. Check for: ? Redness, swelling, or pain. ? Fluid or blood. ? Warmth. ? Pus or a bad smell. Activity  Return to your normal activities as told by your health care provider. Ask your health care provider what activities are safe for you.  Do not lift anything that is heavier than 10 lb (4.5 kg), or the limit that you are told, until your health care provider says that it is safe. General instructions  Take over-the-counter and prescription medicines only as told by your health care provider.  Do not take baths, swim, or use a hot tub until your health care provider approves. Ask your health care provider if you may take showers. You may only be allowed to take sponge baths.  Do not drive for 24 hours if you were given a sedative during your procedure.  Wear a medical alert bracelet in case of an emergency. This will tell any  health care providers that you have a port.  Keep all follow-up visits as told by your health care provider. This is important. Contact a health care provider if:  You cannot flush your port with saline as directed, or you cannot draw blood from the port.  You have a fever or chills.  You have redness, swelling, or pain around your port insertion site.  You have fluid or blood coming from your port insertion site.  Your port insertion site feels warm to the touch.  You have pus or a bad smell coming from the port insertion site. Get help right away if:  You have chest pain or shortness of breath.  You have bleeding from your port that you cannot control. Summary  Take care of the port as told by your health care provider. Keep the manufacturer's information card with you at all times.  Change your dressing as told by your health care provider.  Contact a health care provider if you have a fever or chills or if you have redness, swelling, or pain around your port insertion site.  Keep all follow-up visits as told by your health care provider. This information is not intended to replace advice given to you by your health care provider. Make sure you discuss any questions you have with your health care provider. Document Revised: 05/13/2018 Document Reviewed: 05/13/2018 Elsevier Patient Education  [place] An implanted port is a device that is placed under the skin. It is usually placed in the chest. The device can be used to give IV medicine, to take blood, or for dialysis. You may have an implanted port if:  You need IV medicine that would be irritating to the small veins in your hands or arms.  You need IV medicines, such as antibiotics, for a long period of time.  You need IV nutrition for a long period of time.  You need dialysis. Having a port means that your health care provider will not need to use the veins in your arms for these  procedures. You may have fewer limitations when using a port than you would if you used other types of long-term IVs, and you will likely be able to return to normal activities after your incision heals. An implanted port has two main parts:  Reservoir. The reservoir is the part where a needle is inserted to give medicines or draw blood. The reservoir is round. After it is placed, it appears as a small, raised area under your skin.  Catheter. The catheter is a thin, flexible tube that connects the reservoir to a vein. Medicine that is inserted into the reservoir goes into the catheter  and then into the vein. How is my port accessed? To access your port:  A numbing cream may be placed on the skin over the port site.  Your health care provider will put on a mask and sterile gloves.  The skin over your port will be cleaned carefully with a germ-killing soap and allowed to dry.  Your health care provider will gently pinch the port and insert a needle into it.  Your health care provider will check for a blood return to make sure the port is in the vein and is not clogged.  If your port needs to remain accessed to get medicine continuously (constant infusion), your health care provider will place a clear bandage (dressing) over the needle site. The dressing and needle will need to be changed every week, or as told by your health care provider. What is flushing? Flushing helps keep the port from getting clogged. Follow instructions from your health care provider about how and when to flush the port. Ports are usually flushed with saline solution or a medicine called heparin. The need for flushing will depend on how the port is used:  If the port is only used from time to time to give medicines or draw blood, the port may need to be flushed: ? Before and after medicines have been given. ? Before and after blood has been drawn. ? As part of routine maintenance. Flushing may be recommended every 4-6  weeks.  If a constant infusion is running, the port may not need to be flushed.  Throw away any syringes in a disposal container that is meant for sharp items (sharps container). You can buy a sharps container from a pharmacy, or you can make one by using an empty hard plastic bottle with a cover. How long will my port stay implanted? The port can stay in for as long as your health care provider thinks it is needed. When it is time for the port to come out, a surgery will be done to remove it. The surgery will be similar to the procedure that was done to put the port in. Follow these instructions at home:   Flush your port as told by your health care provider.  If you need an infusion over several days, follow instructions from your health care provider about how to take care of your port site. Make sure you: ? Wash your hands with soap and water before you change your dressing. If soap and water are not available, use alcohol-based hand sanitizer. ? Change your dressing as told by your health care provider. ? Place any used dressings or infusion bags into a plastic bag. Throw that bag in the trash. ? Keep the dressing that covers the needle clean and dry. Do not get it wet. ? Do not use scissors or sharp objects near the tube. ? Keep the tube clamped, unless it is being used.  Check your port site every day for signs of infection. Check for: ? Redness, swelling, or pain. ? Fluid or blood. ? Pus or a bad smell.  Protect the skin around the port site. ? Avoid wearing bra straps that rub or irritate the site. ? Protect the skin around your port from seat belts. Place a soft pad over your chest if needed.  Bathe or shower as told by your health care provider. The site may get wet as long as you are not actively receiving an infusion.  Return to your normal activities as told by  your health care provider. Ask your health care provider what activities are safe for you.  Carry a medical  alert card or wear a medical alert bracelet at all times. This will let health care providers know that you have an implanted port in case of an emergency. Get help right away if:  You have redness, swelling, or pain at the port site.  You have fluid or blood coming from your port site.  You have pus or a bad smell coming from the port site.  You have a fever. Summary  Implanted ports are usually placed in the chest for long-term IV access.  Follow instructions from your health care provider about flushing the port and changing bandages (dressings).  Take care of the area around your port by avoiding clothing that puts pressure on the area, and by watching for signs of infection.  Protect the skin around your port from seat belts. Place a soft pad over your chest if needed.  Get help right away if you have a fever or you have redness, swelling, pain, drainage, or a bad smell at the port site. This information is not intended to replace advice given to you by your health care provider. Make sure you discuss any questions you have with your health care provider. Document Revised: 02/06/2019 Document Reviewed: 11/17/2016 Elsevier Patient Education  2020 [place] After These instructions provide you with information about caring for yourself after your procedure. Your health care provider may also give you more specific instructions. Your treatment has been planned according to current medical practices, but problems sometimes occur. Call your health care provider if you have any problems or questions after your procedure. What can I expect after the procedure? After your procedure, you may:  Feel sleepy for several hours.  Feel clumsy and have poor balance for several hours.  Feel forgetful about what happened after the procedure.  Have poor judgment for several hours.  Feel nauseous or vomit.  Have a sore throat if you had a breathing tube  during the procedure. Follow these instructions at home: For at least 24 hours after the procedure:      Have a responsible adult stay with you. It is important to have someone help care for you until you are awake and alert.  Rest as needed.  Do not: ? Participate in activities in which you could fall or become injured. ? Drive. ? Use heavy machinery. ? Drink alcohol. ? Take sleeping pills or medicines that cause drowsiness. ? Make important decisions or sign legal documents. ? Take care of children on your own. Eating and drinking  Follow the diet that is recommended by your health care provider.  If you vomit, drink water, juice, or soup when you can drink without vomiting.  Make sure you have little or no nausea before eating solid foods. General instructions  Take over-the-counter and prescription medicines only as told by your health care provider.  If you have sleep apnea, surgery and certain medicines can increase your risk for breathing problems. Follow instructions from your health care provider about wearing your sleep device: ? Anytime you are sleeping, including during daytime naps. ? While taking prescription pain medicines, sleeping medicines, or medicines that make you drowsy.  If you smoke, do not smoke without supervision.  Keep all follow-up visits as told by your health care provider. This is important. Contact a health care provider if:  You keep feeling nauseous or you  keep vomiting.  You feel light-headed.  You develop a rash.  You have a fever. Get help right away if:  You have trouble breathing. Summary  For several hours after your procedure, you may feel sleepy and have poor judgment.  Have a responsible adult stay with you for at least 24 hours or until you are awake and alert. This information is not intended to replace advice given to you by your health care provider. Make sure you discuss any questions you have with your health care  provider. Document Revised: 01/13/2018 Document Reviewed: 02/05/2016 Elsevier Patient Education  [place]. How to Use Chlorhexidine for Bathing Chlorhexidine gluconate (CHG) is a germ-killing (antiseptic) solution that is used to clean the skin. It can get rid of the bacteria that normally live on the skin and can keep them away for about 24 hours. To clean your skin with CHG, you may be given:  A CHG solution to use in the shower or as part of a sponge bath.  A prepackaged cloth that contains CHG. Cleaning your skin with CHG may help lower the risk for infection:  While you are staying in the intensive care unit of the hospital.  If you have a vascular access, such as a central line, to provide short-term or long-term access to your veins.  If you have a catheter to drain urine from your bladder.  If you are on a ventilator. A ventilator is a machine that helps you breathe by moving air in and out of your lungs.  After surgery. What are the risks? Risks of using CHG include:  A skin reaction.  Hearing loss, if CHG gets in your ears.  Eye injury, if CHG gets in your eyes and is not rinsed out.  The CHG product catching fire. Make sure that you avoid smoking and flames after applying CHG to your skin. Do not use CHG:  If you have a chlorhexidine allergy or have previously reacted to chlorhexidine.  On babies younger than 61 months of age. How to use CHG solution  Use CHG only as told by your health care provider, and follow the instructions on the label.  Use the full amount of CHG as directed. Usually, this is one bottle. During a shower Follow these steps when using CHG solution during a shower (unless your health care provider gives you different instructions): 1. Start the shower. 2. Use your normal soap and shampoo to wash your face and hair. 3. Turn off the shower or move out of the shower stream. 4. Pour the CHG onto a clean washcloth. Do not use any type  of brush or rough-edged sponge. 5. Starting at your neck, lather your body down to your toes. Make sure you follow these instructions: ? If you will be having surgery, pay special attention to the part of your body where you will be having surgery. Scrub this area for at least 1 minute. ? Do not use CHG on your head or face. If the solution gets into your ears or eyes, rinse them well with water. ? Avoid your genital area. ? Avoid any areas of skin that have broken skin, cuts, or scrapes. ? Scrub your back and under your arms. Make sure to wash skin folds. 6. Let the lather sit on your skin for 1-2 minutes or as long as told by your health care provider. 7. Thoroughly rinse your entire body in the shower. Make sure that all body creases and crevices are rinsed  well. 8. Dry off with a clean towel. Do not put any substances on your body afterward--such as powder, lotion, or perfume--unless you are told to do so by your health care provider. Only use lotions that are recommended by the manufacturer. 9. Put on clean clothes or pajamas. 10. If it is the night before your surgery, sleep in clean sheets.  During a sponge bath Follow these steps when using CHG solution during a sponge bath (unless your health care provider gives you different instructions): 1. Use your normal soap and shampoo to wash your face and hair. 2. Pour the CHG onto a clean washcloth. 3. Starting at your neck, lather your body down to your toes. Make sure you follow these instructions: ? If you will be having surgery, pay special attention to the part of your body where you will be having surgery. Scrub this area for at least 1 minute. ? Do not use CHG on your head or face. If the solution gets into your ears or eyes, rinse them well with water. ? Avoid your genital area. ? Avoid any areas of skin that have broken skin, cuts, or scrapes. ? Scrub your back and under your arms. Make sure to wash skin folds. 4. Let the lather sit  on your skin for 1-2 minutes or as long as told by your health care provider. 5. Using a different clean, wet washcloth, thoroughly rinse your entire body. Make sure that all body creases and crevices are rinsed well. 6. Dry off with a clean towel. Do not put any substances on your body afterward--such as powder, lotion, or perfume--unless you are told to do so by your health care provider. Only use lotions that are recommended by the manufacturer. 7. Put on clean clothes or pajamas. 8. If it is the night before your surgery, sleep in clean sheets. How to use CHG prepackaged cloths  Only use CHG cloths as told by your health care provider, and follow the instructions on the label.  Use the CHG cloth on clean, dry skin.  Do not use the CHG cloth on your head or face unless your health care provider tells you to.  When washing with the CHG cloth: ? Avoid your genital area. ? Avoid any areas of skin that have broken skin, cuts, or scrapes. Before surgery Follow these steps when using a CHG cloth to clean before surgery (unless your health care provider gives you different instructions): 1. Using the CHG cloth, vigorously scrub the part of your body where you will be having surgery. Scrub using a back-and-forth motion for 3 minutes. The area on your body should be completely wet with CHG when you are done scrubbing. 2. Do not rinse. Discard the cloth and let the area air-dry. Do not put any substances on the area afterward, such as powder, lotion, or perfume. 3. Put on clean clothes or pajamas. 4. If it is the night before your surgery, sleep in clean sheets.  For general bathing Follow these steps when using CHG cloths for general bathing (unless your health care provider gives you different instructions). 1. Use a separate CHG cloth for each area of your body. Make sure you wash between any folds of skin and between your fingers and toes. Wash your body in the following order, switching to a  new cloth after each step: ? The front of your neck, shoulders, and chest. ? Both of your arms, under your arms, and your hands. ? Your stomach and groin  area, avoiding the genitals. ? Your right leg and foot. ? Your left leg and foot. ? The back of your neck, your back, and your buttocks. 2. Do not rinse. Discard the cloth and let the area air-dry. Do not put any substances on your body afterward--such as powder, lotion, or perfume--unless you are told to do so by your health care provider. Only use lotions that are recommended by the manufacturer. 3. Put on clean clothes or pajamas. Contact a health care provider if:  Your skin gets irritated after scrubbing.  You have questions about using your solution or cloth. Get help right away if:  Your eyes become very red or swollen.  Your eyes itch badly.  Your skin itches badly and is red or swollen.  Your hearing changes.  You have trouble seeing.  You have swelling or tingling in your mouth or throat.  You have trouble breathing.  You swallow any chlorhexidine. Summary  Chlorhexidine gluconate (CHG) is a germ-killing (antiseptic) solution that is used to clean the skin. Cleaning your skin with CHG may help to lower your risk for infection.  You may be given CHG to use for bathing. It may be in a bottle or in a prepackaged cloth to use on your skin. Carefully follow your health care provider's instructions and the instructions on the product label.  Do not use CHG if you have a chlorhexidine allergy.  Contact your health care provider if your skin gets irritated after scrubbing. This information is not intended to replace advice given to you by your health care provider. Make sure you discuss any questions you have with your health care provider. Document Revised: 01/01/2019 Document Reviewed: 09/12/2017 Elsevier Patient Education  [place].

## 2020-04-02 LAB — TYPE AND SCREEN
ABO/RH(D): A POS
Antibody Screen: NEGATIVE
Unit division: 0

## 2020-04-02 LAB — BPAM RBC
Blood Product Expiration Date: 202106242359
Unit Type and Rh: 5100

## 2020-04-04 ENCOUNTER — Ambulatory Visit
Admission: RE | Admit: 2020-04-04 | Discharge: 2020-04-04 | Disposition: A | Discharge status: Home / Self Care | Payer: Medicare Other | Source: Ambulatory Visit | Attending: Radiation Oncology | Admitting: Radiation Oncology

## 2020-04-04 ENCOUNTER — Other Ambulatory Visit: Payer: Self-pay

## 2020-04-04 DIAGNOSIS — Z510 Encounter for antineoplastic radiation therapy: Secondary | ICD-10-CM | POA: Diagnosis not present

## 2020-04-05 ENCOUNTER — Encounter: Payer: Self-pay | Admitting: Radiation Oncology

## 2020-04-05 ENCOUNTER — Other Ambulatory Visit: Payer: Self-pay

## 2020-04-05 ENCOUNTER — Ambulatory Visit
Admission: RE | Admit: 2020-04-05 | Discharge: 2020-04-05 | Disposition: A | Discharge status: Home / Self Care | Payer: Medicare Other | Source: Ambulatory Visit | Attending: Radiation Oncology | Admitting: Radiation Oncology

## 2020-04-05 DIAGNOSIS — Z510 Encounter for antineoplastic radiation therapy: Secondary | ICD-10-CM | POA: Diagnosis not present

## 2020-04-06 ENCOUNTER — Encounter (HOSPITAL_COMMUNITY)
Admission: RE | Admit: 2020-04-06 | Discharge: 2020-04-06 | Disposition: A | Discharge status: Home / Self Care | Payer: Medicare Other | Source: Ambulatory Visit | Attending: General Surgery | Admitting: General Surgery

## 2020-04-06 ENCOUNTER — Ambulatory Visit (HOSPITAL_COMMUNITY): Payer: Medicare Other

## 2020-04-06 ENCOUNTER — Encounter (HOSPITAL_COMMUNITY): Payer: Self-pay

## 2020-04-06 ENCOUNTER — Other Ambulatory Visit (HOSPITAL_COMMUNITY): Payer: Medicare Other

## 2020-04-06 ENCOUNTER — Other Ambulatory Visit (HOSPITAL_COMMUNITY)
Admission: RE | Admit: 2020-04-06 | Discharge: 2020-04-06 | Disposition: A | Discharge status: Home / Self Care | Payer: Medicare Other | Source: Ambulatory Visit | Attending: General Surgery | Admitting: General Surgery

## 2020-04-06 ENCOUNTER — Other Ambulatory Visit: Payer: Self-pay

## 2020-04-06 ENCOUNTER — Ambulatory Visit (HOSPITAL_COMMUNITY): Payer: Medicare Other | Admitting: Hematology

## 2020-04-06 DIAGNOSIS — Z20822 Contact with and (suspected) exposure to covid-19: Secondary | ICD-10-CM | POA: Diagnosis not present

## 2020-04-06 DIAGNOSIS — Z01812 Encounter for preprocedural laboratory examination: Secondary | ICD-10-CM | POA: Insufficient documentation

## 2020-04-06 HISTORY — DX: Gout, unspecified: M10.9

## 2020-04-06 HISTORY — DX: Pure hypercholesterolemia, unspecified: E78.00

## 2020-04-06 LAB — CBC WITH DIFFERENTIAL/PLATELET
Abs Immature Granulocytes: 0.04 10*3/uL (ref 0.00–0.07)
Basophils Absolute: 0 10*3/uL (ref 0.0–0.1)
Basophils Relative: 0 %
Eosinophils Absolute: 0 10*3/uL (ref 0.0–0.5)
Eosinophils Relative: 1 %
HCT: 27.4 % — ABNORMAL LOW (ref 39.0–52.0)
Hemoglobin: 8.7 g/dL — ABNORMAL LOW (ref 13.0–17.0)
Immature Granulocytes: 1 %
Lymphocytes Relative: 21 %
Lymphs Abs: 0.6 10*3/uL — ABNORMAL LOW (ref 0.7–4.0)
MCH: 34.5 pg — ABNORMAL HIGH (ref 26.0–34.0)
MCHC: 31.8 g/dL (ref 30.0–36.0)
MCV: 108.7 fL — ABNORMAL HIGH (ref 80.0–100.0)
Monocytes Absolute: 0.3 10*3/uL (ref 0.1–1.0)
Monocytes Relative: 10 %
Neutro Abs: 2 10*3/uL (ref 1.7–7.7)
Neutrophils Relative %: 67 %
Platelets: 56 10*3/uL — ABNORMAL LOW (ref 150–400)
RBC: 2.52 MIL/uL — ABNORMAL LOW (ref 4.22–5.81)
RDW: 16.4 % — ABNORMAL HIGH (ref 11.5–15.5)
WBC: 3 10*3/uL — ABNORMAL LOW (ref 4.0–10.5)
nRBC: 1.3 % — ABNORMAL HIGH (ref 0.0–0.2)

## 2020-04-06 LAB — COMPREHENSIVE METABOLIC PANEL
ALT: 56 U/L — ABNORMAL HIGH (ref 0–44)
AST: 18 U/L (ref 15–41)
Albumin: 3.5 g/dL (ref 3.5–5.0)
Alkaline Phosphatase: 77 U/L (ref 38–126)
Anion gap: 13 (ref 5–15)
BUN: 65 mg/dL — ABNORMAL HIGH (ref 8–23)
CO2: 19 mmol/L — ABNORMAL LOW (ref 22–32)
Calcium: 8.2 mg/dL — ABNORMAL LOW (ref 8.9–10.3)
Chloride: 101 mmol/L (ref 98–111)
Creatinine, Ser: 3.02 mg/dL — ABNORMAL HIGH (ref 0.61–1.24)
GFR calc Af Amer: 23 mL/min — ABNORMAL LOW (ref >60)
GFR calc non Af Amer: 20 mL/min — ABNORMAL LOW (ref >60)
Glucose, Bld: 233 mg/dL — ABNORMAL HIGH (ref 70–99)
Potassium: 4.2 mmol/L (ref 3.5–5.1)
Sodium: 133 mmol/L — ABNORMAL LOW (ref 135–145)
Total Bilirubin: 0.8 mg/dL (ref 0.3–1.2)
Total Protein: 5.8 g/dL — ABNORMAL LOW (ref 6.5–8.1)

## 2020-04-06 LAB — TYPE AND SCREEN
ABO/RH(D): A POS
Antibody Screen: NEGATIVE

## 2020-04-06 LAB — SARS CORONAVIRUS 2 (TAT 6-24 HRS): SARS Coronavirus 2: NEGATIVE

## 2020-04-06 LAB — HEMOGLOBIN A1C
Hgb A1c MFr Bld: 8.6 % — ABNORMAL HIGH (ref 4.8–5.6)
Mean Plasma Glucose: 200.12 mg/dL

## 2020-04-06 NOTE — Pre-Procedure Instructions (Signed)
Platelets are 56. Dr Constance Haw notified and since they are greater than 50, pt does not need platelets before port insertion. Sherry in blood bank notified.

## 2020-04-06 NOTE — Progress Notes (Signed)
.   Pharmacist Chemotherapy Monitoring - Initial Assessment    Anticipated start date: 04/11/20   Regimen:  . Are orders appropriate based on the patient's diagnosis, regimen, and cycle? Yes . Does the plan date match the patient's scheduled date? Yes . Is the sequencing of drugs appropriate? Yes . Are the premedications appropriate for the patient's regimen? Yes . Prior Authorization for treatment is: Approved o If applicable, is the correct biosimilar selected based on the patient's insurance? not applicable  Organ Function and Labs: [name] Are dose adjustments needed based on the patient's renal function, hepatic function, or hematologic function? No . Are appropriate labs ordered prior to the start of patient's treatment? Yes . Other organ system assessment, if indicated: N/A . The following baseline labs, if indicated, have been ordered: daratumumab: blood type and screen  Dose Assessment: . Are the drug doses appropriate? Yes . Are the following correct: o Drug concentrations Yes o IV fluid compatible with drug Yes o Administration routes Yes o Timing of therapy Yes . If applicable, does the patient have documented access for treatment and/or plans for port-a-cath placement? yes . If applicable, have lifetime cumulative doses been properly documented and assessed? not applicable Lifetime Dose Tracking  No doses have been documented on this patient for the following tracked chemicals: Doxorubicin, Epirubicin, Idarubicin, Daunorubicin, Mitoxantrone, Bleomycin, Oxaliplatin, Carboplatin, Liposomal Doxorubicin  o   Toxicity Monitoring/Prevention: . The patient has the following take home antiemetics prescribed: Prochlorperazine . The patient has the following take home medications prescribed: antimicrobial prophylaxis . Medication allergies and previous infusion related reactions, if applicable, have been reviewed and addressed. Yes . The patient's current medication list has been  assessed for drug-drug interactions with their chemotherapy regimen. no significant drug-drug interactions were identified on review.  Order Review: . Are the treatment plan orders signed? No . Is the patient scheduled to see a provider prior to their treatment? Yes  I verify that I have reviewed each item in the above checklist and answered each question accordingly.  Wynona Neat 04/06/2020 3:30 PM

## 2020-04-07 ENCOUNTER — Encounter (HOSPITAL_COMMUNITY)
Admission: RE | Disposition: A | Discharge status: Home / Self Care | Payer: Self-pay | Source: Home / Self Care | Attending: General Surgery

## 2020-04-07 ENCOUNTER — Ambulatory Visit (HOSPITAL_COMMUNITY): Payer: Medicare Other | Admitting: Anesthesiology

## 2020-04-07 ENCOUNTER — Ambulatory Visit (HOSPITAL_COMMUNITY)
Admission: RE | Admit: 2020-04-07 | Discharge: 2020-04-07 | Disposition: A | Discharge status: Home / Self Care | Payer: Medicare Other | Attending: General Surgery | Admitting: General Surgery

## 2020-04-07 ENCOUNTER — Ambulatory Visit (HOSPITAL_COMMUNITY): Payer: Medicare Other

## 2020-04-07 ENCOUNTER — Encounter (HOSPITAL_COMMUNITY): Payer: Self-pay | Admitting: General Surgery

## 2020-04-07 DIAGNOSIS — D696 Thrombocytopenia, unspecified: Secondary | ICD-10-CM | POA: Insufficient documentation

## 2020-04-07 DIAGNOSIS — N189 Chronic kidney disease, unspecified: Secondary | ICD-10-CM | POA: Insufficient documentation

## 2020-04-07 DIAGNOSIS — Z9049 Acquired absence of other specified parts of digestive tract: Secondary | ICD-10-CM | POA: Insufficient documentation

## 2020-04-07 DIAGNOSIS — Z8249 Family history of ischemic heart disease and other diseases of the circulatory system: Secondary | ICD-10-CM | POA: Diagnosis not present

## 2020-04-07 DIAGNOSIS — Z87891 Personal history of nicotine dependence: Secondary | ICD-10-CM | POA: Insufficient documentation

## 2020-04-07 DIAGNOSIS — Z86718 Personal history of other venous thrombosis and embolism: Secondary | ICD-10-CM | POA: Insufficient documentation

## 2020-04-07 DIAGNOSIS — R9431 Abnormal electrocardiogram [ECG] [EKG]: Secondary | ICD-10-CM | POA: Diagnosis not present

## 2020-04-07 DIAGNOSIS — E1122 Type 2 diabetes mellitus with diabetic chronic kidney disease: Secondary | ICD-10-CM | POA: Diagnosis not present

## 2020-04-07 DIAGNOSIS — C9000 Multiple myeloma not having achieved remission: Secondary | ICD-10-CM

## 2020-04-07 DIAGNOSIS — C9002 Multiple myeloma in relapse: Secondary | ICD-10-CM | POA: Diagnosis not present

## 2020-04-07 DIAGNOSIS — Z820 Family history of epilepsy and other diseases of the nervous system: Secondary | ICD-10-CM | POA: Insufficient documentation

## 2020-04-07 DIAGNOSIS — Z452 Encounter for adjustment and management of vascular access device: Secondary | ICD-10-CM | POA: Diagnosis not present

## 2020-04-07 DIAGNOSIS — Z79899 Other long term (current) drug therapy: Secondary | ICD-10-CM | POA: Diagnosis not present

## 2020-04-07 DIAGNOSIS — Z905 Acquired absence of kidney: Secondary | ICD-10-CM | POA: Diagnosis not present

## 2020-04-07 DIAGNOSIS — Z794 Long term (current) use of insulin: Secondary | ICD-10-CM | POA: Insufficient documentation

## 2020-04-07 DIAGNOSIS — Z85528 Personal history of other malignant neoplasm of kidney: Secondary | ICD-10-CM | POA: Insufficient documentation

## 2020-04-07 DIAGNOSIS — Z95828 Presence of other vascular implants and grafts: Secondary | ICD-10-CM

## 2020-04-07 DIAGNOSIS — Z7901 Long term (current) use of anticoagulants: Secondary | ICD-10-CM | POA: Diagnosis not present

## 2020-04-07 DIAGNOSIS — J9811 Atelectasis: Secondary | ICD-10-CM | POA: Diagnosis not present

## 2020-04-07 HISTORY — PX: PORTACATH PLACEMENT: SHX2246

## 2020-04-07 LAB — GLUCOSE, CAPILLARY
Glucose-Capillary: 107 mg/dL — ABNORMAL HIGH (ref 70–99)
Glucose-Capillary: 116 mg/dL — ABNORMAL HIGH (ref 70–99)

## 2020-04-07 SURGERY — INSERTION, TUNNELED CENTRAL VENOUS DEVICE, WITH PORT
Anesthesia: General | Site: Chest | Laterality: Right

## 2020-04-07 MED ORDER — PROPOFOL 10 MG/ML IV BOLUS
INTRAVENOUS | Status: DC | PRN
  Administered 2020-04-07: 200 mg via INTRAVENOUS

## 2020-04-07 MED ORDER — LACTATED RINGERS IV SOLN
Freq: Once | INTRAVENOUS | Status: AC
  Administered 2020-04-07: 1000 mL via INTRAVENOUS

## 2020-04-07 MED ORDER — HYDROMORPHONE HCL 1 MG/ML IJ SOLN
0.2500 mg | INTRAMUSCULAR | Status: DC | PRN
  Administered 2020-04-07: 0.5 mg via INTRAVENOUS
  Filled 2020-04-07: qty 0.5

## 2020-04-07 MED ORDER — KETAMINE HCL 50 MG/5ML IJ SOSY
PREFILLED_SYRINGE | INTRAMUSCULAR | Status: AC
  Filled 2020-04-07: qty 5

## 2020-04-07 MED ORDER — CHLORHEXIDINE GLUCONATE 0.12 % MT SOLN
15.0000 mL | Freq: Once | OROMUCOSAL | Status: AC
  Administered 2020-04-07: 15 mL via OROMUCOSAL
  Filled 2020-04-07: qty 15

## 2020-04-07 MED ORDER — LIDOCAINE HCL (PF) 1 % IJ SOLN
INTRAMUSCULAR | Status: AC
  Filled 2020-04-07: qty 30

## 2020-04-07 MED ORDER — LACTATED RINGERS IV SOLN
INTRAVENOUS | Status: DC | PRN
Start: 2020-04-07 — End: 2020-04-07

## 2020-04-07 MED ORDER — PROPOFOL 10 MG/ML IV BOLUS
INTRAVENOUS | Status: AC
  Filled 2020-04-07: qty 40

## 2020-04-07 MED ORDER — MIDAZOLAM HCL 2 MG/2ML IJ SOLN
INTRAMUSCULAR | Status: AC
  Filled 2020-04-07: qty 2

## 2020-04-07 MED ORDER — ONDANSETRON HCL 4 MG/2ML IJ SOLN
INTRAMUSCULAR | Status: DC | PRN
  Administered 2020-04-07: 4 mg via INTRAVENOUS

## 2020-04-07 MED ORDER — LIDOCAINE HCL (CARDIAC) PF 100 MG/5ML IV SOSY
PREFILLED_SYRINGE | INTRAVENOUS | Status: DC | PRN
  Administered 2020-04-07: 100 mg via INTRAVENOUS

## 2020-04-07 MED ORDER — HEPARIN SOD (PORK) LOCK FLUSH 100 UNIT/ML IV SOLN
INTRAVENOUS | Status: AC
  Filled 2020-04-07: qty 5

## 2020-04-07 MED ORDER — HEPARIN SOD (PORK) LOCK FLUSH 100 UNIT/ML IV SOLN
INTRAVENOUS | Status: DC | PRN
  Administered 2020-04-07: 500 [IU] via INTRAVENOUS

## 2020-04-07 MED ORDER — CHLORHEXIDINE GLUCONATE CLOTH 2 % EX PADS: 6.0000 | MEDICATED_PAD | Freq: Once | CUTANEOUS | Status: AC

## 2020-04-07 MED ORDER — ONDANSETRON HCL 4 MG/2ML IJ SOLN
INTRAMUSCULAR | Status: AC
  Filled 2020-04-07: qty 2

## 2020-04-07 MED ORDER — HYDROCODONE-ACETAMINOPHEN 5-325 MG PO TABS: 1.0000 | ORAL_TABLET | ORAL | 0 refills | Status: DC | PRN

## 2020-04-07 MED ORDER — MIDAZOLAM HCL 5 MG/5ML IJ SOLN
INTRAMUSCULAR | Status: DC | PRN
  Administered 2020-04-07: 1 mg via INTRAVENOUS

## 2020-04-07 MED ORDER — DEXAMETHASONE SODIUM PHOSPHATE 10 MG/ML IJ SOLN
INTRAMUSCULAR | Status: AC
  Filled 2020-04-07: qty 1

## 2020-04-07 MED ORDER — SODIUM CHLORIDE (PF) 0.9 % IJ SOLN
INTRAMUSCULAR | Status: DC | PRN
  Administered 2020-04-07: 500 mL via INTRAVENOUS

## 2020-04-07 MED ORDER — ONDANSETRON HCL 4 MG/2ML IJ SOLN: 4.0000 mg | Freq: Once | INTRAMUSCULAR | Status: DC | PRN

## 2020-04-07 MED ORDER — FENTANYL CITRATE (PF) 100 MCG/2ML IJ SOLN
INTRAMUSCULAR | Status: AC
  Filled 2020-04-07: qty 2

## 2020-04-07 MED ORDER — MEPERIDINE HCL 50 MG/ML IJ SOLN: 6.2500 mg | INTRAMUSCULAR | Status: DC | PRN

## 2020-04-07 MED ORDER — FENTANYL CITRATE (PF) 100 MCG/2ML IJ SOLN
INTRAMUSCULAR | Status: DC | PRN
  Administered 2020-04-07: 100 ug via INTRAVENOUS

## 2020-04-07 MED ORDER — DEXAMETHASONE SODIUM PHOSPHATE 10 MG/ML IJ SOLN
INTRAMUSCULAR | Status: DC | PRN
  Administered 2020-04-07: 10 mg via INTRAVENOUS

## 2020-04-07 MED ORDER — ORAL CARE MOUTH RINSE: 15.0000 mL | Freq: Once | OROMUCOSAL | Status: AC

## 2020-04-07 MED ORDER — CEFAZOLIN SODIUM-DEXTROSE 2-4 GM/100ML-% IV SOLN
2.0000 g | INTRAVENOUS | Status: AC
  Administered 2020-04-07: 2 g via INTRAVENOUS
  Filled 2020-04-07: qty 100

## 2020-04-07 SURGICAL SUPPLY — 38 items
ADH SKN CLS APL DERMABOND .7 (GAUZE/BANDAGES/DRESSINGS) ×2
APL PRP STRL LF ISPRP CHG 10.5 (MISCELLANEOUS) ×1
APPLICATOR CHLORAPREP 10.5 ORG (MISCELLANEOUS) ×3 IMPLANT
BAG DECANTER FOR FLEXI CONT (MISCELLANEOUS) ×3 IMPLANT
CLOTH BEACON ORANGE TIMEOUT ST (SAFETY) ×3 IMPLANT
COVER LIGHT HANDLE STERIS (MISCELLANEOUS) ×6 IMPLANT
COVER PROBE U/S 5X48 (MISCELLANEOUS) ×2 IMPLANT
COVER WAND RF STERILE (DRAPES) ×3 IMPLANT
DECANTER SPIKE VIAL GLASS SM (MISCELLANEOUS) ×3 IMPLANT
DERMABOND ADVANCED (GAUZE/BANDAGES/DRESSINGS) ×4
DERMABOND ADVANCED .7 DNX12 (GAUZE/BANDAGES/DRESSINGS) ×1 IMPLANT
DRAPE C-ARM FOLDED MOBILE STRL (DRAPES) ×3 IMPLANT
ELECT REM PT RETURN 9FT ADLT (ELECTROSURGICAL) ×3
ELECTRODE REM PT RTRN 9FT ADLT (ELECTROSURGICAL) ×1 IMPLANT
GLOVE BIO SURGEON STRL SZ 6.5 (GLOVE) ×2 IMPLANT
GLOVE BIO SURGEONS STRL SZ 6.5 (GLOVE) ×1
GLOVE BIOGEL PI IND STRL 6.5 (GLOVE) ×1 IMPLANT
GLOVE BIOGEL PI IND STRL 7.0 (GLOVE) ×1 IMPLANT
GLOVE BIOGEL PI INDICATOR 6.5 (GLOVE) ×2
GLOVE BIOGEL PI INDICATOR 7.0 (GLOVE) ×2
GOWN STRL REUS W/TWL LRG LVL3 (GOWN DISPOSABLE) ×6 IMPLANT
IV NS 500ML (IV SOLUTION) ×3
IV NS 500ML BAXH (IV SOLUTION) ×1 IMPLANT
KIT PORT POWER 8FR ISP MRI (Port) ×3 IMPLANT
KIT TURNOVER KIT A (KITS) ×3 IMPLANT
MANIFOLD NEPTUNE II (INSTRUMENTS) ×3 IMPLANT
NDL HYPO 25X1 1.5 SAFETY (NEEDLE) ×1 IMPLANT
NEEDLE HYPO 25X1 1.5 SAFETY (NEEDLE) ×3 IMPLANT
PACK MINOR (CUSTOM PROCEDURE TRAY) ×3 IMPLANT
PAD ARMBOARD 7.5X6 YLW CONV (MISCELLANEOUS) ×3 IMPLANT
SET BASIN LINEN APH (SET/KITS/TRAYS/PACK) ×3 IMPLANT
SUT MNCRL AB 4-0 PS2 18 (SUTURE) ×3 IMPLANT
SUT PROLENE 2 0 SH 30 (SUTURE) ×3 IMPLANT
SUT VIC AB 3-0 SH 27 (SUTURE) ×3
SUT VIC AB 3-0 SH 27X BRD (SUTURE) ×1 IMPLANT
SYR 10ML LL (SYRINGE) ×6 IMPLANT
SYR 5ML LL (SYRINGE) ×3 IMPLANT
SYR CONTROL 10ML LL (SYRINGE) ×3 IMPLANT

## 2020-04-07 NOTE — Anesthesia Procedure Notes (Signed)
Procedure Name: LMA Insertion Date/Time: 04/07/2020 10:24 AM Performed by: Jonna Munro, CRNA Pre-anesthesia Checklist: Patient identified, Emergency Drugs available, Suction available, Patient being monitored and Timeout performed Patient Re-evaluated:Patient Re-evaluated prior to induction Oxygen Delivery Method: Circle system utilized Preoxygenation: Pre-oxygenation with 100% oxygen Induction Type: IV induction LMA: LMA inserted LMA Size: 4.0 Number of attempts: 1 Placement Confirmation: positive ETCO2 and breath sounds checked- equal and bilateral Tube secured with: Tape Dental Injury: Teeth and Oropharynx as per pre-operative assessment

## 2020-04-07 NOTE — Discharge Instructions (Signed)
Keep area clean and dry. You can take a shower in 24 hours. Do not submerge in water for at least 4 weeks.  Take tylenol and ibuprofen for pain control and Norco for severe pain. Restart your Eliquis on 04/09/2020.    Implanted Port Insertion, Care After This sheet gives you information about how to care for yourself after your procedure. Your health care provider may also give you more specific instructions. If you have problems or questions, contact your health care provider. What can I expect after the procedure? After the procedure, it is common to have:  Discomfort at the port insertion site.  Bruising on the skin over the port. This should improve over 3-4 days. Follow these instructions at home: [facility] care  After your port is placed, you will get a manufacturer's information card. The card has information about your port. Keep this card with you at all times.  Take care of the port as told by your health care provider. Ask your health care provider if you or a family member can get training for taking care of the port at home. A home health care nurse may also take care of the port.  Make sure to remember what type of port you have. Incision care      Follow instructions from your health care provider about how to take care of your port insertion site. Make sure you: ? Wash your hands with soap and water before and after you change your bandage (dressing). If soap and water are not available, use hand sanitizer. ? Change your dressing as told by your health care provider. ? Leave stitches (sutures), skin glue, or adhesive strips in place. These skin closures may need to stay in place for 2 weeks or longer. If adhesive strip edges start to loosen and curl up, you may trim the loose edges. Do not remove adhesive strips completely unless your health care provider tells you to do that.  Check your port insertion site every day for signs of infection. Check for: ? Redness, swelling,  or pain. ? Fluid or blood. ? Warmth. ? Pus or a bad smell. Activity  Return to your normal activities as told by your health care provider. Ask your health care provider what activities are safe for you.  Do not lift anything that is heavier than 10 lb (4.5 kg), or the limit that you are told, until your health care provider says that it is safe. General instructions  Take over-the-counter and prescription medicines only as told by your health care provider.  Do not take baths, swim, or use a hot tub until your health care provider approves.   You may shower.  Do not drive for 24 hours if you were given a sedative during your procedure.  Wear a medical alert bracelet in case of an emergency. This will tell any health care providers that you have a port.  Keep all follow-up visits as told by your health care provider. This is important. Contact a health care provider if:  You cannot flush your port with saline as directed, or you cannot draw blood from the port.  You have a fever or chills.  You have redness, swelling, or pain around your port insertion site.  You have fluid or blood coming from your port insertion site.  Your port insertion site feels warm to the touch.  You have pus or a bad smell coming from the port insertion site. Get help right away if:  You  have chest pain or shortness of breath.  You have bleeding from your port that you cannot control. Summary  Take care of the port as told by your health care provider. Keep the manufacturer's information card with you at all times.  Change your dressing as told by your health care provider.  Contact a health care provider if you have a fever or chills or if you have redness, swelling, or pain around your port insertion site.  Keep all follow-up visits as told by your health care provider. This information is not intended to replace advice given to you by your health care provider. Make sure you discuss any  questions you have with your health care provider. Document Revised: 05/13/2018 Document Reviewed: 05/13/2018 Elsevier Patient Education  [place] After These instructions provide you with information about caring for yourself after your procedure. Your health care provider may also give you more specific instructions. Your treatment has been planned according to current medical practices, but problems sometimes occur. Call your health care provider if you have any problems or questions after your procedure. What can I expect after the procedure? After your procedure, you may:  Feel sleepy for several hours.  Feel clumsy and have poor balance for several hours.  Feel forgetful about what happened after the procedure.  Have poor judgment for several hours.  Feel nauseous or vomit.  Have a sore throat if you had a breathing tube during the procedure. Follow these instructions at home: For at least 24 hours after the procedure:      Have a responsible adult stay with you. It is important to have someone help care for you until you are awake and alert.  Rest as needed.  Do not: ? Participate in activities in which you could fall or become injured. ? Drive. ? Use heavy machinery. ? Drink alcohol. ? Take sleeping pills or medicines that cause drowsiness. ? Make important decisions or sign legal documents. ? Take care of children on your own. Eating and drinking  Follow the diet that is recommended by your health care provider.  If you vomit, drink water, juice, or soup when you can drink without vomiting.  Make sure you have little or no nausea before eating solid foods. General instructions  Take over-the-counter and prescription medicines only as told by your health care provider.  If you have sleep apnea, surgery and certain medicines can increase your risk for breathing problems. Follow instructions from your health care  provider about wearing your sleep device: ? Anytime you are sleeping, including during daytime naps. ? While taking prescription pain medicines, sleeping medicines, or medicines that make you drowsy.  If you smoke, do not smoke without supervision.  Keep all follow-up visits as told by your health care provider. This is important. Contact a health care provider if:  You keep feeling nauseous or you keep vomiting.  You feel light-headed.  You develop a rash.  You have a fever. Get help right away if:  You have trouble breathing. Summary  For several hours after your procedure, you may feel sleepy and have poor judgment.  Have a responsible adult stay with you for at least 24 hours or until you are awake and alert. This information is not intended to replace advice given to you by your health care provider. Make sure you discuss any questions you have with your health care provider. Document Revised: 01/13/2018 Document Reviewed: 02/05/2016 Elsevier  Patient Education  [employer].

## 2020-04-07 NOTE — Interval H&P Note (Signed)
History and Physical Interval Note:  04/07/2020 9:56 AM  Tom Marshall  has presented today for surgery, with the diagnosis of Multiple myleoma.  The various methods of treatment have been discussed with the patient and family. After consideration of risks, benefits and other options for treatment, the patient has consented to  Procedure(s) with comments: INSERTION PORT-A-CATH (N/A) - if need to move pt up - pt can not be moved to start earlier than 9:00 due to covid test day before as a surgical intervention.  The patient's history has been reviewed, patient examined, no change in status, stable for surgery.  I have reviewed the patient's chart and labs.  Questions were answered to the patient's satisfaction.    Left handed, platelets 54, so do not need to give platelets, held blood thinner. Chronic kidney disease, so will plan for right IJ to start.   Virl Cagey

## 2020-04-07 NOTE — Anesthesia Preprocedure Evaluation (Signed)
Anesthesia Evaluation  Patient identified by MRN, date of birth, ID band Patient awake    Reviewed: Allergy & Precautions, NPO status , Patient's Chart, lab work & pertinent test results  Airway Mallampati: II  TM Distance: >3 FB Neck ROM: Full    Dental no notable dental hx. (+) Dental Advisory Given   Pulmonary neg pulmonary ROS,    Pulmonary exam normal breath sounds clear to auscultation       Cardiovascular Exercise Tolerance: Good Normal cardiovascular exam Rhythm:Regular Rate:Normal  10-Jan-2018 12:55:16 [place] [facility] ROUTINE RECORD Normal sinus rhythm Possible Inferior infarct , age undetermined Abnormal ECG Confirmed by Asencion Noble [phone]) on 01/12/2018 12:00:33 PM   Neuro/Psych negative neurological ROS  negative psych ROS   GI/Hepatic negative GI ROS, Neg liver ROS,   Endo/Other  diabetes, Well Controlled, Type 2, Oral Hypoglycemic Agents  Renal/GU ARF and Renal InsufficiencyRenal disease (right kidney cancer)  negative genitourinary   Musculoskeletal Lumbar vertebral fracture, mets to vertebra column   Abdominal Normal abdominal exam  (+)   Peds negative pediatric ROS (+)  Hematology  (+) anemia , Multiple myeloma   Anesthesia Other Findings   Reproductive/Obstetrics negative OB ROS                             Anesthesia Physical Anesthesia Plan  ASA: IV  Anesthesia Plan: General   Post-op Pain Management:    Induction: Intravenous  PONV Risk Score and Plan: 2 and Ondansetron, Dexamethasone, TIVA and Treatment may vary due to age or medical condition  Airway Management Planned: LMA  Additional Equipment:   Intra-op Plan:   Post-operative Plan:   Informed Consent: I have reviewed the patients History and Physical, chart, labs and discussed the procedure including the risks, benefits and alternatives for the proposed anesthesia with the patient or  authorized representative who has indicated his/her understanding and acceptance.     Dental advisory given  Plan Discussed with: CRNA and Surgeon  Anesthesia Plan Comments:         Anesthesia Quick Evaluation

## 2020-04-07 NOTE — Anesthesia Postprocedure Evaluation (Signed)
Anesthesia Post Note  Patient: Tom Marshall  Procedure(s) Performed: INSERTION PORT-A-CATH (N/A )  Patient location during evaluation: PACU Anesthesia Type: General Level of consciousness: awake, oriented, awake and alert and patient cooperative Pain management: satisfactory to patient Vital Signs Assessment: post-procedure vital signs reviewed and stable Respiratory status: spontaneous breathing, respiratory function stable, nonlabored ventilation and patient connected to face mask oxygen Cardiovascular status: stable Postop Assessment: no apparent nausea or vomiting Anesthetic complications: no   No complications documented.   Last Vitals:  Vitals:   04/07/20 0914  BP: 135/67  Resp: 15  Temp: 36.5 C  SpO2: 97%    Last Pain:  Vitals:   04/07/20 0914  TempSrc: Oral  PainSc: 0-No pain                 Dahlia Nifong

## 2020-04-07 NOTE — Op Note (Signed)
Operative Note 04/07/20   Preoperative Diagnosis:  Multiple myeloma    Postoperative Diagnosis: Same   Procedure(s) Performed: Port-A-Cath placement, right internal jugular    Surgeon: Lanell Matar. Constance Haw, MD   Assistants: No qualified resident was available   Anesthesia: General anesthesia    Anesthesiologist: Denese Killings, MD    Specimens: None   Estimated Blood Loss: Minimal   Blood Replacement: None    Complications: None    Operative Findings:  Normal right internal jugular   Indications: Tom Marshall is a 69 yo with recurrent multiple myeloma needing further treatment. He also on blood thinners, has thrombocytopenia, and chronic kidney disease. He has held his blood thinners and his platelets are > 50K, and given the CKD we opted for an internal jugular approach so that we would not injure or cause scarring to the subclavian veins pending future dialysis needs. We discussed the risk of bleeding, infection, malfunction of the port, pneumothorax, and he opted to proceed.   Procedure: The patient was brought into the operating room and general anesthesia was induced.  One percent lidocaine was used for local anesthesia.   The right chest and neck was prepped and draped in the usual sterile fashion.  Preoperative antibiotics were given.  The needles advanced into the right jugular vein under ultrasound guidance.  using the Seldinger technique without difficulty. A guidewire was then advanced into the right atrium under fluoroscopic guidance.  Ectopia was not noted.  An incision was made below the right clavicle.  A subcutaneous pocket was formed, and the catheter was tunneled to the neck from the pocket.  An introducer and peel-away sheath were placed over the guidewire. The catheter was then inserted through the peel-away sheath and the peel-away sheath was removed.  A spot film was performed to confirm the position. The catheter was then attached to the port and the port placed  in subcutaneous pocket. Adequate positioning was confirmed by fluoroscopy. Hemostasis was confirmed, and the port was secured with 2-0 prolene sutures.  Good backflow of blood was noted on aspiration of the port. The port was flushed with heparin flush. Subcutaneous layer was reapproximated using a 3-0 Vicryl interrupted suture. The skin was closed using a 4-0 Vicryl subcuticular suture. Dermabond was applied.    All tape and needle counts were correct at the end of the procedure. The patient was transferred to PACU in stable condition. A chest x-ray will be performed at that time.  Tom Labrum, MD [facility] [address] [place], [place] [address] [phone] (office)

## 2020-04-07 NOTE — Transfer of Care (Signed)
Immediate Anesthesia Transfer of Care Note  Patient: Tom Marshall  Procedure(s) Performed: INSERTION PORT-A-CATH (N/A )  Patient Location: PACU  Anesthesia Type:General  Level of Consciousness: awake, alert , oriented and patient cooperative  Airway & Oxygen Therapy: Patient Spontanous Breathing and Patient connected to face mask oxygen  Post-op Assessment: Report given to RN and Post -op Vital signs reviewed and stable  Post vital signs: Reviewed and stable  Last Vitals:  Vitals Value Taken Time  BP    Temp    Pulse 91 04/07/20 1116  Resp    SpO2 100 % 04/07/20 1116  Vitals shown include unvalidated device data.  Last Pain:  Vitals:   04/07/20 0914  TempSrc: Oral  PainSc: 0-No pain      Patients Stated Pain Goal: 8 (99/69/24 9324)  Complications: No complications documented.

## 2020-04-07 NOTE — Progress Notes (Signed)
[facility]  Notified family surgery is completed. CXR pending. Norco sent to pharmacy if patient needs it. Eliquis start back on 04/09/2020.   Follow up as needed.  Curlene Labrum, MD [facility] [address] [place], [place] [address] [phone] (office)

## 2020-04-08 ENCOUNTER — Encounter (HOSPITAL_COMMUNITY): Payer: Self-pay | Admitting: General Surgery

## 2020-04-11 ENCOUNTER — Inpatient Hospital Stay (HOSPITAL_COMMUNITY): Payer: Medicare Other

## 2020-04-11 ENCOUNTER — Inpatient Hospital Stay (HOSPITAL_COMMUNITY): Payer: Medicare Other | Attending: Hematology | Admitting: Hematology

## 2020-04-11 ENCOUNTER — Other Ambulatory Visit: Payer: Self-pay

## 2020-04-11 VITALS — BP 131/65 | HR 87 | Temp 98.3°F | Resp 19

## 2020-04-11 DIAGNOSIS — C9000 Multiple myeloma not having achieved remission: Secondary | ICD-10-CM | POA: Diagnosis not present

## 2020-04-11 DIAGNOSIS — Z7984 Long term (current) use of oral hypoglycemic drugs: Secondary | ICD-10-CM | POA: Insufficient documentation

## 2020-04-11 DIAGNOSIS — Z85528 Personal history of other malignant neoplasm of kidney: Secondary | ICD-10-CM | POA: Insufficient documentation

## 2020-04-11 DIAGNOSIS — G4700 Insomnia, unspecified: Secondary | ICD-10-CM | POA: Insufficient documentation

## 2020-04-11 DIAGNOSIS — Z5111 Encounter for antineoplastic chemotherapy: Secondary | ICD-10-CM | POA: Insufficient documentation

## 2020-04-11 DIAGNOSIS — Z7901 Long term (current) use of anticoagulants: Secondary | ICD-10-CM | POA: Insufficient documentation

## 2020-04-11 DIAGNOSIS — Z86718 Personal history of other venous thrombosis and embolism: Secondary | ICD-10-CM | POA: Diagnosis not present

## 2020-04-11 DIAGNOSIS — Z905 Acquired absence of kidney: Secondary | ICD-10-CM | POA: Diagnosis not present

## 2020-04-11 DIAGNOSIS — N184 Chronic kidney disease, stage 4 (severe): Secondary | ICD-10-CM | POA: Insufficient documentation

## 2020-04-11 DIAGNOSIS — E1122 Type 2 diabetes mellitus with diabetic chronic kidney disease: Secondary | ICD-10-CM | POA: Insufficient documentation

## 2020-04-11 DIAGNOSIS — Z79899 Other long term (current) drug therapy: Secondary | ICD-10-CM | POA: Diagnosis not present

## 2020-04-11 LAB — CBC WITH DIFFERENTIAL/PLATELET
Abs Immature Granulocytes: 0.07 10*3/uL (ref 0.00–0.07)
Basophils Absolute: 0 10*3/uL (ref 0.0–0.1)
Basophils Relative: 0 %
Eosinophils Absolute: 0 10*3/uL (ref 0.0–0.5)
Eosinophils Relative: 0 %
HCT: 26.5 % — ABNORMAL LOW (ref 39.0–52.0)
Hemoglobin: 8.4 g/dL — ABNORMAL LOW (ref 13.0–17.0)
Immature Granulocytes: 3 %
Lymphocytes Relative: 22 %
Lymphs Abs: 0.6 10*3/uL — ABNORMAL LOW (ref 0.7–4.0)
MCH: 35 pg — ABNORMAL HIGH (ref 26.0–34.0)
MCHC: 31.7 g/dL (ref 30.0–36.0)
MCV: 110.4 fL — ABNORMAL HIGH (ref 80.0–100.0)
Monocytes Absolute: 0.4 10*3/uL (ref 0.1–1.0)
Monocytes Relative: 15 %
Neutro Abs: 1.5 10*3/uL — ABNORMAL LOW (ref 1.7–7.7)
Neutrophils Relative %: 60 %
Platelets: 75 10*3/uL — ABNORMAL LOW (ref 150–400)
RBC: 2.4 MIL/uL — ABNORMAL LOW (ref 4.22–5.81)
RDW: 16.3 % — ABNORMAL HIGH (ref 11.5–15.5)
WBC: 2.6 10*3/uL — ABNORMAL LOW (ref 4.0–10.5)
nRBC: 2 % — ABNORMAL HIGH (ref 0.0–0.2)

## 2020-04-11 LAB — COMPREHENSIVE METABOLIC PANEL
ALT: 54 U/L — ABNORMAL HIGH (ref 0–44)
AST: 25 U/L (ref 15–41)
Albumin: 3.5 g/dL (ref 3.5–5.0)
Alkaline Phosphatase: 86 U/L (ref 38–126)
Anion gap: 13 (ref 5–15)
BUN: 63 mg/dL — ABNORMAL HIGH (ref 8–23)
CO2: 20 mmol/L — ABNORMAL LOW (ref 22–32)
Calcium: 8.1 mg/dL — ABNORMAL LOW (ref 8.9–10.3)
Chloride: 103 mmol/L (ref 98–111)
Creatinine, Ser: 2.81 mg/dL — ABNORMAL HIGH (ref 0.61–1.24)
GFR calc Af Amer: 25 mL/min — ABNORMAL LOW (ref >60)
GFR calc non Af Amer: 22 mL/min — ABNORMAL LOW (ref >60)
Glucose, Bld: 312 mg/dL — ABNORMAL HIGH (ref 70–99)
Potassium: 4.5 mmol/L (ref 3.5–5.1)
Sodium: 136 mmol/L (ref 135–145)
Total Bilirubin: 0.7 mg/dL (ref 0.3–1.2)
Total Protein: 5.2 g/dL — ABNORMAL LOW (ref 6.5–8.1)

## 2020-04-11 LAB — TYPE AND SCREEN
ABO/RH(D): A POS
Antibody Screen: NEGATIVE

## 2020-04-11 LAB — LACTATE DEHYDROGENASE: LDH: 152 U/L (ref 98–192)

## 2020-04-11 MED ORDER — SODIUM CHLORIDE 0.9 % IV SOLN
20.0000 mg | Freq: Once | INTRAVENOUS | Status: AC
  Administered 2020-04-11: 20 mg via INTRAVENOUS
  Filled 2020-04-11: qty 20

## 2020-04-11 MED ORDER — DIPHENHYDRAMINE HCL 25 MG PO CAPS
50.0000 mg | ORAL_CAPSULE | Freq: Once | ORAL | Status: AC
  Administered 2020-04-11: 50 mg via ORAL
  Filled 2020-04-11: qty 2

## 2020-04-11 MED ORDER — ACYCLOVIR 400 MG PO TABS: 200.0000 mg | ORAL_TABLET | Freq: Two times a day (BID) | ORAL | 4 refills | Status: DC

## 2020-04-11 MED ORDER — SODIUM CHLORIDE 0.9 % IV SOLN: Freq: Once | INTRAVENOUS | Status: AC

## 2020-04-11 MED ORDER — DEXAMETHASONE 4 MG PO TABS
10.0000 mg | ORAL_TABLET | ORAL | 2 refills | Status: DC
Start: 2020-04-11 — End: 2020-05-30

## 2020-04-11 MED ORDER — DIPHENHYDRAMINE HCL 25 MG PO CAPS
ORAL_CAPSULE | ORAL | Status: AC
  Filled 2020-04-11: qty 1

## 2020-04-11 MED ORDER — CYCLOBENZAPRINE HCL 10 MG PO TABS: 10.0000 mg | ORAL_TABLET | Freq: Three times a day (TID) | ORAL | 1 refills | Status: DC | PRN

## 2020-04-11 MED ORDER — SODIUM CHLORIDE 0.9 % IV SOLN: 8.0000 mg/kg | Freq: Once | INTRAVENOUS | Status: DC

## 2020-04-11 MED ORDER — FAMOTIDINE IN NACL 20-0.9 MG/50ML-% IV SOLN
20.0000 mg | Freq: Once | INTRAVENOUS | Status: AC
  Administered 2020-04-11: 20 mg via INTRAVENOUS

## 2020-04-11 MED ORDER — SODIUM CHLORIDE 0.9 % IV SOLN
8.0000 mg/kg | Freq: Once | INTRAVENOUS | Status: AC
  Administered 2020-04-11: 800 mg via INTRAVENOUS
  Filled 2020-04-11: qty 40

## 2020-04-11 MED ORDER — MONTELUKAST SODIUM 10 MG PO TABS
10.0000 mg | ORAL_TABLET | Freq: Once | ORAL | Status: AC
  Administered 2020-04-11: 10 mg via ORAL
  Filled 2020-04-11: qty 1

## 2020-04-11 MED ORDER — HEPARIN SOD (PORK) LOCK FLUSH 100 UNIT/ML IV SOLN
500.0000 [IU] | Freq: Once | INTRAVENOUS | Status: AC | PRN
  Administered 2020-04-11: 500 [IU]

## 2020-04-11 MED ORDER — SODIUM CHLORIDE 0.9% FLUSH
10.0000 mL | INTRAVENOUS | Status: DC | PRN
  Administered 2020-04-11: 10 mL via INTRAVENOUS

## 2020-04-11 MED ORDER — FAMOTIDINE IN NACL 20-0.9 MG/50ML-% IV SOLN
INTRAVENOUS | Status: AC
  Filled 2020-04-11: qty 50

## 2020-04-11 MED ORDER — SODIUM CHLORIDE 0.9% FLUSH
10.0000 mL | INTRAVENOUS | Status: DC | PRN
  Administered 2020-04-11: 10 mL

## 2020-04-11 MED ORDER — ACETAMINOPHEN 325 MG PO TABS
650.0000 mg | ORAL_TABLET | Freq: Once | ORAL | Status: AC
  Administered 2020-04-11: 650 mg via ORAL
  Filled 2020-04-11: qty 2

## 2020-04-11 NOTE — Progress Notes (Signed)
04/11/20  Adding Pepcid 20 mg IVPB to premedications.  T.O. Dr Rhys Martini, PharmD

## 2020-04-11 NOTE — Progress Notes (Signed)
Patient here today for cycle one of treatment. Consent obtained, reviewed medications, he is going to pick up the steroid and lidocaine cream today from the pharmacy. States he had not been taking his fluid pill but Dr. Delton Coombes had reinforced to him to make sure he takes it.   Labs reviewed with MD today. BUN 63, Creatinine 2.81, platelets are 75,000, ANC 1.5. Proceed as planned per MD.   1350-went in to room to check on patient, he states "this stuff makes make you warm don't it".  Vitals obtained, MD notified. Continue as ordered.

## 2020-04-11 NOTE — Patient Instructions (Signed)
[place] at [facility] Discharge Instructions  You were seen today by Dr. Delton Coombes. He went over your recent results. You will be prescribed dexamethasone 10 mg once a week. Continue taking the acyclovir 200 mg daily. Dr. Delton Coombes will see you back in 4 weeks for labs and follow up.   Thank you for choosing [place] at [facility] to provide your oncology and hematology care.  To afford each patient quality time with our provider, please arrive at least 15 minutes before your scheduled appointment time.   If you have a lab appointment with the [place] please come in thru the Main Entrance and check in at the main information desk  You need to re-schedule your appointment should you arrive 10 or more minutes late.  We strive to give you quality time with our providers, and arriving late affects you and other patients whose appointments are after yours.  Also, if you no show three or more times for appointments you may be dismissed from the clinic at the providers discretion.     Again, thank you for choosing [facility].  Our hope is that these requests will decrease the amount of time that you wait before being seen by our physicians.       _____________________________________________________________  Should you have questions after your visit to [facility], please contact our office at [phone] between the hours of 8:00 a.m. and 4:30 p.m.  Voicemails left after 4:00 p.m. will not be returned until the following business day.  For prescription refill requests, have your pharmacy contact our office and allow 72 hours.    Cancer Center Support Programs:   > Cancer Support Group  2nd Tuesday of the month 1pm-2pm, Journey Room

## 2020-04-11 NOTE — Patient Instructions (Signed)
[facility] [place] Discharge Instructions for Patients Receiving Chemotherapy   Beginning January 23rd 2017 lab work for the [facility] will be done in the  Main lab at [facility] on 1st floor. If you have a lab appointment with the [facility] please come in thru the  Main Entrance and check in at the main information desk   Today you received the following chemotherapy agents Darzalex. Follow-up as scheduled  To help prevent nausea and vomiting after your treatment, we encourage you to take your nausea medication   If you develop nausea and vomiting, or diarrhea that is not controlled by your medication, call the clinic.  The clinic phone number is [phone]. Office hours are Monday-Friday 8:30am-5:00pm.  BELOW ARE SYMPTOMS THAT SHOULD BE REPORTED IMMEDIATELY:  *FEVER GREATER THAN 101.0 F  *CHILLS WITH OR WITHOUT FEVER  NAUSEA AND VOMITING THAT IS NOT CONTROLLED WITH YOUR NAUSEA MEDICATION  *UNUSUAL SHORTNESS OF BREATH  *UNUSUAL BRUISING OR BLEEDING  TENDERNESS IN MOUTH AND THROAT WITH OR WITHOUT PRESENCE OF ULCERS  *URINARY PROBLEMS  *BOWEL PROBLEMS  UNUSUAL RASH Items with * indicate a potential emergency and should be followed up as soon as possible. If you have an emergency after office hours please contact your primary care physician or go to the nearest emergency department.  Please call the clinic during office hours if you have any questions or concerns.   You may also contact the Patient Navigator at [phone] should you have any questions or need assistance in obtaining follow up care.      Resources For Cancer Patients and their Caregivers ? American Cancer Society: Can assist with transportation, wigs, general needs, runs Look Good Feel Better.        857 405 1540 ? Cancer Care: Provides financial assistance, online support groups, medication/co-pay assistance.  1-800-813-HOPE 661-647-5745) ? [place] Assists [place] cancer patients and their families through emotional , educational and financial support.  (832) 143-5659 ? [place] DSS Where to apply for food stamps, Medicaid and utility assistance. 510-762-1648 ? [facility]: Transportation to medical appointments. 7040867671 ? Social Security Administration: May apply for disability if have a Stage IV cancer. (901) 242-5158 (604)468-8286 ? [employer], Disability and Transit Services: Assists with nutrition, care and transit needs. 484-093-2861

## 2020-04-11 NOTE — Progress Notes (Signed)
[name] tolerated Darzalex infusion well without issues. VSS upon discharge. Port left accessed,saline locked and flushed for use tomorrow. Pt discharged self ambulatory in satisfactory condition

## 2020-04-11 NOTE — Progress Notes (Signed)
[place], [place] [address]   CLINIC:  Medical Oncology/Hematology  PCP:  Asencion Noble, MD [address] / [place] [address] [phone]   REASON FOR VISIT:  Follow-up for multiple myeloma  PRIOR THERAPY: Transplant followed by Revlimid maintenance.  CURRENT THERAPY: Darzalex  BRIEF ONCOLOGIC HISTORY:  Oncology History  Multiple myeloma ([place])  08/29/2017 Initial Diagnosis   Multiple myeloma (Tom Marshall)   12/10/2017 - 03/31/2018 Chemotherapy   The patient had bortezomib SQ (VELCADE) chemo injection 2.75 mg, 1.3 mg/m2 = 2.75 mg, Subcutaneous,  Once, 5 of 5 cycles Administration: 2.75 mg (12/10/2017), 2.75 mg (12/17/2017), 2.75 mg (12/31/2017), 2.75 mg (01/21/2018), 2.75 mg (02/18/2018), 2.75 mg (03/11/2018)  for chemotherapy treatment.    04/11/2020 -  Chemotherapy   The patient had daratumumab (DARZALEX) 800 mg in sodium chloride 0.9 % 960 mL (0.8 mg/mL) chemo infusion, 8 mg/kg = 800 mg (original dose ), Intravenous, Once, 0 of 1 cycle Dose modification: 8 mg/kg (Cycle 1, Reason: Provider Judgment, Comment: giving total dose 89m/kg over 2 days), 8 mg/kg (Cycle 1, Reason: Provider Judgment, Comment: giving 16 mg/kg over 2 days.) daratumumab (DARZALEX) 1,600 mg in sodium chloride 0.9 % 420 mL (3.2 mg/mL) chemo infusion, 16 mg/kg, Intravenous, Once, 0 of 7 cycles  for chemotherapy treatment.      CANCER STAGING: Cancer Staging No matching staging information was found for the patient.  INTERVAL HISTORY:  Tom Marshall a 69y.o. male, returns for routine follow-up and consideration for next cycle of chemotherapy. WTaydenwas last seen on 03/08/2020.  Due for initiating cycle #1 of Darzalex today.   Overall, he tells me he has been feeling pretty well. He reports bruising around his port on his R shoulder. He started taking Eliquis 2.5 mg daily on Sunday for his DVTs. He finished the radiation on 04/05/2020. He started taking the  Pomalyst this morning. Dr. FWilley Bladeis tracking his diabetes. He will start taking Lasix today.  Overall, he feels ready for next cycle of chemo today.    REVIEW OF SYSTEMS:  Review of Systems  Constitutional: Positive for fatigue (moderate). Negative for appetite change.  Cardiovascular: Positive for leg swelling (feet swelling).  Neurological: Positive for numbness (+ tingling in hands & feet).  All other systems reviewed and are negative.   PAST MEDICAL/SURGICAL HISTORY:  Past Medical History:  Diagnosis Date  . Cancer (Tom Marshall    multiple myeloma  . Cancer of right kidney (Tom Marshall   . Cervical dystonia   . Diabetes mellitus without complication (Tom Marshall   . Gout   . Hypercholesteremia    Past Surgical History:  Procedure Laterality Date  . BONE MARROW BIOPSY    . CHOLECYSTECTOMY  2007  . COLONOSCOPY WITH PROPOFOL N/A 01/16/2018   Procedure: COLONOSCOPY WITH PROPOFOL;  Surgeon: Tom Dolin MD;  [facility];  Service: Endoscopy;  Laterality: N/A;  1:45pm  . ESOPHAGOGASTRODUODENOSCOPY (EGD) WITH PROPOFOL N/A 01/16/2018   Procedure: ESOPHAGOGASTRODUODENOSCOPY (EGD) WITH PROPOFOL;  Surgeon: Tom Dolin MD;  Location: [facility];  Service: Endoscopy;  Laterality: N/A;  . GIVENS CAPSULE STUDY N/A 05/12/2018   Procedure: GIVENS CAPSULE STUDY;  Surgeon: Tom Dolin MD;  Location: [facility];  Service: Endoscopy;  Laterality: N/A;  7:30am  . NEPHRECTOMY Right 1998   cancer  . PORTACATH PLACEMENT Right 04/07/2020   Procedure: INSERTION PORT-A-CATH (attached catherter in right internal jugular);  Surgeon: Tom Cagey MD;  Location: [facility];  Service: General;  Laterality: Right;    SOCIAL HISTORY:  Social History   Socioeconomic History  . Marital status: Single    Spouse name: Not on file  . Number of children: Not on file  . Years of education: Not on file  . Highest education level: Not on file  Occupational History  . Occupation: Designer, jewellery, Insurance underwriter  Tobacco Use  . Smoking status: Never Smoker  . Smokeless tobacco: Former Systems developer    Types: Secondary school teacher  . Vaping Use: Never used  Substance and Sexual Activity  . Alcohol use: No  . Drug use: No  . Sexual activity: Not Currently  Other Topics Concern  . Not on file  Social History Narrative  . Not on file   Social Determinants of Health   Financial Resource Strain:   . Difficulty of Paying Living Expenses:   Food Insecurity:   . Worried About Charity fundraiser in the Last Year:   . Arboriculturist in the Last Year:   Transportation Needs:   . Film/video editor (Medical):   [place] Lack of Transportation (Non-Medical):   Physical Activity:   . Days of Exercise per Week:   . Minutes of Exercise per Session:   Stress:   . Feeling of Stress :   Social Connections:   . Frequency of Communication with Friends and Family:   . Frequency of Social Gatherings with Friends and Family:   . Attends Religious Services:   . Active Member of Clubs or Organizations:   . Attends Archivist Meetings:   Tom Marshall Marital Status:   Intimate Partner Violence:   . Fear of Current or Ex-Partner:   . Emotionally Abused:   Tom [place] Physically Abused:   . Sexually Abused:     FAMILY HISTORY:  Family History  Problem Relation Age of Onset  . Heart failure Mother 96  . Dementia Father   . Colon cancer Neg Hx     CURRENT MEDICATIONS:  Current Outpatient Medications  Medication Sig Dispense Refill  . acyclovir (ZOVIRAX) 400 MG tablet Take 1 tablet (400 mg total) by mouth 2 (two) times daily. 60 tablet 11  . allopurinol (ZYLOPRIM) 300 MG tablet Take 300 mg by mouth every morning.    . B-D UF III MINI PEN NEEDLES 31G X 5 MM MISC   4  . DARATUMUMAB IV Inject into the vein. Weeks 1-8 weekly x 8 doses; then every 2 weeks x 8 doses; then every 28 days    . dexamethasone (DECADRON) 4 MG tablet Take 1 tablet (4 mg total) by mouth 2 (two) times daily with a meal. 30 tablet 2  .  ELIQUIS 2.5 MG TABS tablet TAKE 1 TABLET BY MOUTH TWICE DAILY AFTER COMPLETION OF THE STARTER PACK. (Patient taking differently: Take 2.5 mg by mouth daily. ) 60 tablet 11  . furosemide (LASIX) 40 MG tablet Take 40 mg by mouth every Monday, Wednesday, and Friday.     . gabapentin (NEURONTIN) 300 MG capsule TAKE 1 CAPSULE BY MOUTH AT BEDTIME (Patient taking differently: Take 300 mg by mouth at bedtime. ) 30 capsule 1  . glipiZIDE (GLUCOTROL XL) 5 MG 24 hr tablet Take 5 mg by mouth daily with breakfast.     . Multiple Vitamins-Minerals (CENTRUM SILVER 50+MEN) TABS Take 1 tablet by mouth every morning.    . pomalidomide (POMALYST) 3 MG capsule Take 1 capsule (3 mg total) by mouth  daily. Take with water on days 1-21. Repeat every 28 days. 21 capsule 0  . pravastatin (PRAVACHOL) 20 MG tablet Take 20 mg by mouth at bedtime.     . prochlorperazine (COMPAZINE) 10 MG tablet Take 1 tablet (10 mg total) by mouth every 6 (six) hours as needed (Nausea or vomiting). 30 tablet 1  . sodium bicarbonate 650 MG tablet Take 1 tablet (650 mg total) by mouth 2 (two) times daily. (Patient taking differently: Take 1,300 mg by mouth 2 (two) times daily. ) 60 tablet 1  . traZODone (DESYREL) 100 MG tablet Take 1 tablet (100 mg total) by mouth at bedtime. 30 tablet 3  . TRESIBA FLEXTOUCH 200 UNIT/ML SOPN Inject 44 Units into the skin daily.     . TRUE METRIX BLOOD GLUCOSE TEST test strip     . cyclobenzaprine (FLEXERIL) 10 MG tablet Take 10 mg by mouth 3 (three) times daily as needed for muscle spasms.  (Patient not taking: Reported on 04/11/2020)    . HYDROcodone-acetaminophen (NORCO/VICODIN) 5-325 MG tablet Take 1 tablet by mouth every 4 (four) hours as needed for severe pain. (Patient not taking: Reported on 04/11/2020) 5 tablet 0  . lidocaine-prilocaine (EMLA) cream Apply small amount over port site 1 hour prior to appointment.  Cover with plastic wrap. (Patient not taking: Reported on 04/11/2020) 30 g 3   No current  facility-administered medications for this visit.   Facility-Administered Medications Ordered in Other Visits  Medication Dose Route Frequency Provider Last Rate Last Admin  . 0.9 %  sodium chloride infusion  250 mL Intravenous Once Twana First, MD        ALLERGIES:  No Known Allergies  PHYSICAL EXAM:  Performance status (ECOG): 1 - Symptomatic but completely ambulatory  There were no vitals filed for this visit. Wt Readings from Last 3 Encounters:  04/06/20 220 lb (99.8 kg)  03/15/20 221 lb 12.8 oz (100.6 kg)  03/08/20 223 lb (101.2 kg)   Physical Exam Vitals reviewed.  Constitutional:      Appearance: Normal appearance. He is obese.  Cardiovascular:     Rate and Rhythm: Normal rate and regular rhythm.     Pulses: Normal pulses.     Heart sounds: Normal heart sounds.  Pulmonary:     Effort: Pulmonary effort is normal.     Breath sounds: Normal breath sounds.  Musculoskeletal:     Right lower leg: Edema (+2) present.     Left lower leg: Edema (+2) present.     Comments: Port R shoulder  Neurological:     General: No focal deficit present.     Mental Status: He is alert and oriented to person, place, and time.  Psychiatric:        Mood and Affect: Mood normal.        Behavior: Behavior normal.     LABORATORY DATA:  I have reviewed the labs as listed.  CBC Latest Ref Rng & Units 04/11/2020 04/06/2020 03/15/2020  WBC 4.0 - 10.5 K/uL 2.6(L) 3.0(L) 3.9(L)  Hemoglobin 13.0 - 17.0 g/dL 8.4(L) 8.7(L) 9.4(L)  Hematocrit 39 - 52 % 26.5(L) 27.4(L) 28.1(L)  Platelets 150 - 400 K/uL 75(L) 56(L) 44(L)   CMP Latest Ref Rng & Units 04/11/2020 04/06/2020 03/15/2020  Glucose 70 - 99 mg/dL 312(H) 233(H) 275(H)  BUN 8 - 23 mg/dL 63(H) 65(H) 54(H)  Creatinine 0.61 - 1.24 mg/dL 2.81(H) 3.02(H) 3.72(H)  Sodium 135 - 145 mmol/L 136 133(L) 139  Potassium 3.5 - 5.1 mmol/L 4.5  4.2 4.1  Chloride 98 - 111 mmol/L 103 101 102  CO2 22 - 32 mmol/L 20(L) 19(L) 22  Calcium 8.9 - 10.3 mg/dL 8.1(L)  8.2(L) 9.5  Total Protein 6.5 - 8.1 g/dL 5.2(L) 5.8(L) 6.9  Total Bilirubin 0.3 - 1.2 mg/dL 0.7 0.8 1.0  Alkaline Phos 38 - 126 U/L 86 77 71  AST 15 - 41 U/L '25 18 17  ' ALT 0 - 44 U/L 54(H) 56(H) 28    DIAGNOSTIC IMAGING:  I have independently reviewed the scans and discussed with the patient. MR Thoracic Spine Wo Contrast  Result Date: 03/17/2020 CLINICAL DATA:  69 year old male with multiple myeloma. Right scapula pain. EXAM: MRI THORACIC SPINE WITHOUT CONTRAST TECHNIQUE: Multiplanar, multisequence MR imaging of the thoracic spine was performed. No intravenous contrast was administered. COMPARISON:  PET-CT 02/26/2020 FINDINGS: Limited cervical spine imaging: Diffusely abnormal cervical vertebral marrow signal (series 20). But no pathologic compression fracture is evident. There is evidence of degenerative cervical spinal stenosis at C3-C4. Thoracic spine segmentation:  Appears to be normal. Alignment: Moderate levoconvex upper thoracic scoliosis. Focal kyphosis about the T4 level, where there is also mild T4-T5 anterolisthesis. Vertebrae: Diffusely abnormal marrow signal throughout the thoracic spine. Chronic appearing pathologic compression fracture of T4 with anterior wedging. At the T5 vertebral level there is asymmetrically abnormal T2 and STIR hyperintensity in the right aspect of the vertebral body which is contiguous with isointense abnormal soft tissue protruding into the right epidural space and right T5 neural foramen. See series 23, image 13, series 26, images 22 and 23, series 21, image 13. Subsequent spinal stenosis and mild to moderate cord compression. Associated moderate to severe right T5 neural foraminal stenosis. Similar confluent abnormal signal in the T8 posterior elements on the right (series 26, image 38 and series 21, image 11), T11 midline lamina (series 21, image 13). But no other epidural space involvement. However, there is also extraosseous extension of tumor at the right T8  costovertebral junction (series 24, image 18 and series 21, image 6). Associated widespread abnormal bilateral rib marrow signal. Cord: No spinal cord signal abnormality despite the mild to moderate cord compression at T5. No other malignant thoracic spinal stenosis. There is borderline to mild degenerative spinal stenosis at T10-T11 and part related to posterior element hypertrophy. Paraspinal and other soft tissues: Negative visible thoracic and upper abdominal viscera. Abnormal thoracic paraspinal soft tissues detailed above. Disc levels: Thoracic spine degeneration in the setting of levoconvex scoliosis, although no significant degenerative spinal stenosis at this time. IMPRESSION: 1. Diffuse multiple myeloma in the visible spine with bulky epidural extension of tumor on the right at T5 resulting in mild to moderate spinal cord compression and moderate to severe right T5 neural foraminal stenosis. However, there is no associated cord signal abnormality. 2. Extraosseous tumor also at the right T8 costovertebral junction. Chronic T4 pathologic compression fracture. 3. Evidence also of degenerative versus malignant cervical spinal stenosis at C3-C4. These results will be called to the ordering clinician or representative by the Radiologist Assistant, and communication documented in the PACS or [employer]. Electronically Signed   By: Genevie Ann M.D.   On: 03/17/2020 22:47   DG Chest Port 1 View  Result Date: 04/07/2020 CLINICAL DATA:  Port-A-Cath placement.  Multiple myeloma EXAM: PORTABLE CHEST 1 VIEW COMPARISON:  July 23, 2017 chest radiograph; PET-CT February 26, 2020 FINDINGS: Port-A-Cath tip is in the superior vena cava. No pneumothorax. There is atelectatic change in the left base. Lungs elsewhere are clear.  Heart is mildly enlarged with pulmonary vascular normal. No adenopathy. There is upper to midthoracic levoscoliosis with questionable lytic change in the midthoracic region. IMPRESSION:  Port-A-Cath tip in superior vena cava.  No pneumothorax. Left base atelectasis.  Lungs otherwise clear. Heart mildly enlarged. Electronically Signed   By: Lowella Grip III M.D.   On: 04/07/2020 11:46   DG C-Arm 1-60 Min-No Report  Result Date: 04/07/2020 Fluoroscopy was utilized by the requesting physician.  No radiographic interpretation.     ASSESSMENT:  1.  IgG kappa multiple myeloma: -4 cycles of RVD from 08/30/2017 through 02/18/2018. -Declined bone marrow transplant.  Received maintenance Revlimid 2.5 mg 3 weeks on 1 week off. -Bone marrow biopsy on 02/23/2020 shows 21% plasma cells.  Occasional sections had up to 75% myeloma cells. -Chromosome analysis was normal.  FISH panel was positive for gain of 1 q., monosomy 13, del 17 P, t(14;20) -PET scan showed new left frontal destructive lesion.  Numerous areas of lytic changes in the spine.  Lucent areas in T6 with loss of height at T5.  Numerous lytic changes throughout the spine noted along with pedicle and lamina and transverse process of T8 vertebral body.  Profound hypermetabolic activity within the ribs bilaterally.   2.  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 -Completed XRT on 04/05/2020.  3.  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  He is on Eliquis.    PLAN:  1.  IgG kappa multiple myeloma: -Myeloma labs from 02/09/2020 showed worsening labs. -We have sent baseline labs today. -Based on the progression of his myeloma and aggressive FISH panel, I have recommended changing therapy to daratumumab, pomalidomide and dexamethasone-based regimen. -We discussed the side effects in detail.  He will start pomalidomide 3 mg 3 weeks on 1 week off today. -He will proceed with daratumumab today.  Because  of his diabetes, we will start him at 10 mg of dexamethasone weekly.  We have sent a prescription.  2.  CKD: -Creatinine today is 2.81.  We will closely monitor.  3.  Left leg DVT: -Continue Eliquis.  He is on 2.5 mg daily.  No bleeding issues.  4.  Diabetes: -Continue Tresiba 30 units daily and glipizide 5 mg daily.  5.  Insomnia: -Continue trazodone 100 mg at bedtime as needed.    Orders placed this encounter:  No orders of the defined types were placed in this encounter.    Derek Jack, MD [facility] [phone]   I, Milinda Antis, am acting as a scribe for Dr. Sanda Linger.  I, Derek Jack MD, have reviewed the above documentation for accuracy and completeness, and I agree with the above.

## 2020-04-11 NOTE — Progress Notes (Signed)
Patient has been assessed, vital signs and labs have been reviewed by Dr. Katragadda. ANC, Creatinine, LFTs, and Platelets are within treatment parameters per Dr. Katragadda. The patient is good to proceed with treatment at this time.  

## 2020-04-12 ENCOUNTER — Inpatient Hospital Stay (HOSPITAL_COMMUNITY): Payer: Medicare Other

## 2020-04-12 VITALS — BP 138/66 | HR 72 | Temp 97.5°F | Resp 18 | Wt 222.4 lb

## 2020-04-12 DIAGNOSIS — C9000 Multiple myeloma not having achieved remission: Secondary | ICD-10-CM

## 2020-04-12 DIAGNOSIS — Z5111 Encounter for antineoplastic chemotherapy: Secondary | ICD-10-CM | POA: Diagnosis not present

## 2020-04-12 LAB — KAPPA/LAMBDA LIGHT CHAINS
Kappa free light chain: 1058 mg/L — ABNORMAL HIGH (ref 3.3–19.4)
Kappa, lambda light chain ratio: 130.62 — ABNORMAL HIGH (ref 0.26–1.65)
Lambda free light chains: 8.1 mg/L (ref 5.7–26.3)

## 2020-04-12 LAB — PRETREATMENT RBC PHENOTYPE

## 2020-04-12 MED ORDER — SODIUM CHLORIDE 0.9 % IV SOLN
20.0000 mg | Freq: Once | INTRAVENOUS | Status: AC
  Administered 2020-04-12: 20 mg via INTRAVENOUS
  Filled 2020-04-12: qty 20

## 2020-04-12 MED ORDER — HEPARIN SOD (PORK) LOCK FLUSH 100 UNIT/ML IV SOLN
500.0000 [IU] | Freq: Once | INTRAVENOUS | Status: AC | PRN
  Administered 2020-04-12: 500 [IU]

## 2020-04-12 MED ORDER — MONTELUKAST SODIUM 10 MG PO TABS
10.0000 mg | ORAL_TABLET | Freq: Once | ORAL | Status: AC
  Administered 2020-04-12: 10 mg via ORAL
  Filled 2020-04-12: qty 1

## 2020-04-12 MED ORDER — SODIUM CHLORIDE 0.9 % IV SOLN: Freq: Once | INTRAVENOUS | Status: AC

## 2020-04-12 MED ORDER — SODIUM CHLORIDE 0.9% FLUSH
10.0000 mL | INTRAVENOUS | Status: DC | PRN
  Administered 2020-04-12: 10 mL

## 2020-04-12 MED ORDER — SODIUM CHLORIDE 0.9 % IV SOLN
8.0000 mg/kg | Freq: Once | INTRAVENOUS | Status: AC
  Administered 2020-04-12: 800 mg via INTRAVENOUS
  Filled 2020-04-12: qty 40

## 2020-04-12 MED ORDER — ACETAMINOPHEN 325 MG PO TABS
650.0000 mg | ORAL_TABLET | Freq: Once | ORAL | Status: AC
  Administered 2020-04-12: 650 mg via ORAL
  Filled 2020-04-12: qty 2

## 2020-04-12 MED ORDER — DIPHENHYDRAMINE HCL 25 MG PO CAPS
50.0000 mg | ORAL_CAPSULE | Freq: Once | ORAL | Status: AC
  Administered 2020-04-12: 50 mg via ORAL
  Filled 2020-04-12: qty 2

## 2020-04-12 MED ORDER — FAMOTIDINE IN NACL 20-0.9 MG/50ML-% IV SOLN
20.0000 mg | Freq: Once | INTRAVENOUS | Status: AC
  Administered 2020-04-12: 20 mg via INTRAVENOUS
  Filled 2020-04-12: qty 50

## 2020-04-12 NOTE — Progress Notes (Signed)
Patient tolerated chemotherapy with no complaints voiced.  Side effects with management reviewed with understanding verbalized.  Port site clean and dry with bruising noted from port insertion.  No complaints of pain at site and flushed easily.   Good blood return noted before and after administration of chemotherapy.  Band aid applied.  Patient left ambulatory with VSS and no s/s of distress noted.

## 2020-04-12 NOTE — Progress Notes (Signed)
Patient presents today for treatment, day 2. Vital signs are within parameters for treatment. Patient has no complaints of any pain or changes since yesterday's treatment. Patient denies any nausea, vomiting, rashes.

## 2020-04-13 ENCOUNTER — Telehealth (HOSPITAL_COMMUNITY): Payer: Self-pay

## 2020-04-13 LAB — PROTEIN ELECTROPHORESIS, SERUM
A/G Ratio: 1.9 — ABNORMAL HIGH (ref 0.7–1.7)
Albumin ELP: 3.1 g/dL (ref 2.9–4.4)
Alpha-1-Globulin: 0.1 g/dL (ref 0.0–0.4)
Alpha-2-Globulin: 0.5 g/dL (ref 0.4–1.0)
Beta Globulin: 0.6 g/dL — ABNORMAL LOW (ref 0.7–1.3)
Gamma Globulin: 0.4 g/dL (ref 0.4–1.8)
Globulin, Total: 1.6 g/dL — ABNORMAL LOW (ref 2.2–3.9)
Total Protein ELP: 4.7 g/dL — ABNORMAL LOW (ref 6.0–8.5)

## 2020-04-13 NOTE — Telephone Encounter (Signed)
24 hr Chemo F/U call : Both mobile and home phones called with no answer / no message left

## 2020-04-18 ENCOUNTER — Other Ambulatory Visit (HOSPITAL_COMMUNITY): Payer: Medicare Other

## 2020-04-18 ENCOUNTER — Inpatient Hospital Stay (HOSPITAL_COMMUNITY): Payer: Medicare Other

## 2020-04-18 ENCOUNTER — Ambulatory Visit (HOSPITAL_COMMUNITY)
Admission: RE | Admit: 2020-04-18 | Discharge: 2020-04-18 | Disposition: A | Discharge status: Home / Self Care | Payer: Medicare Other | Source: Ambulatory Visit | Attending: Oncology | Admitting: Oncology

## 2020-04-18 ENCOUNTER — Other Ambulatory Visit (HOSPITAL_COMMUNITY): Payer: Self-pay | Admitting: Oncology

## 2020-04-18 ENCOUNTER — Other Ambulatory Visit: Payer: Self-pay

## 2020-04-18 ENCOUNTER — Ambulatory Visit (HOSPITAL_COMMUNITY): Payer: Medicare Other | Admitting: Oncology

## 2020-04-18 ENCOUNTER — Ambulatory Visit (HOSPITAL_COMMUNITY): Payer: Medicare Other

## 2020-04-18 ENCOUNTER — Encounter (HOSPITAL_COMMUNITY): Payer: Self-pay

## 2020-04-18 DIAGNOSIS — C9000 Multiple myeloma not having achieved remission: Secondary | ICD-10-CM

## 2020-04-18 DIAGNOSIS — R0602 Shortness of breath: Secondary | ICD-10-CM

## 2020-04-18 DIAGNOSIS — Z5111 Encounter for antineoplastic chemotherapy: Secondary | ICD-10-CM | POA: Diagnosis not present

## 2020-04-18 LAB — CBC WITH DIFFERENTIAL/PLATELET
Abs Immature Granulocytes: 0.02 10*3/uL (ref 0.00–0.07)
Basophils Absolute: 0 10*3/uL (ref 0.0–0.1)
Basophils Relative: 0 %
Eosinophils Absolute: 0 10*3/uL (ref 0.0–0.5)
Eosinophils Relative: 2 %
HCT: 26.1 % — ABNORMAL LOW (ref 39.0–52.0)
Hemoglobin: 8.4 g/dL — ABNORMAL LOW (ref 13.0–17.0)
Immature Granulocytes: 1 %
Lymphocytes Relative: 14 %
Lymphs Abs: 0.4 10*3/uL — ABNORMAL LOW (ref 0.7–4.0)
MCH: 34.4 pg — ABNORMAL HIGH (ref 26.0–34.0)
MCHC: 32.2 g/dL (ref 30.0–36.0)
MCV: 107 fL — ABNORMAL HIGH (ref 80.0–100.0)
Monocytes Absolute: 0.3 10*3/uL (ref 0.1–1.0)
Monocytes Relative: 10 %
Neutro Abs: 1.9 10*3/uL (ref 1.7–7.7)
Neutrophils Relative %: 73 %
Platelets: 49 10*3/uL — ABNORMAL LOW (ref 150–400)
RBC: 2.44 MIL/uL — ABNORMAL LOW (ref 4.22–5.81)
RDW: 15.1 % (ref 11.5–15.5)
WBC: 2.6 10*3/uL — ABNORMAL LOW (ref 4.0–10.5)
nRBC: 0 % (ref 0.0–0.2)

## 2020-04-18 LAB — COMPREHENSIVE METABOLIC PANEL
ALT: 37 U/L (ref 0–44)
AST: 20 U/L (ref 15–41)
Albumin: 2.9 g/dL — ABNORMAL LOW (ref 3.5–5.0)
Alkaline Phosphatase: 94 U/L (ref 38–126)
Anion gap: 11 (ref 5–15)
BUN: 52 mg/dL — ABNORMAL HIGH (ref 8–23)
CO2: 24 mmol/L (ref 22–32)
Calcium: 8.1 mg/dL — ABNORMAL LOW (ref 8.9–10.3)
Chloride: 99 mmol/L (ref 98–111)
Creatinine, Ser: 2.9 mg/dL — ABNORMAL HIGH (ref 0.61–1.24)
GFR calc Af Amer: 24 mL/min — ABNORMAL LOW (ref >60)
GFR calc non Af Amer: 21 mL/min — ABNORMAL LOW (ref >60)
Glucose, Bld: 244 mg/dL — ABNORMAL HIGH (ref 70–99)
Potassium: 4.2 mmol/L (ref 3.5–5.1)
Sodium: 134 mmol/L — ABNORMAL LOW (ref 135–145)
Total Bilirubin: 1 mg/dL (ref 0.3–1.2)
Total Protein: 5.5 g/dL — ABNORMAL LOW (ref 6.5–8.1)

## 2020-04-18 MED ORDER — TECHNETIUM TO 99M ALBUMIN AGGREGATED
1.5000 | Freq: Once | INTRAVENOUS | Status: AC | PRN
  Administered 2020-04-18: 1.55 via INTRAVENOUS

## 2020-04-18 NOTE — Progress Notes (Signed)
Pt presents to the clinic today for C1, D8 daratumumab.  He reports feeling short of breath with minimal exertion.  Pt noted to be tachypneic at rest w/ RR 22-24.  Pt also noted to be tachycardic at rest with HR 110-114.  SpO2 93-94% RA.  Pt also reports having "balance issues" this morning - denies falls, and gait is steady on assessment. Pt w/ hx of DVT, on Eliquis 2.5 mg daily - denies any missed doses.  The above was discussed with T. Kefalas, PA-C.  Order rec'd for CT chest angio, but pt is unable to receive IV contrast d/t elevated creatinine.  Order rec'd for VQ scan and 2 view CXR.  Hold tx today, and return to clinic tomorrow for possible tx.

## 2020-04-19 ENCOUNTER — Inpatient Hospital Stay (HOSPITAL_COMMUNITY): Payer: Medicare Other

## 2020-04-19 VITALS — BP 138/58 | HR 85 | Temp 97.5°F | Resp 20

## 2020-04-19 DIAGNOSIS — R309 Painful micturition, unspecified: Secondary | ICD-10-CM

## 2020-04-19 DIAGNOSIS — C9000 Multiple myeloma not having achieved remission: Secondary | ICD-10-CM

## 2020-04-19 DIAGNOSIS — Z5111 Encounter for antineoplastic chemotherapy: Secondary | ICD-10-CM | POA: Diagnosis not present

## 2020-04-19 LAB — URINALYSIS, DIPSTICK ONLY
Bilirubin Urine: NEGATIVE
Glucose, UA: >=500 mg/dL — AB
Ketones, ur: NEGATIVE mg/dL
Leukocytes,Ua: NEGATIVE
Nitrite: NEGATIVE
Protein, ur: 30 mg/dL — AB
Specific Gravity, Urine: 1.009 (ref 1.005–1.030)
pH: 5 (ref 5.0–8.0)

## 2020-04-19 MED ORDER — FAMOTIDINE IN NACL 20-0.9 MG/50ML-% IV SOLN
20.0000 mg | Freq: Once | INTRAVENOUS | Status: AC
  Administered 2020-04-19: 20 mg via INTRAVENOUS
  Filled 2020-04-19: qty 50

## 2020-04-19 MED ORDER — SODIUM CHLORIDE 0.9 % IV SOLN
16.0000 mg/kg | Freq: Once | INTRAVENOUS | Status: AC
  Administered 2020-04-19: 1600 mg via INTRAVENOUS
  Filled 2020-04-19: qty 80

## 2020-04-19 MED ORDER — SODIUM CHLORIDE 0.9% FLUSH
10.0000 mL | INTRAVENOUS | Status: DC | PRN
  Administered 2020-04-19: 10 mL

## 2020-04-19 MED ORDER — DIPHENHYDRAMINE HCL 25 MG PO CAPS
50.0000 mg | ORAL_CAPSULE | Freq: Once | ORAL | Status: AC
  Administered 2020-04-19: 50 mg via ORAL
  Filled 2020-04-19: qty 2

## 2020-04-19 MED ORDER — ACETAMINOPHEN 325 MG PO TABS
650.0000 mg | ORAL_TABLET | Freq: Once | ORAL | Status: AC
  Administered 2020-04-19: 650 mg via ORAL
  Filled 2020-04-19: qty 2

## 2020-04-19 MED ORDER — SODIUM CHLORIDE 0.9 % IV SOLN
20.0000 mg | Freq: Once | INTRAVENOUS | Status: AC
  Administered 2020-04-19: 20 mg via INTRAVENOUS
  Filled 2020-04-19: qty 20

## 2020-04-19 MED ORDER — SODIUM CHLORIDE 0.9 % IV SOLN: INTRAVENOUS | Status: DC

## 2020-04-19 MED ORDER — HEPARIN SOD (PORK) LOCK FLUSH 100 UNIT/ML IV SOLN
500.0000 [IU] | Freq: Once | INTRAVENOUS | Status: AC | PRN
  Administered 2020-04-19: 500 [IU]

## 2020-04-19 MED ORDER — SODIUM CHLORIDE 0.9 % IV SOLN: Freq: Once | INTRAVENOUS | Status: AC

## 2020-04-19 NOTE — Progress Notes (Signed)
Patient back today for possible treatment, chest xray and scan negative for a PE and ok to treat per Kirby Crigler PA. Platelets noted, no changes at this time. Bilateral +2 lower extremity swelling noted. Will get NP to assess and see if we need to change dose on lasix.  PA came in to the room to see patient this afternoon and instructed him to take his lasix everyday till next Monday when he comes back for treatment. Patient understands.

## 2020-04-19 NOTE — Progress Notes (Signed)
Tolerated chemotherapy well today.  VS stable.  Discharged via wheelchair.

## 2020-04-19 NOTE — Patient Instructions (Signed)
[facility] Discharge Instructions for Patients Receiving Chemotherapy  Today you received the following chemotherapy agents   To help prevent nausea and vomiting after your treatment, we encourage you to take your nausea medication   If you develop nausea and vomiting that is not controlled by your nausea medication, call the clinic.   BELOW ARE SYMPTOMS THAT SHOULD BE REPORTED IMMEDIATELY:  *FEVER GREATER THAN 100.5 F  *CHILLS WITH OR WITHOUT FEVER  NAUSEA AND VOMITING THAT IS NOT CONTROLLED WITH YOUR NAUSEA MEDICATION  *UNUSUAL SHORTNESS OF BREATH  *UNUSUAL BRUISING OR BLEEDING  TENDERNESS IN MOUTH AND THROAT WITH OR WITHOUT PRESENCE OF ULCERS  *URINARY PROBLEMS  *BOWEL PROBLEMS  UNUSUAL RASH Items with * indicate a potential emergency and should be followed up as soon as possible.  Feel free to call the clinic should you have any questions or concerns. The clinic phone number is [phone].  Please show the CHEMO ALERT CARD at check-in to the Emergency Department and triage nurse.   

## 2020-04-19 NOTE — Progress Notes (Signed)
0945 - Patient is taking Pomalyst and has not missed any doses and reports no side effects at this time.

## 2020-04-25 ENCOUNTER — Ambulatory Visit (HOSPITAL_COMMUNITY): Payer: Medicare Other | Admitting: Oncology

## 2020-04-25 ENCOUNTER — Inpatient Hospital Stay (HOSPITAL_COMMUNITY): Payer: Medicare Other

## 2020-04-25 ENCOUNTER — Encounter (HOSPITAL_COMMUNITY): Payer: Self-pay

## 2020-04-25 ENCOUNTER — Ambulatory Visit (HOSPITAL_COMMUNITY): Payer: Medicare Other

## 2020-04-25 ENCOUNTER — Other Ambulatory Visit (HOSPITAL_COMMUNITY): Payer: Medicare Other

## 2020-04-25 DIAGNOSIS — C9000 Multiple myeloma not having achieved remission: Secondary | ICD-10-CM

## 2020-04-25 DIAGNOSIS — Z5111 Encounter for antineoplastic chemotherapy: Secondary | ICD-10-CM | POA: Diagnosis not present

## 2020-04-25 LAB — CBC WITH DIFFERENTIAL/PLATELET
Abs Immature Granulocytes: 0.04 10*3/uL (ref 0.00–0.07)
Basophils Absolute: 0 10*3/uL (ref 0.0–0.1)
Basophils Relative: 0 %
Eosinophils Absolute: 0 10*3/uL (ref 0.0–0.5)
Eosinophils Relative: 3 %
HCT: 26.1 % — ABNORMAL LOW (ref 39.0–52.0)
Hemoglobin: 8.5 g/dL — ABNORMAL LOW (ref 13.0–17.0)
Immature Granulocytes: 4 %
Lymphocytes Relative: 35 %
Lymphs Abs: 0.4 10*3/uL — ABNORMAL LOW (ref 0.7–4.0)
MCH: 33.6 pg (ref 26.0–34.0)
MCHC: 32.6 g/dL (ref 30.0–36.0)
MCV: 103.2 fL — ABNORMAL HIGH (ref 80.0–100.0)
Monocytes Absolute: 0.2 10*3/uL (ref 0.1–1.0)
Monocytes Relative: 20 %
Neutro Abs: 0.4 10*3/uL — ABNORMAL LOW (ref 1.7–7.7)
Neutrophils Relative %: 38 %
Platelets: 34 10*3/uL — ABNORMAL LOW (ref 150–400)
RBC: 2.53 MIL/uL — ABNORMAL LOW (ref 4.22–5.81)
RDW: 14.6 % (ref 11.5–15.5)
WBC: 1.1 10*3/uL — CL (ref 4.0–10.5)
nRBC: 0 % (ref 0.0–0.2)

## 2020-04-25 LAB — COMPREHENSIVE METABOLIC PANEL
ALT: 27 U/L (ref 0–44)
AST: 9 U/L — ABNORMAL LOW (ref 15–41)
Albumin: 3.1 g/dL — ABNORMAL LOW (ref 3.5–5.0)
Alkaline Phosphatase: 111 U/L (ref 38–126)
Anion gap: 12 (ref 5–15)
BUN: 45 mg/dL — ABNORMAL HIGH (ref 8–23)
CO2: 25 mmol/L (ref 22–32)
Calcium: 8.8 mg/dL — ABNORMAL LOW (ref 8.9–10.3)
Chloride: 97 mmol/L — ABNORMAL LOW (ref 98–111)
Creatinine, Ser: 2.55 mg/dL — ABNORMAL HIGH (ref 0.61–1.24)
GFR calc Af Amer: 28 mL/min — ABNORMAL LOW (ref >60)
GFR calc non Af Amer: 24 mL/min — ABNORMAL LOW (ref >60)
Glucose, Bld: 154 mg/dL — ABNORMAL HIGH (ref 70–99)
Potassium: 3.8 mmol/L (ref 3.5–5.1)
Sodium: 134 mmol/L — ABNORMAL LOW (ref 135–145)
Total Bilirubin: 0.9 mg/dL (ref 0.3–1.2)
Total Protein: 5.8 g/dL — ABNORMAL LOW (ref 6.5–8.1)

## 2020-04-25 MED ORDER — SODIUM CHLORIDE 0.9 % IV SOLN: Freq: Once | INTRAVENOUS | Status: AC

## 2020-04-25 MED ORDER — HEPARIN SOD (PORK) LOCK FLUSH 100 UNIT/ML IV SOLN
500.0000 [IU] | Freq: Once | INTRAVENOUS | Status: AC
  Administered 2020-04-25: 500 [IU] via INTRAVENOUS

## 2020-04-25 MED ORDER — SODIUM CHLORIDE 0.9% FLUSH
10.0000 mL | Freq: Once | INTRAVENOUS | Status: AC
  Administered 2020-04-25: 10 mL

## 2020-04-25 NOTE — Progress Notes (Signed)
Patient presents today for treatment. Vital signs are stable. Labs pending. Patient denies any pain today. Patient states he fell over the weekend. Patient tripped over a shoe his dog placed in the living room. Patient did not seek medical care after the fall. Patient denies any nausea or vomiting Patient states his appetite has decreased with slight alteration in his eating habits. Patient denies any chest pain, cough, or fevers over the weekend.  Labs reviewed with RLockamy NP.  Verbal orders received NO treatment today. Patient to return next week on Tuesday for treatment. Patient instructed to hold Pomalyst. 500 mls Normal Saline over 1 hour.   Hydration given today per MD orders. Tolerated infusion without adverse affects. Vital signs stable. No complaints at this time. Discharged from clinic via wheel chair. F/U with [facility] as scheduled.

## 2020-04-25 NOTE — Progress Notes (Signed)
CRITICAL VALUE ALERT Critical value received:  WBC Date of notification:  04/25/20 Time of notification: 0735 Critical value read back:  YES Nurse who received alert:  TWJackson RN MD notified time and response:  RLockamy NP [phone] Waiting on further lab results: see progress note

## 2020-04-25 NOTE — Patient Instructions (Signed)
[place] at [facility]  Discharge Instructions:  Do not take Pomalyst until after appointment with Dr. Delton Coombes on 05/03/20.  _______________________________________________________________  Thank you for choosing [facility] to provide your oncology and hematology care.  To afford each patient quality time with our providers, please arrive at least 15 minutes before your scheduled appointment.  You need to re-schedule your appointment if you arrive 10 or more minutes late.  We strive to give you quality time with our providers, and arriving late affects you and other patients whose appointments are after yours.  Also, if you no show three or more times for appointments you may be dismissed from the clinic.  Again, thank you for choosing [facility] hope is that these requests will allow you access to exceptional care and in a timely manner. _______________________________________________________________  If you have questions after your visit, please contact our office at [phone] between the hours of 8:30 a.m. and 5:00 p.m. Voicemails left after 4:30 p.m. will not be returned until the following business day. _______________________________________________________________  For prescription refill requests, have your pharmacy contact our office. _______________________________________________________________  Recommendations made by the consultant and any test results will be sent to your referring physician. _______________________________________________________________

## 2020-04-25 NOTE — Patient Instructions (Signed)
[facility] at [facility] Discharge Instructions  Labs drawn from portacath today   Thank you for choosing [facility] at [facility] to provide your oncology and hematology care.  To afford each patient quality time with our provider, please arrive at least 15 minutes before your scheduled appointment time.   If you have a lab appointment with the Cancer Center please come in thru the Main Entrance and check in at the main information desk.  You need to re-schedule your appointment should you arrive 10 or more minutes late.  We strive to give you quality time with our providers, and arriving late affects you and other patients whose appointments are after yours.  Also, if you no show three or more times for appointments you may be dismissed from the clinic at the providers discretion.     Again, thank you for choosing [facility].  Our hope is that these requests will decrease the amount of time that you wait before being seen by our physicians.       _____________________________________________________________  Should you have questions after your visit to [facility], please contact our office at [phone] between the hours of 8:00 a.m. and 4:30 p.m.  Voicemails left after 4:00 p.m. will not be returned until the following business day.  For prescription refill requests, have your pharmacy contact our office and allow 72 hours.    Due to Covid, you will need to wear a mask upon entering the hospital. If you do not have a mask, a mask will be given to you at the Main Entrance upon arrival. For doctor visits, patients may have 1 support person with them. For treatment visits, patients can not have anyone with them due to social distancing guidelines and our immunocompromised population.     

## 2020-04-27 ENCOUNTER — Other Ambulatory Visit (HOSPITAL_COMMUNITY): Payer: Self-pay | Admitting: *Deleted

## 2020-04-27 DIAGNOSIS — C9000 Multiple myeloma not having achieved remission: Secondary | ICD-10-CM

## 2020-04-27 MED ORDER — POMALIDOMIDE 3 MG PO CAPS: 3.0000 mg | ORAL_CAPSULE | Freq: Every day | ORAL | 0 refills | Status: DC

## 2020-04-27 NOTE — Telephone Encounter (Signed)
Per last office note, patient will continue pomalyst.

## 2020-05-03 ENCOUNTER — Ambulatory Visit (HOSPITAL_COMMUNITY): Payer: Medicare Other

## 2020-05-03 ENCOUNTER — Other Ambulatory Visit (HOSPITAL_COMMUNITY): Payer: Medicare Other

## 2020-05-03 ENCOUNTER — Inpatient Hospital Stay (HOSPITAL_COMMUNITY): Payer: Medicare Other | Attending: Hematology

## 2020-05-03 ENCOUNTER — Other Ambulatory Visit: Payer: Self-pay

## 2020-05-03 ENCOUNTER — Inpatient Hospital Stay (HOSPITAL_BASED_OUTPATIENT_CLINIC_OR_DEPARTMENT_OTHER): Payer: Medicare Other | Admitting: Hematology

## 2020-05-03 ENCOUNTER — Ambulatory Visit (HOSPITAL_COMMUNITY): Payer: Medicare Other | Admitting: Hematology

## 2020-05-03 ENCOUNTER — Inpatient Hospital Stay (HOSPITAL_COMMUNITY): Payer: Medicare Other

## 2020-05-03 VITALS — BP 146/73 | HR 104 | Temp 97.3°F | Resp 18

## 2020-05-03 VITALS — BP 128/56 | HR 80 | Temp 97.4°F | Resp 19

## 2020-05-03 DIAGNOSIS — Z5111 Encounter for antineoplastic chemotherapy: Secondary | ICD-10-CM | POA: Insufficient documentation

## 2020-05-03 DIAGNOSIS — E1122 Type 2 diabetes mellitus with diabetic chronic kidney disease: Secondary | ICD-10-CM | POA: Insufficient documentation

## 2020-05-03 DIAGNOSIS — N189 Chronic kidney disease, unspecified: Secondary | ICD-10-CM | POA: Diagnosis not present

## 2020-05-03 DIAGNOSIS — C9000 Multiple myeloma not having achieved remission: Secondary | ICD-10-CM

## 2020-05-03 DIAGNOSIS — D649 Anemia, unspecified: Secondary | ICD-10-CM | POA: Diagnosis not present

## 2020-05-03 DIAGNOSIS — G4700 Insomnia, unspecified: Secondary | ICD-10-CM | POA: Diagnosis not present

## 2020-05-03 DIAGNOSIS — Z923 Personal history of irradiation: Secondary | ICD-10-CM | POA: Diagnosis not present

## 2020-05-03 DIAGNOSIS — Z79899 Other long term (current) drug therapy: Secondary | ICD-10-CM | POA: Diagnosis not present

## 2020-05-03 DIAGNOSIS — Z86718 Personal history of other venous thrombosis and embolism: Secondary | ICD-10-CM | POA: Diagnosis not present

## 2020-05-03 DIAGNOSIS — Z7901 Long term (current) use of anticoagulants: Secondary | ICD-10-CM | POA: Diagnosis not present

## 2020-05-03 LAB — VITAMIN B12: Vitamin B-12: 282 pg/mL (ref 180–914)

## 2020-05-03 LAB — COMPREHENSIVE METABOLIC PANEL
ALT: 25 U/L (ref 0–44)
AST: 12 U/L — ABNORMAL LOW (ref 15–41)
Albumin: 2.9 g/dL — ABNORMAL LOW (ref 3.5–5.0)
Alkaline Phosphatase: 137 U/L — ABNORMAL HIGH (ref 38–126)
Anion gap: 13 (ref 5–15)
BUN: 35 mg/dL — ABNORMAL HIGH (ref 8–23)
CO2: 25 mmol/L (ref 22–32)
Calcium: 8.4 mg/dL — ABNORMAL LOW (ref 8.9–10.3)
Chloride: 96 mmol/L — ABNORMAL LOW (ref 98–111)
Creatinine, Ser: 2.79 mg/dL — ABNORMAL HIGH (ref 0.61–1.24)
GFR calc Af Amer: 25 mL/min — ABNORMAL LOW (ref >60)
GFR calc non Af Amer: 22 mL/min — ABNORMAL LOW (ref >60)
Glucose, Bld: 243 mg/dL — ABNORMAL HIGH (ref 70–99)
Potassium: 4.1 mmol/L (ref 3.5–5.1)
Sodium: 134 mmol/L — ABNORMAL LOW (ref 135–145)
Total Bilirubin: 0.7 mg/dL (ref 0.3–1.2)
Total Protein: 5.8 g/dL — ABNORMAL LOW (ref 6.5–8.1)

## 2020-05-03 LAB — CBC WITH DIFFERENTIAL/PLATELET
Abs Immature Granulocytes: 0.02 10*3/uL (ref 0.00–0.07)
Basophils Absolute: 0 10*3/uL (ref 0.0–0.1)
Basophils Relative: 1 %
Eosinophils Absolute: 0 10*3/uL (ref 0.0–0.5)
Eosinophils Relative: 1 %
HCT: 23.2 % — ABNORMAL LOW (ref 39.0–52.0)
Hemoglobin: 7.5 g/dL — ABNORMAL LOW (ref 13.0–17.0)
Immature Granulocytes: 1 %
Lymphocytes Relative: 27 %
Lymphs Abs: 0.7 10*3/uL (ref 0.7–4.0)
MCH: 33.5 pg (ref 26.0–34.0)
MCHC: 32.3 g/dL (ref 30.0–36.0)
MCV: 103.6 fL — ABNORMAL HIGH (ref 80.0–100.0)
Monocytes Absolute: 0.4 10*3/uL (ref 0.1–1.0)
Monocytes Relative: 16 %
Neutro Abs: 1.4 10*3/uL — ABNORMAL LOW (ref 1.7–7.7)
Neutrophils Relative %: 54 %
Platelets: 97 10*3/uL — ABNORMAL LOW (ref 150–400)
RBC: 2.24 MIL/uL — ABNORMAL LOW (ref 4.22–5.81)
RDW: 14.6 % (ref 11.5–15.5)
WBC: 2.5 10*3/uL — ABNORMAL LOW (ref 4.0–10.5)
nRBC: 0.8 % — ABNORMAL HIGH (ref 0.0–0.2)

## 2020-05-03 LAB — IRON AND TIBC
Iron: 52 ug/dL (ref 45–182)
Saturation Ratios: 21 % (ref 17.9–39.5)
TIBC: 244 ug/dL — ABNORMAL LOW (ref 250–450)
UIBC: 192 ug/dL

## 2020-05-03 LAB — BPAM RBC
Blood Product Expiration Date: 202107222359
Unit Type and Rh: 9500

## 2020-05-03 LAB — PREPARE RBC (CROSSMATCH)

## 2020-05-03 LAB — FOLATE: Folate: 22.9 ng/mL (ref >5.9)

## 2020-05-03 LAB — FERRITIN: Ferritin: 732 ng/mL — ABNORMAL HIGH (ref 24–336)

## 2020-05-03 LAB — LACTATE DEHYDROGENASE: LDH: 121 U/L (ref 98–192)

## 2020-05-03 MED ORDER — DIPHENHYDRAMINE HCL 25 MG PO CAPS
50.0000 mg | ORAL_CAPSULE | Freq: Once | ORAL | Status: AC
  Administered 2020-05-03: 50 mg via ORAL
  Filled 2020-05-03: qty 2

## 2020-05-03 MED ORDER — SODIUM CHLORIDE 0.9% IV SOLUTION
250.0000 mL | Freq: Once | INTRAVENOUS | Status: AC
  Administered 2020-05-03: 250 mL via INTRAVENOUS

## 2020-05-03 MED ORDER — ACETAMINOPHEN 325 MG PO TABS
650.0000 mg | ORAL_TABLET | Freq: Once | ORAL | Status: AC
  Administered 2020-05-03: 650 mg via ORAL
  Filled 2020-05-03: qty 2

## 2020-05-03 MED ORDER — SODIUM CHLORIDE 0.9 % IV SOLN
20.0000 mg | Freq: Once | INTRAVENOUS | Status: AC
  Administered 2020-05-03: 20 mg via INTRAVENOUS
  Filled 2020-05-03: qty 20

## 2020-05-03 MED ORDER — SODIUM CHLORIDE 0.9 % IV SOLN
16.0000 mg/kg | Freq: Once | INTRAVENOUS | Status: AC
  Administered 2020-05-03: 1600 mg via INTRAVENOUS
  Filled 2020-05-03: qty 80

## 2020-05-03 MED ORDER — FAMOTIDINE IN NACL 20-0.9 MG/50ML-% IV SOLN
20.0000 mg | Freq: Once | INTRAVENOUS | Status: AC
  Administered 2020-05-03: 20 mg via INTRAVENOUS
  Filled 2020-05-03: qty 50

## 2020-05-03 MED ORDER — SODIUM CHLORIDE 0.9% FLUSH
10.0000 mL | INTRAVENOUS | Status: DC | PRN
  Administered 2020-05-03: 10 mL

## 2020-05-03 MED ORDER — SODIUM CHLORIDE 0.9 % IV SOLN: Freq: Once | INTRAVENOUS | Status: AC

## 2020-05-03 MED ORDER — HEPARIN SOD (PORK) LOCK FLUSH 100 UNIT/ML IV SOLN
500.0000 [IU] | Freq: Once | INTRAVENOUS | Status: AC | PRN
  Administered 2020-05-03: 500 [IU]

## 2020-05-03 NOTE — Patient Instructions (Signed)
[place] at [facility] Discharge Instructions  You were seen today by Dr. Delton Coombes. He went over your recent lab results. He will see you back in 2 weeks for labs and follow up. Continue holding your pomalyst and do not take until you see Dr. Delton Coombes.   Thank you for choosing [place] at [facility] to provide your oncology and hematology care.  To afford each patient quality time with our provider, please arrive at least 15 minutes before your scheduled appointment time.   If you have a lab appointment with the [facility] please come in thru the  Main Entrance and check in at the main information desk  You need to re-schedule your appointment should you arrive 10 or more minutes late.  We strive to give you quality time with our providers, and arriving late affects you and other patients whose appointments are after yours.  Also, if you no show three or more times for appointments you may be dismissed from the clinic at the providers discretion.     Again, thank you for choosing [facility].  Our hope is that these requests will decrease the amount of time that you wait before being seen by our physicians.       _____________________________________________________________  Should you have questions after your visit to [facility], please contact our office at [phone] between the hours of 8:00 a.m. and 4:30 p.m.  Voicemails left after 4:00 p.m. will not be returned until the following business day.  For prescription refill requests, have your pharmacy contact our office and allow 72 hours.    Cancer Center Support Programs:   > Cancer Support Group  2nd Tuesday of the month 1pm-2pm, Journey Room

## 2020-05-03 NOTE — Patient Instructions (Signed)
[facility] Discharge Instructions for Patients Receiving Chemotherapy  Today you received the following chemotherapy agents   To help prevent nausea and vomiting after your treatment, we encourage you to take your nausea medication   If you develop nausea and vomiting that is not controlled by your nausea medication, call the clinic.   BELOW ARE SYMPTOMS THAT SHOULD BE REPORTED IMMEDIATELY:  *FEVER GREATER THAN 100.5 F  *CHILLS WITH OR WITHOUT FEVER  NAUSEA AND VOMITING THAT IS NOT CONTROLLED WITH YOUR NAUSEA MEDICATION  *UNUSUAL SHORTNESS OF BREATH  *UNUSUAL BRUISING OR BLEEDING  TENDERNESS IN MOUTH AND THROAT WITH OR WITHOUT PRESENCE OF ULCERS  *URINARY PROBLEMS  *BOWEL PROBLEMS  UNUSUAL RASH Items with * indicate a potential emergency and should be followed up as soon as possible.  Feel free to call the clinic should you have any questions or concerns. The clinic phone number is [phone].  Please show the CHEMO ALERT CARD at check-in to the Emergency Department and triage nurse.   

## 2020-05-03 NOTE — Progress Notes (Signed)
Tom [place], [place] [id number]   CLINIC:  Medical Oncology/Hematology  PCP:  Tom Noble, Marshall [address] / [place] [address] [phone]   REASON FOR VISIT:  Follow-up for multiple myeloma  PRIOR THERAPY: Transplant followed by Revlimid maintenance  CURRENT THERAPY: Darzalex  BRIEF ONCOLOGIC HISTORY:  Oncology History  Multiple myeloma ([place])  08/29/2017 Initial Diagnosis   Multiple myeloma ([place])   12/10/2017 - 03/31/2018 Chemotherapy   The patient had bortezomib SQ (VELCADE) chemo injection 2.75 mg, 1.3 mg/m2 = 2.75 mg, Subcutaneous,  Once, 5 of 5 cycles Administration: 2.75 mg (12/10/2017), 2.75 mg (12/17/2017), 2.75 mg (12/31/2017), 2.75 mg (01/21/2018), 2.75 mg (02/18/2018), 2.75 mg (03/11/2018)  for chemotherapy treatment.    04/11/2020 -  Chemotherapy   The patient had daratumumab (DARZALEX) 800 mg in sodium chloride 0.9 % 960 mL (0.8 mg/mL) chemo infusion, 8 mg/kg = 800 mg (100 % of original dose 8 mg/kg), Intravenous, Once, 1 of 1 cycle Dose modification: 8 mg/kg (original dose 8 mg/kg, Cycle 1, Reason: Provider Judgment, Comment: giving total dose 20m/kg over 2 days) daratumumab (DARZALEX) 800 mg in sodium chloride 0.9 % 460 mL (1.6 mg/mL) chemo infusion, 8 mg/kg = 800 mg (50 % of original dose 16 mg/kg), Intravenous, Once, 1 of 7 cycles Dose modification: 8 mg/kg (original dose 16 mg/kg, Cycle 1, Reason: Provider Judgment, Comment: giving 16 mg/kg over 2 days. thus 8 mg/kg in 2 divided doses.) Administration: 1,600 mg (04/19/2020), 800 mg (04/11/2020), 800 mg (04/12/2020)  for chemotherapy treatment.      CANCER STAGING: Cancer Staging No matching staging information was found for the patient.  INTERVAL HISTORY:  Tom Marshall a 69y.o. male, returns for routine follow-up and consideration for next cycle of chemotherapy. [name] last seen on 04/11/2020.  Due for day #15 of cycle #1 of Darzalex today.   Today  he reports having fatigue and dry cough. He stopped taking Pomalyst last week until today's evaluation. He denies any bleeding and is still taking Eliquis and Decadron. His leg swelling is coming down since he is taking his Lasix daily. Otherwise he denies having any issues with the shots or treatments from last time.  Overall, he feels ready for next cycle of chemo today.    REVIEW OF SYSTEMS:  Review of Systems  Constitutional: Positive for appetite change (mild) and fatigue (severe).  HENT:   Negative for nosebleeds.   Respiratory: Positive for cough (dry).   Cardiovascular: Positive for leg swelling (decreasing).  Gastrointestinal: Negative for blood in stool.  Genitourinary: Negative for hematuria.   Musculoskeletal: Negative for back pain.  Neurological: Positive for numbness (hands & feet).  All other systems reviewed and are negative.   PAST MEDICAL/SURGICAL HISTORY:  Past Medical History:  Diagnosis Date  . Cancer ([place]    multiple myeloma  . Cancer of right kidney ([facility]   . Cervical dystonia   . Diabetes mellitus without complication ([facility]   . Gout   . Hypercholesteremia    Past Surgical History:  Procedure Laterality Date  . BONE MARROW BIOPSY    . CHOLECYSTECTOMY  2007  . COLONOSCOPY WITH PROPOFOL N/A 01/16/2018   Procedure: COLONOSCOPY WITH PROPOFOL;  Surgeon: Tom Dolin [place];  Location: [facility] ENDO SU[facility];  Service: Endoscopy;  Laterality: N/A;  1:45pm  . ESOPHAGOGASTRODUODENOSCOPY (EGD) WITH PROPOFOL N/A 01/16/2018   Procedure: ESOPHAGOGASTRODUODENOSCOPY (EGD) WITH PROPOFOL;  Surgeon: Tom Dolin [place];  Location: [facility] ENDO  SUITE;  Service: Endoscopy;  Laterality: N/A;  . GIVENS CAPSULE STUDY N/A 05/12/2018   Procedure: GIVENS CAPSULE STUDY;  Surgeon: Tom Marshall;  Location: [facility] ENDO SUITE;  Service: Endoscopy;  Laterality: N/A;  7:30am  . NEPHRECTOMY Right 1998   cancer  . PORTACATH PLACEMENT Right 04/07/2020   Procedure: INSERTION PORT-A-CATH (attached  catherter in right internal jugular);  Surgeon: Tom Cagey, [place];  Location: [facility] ORS;  Service: General;  Laterality: Right;    SOCIAL HISTORY:  Social History   Socioeconomic History  . Marital status: Single    Spouse name: Not on file  . Number of children: Not on file  . Years of education: Not on file  . Highest education level: Not on file  Occupational History  . Occupation: Marine scientist, Insurance underwriter  Tobacco Use  . Smoking status: Never Smoker  . Smokeless tobacco: Former Systems developer    Types: Secondary school teacher  . Vaping Use: Never used  Substance and Sexual Activity  . Alcohol use: No  . Drug use: No  . Sexual activity: Not Currently  Other Topics Concern  . Not on file  Social History Narrative  . Not on file   Social Determinants of Health   Financial Resource Strain:   . Difficulty of Paying Living Expenses:   Food Insecurity:   . Worried About Charity fundraiser in the Last Year:   . Arboriculturist in the Last Year:   Transportation Needs:   . Film/video editor (Medical):   Tom [place] Kitchen Lack of Transportation (Non-Medical):   Physical Activity:   . Days of Exercise per Week:   . Minutes of Exercise per Session:   Stress:   . Feeling of Stress :   Social Connections:   . Frequency of Communication with Friends and Family:   . Frequency of Social Gatherings with Friends and Family:   . Attends Religious Services:   . Active Member of Clubs or Organizations:   . Attends Archivist Meetings:   Tom [place] Kitchen Marital Status:   Intimate Partner Violence:   . Fear of Current or Ex-Partner:   . Emotionally Abused:   Tom [place] Kitchen Physically Abused:   . Sexually Abused:     FAMILY HISTORY:  Family History  Problem Relation Age of Onset  . Heart failure Mother 62  . Dementia Father   . Colon cancer Neg Hx     CURRENT MEDICATIONS:  Current Outpatient Medications  Medication Sig Dispense Refill  . acyclovir (ZOVIRAX) 400 MG tablet Take 0.5 tablets (200 mg total) by  mouth 2 (two) times daily. 60 tablet 4  . allopurinol (ZYLOPRIM) 300 MG tablet Take 300 mg by mouth every morning.    . B-D UF III MINI PEN NEEDLES 31G X 5 MM MISC   4  . DARATUMUMAB IV Inject into the vein. Weeks 1-8 weekly x 8 doses; then every 2 weeks x 8 doses; then every 28 days    . dexamethasone (DECADRON) 4 MG tablet Take 2.5 tablets (10 mg total) by mouth once a week. 30 tablet 2  . ELIQUIS 2.5 MG TABS tablet TAKE 1 TABLET BY MOUTH TWICE DAILY AFTER COMPLETION OF THE STARTER PACK. (Patient taking differently: Take 2.5 mg by mouth daily. ) 60 tablet 11  . furosemide (LASIX) 40 MG tablet Take 40 mg by mouth every Monday, Wednesday, and Friday.     . gabapentin (NEURONTIN) 300 MG capsule TAKE 1 CAPSULE BY MOUTH AT BEDTIME (  Patient taking differently: Take 300 mg by mouth at bedtime. ) 30 capsule 1  . glipiZIDE (GLUCOTROL XL) 5 MG 24 hr tablet Take 5 mg by mouth daily with breakfast.     . Multiple Vitamins-Minerals (CENTRUM SILVER 50+MEN) TABS Take 1 tablet by mouth every morning.    . pravastatin (PRAVACHOL) 20 MG tablet Take 20 mg by mouth at bedtime.     . sodium bicarbonate 650 MG tablet Take 1 tablet (650 mg total) by mouth 2 (two) times daily. (Patient taking differently: Take 1,300 mg by mouth 2 (two) times daily. ) 60 tablet 1  . traZODone (DESYREL) 100 MG tablet Take 1 tablet (100 mg total) by mouth at bedtime. 30 tablet 3  . TRESIBA FLEXTOUCH 200 UNIT/ML SOPN Inject 44 Units into the skin daily.     . TRUE METRIX BLOOD GLUCOSE TEST test strip     . cyclobenzaprine (FLEXERIL) 10 MG tablet Take 1 tablet (10 mg total) by mouth 3 (three) times daily as needed for muscle spasms. (Patient not taking: Reported on 05/03/2020) 30 tablet 1  . dicyclomine (BENTYL) 10 MG capsule Take 10 mg by mouth 2 (two) times daily as needed. (Patient not taking: Reported on 05/03/2020)    . HYDROcodone-acetaminophen (NORCO/VICODIN) 5-325 MG tablet Take 1 tablet by mouth every 4 (four) hours as needed for severe  pain. (Patient not taking: Reported on 05/03/2020) 5 tablet 0  . lidocaine-prilocaine (EMLA) cream Apply small amount over port site 1 hour prior to appointment.  Cover with plastic wrap. (Patient not taking: Reported on 05/03/2020) 30 g 3  . pomalidomide (POMALYST) 3 MG capsule Take 1 capsule (3 mg total) by mouth daily. Take with water on days 1-21. Repeat every 28 days. (Patient not taking: Reported on 05/03/2020) 21 capsule 0  . prochlorperazine (COMPAZINE) 10 MG tablet Take 1 tablet (10 mg total) by mouth every 6 (six) hours as needed (Nausea or vomiting). (Patient not taking: Reported on 05/03/2020) 30 tablet 1   No current facility-administered medications for this visit.   Facility-Administered Medications Ordered in Other Visits  Medication Dose Route Frequency Provider Last Rate Last Admin  . 0.9 %  sodium chloride infusion  250 mL Intravenous Once Twana First, [place]        ALLERGIES:  No Known Allergies  PHYSICAL EXAM:  Performance status (ECOG): 1 - Symptomatic but completely ambulatory  Vitals:   05/03/20 0804  BP: (!) 146/73  Pulse: (!) 104  Resp: 18  Temp: (!) 97.3 F (36.3 C)  SpO2: 90%   Wt Readings from Last 3 Encounters:  04/25/20 98.2 kg (216 lb 6.4 oz)  04/18/20 99.7 kg (219 lb 11.2 oz)  04/12/20 100.9 kg (222 lb 6.4 oz)   Physical Exam Vitals reviewed.  Constitutional:      Appearance: Normal appearance. He is obese.  Cardiovascular:     Rate and Rhythm: Normal rate and regular rhythm.     Pulses: Normal pulses.     Heart sounds: Normal heart sounds.  Pulmonary:     Effort: Pulmonary effort is normal.     Breath sounds: Normal breath sounds.  Abdominal:     Palpations: Abdomen is soft.     Tenderness: There is no abdominal tenderness.  Musculoskeletal:     Right lower leg: Edema (1+) present.     Left lower leg: Edema (1+) present.  Neurological:     General: No focal deficit present.     Mental Status: He is alert and  oriented to person, place, and  time.  Psychiatric:        Mood and Affect: Mood normal.        Behavior: Behavior normal.     LABORATORY DATA:  I have reviewed the labs as listed.  CBC Latest Ref Rng & Units 05/03/2020 04/25/2020 04/18/2020  WBC 4.0 - 10.5 K/uL 2.5(L) 1.1(LL) 2.6(L)  Hemoglobin 13.0 - 17.0 g/dL 7.5(L) 8.5(L) 8.4(L)  Hematocrit 39 - 52 % 23.2(L) 26.1(L) 26.1(L)  Platelets 150 - 400 K/uL 97(L) 34(L) 49(L)   CMP Latest Ref Rng & Units 05/03/2020 04/25/2020 04/18/2020  Glucose 70 - 99 mg/dL 243(H) 154(H) 244(H)  BUN 8 - 23 mg/dL 35(H) 45(H) 52(H)  Creatinine 0.61 - 1.24 mg/dL 2.79(H) 2.55(H) 2.90(H)  Sodium 135 - 145 mmol/L 134(L) 134(L) 134(L)  Potassium 3.5 - 5.1 mmol/L 4.1 3.8 4.2  Chloride 98 - 111 mmol/L 96(L) 97(L) 99  CO2 22 - 32 mmol/L '25 25 24  ' Calcium 8.9 - 10.3 mg/dL 8.4(L) 8.8(L) 8.1(L)  Total Protein 6.5 - 8.1 g/dL 5.8(L) 5.8(L) 5.5(L)  Total Bilirubin 0.3 - 1.2 mg/dL 0.7 0.9 1.0  Alkaline Phos 38 - 126 U/L 137(H) 111 94  AST 15 - 41 U/L 12(L) 9(L) 20  ALT 0 - 44 U/L 25 27 37   Lab Results  Component Value Date   TOTALPROTELP 4.7 (L) 04/11/2020   ALBUMINELP 3.1 04/11/2020   A1GS 0.1 04/11/2020   A2GS 0.5 04/11/2020   BETS 0.6 (L) 04/11/2020   GAMS 0.4 04/11/2020   MSPIKE Not Observed 04/11/2020   SPEI Comment 04/11/2020    Lab Results  Component Value Date   KPAFRELGTCHN 1,058.0 (H) 04/11/2020   LAMBDASER 8.1 04/11/2020   KAPLAMBRATIO 130.62 (H) 04/11/2020    DIAGNOSTIC IMAGING:  I have independently reviewed the scans and discussed with the patient. DG Chest 2 View  Result Date: 04/18/2020 CLINICAL DATA:  Shortness of breath EXAM: CHEST - 2 VIEW COMPARISON:  04/07/2020 FINDINGS: RIGHT jugular Port-A-Cath tip projecting over SVC. Normal heart size mediastinal contours. Atelectasis LEFT base. Minimal scarring RIGHT mid lung likely related to adjacent rib deformity, unchanged. No additional infiltrate, pleural effusion or pneumothorax. Osseous demineralization with old BILATERAL  rib fractures and thoracolumbar scoliosis. IMPRESSION: LEFT basilar atelectasis and RIGHT mid lung scarring. Electronically Signed   By: Lavonia Dana M.D.   On: 04/18/2020 15:30   NM Pulmonary Perfusion  Result Date: 04/18/2020 CLINICAL DATA:  Shortness of breath, history of DVT, multiple myeloma, diabetes mellitus EXAM: NUCLEAR MEDICINE PERFUSION LUNG SCAN TECHNIQUE: Perfusion images were obtained in multiple projections after intravenous injection of radiopharmaceutical. Ventilation scans intentionally deferred if perfusion scan and chest x-ray adequate for interpretation during COVID 19 epidemic. RADIOPHARMACEUTICALS:  1.55 mCi Tc-88mMAA IV COMPARISON:  None Correlation: Chest radiograph 04/18/2020 FINDINGS: Focus of residual tracer within reservoir of RIGHT jugular Port-A-Cath related to injection. Perfusion defect LEFT lower lobe corresponding to atelectasis at LEFT lung base on chest radiograph. Deformity of the lateral mid to lower RIGHT lung on anterior view corresponding to rib deformities on chest radiograph. No other perfusion defects identified. IMPRESSION: Perfusion defect LEFT lower lobe likely related to atelectasis seen on chest radiograph. No other perfusion defects identified. Findings are unlikely to represent pulmonary embolism. Electronically Signed   By: MLavonia DanaM.D.   On: 04/18/2020 14:36   DG Chest Port 1 View  Result Date: 04/07/2020 CLINICAL DATA:  Port-A-Cath placement.  Multiple myeloma EXAM: PORTABLE CHEST 1 VIEW COMPARISON:  July 23, 2017 chest radiograph; PET-CT February 26, 2020 FINDINGS: Port-A-Cath tip is in the superior vena cava. No pneumothorax. There is atelectatic change in the left base. Lungs elsewhere are clear. Heart is mildly enlarged with pulmonary vascular normal. No adenopathy. There is upper to midthoracic levoscoliosis with questionable lytic change in the midthoracic region. IMPRESSION: Port-A-Cath tip in superior vena cava.  No pneumothorax. Left base  atelectasis.  Lungs otherwise clear. Heart mildly enlarged. Electronically Signed   By: Lowella Grip III M.D.   On: 04/07/2020 11:46   DG C-Arm 1-60 Min-No Report  Result Date: 04/07/2020 Fluoroscopy was utilized by the requesting physician.  No radiographic interpretation.     ASSESSMENT:  1. IgG kappa multiple myeloma: -4 cycles of RVD from 08/30/2017 through 02/18/2018. -Declined bone marrow transplant.  Received maintenance Revlimid 2.5 mg 3 weeks on 1 week off. -Bone marrow biopsy on 02/23/2020 shows 21% plasma cells.  Occasional sections had up to 75% myeloma cells. -Chromosome analysis was normal.  FISH panel was positive for gain of 1 q., monosomy 13, del 17 P, t(14;20) -PET scan showed new left frontal destructive lesion.  Numerous areas of lytic changes in the spine.  Lucent areas in T6 with loss of height at T5.  Numerous lytic changes throughout the spine noted along with pedicle and lamina and transverse process of T8 vertebral body.  Profound hypermetabolic activity within the ribs bilaterally.   2.  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 -Completed XRT on 04/05/2020.  3.  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  He is on Eliquis.   PLAN:  1. IgG kappa multiple myeloma: -Myeloma labs from 05/03/2020 shows M spike not observed.  Kappa free light chains at 935 with lambda light chains 11.2 and ratio of 83.53. -He stopped taking Pomalyst last week because of severe tiredness.  He still has some tiredness.  We will continue to hold Pomalyst at this time. -His hemoglobin today 7.5.  Ferritin is 732 and percent saturation is 21.  C37 and folic acid is normal. -We will continue to hold his Pomalyst at this time.  He will proceed with Darzalex  today. -He will receive 1 unit dose of PRBC today.  RTC 2 weeks with labs and treatment  2. CKD: -Creatinine today is 2.79.  Closely monitor  3. Left leg DVT: -Continue Eliquis 2.5 mg daily.  No bleeding issues noted.  4.Diabetes: -Continue Tresiba 30 units daily and glipizide 5 mg daily.  5. Insomnia: -Continue trazodone 100 mg at bedtime as needed.   Orders placed this encounter:  Orders Placed This Encounter  Procedures  . Iron and TIBC  . Ferritin  . Vitamin B12  . Folate     Derek Jack, [place] [phone]   I, Milinda Antis, am acting as a scribe for Dr. Sanda Linger.  I, Derek Jack [place], have reviewed the above documentation for accuracy and completeness, and I agree with the above.

## 2020-05-03 NOTE — Progress Notes (Signed)
Patient presents today for treatment and follow up visit with Dr. Delton Coombes. Labs pending. Vital signs are within parameters for treatment. Patient had no complaints of any significant changes since his last visit. Patient states he has fatigue not relieved by rest. Patient has shortness of breath with no change since his last visit. Patient states he is taking Pomalyst 1 capsule daily as prescribed day 1-21. Patient denies any problems related to the oral chemotherapy.   Treatment given today per MD orders. Tolerated infusion without adverse affects. Vital signs stable. No complaints at this time. Discharged from clinic via wheel chair.  F/U with [facility] as scheduled.

## 2020-05-03 NOTE — Progress Notes (Signed)
Patient has been assessed, vital signs and labs have been reviewed by Dr. Delton Coombes. ANC, Creatinine, LFTs, and Platelets are within treatment parameters per Dr. Delton Coombes. The patient is good to proceed with treatment at this time. He will also need 1 unit PRBC today as well as anemia labs per Dr.Katragadda.

## 2020-05-03 NOTE — Patient Instructions (Signed)
[facility] at [facility] Discharge Instructions  Labs drawn from portacath today   Thank you for choosing  Cancer Center at [facility] to provide your oncology and hematology care.  To afford each patient quality time with our provider, please arrive at least 15 minutes before your scheduled appointment time.   If you have a lab appointment with the Cancer Center please come in thru the Main Entrance and check in at the main information desk.  You need to re-schedule your appointment should you arrive 10 or more minutes late.  We strive to give you quality time with our providers, and arriving late affects you and other patients whose appointments are after yours.  Also, if you no show three or more times for appointments you may be dismissed from the clinic at the providers discretion.     Again, thank you for choosing [facility].  Our hope is that these requests will decrease the amount of time that you wait before being seen by our physicians.       _____________________________________________________________  Should you have questions after your visit to  Cancer Center, please contact our office at [phone] between the hours of 8:00 a.m. and 4:30 p.m.  Voicemails left after 4:00 p.m. will not be returned until the following business day.  For prescription refill requests, have your pharmacy contact our office and allow 72 hours.    Due to Covid, you will need to wear a mask upon entering the hospital. If you do not have a mask, a mask will be given to you at the Main Entrance upon arrival. For doctor visits, patients may have 1 support person with them. For treatment visits, patients can not have anyone with them due to social distancing guidelines and our immunocompromised population.     

## 2020-05-04 ENCOUNTER — Inpatient Hospital Stay (HOSPITAL_COMMUNITY): Payer: Medicare Other

## 2020-05-04 VITALS — BP 133/68 | HR 80 | Temp 97.7°F | Resp 19

## 2020-05-04 DIAGNOSIS — E1122 Type 2 diabetes mellitus with diabetic chronic kidney disease: Secondary | ICD-10-CM | POA: Diagnosis not present

## 2020-05-04 DIAGNOSIS — D649 Anemia, unspecified: Secondary | ICD-10-CM

## 2020-05-04 DIAGNOSIS — Z86718 Personal history of other venous thrombosis and embolism: Secondary | ICD-10-CM | POA: Diagnosis not present

## 2020-05-04 DIAGNOSIS — Z7901 Long term (current) use of anticoagulants: Secondary | ICD-10-CM | POA: Diagnosis not present

## 2020-05-04 DIAGNOSIS — C9000 Multiple myeloma not having achieved remission: Secondary | ICD-10-CM | POA: Diagnosis not present

## 2020-05-04 DIAGNOSIS — Z923 Personal history of irradiation: Secondary | ICD-10-CM | POA: Diagnosis not present

## 2020-05-04 DIAGNOSIS — Z5111 Encounter for antineoplastic chemotherapy: Secondary | ICD-10-CM | POA: Diagnosis not present

## 2020-05-04 LAB — PROTEIN ELECTROPHORESIS, SERUM
A/G Ratio: 1.4 (ref 0.7–1.7)
Albumin ELP: 2.9 g/dL (ref 2.9–4.4)
Alpha-1-Globulin: 0.3 g/dL (ref 0.0–0.4)
Alpha-2-Globulin: 0.8 g/dL (ref 0.4–1.0)
Beta Globulin: 0.7 g/dL (ref 0.7–1.3)
Gamma Globulin: 0.2 g/dL — ABNORMAL LOW (ref 0.4–1.8)
Globulin, Total: 2.1 g/dL — ABNORMAL LOW (ref 2.2–3.9)
Total Protein ELP: 5 g/dL — ABNORMAL LOW (ref 6.0–8.5)

## 2020-05-04 LAB — KAPPA/LAMBDA LIGHT CHAINS
Kappa free light chain: 935.5 mg/L — ABNORMAL HIGH (ref 3.3–19.4)
Kappa, lambda light chain ratio: 83.53 — ABNORMAL HIGH (ref 0.26–1.65)
Lambda free light chains: 11.2 mg/L (ref 5.7–26.3)

## 2020-05-04 MED ORDER — ACETAMINOPHEN 325 MG PO TABS
ORAL_TABLET | ORAL | Status: AC
  Filled 2020-05-04: qty 2

## 2020-05-04 MED ORDER — SODIUM CHLORIDE 0.9% IV SOLUTION
250.0000 mL | Freq: Once | INTRAVENOUS | Status: AC
  Administered 2020-05-04: 250 mL via INTRAVENOUS

## 2020-05-04 MED ORDER — HEPARIN SOD (PORK) LOCK FLUSH 100 UNIT/ML IV SOLN
500.0000 [IU] | Freq: Every day | INTRAVENOUS | Status: AC | PRN
  Administered 2020-05-04: 500 [IU]

## 2020-05-04 MED ORDER — DIPHENHYDRAMINE HCL 25 MG PO CAPS
25.0000 mg | ORAL_CAPSULE | Freq: Once | ORAL | Status: AC
  Administered 2020-05-04: 25 mg via ORAL

## 2020-05-04 MED ORDER — ACETAMINOPHEN 325 MG PO TABS
650.0000 mg | ORAL_TABLET | Freq: Once | ORAL | Status: AC
  Administered 2020-05-04: 650 mg via ORAL

## 2020-05-04 MED ORDER — SODIUM CHLORIDE 0.9% FLUSH
10.0000 mL | INTRAVENOUS | Status: AC | PRN
  Administered 2020-05-04: 10 mL

## 2020-05-04 MED ORDER — DIPHENHYDRAMINE HCL 25 MG PO CAPS
ORAL_CAPSULE | ORAL | Status: AC
  Filled 2020-05-04: qty 1

## 2020-05-04 NOTE — Progress Notes (Signed)
Tolerated transfusion w/o adverse reaction.  Alert, in no distress.  VSS.  Discharged ambulatory.  

## 2020-05-05 LAB — TYPE AND SCREEN
ABO/RH(D): A POS
Antibody Screen: POSITIVE
Unit division: 0

## 2020-05-09 ENCOUNTER — Other Ambulatory Visit: Payer: Self-pay

## 2020-05-09 ENCOUNTER — Ambulatory Visit (HOSPITAL_COMMUNITY): Payer: Medicare Other | Admitting: Hematology

## 2020-05-09 ENCOUNTER — Encounter (HOSPITAL_COMMUNITY): Payer: Self-pay

## 2020-05-09 ENCOUNTER — Inpatient Hospital Stay (HOSPITAL_COMMUNITY): Payer: Medicare Other

## 2020-05-09 VITALS — BP 141/58 | HR 95 | Temp 97.2°F | Resp 20 | Wt 218.8 lb

## 2020-05-09 DIAGNOSIS — Z86718 Personal history of other venous thrombosis and embolism: Secondary | ICD-10-CM | POA: Diagnosis not present

## 2020-05-09 DIAGNOSIS — C9000 Multiple myeloma not having achieved remission: Secondary | ICD-10-CM | POA: Diagnosis not present

## 2020-05-09 DIAGNOSIS — Z923 Personal history of irradiation: Secondary | ICD-10-CM | POA: Diagnosis not present

## 2020-05-09 DIAGNOSIS — E1122 Type 2 diabetes mellitus with diabetic chronic kidney disease: Secondary | ICD-10-CM | POA: Diagnosis not present

## 2020-05-09 DIAGNOSIS — Z5111 Encounter for antineoplastic chemotherapy: Secondary | ICD-10-CM | POA: Diagnosis not present

## 2020-05-09 DIAGNOSIS — Z7901 Long term (current) use of anticoagulants: Secondary | ICD-10-CM | POA: Diagnosis not present

## 2020-05-09 LAB — CBC WITH DIFFERENTIAL/PLATELET
Abs Immature Granulocytes: 0.05 10*3/uL (ref 0.00–0.07)
Basophils Absolute: 0 10*3/uL (ref 0.0–0.1)
Basophils Relative: 1 %
Eosinophils Absolute: 0 10*3/uL (ref 0.0–0.5)
Eosinophils Relative: 0 %
HCT: 26.6 % — ABNORMAL LOW (ref 39.0–52.0)
Hemoglobin: 8.6 g/dL — ABNORMAL LOW (ref 13.0–17.0)
Immature Granulocytes: 1 %
Lymphocytes Relative: 18 %
Lymphs Abs: 0.7 10*3/uL (ref 0.7–4.0)
MCH: 33.2 pg (ref 26.0–34.0)
MCHC: 32.3 g/dL (ref 30.0–36.0)
MCV: 102.7 fL — ABNORMAL HIGH (ref 80.0–100.0)
Monocytes Absolute: 0.3 10*3/uL (ref 0.1–1.0)
Monocytes Relative: 8 %
Neutro Abs: 3 10*3/uL (ref 1.7–7.7)
Neutrophils Relative %: 72 %
Platelets: 120 10*3/uL — ABNORMAL LOW (ref 150–400)
RBC: 2.59 MIL/uL — ABNORMAL LOW (ref 4.22–5.81)
RDW: 15.3 % (ref 11.5–15.5)
WBC: 4.1 10*3/uL (ref 4.0–10.5)
nRBC: 0.7 % — ABNORMAL HIGH (ref 0.0–0.2)

## 2020-05-09 LAB — COMPREHENSIVE METABOLIC PANEL
ALT: 27 U/L (ref 0–44)
AST: 17 U/L (ref 15–41)
Albumin: 3 g/dL — ABNORMAL LOW (ref 3.5–5.0)
Alkaline Phosphatase: 126 U/L (ref 38–126)
Anion gap: 13 (ref 5–15)
BUN: 29 mg/dL — ABNORMAL HIGH (ref 8–23)
CO2: 26 mmol/L (ref 22–32)
Calcium: 8.5 mg/dL — ABNORMAL LOW (ref 8.9–10.3)
Chloride: 97 mmol/L — ABNORMAL LOW (ref 98–111)
Creatinine, Ser: 2.6 mg/dL — ABNORMAL HIGH (ref 0.61–1.24)
GFR calc Af Amer: 28 mL/min — ABNORMAL LOW (ref >60)
GFR calc non Af Amer: 24 mL/min — ABNORMAL LOW (ref >60)
Glucose, Bld: 270 mg/dL — ABNORMAL HIGH (ref 70–99)
Potassium: 3.6 mmol/L (ref 3.5–5.1)
Sodium: 136 mmol/L (ref 135–145)
Total Bilirubin: 0.7 mg/dL (ref 0.3–1.2)
Total Protein: 5.5 g/dL — ABNORMAL LOW (ref 6.5–8.1)

## 2020-05-09 MED ORDER — SODIUM CHLORIDE 0.9 % IV SOLN
20.0000 mg | Freq: Once | INTRAVENOUS | Status: AC
  Administered 2020-05-09: 20 mg via INTRAVENOUS
  Filled 2020-05-09: qty 20

## 2020-05-09 MED ORDER — DIPHENHYDRAMINE HCL 25 MG PO CAPS
ORAL_CAPSULE | ORAL | Status: AC
  Filled 2020-05-09: qty 2

## 2020-05-09 MED ORDER — SODIUM CHLORIDE 0.9 % IV SOLN
16.0000 mg/kg | Freq: Once | INTRAVENOUS | Status: AC
  Administered 2020-05-09: 1600 mg via INTRAVENOUS
  Filled 2020-05-09: qty 80

## 2020-05-09 MED ORDER — ACETAMINOPHEN 325 MG PO TABS
650.0000 mg | ORAL_TABLET | Freq: Once | ORAL | Status: AC
  Administered 2020-05-09: 650 mg via ORAL

## 2020-05-09 MED ORDER — ACETAMINOPHEN 325 MG PO TABS
ORAL_TABLET | ORAL | Status: AC
  Filled 2020-05-09: qty 2

## 2020-05-09 MED ORDER — FAMOTIDINE IN NACL 20-0.9 MG/50ML-% IV SOLN
20.0000 mg | Freq: Once | INTRAVENOUS | Status: AC
  Administered 2020-05-09: 20 mg via INTRAVENOUS

## 2020-05-09 MED ORDER — DIPHENHYDRAMINE HCL 25 MG PO CAPS
50.0000 mg | ORAL_CAPSULE | Freq: Once | ORAL | Status: AC
  Administered 2020-05-09: 50 mg via ORAL

## 2020-05-09 MED ORDER — HEPARIN SOD (PORK) LOCK FLUSH 100 UNIT/ML IV SOLN
500.0000 [IU] | Freq: Once | INTRAVENOUS | Status: AC | PRN
  Administered 2020-05-09: 500 [IU]

## 2020-05-09 MED ORDER — SODIUM CHLORIDE 0.9% FLUSH
10.0000 mL | INTRAVENOUS | Status: DC | PRN
  Administered 2020-05-09: 10 mL

## 2020-05-09 MED ORDER — SODIUM CHLORIDE 0.9 % IV SOLN: Freq: Once | INTRAVENOUS | Status: AC

## 2020-05-09 MED ORDER — FAMOTIDINE IN NACL 20-0.9 MG/50ML-% IV SOLN
INTRAVENOUS | Status: AC
  Filled 2020-05-09: qty 50

## 2020-05-09 NOTE — Progress Notes (Addendum)
°   05/09/20 1520  What Happened  Was fall witnessed? No  Was patient injured? Yes  Patient found on floor  Found by Staff-comment Isidoro Donning RN)  Stated prior activity ambulating-unassisted  Follow Up  MD notified  (Dr. Delton Coombes)  Time MD notified [phone]  Family notified No - patient refusal (patient didn't refuse - pt is a&o)  Additional tests No  Simple treatment Dressing (steri strips x 3 to 1 in skin tear above right eyebrow)  Adult Fall Risk Assessment  Risk Factor Category (scoring not indicated) Not Applicable  Age 69  Fall History: Fall within 6 months prior to admission 0  Elimination; Bowel and/or Urine Incontinence 0  Elimination; Bowel and/or Urine Urgency/Frequency 0  Medications: includes PCA/Opiates, Anti-convulsants, Anti-hypertensives, Diuretics, Hypnotics, Laxatives, Sedatives, and Psychotropics 5  Patient Care Equipment 0  Mobility-Assistance 0  Mobility-Gait 0  Mobility-Sensory Deficit 0  Altered awareness of immediate physical environment 0  Impulsiveness 0  Lack of understanding of one's physical/cognitive limitations 0  Total Score 7  Patient Fall Risk Level Moderate fall risk  Adult Fall Risk Interventions  Required Bundle Interventions *See Row Information* High fall risk - low, moderate, and high requirements implemented (post fall implemented)  Additional Interventions Other (Comment) (wheelchair when leaving facility)  Vitals  BP (!) 146/51  BP Location Left Arm  Patient Position (if appropriate) Lying  Pulse Rate 98  Resp 20   Patient was leaving the facility (self ambulatory) post chemo @ 1520 carrying his lunch box. In the hallway, his feet got crossed up and patient went face first to the ground. He placed his hands out to catch himself. Fall was partially witnessed by me. He was found in prone position. Patient was alert and oriented x 3 the entire time pre and post fall and denied pain, discomfort, or dizziness. Patient was turned to supine  position and vital signs were obtained - see flowsheet (all WNL). Dr. Delton Coombes in to see patient and he concurred that patient was a&o x 3. Patient continued to deny pain or discomfort. Dr. Raliegh Ip agreed with tx plan to apply steri strips and that he could go home. He also instructed the patient to come to the ER if he began to experience pain, dizziness, or swelling/bruising (patient on blood thinner). Patient verbalized understanding of these instructions. Patient had a vertical skin tear 1 inch in length above the right eyebrow that was cleaned with normal saline and gauze, then 3 steri strips applied. Patient had a .25 inch skin tear with a 1cm in diameter purple bruise underneath the skin tear to right hand. Area cleaned with normal saline and gauze, 2 steri strips applied. Vital signs taken again prior to discharge 97.2, HR 95, R 20, BP 141/58, oxygen sat 97%. Patient was wheeled to his car and discharged at his vehicle after being sure patient was in his vehicle safely. Patient instructed that it would be best to ride in the wheelchair moving forward when coming for treatment. He said that Peter Congo, Chartered certified accountant, did try to get him to ride in the wheelchair on the way out. Patient arrived without ambulatory aid present and drove himself to the appointment. Patient instructed about home safety tips and to use his cane that he has at home to stabilize him.

## 2020-05-09 NOTE — Progress Notes (Signed)
Patient presents today for treatment. Vital signs within parameters for treatment. Labs reviewed by Dr. Delton Coombes. Creatinine 2.60. MD aware. Verbal order to proceed with treatment received. HR elevated on arrival. 111. Recheck 96. Pomalyst held at this time. Dr. Delton Coombes to re-evaluate at next appointment.   Treatment given today per MD orders. Tolerated infusion without adverse affects. Vital signs stable. No complaints at this time. Discharged from clinic ambulatory. F/U with [facility] as scheduled.

## 2020-05-09 NOTE — Patient Instructions (Signed)
[facility] Discharge Instructions for Patients Receiving Chemotherapy  Today you received the following chemotherapy agents   To help prevent nausea and vomiting after your treatment, we encourage you to take your nausea medication   If you develop nausea and vomiting that is not controlled by your nausea medication, call the clinic.   BELOW ARE SYMPTOMS THAT SHOULD BE REPORTED IMMEDIATELY:  *FEVER GREATER THAN 100.5 F  *CHILLS WITH OR WITHOUT FEVER  NAUSEA AND VOMITING THAT IS NOT CONTROLLED WITH YOUR NAUSEA MEDICATION  *UNUSUAL SHORTNESS OF BREATH  *UNUSUAL BRUISING OR BLEEDING  TENDERNESS IN MOUTH AND THROAT WITH OR WITHOUT PRESENCE OF ULCERS  *URINARY PROBLEMS  *BOWEL PROBLEMS  UNUSUAL RASH Items with * indicate a potential emergency and should be followed up as soon as possible.  Feel free to call the clinic should you have any questions or concerns. The clinic phone number is [phone].  Please show the CHEMO ALERT CARD at check-in to the Emergency Department and triage nurse.   

## 2020-05-10 ENCOUNTER — Encounter (HOSPITAL_COMMUNITY): Payer: Self-pay | Admitting: *Deleted

## 2020-05-10 NOTE — Progress Notes (Unsigned)
I called patient today to check on him post fall 05/09/20. Patient is doing fine today. He has been okay since the fall from yesterday. No dizziness, blurred vision, confusion, no new bleeding or bruising. Steri strips are intact to affected areas. Patient has a network of friends in his neighborhood who are checking in on him daily. Patient instructed that if he needs anything to call us, he said okay. He said he is getting a wheeled walker and will either use that when coming into the CC or a wheelchair.

## 2020-05-11 ENCOUNTER — Other Ambulatory Visit: Payer: Self-pay

## 2020-05-11 ENCOUNTER — Ambulatory Visit
Admission: RE | Admit: 2020-05-11 | Discharge: 2020-05-11 | Disposition: A | Discharge status: Home / Self Care | Payer: Medicare Other | Source: Ambulatory Visit | Attending: Urology | Admitting: Urology

## 2020-05-11 ENCOUNTER — Telehealth: Payer: Self-pay

## 2020-05-11 ENCOUNTER — Other Ambulatory Visit (HOSPITAL_COMMUNITY): Payer: Self-pay | Admitting: Hematology

## 2020-05-11 DIAGNOSIS — G9520 Unspecified cord compression: Secondary | ICD-10-CM

## 2020-05-11 DIAGNOSIS — C7951 Secondary malignant neoplasm of bone: Secondary | ICD-10-CM

## 2020-05-11 NOTE — Telephone Encounter (Signed)
Left a voicemail message to remind patient of 1 month follow-up telephone appointment with Ashlyn Brunning PA today at 1:00pm.

## 2020-05-11 NOTE — Progress Notes (Signed)
Radiation Oncology         [phone] ________________________________  Name: Tom Marshall MRN: [id number]  Date: 05/11/2020  DOB: October 04, 1951  Post Treatment Note  CC: Asencion Noble, MD  Asencion Noble, MD  Diagnosis:   69 y.o. 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  Narrative:  I spoke with the patient to conduct his routine scheduled 1 month follow up visit via telephone to spare the patient unnecessary potential exposure in the healthcare setting during the current COVID-19 pandemic.  The patient was notified in advance and gave permission to proceed with this visit format. He tolerated the radiotherapy well without significant ill side effects.                              On review of systems, the patient states he is doing well in general.  He reports significant improvement, almost complete resolution of the discomfort that he was having beneath his right shoulder blade.  He did develop some mild esophagitis towards the end of treatment but reports that that has completely resolved at this point and he is able to eat and drink without difficulty.  Currently, he is without complaints.  He specifically denies any pain or focal weakness.  He has started his new systemic therapy with Dr. Delton Coombes and reports that he is tolerating this well.  He did have a fall recently at the [facility] on Monday, 05/09/2020, following his infusion but reports that he is recovering well from this and has not had any confusion, dizziness/imbalance, blurred vision or bleeding from the contusion/laceration site.  Overall, he is quite pleased with his progress to date.  ALLERGIES:  has No Known Allergies.  Meds: Current Outpatient Medications  Medication Sig Dispense Refill  . acyclovir (ZOVIRAX) 400 MG tablet Take 0.5  tablets (200 mg total) by mouth 2 (two) times daily. 60 tablet 4  . allopurinol (ZYLOPRIM) 300 MG tablet Take 300 mg by mouth every morning.    . B-D UF III MINI PEN NEEDLES 31G X 5 MM MISC   4  . cyclobenzaprine (FLEXERIL) 10 MG tablet Take 1 tablet (10 mg total) by mouth 3 (three) times daily as needed for muscle spasms. 30 tablet 1  . DARATUMUMAB IV Inject into the vein. Weeks 1-8 weekly x 8 doses; then every 2 weeks x 8 doses; then every 28 days    . dexamethasone (DECADRON) 4 MG tablet Take 2.5 tablets (10 mg total) by mouth once a week. 30 tablet 2  . dicyclomine (BENTYL) 10 MG capsule Take 10 mg by mouth 2 (two) times daily as needed.     [name] ELIQUIS 2.5 MG TABS tablet TAKE 1 TABLET BY MOUTH TWICE DAILY AFTER COMPLETION OF THE STARTER PACK. (Patient taking differently: Take 2.5 mg by mouth daily. ) 60 tablet 11  . furosemide (LASIX) 40 MG tablet Take 40 mg by mouth every Monday, Wednesday, and Friday.     . gabapentin (NEURONTIN) 300 MG capsule TAKE 1 CAPSULE BY MOUTH AT BEDTIME (Patient taking differently: Take 300 mg by mouth at bedtime. ) 30 capsule 1  . glipiZIDE (GLUCOTROL XL) 5 MG 24 hr tablet Take 5 mg by mouth daily with breakfast.     . HYDROcodone-acetaminophen (NORCO/VICODIN)  5-325 MG tablet Take 1 tablet by mouth every 4 (four) hours as needed for severe pain. 5 tablet 0  . lidocaine-prilocaine (EMLA) cream Apply small amount over port site 1 hour prior to appointment.  Cover with plastic wrap. 30 g 3  . Multiple Vitamins-Minerals (CENTRUM SILVER 50+MEN) TABS Take 1 tablet by mouth every morning.    . pomalidomide (POMALYST) 3 MG capsule Take 1 capsule (3 mg total) by mouth daily. Take with water on days 1-21. Repeat every 28 days. 21 capsule 0  . pravastatin (PRAVACHOL) 20 MG tablet Take 20 mg by mouth at bedtime.     . prochlorperazine (COMPAZINE) 10 MG tablet Take 1 tablet (10 mg total) by mouth every 6 (six) hours as needed (Nausea or vomiting). 30 tablet 1  . sodium bicarbonate  650 MG tablet Take 1 tablet (650 mg total) by mouth 2 (two) times daily. (Patient taking differently: Take 1,300 mg by mouth 2 (two) times daily. ) 60 tablet 1  . traZODone (DESYREL) 100 MG tablet Take 1 tablet (100 mg total) by mouth at bedtime. 30 tablet 3  . TRESIBA FLEXTOUCH 200 UNIT/ML SOPN Inject 44 Units into the skin daily.     . TRUE METRIX BLOOD GLUCOSE TEST test strip      No current facility-administered medications for this visit.   Facility-Administered Medications Ordered in Other Visits  Medication Dose Route Frequency Provider Last Rate Last Admin  . 0.9 %  sodium chloride infusion  250 mL Intravenous Once Twana First, MD        Physical Findings:  vitals were not taken for this visit.   Karen Kays to assess due to telephone follow-up visit format.  Lab Findings: Lab Results  Component Value Date   WBC 4.1 05/09/2020   HGB 8.6 (L) 05/09/2020   HCT 26.6 (L) 05/09/2020   MCV 102.7 (H) 05/09/2020   PLT 120 (L) 05/09/2020     Radiographic Findings: DG Chest 2 View  Result Date: 04/18/2020 CLINICAL DATA:  Shortness of breath EXAM: CHEST - 2 VIEW COMPARISON:  04/07/2020 FINDINGS: RIGHT jugular Port-A-Cath tip projecting over SVC. Normal heart size mediastinal contours. Atelectasis LEFT base. Minimal scarring RIGHT mid lung likely related to adjacent rib deformity, unchanged. No additional infiltrate, pleural effusion or pneumothorax. Osseous demineralization with old BILATERAL rib fractures and thoracolumbar scoliosis. IMPRESSION: LEFT basilar atelectasis and RIGHT mid lung scarring. Electronically Signed   By: Lavonia Dana M.D.   On: 04/18/2020 15:30   NM Pulmonary Perfusion  Result Date: 04/18/2020 CLINICAL DATA:  Shortness of breath, history of DVT, multiple myeloma, diabetes mellitus EXAM: NUCLEAR MEDICINE PERFUSION LUNG SCAN TECHNIQUE: Perfusion images were obtained in multiple projections after intravenous injection of radiopharmaceutical. Ventilation scans  intentionally deferred if perfusion scan and chest x-ray adequate for interpretation during COVID 19 epidemic. RADIOPHARMACEUTICALS:  1.55 mCi Tc-41mMAA IV COMPARISON:  None Correlation: Chest radiograph 04/18/2020 FINDINGS: Focus of residual tracer within reservoir of RIGHT jugular Port-A-Cath related to injection. Perfusion defect LEFT lower lobe corresponding to atelectasis at LEFT lung base on chest radiograph. Deformity of the lateral mid to lower RIGHT lung on anterior view corresponding to rib deformities on chest radiograph. No other perfusion defects identified. IMPRESSION: Perfusion defect LEFT lower lobe likely related to atelectasis seen on chest radiograph. No other perfusion defects identified. Findings are unlikely to represent pulmonary embolism. Electronically Signed   By: MLavonia DanaM.D.   On: 04/18/2020 14:36    Impression/Plan: 1. 69y.o. patient with T5  spinal cord compression from multiple myeloma. He appears to have recovered well from the effects of his recent radiotherapy and is currently without complaints.  He has noticed significant improvement in the pain that he was having behind his shoulder blade.  He started his new systemic therapy under the care and direction of Dr. Delton Coombes on 04/11/2020 and continues to tolerate this well.  We discussed that while we are happy to continue to participate in his care if clinically indicated, at this point, we will plan to see him back on an as-needed basis.  He will continue in routine follow-up with Dr. Delton Coombes for continued management of his systemic disease.  He knows that he is welcome to call at anytime with any questions or concerns related to his previous radiotherapy.  Nicholos Johns, MMS, PA-C [place] at [place]: [phone]  Fax: [place], PA-C

## 2020-05-16 ENCOUNTER — Inpatient Hospital Stay (HOSPITAL_COMMUNITY): Payer: Medicare Other

## 2020-05-16 ENCOUNTER — Inpatient Hospital Stay (HOSPITAL_BASED_OUTPATIENT_CLINIC_OR_DEPARTMENT_OTHER): Payer: Medicare Other | Admitting: Hematology

## 2020-05-16 ENCOUNTER — Encounter (HOSPITAL_COMMUNITY): Payer: Self-pay | Admitting: Hematology

## 2020-05-16 ENCOUNTER — Other Ambulatory Visit: Payer: Self-pay

## 2020-05-16 VITALS — BP 156/67 | HR 109 | Temp 96.7°F | Resp 20 | Wt 219.9 lb

## 2020-05-16 VITALS — BP 113/63 | HR 81 | Temp 96.8°F | Resp 18

## 2020-05-16 DIAGNOSIS — E1122 Type 2 diabetes mellitus with diabetic chronic kidney disease: Secondary | ICD-10-CM | POA: Diagnosis not present

## 2020-05-16 DIAGNOSIS — Z86718 Personal history of other venous thrombosis and embolism: Secondary | ICD-10-CM | POA: Diagnosis not present

## 2020-05-16 DIAGNOSIS — Z7901 Long term (current) use of anticoagulants: Secondary | ICD-10-CM | POA: Diagnosis not present

## 2020-05-16 DIAGNOSIS — C9000 Multiple myeloma not having achieved remission: Secondary | ICD-10-CM | POA: Diagnosis not present

## 2020-05-16 DIAGNOSIS — Z923 Personal history of irradiation: Secondary | ICD-10-CM | POA: Diagnosis not present

## 2020-05-16 DIAGNOSIS — Z5111 Encounter for antineoplastic chemotherapy: Secondary | ICD-10-CM | POA: Diagnosis not present

## 2020-05-16 LAB — CBC WITH DIFFERENTIAL/PLATELET
Abs Immature Granulocytes: 0.02 10*3/uL (ref 0.00–0.07)
Basophils Absolute: 0 10*3/uL (ref 0.0–0.1)
Basophils Relative: 0 %
Eosinophils Absolute: 0.1 10*3/uL (ref 0.0–0.5)
Eosinophils Relative: 1 %
HCT: 26.6 % — ABNORMAL LOW (ref 39.0–52.0)
Hemoglobin: 8.7 g/dL — ABNORMAL LOW (ref 13.0–17.0)
Immature Granulocytes: 0 %
Lymphocytes Relative: 16 %
Lymphs Abs: 0.8 10*3/uL (ref 0.7–4.0)
MCH: 34 pg (ref 26.0–34.0)
MCHC: 32.7 g/dL (ref 30.0–36.0)
MCV: 103.9 fL — ABNORMAL HIGH (ref 80.0–100.0)
Monocytes Absolute: 0.5 10*3/uL (ref 0.1–1.0)
Monocytes Relative: 10 %
Neutro Abs: 3.4 10*3/uL (ref 1.7–7.7)
Neutrophils Relative %: 73 %
Platelets: 127 10*3/uL — ABNORMAL LOW (ref 150–400)
RBC: 2.56 MIL/uL — ABNORMAL LOW (ref 4.22–5.81)
RDW: 16.6 % — ABNORMAL HIGH (ref 11.5–15.5)
WBC: 4.7 10*3/uL (ref 4.0–10.5)
nRBC: 0 % (ref 0.0–0.2)

## 2020-05-16 LAB — COMPREHENSIVE METABOLIC PANEL
ALT: 25 U/L (ref 0–44)
AST: 13 U/L — ABNORMAL LOW (ref 15–41)
Albumin: 3.3 g/dL — ABNORMAL LOW (ref 3.5–5.0)
Alkaline Phosphatase: 104 U/L (ref 38–126)
Anion gap: 11 (ref 5–15)
BUN: 29 mg/dL — ABNORMAL HIGH (ref 8–23)
CO2: 27 mmol/L (ref 22–32)
Calcium: 8.5 mg/dL — ABNORMAL LOW (ref 8.9–10.3)
Chloride: 101 mmol/L (ref 98–111)
Creatinine, Ser: 2.17 mg/dL — ABNORMAL HIGH (ref 0.61–1.24)
GFR calc Af Amer: 34 mL/min — ABNORMAL LOW (ref >60)
GFR calc non Af Amer: 30 mL/min — ABNORMAL LOW (ref >60)
Glucose, Bld: 254 mg/dL — ABNORMAL HIGH (ref 70–99)
Potassium: 3.6 mmol/L (ref 3.5–5.1)
Sodium: 139 mmol/L (ref 135–145)
Total Bilirubin: 0.8 mg/dL (ref 0.3–1.2)
Total Protein: 5.6 g/dL — ABNORMAL LOW (ref 6.5–8.1)

## 2020-05-16 MED ORDER — SODIUM CHLORIDE 0.9% FLUSH
10.0000 mL | INTRAVENOUS | Status: DC | PRN
  Administered 2020-05-16: 10 mL via INTRAVENOUS

## 2020-05-16 MED ORDER — SODIUM CHLORIDE 0.9 % IV SOLN: Freq: Once | INTRAVENOUS | Status: AC

## 2020-05-16 MED ORDER — SODIUM CHLORIDE 0.9 % IV SOLN
16.0000 mg/kg | Freq: Once | INTRAVENOUS | Status: AC
  Administered 2020-05-16: 1600 mg via INTRAVENOUS
  Filled 2020-05-16: qty 80

## 2020-05-16 MED ORDER — ALTEPLASE 2 MG IJ SOLR: 2.0000 mg | Freq: Once | INTRAMUSCULAR | Status: DC | PRN

## 2020-05-16 MED ORDER — SODIUM CHLORIDE 0.9 % IV SOLN: 16.0000 mg/kg | Freq: Once | INTRAVENOUS | Status: DC

## 2020-05-16 MED ORDER — HEPARIN SOD (PORK) LOCK FLUSH 100 UNIT/ML IV SOLN: 250.0000 [IU] | Freq: Once | INTRAVENOUS | Status: DC | PRN

## 2020-05-16 MED ORDER — SODIUM CHLORIDE 0.9 % IV SOLN
20.0000 mg | Freq: Once | INTRAVENOUS | Status: AC
  Administered 2020-05-16: 20 mg via INTRAVENOUS
  Filled 2020-05-16: qty 20

## 2020-05-16 MED ORDER — ACETAMINOPHEN 325 MG PO TABS
650.0000 mg | ORAL_TABLET | Freq: Once | ORAL | Status: AC
  Administered 2020-05-16: 650 mg via ORAL
  Filled 2020-05-16: qty 2

## 2020-05-16 MED ORDER — DIPHENHYDRAMINE HCL 25 MG PO CAPS
50.0000 mg | ORAL_CAPSULE | Freq: Once | ORAL | Status: AC
  Administered 2020-05-16: 50 mg via ORAL
  Filled 2020-05-16: qty 2

## 2020-05-16 MED ORDER — SODIUM CHLORIDE 0.9% FLUSH: 3.0000 mL | INTRAVENOUS | Status: DC | PRN

## 2020-05-16 MED ORDER — HEPARIN SOD (PORK) LOCK FLUSH 100 UNIT/ML IV SOLN
500.0000 [IU] | Freq: Once | INTRAVENOUS | Status: AC | PRN
  Administered 2020-05-16: 500 [IU]

## 2020-05-16 MED ORDER — FAMOTIDINE IN NACL 20-0.9 MG/50ML-% IV SOLN
20.0000 mg | Freq: Once | INTRAVENOUS | Status: AC
  Administered 2020-05-16: 20 mg via INTRAVENOUS
  Filled 2020-05-16: qty 50

## 2020-05-16 MED ORDER — SODIUM CHLORIDE 0.9% FLUSH
10.0000 mL | INTRAVENOUS | Status: DC | PRN
  Administered 2020-05-16: 10 mL

## 2020-05-16 NOTE — Progress Notes (Signed)
05/16/20  OK to convert to rapid rate Darzalex for remainder of cycles.  T.O. Dr Rhys Martini, PharmD

## 2020-05-16 NOTE — Patient Instructions (Signed)
[place] at [facility] Discharge Instructions  You were seen today by Dr. Delton Coombes. He went over your recent results. You received your treatment today. Stop taking the prednisone tablets tomorrow. You will receive your next treatment in 1 week. Dr. Delton Coombes will see you back in 2 weeks for labs and follow up.   Thank you for choosing [place] at [facility] to provide your oncology and hematology care.  To afford each patient quality time with our provider, please arrive at least 15 minutes before your scheduled appointment time.   If you have a lab appointment with the [facility] please come in thru the Main Entrance and check in at the main information desk  You need to re-schedule your appointment should you arrive 10 or more minutes late.  We strive to give you quality time with our providers, and arriving late affects you and other patients whose appointments are after yours.  Also, if you no show three or more times for appointments you may be dismissed from the clinic at the providers discretion.     Again, thank you for choosing [facility].  Our hope is that these requests will decrease the amount of time that you wait before being seen by our physicians.       _____________________________________________________________  Should you have questions after your visit to [facility], please contact our office at [phone] between the hours of 8:00 a.m. and 4:30 p.m.  Voicemails left after 4:00 p.m. will not be returned until the following business day.  For prescription refill requests, have your pharmacy contact our office and allow 72 hours.    Cancer Center Support Programs:   > Cancer Support Group  2nd Tuesday of the month 1pm-2pm, Journey Room

## 2020-05-16 NOTE — Progress Notes (Addendum)
[facility], [place] [address]   CLINIC:  Medical Oncology/Hematology  PCP:  Tom Noble, MD [address] / [place] [address] [phone]   REASON FOR VISIT:  Follow-up for multiple myeloma  PRIOR THERAPY: Revlimid x 4 cycles from 08/30/2017 to 02/18/2018 with Revlimid maintenance  CURRENT THERAPY: Darzalex  BRIEF ONCOLOGIC HISTORY:  Oncology History  Multiple myeloma ([place])  08/29/2017 Initial Diagnosis   Multiple myeloma ([place])   12/10/2017 - 03/31/2018 Chemotherapy   The patient had bortezomib SQ (VELCADE) chemo injection 2.75 mg, 1.3 mg/m2 = 2.75 mg, Subcutaneous,  Once, 5 of 5 cycles Administration: 2.75 mg (12/10/2017), 2.75 mg (12/17/2017), 2.75 mg (12/31/2017), 2.75 mg (01/21/2018), 2.75 mg (02/18/2018), 2.75 mg (03/11/2018)  for chemotherapy treatment.    04/11/2020 -  Chemotherapy   The patient had daratumumab (DARZALEX) 800 mg in sodium chloride 0.9 % 960 mL (0.8 mg/mL) chemo infusion, 8 mg/kg = 800 mg (100 % of original dose 8 mg/kg), Intravenous, Once, 1 of 1 cycle Dose modification: 8 mg/kg (original dose 8 mg/kg, Cycle 1, Reason: Provider Judgment, Comment: giving total dose 45m/kg over 2 days) daratumumab (DARZALEX) 800 mg in sodium chloride 0.9 % 460 mL (1.6 mg/mL) chemo infusion, 8 mg/kg = 800 mg (50 % of original dose 16 mg/kg), Intravenous, Once, 1 of 7 cycles Dose modification: 8 mg/kg (original dose 16 mg/kg, Cycle 1, Reason: Provider Judgment, Comment: giving 16 mg/kg over 2 days. thus 8 mg/kg in 2 divided doses.) Administration: 1,600 mg (04/19/2020), 1,600 mg (05/03/2020), 1,600 mg (05/09/2020), 800 mg (04/11/2020), 800 mg (04/12/2020)  for chemotherapy treatment.      CANCER STAGING: Cancer Staging No matching staging information was found for the patient.  INTERVAL HISTORY:  [name] a 69y.o. male, returns for routine follow-up and consideration for next cycle of chemotherapy. [name] last seen  on 05/03/2020.  Due for cycle #2 of Darzalex today.   Today he reports that he is being careful to not fall; he denies having any dizziness or lightheaded. He takes Lasix 40 mg every day for his swollen legs. He is taking insulin and TMaldives He is not taking Pomalyst since his platelets fell down and he felt weak. He still has numbness in his fingers and feet. He denies having any pain at the moment. He continues taking Eliquis daily and denies having any bleeding.  Overall, he feels ready for next cycle of chemo today.    REVIEW OF SYSTEMS:  Review of Systems  Constitutional: Positive for appetite change (mildly decreased) and fatigue (severe).  Respiratory: Positive for cough.   Cardiovascular: Positive for leg swelling.  Gastrointestinal: Negative for blood in stool.  Genitourinary: Negative for hematuria.   Neurological: Positive for numbness (fingers & feet). Negative for dizziness and light-headedness.  All other systems reviewed and are negative.   PAST MEDICAL/SURGICAL HISTORY:  Past Medical History:  Diagnosis Date  . Cancer ([place]    multiple myeloma  . Cancer of right kidney ([place]   . Cervical dystonia   . Diabetes mellitus without complication ([place]   . Gout   . Hypercholesteremia    Past Surgical History:  Procedure Laterality Date  . BONE MARROW BIOPSY    . CHOLECYSTECTOMY  2007  . COLONOSCOPY WITH PROPOFOL N/A 01/16/2018   Procedure: COLONOSCOPY WITH PROPOFOL;  Surgeon: RDaneil Dolin MD;  Location: [facility];  Service: Endoscopy;  Laterality: N/A;  1:45pm  . ESOPHAGOGASTRODUODENOSCOPY (EGD) WITH  PROPOFOL N/A 01/16/2018   Procedure: ESOPHAGOGASTRODUODENOSCOPY (EGD) WITH PROPOFOL;  Surgeon: Daneil Dolin, MD;  Location: [facility];  Service: Endoscopy;  Laterality: N/A;  . GIVENS CAPSULE STUDY N/A 05/12/2018   Procedure: GIVENS CAPSULE STUDY;  Surgeon: Daneil Dolin, MD;  Location: [facility];  Service: Endoscopy;  Laterality: N/A;  7:30am  .  NEPHRECTOMY Right 1998   cancer  . PORTACATH PLACEMENT Right 04/07/2020   Procedure: INSERTION PORT-A-CATH (attached catherter in right internal jugular);  Surgeon: Virl Cagey, MD;  Location: [facility] ORS;  Service: General;  Laterality: Right;    SOCIAL HISTORY:  Social History   Socioeconomic History  . Marital status: Single    Spouse name: Not on file  . Number of children: Not on file  . Years of education: Not on file  . Highest education level: Not on file  Occupational History  . Occupation: Marine scientist, Insurance underwriter  Tobacco Use  . Smoking status: Never Smoker  . Smokeless tobacco: Former Systems developer    Types: Secondary school teacher  . Vaping Use: Never used  Substance and Sexual Activity  . Alcohol use: No  . Drug use: No  . Sexual activity: Not Currently  Other Topics Concern  . Not on file  Social History Narrative  . Not on file   Social Determinants of Health   Financial Resource Strain:   . Difficulty of Paying Living Expenses:   Food Insecurity:   . Worried About Charity fundraiser in the Last Year:   . Arboriculturist in the Last Year:   Transportation Needs:   . Film/video editor (Medical):   [name] Lack of Transportation (Non-Medical):   Physical Activity:   . Days of Exercise per Week:   . Minutes of Exercise per Session:   Stress:   . Feeling of Stress :   Social Connections:   . Frequency of Communication with Friends and Family:   . Frequency of Social Gatherings with Friends and Family:   . Attends Religious Services:   . Active Member of Clubs or Organizations:   . Attends Archivist Meetings:   [name] Marital Status:   Intimate Partner Violence:   . Fear of Current or Ex-Partner:   . Emotionally Abused:   [name] Physically Abused:   . Sexually Abused:     FAMILY HISTORY:  Family History  Problem Relation Age of Onset  . Heart failure Mother 34  . Dementia Father   . Colon cancer Neg Hx     CURRENT MEDICATIONS:  Current Outpatient  Medications  Medication Sig Dispense Refill  . acyclovir (ZOVIRAX) 400 MG tablet Take 0.5 tablets (200 mg total) by mouth 2 (two) times daily. 60 tablet 4  . allopurinol (ZYLOPRIM) 300 MG tablet Take 300 mg by mouth every morning.    . B-D UF III MINI PEN NEEDLES 31G X 5 MM MISC   4  . DARATUMUMAB IV Inject into the vein. Weeks 1-8 weekly x 8 doses; then every 2 weeks x 8 doses; then every 28 days    . dexamethasone (DECADRON) 4 MG tablet Take 2.5 tablets (10 mg total) by mouth once a week. 30 tablet 2  . ELIQUIS 2.5 MG TABS tablet TAKE 1 TABLET BY MOUTH TWICE DAILY AFTER COMPLETION OF THE STARTER PACK. (Patient taking differently: Take 2.5 mg by mouth daily. ) 60 tablet 11  . furosemide (LASIX) 40 MG tablet Take 40 mg by mouth every Monday,  Wednesday, and Friday.     . gabapentin (NEURONTIN) 300 MG capsule TAKE 1 CAPSULE BY MOUTH AT BEDTIME (Patient taking differently: Take 300 mg by mouth at bedtime. ) 30 capsule 1  . glipiZIDE (GLUCOTROL XL) 5 MG 24 hr tablet Take 5 mg by mouth daily with breakfast.     . Multiple Vitamins-Minerals (CENTRUM SILVER 50+MEN) TABS Take 1 tablet by mouth every morning.    . pomalidomide (POMALYST) 3 MG capsule Take 1 capsule (3 mg total) by mouth daily. Take with water on days 1-21. Repeat every 28 days. 21 capsule 0  . pravastatin (PRAVACHOL) 20 MG tablet Take 20 mg by mouth at bedtime.     . sodium bicarbonate 650 MG tablet Take 1 tablet (650 mg total) by mouth 2 (two) times daily. (Patient taking differently: Take 1,300 mg by mouth 2 (two) times daily. ) 60 tablet 1  . traZODone (DESYREL) 100 MG tablet Take 1 tablet (100 mg total) by mouth at bedtime. 30 tablet 3  . TRESIBA FLEXTOUCH 200 UNIT/ML SOPN Inject 44 Units into the skin daily.     . TRUE METRIX BLOOD GLUCOSE TEST test strip     . cyclobenzaprine (FLEXERIL) 10 MG tablet TAKE 1 TABLET THREE TIMES DAILY AS NEEDED FOR MUSCLE SPASM (Patient not taking: Reported on 05/16/2020) 30 tablet 0  . dicyclomine  (BENTYL) 10 MG capsule Take 10 mg by mouth 2 (two) times daily as needed.  (Patient not taking: Reported on 05/16/2020)    . HYDROcodone-acetaminophen (NORCO/VICODIN) 5-325 MG tablet Take 1 tablet by mouth every 4 (four) hours as needed for severe pain. (Patient not taking: Reported on 05/16/2020) 5 tablet 0  . lidocaine-prilocaine (EMLA) cream Apply small amount over port site 1 hour prior to appointment.  Cover with plastic wrap. (Patient not taking: Reported on 05/16/2020) 30 g 3  . prochlorperazine (COMPAZINE) 10 MG tablet Take 1 tablet (10 mg total) by mouth every 6 (six) hours as needed (Nausea or vomiting). (Patient not taking: Reported on 05/16/2020) 30 tablet 1   No current facility-administered medications for this visit.   Facility-Administered Medications Ordered in Other Visits  Medication Dose Route Frequency Provider Last Rate Last Admin  . 0.9 %  sodium chloride infusion  250 mL Intravenous Once Twana First, MD      . sodium chloride flush (NS) 0.9 % injection 10 mL  10 mL Intravenous PRN Derek Jack, MD   10 mL at 05/16/20 1019    ALLERGIES:  No Known Allergies  PHYSICAL EXAM:  Performance status (ECOG): 1 - Symptomatic but completely ambulatory  Vitals:   05/16/20 1015  BP: (!) 156/67  Pulse: (!) 109  Resp: 20  Temp: (!) 96.7 F (35.9 C)  SpO2: 98%   Wt Readings from Last 3 Encounters:  05/16/20 219 lb 14.4 oz (99.7 kg)  05/09/20 218 lb 12.8 oz (99.2 kg)  04/25/20 216 lb 6.4 oz (98.2 kg)   Physical Exam Vitals reviewed.  Constitutional:      Appearance: Normal appearance. He is obese.  Cardiovascular:     Rate and Rhythm: Regular rhythm. Tachycardia present.     Pulses: Normal pulses.     Heart sounds: Normal heart sounds.  Pulmonary:     Effort: Pulmonary effort is normal.     Breath sounds: Normal breath sounds.  Musculoskeletal:     Right lower leg: Edema (2+) present.     Left lower leg: Edema (2+) present.  Neurological:     General:  No  focal deficit present.     Mental Status: He is alert and oriented to person, place, and time.  Psychiatric:        Mood and Affect: Mood normal.        Behavior: Behavior normal.     LABORATORY DATA:  I have reviewed the labs as listed.  CBC Latest Ref Rng & Units 05/16/2020 05/09/2020 05/03/2020  WBC 4.0 - 10.5 K/uL 4.7 4.1 2.5(L)  Hemoglobin 13.0 - 17.0 g/dL 8.7(L) 8.6(L) 7.5(L)  Hematocrit 39 - 52 % 26.6(L) 26.6(L) 23.2(L)  Platelets 150 - 400 K/uL 127(L) 120(L) 97(L)   CMP Latest Ref Rng & Units 05/09/2020 05/03/2020 04/25/2020  Glucose 70 - 99 mg/dL 270(H) 243(H) 154(H)  BUN 8 - 23 mg/dL 29(H) 35(H) 45(H)  Creatinine 0.61 - 1.24 mg/dL 2.60(H) 2.79(H) 2.55(H)  Sodium 135 - 145 mmol/L 136 134(L) 134(L)  Potassium 3.5 - 5.1 mmol/L 3.6 4.1 3.8  Chloride 98 - 111 mmol/L 97(L) 96(L) 97(L)  CO2 22 - 32 mmol/L _0 Calcium 8.9 - 10.3 mg/dL 8.5(L) 8.4(L) 8.8(L)  Total Protein 6.5 - 8.1 g/dL 5.5(L) 5.8(L) 5.8(L)  Total Bilirubin 0.3 - 1.2 mg/dL 0.7 0.7 0.9  Alkaline Phos 38 - 126 U/L 126 137(H) 111  AST 15 - 41 U/L 17 12(L) 9(L)  ALT 0 - 44 U/L _1 Lab Results  Component Value Date   LDH 121 05/03/2020   LDH 152 04/11/2020   LDH 81 (L) 02/09/2020   Lab Results  Component Value Date   TIBC 244 (L) 05/03/2020   TIBC 313 03/24/2019   TIBC 287 12/31/2017   FERRITIN 732 (H) 05/03/2020   FERRITIN 580 (H) 03/24/2019   FERRITIN 666 (H) 01/13/2019   IRONPCTSAT 21 05/03/2020   IRONPCTSAT 23 03/24/2019   IRONPCTSAT 51 (H) 12/31/2017   Lab Results  Component Value Date   TOTALPROTELP 5.0 (L) 05/03/2020   ALBUMINELP 2.9 05/03/2020   A1GS 0.3 05/03/2020   A2GS 0.8 05/03/2020   BETS 0.7 05/03/2020   GAMS 0.2 (L) 05/03/2020   MSPIKE Not Observed 05/03/2020   SPEI Comment 05/03/2020    Lab Results  Component Value Date   KPAFRELGTCHN 935.5 (H) 05/03/2020   LAMBDASER 11.2 05/03/2020   KAPLAMBRATIO 83.53 (H) 05/03/2020       DIAGNOSTIC IMAGING:  I have  independently reviewed the scans and discussed with the patient. DG Chest 2 View  Result Date: 04/18/2020 CLINICAL DATA:  Shortness of breath EXAM: CHEST - 2 VIEW COMPARISON:  04/07/2020 FINDINGS: RIGHT jugular Port-A-Cath tip projecting over SVC. Normal heart size mediastinal contours. Atelectasis LEFT base. Minimal scarring RIGHT mid lung likely related to adjacent rib deformity, unchanged. No additional infiltrate, pleural effusion or pneumothorax. Osseous demineralization with old BILATERAL rib fractures and thoracolumbar scoliosis. IMPRESSION: LEFT basilar atelectasis and RIGHT mid lung scarring. Electronically Signed   By: Lavonia Dana M.D.   On: 04/18/2020 15:30   NM Pulmonary Perfusion  Result Date: 04/18/2020 CLINICAL DATA:  Shortness of breath, history of DVT, multiple myeloma, diabetes mellitus EXAM: NUCLEAR MEDICINE PERFUSION LUNG SCAN TECHNIQUE: Perfusion images were obtained in multiple projections after intravenous injection of radiopharmaceutical. Ventilation scans intentionally deferred if perfusion scan and chest x-ray adequate for interpretation during COVID 19 epidemic. RADIOPHARMACEUTICALS:  1.55 mCi Tc-33mMAA IV COMPARISON:  None Correlation: Chest radiograph 04/18/2020 FINDINGS: Focus of residual tracer within reservoir of RIGHT jugular Port-A-Cath related to injection. Perfusion defect LEFT lower lobe corresponding to atelectasis  at LEFT lung base on chest radiograph. Deformity of the lateral mid to lower RIGHT lung on anterior view corresponding to rib deformities on chest radiograph. No other perfusion defects identified. IMPRESSION: Perfusion defect LEFT lower lobe likely related to atelectasis seen on chest radiograph. No other perfusion defects identified. Findings are unlikely to represent pulmonary embolism. Electronically Signed   By: Lavonia Dana M.D.   On: 04/18/2020 14:36     ASSESSMENT:  1. IgG kappa multiple myeloma: -4 cycles of RVD from 08/30/2017 through  02/18/2018. -Declined bone marrow transplant. Received maintenance Revlimid 2.5 mg 3 weeks on 1 week off. -Bone marrow biopsy on 02/23/2020 shows 21% plasma cells. Occasional sections had up to 75% myeloma cells. -Chromosome analysis was normal. FISH panel was positive for gain of 1 q., monosomy 13, del 17 P, t(14;20) -PET scan showed new left frontal destructive lesion. Numerous areas of lytic changes in the spine. Lucent areas in T6 with loss of height at T5. Numerous lytic changes throughout the spine noted along with pedicle and lamina and transverse process of T8 vertebral body. Profound hypermetabolic activity within the ribs bilaterally.   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 -Completed XRT on 04/05/2020.  3. 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 He is on Eliquis.   PLAN:  1. IgG kappa multiple myeloma: -Myeloma labs from 05/03/2020 shows M spike of 0.2 g. -I reviewed his labs today which showed hemoglobin 8.7.  White count and platelets were normal.  He will proceed with his next cycle of Darzalex today.  He will also receive treatment next week.  I will reevaluate him in 2 weeks.  We will continue to hold his pomalidomide at this time. -We will hold dexamethasone 10 mg on Tuesdays at home.  We will repeat myeloma labs in 2 weeks.  2. CKD: -Creatinine improved to 2.17 today.  We will closely monitor.  3. Left leg DVT: -Continue Eliquis 2.5 mg daily.  No bleeding issues.  4.Diabetes: -Continue Tresiba and glipizide daily.  5. Insomnia: -Continue trazodone 100 mg at bedtime as needed.  6.  Lower extremity swelling: -Take Lasix 40 mg daily.   Orders placed this encounter:  No orders of the defined types were placed in  this encounter.    Derek Jack, MD [place] [phone]   I, Milinda Antis, am acting as a scribe for Dr. Sanda Linger.  I, Derek Jack MD, have reviewed the above documentation for accuracy and completeness, and I agree with the above.

## 2020-05-16 NOTE — Patient Instructions (Signed)
[facility] Discharge Instructions for Patients Receiving Chemotherapy   Beginning January 23rd 2017 lab work for the [facility] will be done in the  Main lab at [facility] on 1st floor. If you have a lab appointment with the [facility] please come in thru the  Main Entrance and check in at the main information desk   Today you received the following chemotherapy agents Darzalex. Follow-up as scheduled. Call clinic for any questions  To help prevent nausea and vomiting after your treatment, we encourage you to take your nausea medication   If you develop nausea and vomiting, or diarrhea that is not controlled by your medication, call the clinic.  The clinic phone number is [phone]. Office hours are Monday-Friday 8:30am-5:00pm.  BELOW ARE SYMPTOMS THAT SHOULD BE REPORTED IMMEDIATELY:  *FEVER GREATER THAN 101.0 F  *CHILLS WITH OR WITHOUT FEVER  NAUSEA AND VOMITING THAT IS NOT CONTROLLED WITH YOUR NAUSEA MEDICATION  *UNUSUAL SHORTNESS OF BREATH  *UNUSUAL BRUISING OR BLEEDING  TENDERNESS IN MOUTH AND THROAT WITH OR WITHOUT PRESENCE OF ULCERS  *URINARY PROBLEMS  *BOWEL PROBLEMS  UNUSUAL RASH Items with * indicate a potential emergency and should be followed up as soon as possible. If you have an emergency after office hours please contact your primary care physician or go to the nearest emergency department.  Please call the clinic during office hours if you have any questions or concerns.   You may also contact the Patient Navigator at [phone] should you have any questions or need assistance in obtaining follow up care.      Resources For Cancer Patients and their Caregivers ? American Cancer Society: Can assist with transportation, wigs, general needs, runs Look Good Feel Better.        307-218-5923 ? Cancer Care: Provides financial assistance, online support groups, medication/co-pay assistance.  1-800-813-HOPE 623-795-9415) ? [facility] Assists [place] cancer patients and their families through emotional , educational and financial support.  907-381-9847 ? [place] DSS Where to apply for food stamps, Medicaid and utility assistance. 7797565950 ? [facility]: Transportation to medical appointments. (818)473-8045 ? Social Security Administration: May apply for disability if have a Stage IV cancer. 519-456-6043 707-778-4481 ? [employer], Disability and Transit Services: Assists with nutrition, care and transit needs. 412-613-3856

## 2020-05-16 NOTE — Progress Notes (Signed)
1200 Labs reviewed with and pt seen by Dr. Delton Coombes and pt approved for Darzalex infusion today per MD                                 Tom Marshall tolerated Darzalex infusion well without complaints or incident. VSS upon discharge. Pt discharged via wheelchair in satisfactory condition

## 2020-05-23 ENCOUNTER — Inpatient Hospital Stay (HOSPITAL_COMMUNITY): Payer: Medicare Other

## 2020-05-23 ENCOUNTER — Other Ambulatory Visit: Payer: Self-pay

## 2020-05-23 ENCOUNTER — Encounter (HOSPITAL_COMMUNITY): Payer: Self-pay

## 2020-05-23 VITALS — BP 128/61 | HR 84 | Temp 97.1°F | Resp 18

## 2020-05-23 DIAGNOSIS — C9000 Multiple myeloma not having achieved remission: Secondary | ICD-10-CM | POA: Diagnosis not present

## 2020-05-23 DIAGNOSIS — Z7901 Long term (current) use of anticoagulants: Secondary | ICD-10-CM | POA: Diagnosis not present

## 2020-05-23 DIAGNOSIS — Z923 Personal history of irradiation: Secondary | ICD-10-CM | POA: Diagnosis not present

## 2020-05-23 DIAGNOSIS — Z5111 Encounter for antineoplastic chemotherapy: Secondary | ICD-10-CM | POA: Diagnosis not present

## 2020-05-23 DIAGNOSIS — Z86718 Personal history of other venous thrombosis and embolism: Secondary | ICD-10-CM | POA: Diagnosis not present

## 2020-05-23 DIAGNOSIS — E1122 Type 2 diabetes mellitus with diabetic chronic kidney disease: Secondary | ICD-10-CM | POA: Diagnosis not present

## 2020-05-23 LAB — CBC WITH DIFFERENTIAL/PLATELET
Abs Immature Granulocytes: 0.05 10*3/uL (ref 0.00–0.07)
Basophils Absolute: 0 10*3/uL (ref 0.0–0.1)
Basophils Relative: 0 %
Eosinophils Absolute: 0.1 10*3/uL (ref 0.0–0.5)
Eosinophils Relative: 1 %
HCT: 29 % — ABNORMAL LOW (ref 39.0–52.0)
Hemoglobin: 9.5 g/dL — ABNORMAL LOW (ref 13.0–17.0)
Immature Granulocytes: 1 %
Lymphocytes Relative: 16 %
Lymphs Abs: 1 10*3/uL (ref 0.7–4.0)
MCH: 33.9 pg (ref 26.0–34.0)
MCHC: 32.8 g/dL (ref 30.0–36.0)
MCV: 103.6 fL — ABNORMAL HIGH (ref 80.0–100.0)
Monocytes Absolute: 0.6 10*3/uL (ref 0.1–1.0)
Monocytes Relative: 9 %
Neutro Abs: 4.6 10*3/uL (ref 1.7–7.7)
Neutrophils Relative %: 73 %
Platelets: 121 10*3/uL — ABNORMAL LOW (ref 150–400)
RBC: 2.8 MIL/uL — ABNORMAL LOW (ref 4.22–5.81)
RDW: 16.9 % — ABNORMAL HIGH (ref 11.5–15.5)
WBC: 6.4 10*3/uL (ref 4.0–10.5)
nRBC: 0.6 % — ABNORMAL HIGH (ref 0.0–0.2)

## 2020-05-23 LAB — COMPREHENSIVE METABOLIC PANEL
ALT: 27 U/L (ref 0–44)
AST: 14 U/L — ABNORMAL LOW (ref 15–41)
Albumin: 3.6 g/dL (ref 3.5–5.0)
Alkaline Phosphatase: 104 U/L (ref 38–126)
Anion gap: 11 (ref 5–15)
BUN: 23 mg/dL (ref 8–23)
CO2: 26 mmol/L (ref 22–32)
Calcium: 8.8 mg/dL — ABNORMAL LOW (ref 8.9–10.3)
Chloride: 100 mmol/L (ref 98–111)
Creatinine, Ser: 2.32 mg/dL — ABNORMAL HIGH (ref 0.61–1.24)
GFR calc Af Amer: 32 mL/min — ABNORMAL LOW (ref >60)
GFR calc non Af Amer: 27 mL/min — ABNORMAL LOW (ref >60)
Glucose, Bld: 291 mg/dL — ABNORMAL HIGH (ref 70–99)
Potassium: 3.5 mmol/L (ref 3.5–5.1)
Sodium: 137 mmol/L (ref 135–145)
Total Bilirubin: 0.8 mg/dL (ref 0.3–1.2)
Total Protein: 5.9 g/dL — ABNORMAL LOW (ref 6.5–8.1)

## 2020-05-23 MED ORDER — ACETAMINOPHEN 325 MG PO TABS
650.0000 mg | ORAL_TABLET | Freq: Once | ORAL | Status: AC
  Administered 2020-05-23: 650 mg via ORAL
  Filled 2020-05-23: qty 2

## 2020-05-23 MED ORDER — SODIUM CHLORIDE 0.9 % IV SOLN: Freq: Once | INTRAVENOUS | Status: AC

## 2020-05-23 MED ORDER — SODIUM CHLORIDE 0.9% FLUSH
10.0000 mL | INTRAVENOUS | Status: DC | PRN
  Administered 2020-05-23 (×2): 10 mL

## 2020-05-23 MED ORDER — FAMOTIDINE IN NACL 20-0.9 MG/50ML-% IV SOLN
20.0000 mg | Freq: Once | INTRAVENOUS | Status: AC
  Administered 2020-05-23: 20 mg via INTRAVENOUS
  Filled 2020-05-23: qty 50

## 2020-05-23 MED ORDER — DIPHENHYDRAMINE HCL 25 MG PO CAPS
50.0000 mg | ORAL_CAPSULE | Freq: Once | ORAL | Status: AC
  Administered 2020-05-23: 50 mg via ORAL
  Filled 2020-05-23: qty 2

## 2020-05-23 MED ORDER — SODIUM CHLORIDE 0.9 % IV SOLN
20.0000 mg | Freq: Once | INTRAVENOUS | Status: AC
  Administered 2020-05-23: 20 mg via INTRAVENOUS
  Filled 2020-05-23: qty 20

## 2020-05-23 MED ORDER — HEPARIN SOD (PORK) LOCK FLUSH 100 UNIT/ML IV SOLN
500.0000 [IU] | Freq: Once | INTRAVENOUS | Status: AC | PRN
  Administered 2020-05-23: 500 [IU]

## 2020-05-23 MED ORDER — CYANOCOBALAMIN 1000 MCG/ML IJ SOLN
INTRAMUSCULAR | Status: AC
  Filled 2020-05-23: qty 1

## 2020-05-23 MED ORDER — SODIUM CHLORIDE 0.9 % IV SOLN
16.0000 mg/kg | Freq: Once | INTRAVENOUS | Status: AC
  Administered 2020-05-23: 1600 mg via INTRAVENOUS
  Filled 2020-05-23: qty 80

## 2020-05-23 NOTE — Progress Notes (Signed)
Patient is not taking the pomalyst as it is still on hold per provider.   Labs reviewed with Dr Raliegh Ip, okay for treatment today.    Tom Marshall tolerated daratumumab well today.  VS WNL.  Discharged via wheelchair.

## 2020-05-23 NOTE — Patient Instructions (Signed)
[facility] at [facility] Discharge Instructions  Labs drawn from portacath today   Thank you for choosing [facility] at [facility] to provide your oncology and hematology care.  To afford each patient quality time with our provider, please arrive at least 15 minutes before your scheduled appointment time.   If you have a lab appointment with the Cancer Center please come in thru the Main Entrance and check in at the main information desk.  You need to re-schedule your appointment should you arrive 10 or more minutes late.  We strive to give you quality time with our providers, and arriving late affects you and other patients whose appointments are after yours.  Also, if you no show three or more times for appointments you may be dismissed from the clinic at the providers discretion.     Again, thank you for choosing [facility].  Our hope is that these requests will decrease the amount of time that you wait before being seen by our physicians.       _____________________________________________________________  Should you have questions after your visit to [facility], please contact our office at [phone] and follow the prompts.  Our office hours are 8:00 a.m. and 4:30 p.m. Monday - Friday.  Please note that voicemails left after 4:00 p.m. may not be returned until the following business day.  We are closed weekends and major holidays.  You do have access to a nurse 24-7, just call the main number to the clinic [phone] and do not press any options, hold on the line and a nurse will answer the phone.    For prescription refill requests, have your pharmacy contact our office and allow 72 hours.    Due to Covid, you will need to wear a mask upon entering the hospital. If you do not have a mask, a mask will be given to you at the Main Entrance upon arrival. For doctor visits, patients may have 1 support person age 18  or older with them. For treatment visits, patients can not have anyone with them due to social distancing guidelines and our immunocompromised population.     

## 2020-05-23 NOTE — Patient Instructions (Signed)
[facility] Discharge Instructions for Patients Receiving Chemotherapy  Today you received the following chemotherapy agents   To help prevent nausea and vomiting after your treatment, we encourage you to take your nausea medication   If you develop nausea and vomiting that is not controlled by your nausea medication, call the clinic.   BELOW ARE SYMPTOMS THAT SHOULD BE REPORTED IMMEDIATELY:  *FEVER GREATER THAN 100.5 F  *CHILLS WITH OR WITHOUT FEVER  NAUSEA AND VOMITING THAT IS NOT CONTROLLED WITH YOUR NAUSEA MEDICATION  *UNUSUAL SHORTNESS OF BREATH  *UNUSUAL BRUISING OR BLEEDING  TENDERNESS IN MOUTH AND THROAT WITH OR WITHOUT PRESENCE OF ULCERS  *URINARY PROBLEMS  *BOWEL PROBLEMS  UNUSUAL RASH Items with * indicate a potential emergency and should be followed up as soon as possible.  Feel free to call the clinic should you have any questions or concerns. The clinic phone number is [phone].  Please show the CHEMO ALERT CARD at check-in to the Emergency Department and triage nurse.   

## 2020-05-30 ENCOUNTER — Other Ambulatory Visit: Payer: Self-pay

## 2020-05-30 ENCOUNTER — Inpatient Hospital Stay (HOSPITAL_COMMUNITY): Payer: Medicare Other

## 2020-05-30 ENCOUNTER — Inpatient Hospital Stay (HOSPITAL_COMMUNITY): Payer: Medicare Other | Attending: Hematology | Admitting: Hematology

## 2020-05-30 VITALS — BP 130/68 | HR 103 | Temp 97.1°F | Resp 18 | Wt 221.1 lb

## 2020-05-30 VITALS — BP 137/81 | HR 80 | Temp 97.0°F | Resp 18

## 2020-05-30 DIAGNOSIS — Z79899 Other long term (current) drug therapy: Secondary | ICD-10-CM | POA: Diagnosis not present

## 2020-05-30 DIAGNOSIS — C9000 Multiple myeloma not having achieved remission: Secondary | ICD-10-CM | POA: Insufficient documentation

## 2020-05-30 DIAGNOSIS — Z9221 Personal history of antineoplastic chemotherapy: Secondary | ICD-10-CM | POA: Insufficient documentation

## 2020-05-30 DIAGNOSIS — Z794 Long term (current) use of insulin: Secondary | ICD-10-CM | POA: Insufficient documentation

## 2020-05-30 DIAGNOSIS — N189 Chronic kidney disease, unspecified: Secondary | ICD-10-CM | POA: Diagnosis not present

## 2020-05-30 DIAGNOSIS — R739 Hyperglycemia, unspecified: Secondary | ICD-10-CM

## 2020-05-30 DIAGNOSIS — Z7901 Long term (current) use of anticoagulants: Secondary | ICD-10-CM | POA: Insufficient documentation

## 2020-05-30 DIAGNOSIS — Z85528 Personal history of other malignant neoplasm of kidney: Secondary | ICD-10-CM | POA: Diagnosis not present

## 2020-05-30 DIAGNOSIS — Z5112 Encounter for antineoplastic immunotherapy: Secondary | ICD-10-CM | POA: Insufficient documentation

## 2020-05-30 DIAGNOSIS — Z923 Personal history of irradiation: Secondary | ICD-10-CM | POA: Diagnosis not present

## 2020-05-30 DIAGNOSIS — R3911 Hesitancy of micturition: Secondary | ICD-10-CM | POA: Diagnosis not present

## 2020-05-30 DIAGNOSIS — G4700 Insomnia, unspecified: Secondary | ICD-10-CM | POA: Insufficient documentation

## 2020-05-30 DIAGNOSIS — Z86718 Personal history of other venous thrombosis and embolism: Secondary | ICD-10-CM | POA: Diagnosis not present

## 2020-05-30 DIAGNOSIS — Z905 Acquired absence of kidney: Secondary | ICD-10-CM | POA: Insufficient documentation

## 2020-05-30 DIAGNOSIS — E1122 Type 2 diabetes mellitus with diabetic chronic kidney disease: Secondary | ICD-10-CM | POA: Diagnosis not present

## 2020-05-30 LAB — CBC WITH DIFFERENTIAL/PLATELET
Abs Immature Granulocytes: 0.04 10*3/uL (ref 0.00–0.07)
Basophils Absolute: 0 10*3/uL (ref 0.0–0.1)
Basophils Relative: 0 %
Eosinophils Absolute: 0 10*3/uL (ref 0.0–0.5)
Eosinophils Relative: 1 %
HCT: 28.3 % — ABNORMAL LOW (ref 39.0–52.0)
Hemoglobin: 9.3 g/dL — ABNORMAL LOW (ref 13.0–17.0)
Immature Granulocytes: 1 %
Lymphocytes Relative: 14 %
Lymphs Abs: 1.1 10*3/uL (ref 0.7–4.0)
MCH: 33.7 pg (ref 26.0–34.0)
MCHC: 32.9 g/dL (ref 30.0–36.0)
MCV: 102.5 fL — ABNORMAL HIGH (ref 80.0–100.0)
Monocytes Absolute: 0.6 10*3/uL (ref 0.1–1.0)
Monocytes Relative: 8 %
Neutro Abs: 5.9 10*3/uL (ref 1.7–7.7)
Neutrophils Relative %: 76 %
Platelets: 100 10*3/uL — ABNORMAL LOW (ref 150–400)
RBC: 2.76 MIL/uL — ABNORMAL LOW (ref 4.22–5.81)
RDW: 17.2 % — ABNORMAL HIGH (ref 11.5–15.5)
WBC: 7.7 10*3/uL (ref 4.0–10.5)
nRBC: 0.3 % — ABNORMAL HIGH (ref 0.0–0.2)

## 2020-05-30 LAB — MAGNESIUM: Magnesium: 1.9 mg/dL (ref 1.7–2.4)

## 2020-05-30 LAB — COMPREHENSIVE METABOLIC PANEL
ALT: 25 U/L (ref 0–44)
AST: 14 U/L — ABNORMAL LOW (ref 15–41)
Albumin: 3.6 g/dL (ref 3.5–5.0)
Alkaline Phosphatase: 106 U/L (ref 38–126)
Anion gap: 11 (ref 5–15)
BUN: 34 mg/dL — ABNORMAL HIGH (ref 8–23)
CO2: 26 mmol/L (ref 22–32)
Calcium: 8.8 mg/dL — ABNORMAL LOW (ref 8.9–10.3)
Chloride: 99 mmol/L (ref 98–111)
Creatinine, Ser: 2.35 mg/dL — ABNORMAL HIGH (ref 0.61–1.24)
GFR calc Af Amer: 31 mL/min — ABNORMAL LOW (ref >60)
GFR calc non Af Amer: 27 mL/min — ABNORMAL LOW (ref >60)
Glucose, Bld: 326 mg/dL — ABNORMAL HIGH (ref 70–99)
Potassium: 3.6 mmol/L (ref 3.5–5.1)
Sodium: 136 mmol/L (ref 135–145)
Total Bilirubin: 1.1 mg/dL (ref 0.3–1.2)
Total Protein: 5.8 g/dL — ABNORMAL LOW (ref 6.5–8.1)

## 2020-05-30 LAB — LACTATE DEHYDROGENASE: LDH: 118 U/L (ref 98–192)

## 2020-05-30 MED ORDER — FAMOTIDINE IN NACL 20-0.9 MG/50ML-% IV SOLN
20.0000 mg | Freq: Once | INTRAVENOUS | Status: AC
  Administered 2020-05-30: 20 mg via INTRAVENOUS
  Filled 2020-05-30: qty 50

## 2020-05-30 MED ORDER — ACETAMINOPHEN 325 MG PO TABS
650.0000 mg | ORAL_TABLET | Freq: Once | ORAL | Status: AC
  Administered 2020-05-30: 650 mg via ORAL
  Filled 2020-05-30: qty 2

## 2020-05-30 MED ORDER — SODIUM CHLORIDE 0.9% FLUSH
10.0000 mL | INTRAVENOUS | Status: DC | PRN
  Administered 2020-05-30: 10 mL

## 2020-05-30 MED ORDER — DIPHENHYDRAMINE HCL 25 MG PO CAPS
50.0000 mg | ORAL_CAPSULE | Freq: Once | ORAL | Status: AC
  Administered 2020-05-30: 50 mg via ORAL
  Filled 2020-05-30: qty 2

## 2020-05-30 MED ORDER — TAMSULOSIN HCL 0.4 MG PO CAPS: 0.4000 mg | ORAL_CAPSULE | Freq: Every day | ORAL | 3 refills | Status: DC

## 2020-05-30 MED ORDER — SODIUM CHLORIDE 0.9 % IV SOLN
20.0000 mg | Freq: Once | INTRAVENOUS | Status: AC
  Administered 2020-05-30: 20 mg via INTRAVENOUS
  Filled 2020-05-30: qty 20

## 2020-05-30 MED ORDER — INSULIN REGULAR HUMAN 100 UNIT/ML IJ SOLN
10.0000 [IU] | Freq: Once | INTRAMUSCULAR | Status: AC
  Administered 2020-05-30: 10 [IU] via INTRAVENOUS
  Filled 2020-05-30: qty 0.1

## 2020-05-30 MED ORDER — TRAZODONE HCL 100 MG PO TABS: 100.0000 mg | ORAL_TABLET | Freq: Every day | ORAL | 3 refills | Status: DC

## 2020-05-30 MED ORDER — INFLUENZA VAC A&B SA ADJ QUAD 0.5 ML IM PRSY: 0.5000 mL | PREFILLED_SYRINGE | Freq: Once | INTRAMUSCULAR | Status: DC

## 2020-05-30 MED ORDER — SODIUM CHLORIDE 0.9 % IV SOLN
16.0000 mg/kg | Freq: Once | INTRAVENOUS | Status: AC
  Administered 2020-05-30: 1600 mg via INTRAVENOUS
  Filled 2020-05-30: qty 80

## 2020-05-30 MED ORDER — HEPARIN SOD (PORK) LOCK FLUSH 100 UNIT/ML IV SOLN
500.0000 [IU] | Freq: Once | INTRAVENOUS | Status: AC | PRN
  Administered 2020-05-30: 500 [IU]

## 2020-05-30 MED ORDER — GLIPIZIDE ER 5 MG PO TB24
5.0000 mg | ORAL_TABLET | Freq: Once | ORAL | Status: AC
  Administered 2020-05-30: 5 mg via ORAL
  Filled 2020-05-30: qty 1

## 2020-05-30 MED ORDER — SODIUM CHLORIDE 0.9 % IV SOLN: Freq: Once | INTRAVENOUS | Status: AC

## 2020-05-30 NOTE — Progress Notes (Signed)
Patient has been assessed, vital signs and labs have been reviewed by Dr. Delton Coombes. ANC, Creatinine, LFTs, and Platelets are within treatment parameters, Sugar is elevated today due to missed medication, please give Glipizide 5mg  PO and 10 units of regular insulin IV per Dr. Delton Coombes. The patient is good to proceed with treatment at this time.

## 2020-05-30 NOTE — Patient Instructions (Signed)
[facility] Discharge Instructions for Patients Receiving Chemotherapy  Today you received the following chemotherapy agents   To help prevent nausea and vomiting after your treatment, we encourage you to take your nausea medication   If you develop nausea and vomiting that is not controlled by your nausea medication, call the clinic.   BELOW ARE SYMPTOMS THAT SHOULD BE REPORTED IMMEDIATELY:  *FEVER GREATER THAN 100.5 F  *CHILLS WITH OR WITHOUT FEVER  NAUSEA AND VOMITING THAT IS NOT CONTROLLED WITH YOUR NAUSEA MEDICATION  *UNUSUAL SHORTNESS OF BREATH  *UNUSUAL BRUISING OR BLEEDING  TENDERNESS IN MOUTH AND THROAT WITH OR WITHOUT PRESENCE OF ULCERS  *URINARY PROBLEMS  *BOWEL PROBLEMS  UNUSUAL RASH Items with * indicate a potential emergency and should be followed up as soon as possible.  Feel free to call the clinic should you have any questions or concerns. The clinic phone number is [phone].  Please show the CHEMO ALERT CARD at check-in to the Emergency Department and triage nurse.   

## 2020-05-30 NOTE — Patient Instructions (Signed)
[place] [facility] Discharge Instructions  You were seen today by Dr. Delton Coombes. He went over your recent results. You received your treatment today; continue your weekly treatments. You will be prescribed tamsulosin to take daily for your weak urinary stream. Dr. Delton Coombes will see you back in 2 weeks for labs and follow up.   Thank you for choosing [place] at [facility] to provide your oncology and hematology care.  To afford each patient quality time with our provider, please arrive at least 15 minutes before your scheduled appointment time.   If you have a lab appointment with the [facility] please come in thru the Main Entrance and check in at the main information desk  You need to re-schedule your appointment should you arrive 10 or more minutes late.  We strive to give you quality time with our providers, and arriving late affects you and other patients whose appointments are after yours.  Also, if you no show three or more times for appointments you may be dismissed from the clinic at the providers discretion.     Again, thank you for choosing [facility].  Our hope is that these requests will decrease the amount of time that you wait before being seen by our physicians.       _____________________________________________________________  Should you have questions after your visit to [facility], please contact our office at [phone] between the hours of 8:00 a.m. and 4:30 p.m.  Voicemails left after 4:00 p.m. will not be returned until the following business day.  For prescription refill requests, have your pharmacy contact our office and allow 72 hours.    Cancer Center Support Programs:   > Cancer Support Group  2nd Tuesday of the month 1pm-2pm, Journey Room

## 2020-05-30 NOTE — Progress Notes (Signed)
05/31/20  Clarified orders:  Regular insulin 10 units IV x 1 Glipizide XL 5 mg orally x 1   T.O. Dr Rush Farmer, PharmD

## 2020-05-30 NOTE — Progress Notes (Signed)
[place] [address] [place], [place] [address]   CLINIC:  Medical Oncology/Hematology  PCP:  Asencion Noble, MD [address] / [place] [address] [phone]   REASON FOR VISIT:  Follow-up for multiple myeloma  PRIOR THERAPY: Revlimid x 4 cycles from 08/30/2017 to 02/18/2018 with Revlimid maintenance  CURRENT THERAPY: Darzalex  BRIEF ONCOLOGIC HISTORY:  Oncology History  Multiple myeloma ([place])  08/29/2017 Initial Diagnosis   Multiple myeloma ([place])   12/10/2017 - 03/31/2018 Chemotherapy   The patient had bortezomib SQ (VELCADE) chemo injection 2.75 mg, 1.3 mg/m2 = 2.75 mg, Subcutaneous,  Once, 5 of 5 cycles Administration: 2.75 mg (12/10/2017), 2.75 mg (12/17/2017), 2.75 mg (12/31/2017), 2.75 mg (01/21/2018), 2.75 mg (02/18/2018), 2.75 mg (03/11/2018)  for chemotherapy treatment.    04/11/2020 -  Chemotherapy   The patient had daratumumab (DARZALEX) 800 mg in sodium chloride 0.9 % 960 mL (0.8 mg/mL) chemo infusion, 8 mg/kg = 800 mg (100 % of original dose 8 mg/kg), Intravenous, Once, 1 of 1 cycle Dose modification: 8 mg/kg (original dose 8 mg/kg, Cycle 1, Reason: Provider Judgment, Comment: giving total dose 52m/kg over 2 days) daratumumab (DARZALEX) 800 mg in sodium chloride 0.9 % 460 mL (1.6 mg/mL) chemo infusion, 8 mg/kg = 800 mg (50 % of original dose 16 mg/kg), Intravenous, Once, 2 of 2 cycles Dose modification: 8 mg/kg (original dose 16 mg/kg, Cycle 1, Reason: Provider Judgment, Comment: giving 16 mg/kg over 2 days. thus 8 mg/kg in 2 divided doses.) Administration: 1,600 mg (04/19/2020), 1,600 mg (05/03/2020), 1,600 mg (05/09/2020), 800 mg (04/11/2020), 800 mg (04/12/2020) daratumumab (DARZALEX) 1,600 mg in sodium chloride 0.9 % 420 mL chemo infusion, 16 mg/kg = 1,600 mg, Intravenous, Once, 1 of 6 cycles Administration: 1,600 mg (05/16/2020), 1,600 mg (05/23/2020)  for chemotherapy treatment.      CANCER STAGING: Cancer Staging No matching staging  information was found for the patient.  INTERVAL HISTORY:  Tom Marshall a 69y.o. male, returns for routine follow-up and consideration for next cycle of chemotherapy. Tom Marshall last seen on 05/16/2020.  Due for day #15 of cycle #2 of Darzalex today.   Overall, today he tells me he has been feeling pretty better and that his energy levels have improved. He reports that he has a weak urinary stream even when he takes Lasix. He also has trouble falling asleep some nights. His appetite is good.  Overall, he feels ready for next cycle of chemo today.    REVIEW OF SYSTEMS:  Review of Systems  Constitutional: Positive for fatigue (moderate). Negative for appetite change.  Respiratory: Positive for cough.   Genitourinary: Positive for difficulty urinating (weak stream).   Neurological: Positive for numbness (hands & feet).  Psychiatric/Behavioral: Positive for sleep disturbance (trouble falling asleep).  All other systems reviewed and are negative.   PAST MEDICAL/SURGICAL HISTORY:  Past Medical History:  Diagnosis Date   Cancer ([facility]    multiple myeloma   Cancer of right kidney ([place]    Cervical dystonia    Diabetes mellitus without complication ([place]    Gout    Hypercholesteremia    Past Surgical History:  Procedure Laterality Date   BONE MARROW BIOPSY     CHOLECYSTECTOMY  2007   COLONOSCOPY WITH PROPOFOL N/A 01/16/2018   Procedure: COLONOSCOPY WITH PROPOFOL;  Surgeon: RDaneil Dolin MD;  [facility];  Service: Endoscopy;  Laterality: N/A;  1:45pm   ESOPHAGOGASTRODUODENOSCOPY (EGD) WITH PROPOFOL N/A 01/16/2018  Procedure: ESOPHAGOGASTRODUODENOSCOPY (EGD) WITH PROPOFOL;  Surgeon: Daneil Dolin, MD;  Location: [facility];  Service: Endoscopy;  Laterality: N/A;   GIVENS CAPSULE STUDY N/A 05/12/2018   Procedure: GIVENS CAPSULE STUDY;  Surgeon: Daneil Dolin, MD;  Location: [facility];  Service: Endoscopy;  Laterality: N/A;  7:30am    NEPHRECTOMY Right 1998   cancer   PORTACATH PLACEMENT Right 04/07/2020   Procedure: INSERTION PORT-A-CATH (attached catherter in right internal jugular);  Surgeon: Virl Cagey, MD;  Location: [facility] ORS;  Service: General;  Laterality: Right;    SOCIAL HISTORY:  Social History   Socioeconomic History   Marital status: Single    Spouse name: Not on file   Number of children: Not on file   Years of education: Not on file   Highest education level: Not on file  Occupational History   Occupation: Part-time Clinical cytogeneticist, Insurance underwriter  Tobacco Use   Smoking status: Never Smoker   Smokeless tobacco: Former Systems developer    Types: Nurse, children's Use: Never used  Substance and Sexual Activity   Alcohol use: No   Drug use: No   Sexual activity: Not Currently  Other Topics Concern   Not on file  Social History Narrative   Not on file   Social Determinants of Health   Financial Resource Strain:    Difficulty of Paying Living Expenses:   Food Insecurity:    Worried About Charity fundraiser in the Last Year:    Arboriculturist in the Last Year:   Transportation Needs:    Film/video editor (Medical):    Lack of Transportation (Non-Medical):   Physical Activity:    Days of Exercise per Week:    Minutes of Exercise per Session:   Stress:    Feeling of Stress :   Social Connections:    Frequency of Communication with Friends and Family:    Frequency of Social Gatherings with Friends and Family:    Attends Religious Services:    Active Member of Clubs or Organizations:    Attends Music therapist:    Marital Status:   Intimate Partner Violence:    Fear of Current or Ex-Partner:    Emotionally Abused:    Physically Abused:    Sexually Abused:     FAMILY HISTORY:  Family History  Problem Relation Age of Onset   Heart failure Mother 38   Dementia Father    Colon cancer Neg Hx     CURRENT MEDICATIONS:  Current Outpatient  Medications  Medication Sig Dispense Refill   acyclovir (ZOVIRAX) 400 MG tablet Take 0.5 tablets (200 mg total) by mouth 2 (two) times daily. 60 tablet 4   allopurinol (ZYLOPRIM) 300 MG tablet Take 300 mg by mouth every morning.     B-D UF III MINI PEN NEEDLES 31G X 5 MM MISC   4   cyclobenzaprine (FLEXERIL) 10 MG tablet TAKE 1 TABLET THREE TIMES DAILY AS NEEDED FOR MUSCLE SPASM 30 tablet 0   DARATUMUMAB IV Inject into the vein. Weeks 1-8 weekly x 8 doses; then every 2 weeks x 8 doses; then every 28 days     dicyclomine (BENTYL) 10 MG capsule Take 10 mg by mouth 2 (two) times daily as needed.      ELIQUIS 2.5 MG TABS tablet TAKE 1 TABLET BY MOUTH TWICE DAILY AFTER COMPLETION OF THE STARTER PACK. (Patient taking differently: Take 2.5 mg by mouth daily. ) 60  tablet 11   furosemide (LASIX) 40 MG tablet Take 40 mg by mouth every Monday, Wednesday, and Friday.      gabapentin (NEURONTIN) 300 MG capsule TAKE 1 CAPSULE BY MOUTH AT BEDTIME (Patient taking differently: Take 300 mg by mouth at bedtime. ) 30 capsule 1   glipiZIDE (GLUCOTROL XL) 5 MG 24 hr tablet Take 5 mg by mouth daily with breakfast.      lidocaine-prilocaine (EMLA) cream Apply small amount over port site 1 hour prior to appointment.  Cover with plastic wrap. 30 g 3   Multiple Vitamins-Minerals (CENTRUM SILVER 50+MEN) TABS Take 1 tablet by mouth every morning.     pomalidomide (POMALYST) 3 MG capsule Take 1 capsule (3 mg total) by mouth daily. Take with water on days 1-21. Repeat every 28 days. 21 capsule 0   pravastatin (PRAVACHOL) 20 MG tablet Take 20 mg by mouth at bedtime.      prochlorperazine (COMPAZINE) 10 MG tablet Take 1 tablet (10 mg total) by mouth every 6 (six) hours as needed (Nausea or vomiting). 30 tablet 1   sodium bicarbonate 650 MG tablet Take 1 tablet (650 mg total) by mouth 2 (two) times daily. (Patient taking differently: Take 1,300 mg by mouth 2 (two) times daily. ) 60 tablet 1   traZODone (DESYREL)  100 MG tablet Take 1 tablet (100 mg total) by mouth at bedtime. 30 tablet 3   TRESIBA FLEXTOUCH 200 UNIT/ML SOPN Inject 44 Units into the skin daily.      TRUE METRIX BLOOD GLUCOSE TEST test strip      HYDROcodone-acetaminophen (NORCO/VICODIN) 5-325 MG tablet Take 1 tablet by mouth every 4 (four) hours as needed for severe pain. (Patient not taking: Reported on 05/30/2020) 5 tablet 0   No current facility-administered medications for this visit.   Facility-Administered Medications Ordered in Other Visits  Medication Dose Route Frequency Provider Last Rate Last Admin   0.9 %  sodium chloride infusion  250 mL Intravenous Once Twana First, MD        ALLERGIES:  No Known Allergies  PHYSICAL EXAM:  Performance status (ECOG): 1 - Symptomatic but completely ambulatory  Vitals:   05/30/20 0809  BP: (!) 130/68  Pulse: 103  Resp: 18  Temp: (!) 97.1 F (36.2 C)  SpO2: 98%   Wt Readings from Last 3 Encounters:  05/30/20 (!) 221 lb 1.6 oz (100.3 kg)  05/23/20 (!) 222 lb 4.8 oz (100.8 kg)  05/16/20 219 lb 14.4 oz (99.7 kg)   Physical Exam Vitals reviewed.  Constitutional:      Appearance: Normal appearance. He is obese.  Cardiovascular:     Rate and Rhythm: Normal rate and regular rhythm.     Pulses: Normal pulses.     Heart sounds: Normal heart sounds.  Pulmonary:     Effort: Pulmonary effort is normal.     Breath sounds: Normal breath sounds.  Chest:     Comments: Port-a-Cath on R chest Musculoskeletal:     Right lower leg: Edema (1+) present.     Left lower leg: Edema (1+) present.  Neurological:     General: No focal deficit present.     Mental Status: He is alert and oriented to person, place, and time.  Psychiatric:        Mood and Affect: Mood normal.        Behavior: Behavior normal.     LABORATORY DATA:  I have reviewed the labs as listed.  CBC Latest Ref Rng &  Units 05/30/2020 05/23/2020 05/16/2020  WBC 4.0 - 10.5 K/uL 7.7 6.4 4.7  Hemoglobin 13.0 - 17.0 g/dL  9.3(L) 9.5(L) 8.7(L)  Hematocrit 39 - 52 % 28.3(L) 29.0(L) 26.6(L)  Platelets 150 - 400 K/uL 100(L) 121(L) 127(L)   CMP Latest Ref Rng & Units 05/30/2020 05/23/2020 05/16/2020  Glucose 70 - 99 mg/dL 326(H) 291(H) 254(H)  BUN 8 - 23 mg/dL 34(H) 23 29(H)  Creatinine 0.61 - 1.24 mg/dL 2.35(H) 2.32(H) 2.17(H)  Sodium 135 - 145 mmol/L 136 137 139  Potassium 3.5 - 5.1 mmol/L 3.6 3.5 3.6  Chloride 98 - 111 mmol/L 99 100 101  CO2 22 - 32 mmol/L _0 Calcium 8.9 - 10.3 mg/dL 8.8(L) 8.8(L) 8.5(L)  Total Protein 6.5 - 8.1 g/dL 5.8(L) 5.9(L) 5.6(L)  Total Bilirubin 0.3 - 1.2 mg/dL 1.1 0.8 0.8  Alkaline Phos 38 - 126 U/L 106 104 104  AST 15 - 41 U/L 14(L) 14(L) 13(L)  ALT 0 - 44 U/L _1 Lab Results  Component Value Date   LDH 118 05/30/2020   LDH 121 05/03/2020   LDH 152 04/11/2020   Lab Results  Component Value Date   TOTALPROTELP 5.0 (L) 05/03/2020   ALBUMINELP 2.9 05/03/2020   A1GS 0.3 05/03/2020   A2GS 0.8 05/03/2020   BETS 0.7 05/03/2020   GAMS 0.2 (L) 05/03/2020   MSPIKE Not Observed 05/03/2020   SPEI Comment 05/03/2020    Lab Results  Component Value Date   KPAFRELGTCHN 935.5 (H) 05/03/2020   LAMBDASER 11.2 05/03/2020   KAPLAMBRATIO 83.53 (H) 05/03/2020    DIAGNOSTIC IMAGING:  I have independently reviewed the scans and discussed with the patient. No results found.   ASSESSMENT:  1. IgG kappa multiple myeloma: -4 cycles of RVD from 08/30/2017 through 02/18/2018. -Declined bone marrow transplant. Received maintenance Revlimid 2.5 mg 3 weeks on 1 week off. -Bone marrow biopsy on 02/23/2020 shows 21% plasma cells. Occasional sections had up to 75% myeloma cells. -Chromosome analysis was normal. FISH panel was positive for gain of 1 q., monosomy 13, del 17 P, t(14;20) -PET scan showed new left frontal destructive lesion. Numerous areas of lytic changes in the spine. Lucent areas in T6 with loss of height at T5. Numerous lytic changes throughout the spine noted  along with pedicle and lamina and transverse process of T8 vertebral body. Profound hypermetabolic activity within the ribs bilaterally.  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 -Completed XRT on 04/05/2020.  3. 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 He is on Eliquis.   PLAN:  1. IgG kappa multiple myeloma: -He is tolerating Darzalex very well.  He does not follow any in the last 2 weeks.  We are continuing to hold Pomalyst. -He will proceed with day 15 of cycle 2 today. -I plan to see him back in 2 weeks.  I will plan to repeat myeloma panel in 1 week.  Based on the myeloma panel, will plan to start back on Pomalyst at 2 mg dose.  2. CKD: -Creatinine is 2.32 today.  3. Left leg DVT: -Continue Eliquis 2.5 mg daily.  No bleeding issues.  4.Diabetes: -Continue Tresiba and glipizide daily.  He ran out of glipizide and did not take it today.  Blood sugar  is high.  He will receive 10 units of insulin and glipizide 5 mg.  We have also sent refill to the pharmacy.  5. Insomnia: -Continue trazodone 100 mg at bedtime as needed.  6.  Lower extremity swelling: -Continue Lasix 40 mg daily.  7.  Urinary hesitancy: -Start him on Flomax 0.4 mg daily at bedtime.   Orders placed this encounter:  No orders of the defined types were placed in this encounter.    Derek Jack, MD [facility] [phone]   I, Milinda Antis, am acting as a scribe for Dr. Sanda Linger.  I, Derek Jack MD, have reviewed the above documentation for accuracy and completeness, and I agree with the above.

## 2020-05-30 NOTE — Progress Notes (Signed)
Patient presents today for treatment and follow up visit with Dr. Delton Coombes. Patient has no complaints of pain. Labs reviewed.  Glucose 326. Vital signs are stable. Creatinine 2.35.  Message received by AT[facility] LPN/ Dr. Delton Coombes to proceed with treatment. Give 10 Units of Novolin R IV x 1 dose now. Give Glipizide 5mg  PO x 1 dose now.   Treatment given today per MD orders. Tolerated infusion without adverse affects. Vital signs stable. No complaints at this time. Discharged from clinic via wheel chair. F/U with [facility] as scheduled.

## 2020-05-31 LAB — PROTEIN ELECTROPHORESIS, SERUM
A/G Ratio: 1.5 (ref 0.7–1.7)
Albumin ELP: 3.2 g/dL (ref 2.9–4.4)
Alpha-1-Globulin: 0.2 g/dL (ref 0.0–0.4)
Alpha-2-Globulin: 0.8 g/dL (ref 0.4–1.0)
Beta Globulin: 0.9 g/dL (ref 0.7–1.3)
Gamma Globulin: 0.2 g/dL — ABNORMAL LOW (ref 0.4–1.8)
Globulin, Total: 2.2 g/dL (ref 2.2–3.9)
M-Spike, %: 0.1 g/dL — ABNORMAL HIGH
Total Protein ELP: 5.4 g/dL — ABNORMAL LOW (ref 6.0–8.5)

## 2020-05-31 LAB — KAPPA/LAMBDA LIGHT CHAINS
Kappa free light chain: 770.3 mg/L — ABNORMAL HIGH (ref 3.3–19.4)
Kappa, lambda light chain ratio: 183.4 — ABNORMAL HIGH (ref 0.26–1.65)
Lambda free light chains: 4.2 mg/L — ABNORMAL LOW (ref 5.7–26.3)

## 2020-06-06 ENCOUNTER — Inpatient Hospital Stay (HOSPITAL_COMMUNITY): Payer: Medicare Other

## 2020-06-06 ENCOUNTER — Other Ambulatory Visit: Payer: Self-pay

## 2020-06-06 ENCOUNTER — Encounter (HOSPITAL_COMMUNITY): Payer: Self-pay

## 2020-06-06 VITALS — BP 127/63 | HR 73 | Temp 97.5°F | Resp 18

## 2020-06-06 DIAGNOSIS — C9000 Multiple myeloma not having achieved remission: Secondary | ICD-10-CM | POA: Diagnosis not present

## 2020-06-06 DIAGNOSIS — Z9221 Personal history of antineoplastic chemotherapy: Secondary | ICD-10-CM | POA: Diagnosis not present

## 2020-06-06 DIAGNOSIS — Z923 Personal history of irradiation: Secondary | ICD-10-CM | POA: Diagnosis not present

## 2020-06-06 DIAGNOSIS — N189 Chronic kidney disease, unspecified: Secondary | ICD-10-CM | POA: Diagnosis not present

## 2020-06-06 DIAGNOSIS — E1122 Type 2 diabetes mellitus with diabetic chronic kidney disease: Secondary | ICD-10-CM | POA: Diagnosis not present

## 2020-06-06 DIAGNOSIS — Z5112 Encounter for antineoplastic immunotherapy: Secondary | ICD-10-CM | POA: Diagnosis not present

## 2020-06-06 LAB — COMPREHENSIVE METABOLIC PANEL
ALT: 23 U/L (ref 0–44)
AST: 12 U/L — ABNORMAL LOW (ref 15–41)
Albumin: 3.6 g/dL (ref 3.5–5.0)
Alkaline Phosphatase: 101 U/L (ref 38–126)
Anion gap: 12 (ref 5–15)
BUN: 28 mg/dL — ABNORMAL HIGH (ref 8–23)
CO2: 25 mmol/L (ref 22–32)
Calcium: 8.8 mg/dL — ABNORMAL LOW (ref 8.9–10.3)
Chloride: 98 mmol/L (ref 98–111)
Creatinine, Ser: 2.29 mg/dL — ABNORMAL HIGH (ref 0.61–1.24)
GFR calc Af Amer: 32 mL/min — ABNORMAL LOW (ref >60)
GFR calc non Af Amer: 28 mL/min — ABNORMAL LOW (ref >60)
Glucose, Bld: 239 mg/dL — ABNORMAL HIGH (ref 70–99)
Potassium: 3.7 mmol/L (ref 3.5–5.1)
Sodium: 135 mmol/L (ref 135–145)
Total Bilirubin: 0.6 mg/dL (ref 0.3–1.2)
Total Protein: 6 g/dL — ABNORMAL LOW (ref 6.5–8.1)

## 2020-06-06 LAB — CBC WITH DIFFERENTIAL/PLATELET
Abs Immature Granulocytes: 0.07 10*3/uL (ref 0.00–0.07)
Basophils Absolute: 0 10*3/uL (ref 0.0–0.1)
Basophils Relative: 0 %
Eosinophils Absolute: 0 10*3/uL (ref 0.0–0.5)
Eosinophils Relative: 1 %
HCT: 27.9 % — ABNORMAL LOW (ref 39.0–52.0)
Hemoglobin: 9.4 g/dL — ABNORMAL LOW (ref 13.0–17.0)
Immature Granulocytes: 1 %
Lymphocytes Relative: 15 %
Lymphs Abs: 1.3 10*3/uL (ref 0.7–4.0)
MCH: 34.3 pg — ABNORMAL HIGH (ref 26.0–34.0)
MCHC: 33.7 g/dL (ref 30.0–36.0)
MCV: 101.8 fL — ABNORMAL HIGH (ref 80.0–100.0)
Monocytes Absolute: 0.6 10*3/uL (ref 0.1–1.0)
Monocytes Relative: 7 %
Neutro Abs: 6.5 10*3/uL (ref 1.7–7.7)
Neutrophils Relative %: 76 %
Platelets: 99 10*3/uL — ABNORMAL LOW (ref 150–400)
RBC: 2.74 MIL/uL — ABNORMAL LOW (ref 4.22–5.81)
RDW: 17.2 % — ABNORMAL HIGH (ref 11.5–15.5)
WBC: 8.4 10*3/uL (ref 4.0–10.5)
nRBC: 0.4 % — ABNORMAL HIGH (ref 0.0–0.2)

## 2020-06-06 LAB — LACTATE DEHYDROGENASE: LDH: 120 U/L (ref 98–192)

## 2020-06-06 LAB — MAGNESIUM: Magnesium: 1.9 mg/dL (ref 1.7–2.4)

## 2020-06-06 MED ORDER — ACETAMINOPHEN 325 MG PO TABS
650.0000 mg | ORAL_TABLET | Freq: Once | ORAL | Status: AC
  Administered 2020-06-06: 650 mg via ORAL
  Filled 2020-06-06: qty 2

## 2020-06-06 MED ORDER — DIPHENHYDRAMINE HCL 25 MG PO CAPS
50.0000 mg | ORAL_CAPSULE | Freq: Once | ORAL | Status: AC
  Administered 2020-06-06: 50 mg via ORAL
  Filled 2020-06-06: qty 2

## 2020-06-06 MED ORDER — SODIUM CHLORIDE 0.9% FLUSH
10.0000 mL | INTRAVENOUS | Status: DC | PRN
  Administered 2020-06-06: 10 mL

## 2020-06-06 MED ORDER — SODIUM CHLORIDE 0.9 % IV SOLN
16.0000 mg/kg | Freq: Once | INTRAVENOUS | Status: AC
  Administered 2020-06-06: 1600 mg via INTRAVENOUS
  Filled 2020-06-06: qty 80

## 2020-06-06 MED ORDER — FAMOTIDINE IN NACL 20-0.9 MG/50ML-% IV SOLN
20.0000 mg | Freq: Once | INTRAVENOUS | Status: AC
  Administered 2020-06-06: 20 mg via INTRAVENOUS
  Filled 2020-06-06: qty 50

## 2020-06-06 MED ORDER — SODIUM CHLORIDE 0.9 % IV SOLN
20.0000 mg | Freq: Once | INTRAVENOUS | Status: AC
  Administered 2020-06-06: 20 mg via INTRAVENOUS
  Filled 2020-06-06: qty 20

## 2020-06-06 MED ORDER — SODIUM CHLORIDE 0.9 % IV SOLN: Freq: Once | INTRAVENOUS | Status: AC

## 2020-06-06 MED ORDER — HEPARIN SOD (PORK) LOCK FLUSH 100 UNIT/ML IV SOLN
500.0000 [IU] | Freq: Once | INTRAVENOUS | Status: AC | PRN
  Administered 2020-06-06: 500 [IU]

## 2020-06-06 NOTE — Patient Instructions (Signed)
[facility] at [facility] Discharge Instructions  Labs drawn from portacath today   Thank you for choosing [facility] at [facility] to provide your oncology and hematology care.  To afford each patient quality time with our provider, please arrive at least 15 minutes before your scheduled appointment time.   If you have a lab appointment with the Cancer Center please come in thru the Main Entrance and check in at the main information desk.  You need to re-schedule your appointment should you arrive 10 or more minutes late.  We strive to give you quality time with our providers, and arriving late affects you and other patients whose appointments are after yours.  Also, if you no show three or more times for appointments you may be dismissed from the clinic at the providers discretion.     Again, thank you for choosing [facility].  Our hope is that these requests will decrease the amount of time that you wait before being seen by our physicians.       _____________________________________________________________  Should you have questions after your visit to [facility], please contact our office at [phone] and follow the prompts.  Our office hours are 8:00 a.m. and 4:30 p.m. Monday - Friday.  Please note that voicemails left after 4:00 p.m. may not be returned until the following business day.  We are closed weekends and major holidays.  You do have access to a nurse 24-7, just call the main number to the clinic [phone] and do not press any options, hold on the line and a nurse will answer the phone.    For prescription refill requests, have your pharmacy contact our office and allow 72 hours.    Due to Covid, you will need to wear a mask upon entering the hospital. If you do not have a mask, a mask will be given to you at the Main Entrance upon arrival. For doctor visits, patients may have 1 support person age 18  or older with them. For treatment visits, patients can not have anyone with them due to social distancing guidelines and our immunocompromised population.     

## 2020-06-06 NOTE — Patient Instructions (Signed)
[facility] Discharge Instructions for Patients Receiving Chemotherapy   Beginning January 23rd 2017 lab work for the [facility] will be done in the  Main lab at [facility] on 1st floor. If you have a lab appointment with the [facility] please come in thru the  Main Entrance and check in at the main information desk   Today you received the following chemotherapy agents Darzalex. Follow-up as scheduled  To help prevent nausea and vomiting after your treatment, we encourage you to take your nausea medication   If you develop nausea and vomiting, or diarrhea that is not controlled by your medication, call the clinic.  The clinic phone number is [phone]. Office hours are Monday-Friday 8:30am-5:00pm.  BELOW ARE SYMPTOMS THAT SHOULD BE REPORTED IMMEDIATELY:  *FEVER GREATER THAN 101.0 F  *CHILLS WITH OR WITHOUT FEVER  NAUSEA AND VOMITING THAT IS NOT CONTROLLED WITH YOUR NAUSEA MEDICATION  *UNUSUAL SHORTNESS OF BREATH  *UNUSUAL BRUISING OR BLEEDING  TENDERNESS IN MOUTH AND THROAT WITH OR WITHOUT PRESENCE OF ULCERS  *URINARY PROBLEMS  *BOWEL PROBLEMS  UNUSUAL RASH Items with * indicate a potential emergency and should be followed up as soon as possible. If you have an emergency after office hours please contact your primary care physician or go to the nearest emergency department.  Please call the clinic during office hours if you have any questions or concerns.   You may also contact the Patient Navigator at [phone] should you have any questions or need assistance in obtaining follow up care.      Resources For Cancer Patients and their Caregivers ? American Cancer Society: Can assist with transportation, wigs, general needs, runs Look Good Feel Better.        ([phone] ? Cancer Care: Provides financial assistance, online support groups, medication/co-pay assistance.  1-800-813-HOPE 678-259-8497) ? [place] Assists [place] cancer patients and their families through emotional , educational and financial support.  539 311 7553 ? [place] DSS Where to apply for food stamps, Medicaid and utility assistance. 415-766-3427 ? [facility]: Transportation to medical appointments. 405-378-7375 ? Social Security Administration: May apply for disability if have a Stage IV cancer. 6406332200 413-684-5363 ? [employer], Disability and Transit Services: Assists with nutrition, care and transit needs. 534-546-8071

## 2020-06-06 NOTE — Progress Notes (Signed)
0908 Labs,including platelets of 99, reviewed with Dr. Delton Coombes and pt approved for Darzalex infusion today per MD   Tom Marshall tolerated Darzalex infusion well without complaints or incident. VSS upon discharge. Pt discharged via wheelchair in satisfactory condition

## 2020-06-07 LAB — PROTEIN ELECTROPHORESIS, SERUM
A/G Ratio: 1.5 (ref 0.7–1.7)
Albumin ELP: 3.3 g/dL (ref 2.9–4.4)
Alpha-1-Globulin: 0.2 g/dL (ref 0.0–0.4)
Alpha-2-Globulin: 0.9 g/dL (ref 0.4–1.0)
Beta Globulin: 0.9 g/dL (ref 0.7–1.3)
Gamma Globulin: 0.2 g/dL — ABNORMAL LOW (ref 0.4–1.8)
Globulin, Total: 2.2 g/dL (ref 2.2–3.9)
M-Spike, %: 0.1 g/dL — ABNORMAL HIGH
Total Protein ELP: 5.5 g/dL — ABNORMAL LOW (ref 6.0–8.5)

## 2020-06-07 LAB — KAPPA/LAMBDA LIGHT CHAINS
Kappa free light chain: 922.9 mg/L — ABNORMAL HIGH (ref 3.3–19.4)
Kappa, lambda light chain ratio: 214.63 — ABNORMAL HIGH (ref 0.26–1.65)
Lambda free light chains: 4.3 mg/L — ABNORMAL LOW (ref 5.7–26.3)

## 2020-06-13 ENCOUNTER — Other Ambulatory Visit (HOSPITAL_COMMUNITY): Payer: Self-pay | Admitting: *Deleted

## 2020-06-13 ENCOUNTER — Inpatient Hospital Stay (HOSPITAL_BASED_OUTPATIENT_CLINIC_OR_DEPARTMENT_OTHER): Payer: Medicare Other | Admitting: Hematology

## 2020-06-13 ENCOUNTER — Inpatient Hospital Stay (HOSPITAL_COMMUNITY): Payer: Medicare Other

## 2020-06-13 ENCOUNTER — Encounter (HOSPITAL_COMMUNITY): Payer: Self-pay | Admitting: Hematology

## 2020-06-13 ENCOUNTER — Other Ambulatory Visit: Payer: Self-pay

## 2020-06-13 VITALS — BP 134/64 | HR 81 | Temp 96.9°F | Resp 18

## 2020-06-13 DIAGNOSIS — C9000 Multiple myeloma not having achieved remission: Secondary | ICD-10-CM | POA: Diagnosis not present

## 2020-06-13 DIAGNOSIS — R739 Hyperglycemia, unspecified: Secondary | ICD-10-CM

## 2020-06-13 DIAGNOSIS — Z5112 Encounter for antineoplastic immunotherapy: Secondary | ICD-10-CM | POA: Diagnosis not present

## 2020-06-13 DIAGNOSIS — Z923 Personal history of irradiation: Secondary | ICD-10-CM | POA: Diagnosis not present

## 2020-06-13 DIAGNOSIS — N189 Chronic kidney disease, unspecified: Secondary | ICD-10-CM | POA: Diagnosis not present

## 2020-06-13 DIAGNOSIS — E1122 Type 2 diabetes mellitus with diabetic chronic kidney disease: Secondary | ICD-10-CM | POA: Diagnosis not present

## 2020-06-13 DIAGNOSIS — Z9221 Personal history of antineoplastic chemotherapy: Secondary | ICD-10-CM | POA: Diagnosis not present

## 2020-06-13 LAB — CBC WITH DIFFERENTIAL/PLATELET
Abs Immature Granulocytes: 0.04 10*3/uL (ref 0.00–0.07)
Basophils Absolute: 0 10*3/uL (ref 0.0–0.1)
Basophils Relative: 0 %
Eosinophils Absolute: 0 10*3/uL (ref 0.0–0.5)
Eosinophils Relative: 0 %
HCT: 29.4 % — ABNORMAL LOW (ref 39.0–52.0)
Hemoglobin: 9.8 g/dL — ABNORMAL LOW (ref 13.0–17.0)
Immature Granulocytes: 1 %
Lymphocytes Relative: 11 %
Lymphs Abs: 0.9 10*3/uL (ref 0.7–4.0)
MCH: 33.9 pg (ref 26.0–34.0)
MCHC: 33.3 g/dL (ref 30.0–36.0)
MCV: 101.7 fL — ABNORMAL HIGH (ref 80.0–100.0)
Monocytes Absolute: 0.5 10*3/uL (ref 0.1–1.0)
Monocytes Relative: 7 %
Neutro Abs: 6.2 10*3/uL (ref 1.7–7.7)
Neutrophils Relative %: 81 %
Platelets: 94 10*3/uL — ABNORMAL LOW (ref 150–400)
RBC: 2.89 MIL/uL — ABNORMAL LOW (ref 4.22–5.81)
RDW: 16.9 % — ABNORMAL HIGH (ref 11.5–15.5)
WBC: 7.7 10*3/uL (ref 4.0–10.5)
nRBC: 0 % (ref 0.0–0.2)

## 2020-06-13 LAB — COMPREHENSIVE METABOLIC PANEL
ALT: 24 U/L (ref 0–44)
AST: 17 U/L (ref 15–41)
Albumin: 3.6 g/dL (ref 3.5–5.0)
Alkaline Phosphatase: 93 U/L (ref 38–126)
Anion gap: 12 (ref 5–15)
BUN: 24 mg/dL — ABNORMAL HIGH (ref 8–23)
CO2: 26 mmol/L (ref 22–32)
Calcium: 8.9 mg/dL (ref 8.9–10.3)
Chloride: 97 mmol/L — ABNORMAL LOW (ref 98–111)
Creatinine, Ser: 2.46 mg/dL — ABNORMAL HIGH (ref 0.61–1.24)
GFR calc Af Amer: 30 mL/min — ABNORMAL LOW (ref >60)
GFR calc non Af Amer: 26 mL/min — ABNORMAL LOW (ref >60)
Glucose, Bld: 391 mg/dL — ABNORMAL HIGH (ref 70–99)
Potassium: 4 mmol/L (ref 3.5–5.1)
Sodium: 135 mmol/L (ref 135–145)
Total Bilirubin: 0.7 mg/dL (ref 0.3–1.2)
Total Protein: 6.1 g/dL — ABNORMAL LOW (ref 6.5–8.1)

## 2020-06-13 MED ORDER — HEPARIN SOD (PORK) LOCK FLUSH 100 UNIT/ML IV SOLN
500.0000 [IU] | Freq: Once | INTRAVENOUS | Status: AC | PRN
  Administered 2020-06-13: 500 [IU]

## 2020-06-13 MED ORDER — SODIUM CHLORIDE 0.9 % IV SOLN
16.0000 mg/kg | Freq: Once | INTRAVENOUS | Status: AC
  Administered 2020-06-13: 1600 mg via INTRAVENOUS
  Filled 2020-06-13: qty 80

## 2020-06-13 MED ORDER — INSULIN REGULAR HUMAN 100 UNIT/ML IJ SOLN
10.0000 [IU] | Freq: Once | INTRAMUSCULAR | Status: AC
  Administered 2020-06-13: 10 [IU] via INTRAVENOUS
  Filled 2020-06-13: qty 10

## 2020-06-13 MED ORDER — ACETAMINOPHEN 325 MG PO TABS
650.0000 mg | ORAL_TABLET | Freq: Once | ORAL | Status: AC
  Administered 2020-06-13: 650 mg via ORAL
  Filled 2020-06-13: qty 2

## 2020-06-13 MED ORDER — FAMOTIDINE IN NACL 20-0.9 MG/50ML-% IV SOLN
20.0000 mg | Freq: Once | INTRAVENOUS | Status: AC
  Administered 2020-06-13: 20 mg via INTRAVENOUS
  Filled 2020-06-13: qty 50

## 2020-06-13 MED ORDER — SODIUM CHLORIDE 0.9% FLUSH
10.0000 mL | INTRAVENOUS | Status: DC | PRN
  Administered 2020-06-13: 10 mL

## 2020-06-13 MED ORDER — SODIUM CHLORIDE 0.9 % IV SOLN: Freq: Once | INTRAVENOUS | Status: AC

## 2020-06-13 MED ORDER — SODIUM CHLORIDE 0.9 % IV SOLN
20.0000 mg | Freq: Once | INTRAVENOUS | Status: AC
  Administered 2020-06-13: 20 mg via INTRAVENOUS
  Filled 2020-06-13: qty 20

## 2020-06-13 MED ORDER — DIPHENHYDRAMINE HCL 25 MG PO CAPS
50.0000 mg | ORAL_CAPSULE | Freq: Once | ORAL | Status: AC
  Administered 2020-06-13: 50 mg via ORAL
  Filled 2020-06-13: qty 2

## 2020-06-13 MED ORDER — INSULIN ASPART 100 UNIT/ML ~~LOC~~ SOLN: 10.0000 [IU] | Freq: Once | SUBCUTANEOUS | Status: DC

## 2020-06-13 MED ORDER — POMALIDOMIDE 2 MG PO CAPS: 2.0000 mg | ORAL_CAPSULE | Freq: Every day | ORAL | 0 refills | Status: DC

## 2020-06-13 MED ORDER — GLIPIZIDE ER 10 MG PO TB24: 10.0000 mg | ORAL_TABLET | Freq: Every day | ORAL | 2 refills | Status: DC

## 2020-06-13 NOTE — Progress Notes (Signed)
Tom Marshall presents today for D1C3 Daratumumab. Pt denies any new changes or symptoms since last treatment. Lab results and vitals have been reviewed, including Cr 2.46 and platelets 94, and are stable and within parameters for treatment today per Dr. Delton Coombes. Blood glucose is elevated at 391 today, insulin 10 units IV once to be given per MD. Patient has been assessed by Dr. Delton Coombes who has approved proceeding with treatment today as planned.  Infusions tolerated without incident or complaint. VSS upon completion of treatment. Port flushed and deaccessed per protocol, see MAR and IV flowsheet for details. Discharged in satisfactory condition with follow up instructions.

## 2020-06-13 NOTE — Telephone Encounter (Signed)
Per Dr. Delton Coombes during today's visit, decreasing dose of pomalyst to 2 mg.  Patient is going to return the unused portion of the 3 mg pomalyst that he has at home.  He is aware that he is to discontinue to 3 mg and start taking the 2 mg once it arrives.  New prescription sent to pharmacy.

## 2020-06-13 NOTE — Patient Instructions (Signed)
[facility] Discharge Instructions for Patients Receiving Chemotherapy   Beginning January 23rd 2017 lab work for the Cancer Center will be done in the  Main lab at [facility] on 1st floor. If you have a lab appointment with the Cancer Center please come in thru the  Main Entrance and check in at the main information desk   Today you received the following chemotherapy agents Daratumumab  To help prevent nausea and vomiting after your treatment, we encourage you to take your nausea medication   If you develop nausea and vomiting, or diarrhea that is not controlled by your medication, call the clinic.  The clinic phone number is [phone]. Office hours are Monday-Friday 8:30am-5:00pm.  BELOW ARE SYMPTOMS THAT SHOULD BE REPORTED IMMEDIATELY:  *FEVER GREATER THAN 101.0 F  *CHILLS WITH OR WITHOUT FEVER  NAUSEA AND VOMITING THAT IS NOT CONTROLLED WITH YOUR NAUSEA MEDICATION  *UNUSUAL SHORTNESS OF BREATH  *UNUSUAL BRUISING OR BLEEDING  TENDERNESS IN MOUTH AND THROAT WITH OR WITHOUT PRESENCE OF ULCERS  *URINARY PROBLEMS  *BOWEL PROBLEMS  UNUSUAL RASH Items with * indicate a potential emergency and should be followed up as soon as possible. If you have an emergency after office hours please contact your primary care physician or go to the nearest emergency department.  Please call the clinic during office hours if you have any questions or concerns.   You may also contact the Patient Navigator at [phone] should you have any questions or need assistance in obtaining follow up care.      Resources For Cancer Patients and their Caregivers ? American Cancer Society: Can assist with transportation, wigs, general needs, runs Look Good Feel Better.        1-888-227-6333 ? Cancer Care: Provides financial assistance, online support groups, medication/co-pay assistance.  1-800-813-HOPE (4673) ? [facility] Assists [place]  cancer patients and their families through emotional , educational and financial support.  336-427-4357 ? [place] DSS Where to apply for food stamps, Medicaid and utility assistance. 336-342-1394 ? [facility]: Transportation to medical appointments. 336-347-2287 ? Social Security Administration: May apply for disability if have a Stage IV cancer. 336-342-7796 1-800-772-1213 ? [place] Aging, Disability and Transit Services: Assists with nutrition, care and transit needs. 336-349-2343          

## 2020-06-13 NOTE — Progress Notes (Signed)
[place] [place], [place] [id number]   CLINIC:  Medical Oncology/Hematology  PCP:  Asencion Noble, MD [address] / [place] [address] [phone]   REASON FOR VISIT:  Follow-up for multiple myeloma  PRIOR THERAPY: Revlimid x 4 cycles from 08/30/2017 to 02/18/2018 with Revlimid maintenance  NGS Results: Not done  CURRENT THERAPY: Darzalex weekly  BRIEF ONCOLOGIC HISTORY:  Oncology History  Multiple myeloma ([place])  08/29/2017 Initial Diagnosis   Multiple myeloma ([place])   12/10/2017 - 03/31/2018 Chemotherapy   The patient had bortezomib SQ (VELCADE) chemo injection 2.75 mg, 1.3 mg/m2 = 2.75 mg, Subcutaneous,  Once, 5 of 5 cycles Administration: 2.75 mg (12/10/2017), 2.75 mg (12/17/2017), 2.75 mg (12/31/2017), 2.75 mg (01/21/2018), 2.75 mg (02/18/2018), 2.75 mg (03/11/2018)  for chemotherapy treatment.    04/11/2020 -  Chemotherapy   The patient had daratumumab (DARZALEX) 800 mg in sodium chloride 0.9 % 960 mL (0.8 mg/mL) chemo infusion, 8 mg/kg = 800 mg (100 % of original dose 8 mg/kg), Intravenous, Once, 1 of 1 cycle Dose modification: 8 mg/kg (original dose 8 mg/kg, Cycle 1, Reason: Provider Judgment, Comment: giving total dose 55m/kg over 2 days) daratumumab (DARZALEX) 800 mg in sodium chloride 0.9 % 460 mL (1.6 mg/mL) chemo infusion, 8 mg/kg = 800 mg (50 % of original dose 16 mg/kg), Intravenous, Once, 2 of 2 cycles Dose modification: 8 mg/kg (original dose 16 mg/kg, Cycle 1, Reason: Provider Judgment, Comment: giving 16 mg/kg over 2 days. thus 8 mg/kg in 2 divided doses.) Administration: 1,600 mg (04/19/2020), 1,600 mg (05/03/2020), 1,600 mg (05/09/2020), 800 mg (04/11/2020), 800 mg (04/12/2020) daratumumab (DARZALEX) 1,600 mg in sodium chloride 0.9 % 420 mL chemo infusion, 16 mg/kg = 1,600 mg, Intravenous, Once, 1 of 6 cycles Administration: 1,600 mg (05/16/2020), 1,600 mg (05/23/2020), 1,600 mg (05/30/2020), 1,600 mg (06/06/2020)  for chemotherapy  treatment.      CANCER STAGING: Cancer Staging No matching staging information was found for the patient.  INTERVAL HISTORY:  Tom Marshall a 69y.o. male, returns for routine follow-up and consideration for next cycle of chemotherapy. [name] last seen on 05/30/2020.  Due for cycle #3 of Darzalex today.   Overall, he tells me he has been feeling pretty well. He reports that he has used a week's worth of Pomalyst in his current bottle, though he is not taking any currently. He continues reporting proximal muscle weakness, though it is slowly improving. He is not taking prednisone at home. He checks his glucose every morning and his fasting BG this morning was in the 190s. He denies having any more falls since the last visit.  Overall, he feels ready for next cycle of chemo today.    REVIEW OF SYSTEMS:  Review of Systems  Constitutional: Positive for fatigue (moderate). Negative for appetite change.  Respiratory: Positive for cough.   Genitourinary: Positive for difficulty urinating.   All other systems reviewed and are negative.   PAST MEDICAL/SURGICAL HISTORY:  Past Medical History:  Diagnosis Date  . Cancer ([place]    multiple myeloma  . [place]   . Cervical dystonia   . Diabetes mellitus without complication ([facility]   . Gout   . Hypercholesteremia    Past Surgical History:  Procedure Laterality Date  . BONE MARROW BIOPSY    . CHOLECYSTECTOMY  2007  . COLONOSCOPY WITH PROPOFOL N/A 01/16/2018   Procedure: COLONOSCOPY WITH PROPOFOL;  Surgeon: RDaneil Dolin MD;  Location: [facility] END[facility];  Service: Endoscopy;  Laterality: N/A;  1:45pm  . ESOPHAGOGASTRODUODENOSCOPY (EGD) WITH PROPOFOL N/A 01/16/2018   Procedure: ESOPHAGOGASTRODUODENOSCOPY (EGD) WITH PROPOFOL;  Surgeon: Daneil Dolin, MD;  Location: [facility] END[facility] SUITE;  Service: Endoscopy;  Laterality: N/A;  . GIVENS CAPSULE STUDY N/A 05/12/2018   Procedure: GIVENS CAPSULE STUDY;  Surgeon: Daneil Dolin, MD;  Location: [facility] ENDO SUITE;  Service: Endoscopy;  Laterality: N/A;  7:30am  . NEPHRECTOMY Right 1998   cancer  . PORTACATH PLACEMENT Right 04/07/2020   Procedure: INSERTION PORT-A-CATH (attached catherter in right internal jugular);  Surgeon: Virl Cagey, MD;  Location: [facility] ORS;  Service: General;  Laterality: Right;    SOCIAL HISTORY:  Social History   Socioeconomic History  . Marital status: Single    Spouse name: Not on file  . Number of children: Not on file  . Years of education: Not on file  . Highest education level: Not on file  Occupational History  . Occupation: Marine scientist, Insurance underwriter  Tobacco Use  . Smoking status: Never Smoker  . Smokeless tobacco: Former Systems developer    Types: Secondary school teacher  . Vaping Use: Never used  Substance and Sexual Activity  . Alcohol use: No  . Drug use: No  . Sexual activity: Not Currently  Other Topics Concern  . Not on file  Social History Narrative  . Not on file   Social Determinants of Health   Financial Resource Strain:   . Difficulty of Paying Living Expenses:   Food Insecurity:   . Worried About Charity fundraiser in the Last Year:   . Arboriculturist in the Last Year:   Transportation Needs:   . Film/video editor (Medical):   [name] Lack of Transportation (Non-Medical):   Physical Activity:   . Days of Exercise per Week:   . Minutes of Exercise per Session:   Stress:   . Feeling of Stress :   Social Connections:   . Frequency of Communication with Friends and Family:   . Frequency of Social Gatherings with Friends and Family:   . Attends Religious Services:   . Active Member of Clubs or Organizations:   . Attends Archivist Meetings:   [name] Marital Status:   Intimate Partner Violence:   . Fear of Current or Ex-Partner:   . Emotionally Abused:   [name] Physically Abused:   . Sexually Abused:     FAMILY HISTORY:  Family History  Problem Relation Age of Onset  . Heart failure Mother 77  .  Dementia Father   . Colon cancer Neg Hx     CURRENT MEDICATIONS:  Current Outpatient Medications  Medication Sig Dispense Refill  . acyclovir (ZOVIRAX) 400 MG tablet Take 0.5 tablets (200 mg total) by mouth 2 (two) times daily. 60 tablet 4  . allopurinol (ZYLOPRIM) 300 MG tablet Take 300 mg by mouth every morning.    . B-D UF III MINI PEN NEEDLES 31G X 5 MM MISC   4  . DARATUMUMAB IV Inject into the vein. Weeks 1-8 weekly x 8 doses; then every 2 weeks x 8 doses; then every 28 days    . dicyclomine (BENTYL) 10 MG capsule Take 10 mg by mouth 2 (two) times daily as needed.     [name] ELIQUIS 2.5 MG TABS tablet TAKE 1 TABLET BY MOUTH TWICE DAILY AFTER COMPLETION OF THE STARTER PACK. (Patient taking differently: Take 2.5 mg by mouth daily. )  60 tablet 11  . furosemide (LASIX) 40 MG tablet Take 40 mg by mouth every Monday, Wednesday, and Friday.     . gabapentin (NEURONTIN) 300 MG capsule TAKE 1 CAPSULE BY MOUTH AT BEDTIME (Patient taking differently: Take 300 mg by mouth at bedtime. ) 30 capsule 1  . glipiZIDE (GLUCOTROL XL) 5 MG 24 hr tablet Take 5 mg by mouth daily with breakfast.     . HYDROcodone-acetaminophen (NORCO/VICODIN) 5-325 MG tablet Take 1 tablet by mouth every 4 (four) hours as needed for severe pain. 5 tablet 0  . lisinopril (ZESTRIL) 5 MG tablet Take 5 mg by mouth daily.    [name] lisinopril (ZESTRIL) 5 MG tablet Take 1 tablet by mouth daily.    . Multiple Vitamins-Minerals (CENTRUM SILVER 50+MEN) TABS Take 1 tablet by mouth every morning.    . pomalidomide (POMALYST) 3 MG capsule Take 1 capsule (3 mg total) by mouth daily. Take with water on days 1-21. Repeat every 28 days. 21 capsule 0  . pravastatin (PRAVACHOL) 20 MG tablet Take 20 mg by mouth at bedtime.     . sodium bicarbonate 650 MG tablet Take 1 tablet (650 mg total) by mouth 2 (two) times daily. (Patient taking differently: Take 1,300 mg by mouth 2 (two) times daily. ) 60 tablet 1  . tamsulosin (FLOMAX) 0.4 MG CAPS capsule Take 1  capsule (0.4 mg total) by mouth at bedtime. 30 capsule 3  . traZODone (DESYREL) 100 MG tablet Take 1 tablet (100 mg total) by mouth at bedtime. 30 tablet 3  . TRESIBA FLEXTOUCH 200 UNIT/ML SOPN Inject 44 Units into the skin daily.     . TRUE METRIX BLOOD GLUCOSE TEST test strip     . cyclobenzaprine (FLEXERIL) 10 MG tablet TAKE 1 TABLET THREE TIMES DAILY AS NEEDED FOR MUSCLE SPASM (Patient not taking: Reported on 06/13/2020) 30 tablet 0  . lidocaine-prilocaine (EMLA) cream Apply small amount over port site 1 hour prior to appointment.  Cover with plastic wrap. (Patient not taking: Reported on 06/13/2020) 30 g 3  . prochlorperazine (COMPAZINE) 10 MG tablet Take 1 tablet (10 mg total) by mouth every 6 (six) hours as needed (Nausea or vomiting). (Patient not taking: Reported on 06/13/2020) 30 tablet 1   No current facility-administered medications for this visit.   Facility-Administered Medications Ordered in Other Visits  Medication Dose Route Frequency Provider Last Rate Last Admin  . 0.9 %  sodium chloride infusion  250 mL Intravenous Once Twana First, MD        ALLERGIES:  No Known Allergies  PHYSICAL EXAM:  Performance status (ECOG): 1 - Symptomatic but completely ambulatory  There were no vitals filed for this visit. Wt Readings from Last 3 Encounters:  06/06/20 223 lb 8 oz (101.4 kg)  05/30/20 (!) 221 lb 1.6 oz (100.3 kg)  05/23/20 (!) 222 lb 4.8 oz (100.8 kg)   Physical Exam Vitals reviewed.  Constitutional:      Appearance: Normal appearance. He is obese.  Cardiovascular:     Rate and Rhythm: Normal rate and regular rhythm.     Pulses: Normal pulses.     Heart sounds: Normal heart sounds.  Pulmonary:     Effort: Pulmonary effort is normal.     Breath sounds: Normal breath sounds.  Neurological:     General: No focal deficit present.     Mental Status: He is alert and oriented to person, place, and time.  Psychiatric:        Mood  and Affect: Mood normal.        Behavior:  Behavior normal.     LABORATORY DATA:  I have reviewed the labs as listed.  CBC Latest Ref Rng & Units 06/13/2020 06/06/2020 05/30/2020  WBC 4.0 - 10.5 K/uL 7.7 8.4 7.7  Hemoglobin 13.0 - 17.0 g/dL 9.8(L) 9.4(L) 9.3(L)  Hematocrit 39 - 52 % 29.4(L) 27.9(L) 28.3(L)  Platelets 150 - 400 K/uL 94(L) 99(L) 100(L)   CMP Latest Ref Rng & Units 06/13/2020 06/06/2020 05/30/2020  Glucose 70 - 99 mg/dL 391(H) 239(H) 326(H)  BUN 8 - 23 mg/dL 24(H) 28(H) 34(H)  Creatinine 0.61 - 1.24 mg/dL 2.46(H) 2.29(H) 2.35(H)  Sodium 135 - 145 mmol/L 135 135 136  Potassium 3.5 - 5.1 mmol/L 4.0 3.7 3.6  Chloride 98 - 111 mmol/L 97(L) 98 99  CO2 22 - 32 mmol/L '26 25 26  ' Calcium 8.9 - 10.3 mg/dL 8.9 8.8(L) 8.8(L)  Total Protein 6.5 - 8.1 g/dL 6.1(L) 6.0(L) 5.8(L)  Total Bilirubin 0.3 - 1.2 mg/dL 0.7 0.6 1.1  Alkaline Phos 38 - 126 U/L 93 101 106  AST 15 - 41 U/L 17 12(L) 14(L)  ALT 0 - 44 U/L '24 23 25   ' Lab Results  Component Value Date   LDH 120 06/06/2020   LDH 118 05/30/2020   LDH 121 05/03/2020   Lab Results  Component Value Date   TOTALPROTELP 5.5 (L) 06/06/2020   ALBUMINELP 3.3 06/06/2020   A1GS 0.2 06/06/2020   A2GS 0.9 06/06/2020   BETS 0.9 06/06/2020   GAMS 0.2 (L) 06/06/2020   MSPIKE 0.1 (H) 06/06/2020   SPEI Comment 06/06/2020    Lab Results  Component Value Date   KPAFRELGTCHN 922.9 (H) 06/06/2020   LAMBDASER 4.3 (L) 06/06/2020   KAPLAMBRATIO 214.63 (H) 06/06/2020    DIAGNOSTIC IMAGING:  I have independently reviewed the scans and discussed with the patient. No results found.   ASSESSMENT:  1. IgG kappa multiple myeloma: -4 cycles of RVD from 08/30/2017 through 02/18/2018. -Declined bone marrow transplant. Received maintenance Revlimid 2.5 mg 3 weeks on 1 week off. -Bone marrow biopsy on 02/23/2020 shows 21% plasma cells. Occasional sections had up to 75% myeloma cells. -Chromosome analysis was normal. FISH panel was positive for gain of 1 q., monosomy 13, del 17 P, t(14;20) -PET  scan showed new left frontal destructive lesion. Numerous areas of lytic changes in the spine. Lucent areas in T6 with loss of height at T5. Numerous lytic changes throughout the spine noted along with pedicle and lamina and transverse process of T8 vertebral body. Profound hypermetabolic activity within the ribs bilaterally.  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 -Completed XRT on 04/05/2020.  3. 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 He is on Eliquis.   PLAN:  1. IgG kappa multiple myeloma: -I have reviewed his myeloma panel from 06/06/2020.  M spike is 0.1 g.  However his kappa light chains increased to 922 and ratio increased to 214. -I have recommended restarting pomalidomide.  However due to better tolerability, we will start at 2 mg dose, 3 weeks on 1 week off. -He will proceed with Darzalex today.  I will see him back in 2 weeks for follow-up.  2. CKD: -Creatinine is slightly worse at 2.46.  3. Left leg DVT: -Continue Eliquis 2.5 mg daily.  No bleeding issues.  4.Diabetes: -Continue Tresiba.  His blood sugar is 391.  They continue to be elevated. -I will increase glipizide to 10 mg daily.  5. Insomnia: -Continue trazodone 100 mg at bedtime.  6. Lower extremity swelling: -Continue Lasix 40 mg daily.  7.  Urinary hesitancy: -Continue Flomax 0.4 mg daily.   Orders placed this encounter:  No orders of the defined types were placed in this encounter.    Derek Jack, MD [place] [phone]   I, Milinda Antis, am acting as a scribe for Dr. Sanda Linger.  I, Derek Jack MD, have reviewed the above documentation for accuracy and completeness, and I agree with the above.

## 2020-06-13 NOTE — Progress Notes (Signed)
Patient has been assessed by Dr. Delton Coombes. Labs have been reviewed. Dr. Delton Coombes is aware of elevated creatinine level.  He is aware of elevated glucose.  Orders received for 10 units insulin IV x 1 dose today. He is increasing his glipizide to 10mg  daily.  That has been sent his pharmacy.  He is also restarting him on Pomalyst 2 mg.  He is okay to proceed with treatment.  Primary RN aware.

## 2020-06-13 NOTE — Patient Instructions (Addendum)
[place] at [facility] Discharge Instructions  You were seen today by Dr. Delton Coombes. He went over your recent results. You received your treatment today; continue getting your treatments weekly. You will be prescribed Pomalyst 2 mg to take daily. Start taking 2 tablets of glipizide daily. Dr. Delton Coombes will see you back in 2 weeks for labs and follow up.   Thank you for choosing [place] at [facility] to provide your oncology and hematology care.  To afford each patient quality time with our provider, please arrive at least 15 minutes before your scheduled appointment time.   If you have a lab appointment with the [facility] please come in thru the Main Entrance and check in at the main information desk  You need to re-schedule your appointment should you arrive 10 or more minutes late.  We strive to give you quality time with our providers, and arriving late affects you and other patients whose appointments are after yours.  Also, if you no show three or more times for appointments you may be dismissed from the clinic at the providers discretion.     Again, thank you for choosing [facility].  Our hope is that these requests will decrease the amount of time that you wait before being seen by our physicians.       _____________________________________________________________  Should you have questions after your visit to [facility], please contact our office at [phone] between the hours of 8:00 a.m. and 4:30 p.m.  Voicemails left after 4:00 p.m. will not be returned until the following business day.  For prescription refill requests, have your pharmacy contact our office and allow 72 hours.    Cancer Center Support Programs:   > Cancer Support Group  2nd Tuesday of the month 1pm-2pm, Journey Room

## 2020-06-14 ENCOUNTER — Other Ambulatory Visit (HOSPITAL_COMMUNITY): Payer: Self-pay | Admitting: *Deleted

## 2020-06-14 ENCOUNTER — Other Ambulatory Visit (HOSPITAL_COMMUNITY): Payer: Self-pay

## 2020-06-14 DIAGNOSIS — E1169 Type 2 diabetes mellitus with other specified complication: Secondary | ICD-10-CM

## 2020-06-14 DIAGNOSIS — D696 Thrombocytopenia, unspecified: Secondary | ICD-10-CM

## 2020-06-14 DIAGNOSIS — E669 Obesity, unspecified: Secondary | ICD-10-CM

## 2020-06-14 NOTE — Progress Notes (Signed)
Received request from Dr. Ria Comment office for hemoglobin A1C check. Order placed for A1C to be checked on 8/30.

## 2020-06-20 DIAGNOSIS — C9000 Multiple myeloma not having achieved remission: Secondary | ICD-10-CM | POA: Diagnosis not present

## 2020-06-20 DIAGNOSIS — I10 Essential (primary) hypertension: Secondary | ICD-10-CM | POA: Diagnosis not present

## 2020-06-20 DIAGNOSIS — N184 Chronic kidney disease, stage 4 (severe): Secondary | ICD-10-CM | POA: Diagnosis not present

## 2020-06-20 DIAGNOSIS — M109 Gout, unspecified: Secondary | ICD-10-CM | POA: Diagnosis not present

## 2020-06-20 DIAGNOSIS — D696 Thrombocytopenia, unspecified: Secondary | ICD-10-CM | POA: Diagnosis not present

## 2020-06-20 DIAGNOSIS — E1129 Type 2 diabetes mellitus with other diabetic kidney complication: Secondary | ICD-10-CM | POA: Diagnosis not present

## 2020-06-20 DIAGNOSIS — Z79899 Other long term (current) drug therapy: Secondary | ICD-10-CM | POA: Diagnosis not present

## 2020-06-20 DIAGNOSIS — E785 Hyperlipidemia, unspecified: Secondary | ICD-10-CM | POA: Diagnosis not present

## 2020-06-20 DIAGNOSIS — Z125 Encounter for screening for malignant neoplasm of prostate: Secondary | ICD-10-CM | POA: Diagnosis not present

## 2020-06-23 DIAGNOSIS — Z0001 Encounter for general adult medical examination with abnormal findings: Secondary | ICD-10-CM | POA: Diagnosis not present

## 2020-06-23 DIAGNOSIS — D696 Thrombocytopenia, unspecified: Secondary | ICD-10-CM | POA: Diagnosis not present

## 2020-06-23 DIAGNOSIS — C9000 Multiple myeloma not having achieved remission: Secondary | ICD-10-CM | POA: Diagnosis not present

## 2020-06-23 DIAGNOSIS — N184 Chronic kidney disease, stage 4 (severe): Secondary | ICD-10-CM | POA: Diagnosis not present

## 2020-06-23 DIAGNOSIS — Z6841 Body Mass Index (BMI) 40.0 and over, adult: Secondary | ICD-10-CM | POA: Diagnosis not present

## 2020-06-23 DIAGNOSIS — E1122 Type 2 diabetes mellitus with diabetic chronic kidney disease: Secondary | ICD-10-CM | POA: Diagnosis not present

## 2020-06-23 DIAGNOSIS — I700 Atherosclerosis of aorta: Secondary | ICD-10-CM | POA: Diagnosis not present

## 2020-06-23 DIAGNOSIS — M4830 Traumatic spondylopathy, site unspecified: Secondary | ICD-10-CM | POA: Diagnosis not present

## 2020-06-27 ENCOUNTER — Other Ambulatory Visit: Payer: Self-pay

## 2020-06-27 ENCOUNTER — Inpatient Hospital Stay (HOSPITAL_BASED_OUTPATIENT_CLINIC_OR_DEPARTMENT_OTHER): Payer: Medicare Other | Admitting: Hematology

## 2020-06-27 ENCOUNTER — Inpatient Hospital Stay (HOSPITAL_COMMUNITY): Payer: Medicare Other

## 2020-06-27 ENCOUNTER — Encounter (HOSPITAL_COMMUNITY): Payer: Self-pay | Admitting: Hematology

## 2020-06-27 VITALS — BP 118/52 | HR 77 | Temp 97.9°F | Resp 18

## 2020-06-27 VITALS — BP 131/73 | HR 91 | Temp 98.3°F | Resp 18 | Wt 212.0 lb

## 2020-06-27 DIAGNOSIS — C9000 Multiple myeloma not having achieved remission: Secondary | ICD-10-CM

## 2020-06-27 DIAGNOSIS — Z9221 Personal history of antineoplastic chemotherapy: Secondary | ICD-10-CM | POA: Diagnosis not present

## 2020-06-27 DIAGNOSIS — N189 Chronic kidney disease, unspecified: Secondary | ICD-10-CM | POA: Diagnosis not present

## 2020-06-27 DIAGNOSIS — E1122 Type 2 diabetes mellitus with diabetic chronic kidney disease: Secondary | ICD-10-CM | POA: Diagnosis not present

## 2020-06-27 DIAGNOSIS — Z5112 Encounter for antineoplastic immunotherapy: Secondary | ICD-10-CM | POA: Diagnosis not present

## 2020-06-27 DIAGNOSIS — Z923 Personal history of irradiation: Secondary | ICD-10-CM | POA: Diagnosis not present

## 2020-06-27 LAB — COMPREHENSIVE METABOLIC PANEL
ALT: 21 U/L (ref 0–44)
AST: 13 U/L — ABNORMAL LOW (ref 15–41)
Albumin: 3.5 g/dL (ref 3.5–5.0)
Alkaline Phosphatase: 82 U/L (ref 38–126)
Anion gap: 10 (ref 5–15)
BUN: 29 mg/dL — ABNORMAL HIGH (ref 8–23)
CO2: 25 mmol/L (ref 22–32)
Calcium: 8.9 mg/dL (ref 8.9–10.3)
Chloride: 100 mmol/L (ref 98–111)
Creatinine, Ser: 2.62 mg/dL — ABNORMAL HIGH (ref 0.61–1.24)
GFR calc Af Amer: 27 mL/min — ABNORMAL LOW (ref >60)
GFR calc non Af Amer: 24 mL/min — ABNORMAL LOW (ref >60)
Glucose, Bld: 229 mg/dL — ABNORMAL HIGH (ref 70–99)
Potassium: 3.7 mmol/L (ref 3.5–5.1)
Sodium: 135 mmol/L (ref 135–145)
Total Bilirubin: 0.5 mg/dL (ref 0.3–1.2)
Total Protein: 5.8 g/dL — ABNORMAL LOW (ref 6.5–8.1)

## 2020-06-27 LAB — CBC WITH DIFFERENTIAL/PLATELET
Abs Immature Granulocytes: 0.04 10*3/uL (ref 0.00–0.07)
Basophils Absolute: 0 10*3/uL (ref 0.0–0.1)
Basophils Relative: 0 %
Eosinophils Absolute: 0.1 10*3/uL (ref 0.0–0.5)
Eosinophils Relative: 1 %
HCT: 27.7 % — ABNORMAL LOW (ref 39.0–52.0)
Hemoglobin: 8.8 g/dL — ABNORMAL LOW (ref 13.0–17.0)
Immature Granulocytes: 1 %
Lymphocytes Relative: 12 %
Lymphs Abs: 0.7 10*3/uL (ref 0.7–4.0)
MCH: 32.7 pg (ref 26.0–34.0)
MCHC: 31.8 g/dL (ref 30.0–36.0)
MCV: 103 fL — ABNORMAL HIGH (ref 80.0–100.0)
Monocytes Absolute: 0.4 10*3/uL (ref 0.1–1.0)
Monocytes Relative: 7 %
Neutro Abs: 4.3 10*3/uL (ref 1.7–7.7)
Neutrophils Relative %: 79 %
Platelets: 70 10*3/uL — ABNORMAL LOW (ref 150–400)
RBC: 2.69 MIL/uL — ABNORMAL LOW (ref 4.22–5.81)
RDW: 16.6 % — ABNORMAL HIGH (ref 11.5–15.5)
WBC: 5.4 10*3/uL (ref 4.0–10.5)
nRBC: 0 % (ref 0.0–0.2)

## 2020-06-27 MED ORDER — SODIUM CHLORIDE 0.9 % IV SOLN
16.0000 mg/kg | Freq: Once | INTRAVENOUS | Status: AC
  Administered 2020-06-27: 1600 mg via INTRAVENOUS
  Filled 2020-06-27: qty 80

## 2020-06-27 MED ORDER — ACETAMINOPHEN 325 MG PO TABS
650.0000 mg | ORAL_TABLET | Freq: Once | ORAL | Status: AC
  Administered 2020-06-27: 650 mg via ORAL
  Filled 2020-06-27: qty 2

## 2020-06-27 MED ORDER — SODIUM CHLORIDE 0.9 % IV SOLN: Freq: Once | INTRAVENOUS | Status: AC

## 2020-06-27 MED ORDER — SODIUM CHLORIDE 0.9% FLUSH
10.0000 mL | INTRAVENOUS | Status: DC | PRN
  Administered 2020-06-27: 10 mL

## 2020-06-27 MED ORDER — DEXAMETHASONE 4 MG PO TABS
20.0000 mg | ORAL_TABLET | ORAL | 3 refills | Status: AC
Start: 2020-06-27 — End: ?

## 2020-06-27 MED ORDER — SODIUM CHLORIDE 0.9 % IV SOLN
20.0000 mg | Freq: Once | INTRAVENOUS | Status: AC
  Administered 2020-06-27: 20 mg via INTRAVENOUS
  Filled 2020-06-27: qty 20

## 2020-06-27 MED ORDER — DIPHENHYDRAMINE HCL 25 MG PO CAPS
50.0000 mg | ORAL_CAPSULE | Freq: Once | ORAL | Status: AC
  Administered 2020-06-27: 50 mg via ORAL
  Filled 2020-06-27: qty 2

## 2020-06-27 MED ORDER — HEPARIN SOD (PORK) LOCK FLUSH 100 UNIT/ML IV SOLN
500.0000 [IU] | Freq: Once | INTRAVENOUS | Status: AC | PRN
  Administered 2020-06-27: 500 [IU]

## 2020-06-27 MED ORDER — FAMOTIDINE IN NACL 20-0.9 MG/50ML-% IV SOLN
20.0000 mg | Freq: Once | INTRAVENOUS | Status: AC
  Administered 2020-06-27: 20 mg via INTRAVENOUS
  Filled 2020-06-27: qty 50

## 2020-06-27 NOTE — Progress Notes (Signed)
[place]  [address] [place], [place] [address]   CLINIC:  Medical Oncology/Hematology  PCP:  Asencion Noble, MD [address] / [place] [address] [phone]   REASON FOR VISIT:  Follow-up for multiple myeloma  PRIOR THERAPY: Revlimid x 4 cycles from 09/05/2017 to 02/18/2018 with Revlimid maintenance  NGS Results: Not done  CURRENT THERAPY: Darzalex every 2 weeks  BRIEF ONCOLOGIC HISTORY:  Oncology History  Multiple myeloma ([place])  08/29/2017 Initial Diagnosis   Multiple myeloma ([place])   12/10/2017 - 03/31/2018 Chemotherapy   The patient had bortezomib SQ (VELCADE) chemo injection 2.75 mg, 1.3 mg/m2 = 2.75 mg, Subcutaneous,  Once, 5 of 5 cycles Administration: 2.75 mg (12/10/2017), 2.75 mg (12/17/2017), 2.75 mg (12/31/2017), 2.75 mg (01/21/2018), 2.75 mg (02/18/2018), 2.75 mg (03/11/2018)  for chemotherapy treatment.    04/11/2020 -  Chemotherapy   The patient had daratumumab (DARZALEX) 800 mg in sodium chloride 0.9 % 960 mL (0.8 mg/mL) chemo infusion, 8 mg/kg = 800 mg (100 % of original dose 8 mg/kg), Intravenous, Once, 1 of 1 cycle Dose modification: 8 mg/kg (original dose 8 mg/kg, Cycle 1, Reason: Provider Judgment, Comment: giving total dose 52m/kg over 2 days) daratumumab (DARZALEX) 800 mg in sodium chloride 0.9 % 460 mL (1.6 mg/mL) chemo infusion, 8 mg/kg = 800 mg (50 % of original dose 16 mg/kg), Intravenous, Once, 2 of 2 cycles Dose modification: 8 mg/kg (original dose 16 mg/kg, Cycle 1, Reason: Provider Judgment, Comment: giving 16 mg/kg over 2 days. thus 8 mg/kg in 2 divided doses.) Administration: 1,600 mg (04/19/2020), 1,600 mg (05/03/2020), 1,600 mg (05/09/2020), 800 mg (04/11/2020), 800 mg (04/12/2020) daratumumab (DARZALEX) 1,600 mg in sodium chloride 0.9 % 420 mL chemo infusion, 16 mg/kg = 1,600 mg, Intravenous, Once, 2 of 6 cycles Administration: 1,600 mg (05/16/2020), 1,600 mg (05/23/2020), 1,600 mg (05/30/2020), 1,600 mg (06/06/2020), 1,600 mg  (06/13/2020)  for chemotherapy treatment.      CANCER STAGING: Cancer Staging No matching staging information was found for the patient.  INTERVAL HISTORY:  [name] a 69y.o. male, returns for routine follow-up and consideration for next cycle of chemotherapy. WJamilewas last seen on 06/13/2020.  Due for day #15 of cycle #3 of Darzalex today.   Overall, today he tells me he has been feeling pretty well. He started taking Pomalyst 2 mg on 8/23. He reports having more fatigue since starting the last treatment; he reports that his balance is "off" and almost fell, but caught himself. He continues complaining of tingling in his hands and his PCP has increased his [place] and [place] 50 units QD and Novolog 8 units BID. He is taking the Lasix daily and denies hematochezia or hematuria.  Overall, he feels ready for next cycle of chemo today.    REVIEW OF SYSTEMS:  Review of Systems  Constitutional: Positive for fatigue (moderate). Negative for appetite change.  Gastrointestinal: Negative for blood in stool.  Genitourinary: Negative for hematuria.   Musculoskeletal: Positive for back pain (4/10 lower back pain) and gait problem.  Neurological: Positive for gait problem and numbness (tingling in hands).  All other systems reviewed and are negative.   PAST MEDICAL/SURGICAL HISTORY:  Past Medical History:  Diagnosis Date  . Cancer ([place]    multiple myeloma  . Cancer of right kidney ([place]   . Cervical dystonia   . Diabetes mellitus without complication ([facility]   . Gout   . Hypercholesteremia    Past Surgical History:  Procedure  Laterality Date  . BONE MARROW BIOPSY    . CHOLECYSTECTOMY  2007  . COLONOSCOPY WITH PROPOFOL N/A 01/16/2018   Procedure: COLONOSCOPY WITH PROPOFOL;  Surgeon: Daneil Dolin, MD;  Location[facility];  Service: Endoscopy;  Laterality: N/A;  1:45pm  . ESOPHAGOGASTRODUODENOSCOPY (EGD) WITH PROPOFOL N/A 01/16/2018   Procedure: ESOPHAGOGASTRODUODENOSCOPY  (EGD) WITH PROPOFOL;  Surgeon: Daneil Dolin, MD;  Location: [facility];  Service: Endoscopy;  Laterality: N/A;  . GIVENS CAPSULE STUDY N/A 05/12/2018   Procedure: GIVENS CAPSULE STUDY;  Surgeon: Daneil Dolin, MD;  Location: [facility];  Service: Endoscopy;  Laterality: N/A;  7:30am  . NEPHRECTOMY Right 1998   cancer  . PORTACATH PLACEMENT Right 04/07/2020   Procedure: INSERTION PORT-A-CATH (attached catherter in right internal jugular);  Surgeon: Virl Cagey, MD;  Location: [facility];  Service: General;  Laterality: Right;    SOCIAL HISTORY:  Social History   Socioeconomic History  . Marital status: Single    Spouse name: Not on file  . Number of children: Not on file  . Years of education: Not on file  . Highest education level: Not on file  Occupational History  . Occupation: Marine scientist, Insurance underwriter  Tobacco Use  . Smoking status: Never Smoker  . Smokeless tobacco: Former Systems developer    Types: Secondary school teacher  . Vaping Use: Never used  Substance and Sexual Activity  . Alcohol use: No  . Drug use: No  . Sexual activity: Not Currently  Other Topics Concern  . Not on file  Social History Narrative  . Not on file   Social Determinants of Health   Financial Resource Strain:   . Difficulty of Paying Living Expenses: Not on file  Food Insecurity:   . Worried About Charity fundraiser in the Last Year: Not on file  . Ran Out of Food in the Last Year: Not on file  Transportation Needs:   . Lack of Transportation (Medical): Not on file  . Lack of Transportation (Non-Medical): Not on file  Physical Activity:   . Days of Exercise per Week: Not on file  . Minutes of Exercise per Session: Not on file  Stress:   . Feeling of Stress : Not on file  Social Connections:   . Frequency of Communication with Friends and Family: Not on file  . Frequency of Social Gatherings with Friends and Family: Not on file  . Attends Religious Services: Not on file  . Active Member of  Clubs or Organizations: Not on file  . Attends Archivist Meetings: Not on file  . Marital Status: Not on file  Intimate Partner Violence:   . Fear of Current or Ex-Partner: Not on file  . Emotionally Abused: Not on file  . Physically Abused: Not on file  . Sexually Abused: Not on file    FAMILY HISTORY:  Family History  Problem Relation Age of Onset  . Heart failure Mother 7  . Dementia Father   . Colon cancer Neg Hx     CURRENT MEDICATIONS:  Current Outpatient Medications  Medication Sig Dispense Refill  . acyclovir (ZOVIRAX) 400 MG tablet Take 0.5 tablets (200 mg total) by mouth 2 (two) times daily. 60 tablet 4  . allopurinol (ZYLOPRIM) 300 MG tablet Take 300 mg by mouth every morning.    . B-D UF III MINI PEN NEEDLES 31G X 5 MM MISC   4  . cyclobenzaprine (FLEXERIL) 10 MG tablet TAKE  1 TABLET THREE TIMES DAILY AS NEEDED FOR MUSCLE SPASM 30 tablet 0  . DARATUMUMAB IV Inject into the vein. Weeks 1-8 weekly x 8 doses; then every 2 weeks x 8 doses; then every 28 days    . dicyclomine (BENTYL) 10 MG capsule Take 10 mg by mouth 2 (two) times daily as needed.     [name] ELIQUIS 2.5 MG TABS tablet TAKE 1 TABLET BY MOUTH TWICE DAILY AFTER COMPLETION OF THE STARTER PACK. (Patient taking differently: Take 2.5 mg by mouth daily. ) 60 tablet 11  . furosemide (LASIX) 40 MG tablet Take 40 mg by mouth every Monday, Wednesday, and Friday.     . gabapentin (NEURONTIN) 300 MG capsule TAKE 1 CAPSULE BY MOUTH AT BEDTIME (Patient taking differently: Take 300 mg by mouth at bedtime. ) 30 capsule 1  . glipiZIDE (GLIPIZIDE XL) 10 MG 24 hr tablet Take 1 tablet (10 mg total) by mouth daily with breakfast. 30 tablet 2  . lidocaine-prilocaine (EMLA) cream Apply small amount over port site 1 hour prior to appointment.  Cover with plastic wrap. 30 g 3  . Multiple Vitamins-Minerals (CENTRUM SILVER 50+MEN) TABS Take 1 tablet by mouth every morning.    [name] NOVOLOG FLEXPEN 100 UNIT/ML FlexPen Inject into the  skin.    . pomalidomide (POMALYST) 2 MG capsule Take 1 capsule (2 mg total) by mouth daily. Take with water on days 1-21. Repeat every 28 days. 21 capsule 0  . pravastatin (PRAVACHOL) 20 MG tablet Take 20 mg by mouth at bedtime.     . prochlorperazine (COMPAZINE) 10 MG tablet Take 1 tablet (10 mg total) by mouth every 6 (six) hours as needed (Nausea or vomiting). 30 tablet 1  . sodium bicarbonate 650 MG tablet Take 1 tablet (650 mg total) by mouth 2 (two) times daily. (Patient taking differently: Take 1,300 mg by mouth 2 (two) times daily. ) 60 tablet 1  . tamsulosin (FLOMAX) 0.4 MG CAPS capsule Take 1 capsule (0.4 mg total) by mouth at bedtime. 30 capsule 3  . traZODone (DESYREL) 100 MG tablet Take 1 tablet (100 mg total) by mouth at bedtime. 30 tablet 3  . TRESIBA FLEXTOUCH 200 UNIT/ML SOPN Inject 44 Units into the skin daily.     . TRUE METRIX BLOOD GLUCOSE TEST test strip     . dexamethasone (DECADRON) 4 MG tablet Take 5 tablets (20 mg total) by mouth once a week. 20 tablet 3  . HYDROcodone-acetaminophen (NORCO/VICODIN) 5-325 MG tablet Take 1 tablet by mouth every 4 (four) hours as needed for severe pain. (Patient not taking: Reported on 06/27/2020) 5 tablet 0   No current facility-administered medications for this visit.   Facility-Administered Medications Ordered in Other Visits  Medication Dose Route Frequency Provider Last Rate Last Admin  . 0.9 %  sodium chloride infusion  250 mL Intravenous Once Twana First, MD        ALLERGIES:  No Known Allergies  PHYSICAL EXAM:  Performance status (ECOG): 1 - Symptomatic but completely ambulatory  Vitals:   06/27/20 0914  BP: 131/73  Pulse: 91  Resp: 18  Temp: 98.3 F (36.8 C)  SpO2: 92%   Wt Readings from Last 3 Encounters:  06/27/20 212 lb (96.2 kg)  06/27/20 212 lb 4.8 oz (96.3 kg)  06/06/20 223 lb 8 oz (101.4 kg)   Physical Exam Vitals reviewed.  Constitutional:      Appearance: Normal appearance. He is obese.    Cardiovascular:     Rate  and Rhythm: Normal rate and regular rhythm.     Pulses: Normal pulses.     Heart sounds: Normal heart sounds.  Pulmonary:     Effort: Pulmonary effort is normal.     Breath sounds: Normal breath sounds.  Chest:     Comments: Port-a-Cath in R chest Musculoskeletal:     Right lower leg: Edema (1+) present.     Left lower leg: Edema (1+) present.  Neurological:     General: No focal deficit present.     Mental Status: He is alert and oriented to person, place, and time.  Psychiatric:        Mood and Affect: Mood normal.        Behavior: Behavior normal.     LABORATORY DATA:  I have reviewed the labs as listed.  CBC Latest Ref Rng & Units 06/27/2020 06/13/2020 06/06/2020  WBC 4.0 - 10.5 K/uL 5.4 7.7 8.4  Hemoglobin 13.0 - 17.0 g/dL 8.8(L) 9.8(L) 9.4(L)  Hematocrit 39 - 52 % 27.7(L) 29.4(L) 27.9(L)  Platelets 150 - 400 K/uL 70(L) 94(L) 99(L)   CMP Latest Ref Rng & Units 06/27/2020 06/13/2020 06/06/2020  Glucose 70 - 99 mg/dL 229(H) 391(H) 239(H)  BUN 8 - 23 mg/dL 29(H) 24(H) 28(H)  Creatinine 0.61 - 1.24 mg/dL 2.62(H) 2.46(H) 2.29(H)  Sodium 135 - 145 mmol/L 135 135 135  Potassium 3.5 - 5.1 mmol/L 3.7 4.0 3.7  Chloride 98 - 111 mmol/L 100 97(L) 98  CO2 22 - 32 mmol/L '25 26 25  ' Calcium 8.9 - 10.3 mg/dL 8.9 8.9 8.8(L)  Total Protein 6.5 - 8.1 g/dL 5.8(L) 6.1(L) 6.0(L)  Total Bilirubin 0.3 - 1.2 mg/dL 0.5 0.7 0.6  Alkaline Phos 38 - 126 U/L 82 93 101  AST 15 - 41 U/L 13(L) 17 12(L)  ALT 0 - 44 U/L '21 24 23    ' DIAGNOSTIC IMAGING:  I have independently reviewed the scans and discussed with the patient. No results found.   ASSESSMENT:  1. IgG kappa multiple myeloma: -4 cycles of RVD from 08/30/2017 through 02/18/2018. -Declined bone marrow transplant. Received maintenance Revlimid 2.5 mg 3 weeks on 1 week off. -Bone marrow biopsy on 02/23/2020 shows 21% plasma cells. Occasional sections had up to 75% myeloma cells. -Chromosome analysis was normal. FISH  panel was positive for gain of 1 q., monosomy 13, del 17 P, t(14;20) -PET scan showed new left frontal destructive lesion. Numerous areas of lytic changes in the spine. Lucent areas in T6 with loss of height at T5. Numerous lytic changes throughout the spine noted along with pedicle and lamina and transverse process of T8 vertebral body. Profound hypermetabolic activity within the ribs bilaterally. -Pomalidomide 2 mg 3 weeks on 1 week off started on 06/20/2020.  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 -Completed XRT on 04/05/2020.  3. 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 He is on Eliquis.   PLAN:  1. IgG kappa multiple myeloma: -He is tolerating Pomalyst 2 mg reasonably well.  He has been taking it for the last 1 week. -He lost 9 pounds in the last 4 weeks.  I reviewed his LFTs which are normal.  Creatinine increased to 2.62.  BUN is 29.  CBC shows platelet count 70.  White count is 5.4 with  normal ANC.  Globin is 8.8. -He will proceed with his treatment today.  I plan to see him back in 3 weeks.  We will plan to repeat myeloma labs in 2 weeks.  2. CKD: -Creatinine increased to 2.6.  Recommended increasing fluid intake.  3. Left leg DVT: -Continue Eliquis 2.5 mg daily.  No bleeding issues.  4.Diabetes: -Tresiba dose was increased to 50 units.  He was also started on NovoLog 8 units twice daily.  5. Insomnia: -Continue trazodone 100 mg at bedtime.  6. Lower extremity swelling: -Continue Lasix 40 mg daily.  7. Urinary hesitancy: -Continue Flomax 0.4 mg daily.   Orders placed this encounter:  No orders of the defined types were placed in this encounter.    Derek Jack, MD [place] [phone]   I, Milinda Antis, am acting as a scribe for Dr. Sanda Linger.  I, Derek Jack MD, have reviewed the above documentation for accuracy and completeness, and I agree with the above.

## 2020-06-27 NOTE — Progress Notes (Signed)
Patient presents today for treatment and follow up visit with Dr. Delton Coombes. Vital signs stable. Message received from [place] RN/ Dr. Delton Coombes to proceed with treatment. Labs reviewed by MD. Creatinine 2.62, Platelets 70. MD aware.   Treatment given today per MD orders. Tolerated infusion without adverse affects. Vital signs stable. No complaints at this time. Discharged from clinic ambulatory. F/U with [facility] as scheduled.

## 2020-06-27 NOTE — Patient Instructions (Signed)
[facility] Discharge Instructions for Patients Receiving Chemotherapy  Today you received the following chemotherapy agents   To help prevent nausea and vomiting after your treatment, we encourage you to take your nausea medication   If you develop nausea and vomiting that is not controlled by your nausea medication, call the clinic.   BELOW ARE SYMPTOMS THAT SHOULD BE REPORTED IMMEDIATELY:  *FEVER GREATER THAN 100.5 F  *CHILLS WITH OR WITHOUT FEVER  NAUSEA AND VOMITING THAT IS NOT CONTROLLED WITH YOUR NAUSEA MEDICATION  *UNUSUAL SHORTNESS OF BREATH  *UNUSUAL BRUISING OR BLEEDING  TENDERNESS IN MOUTH AND THROAT WITH OR WITHOUT PRESENCE OF ULCERS  *URINARY PROBLEMS  *BOWEL PROBLEMS  UNUSUAL RASH Items with * indicate a potential emergency and should be followed up as soon as possible.  Feel free to call the clinic should you have any questions or concerns. The clinic phone number is [phone].  Please show the CHEMO ALERT CARD at check-in to the Emergency Department and triage nurse.   

## 2020-06-27 NOTE — Progress Notes (Signed)
Patient was assessed by Dr. Delton Coombes and labs have been reviewed. Kidney functions reviewed, patient encouraged to increase fluids by mouth.  Dr. Delton Coombes aware of 9 lb weight loss.  No changes to treatment.  Patient is okay to proceed with treatment today. Primary RN and pharmacy aware.

## 2020-06-27 NOTE — Patient Instructions (Signed)
[place] at [facility] Discharge Instructions  You were seen today by Dr. Delton Coombes. He went over your recent results. You received your treatment today; your next treatment will be in 2 weeks. You will be prescribed dexamethasone 20 mg (5 tablets of 4 mg) to take once a week on the weeks that you are not receiving chemo treatments. Drink at least 60 ounces of water daily to prevent kidney injury. Dr. Delton Coombes will see you back in 4 weeks for labs and follow up.   Thank you for choosing [place] at [facility] to provide your oncology and hematology care.  To afford each patient quality time with our provider, please arrive at least 15 minutes before your scheduled appointment time.   If you have a lab appointment with the [facility] please come in thru the Main Entrance and check in at the main information desk  You need to re-schedule your appointment should you arrive 10 or more minutes late.  We strive to give you quality time with our providers, and arriving late affects you and other patients whose appointments are after yours.  Also, if you no show three or more times for appointments you may be dismissed from the clinic at the providers discretion.     Again, thank you for choosing [facility].  Our hope is that these requests will decrease the amount of time that you wait before being seen by our physicians.       _____________________________________________________________  Should you have questions after your visit to [facility], please contact our office at [phone] between the hours of 8:00 a.m. and 4:30 p.m.  Voicemails left after 4:00 p.m. will not be returned until the following business day.  For prescription refill requests, have your pharmacy contact our office and allow 72 hours.    Cancer Center Support Programs:   > Cancer Support Group  2nd Tuesday of the month 1pm-2pm, Journey Room

## 2020-07-07 ENCOUNTER — Other Ambulatory Visit (HOSPITAL_COMMUNITY): Payer: Self-pay

## 2020-07-07 DIAGNOSIS — C9000 Multiple myeloma not having achieved remission: Secondary | ICD-10-CM

## 2020-07-07 MED ORDER — POMALIDOMIDE 2 MG PO CAPS: 2.0000 mg | ORAL_CAPSULE | Freq: Every day | ORAL | 0 refills | Status: DC

## 2020-07-07 NOTE — Telephone Encounter (Signed)
Chart reviewed. Pomalyst refilled per Dr. Katragadda 

## 2020-07-11 ENCOUNTER — Ambulatory Visit (HOSPITAL_COMMUNITY): Payer: Medicare Other | Admitting: Hematology

## 2020-07-11 ENCOUNTER — Inpatient Hospital Stay (HOSPITAL_COMMUNITY): Payer: Medicare Other | Attending: Hematology

## 2020-07-11 ENCOUNTER — Other Ambulatory Visit: Payer: Self-pay

## 2020-07-11 ENCOUNTER — Inpatient Hospital Stay (HOSPITAL_COMMUNITY): Payer: Medicare Other

## 2020-07-11 ENCOUNTER — Encounter (HOSPITAL_COMMUNITY): Payer: Self-pay

## 2020-07-11 VITALS — BP 133/53 | HR 66 | Temp 97.5°F | Resp 18

## 2020-07-11 DIAGNOSIS — Z5112 Encounter for antineoplastic immunotherapy: Secondary | ICD-10-CM | POA: Diagnosis not present

## 2020-07-11 DIAGNOSIS — C9000 Multiple myeloma not having achieved remission: Secondary | ICD-10-CM | POA: Diagnosis not present

## 2020-07-11 DIAGNOSIS — Z79899 Other long term (current) drug therapy: Secondary | ICD-10-CM | POA: Diagnosis not present

## 2020-07-11 LAB — CBC WITH DIFFERENTIAL/PLATELET
Abs Immature Granulocytes: 0.03 10*3/uL (ref 0.00–0.07)
Basophils Absolute: 0 10*3/uL (ref 0.0–0.1)
Basophils Relative: 1 %
Eosinophils Absolute: 0.2 10*3/uL (ref 0.0–0.5)
Eosinophils Relative: 5 %
HCT: 29.1 % — ABNORMAL LOW (ref 39.0–52.0)
Hemoglobin: 9.4 g/dL — ABNORMAL LOW (ref 13.0–17.0)
Immature Granulocytes: 1 %
Lymphocytes Relative: 23 %
Lymphs Abs: 0.8 10*3/uL (ref 0.7–4.0)
MCH: 33.3 pg (ref 26.0–34.0)
MCHC: 32.3 g/dL (ref 30.0–36.0)
MCV: 103.2 fL — ABNORMAL HIGH (ref 80.0–100.0)
Monocytes Absolute: 0.5 10*3/uL (ref 0.1–1.0)
Monocytes Relative: 15 %
Neutro Abs: 1.9 10*3/uL (ref 1.7–7.7)
Neutrophils Relative %: 55 %
Platelets: 68 10*3/uL — ABNORMAL LOW (ref 150–400)
RBC: 2.82 MIL/uL — ABNORMAL LOW (ref 4.22–5.81)
RDW: 15.4 % (ref 11.5–15.5)
WBC: 3.4 10*3/uL — ABNORMAL LOW (ref 4.0–10.5)
nRBC: 0 % (ref 0.0–0.2)

## 2020-07-11 LAB — COMPREHENSIVE METABOLIC PANEL
ALT: 25 U/L (ref 0–44)
AST: 14 U/L — ABNORMAL LOW (ref 15–41)
Albumin: 3.6 g/dL (ref 3.5–5.0)
Alkaline Phosphatase: 68 U/L (ref 38–126)
Anion gap: 12 (ref 5–15)
BUN: 30 mg/dL — ABNORMAL HIGH (ref 8–23)
CO2: 25 mmol/L (ref 22–32)
Calcium: 9.1 mg/dL (ref 8.9–10.3)
Chloride: 96 mmol/L — ABNORMAL LOW (ref 98–111)
Creatinine, Ser: 2.43 mg/dL — ABNORMAL HIGH (ref 0.61–1.24)
GFR calc Af Amer: 30 mL/min — ABNORMAL LOW (ref >60)
GFR calc non Af Amer: 26 mL/min — ABNORMAL LOW (ref >60)
Glucose, Bld: 308 mg/dL — ABNORMAL HIGH (ref 70–99)
Potassium: 3.4 mmol/L — ABNORMAL LOW (ref 3.5–5.1)
Sodium: 133 mmol/L — ABNORMAL LOW (ref 135–145)
Total Bilirubin: 0.6 mg/dL (ref 0.3–1.2)
Total Protein: 5.6 g/dL — ABNORMAL LOW (ref 6.5–8.1)

## 2020-07-11 LAB — LACTATE DEHYDROGENASE: LDH: 84 U/L — ABNORMAL LOW (ref 98–192)

## 2020-07-11 MED ORDER — SODIUM CHLORIDE 0.9 % IV SOLN: Freq: Once | INTRAVENOUS | Status: AC

## 2020-07-11 MED ORDER — ACETAMINOPHEN 325 MG PO TABS
ORAL_TABLET | ORAL | Status: AC
  Filled 2020-07-11: qty 2

## 2020-07-11 MED ORDER — DIPHENHYDRAMINE HCL 25 MG PO CAPS
ORAL_CAPSULE | ORAL | Status: AC
  Filled 2020-07-11: qty 2

## 2020-07-11 MED ORDER — DIPHENHYDRAMINE HCL 25 MG PO CAPS
50.0000 mg | ORAL_CAPSULE | Freq: Once | ORAL | Status: AC
  Administered 2020-07-11: 50 mg via ORAL

## 2020-07-11 MED ORDER — HEPARIN SOD (PORK) LOCK FLUSH 100 UNIT/ML IV SOLN
500.0000 [IU] | Freq: Once | INTRAVENOUS | Status: AC | PRN
  Administered 2020-07-11: 500 [IU]

## 2020-07-11 MED ORDER — ACETAMINOPHEN 325 MG PO TABS
650.0000 mg | ORAL_TABLET | Freq: Once | ORAL | Status: AC
  Administered 2020-07-11: 650 mg via ORAL

## 2020-07-11 MED ORDER — SODIUM CHLORIDE 0.9 % IV SOLN
20.0000 mg | Freq: Once | INTRAVENOUS | Status: AC
  Administered 2020-07-11: 20 mg via INTRAVENOUS
  Filled 2020-07-11: qty 20

## 2020-07-11 MED ORDER — SODIUM CHLORIDE 0.9 % IV SOLN
16.0000 mg/kg | Freq: Once | INTRAVENOUS | Status: AC
  Administered 2020-07-11: 1600 mg via INTRAVENOUS
  Filled 2020-07-11: qty 80

## 2020-07-11 MED ORDER — SODIUM CHLORIDE 0.9% FLUSH
10.0000 mL | INTRAVENOUS | Status: DC | PRN
  Administered 2020-07-11: 10 mL

## 2020-07-11 MED ORDER — FAMOTIDINE IN NACL 20-0.9 MG/50ML-% IV SOLN
20.0000 mg | Freq: Once | INTRAVENOUS | Status: AC
  Administered 2020-07-11: 20 mg via INTRAVENOUS

## 2020-07-11 MED ORDER — FAMOTIDINE IN NACL 20-0.9 MG/50ML-% IV SOLN
INTRAVENOUS | Status: AC
  Filled 2020-07-11: qty 50

## 2020-07-11 NOTE — Patient Instructions (Signed)
[facility] Discharge Instructions for Patients Receiving Chemotherapy  Today you received the following chemotherapy agents   To help prevent nausea and vomiting after your treatment, we encourage you to take your nausea medication   If you develop nausea and vomiting that is not controlled by your nausea medication, call the clinic.   BELOW ARE SYMPTOMS THAT SHOULD BE REPORTED IMMEDIATELY:  *FEVER GREATER THAN 100.5 F  *CHILLS WITH OR WITHOUT FEVER  NAUSEA AND VOMITING THAT IS NOT CONTROLLED WITH YOUR NAUSEA MEDICATION  *UNUSUAL SHORTNESS OF BREATH  *UNUSUAL BRUISING OR BLEEDING  TENDERNESS IN MOUTH AND THROAT WITH OR WITHOUT PRESENCE OF ULCERS  *URINARY PROBLEMS  *BOWEL PROBLEMS  UNUSUAL RASH Items with * indicate a potential emergency and should be followed up as soon as possible.  Feel free to call the clinic should you have any questions or concerns. The clinic phone number is [phone].  Please show the CHEMO ALERT CARD at check-in to the Emergency Department and triage nurse.   

## 2020-07-11 NOTE — Progress Notes (Signed)
Pt is here for D1 C4 of daratumumab for his myeloma. Platelets 68, K 3.4, and creatinine 2.43.  Labs reviewed with Dr Raliegh Ip.  Faythe Ghee to proceed with treatment.   Patient is taking pomalyst and has not missed any doses and reports no side effects at this time.   Tolerated treatment well today without incidence.  Discharged via wheelchair.

## 2020-07-11 NOTE — Patient Instructions (Signed)
[facility] at [facility] Discharge Instructions  Labs drawn from portacath today   Thank you for choosing [facility] at [facility] to provide your oncology and hematology care.  To afford each patient quality time with our provider, please arrive at least 15 minutes before your scheduled appointment time.   If you have a lab appointment with the Cancer Center please come in thru the Main Entrance and check in at the main information desk.  You need to re-schedule your appointment should you arrive 10 or more minutes late.  We strive to give you quality time with our providers, and arriving late affects you and other patients whose appointments are after yours.  Also, if you no show three or more times for appointments you may be dismissed from the clinic at the providers discretion.     Again, thank you for choosing [facility].  Our hope is that these requests will decrease the amount of time that you wait before being seen by our physicians.       _____________________________________________________________  Should you have questions after your visit to [facility], please contact our office at [phone] and follow the prompts.  Our office hours are 8:00 a.m. and 4:30 p.m. Monday - Friday.  Please note that voicemails left after 4:00 p.m. may not be returned until the following business day.  We are closed weekends and major holidays.  You do have access to a nurse 24-7, just call the main number to the clinic [phone] and do not press any options, hold on the line and a nurse will answer the phone.    For prescription refill requests, have your pharmacy contact our office and allow 72 hours.    Due to Covid, you will need to wear a mask upon entering the hospital. If you do not have a mask, a mask will be given to you at the Main Entrance upon arrival. For doctor visits, patients may have 1 support person age 18  or older with them. For treatment visits, patients can not have anyone with them due to social distancing guidelines and our immunocompromised population.     

## 2020-07-12 ENCOUNTER — Encounter: Payer: Self-pay | Admitting: Urology

## 2020-07-12 ENCOUNTER — Ambulatory Visit (INDEPENDENT_AMBULATORY_CARE_PROVIDER_SITE_OTHER): Payer: Medicare Other | Admitting: Urology

## 2020-07-12 VITALS — BP 129/75 | HR 85 | Temp 98.3°F

## 2020-07-12 DIAGNOSIS — R39198 Other difficulties with micturition: Secondary | ICD-10-CM | POA: Diagnosis not present

## 2020-07-12 LAB — URINALYSIS, ROUTINE W REFLEX MICROSCOPIC
Bilirubin, UA: NEGATIVE
Ketones, UA: NEGATIVE
Leukocytes,UA: NEGATIVE
Nitrite, UA: NEGATIVE
Specific Gravity, UA: 1.01 (ref 1.005–1.030)
Urobilinogen, Ur: 0.2 mg/dL (ref 0.2–1.0)
pH, UA: 5.5 (ref 5.0–7.5)

## 2020-07-12 LAB — PROTEIN ELECTROPHORESIS, SERUM
A/G Ratio: 1.7 (ref 0.7–1.7)
Albumin ELP: 3.5 g/dL (ref 2.9–4.4)
Alpha-1-Globulin: 0.2 g/dL (ref 0.0–0.4)
Alpha-2-Globulin: 0.8 g/dL (ref 0.4–1.0)
Beta Globulin: 0.9 g/dL (ref 0.7–1.3)
Gamma Globulin: 0.2 g/dL — ABNORMAL LOW (ref 0.4–1.8)
Globulin, Total: 2.1 g/dL — ABNORMAL LOW (ref 2.2–3.9)
M-Spike, %: 0.1 g/dL — ABNORMAL HIGH
Total Protein ELP: 5.6 g/dL — ABNORMAL LOW (ref 6.0–8.5)

## 2020-07-12 LAB — KAPPA/LAMBDA LIGHT CHAINS
Kappa free light chain: 1416.1 mg/L — ABNORMAL HIGH (ref 3.3–19.4)
Kappa, lambda light chain ratio: 168.58 — ABNORMAL HIGH (ref 0.26–1.65)
Lambda free light chains: 8.4 mg/L (ref 5.7–26.3)

## 2020-07-12 LAB — MICROSCOPIC EXAMINATION
Bacteria, UA: NONE SEEN
Epithelial Cells (non renal): NONE SEEN /hpf (ref 0–10)
RBC, Urine: NONE SEEN /hpf (ref 0–2)
Renal Epithel, UA: NONE SEEN /hpf
WBC, UA: NONE SEEN /hpf (ref 0–5)

## 2020-07-12 MED ORDER — ALFUZOSIN HCL ER 10 MG PO TB24: 10.0000 mg | ORAL_TABLET | Freq: Every day | ORAL | 11 refills | Status: AC

## 2020-07-12 NOTE — Progress Notes (Signed)
Urological Symptom Review  Patient is experiencing the following symptoms: Frequent urination Leakage of urine  Weak stream   Review of Systems  Gastrointestinal (upper)  : Negative for upper GI symptoms  Gastrointestinal (lower) : Negative for lower GI symptoms  Constitutional : Fatigue  Skin: Negative for skin symptoms  Eyes: Negative for eye symptoms  Ear/Nose/Throat : Negative for Ear/Nose/Throat symptoms  Hematologic/Lymphatic: Easy bruising  Cardiovascular : Negative for cardiovascular symptoms  Respiratory : Negative for respiratory symptoms  Endocrine: Excessive thirst  Musculoskeletal: Back pain  Neurological: Negative for neurological symptoms  Psychologic: Negative for psychiatric symptoms

## 2020-07-12 NOTE — Progress Notes (Signed)
H&P  Chief Complaint: BPH w/ LUTS  History of Present Illness:  Pt presents today with a weakened FOS (onset 2-3 weeks prior). Pt was started on tamsulosin by his oncologist but he is not currently taking it. He does not think it helped very much. Pt notes that his general urinary symptomatology may predate his 3 week estimation, but that six months ago he was asymptomatic. Pt reports that approximately 2 months ago an MRI revealed a renal mass (cyst). This has been known about for years. 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  Pt denies any recent constipation. Pt was hospitalized with anemia and diagnosed with Multiple Myeloma (came out of remission in June).  IPSS Questionnaire (AUA-7): Over the past month.   1)  How often have you had a sensation of not emptying your bladder completely after you finish urinating?  2 - Less than half the time  2)  How often have you had to urinate again less than two hours after you finished urinating? 4 - More than half the time  3)  How often have you found you stopped and started again several times when you urinated?  0 - Not at all  4) How difficult have you found it to postpone urination?  5 - Almost always  5) How often have you had a weak urinary stream?  5 - Almost always  6) How often have you had to push or strain to begin urination?  0 - Not at all  7) How many times did you most typically get up to urinate from the time you went to bed until the time you got up in the morning?  0 - None  Total score:  0-7 mildly symptomatic   8-19 moderately symptomatic   20-35 severely symptomatic   QOL score: 4    Past Medical History:  Diagnosis Date  . Cancer ([place])    multiple myeloma  . 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 Cervical dystonia   . Diabetes mellitus without complication ([place])   . Gout   . Hypercholesteremia     Past Surgical History:  Procedure Laterality Date  . BONE MARROW BIOPSY    . CHOLECYSTECTOMY  2007  .  COLONOSCOPY WITH PROPOFOL N/A 01/16/2018   Procedure: COLONOSCOPY WITH PROPOFOL;  Surgeon: Daneil Dolin, MD;  [facility];  Service: Endoscopy;  Laterality: N/A;  1:45pm  . ESOPHAGOGASTRODUODENOSCOPY (EGD) WITH PROPOFOL N/A 01/16/2018   Procedure: ESOPHAGOGASTRODUODENOSCOPY (EGD) WITH PROPOFOL;  Surgeon: Daneil Dolin, MD;  Location: [facility];  Service: Endoscopy;  Laterality: N/A;  . GIVENS CAPSULE STUDY N/A 05/12/2018   Procedure: GIVENS CAPSULE STUDY;  Surgeon: Daneil Dolin, MD;  Location: [facility];  Service: Endoscopy;  Laterality: N/A;  7:30am  . NEPHRECTOMY Right 1998   cancer  . PORTACATH PLACEMENT Right 04/07/2020   Procedure: INSERTION PORT-A-CATH (attached catherter in right internal jugular);  Surgeon: Virl Cagey, MD;  Location: [facility];  Service: General;  Laterality: Right;    Home Medications:  Allergies as of 07/12/2020   No Known Allergies     Medication List       Accurate as of July 12, 2020  9:48 AM. If you have any questions, ask your nurse or doctor.        acyclovir 400 MG tablet Commonly known as: ZOVIRAX Take 0.5 tablets (200 mg total) by mouth 2 (two) times daily.   allopurinol 300 MG tablet  Commonly known as: ZYLOPRIM Take 300 mg by mouth every morning.   B-D UF III MINI PEN NEEDLES 31G X 5 MM Misc Generic drug: Insulin Pen Needle   Centrum Silver 50+Men Tabs Take 1 tablet by mouth every morning.   cyclobenzaprine 10 MG tablet Commonly known as: FLEXERIL TAKE 1 TABLET THREE TIMES DAILY AS NEEDED FOR MUSCLE SPASM   DARATUMUMAB IV Inject into the vein. Weeks 1-8 weekly x 8 doses; then every 2 weeks x 8 doses; then every 28 days   dexamethasone 4 MG tablet Commonly known as: DECADRON Take 5 tablets (20 mg total) by mouth once a week.   dicyclomine 10 MG capsule Commonly known as: BENTYL Take 10 mg by mouth 2 (two) times daily as needed.   Eliquis 2.5 MG Tabs tablet Generic drug: apixaban TAKE 1 TABLET  BY MOUTH TWICE DAILY AFTER COMPLETION OF THE STARTER PACK. What changed: See the new instructions.   furosemide 40 MG tablet Commonly known as: LASIX Take 40 mg by mouth every Monday, Wednesday, and Friday.   gabapentin 300 MG capsule Commonly known as: NEURONTIN TAKE 1 CAPSULE BY MOUTH AT BEDTIME   glipiZIDE 10 MG 24 hr tablet Commonly known as: glipiZIDE XL Take 1 tablet (10 mg total) by mouth daily with breakfast.   HYDROcodone-acetaminophen 5-325 MG tablet Commonly known as: NORCO/VICODIN Take 1 tablet by mouth every 4 (four) hours as needed for severe pain.   lidocaine-prilocaine cream Commonly known as: EMLA Apply small amount over port site 1 hour prior to appointment.  Cover with plastic wrap.   NovoLOG FlexPen 100 UNIT/ML FlexPen Generic drug: insulin aspart Inject into the skin.   pomalidomide 2 MG capsule Commonly known as: POMALYST Take 1 capsule (2 mg total) by mouth daily. Take with water on days 1-21. Repeat every 28 days.   pravastatin 20 MG tablet Commonly known as: PRAVACHOL Take 20 mg by mouth at bedtime.   prochlorperazine 10 MG tablet Commonly known as: COMPAZINE Take 1 tablet (10 mg total) by mouth every 6 (six) hours as needed (Nausea or vomiting).   sodium bicarbonate 650 MG tablet Take 1 tablet (650 mg total) by mouth 2 (two) times daily. What changed: how much to take   tamsulosin 0.4 MG Caps capsule Commonly known as: Flomax Take 1 capsule (0.4 mg total) by mouth at bedtime.   traZODone 100 MG tablet Commonly known as: DESYREL Take 1 tablet (100 mg total) by mouth at bedtime.   Tyler Aas FlexTouch 200 UNIT/ML FlexTouch Pen Generic drug: insulin degludec Inject 44 Units into the skin daily.   True Metrix Blood Glucose Test test strip Generic drug: glucose blood       Allergies: No Known Allergies  Family History  Problem Relation Age of Onset  . Heart failure Mother 63  . Dementia Father   . Colon cancer Neg Hx     Social  History:  reports that he has never smoked. He quit smokeless tobacco use about 3 years ago.  His smokeless tobacco use included chew. He reports that he does not drink alcohol and does not use drugs.  ROS: A complete review of systems was performed.  All systems are negative except for pertinent findings as noted.  Physical Exam:  Vital signs in last 24 hours: BP 129/75   Pulse 85   Temp 98.3 F (36.8 C)  Constitutional:  Alert and oriented, No acute distress Respiratory: Normal respiratory effort GI: Abdomen is soft, nontender, nondistended, no abdominal masses. No  CVAT. No hernias. Obese.  Genitourinary: Normal male phallus, testes are descended bilaterally and non-tender and without masses, scrotum is normal in appearance without lesions or masses, perineum is normal on inspection. Prostate feels about 30 grams. Neurologic: Grossly intact, no focal deficits Psychiatric: Normal mood and affect  Laboratory Data:  Recent Labs    07/11/20 0913  WBC 3.4*  HGB 9.4*  HCT 29.1*  PLT 68*    Recent Labs    07/11/20 0913  NA 133*  K 3.4*  CL 96*  GLUCOSE 308*  BUN 30*  CALCIUM 9.1  CREATININE 2.43*    I have reviewed prior pt notes  I have reviewed notes from referring/previous physicians  I have reviewed urinalysis results  I have independently reviewed prior imaging--PET scan from April 2021--prostate appears nml size.  I have reviewed prior PSA results   Impression/Assessment:  Probable BPH w/ LUTS. Prostate exam nml.  Elevated residual urine volume.  Plan:  1. Pt reassured regarding his LUTS and started on alfuzosin.  2. F/U in 3 months for OV and symptom recheck. Pt will call earlier if urinary symptoms persist or worsen.  3. If nt improved on med rx will need cysto  CC: Dr. Asencion Noble

## 2020-07-25 ENCOUNTER — Other Ambulatory Visit (HOSPITAL_COMMUNITY): Payer: Self-pay | Admitting: *Deleted

## 2020-07-25 ENCOUNTER — Inpatient Hospital Stay (HOSPITAL_COMMUNITY): Payer: Medicare Other

## 2020-07-25 ENCOUNTER — Inpatient Hospital Stay (HOSPITAL_BASED_OUTPATIENT_CLINIC_OR_DEPARTMENT_OTHER): Payer: Medicare Other | Admitting: Hematology

## 2020-07-25 ENCOUNTER — Other Ambulatory Visit: Payer: Self-pay

## 2020-07-25 ENCOUNTER — Encounter (HOSPITAL_COMMUNITY): Payer: Self-pay | Admitting: Hematology

## 2020-07-25 VITALS — BP 136/60 | HR 71 | Temp 96.8°F | Resp 18

## 2020-07-25 VITALS — BP 148/57 | HR 96 | Temp 96.8°F | Resp 18 | Wt 232.8 lb

## 2020-07-25 DIAGNOSIS — G629 Polyneuropathy, unspecified: Secondary | ICD-10-CM

## 2020-07-25 DIAGNOSIS — C9000 Multiple myeloma not having achieved remission: Secondary | ICD-10-CM

## 2020-07-25 DIAGNOSIS — Z5112 Encounter for antineoplastic immunotherapy: Secondary | ICD-10-CM | POA: Diagnosis not present

## 2020-07-25 DIAGNOSIS — Z79899 Other long term (current) drug therapy: Secondary | ICD-10-CM | POA: Diagnosis not present

## 2020-07-25 LAB — CBC WITH DIFFERENTIAL/PLATELET
Abs Immature Granulocytes: 0.02 10*3/uL (ref 0.00–0.07)
Basophils Absolute: 0 10*3/uL (ref 0.0–0.1)
Basophils Relative: 1 %
Eosinophils Absolute: 0.2 10*3/uL (ref 0.0–0.5)
Eosinophils Relative: 5 %
HCT: 30 % — ABNORMAL LOW (ref 39.0–52.0)
Hemoglobin: 9.6 g/dL — ABNORMAL LOW (ref 13.0–17.0)
Immature Granulocytes: 1 %
Lymphocytes Relative: 21 %
Lymphs Abs: 0.7 10*3/uL (ref 0.7–4.0)
MCH: 33.4 pg (ref 26.0–34.0)
MCHC: 32 g/dL (ref 30.0–36.0)
MCV: 104.5 fL — ABNORMAL HIGH (ref 80.0–100.0)
Monocytes Absolute: 0.5 10*3/uL (ref 0.1–1.0)
Monocytes Relative: 13 %
Neutro Abs: 2.1 10*3/uL (ref 1.7–7.7)
Neutrophils Relative %: 59 %
Platelets: 87 10*3/uL — ABNORMAL LOW (ref 150–400)
RBC: 2.87 MIL/uL — ABNORMAL LOW (ref 4.22–5.81)
RDW: 15.1 % (ref 11.5–15.5)
WBC: 3.6 10*3/uL — ABNORMAL LOW (ref 4.0–10.5)
nRBC: 0 % (ref 0.0–0.2)

## 2020-07-25 LAB — COMPREHENSIVE METABOLIC PANEL
ALT: 23 U/L (ref 0–44)
AST: 15 U/L (ref 15–41)
Albumin: 3.4 g/dL — ABNORMAL LOW (ref 3.5–5.0)
Alkaline Phosphatase: 62 U/L (ref 38–126)
Anion gap: 12 (ref 5–15)
BUN: 28 mg/dL — ABNORMAL HIGH (ref 8–23)
CO2: 25 mmol/L (ref 22–32)
Calcium: 8.6 mg/dL — ABNORMAL LOW (ref 8.9–10.3)
Chloride: 99 mmol/L (ref 98–111)
Creatinine, Ser: 2.45 mg/dL — ABNORMAL HIGH (ref 0.61–1.24)
GFR calc Af Amer: 30 mL/min — ABNORMAL LOW (ref >60)
GFR calc non Af Amer: 26 mL/min — ABNORMAL LOW (ref >60)
Glucose, Bld: 232 mg/dL — ABNORMAL HIGH (ref 70–99)
Potassium: 3.5 mmol/L (ref 3.5–5.1)
Sodium: 136 mmol/L (ref 135–145)
Total Bilirubin: 0.8 mg/dL (ref 0.3–1.2)
Total Protein: 5.7 g/dL — ABNORMAL LOW (ref 6.5–8.1)

## 2020-07-25 MED ORDER — GABAPENTIN 300 MG PO CAPS: 600.0000 mg | ORAL_CAPSULE | Freq: Every day | ORAL | 3 refills | Status: DC

## 2020-07-25 MED ORDER — SODIUM CHLORIDE 0.9 % IV SOLN
16.0000 mg/kg | Freq: Once | INTRAVENOUS | Status: AC
  Administered 2020-07-25: 1600 mg via INTRAVENOUS
  Filled 2020-07-25: qty 80

## 2020-07-25 MED ORDER — SODIUM CHLORIDE 0.9 % IV SOLN: Freq: Once | INTRAVENOUS | Status: AC

## 2020-07-25 MED ORDER — ACETAMINOPHEN 325 MG PO TABS
650.0000 mg | ORAL_TABLET | Freq: Once | ORAL | Status: AC
  Administered 2020-07-25: 650 mg via ORAL
  Filled 2020-07-25: qty 2

## 2020-07-25 MED ORDER — SODIUM CHLORIDE 0.9 % IV SOLN
20.0000 mg | Freq: Once | INTRAVENOUS | Status: AC
  Administered 2020-07-25: 20 mg via INTRAVENOUS
  Filled 2020-07-25: qty 20

## 2020-07-25 MED ORDER — HYDROCODONE-ACETAMINOPHEN 5-325 MG PO TABS
1.0000 | ORAL_TABLET | Freq: Two times a day (BID) | ORAL | 0 refills | Status: DC | PRN
Start: 2020-07-25 — End: 2020-11-23

## 2020-07-25 MED ORDER — DIPHENHYDRAMINE HCL 25 MG PO CAPS
50.0000 mg | ORAL_CAPSULE | Freq: Once | ORAL | Status: AC
  Administered 2020-07-25: 50 mg via ORAL
  Filled 2020-07-25: qty 2

## 2020-07-25 MED ORDER — FAMOTIDINE IN NACL 20-0.9 MG/50ML-% IV SOLN
20.0000 mg | Freq: Once | INTRAVENOUS | Status: AC
  Administered 2020-07-25: 20 mg via INTRAVENOUS
  Filled 2020-07-25: qty 50

## 2020-07-25 MED ORDER — SODIUM CHLORIDE 0.9% FLUSH
10.0000 mL | INTRAVENOUS | Status: DC | PRN
  Administered 2020-07-25: 10 mL

## 2020-07-25 MED ORDER — HEPARIN SOD (PORK) LOCK FLUSH 100 UNIT/ML IV SOLN
500.0000 [IU] | Freq: Once | INTRAVENOUS | Status: AC | PRN
  Administered 2020-07-25: 500 [IU]

## 2020-07-25 NOTE — Patient Instructions (Signed)
[facility] Discharge Instructions for Patients Receiving Chemotherapy  Today you received the following chemotherapy agents   To help prevent nausea and vomiting after your treatment, we encourage you to take your nausea medication   If you develop nausea and vomiting that is not controlled by your nausea medication, call the clinic.   BELOW ARE SYMPTOMS THAT SHOULD BE REPORTED IMMEDIATELY:  *FEVER GREATER THAN 100.5 F  *CHILLS WITH OR WITHOUT FEVER  NAUSEA AND VOMITING THAT IS NOT CONTROLLED WITH YOUR NAUSEA MEDICATION  *UNUSUAL SHORTNESS OF BREATH  *UNUSUAL BRUISING OR BLEEDING  TENDERNESS IN MOUTH AND THROAT WITH OR WITHOUT PRESENCE OF ULCERS  *URINARY PROBLEMS  *BOWEL PROBLEMS  UNUSUAL RASH Items with * indicate a potential emergency and should be followed up as soon as possible.  Feel free to call the clinic should you have any questions or concerns. The clinic phone number is [phone].  Please show the CHEMO ALERT CARD at check-in to the Emergency Department and triage nurse.   

## 2020-07-25 NOTE — Progress Notes (Signed)
Patient was assessed by Dr. Delton Coombes and labs have been reviewed.  WBC 3.6, ANC 2.1, platelets 87, BUN 28, Creatinine 2.45, patient is okay to proceed with treatment today. Primary RN and pharmacy aware.

## 2020-07-25 NOTE — Progress Notes (Signed)
Patient presents today for treatment and follow up visit with Dr. Delton Coombes. BUN 28, Creatinine 2.45, Platelets 87. Hgb 9.6. Vital signs within parameters for treatment. Glucose 232 today.   Message received from [place] RN/ Dr. Delton Coombes proceed with treatment. Labs reviewed by MD. Platelets 87 and MD aware.   Treatment given today per MD orders. Tolerated infusion without adverse affects. Vital signs stable. No complaints at this time. Discharged from clinic via wheel chair. Alert and oriented x 3. F/U with [facility] as scheduled.

## 2020-07-25 NOTE — Patient Instructions (Signed)
[facility] at [facility] Discharge Instructions  Labs drawn from portacath today   Thank you for choosing [facility] at [facility] to provide your oncology and hematology care.  To afford each patient quality time with our provider, please arrive at least 15 minutes before your scheduled appointment time.   If you have a lab appointment with the Cancer Center please come in thru the Main Entrance and check in at the main information desk.  You need to re-schedule your appointment should you arrive 10 or more minutes late.  We strive to give you quality time with our providers, and arriving late affects you and other patients whose appointments are after yours.  Also, if you no show three or more times for appointments you may be dismissed from the clinic at the providers discretion.     Again, thank you for choosing [facility].  Our hope is that these requests will decrease the amount of time that you wait before being seen by our physicians.       _____________________________________________________________  Should you have questions after your visit to [facility], please contact our office at [phone] and follow the prompts.  Our office hours are 8:00 a.m. and 4:30 p.m. Monday - Friday.  Please note that voicemails left after 4:00 p.m. may not be returned until the following business day.  We are closed weekends and major holidays.  You do have access to a nurse 24-7, just call the main number to the clinic [phone] and do not press any options, hold on the line and a nurse will answer the phone.    For prescription refill requests, have your pharmacy contact our office and allow 72 hours.    Due to Covid, you will need to wear a mask upon entering the hospital. If you do not have a mask, a mask will be given to you at the Main Entrance upon arrival. For doctor visits, patients may have 1 support person age 18  or older with them. For treatment visits, patients can not have anyone with them due to social distancing guidelines and our immunocompromised population.     

## 2020-07-25 NOTE — Patient Instructions (Signed)
[facility] at [facility] Discharge Instructions  You were seen today by Dr. Katragadda. He went over your recent results. You received your treatment today; continue receiving your treatment every 2 weeks. Dr. Katragadda will see you back in 4 weeks for labs and follow up.   Thank you for choosing [facility] at [facility] to provide your oncology and hematology care.  To afford each patient quality time with our provider, please arrive at least 15 minutes before your scheduled appointment time.   If you have a lab appointment with the Cancer Center please come in thru the Main Entrance and check in at the main information desk  You need to re-schedule your appointment should you arrive 10 or more minutes late.  We strive to give you quality time with our providers, and arriving late affects you and other patients whose appointments are after yours.  Also, if you no show three or more times for appointments you may be dismissed from the clinic at the providers discretion.     Again, thank you for choosing [facility].  Our hope is that these requests will decrease the amount of time that you wait before being seen by our physicians.       _____________________________________________________________  Should you have questions after your visit to  Cancer Center, please contact our office at [phone] between the hours of 8:00 a.m. and 4:30 p.m.  Voicemails left after 4:00 p.m. will not be returned until the following business day.  For prescription refill requests, have your pharmacy contact our office and allow 72 hours.    Cancer Center Support Programs:   > Cancer Support Group  2nd Tuesday of the month 1pm-2pm, Journey Room    

## 2020-07-25 NOTE — Progress Notes (Signed)
[place], [place] [address]   CLINIC:  Medical Oncology/Hematology  PCP:  Asencion Noble, MD [address] / [place] [address] [phone]   REASON FOR VISIT:  Follow-up for multiple myeloma  PRIOR THERAPY: Revlimid x 4 cycles from 09/05/2017 to 02/18/2018 with Revlimid maintenance  NGS Results: Not done  CURRENT THERAPY: Darzalex every 2 weeks  BRIEF ONCOLOGIC HISTORY:  Oncology History  Multiple myeloma ([place])  08/29/2017 Initial Diagnosis   Multiple myeloma ([place])   12/10/2017 - 03/31/2018 Chemotherapy   The patient had bortezomib SQ (VELCADE) chemo injection 2.75 mg, 1.3 mg/m2 = 2.75 mg, Subcutaneous,  Once, 5 of 5 cycles Administration: 2.75 mg (12/10/2017), 2.75 mg (12/17/2017), 2.75 mg (12/31/2017), 2.75 mg (01/21/2018), 2.75 mg (02/18/2018), 2.75 mg (03/11/2018)  for chemotherapy treatment.    04/11/2020 -  Chemotherapy   The patient had daratumumab (DARZALEX) 800 mg in sodium chloride 0.9 % 960 mL (0.8 mg/mL) chemo infusion, 8 mg/kg = 800 mg (100 % of original dose 8 mg/kg), Intravenous, Once, 1 of 1 cycle Dose modification: 8 mg/kg (original dose 8 mg/kg, Cycle 1, Reason: Provider Judgment, Comment: giving total dose 38m/kg over 2 days) daratumumab (DARZALEX) 800 mg in sodium chloride 0.9 % 460 mL (1.6 mg/mL) chemo infusion, 8 mg/kg = 800 mg (50 % of original dose 16 mg/kg), Intravenous, Once, 2 of 2 cycles Dose modification: 8 mg/kg (original dose 16 mg/kg, Cycle 1, Reason: Provider Judgment, Comment: giving 16 mg/kg over 2 days. thus 8 mg/kg in 2 divided doses.) Administration: 1,600 mg (04/19/2020), 1,600 mg (05/03/2020), 1,600 mg (05/09/2020), 800 mg (04/11/2020), 800 mg (04/12/2020) daratumumab (DARZALEX) 1,600 mg in sodium chloride 0.9 % 420 mL chemo infusion, 16 mg/kg = 1,600 mg, Intravenous, Once, 3 of 6 cycles Administration: 1,600 mg (05/16/2020), 1,600 mg (05/23/2020), 1,600 mg (05/30/2020), 1,600 mg (06/06/2020), 1,600 mg  (06/13/2020), 1,600 mg (06/27/2020), 1,600 mg (07/11/2020)  for chemotherapy treatment.      CANCER STAGING: Cancer Staging No matching staging information was found for the patient.  INTERVAL HISTORY:  Mr. WJAELYNN CURRIER a 69y.o. male, returns for routine follow-up and consideration for next cycle of chemotherapy. WGabrielewas last seen on 06/27/2020.  Due for day #15 of cycle #4 of daratumumab today.   Overall, he tells me he has been feeling okay. He reports having pain below the right scapula in the ribs, similar to the pain he had before the radiation; the pain is worse when he bends forward but does not bother him when he sits or reclines. He also complains having constant numbness and tingling in his hands which he reports is slightly worsening, but takes gabapentin QHS for it. He is tolerating the Pomalyst well.  Overall, he feels ready for next cycle of chemo today.    REVIEW OF SYSTEMS:  Review of Systems  Constitutional: Positive for appetite change (moderately decreased) and fatigue.  Cardiovascular: Positive for leg swelling.  Musculoskeletal: Positive for back pain (7/10 right middle back pain).  Neurological: Positive for numbness (in feet & tinling in hands).  All other systems reviewed and are negative.   PAST MEDICAL/SURGICAL HISTORY:  Past Medical History:  Diagnosis Date  . Cancer ([facility]    multiple myeloma  . Cancer of right kidney ([place]   . Cervical dystonia   . Diabetes mellitus without complication ([place]   . Gout   . Hypercholesteremia    Past Surgical History:  Procedure Laterality Date  . BONE  MARROW BIOPSY    . CHOLECYSTECTOMY  2007  . COLONOSCOPY WITH PROPOFOL N/A 01/16/2018   Procedure: COLONOSCOPY WITH PROPOFOL;  Surgeon: Daneil Dolin, MD;  Location: [facility];  Service: Endoscopy;  Laterality: N/A;  1:45pm  . ESOPHAGOGASTRODUODENOSCOPY (EGD) WITH PROPOFOL N/A 01/16/2018   Procedure: ESOPHAGOGASTRODUODENOSCOPY (EGD) WITH PROPOFOL;   Surgeon: Daneil Dolin, MD;  Location: [facility];  Service: Endoscopy;  Laterality: N/A;  . GIVENS CAPSULE STUDY N/A 05/12/2018   Procedure: GIVENS CAPSULE STUDY;  Surgeon: Daneil Dolin, MD;  Location: [facility];  Service: Endoscopy;  Laterality: N/A;  7:30am  . NEPHRECTOMY Right 1998   cancer  . PORTACATH PLACEMENT Right 04/07/2020   Procedure: INSERTION PORT-A-CATH (attached catherter in right internal jugular);  Surgeon: Virl Cagey, MD;  Location: [facility];  Service: General;  Laterality: Right;    SOCIAL HISTORY:  Social History   Socioeconomic History  . Marital status: Single    Spouse name: Not on file  . Number of children: Not on file  . Years of education: Not on file  . Highest education level: Not on file  Occupational History  . Occupation: Marine scientist, Insurance underwriter  Tobacco Use  . Smoking status: Never Smoker  . Smokeless tobacco: Former Systems developer    Types: Secondary school teacher  . Vaping Use: Never used  Substance and Sexual Activity  . Alcohol use: No  . Drug use: No  . Sexual activity: Not Currently  Other Topics Concern  . Not on file  Social History Narrative  . Not on file   Social Determinants of Health   Financial Resource Strain:   . Difficulty of Paying Living Expenses: Not on file  Food Insecurity:   . Worried About Charity fundraiser in the Last Year: Not on file  . Ran Out of Food in the Last Year: Not on file  Transportation Needs:   . Lack of Transportation (Medical): Not on file  . Lack of Transportation (Non-Medical): Not on file  Physical Activity:   . Days of Exercise per Week: Not on file  . Minutes of Exercise per Session: Not on file  Stress:   . Feeling of Stress : Not on file  Social Connections:   . Frequency of Communication with Friends and Family: Not on file  . Frequency of Social Gatherings with Friends and Family: Not on file  . Attends Religious Services: Not on file  . Active Member of Clubs or  Organizations: Not on file  . Attends Archivist Meetings: Not on file  . Marital Status: Not on file  Intimate Partner Violence:   . Fear of Current or Ex-Partner: Not on file  . Emotionally Abused: Not on file  . Physically Abused: Not on file  . Sexually Abused: Not on file    FAMILY HISTORY:  Family History  Problem Relation Age of Onset  . Heart failure Mother 25  . Dementia Father   . Colon cancer Neg Hx     CURRENT MEDICATIONS:  Current Outpatient Medications  Medication Sig Dispense Refill  . acyclovir (ZOVIRAX) 400 MG tablet Take 0.5 tablets (200 mg total) by mouth 2 (two) times daily. 60 tablet 4  . alfuzosin (UROXATRAL) 10 MG 24 hr tablet Take 1 tablet (10 mg total) by mouth daily with breakfast. 30 tablet 11  . allopurinol (ZYLOPRIM) 300 MG tablet Take 300 mg by mouth every morning.    . B-D UF III MINI  PEN NEEDLES 31G X 5 MM MISC   4  . cyclobenzaprine (FLEXERIL) 10 MG tablet TAKE 1 TABLET THREE TIMES DAILY AS NEEDED FOR MUSCLE SPASM 30 tablet 0  . DARATUMUMAB IV Inject into the vein. Weeks 1-8 weekly x 8 doses; then every 2 weeks x 8 doses; then every 28 days    . dexamethasone (DECADRON) 4 MG tablet Take 5 tablets (20 mg total) by mouth once a week. 20 tablet 3  . dicyclomine (BENTYL) 10 MG capsule Take 10 mg by mouth 2 (two) times daily as needed.     [name] ELIQUIS 2.5 MG TABS tablet TAKE 1 TABLET BY MOUTH TWICE DAILY AFTER COMPLETION OF THE STARTER PACK. (Patient taking differently: Take 2.5 mg by mouth daily. ) 60 tablet 11  . furosemide (LASIX) 40 MG tablet Take 40 mg by mouth every Monday, Wednesday, and Friday.     . gabapentin (NEURONTIN) 300 MG capsule Take 2 capsules (600 mg total) by mouth at bedtime. 60 capsule 3  . glipiZIDE (GLIPIZIDE XL) 10 MG 24 hr tablet Take 1 tablet (10 mg total) by mouth daily with breakfast. 30 tablet 2  . HYDROcodone-acetaminophen (NORCO/VICODIN) 5-325 MG tablet Take 1 tablet by mouth every 4 (four) hours as needed for severe  pain. 5 tablet 0  . lidocaine-prilocaine (EMLA) cream Apply small amount over port site 1 hour prior to appointment.  Cover with plastic wrap. 30 g 3  . Multiple Vitamins-Minerals (CENTRUM SILVER 50+MEN) TABS Take 1 tablet by mouth every morning.    [name] NOVOLOG FLEXPEN 100 UNIT/ML FlexPen Inject into the skin.    . pomalidomide (POMALYST) 2 MG capsule Take 1 capsule (2 mg total) by mouth daily. Take with water on days 1-21. Repeat every 28 days. 21 capsule 0  . pravastatin (PRAVACHOL) 20 MG tablet Take 20 mg by mouth at bedtime.     . prochlorperazine (COMPAZINE) 10 MG tablet Take 1 tablet (10 mg total) by mouth every 6 (six) hours as needed (Nausea or vomiting). 30 tablet 1  . sodium bicarbonate 650 MG tablet Take 1 tablet (650 mg total) by mouth 2 (two) times daily. (Patient taking differently: Take 1,300 mg by mouth 2 (two) times daily. ) 60 tablet 1  . traZODone (DESYREL) 100 MG tablet Take 1 tablet (100 mg total) by mouth at bedtime. 30 tablet 3  . TRESIBA FLEXTOUCH 200 UNIT/ML SOPN Inject 44 Units into the skin daily.     . TRUE METRIX BLOOD GLUCOSE TEST test strip      No current facility-administered medications for this visit.   Facility-Administered Medications Ordered in Other Visits  Medication Dose Route Frequency Provider Last Rate Last Admin  . 0.9 %  sodium chloride infusion  250 mL Intravenous Once Twana First, MD        ALLERGIES:  No Known Allergies  PHYSICAL EXAM:  Performance status (ECOG): 1 - Symptomatic but completely ambulatory  Vitals:   07/25/20 0854  BP: (!) 148/57  Pulse: 96  Resp: 18  Temp: (!) 96.8 F (36 C)  SpO2: 100%   Wt Readings from Last 3 Encounters:  07/25/20 232 lb 12.8 oz (105.6 kg)  07/11/20 229 lb 11.2 oz (104.2 kg)  06/27/20 212 lb (96.2 kg)   Physical Exam Vitals reviewed.  Constitutional:      Appearance: Normal appearance. He is obese.  Cardiovascular:     Rate and Rhythm: Normal rate and regular rhythm.     Pulses: Normal  pulses.  Heart sounds: Normal heart sounds.  Pulmonary:     Effort: Pulmonary effort is normal.     Breath sounds: Normal breath sounds.  Chest:     Comments: Port-a-Cath in R chest Musculoskeletal:     Thoracic back: Bony tenderness (Right ribs tender below scapula) present.     Right lower leg: Edema (1+) present.     Left lower leg: Edema (1+) present.  Neurological:     General: No focal deficit present.     Mental Status: He is alert and oriented to person, place, and time.  Psychiatric:        Mood and Affect: Mood normal.        Behavior: Behavior normal.     LABORATORY DATA:  I have reviewed the labs as listed.  CBC Latest Ref Rng & Units 07/11/2020 06/27/2020 06/13/2020  WBC 4.0 - 10.5 K/uL 3.4(L) 5.4 7.7  Hemoglobin 13.0 - 17.0 g/dL 9.4(L) 8.8(L) 9.8(L)  Hematocrit 39 - 52 % 29.1(L) 27.7(L) 29.4(L)  Platelets 150 - 400 K/uL 68(L) 70(L) 94(L)   CMP Latest Ref Rng & Units 07/25/2020 07/11/2020 06/27/2020  Glucose 70 - 99 mg/dL 232(H) 308(H) 229(H)  BUN 8 - 23 mg/dL 28(H) 30(H) 29(H)  Creatinine 0.61 - 1.24 mg/dL 2.45(H) 2.43(H) 2.62(H)  Sodium 135 - 145 mmol/L 136 133(L) 135  Potassium 3.5 - 5.1 mmol/L 3.5 3.4(L) 3.7  Chloride 98 - 111 mmol/L 99 96(L) 100  CO2 22 - 32 mmol/L '25 25 25  ' Calcium 8.9 - 10.3 mg/dL 8.6(L) 9.1 8.9  Total Protein 6.5 - 8.1 g/dL 5.7(L) 5.6(L) 5.8(L)  Total Bilirubin 0.3 - 1.2 mg/dL 0.8 0.6 0.5  Alkaline Phos 38 - 126 U/L 62 68 82  AST 15 - 41 U/L 15 14(L) 13(L)  ALT 0 - 44 U/L '23 25 21    ' DIAGNOSTIC IMAGING:  I have independently reviewed the scans and discussed with the patient. No results found.   ASSESSMENT:  1. IgG kappa multiple myeloma: -4 cycles of RVD from 08/30/2017 through 02/18/2018. -Declined bone marrow transplant. Received maintenance Revlimid 2.5 mg 3 weeks on 1 week off. -Bone marrow biopsy on 02/23/2020 shows 21% plasma cells. Occasional sections had up to 75% myeloma cells. -Chromosome analysis was normal. FISH  panel was positive for gain of 1 q., monosomy 13, del 17 P, t(14;20) -PET scan showed new left frontal destructive lesion. Numerous areas of lytic changes in the spine. Lucent areas in T6 with loss of height at T5. Numerous lytic changes throughout the spine noted along with pedicle and lamina and transverse process of T8 vertebral body. Profound hypermetabolic activity within the ribs bilaterally. -Daratumumab was started on 04/11/2020.  Pomalidomide 2 mg 3 weeks on 1 week off started on 06/20/2020.  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 -Completed XRT on 04/05/2020.  3. 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 He is on Eliquis.   PLAN:  1. IgG kappa multiple myeloma: -He is tolerating daratumumab very well. -He is also tolerating pomalidomide reasonably well. -He developed pain in the right posterior lower ribs which is new.  He had similar pain prior to radiation. -Hence have recommended MRI of the thoracic spine.  He will have it done tomorrow evening. -Reviewed myeloma labs from 07/11/2020.  M spike is stable at 0.1 g. -Free light chain ratio is 168.58.  Kappa light chains have increased to 1416.  However free light chain ratio has improved.  We will closely monitor. -Repeat myeloma labs in 2 weeks and RTC in 4 weeks.  2. CKD: -Creatinine is 2.45 and slightly improved.  3. Left leg DVT: -Continue Eliquis 2.5 mg daily.  No bleeding issues.  4.Diabetes: -Continue Tresiba and NovoLog.  5. Insomnia: -Continue trazodone 100 mg at bedtime.  6. Lower extremity swelling: -Continue Lasix 40 mg daily.  7. Urinary hesitancy: -Continue Flomax 0.4 mg daily.  8.  Neuropathy: -Neuropathy is slightly more prominent in the  fingertips. -Continue gabapentin 600 mg at bedtime.  9.  Right posterior chest wall pain: -We will give him prescription for hydrocodone 5 mg twice daily as needed.   Orders placed this encounter:  No orders of the defined types were placed in this encounter.    Derek Jack, MD [facility] [phone]   I, Milinda Antis, am acting as a scribe for Dr. Sanda Linger.  I, Derek Jack MD, have reviewed the above documentation for accuracy and completeness, and I agree with the above.

## 2020-07-26 ENCOUNTER — Ambulatory Visit (HOSPITAL_COMMUNITY)
Admission: RE | Admit: 2020-07-26 | Discharge: 2020-07-26 | Disposition: A | Discharge status: Home / Self Care | Payer: Medicare Other | Source: Ambulatory Visit | Attending: Hematology | Admitting: Hematology

## 2020-07-26 DIAGNOSIS — D492 Neoplasm of unspecified behavior of bone, soft tissue, and skin: Secondary | ICD-10-CM | POA: Diagnosis not present

## 2020-07-26 DIAGNOSIS — C9000 Multiple myeloma not having achieved remission: Secondary | ICD-10-CM | POA: Insufficient documentation

## 2020-07-26 DIAGNOSIS — M47814 Spondylosis without myelopathy or radiculopathy, thoracic region: Secondary | ICD-10-CM | POA: Diagnosis not present

## 2020-07-26 DIAGNOSIS — M4804 Spinal stenosis, thoracic region: Secondary | ICD-10-CM | POA: Diagnosis not present

## 2020-07-27 ENCOUNTER — Encounter (HOSPITAL_COMMUNITY): Payer: Self-pay | Admitting: *Deleted

## 2020-07-27 NOTE — Progress Notes (Signed)
Per Dr. Delton Coombes, I spoke with patient via telephone and advised of his recent MRI results.  He verbalized understanding and was pleased with the good news.  Patient advised to follow up as previously scheduled.

## 2020-08-03 ENCOUNTER — Other Ambulatory Visit (HOSPITAL_COMMUNITY): Payer: Self-pay

## 2020-08-03 DIAGNOSIS — C9000 Multiple myeloma not having achieved remission: Secondary | ICD-10-CM

## 2020-08-03 MED ORDER — POMALIDOMIDE 2 MG PO CAPS: 2.0000 mg | ORAL_CAPSULE | Freq: Every day | ORAL | 0 refills | Status: DC

## 2020-08-03 NOTE — Telephone Encounter (Signed)
Chart reviewed. Pomalyst refilled per Dr. Katragadda 

## 2020-08-08 ENCOUNTER — Other Ambulatory Visit: Payer: Self-pay

## 2020-08-08 ENCOUNTER — Inpatient Hospital Stay (HOSPITAL_COMMUNITY): Payer: Medicare Other

## 2020-08-08 ENCOUNTER — Inpatient Hospital Stay (HOSPITAL_COMMUNITY): Payer: Medicare Other | Attending: Hematology

## 2020-08-08 VITALS — BP 138/61 | HR 72 | Temp 98.6°F | Resp 16 | Wt 238.3 lb

## 2020-08-08 DIAGNOSIS — Z923 Personal history of irradiation: Secondary | ICD-10-CM | POA: Diagnosis not present

## 2020-08-08 DIAGNOSIS — Z794 Long term (current) use of insulin: Secondary | ICD-10-CM | POA: Insufficient documentation

## 2020-08-08 DIAGNOSIS — Z79899 Other long term (current) drug therapy: Secondary | ICD-10-CM | POA: Diagnosis not present

## 2020-08-08 DIAGNOSIS — C9000 Multiple myeloma not having achieved remission: Secondary | ICD-10-CM | POA: Diagnosis not present

## 2020-08-08 DIAGNOSIS — Z5112 Encounter for antineoplastic immunotherapy: Secondary | ICD-10-CM | POA: Insufficient documentation

## 2020-08-08 DIAGNOSIS — Z86718 Personal history of other venous thrombosis and embolism: Secondary | ICD-10-CM | POA: Diagnosis not present

## 2020-08-08 DIAGNOSIS — Z7901 Long term (current) use of anticoagulants: Secondary | ICD-10-CM | POA: Diagnosis not present

## 2020-08-08 DIAGNOSIS — M4804 Spinal stenosis, thoracic region: Secondary | ICD-10-CM | POA: Diagnosis not present

## 2020-08-08 DIAGNOSIS — N189 Chronic kidney disease, unspecified: Secondary | ICD-10-CM | POA: Diagnosis not present

## 2020-08-08 DIAGNOSIS — Z23 Encounter for immunization: Secondary | ICD-10-CM | POA: Insufficient documentation

## 2020-08-08 DIAGNOSIS — M47814 Spondylosis without myelopathy or radiculopathy, thoracic region: Secondary | ICD-10-CM | POA: Insufficient documentation

## 2020-08-08 DIAGNOSIS — E1122 Type 2 diabetes mellitus with diabetic chronic kidney disease: Secondary | ICD-10-CM | POA: Insufficient documentation

## 2020-08-08 LAB — COMPREHENSIVE METABOLIC PANEL
ALT: 23 U/L (ref 0–44)
AST: 13 U/L — ABNORMAL LOW (ref 15–41)
Albumin: 3.3 g/dL — ABNORMAL LOW (ref 3.5–5.0)
Alkaline Phosphatase: 60 U/L (ref 38–126)
Anion gap: 10 (ref 5–15)
BUN: 43 mg/dL — ABNORMAL HIGH (ref 8–23)
CO2: 24 mmol/L (ref 22–32)
Calcium: 8.6 mg/dL — ABNORMAL LOW (ref 8.9–10.3)
Chloride: 101 mmol/L (ref 98–111)
Creatinine, Ser: 2.74 mg/dL — ABNORMAL HIGH (ref 0.61–1.24)
GFR, Estimated: 22 mL/min — ABNORMAL LOW (ref >60)
Glucose, Bld: 212 mg/dL — ABNORMAL HIGH (ref 70–99)
Potassium: 4 mmol/L (ref 3.5–5.1)
Sodium: 135 mmol/L (ref 135–145)
Total Bilirubin: 0.6 mg/dL (ref 0.3–1.2)
Total Protein: 5.6 g/dL — ABNORMAL LOW (ref 6.5–8.1)

## 2020-08-08 LAB — CBC WITH DIFFERENTIAL/PLATELET
Abs Immature Granulocytes: 0.18 10*3/uL — ABNORMAL HIGH (ref 0.00–0.07)
Basophils Absolute: 0 10*3/uL (ref 0.0–0.1)
Basophils Relative: 1 %
Eosinophils Absolute: 0.2 10*3/uL (ref 0.0–0.5)
Eosinophils Relative: 4 %
HCT: 27.4 % — ABNORMAL LOW (ref 39.0–52.0)
Hemoglobin: 8.7 g/dL — ABNORMAL LOW (ref 13.0–17.0)
Immature Granulocytes: 4 %
Lymphocytes Relative: 17 %
Lymphs Abs: 0.8 10*3/uL (ref 0.7–4.0)
MCH: 33.6 pg (ref 26.0–34.0)
MCHC: 31.8 g/dL (ref 30.0–36.0)
MCV: 105.8 fL — ABNORMAL HIGH (ref 80.0–100.0)
Monocytes Absolute: 0.3 10*3/uL (ref 0.1–1.0)
Monocytes Relative: 6 %
Neutro Abs: 3.1 10*3/uL (ref 1.7–7.7)
Neutrophils Relative %: 68 %
Platelets: 128 10*3/uL — ABNORMAL LOW (ref 150–400)
RBC: 2.59 MIL/uL — ABNORMAL LOW (ref 4.22–5.81)
RDW: 15.6 % — ABNORMAL HIGH (ref 11.5–15.5)
WBC: 4.5 10*3/uL (ref 4.0–10.5)
nRBC: 0 % (ref 0.0–0.2)

## 2020-08-08 LAB — LACTATE DEHYDROGENASE: LDH: 99 U/L (ref 98–192)

## 2020-08-08 MED ORDER — SODIUM CHLORIDE 0.9% FLUSH
10.0000 mL | INTRAVENOUS | Status: DC | PRN
  Administered 2020-08-08: 10 mL

## 2020-08-08 MED ORDER — FAMOTIDINE IN NACL 20-0.9 MG/50ML-% IV SOLN
20.0000 mg | Freq: Once | INTRAVENOUS | Status: AC
  Administered 2020-08-08: 20 mg via INTRAVENOUS
  Filled 2020-08-08: qty 50

## 2020-08-08 MED ORDER — SODIUM CHLORIDE 0.9 % IV SOLN
16.0000 mg/kg | Freq: Once | INTRAVENOUS | Status: AC
  Administered 2020-08-08: 1600 mg via INTRAVENOUS
  Filled 2020-08-08: qty 80

## 2020-08-08 MED ORDER — ACETAMINOPHEN 325 MG PO TABS
650.0000 mg | ORAL_TABLET | Freq: Once | ORAL | Status: AC
  Administered 2020-08-08: 650 mg via ORAL
  Filled 2020-08-08: qty 2

## 2020-08-08 MED ORDER — INFLUENZA VAC A&B SA ADJ QUAD 0.5 ML IM PRSY
0.5000 mL | PREFILLED_SYRINGE | Freq: Once | INTRAMUSCULAR | Status: AC
  Administered 2020-08-08: 0.5 mL via INTRAMUSCULAR
  Filled 2020-08-08: qty 0.5

## 2020-08-08 MED ORDER — DIPHENHYDRAMINE HCL 25 MG PO CAPS
50.0000 mg | ORAL_CAPSULE | Freq: Once | ORAL | Status: AC
  Administered 2020-08-08: 50 mg via ORAL
  Filled 2020-08-08: qty 2

## 2020-08-08 MED ORDER — SODIUM CHLORIDE 0.9 % IV SOLN: Freq: Once | INTRAVENOUS | Status: AC

## 2020-08-08 MED ORDER — OCTREOTIDE ACETATE 30 MG IM KIT
PACK | INTRAMUSCULAR | Status: AC
  Filled 2020-08-08: qty 1

## 2020-08-08 MED ORDER — HEPARIN SOD (PORK) LOCK FLUSH 100 UNIT/ML IV SOLN
500.0000 [IU] | Freq: Once | INTRAVENOUS | Status: AC | PRN
  Administered 2020-08-08: 500 [IU]

## 2020-08-08 MED ORDER — SODIUM CHLORIDE 0.9 % IV SOLN
20.0000 mg | Freq: Once | INTRAVENOUS | Status: AC
  Administered 2020-08-08: 20 mg via INTRAVENOUS
  Filled 2020-08-08: qty 20

## 2020-08-08 NOTE — Progress Notes (Signed)
Kidney function elevated today. MD aware. Labs reviewed and will proceed with treatment per MD.   Treatment given per orders. Patient tolerated it well without problems. Vitals stable and discharged home from clinic via wheelchair in stable condition. Follow up as scheduled.

## 2020-08-08 NOTE — Patient Instructions (Signed)
[facility] at [facility] Discharge Instructions  Labs drawn from portacath today   Thank you for choosing [facility] at [facility] to provide your oncology and hematology care.  To afford each patient quality time with our provider, please arrive at least 15 minutes before your scheduled appointment time.   If you have a lab appointment with the Cancer Center please come in thru the Main Entrance and check in at the main information desk.  You need to re-schedule your appointment should you arrive 10 or more minutes late.  We strive to give you quality time with our providers, and arriving late affects you and other patients whose appointments are after yours.  Also, if you no show three or more times for appointments you may be dismissed from the clinic at the providers discretion.     Again, thank you for choosing [facility].  Our hope is that these requests will decrease the amount of time that you wait before being seen by our physicians.       _____________________________________________________________  Should you have questions after your visit to [facility], please contact our office at [phone] and follow the prompts.  Our office hours are 8:00 a.m. and 4:30 p.m. Monday - Friday.  Please note that voicemails left after 4:00 p.m. may not be returned until the following business day.  We are closed weekends and major holidays.  You do have access to a nurse 24-7, just call the main number to the clinic [phone] and do not press any options, hold on the line and a nurse will answer the phone.    For prescription refill requests, have your pharmacy contact our office and allow 72 hours.    Due to Covid, you will need to wear a mask upon entering the hospital. If you do not have a mask, a mask will be given to you at the Main Entrance upon arrival. For doctor visits, patients may have 1 support person age 18  or older with them. For treatment visits, patients can not have anyone with them due to social distancing guidelines and our immunocompromised population.     

## 2020-08-09 LAB — PROTEIN ELECTROPHORESIS, SERUM
A/G Ratio: 1.6 (ref 0.7–1.7)
Albumin ELP: 3.1 g/dL (ref 2.9–4.4)
Alpha-1-Globulin: 0.2 g/dL (ref 0.0–0.4)
Alpha-2-Globulin: 0.7 g/dL (ref 0.4–1.0)
Beta Globulin: 0.8 g/dL (ref 0.7–1.3)
Gamma Globulin: 0.2 g/dL — ABNORMAL LOW (ref 0.4–1.8)
Globulin, Total: 1.9 g/dL — ABNORMAL LOW (ref 2.2–3.9)
M-Spike, %: 0.1 g/dL — ABNORMAL HIGH
Total Protein ELP: 5 g/dL — ABNORMAL LOW (ref 6.0–8.5)

## 2020-08-09 LAB — KAPPA/LAMBDA LIGHT CHAINS
Kappa free light chain: 1501.5 mg/L — ABNORMAL HIGH (ref 3.3–19.4)
Kappa, lambda light chain ratio: 268.13 — ABNORMAL HIGH (ref 0.26–1.65)
Lambda free light chains: 5.6 mg/L — ABNORMAL LOW (ref 5.7–26.3)

## 2020-08-22 ENCOUNTER — Inpatient Hospital Stay (HOSPITAL_BASED_OUTPATIENT_CLINIC_OR_DEPARTMENT_OTHER): Payer: Medicare Other | Admitting: Hematology

## 2020-08-22 ENCOUNTER — Inpatient Hospital Stay (HOSPITAL_COMMUNITY): Payer: Medicare Other

## 2020-08-22 ENCOUNTER — Other Ambulatory Visit: Payer: Self-pay

## 2020-08-22 ENCOUNTER — Encounter (HOSPITAL_COMMUNITY): Payer: Self-pay | Admitting: Hematology

## 2020-08-22 VITALS — BP 136/73 | HR 95 | Temp 96.8°F | Resp 20 | Wt 235.8 lb

## 2020-08-22 VITALS — BP 116/55 | HR 75 | Temp 97.5°F | Resp 18

## 2020-08-22 DIAGNOSIS — Z7901 Long term (current) use of anticoagulants: Secondary | ICD-10-CM | POA: Diagnosis not present

## 2020-08-22 DIAGNOSIS — C9000 Multiple myeloma not having achieved remission: Secondary | ICD-10-CM | POA: Diagnosis not present

## 2020-08-22 DIAGNOSIS — Z23 Encounter for immunization: Secondary | ICD-10-CM | POA: Diagnosis not present

## 2020-08-22 DIAGNOSIS — Z86718 Personal history of other venous thrombosis and embolism: Secondary | ICD-10-CM | POA: Diagnosis not present

## 2020-08-22 DIAGNOSIS — Z5112 Encounter for antineoplastic immunotherapy: Secondary | ICD-10-CM | POA: Diagnosis not present

## 2020-08-22 DIAGNOSIS — Z923 Personal history of irradiation: Secondary | ICD-10-CM | POA: Diagnosis not present

## 2020-08-22 LAB — CBC WITH DIFFERENTIAL/PLATELET
Abs Immature Granulocytes: 0.03 10*3/uL (ref 0.00–0.07)
Basophils Absolute: 0 10*3/uL (ref 0.0–0.1)
Basophils Relative: 0 %
Eosinophils Absolute: 0.2 10*3/uL (ref 0.0–0.5)
Eosinophils Relative: 7 %
HCT: 26.6 % — ABNORMAL LOW (ref 39.0–52.0)
Hemoglobin: 8.6 g/dL — ABNORMAL LOW (ref 13.0–17.0)
Immature Granulocytes: 1 %
Lymphocytes Relative: 27 %
Lymphs Abs: 0.8 10*3/uL (ref 0.7–4.0)
MCH: 32.7 pg (ref 26.0–34.0)
MCHC: 32.3 g/dL (ref 30.0–36.0)
MCV: 101.1 fL — ABNORMAL HIGH (ref 80.0–100.0)
Monocytes Absolute: 0.4 10*3/uL (ref 0.1–1.0)
Monocytes Relative: 14 %
Neutro Abs: 1.4 10*3/uL — ABNORMAL LOW (ref 1.7–7.7)
Neutrophils Relative %: 51 %
Platelets: 74 10*3/uL — ABNORMAL LOW (ref 150–400)
RBC: 2.63 MIL/uL — ABNORMAL LOW (ref 4.22–5.81)
RDW: 15 % (ref 11.5–15.5)
WBC: 2.8 10*3/uL — ABNORMAL LOW (ref 4.0–10.5)
nRBC: 0 % (ref 0.0–0.2)

## 2020-08-22 LAB — COMPREHENSIVE METABOLIC PANEL
ALT: 25 U/L (ref 0–44)
AST: 12 U/L — ABNORMAL LOW (ref 15–41)
Albumin: 3.5 g/dL (ref 3.5–5.0)
Alkaline Phosphatase: 63 U/L (ref 38–126)
Anion gap: 11 (ref 5–15)
BUN: 41 mg/dL — ABNORMAL HIGH (ref 8–23)
CO2: 24 mmol/L (ref 22–32)
Calcium: 9.1 mg/dL (ref 8.9–10.3)
Chloride: 101 mmol/L (ref 98–111)
Creatinine, Ser: 2.55 mg/dL — ABNORMAL HIGH (ref 0.61–1.24)
GFR, Estimated: 26 mL/min — ABNORMAL LOW (ref >60)
Glucose, Bld: 140 mg/dL — ABNORMAL HIGH (ref 70–99)
Potassium: 4.1 mmol/L (ref 3.5–5.1)
Sodium: 136 mmol/L (ref 135–145)
Total Bilirubin: 0.6 mg/dL (ref 0.3–1.2)
Total Protein: 5.8 g/dL — ABNORMAL LOW (ref 6.5–8.1)

## 2020-08-22 MED ORDER — SODIUM CHLORIDE 0.9% FLUSH
10.0000 mL | INTRAVENOUS | Status: DC | PRN
  Administered 2020-08-22: 10 mL

## 2020-08-22 MED ORDER — FAMOTIDINE IN NACL 20-0.9 MG/50ML-% IV SOLN
20.0000 mg | Freq: Once | INTRAVENOUS | Status: AC
  Administered 2020-08-22: 20 mg via INTRAVENOUS

## 2020-08-22 MED ORDER — SODIUM CHLORIDE 0.9 % IV SOLN
20.0000 mg | Freq: Once | INTRAVENOUS | Status: AC
  Administered 2020-08-22: 20 mg via INTRAVENOUS
  Filled 2020-08-22: qty 2

## 2020-08-22 MED ORDER — SODIUM CHLORIDE 0.9 % IV SOLN: Freq: Once | INTRAVENOUS | Status: AC

## 2020-08-22 MED ORDER — FAMOTIDINE IN NACL 20-0.9 MG/50ML-% IV SOLN
INTRAVENOUS | Status: AC
  Filled 2020-08-22: qty 50

## 2020-08-22 MED ORDER — HEPARIN SOD (PORK) LOCK FLUSH 100 UNIT/ML IV SOLN
500.0000 [IU] | Freq: Once | INTRAVENOUS | Status: AC | PRN
  Administered 2020-08-22: 500 [IU]

## 2020-08-22 MED ORDER — SODIUM CHLORIDE 0.9 % IV SOLN
16.0000 mg/kg | Freq: Once | INTRAVENOUS | Status: AC
  Administered 2020-08-22: 1600 mg via INTRAVENOUS
  Filled 2020-08-22: qty 80

## 2020-08-22 MED ORDER — DIPHENHYDRAMINE HCL 25 MG PO CAPS
50.0000 mg | ORAL_CAPSULE | Freq: Once | ORAL | Status: AC
  Administered 2020-08-22: 50 mg via ORAL

## 2020-08-22 MED ORDER — ACETAMINOPHEN 325 MG PO TABS
650.0000 mg | ORAL_TABLET | Freq: Once | ORAL | Status: AC
  Administered 2020-08-22: 650 mg via ORAL

## 2020-08-22 MED ORDER — ACETAMINOPHEN 325 MG PO TABS
ORAL_TABLET | ORAL | Status: AC
  Filled 2020-08-22: qty 2

## 2020-08-22 MED ORDER — DIPHENHYDRAMINE HCL 25 MG PO CAPS
ORAL_CAPSULE | ORAL | Status: AC
  Filled 2020-08-22: qty 2

## 2020-08-22 NOTE — Progress Notes (Signed)
Tom Marshall, [place] [address]   CLINIC:  Medical Oncology/Hematology  PCP:  Asencion Noble, MD [address] / [place] [address] [phone]   REASON FOR VISIT:  Follow-up for multiple myeloma  PRIOR THERAPY: Revlimid x 4 cycles from 09/05/2017 to 02/18/2018 with Revlimid maintenance  NGS Results: Not done  CURRENT THERAPY: Daratumumab every 2 weeks; Pomalyst 3 weeks on/1 week off  BRIEF ONCOLOGIC HISTORY:  Oncology History  Multiple myeloma ([place])  08/29/2017 Initial Diagnosis   Multiple myeloma ([place])   12/10/2017 - 03/11/2018 Chemotherapy   The patient had dexamethasone (DECADRON) 4 MG tablet, 1 of 1 cycle, Start date: --, End date: -- lenalidomide (REVLIMID) 25 MG capsule, 1 of 1 cycle, Start date: --, End date: -- bortezomib SQ (VELCADE) chemo injection 2.75 mg, 1.3 mg/m2 = 2.75 mg, Subcutaneous,  Once, 5 of 5 cycles Administration: 2.75 mg (12/10/2017), 2.75 mg (12/17/2017), 2.75 mg (12/31/2017), 2.75 mg (01/21/2018), 2.75 mg (02/18/2018), 2.75 mg (03/11/2018)  for chemotherapy treatment.    04/11/2020 -  Chemotherapy   The patient had daratumumab (DARZALEX) 800 mg in sodium chloride 0.9 % 960 mL (0.8 mg/mL) chemo infusion, 8 mg/kg = 800 mg (100 % of original dose 8 mg/kg), Intravenous, Once, 1 of 1 cycle Dose modification: 8 mg/kg (original dose 8 mg/kg, Cycle 1, Reason: Provider Judgment, Comment: giving total dose 43m/kg over 2 days) daratumumab (DARZALEX) 800 mg in sodium chloride 0.9 % 460 mL (1.6 mg/mL) chemo infusion, 8 mg/kg = 800 mg (50 % of original dose 16 mg/kg), Intravenous, Once, 2 of 2 cycles Dose modification: 8 mg/kg (original dose 16 mg/kg, Cycle 1, Reason: Provider Judgment, Comment: giving 16 mg/kg over 2 days. thus 8 mg/kg in 2 divided doses.) Administration: 1,600 mg (04/19/2020), 1,600 mg (05/03/2020), 1,600 mg (05/09/2020), 800 mg (04/11/2020), 800 mg (04/12/2020) daratumumab (DARZALEX) 1,600 mg in sodium chloride 0.9  % 420 mL chemo infusion, 16 mg/kg = 1,600 mg, Intravenous, Once, 4 of 6 cycles Administration: 1,600 mg (05/16/2020), 1,600 mg (05/23/2020), 1,600 mg (05/30/2020), 1,600 mg (06/06/2020), 1,600 mg (06/13/2020), 1,600 mg (06/27/2020), 1,600 mg (07/11/2020), 1,600 mg (07/25/2020), 1,600 mg (08/08/2020)  for chemotherapy treatment.      CANCER STAGING: Cancer Staging No matching staging information was found for the patient.  INTERVAL HISTORY:  [name] a 69y.o. male, returns for routine follow-up and consideration for next cycle of chemotherapy. [place] last seen on 07/25/2020.  Due for day #15 of cycle #5 of daratumumab today.   Overall, he tells me he has been feeling pretty well. He denies having any falls, infections or F/C. He reports having some jumping in his legs and tremors in his left arm for the past few days. He reports having minor chest wall pain occasionally. He continues taking Pomalyst 3 weeks on/1 week off tolerating it well without diarrhea and Decadron weekly. His appetite is good.  Overall, he feels ready for next cycle of chemo today.    REVIEW OF SYSTEMS:  Review of Systems  Constitutional: Positive for fatigue (50%). Negative for appetite change, chills and fever.  Respiratory: Positive for shortness of breath (w/ exertion).   Gastrointestinal: Negative for diarrhea.  Neurological: Positive for numbness (& tingling in hands & feet).  All other systems reviewed and are negative.   PAST MEDICAL/SURGICAL HISTORY:  Past Medical History:  Diagnosis Date  . Cancer ([place]    multiple myeloma  . Cancer of right kidney ([place]   .  Cervical dystonia   . Diabetes mellitus without complication ([place])   . Gout   . Hypercholesteremia    Past Surgical History:  Procedure Laterality Date  . BONE MARROW BIOPSY    . CHOLECYSTECTOMY  2007  . COLONOSCOPY WITH PROPOFOL N/A 01/16/2018   Procedure: COLONOSCOPY WITH PROPOFOL;  Surgeon: Daneil Dolin, MD;  Location: [facility]  END[facility];  Service: Endoscopy;  Laterality: N/A;  1:45pm  . ESOPHAGOGASTRODUODENOSCOPY (EGD) WITH PROPOFOL N/A 01/16/2018   Procedure: ESOPHAGOGASTRODUODENOSCOPY (EGD) WITH PROPOFOL;  Surgeon: Daneil Dolin, MD;  Location: [facility];  Service: Endoscopy;  Laterality: N/A;  . GIVENS CAPSULE STUDY N/A 05/12/2018   Procedure: GIVENS CAPSULE STUDY;  Surgeon: Daneil Dolin, MD;  Location: [facility] SU[facility];  Service: Endoscopy;  Laterality: N/A;  7:30am  . NEPHRECTOMY Right 1998   cancer  . PORTACATH PLACEMENT Right 04/07/2020   Procedure: INSERTION PORT-A-CATH (attached catherter in right internal jugular);  Surgeon: Virl Cagey, MD;  Location: [facility] ORS;  Service: General;  Laterality: Right;    SOCIAL HISTORY:  Social History   Socioeconomic History  . Marital status: Single    Spouse name: Not on file  . Number of children: Not on file  . Years of education: Not on file  . Highest education level: Not on file  Occupational History  . Occupation: Marine scientist, Insurance underwriter  Tobacco Use  . Smoking status: Never Smoker  . Smokeless tobacco: Former Systems developer    Types: Secondary school teacher  . Vaping Use: Never used  Substance and Sexual Activity  . Alcohol use: No  . Drug use: No  . Sexual activity: Not Currently  Other Topics Concern  . Not on file  Social History Narrative  . Not on file   Social Determinants of Health   Financial Resource Strain:   . Difficulty of Paying Living Expenses: Not on file  Food Insecurity:   . Worried About Charity fundraiser in the Last Year: Not on file  . Ran Out of Food in the Last Year: Not on file  Transportation Needs:   . Lack of Transportation (Medical): Not on file  . Lack of Transportation (Non-Medical): Not on file  Physical Activity:   . Days of Exercise per Week: Not on file  . Minutes of Exercise per Session: Not on file  Stress:   . Feeling of Stress : Not on file  Social Connections:   . Frequency of Communication with  Friends and Family: Not on file  . Frequency of Social Gatherings with Friends and Family: Not on file  . Attends Religious Services: Not on file  . Active Member of Clubs or Organizations: Not on file  . Attends Archivist Meetings: Not on file  . Marital Status: Not on file  Intimate Partner Violence:   . Fear of Current or Ex-Partner: Not on file  . Emotionally Abused: Not on file  . Physically Abused: Not on file  . Sexually Abused: Not on file    FAMILY HISTORY:  Family History  Problem Relation Age of Onset  . Heart failure Mother 82  . Dementia Father   . Colon cancer Neg Hx     CURRENT MEDICATIONS:  Current Outpatient Medications  Medication Sig Dispense Refill  . acyclovir (ZOVIRAX) 400 MG tablet Take 0.5 tablets (200 mg total) by mouth 2 (two) times daily. 60 tablet 4  . alfuzosin (UROXATRAL) 10 MG 24 hr tablet Take 1 tablet (10 mg  total) by mouth daily with breakfast. 30 tablet 11  . allopurinol (ZYLOPRIM) 300 MG tablet Take 300 mg by mouth every morning.    . B-D UF III MINI PEN NEEDLES 31G X 5 MM MISC   4  . cyclobenzaprine (FLEXERIL) 10 MG tablet TAKE 1 TABLET THREE TIMES DAILY AS NEEDED FOR MUSCLE SPASM 30 tablet 0  . DARATUMUMAB IV Inject into the vein. Weeks 1-8 weekly x 8 doses; then every 2 weeks x 8 doses; then every 28 days    . dexamethasone (DECADRON) 4 MG tablet Take 5 tablets (20 mg total) by mouth once a week. 20 tablet 3  . dicyclomine (BENTYL) 10 MG capsule Take 10 mg by mouth 2 (two) times daily as needed.     . diphenoxylate-atropine (LOMOTIL) 2.5-0.025 MG tablet Take by mouth.    . ELIQUIS 2.5 MG TABS tablet TAKE 1 TABLET BY MOUTH TWICE DAILY AFTER COMPLETION OF THE STARTER PACK. (Patient taking differently: Take 2.5 mg by mouth daily. ) 60 tablet 11  . furosemide (LASIX) 40 MG tablet Take 40 mg by mouth every Monday, Wednesday, and Friday.     . gabapentin (NEURONTIN) 300 MG capsule Take 2 capsules (600 mg total) by mouth at bedtime. 60  capsule 3  . glipiZIDE (GLIPIZIDE XL) 10 MG 24 hr tablet Take 1 tablet (10 mg total) by mouth daily with breakfast. 30 tablet 2  . HYDROcodone-acetaminophen (NORCO/VICODIN) 5-325 MG tablet Take 1 tablet by mouth 2 (two) times daily as needed for severe pain. 60 tablet 0  . lidocaine-prilocaine (EMLA) cream Apply small amount over port site 1 hour prior to appointment.  Cover with plastic wrap. 30 g 3  . Multiple Vitamins-Minerals (CENTRUM SILVER 50+MEN) TABS Take 1 tablet by mouth every morning.    [name] NOVOLOG FLEXPEN 100 UNIT/ML FlexPen Inject into the skin.    . pomalidomide (POMALYST) 2 MG capsule Take 1 capsule (2 mg total) by mouth daily. Take with water on days 1-21. Repeat every 28 days. 21 capsule 0  . pravastatin (PRAVACHOL) 20 MG tablet Take 20 mg by mouth at bedtime.     . prochlorperazine (COMPAZINE) 10 MG tablet Take 1 tablet (10 mg total) by mouth every 6 (six) hours as needed (Nausea or vomiting). 30 tablet 1  . sodium bicarbonate 650 MG tablet Take 1 tablet (650 mg total) by mouth 2 (two) times daily. (Patient taking differently: Take 1,300 mg by mouth 2 (two) times daily. ) 60 tablet 1  . traZODone (DESYREL) 100 MG tablet Take 1 tablet (100 mg total) by mouth at bedtime. 30 tablet 3  . TRESIBA FLEXTOUCH 200 UNIT/ML SOPN Inject 44 Units into the skin daily.     . TRUE METRIX BLOOD GLUCOSE TEST test strip      No current facility-administered medications for this visit.   Facility-Administered Medications Ordered in Other Visits  Medication Dose Route Frequency Provider Last Rate Last Admin  . 0.9 %  sodium chloride infusion  250 mL Intravenous Once Twana First, MD        ALLERGIES:  No Known Allergies  PHYSICAL EXAM:  Performance status (ECOG): 1 - Symptomatic but completely ambulatory  Vitals:   08/22/20 0841  BP: 136/73  Pulse: 95  Resp: 20  Temp: (!) 96.8 F (36 C)  SpO2: 96%   Wt Readings from Last 3 Encounters:  08/22/20 235 lb 12.8 oz (107 kg)  08/08/20 238  lb 5.1 oz (108.1 kg)  07/25/20 232 lb 12.8 oz (  105.6 kg)   Physical Exam Vitals reviewed.  Constitutional:      Appearance: Normal appearance. He is obese.  Cardiovascular:     Rate and Rhythm: Normal rate and regular rhythm.     Pulses: Normal pulses.     Heart sounds: Normal heart sounds.  Pulmonary:     Effort: Pulmonary effort is normal.     Breath sounds: Normal breath sounds.  Chest:     Chest wall: No tenderness.     Comments: Port-a-Cath in R chest Musculoskeletal:     Right lower leg: Edema (1+) present.     Left lower leg: Edema (1+) present.  Neurological:     General: No focal deficit present.     Mental Status: He is alert and oriented to person, place, and time.  Psychiatric:        Mood and Affect: Mood normal.        Behavior: Behavior normal.     LABORATORY DATA:  I have reviewed the labs as listed.  CBC Latest Ref Rng & Units 08/22/2020 08/08/2020 07/25/2020  WBC 4.0 - 10.5 K/uL 2.8(L) 4.5 3.6(L)  Hemoglobin 13.0 - 17.0 g/dL 8.6(L) 8.7(L) 9.6(L)  Hematocrit 39 - 52 % 26.6(L) 27.4(L) 30.0(L)  Platelets 150 - 400 K/uL 74(L) 128(L) 87(L)   CMP Latest Ref Rng & Units 08/22/2020 08/08/2020 07/25/2020  Glucose 70 - 99 mg/dL 140(H) 212(H) 232(H)  BUN 8 - 23 mg/dL 41(H) 43(H) 28(H)  Creatinine 0.61 - 1.24 mg/dL 2.55(H) 2.74(H) 2.45(H)  Sodium 135 - 145 mmol/L 136 135 136  Potassium 3.5 - 5.1 mmol/L 4.1 4.0 3.5  Chloride 98 - 111 mmol/L 101 101 99  CO2 22 - 32 mmol/L '24 24 25  ' Calcium 8.9 - 10.3 mg/dL 9.1 8.6(L) 8.6(L)  Total Protein 6.5 - 8.1 g/dL 5.8(L) 5.6(L) 5.7(L)  Total Bilirubin 0.3 - 1.2 mg/dL 0.6 0.6 0.8  Alkaline Phos 38 - 126 U/L 63 60 62  AST 15 - 41 U/L 12(L) 13(L) 15  ALT 0 - 44 U/L '25 23 23    ' DIAGNOSTIC IMAGING:  I have independently reviewed the scans and discussed with the patient. MR Thoracic Spine Wo Contrast  Result Date: 07/27/2020 CLINICAL DATA:  Multiple myeloma EXAM: MRI THORACIC SPINE WITHOUT CONTRAST TECHNIQUE: Multiplanar,  multisequence MR imaging of the thoracic spine was performed. No intravenous contrast was administered. COMPARISON:  MRI thoracic spine 03/17/2020 FINDINGS: Alignment:  Normal sagittal alignment.  Focal levoscoliosis at T5. Vertebrae: Interval improvement in diffuse bone marrow involvement of multiple myeloma seen on the prior study. Previously, the bone marrow is diffusely low signal on T1. There remain numerous lesions within the bone marrow however the background bone marrow has largely improved with fatty marrow now present in most of the vertebral bodies. Moderate compression fracture of T4 is unchanged.  No new fracture Tumor involvement of the T5 vertebral body on the right has improved. Near complete resolution of ventral epidural tumor extension at this level. Mild cord flattening on the right with resolution of spinal stenosis which was present previously due to epidural tumor. Scattered small bone marrow lesions are present in the vertebral bodies and posterior elements at multiple levels, with interval improvement. No other areas of epidural tumor. Tumor involving the proximal right 7 rib shows interval improvement. Cord:  Negative for cord compression or cord signal abnormality. Paraspinal and other soft tissues: No new areas of soft tissue mass identified Disc levels: Multilevel cervical degenerative change with spondylosis. Multilevel thoracic degenerative  changes without significant spinal stenosis. IMPRESSION: Significant overall of improvement in multiple myeloma. Much of the bone marrow has returned to normal fatty marrow however there remain widely scattered small bone marrow lesions compatible with residual/treated myeloma. Ventral epidural tumor on the right at T5 has nearly completely resolved with resolution of spinal stenosis. Proximal right T7 rib with surrounding soft tissue mass has resolved. Electronically Signed   By: Franchot Gallo M.D.   On: 07/27/2020 11:20     ASSESSMENT:  1.  IgG kappa multiple myeloma: -4 cycles of RVD from 08/30/2017 through 02/18/2018. -Declined bone marrow transplant. Received maintenance Revlimid 2.5 mg 3 weeks on 1 week off. -Bone marrow biopsy on 02/23/2020 shows 21% plasma cells. Occasional sections had up to 75% myeloma cells. -Chromosome analysis was normal. FISH panel was positive for gain of 1 q., monosomy 13, del 17 P, t(14;20) -PET scan showed new left frontal destructive lesion. Numerous areas of lytic changes in the spine. Lucent areas in T6 with loss of height at T5. Numerous lytic changes throughout the spine noted along with pedicle and lamina and transverse process of T8 vertebral body. Profound hypermetabolic activity within the ribs bilaterally. -Daratumumab was started on 04/11/2020.  Pomalidomide 2 mg 3 weeks on 1 week off started on 06/20/2020.  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 -Completed XRT on 04/05/2020. -MRI thoracic spine on 07/26/2020 showed significant overall improvement in multiple myeloma with much of the bone marrow has returned to normal fatty marrow.  Ventral epidural tumor on the right of T5 near completely resolved.  Proximal right T7 rib lesion also resolved.  3. 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 He is on Eliquis.   PLAN:  1. IgG kappa multiple myeloma: -We reviewed results of MRI from 07/18/2020 which showed very good response. -Myeloma labs from 08/08/2020 shows kappa light chains increased to 1501 and ratio of 268.  However M spike is stable at 0.1. -His creatinine is 2.55 with normal calcium.  Would recommend continuing daratumumab every [redacted] weeks along with pomalidomide 2 mg 3 weeks on 1 week off. -Reevaluate in 5 weeks with repeat myeloma labs.  2.  CKD: -Creatinine is staying between 2.4 and 2.7 and stable.  3. Left leg DVT: -Continue Eliquis 2.5 mg daily with no bleeding issues.  4.Diabetes: -Continue Tresiba NovoLog.  5. Insomnia: -Continue trazodone 100 mg at bedtime.  6. Lower extremity swelling: -Continue Lasix 40 mg daily as needed.  7. Urinary hesitancy: -Continue Flomax 0.4 mg daily.  8.  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  9.  Right posterior chest wall pain: -This has resolved.  Use hydrocodone 5 mg as needed.   Orders placed this encounter:  No orders of the defined types were placed in this encounter.    Derek Jack, MD [place] [phone]   I, Milinda Antis, am acting as a scribe for Dr. Sanda Linger.  I, Derek Jack MD, have reviewed the above documentation for accuracy and completeness, and I agree with the above.

## 2020-08-22 NOTE — Patient Instructions (Signed)
[facility] at [facility] Discharge Instructions  Labs drawn from portacath today   Thank you for choosing [facility] at [facility] to provide your oncology and hematology care.  To afford each patient quality time with our provider, please arrive at least 15 minutes before your scheduled appointment time.   If you have a lab appointment with the Cancer Center please come in thru the Main Entrance and check in at the main information desk.  You need to re-schedule your appointment should you arrive 10 or more minutes late.  We strive to give you quality time with our providers, and arriving late affects you and other patients whose appointments are after yours.  Also, if you no show three or more times for appointments you may be dismissed from the clinic at the providers discretion.     Again, thank you for choosing [facility].  Our hope is that these requests will decrease the amount of time that you wait before being seen by our physicians.       _____________________________________________________________  Should you have questions after your visit to [facility], please contact our office at [phone] and follow the prompts.  Our office hours are 8:00 a.m. and 4:30 p.m. Monday - Friday.  Please note that voicemails left after 4:00 p.m. may not be returned until the following business day.  We are closed weekends and major holidays.  You do have access to a nurse 24-7, just call the main number to the clinic [phone] and do not press any options, hold on the line and a nurse will answer the phone.    For prescription refill requests, have your pharmacy contact our office and allow 72 hours.    Due to Covid, you will need to wear a mask upon entering the hospital. If you do not have a mask, a mask will be given to you at the Main Entrance upon arrival. For doctor visits, patients may have 1 support person age 18  or older with them. For treatment visits, patients can not have anyone with them due to social distancing guidelines and our immunocompromised population.     

## 2020-08-22 NOTE — Progress Notes (Signed)
Patient was assessed by Dr. Delton Coombes and labs have been reviewed.  Patient has been taking his pomalyst.  He has not missed any doses and not having any side effects.  Patient is okay to proceed with treatment today. Primary RN and pharmacy aware.

## 2020-08-22 NOTE — Patient Instructions (Signed)

## 2020-08-22 NOTE — Progress Notes (Signed)
Patient presents today for treatment and follow up with Dr. Delton Coombes. Labs reviewed by MD. ANC 1.4, Platelets 74, Creatinine 2.55 today. MD aware.   Message received from [place] RN/ Dr. Delton Coombes to proceed with treatment at this time.   Treatment given today per MD orders. Tolerated infusion without adverse affects. Vital signs stable. No complaints at this time. Discharged from clinic via wheel chair in stable condition. Alert and oriented x 3. F/U with [facility] as scheduled.

## 2020-08-22 NOTE — Patient Instructions (Signed)
[place] at [facility] Discharge Instructions  You were seen today by Dr. Delton Coombes. He went over your recent results. You received your treatment today; continue receiving your treatment every 2 weeks. Dr. Delton Coombes will see you back in 5 weeks for labs and follow up.   Thank you for choosing [place] at [facility] to provide your oncology and hematology care.  To afford each patient quality time with our provider, please arrive at least 15 minutes before your scheduled appointment time.   If you have a lab appointment with the [facility] please come in thru the Main Entrance and check in at the main information desk  You need to re-schedule your appointment should you arrive 10 or more minutes late.  We strive to give you quality time with our providers, and arriving late affects you and other patients whose appointments are after yours.  Also, if you no show three or more times for appointments you may be dismissed from the clinic at the providers discretion.     Again, thank you for choosing [facility].  Our hope is that these requests will decrease the amount of time that you wait before being seen by our physicians.       _____________________________________________________________  Should you have questions after your visit to [facility], please contact our office at [phone] between the hours of 8:00 a.m. and 4:30 p.m.  Voicemails left after 4:00 p.m. will not be returned until the following business day.  For prescription refill requests, have your pharmacy contact our office and allow 72 hours.    Cancer Center Support Programs:   > Cancer Support Group  2nd Tuesday of the month 1pm-2pm, Journey Room

## 2020-08-29 ENCOUNTER — Other Ambulatory Visit (HOSPITAL_COMMUNITY): Payer: Self-pay

## 2020-08-29 DIAGNOSIS — C9000 Multiple myeloma not having achieved remission: Secondary | ICD-10-CM

## 2020-08-29 MED ORDER — POMALIDOMIDE 2 MG PO CAPS: 2.0000 mg | ORAL_CAPSULE | Freq: Every day | ORAL | 0 refills | Status: DC

## 2020-08-29 NOTE — Telephone Encounter (Signed)
Chart reviewed. Pomalyst refilled per Dr. Katragadda 

## 2020-08-31 DIAGNOSIS — Z23 Encounter for immunization: Secondary | ICD-10-CM | POA: Diagnosis not present

## 2020-09-05 ENCOUNTER — Inpatient Hospital Stay (HOSPITAL_COMMUNITY): Payer: Medicare Other | Attending: Hematology

## 2020-09-05 ENCOUNTER — Inpatient Hospital Stay (HOSPITAL_COMMUNITY): Payer: Medicare Other

## 2020-09-05 ENCOUNTER — Other Ambulatory Visit: Payer: Self-pay

## 2020-09-05 VITALS — BP 134/57 | HR 69 | Temp 98.4°F | Resp 18

## 2020-09-05 DIAGNOSIS — Z9221 Personal history of antineoplastic chemotherapy: Secondary | ICD-10-CM | POA: Diagnosis not present

## 2020-09-05 DIAGNOSIS — Z923 Personal history of irradiation: Secondary | ICD-10-CM | POA: Diagnosis not present

## 2020-09-05 DIAGNOSIS — Z7901 Long term (current) use of anticoagulants: Secondary | ICD-10-CM | POA: Diagnosis not present

## 2020-09-05 DIAGNOSIS — G4700 Insomnia, unspecified: Secondary | ICD-10-CM | POA: Insufficient documentation

## 2020-09-05 DIAGNOSIS — Z79899 Other long term (current) drug therapy: Secondary | ICD-10-CM | POA: Insufficient documentation

## 2020-09-05 DIAGNOSIS — N189 Chronic kidney disease, unspecified: Secondary | ICD-10-CM | POA: Diagnosis not present

## 2020-09-05 DIAGNOSIS — E1140 Type 2 diabetes mellitus with diabetic neuropathy, unspecified: Secondary | ICD-10-CM | POA: Insufficient documentation

## 2020-09-05 DIAGNOSIS — C9000 Multiple myeloma not having achieved remission: Secondary | ICD-10-CM | POA: Insufficient documentation

## 2020-09-05 DIAGNOSIS — Z86718 Personal history of other venous thrombosis and embolism: Secondary | ICD-10-CM | POA: Diagnosis not present

## 2020-09-05 DIAGNOSIS — E1122 Type 2 diabetes mellitus with diabetic chronic kidney disease: Secondary | ICD-10-CM | POA: Diagnosis not present

## 2020-09-05 DIAGNOSIS — Z5112 Encounter for antineoplastic immunotherapy: Secondary | ICD-10-CM | POA: Insufficient documentation

## 2020-09-05 LAB — COMPREHENSIVE METABOLIC PANEL
ALT: 26 U/L (ref 0–44)
AST: 15 U/L (ref 15–41)
Albumin: 3.6 g/dL (ref 3.5–5.0)
Alkaline Phosphatase: 66 U/L (ref 38–126)
Anion gap: 10 (ref 5–15)
BUN: 36 mg/dL — ABNORMAL HIGH (ref 8–23)
CO2: 25 mmol/L (ref 22–32)
Calcium: 8.8 mg/dL — ABNORMAL LOW (ref 8.9–10.3)
Chloride: 103 mmol/L (ref 98–111)
Creatinine, Ser: 2.59 mg/dL — ABNORMAL HIGH (ref 0.61–1.24)
GFR, Estimated: 26 mL/min — ABNORMAL LOW (ref >60)
Glucose, Bld: 201 mg/dL — ABNORMAL HIGH (ref 70–99)
Potassium: 4.2 mmol/L (ref 3.5–5.1)
Sodium: 138 mmol/L (ref 135–145)
Total Bilirubin: 0.8 mg/dL (ref 0.3–1.2)
Total Protein: 6 g/dL — ABNORMAL LOW (ref 6.5–8.1)

## 2020-09-05 LAB — CBC WITH DIFFERENTIAL/PLATELET
Abs Immature Granulocytes: 0.06 10*3/uL (ref 0.00–0.07)
Basophils Absolute: 0.1 10*3/uL (ref 0.0–0.1)
Basophils Relative: 1 %
Eosinophils Absolute: 0.1 10*3/uL (ref 0.0–0.5)
Eosinophils Relative: 2 %
HCT: 29.2 % — ABNORMAL LOW (ref 39.0–52.0)
Hemoglobin: 9.3 g/dL — ABNORMAL LOW (ref 13.0–17.0)
Immature Granulocytes: 1 %
Lymphocytes Relative: 19 %
Lymphs Abs: 1.1 10*3/uL (ref 0.7–4.0)
MCH: 33.1 pg (ref 26.0–34.0)
MCHC: 31.8 g/dL (ref 30.0–36.0)
MCV: 103.9 fL — ABNORMAL HIGH (ref 80.0–100.0)
Monocytes Absolute: 0.8 10*3/uL (ref 0.1–1.0)
Monocytes Relative: 15 %
Neutro Abs: 3.4 10*3/uL (ref 1.7–7.7)
Neutrophils Relative %: 62 %
Platelets: 119 10*3/uL — ABNORMAL LOW (ref 150–400)
RBC: 2.81 MIL/uL — ABNORMAL LOW (ref 4.22–5.81)
RDW: 15.5 % (ref 11.5–15.5)
WBC: 5.5 10*3/uL (ref 4.0–10.5)
nRBC: 0 % (ref 0.0–0.2)

## 2020-09-05 LAB — LACTATE DEHYDROGENASE: LDH: 104 U/L (ref 98–192)

## 2020-09-05 MED ORDER — SODIUM CHLORIDE 0.9 % IV SOLN
20.0000 mg | Freq: Once | INTRAVENOUS | Status: AC
  Administered 2020-09-05: 20 mg via INTRAVENOUS
  Filled 2020-09-05: qty 20

## 2020-09-05 MED ORDER — HEPARIN SOD (PORK) LOCK FLUSH 100 UNIT/ML IV SOLN
500.0000 [IU] | Freq: Once | INTRAVENOUS | Status: AC | PRN
  Administered 2020-09-05: 500 [IU]

## 2020-09-05 MED ORDER — SODIUM CHLORIDE 0.9 % IV SOLN: Freq: Once | INTRAVENOUS | Status: AC

## 2020-09-05 MED ORDER — FAMOTIDINE IN NACL 20-0.9 MG/50ML-% IV SOLN
20.0000 mg | Freq: Once | INTRAVENOUS | Status: AC
  Administered 2020-09-05: 20 mg via INTRAVENOUS
  Filled 2020-09-05: qty 50

## 2020-09-05 MED ORDER — ACETAMINOPHEN 325 MG PO TABS
650.0000 mg | ORAL_TABLET | Freq: Once | ORAL | Status: AC
  Administered 2020-09-05: 650 mg via ORAL
  Filled 2020-09-05: qty 2

## 2020-09-05 MED ORDER — SODIUM CHLORIDE 0.9 % IV SOLN
16.0000 mg/kg | Freq: Once | INTRAVENOUS | Status: AC
  Administered 2020-09-05: 1600 mg via INTRAVENOUS
  Filled 2020-09-05: qty 80

## 2020-09-05 MED ORDER — SODIUM CHLORIDE 0.9% FLUSH
10.0000 mL | INTRAVENOUS | Status: DC | PRN
  Administered 2020-09-05: 10 mL

## 2020-09-05 MED ORDER — DIPHENHYDRAMINE HCL 25 MG PO CAPS
50.0000 mg | ORAL_CAPSULE | Freq: Once | ORAL | Status: AC
  Administered 2020-09-05: 50 mg via ORAL
  Filled 2020-09-05: qty 2

## 2020-09-05 NOTE — Patient Instructions (Signed)
[facility] at [facility] Discharge Instructions  Labs drawn from portacath today   Thank you for choosing [facility] at [facility] to provide your oncology and hematology care.  To afford each patient quality time with our provider, please arrive at least 15 minutes before your scheduled appointment time.   If you have a lab appointment with the Cancer Center please come in thru the Main Entrance and check in at the main information desk.  You need to re-schedule your appointment should you arrive 10 or more minutes late.  We strive to give you quality time with our providers, and arriving late affects you and other patients whose appointments are after yours.  Also, if you no show three or more times for appointments you may be dismissed from the clinic at the providers discretion.     Again, thank you for choosing [facility].  Our hope is that these requests will decrease the amount of time that you wait before being seen by our physicians.       _____________________________________________________________  Should you have questions after your visit to [facility], please contact our office at [phone] and follow the prompts.  Our office hours are 8:00 a.m. and 4:30 p.m. Monday - Friday.  Please note that voicemails left after 4:00 p.m. may not be returned until the following business day.  We are closed weekends and major holidays.  You do have access to a nurse 24-7, just call the main number to the clinic [phone] and do not press any options, hold on the line and a nurse will answer the phone.    For prescription refill requests, have your pharmacy contact our office and allow 72 hours.    Due to Covid, you will need to wear a mask upon entering the hospital. If you do not have a mask, a mask will be given to you at the Main Entrance upon arrival. For doctor visits, patients may have 1 support person age 18  or older with them. For treatment visits, patients can not have anyone with them due to social distancing guidelines and our immunocompromised population.     

## 2020-09-05 NOTE — Patient Instructions (Signed)
[facility] Discharge Instructions for Patients Receiving Chemotherapy   Beginning January 23rd 2017 lab work for the Cancer Center will be done in the  Main lab at [facility] on 1st floor. If you have a lab appointment with the Cancer Center please come in thru the  Main Entrance and check in at the main information desk   Today you received the following chemotherapy agents Daratumumab  To help prevent nausea and vomiting after your treatment, we encourage you to take your nausea medication   If you develop nausea and vomiting, or diarrhea that is not controlled by your medication, call the clinic.  The clinic phone number is [phone]. Office hours are Monday-Friday 8:30am-5:00pm.  BELOW ARE SYMPTOMS THAT SHOULD BE REPORTED IMMEDIATELY:  *FEVER GREATER THAN 101.0 F  *CHILLS WITH OR WITHOUT FEVER  NAUSEA AND VOMITING THAT IS NOT CONTROLLED WITH YOUR NAUSEA MEDICATION  *UNUSUAL SHORTNESS OF BREATH  *UNUSUAL BRUISING OR BLEEDING  TENDERNESS IN MOUTH AND THROAT WITH OR WITHOUT PRESENCE OF ULCERS  *URINARY PROBLEMS  *BOWEL PROBLEMS  UNUSUAL RASH Items with * indicate a potential emergency and should be followed up as soon as possible. If you have an emergency after office hours please contact your primary care physician or go to the nearest emergency department.  Please call the clinic during office hours if you have any questions or concerns.   You may also contact the Patient Navigator at [phone] should you have any questions or need assistance in obtaining follow up care.      Resources For Cancer Patients and their Caregivers ? American Cancer Society: Can assist with transportation, wigs, general needs, runs Look Good Feel Better.        1-888-227-6333 ? Cancer Care: Provides financial assistance, online support groups, medication/co-pay assistance.  1-800-813-HOPE (4673) ? [facility] Assists [place]  cancer patients and their families through emotional , educational and financial support.  336-427-4357 ? [place] DSS Where to apply for food stamps, Medicaid and utility assistance. 336-342-1394 ? [facility]: Transportation to medical appointments. 336-347-2287 ? Social Security Administration: May apply for disability if have a Stage IV cancer. 336-342-7796 1-800-772-1213 ? [place] Aging, Disability and Transit Services: Assists with nutrition, care and transit needs. 336-349-2343          

## 2020-09-05 NOTE — Progress Notes (Signed)
Tom Marshall presents today for D1C6 Daratumumab. Pt denies any new changes or symptoms since last treatment. Lab results, including Cr 2.59, and vitals have been reviewed and are stable and within parameters for treatment. Proceeding with treatment today as planned.  Infusions tolerated without incident or complaint. VSS upon completion of treatment. Port flushed and deaccessed per protocol, see MAR and IV flowsheet for details. Discharged in satisfactory condition with follow up instructions.

## 2020-09-06 LAB — KAPPA/LAMBDA LIGHT CHAINS
Kappa free light chain: 2637.4 mg/L — ABNORMAL HIGH (ref 3.3–19.4)
Kappa, lambda light chain ratio: 561.15 — ABNORMAL HIGH (ref 0.26–1.65)
Lambda free light chains: 4.7 mg/L — ABNORMAL LOW (ref 5.7–26.3)

## 2020-09-06 LAB — IMMUNOFIXATION ELECTROPHORESIS
IgA: 24 mg/dL — ABNORMAL LOW (ref 61–437)
IgG (Immunoglobin G), Serum: 232 mg/dL — ABNORMAL LOW (ref 603–1613)
IgM (Immunoglobulin M), Srm: 8 mg/dL — ABNORMAL LOW (ref 20–172)
Total Protein ELP: 5.7 g/dL — ABNORMAL LOW (ref 6.0–8.5)

## 2020-09-07 LAB — PROTEIN ELECTROPHORESIS, SERUM
A/G Ratio: 1.7 (ref 0.7–1.7)
Albumin ELP: 3.5 g/dL (ref 2.9–4.4)
Alpha-1-Globulin: 0.2 g/dL (ref 0.0–0.4)
Alpha-2-Globulin: 0.8 g/dL (ref 0.4–1.0)
Beta Globulin: 0.9 g/dL (ref 0.7–1.3)
Gamma Globulin: 0.2 g/dL — ABNORMAL LOW (ref 0.4–1.8)
Globulin, Total: 2.1 g/dL — ABNORMAL LOW (ref 2.2–3.9)
M-Spike, %: 0.1 g/dL — ABNORMAL HIGH
Total Protein ELP: 5.6 g/dL — ABNORMAL LOW (ref 6.0–8.5)

## 2020-09-14 ENCOUNTER — Other Ambulatory Visit (HOSPITAL_COMMUNITY): Payer: Self-pay | Admitting: Hematology

## 2020-09-14 DIAGNOSIS — C9000 Multiple myeloma not having achieved remission: Secondary | ICD-10-CM

## 2020-09-19 ENCOUNTER — Inpatient Hospital Stay (HOSPITAL_COMMUNITY): Payer: Medicare Other

## 2020-09-19 ENCOUNTER — Other Ambulatory Visit (HOSPITAL_COMMUNITY): Payer: Self-pay

## 2020-09-19 ENCOUNTER — Other Ambulatory Visit (HOSPITAL_COMMUNITY): Payer: Self-pay | Admitting: *Deleted

## 2020-09-19 ENCOUNTER — Other Ambulatory Visit: Payer: Self-pay

## 2020-09-19 ENCOUNTER — Telehealth (HOSPITAL_COMMUNITY): Payer: Self-pay | Admitting: Pharmacy Technician

## 2020-09-19 ENCOUNTER — Ambulatory Visit (HOSPITAL_COMMUNITY): Payer: Medicare Other | Admitting: Hematology

## 2020-09-19 DIAGNOSIS — Z9221 Personal history of antineoplastic chemotherapy: Secondary | ICD-10-CM | POA: Diagnosis not present

## 2020-09-19 DIAGNOSIS — C9000 Multiple myeloma not having achieved remission: Secondary | ICD-10-CM | POA: Diagnosis not present

## 2020-09-19 DIAGNOSIS — Z923 Personal history of irradiation: Secondary | ICD-10-CM | POA: Diagnosis not present

## 2020-09-19 DIAGNOSIS — Z86718 Personal history of other venous thrombosis and embolism: Secondary | ICD-10-CM | POA: Diagnosis not present

## 2020-09-19 DIAGNOSIS — Z7901 Long term (current) use of anticoagulants: Secondary | ICD-10-CM | POA: Diagnosis not present

## 2020-09-19 DIAGNOSIS — Z5112 Encounter for antineoplastic immunotherapy: Secondary | ICD-10-CM | POA: Diagnosis not present

## 2020-09-19 LAB — COMPREHENSIVE METABOLIC PANEL
ALT: 23 U/L (ref 0–44)
AST: 16 U/L (ref 15–41)
Albumin: 3.3 g/dL — ABNORMAL LOW (ref 3.5–5.0)
Alkaline Phosphatase: 62 U/L (ref 38–126)
Anion gap: 10 (ref 5–15)
BUN: 42 mg/dL — ABNORMAL HIGH (ref 8–23)
CO2: 23 mmol/L (ref 22–32)
Calcium: 9 mg/dL (ref 8.9–10.3)
Chloride: 104 mmol/L (ref 98–111)
Creatinine, Ser: 2.78 mg/dL — ABNORMAL HIGH (ref 0.61–1.24)
GFR, Estimated: 24 mL/min — ABNORMAL LOW (ref >60)
Glucose, Bld: 269 mg/dL — ABNORMAL HIGH (ref 70–99)
Potassium: 4.2 mmol/L (ref 3.5–5.1)
Sodium: 137 mmol/L (ref 135–145)
Total Bilirubin: 0.4 mg/dL (ref 0.3–1.2)
Total Protein: 5.6 g/dL — ABNORMAL LOW (ref 6.5–8.1)

## 2020-09-19 LAB — LACTATE DEHYDROGENASE: LDH: 99 U/L (ref 98–192)

## 2020-09-19 LAB — CBC WITH DIFFERENTIAL/PLATELET
Abs Immature Granulocytes: 0.02 10*3/uL (ref 0.00–0.07)
Basophils Absolute: 0 10*3/uL (ref 0.0–0.1)
Basophils Relative: 1 %
Eosinophils Absolute: 0.1 10*3/uL (ref 0.0–0.5)
Eosinophils Relative: 3 %
HCT: 24.9 % — ABNORMAL LOW (ref 39.0–52.0)
Hemoglobin: 7.9 g/dL — ABNORMAL LOW (ref 13.0–17.0)
Immature Granulocytes: 1 %
Lymphocytes Relative: 18 %
Lymphs Abs: 0.7 10*3/uL (ref 0.7–4.0)
MCH: 33.1 pg (ref 26.0–34.0)
MCHC: 31.7 g/dL (ref 30.0–36.0)
MCV: 104.2 fL — ABNORMAL HIGH (ref 80.0–100.0)
Monocytes Absolute: 0.5 10*3/uL (ref 0.1–1.0)
Monocytes Relative: 11 %
Neutro Abs: 2.8 10*3/uL (ref 1.7–7.7)
Neutrophils Relative %: 66 %
Platelets: 81 10*3/uL — ABNORMAL LOW (ref 150–400)
RBC: 2.39 MIL/uL — ABNORMAL LOW (ref 4.22–5.81)
RDW: 15.2 % (ref 11.5–15.5)
WBC: 4.2 10*3/uL (ref 4.0–10.5)
nRBC: 0 % (ref 0.0–0.2)

## 2020-09-19 MED ORDER — FUROSEMIDE 40 MG PO TABS: 60.0000 mg | ORAL_TABLET | Freq: Every day | ORAL | 1 refills | Status: AC

## 2020-09-19 MED ORDER — XPOVIO (100 MG ONCE WEEKLY) 50 MG PO TBPK: 50.0000 mg | ORAL_TABLET | ORAL | 0 refills | Status: DC

## 2020-09-19 NOTE — Progress Notes (Signed)
Patient presents today for treatment. Verbal order received from [place] RN/ Dr. Delton Coombes NO treatment today. Patient will see Dr. Delton Coombes on Monday, November 29th to discuss labs and plan of care. Vital signs stable. Patient states he fell in the shower on Tuesday and hit the back of his head. No bruising noted. Patient denies any pain. Labs drawn today. No complaints at this time. Discharged from clinic ambulatory in stable condition. Alert and oriented x 3. F/U with [facility] as scheduled.

## 2020-09-19 NOTE — Telephone Encounter (Signed)
Oral Oncology Patient Advocate Encounter  Received notification from [place] [facility] that prior authorization for Xpovio is required.  PA submitted on CoverMyMeds Key [id number] Status is pending  Oral Oncology Clinic will continue to follow.  [place] Patient [place] Phone [phone] Fax [phone] 09/20/2020 1:59 PM

## 2020-09-19 NOTE — Telephone Encounter (Signed)
Prescription sent for Selinexor 50mg  weekly per Dr. Delton Coombes

## 2020-09-19 NOTE — Progress Notes (Signed)
Patient presents today for PORT flush and labs.  D.[facility] spoke with the patient about Dr.Katragadda plan to change his treatment plan.  No treatment today.   Vital signs stable.  No complaints at this time.  Discharge from clinic ambulatory in stable condition.  Alert and oriented X 3.  Follow up with [facility] as scheduled.

## 2020-09-19 NOTE — Telephone Encounter (Signed)
Patient concerned about swelling in his legs and feels like he is holding fluid in his abdomen.  Some abdominal distention noted.    Per Dr. Delton Coombes patient can increase lasix to 60 mg daily.  Patient is aware and new prescription sent to his pharmacy.

## 2020-09-20 ENCOUNTER — Telehealth (HOSPITAL_COMMUNITY): Payer: Self-pay | Admitting: Pharmacist

## 2020-09-20 DIAGNOSIS — C9000 Multiple myeloma not having achieved remission: Secondary | ICD-10-CM

## 2020-09-20 MED ORDER — XPOVIO (40 MG ONCE WEEKLY) 40 MG PO TBPK: 40.0000 mg | ORAL_TABLET | ORAL | 0 refills | Status: DC

## 2020-09-20 NOTE — Telephone Encounter (Signed)
Oral Oncology Patient Advocate Encounter  Prior Authorization for Tom Marshall has been approved.    PA# [id number]  Effective dates: 06/22/20 through 09/20/21  Due to this being an LDD drug, we have sent the prescription to [facility] pharmacy.  We will follow up with them regarding copay and assistance.  Oral Oncology Clinic will continue to follow.   [place] Patient [place] Phone [phone] Fax [phone] 09/20/2020 2:01 PM

## 2020-09-20 NOTE — Telephone Encounter (Signed)
Oral Oncology Pharmacist Encounter  Received new prescription for Xpovio (selinexor) for the treatment of IgG kappa multiple myeloma, planned duration until disease progression or unacceptable drug toxicity.  CMP/CBC from 09/20/20 assessed, hgb 7.9 g/dL patient starting on a reduced dose, continue to monitor hgb. Prescription dose and frequency assessed.   Discussed with Lilia Pro, RN navigator the need for an ondansetron Rx due to the emetogenic risk of Xpovio.  Current medication list in Epic reviewed, no DDIs with selinexor identified.  Evaluated chart and no patient barriers to medication adherence identified.   Prescription has been e-scribed to [facility] (due to the medication being limited distrubution it can not be filled at [facility]) for benefits analysis and approval.  Oral Oncology Clinic will continue to follow for insurance authorization, copayment issues, initial counseling and start date.  Darl Pikes, PharmD, BCPS, BCOP, CPP Hematology/Oncology Clinical Pharmacist Practitioner [facility]/HP/AP [facility] [phone]  09/20/2020 9:42 AM

## 2020-09-21 ENCOUNTER — Other Ambulatory Visit (HOSPITAL_COMMUNITY): Payer: Self-pay | Admitting: Hematology

## 2020-09-26 ENCOUNTER — Other Ambulatory Visit: Payer: Self-pay

## 2020-09-26 ENCOUNTER — Other Ambulatory Visit (HOSPITAL_COMMUNITY): Payer: Self-pay | Admitting: Surgery

## 2020-09-26 ENCOUNTER — Other Ambulatory Visit (HOSPITAL_COMMUNITY): Payer: Self-pay

## 2020-09-26 ENCOUNTER — Other Ambulatory Visit (HOSPITAL_COMMUNITY): Payer: Self-pay | Admitting: *Deleted

## 2020-09-26 ENCOUNTER — Inpatient Hospital Stay (HOSPITAL_BASED_OUTPATIENT_CLINIC_OR_DEPARTMENT_OTHER): Payer: Medicare Other | Admitting: Hematology

## 2020-09-26 VITALS — BP 126/70 | HR 83 | Temp 97.6°F | Resp 18

## 2020-09-26 DIAGNOSIS — Z85528 Personal history of other malignant neoplasm of kidney: Secondary | ICD-10-CM | POA: Diagnosis not present

## 2020-09-26 DIAGNOSIS — N170 Acute kidney failure with tubular necrosis: Secondary | ICD-10-CM | POA: Diagnosis not present

## 2020-09-26 DIAGNOSIS — D649 Anemia, unspecified: Secondary | ICD-10-CM | POA: Diagnosis not present

## 2020-09-26 DIAGNOSIS — Z905 Acquired absence of kidney: Secondary | ICD-10-CM | POA: Diagnosis not present

## 2020-09-26 DIAGNOSIS — N184 Chronic kidney disease, stage 4 (severe): Secondary | ICD-10-CM | POA: Diagnosis not present

## 2020-09-26 DIAGNOSIS — R21 Rash and other nonspecific skin eruption: Secondary | ICD-10-CM | POA: Diagnosis not present

## 2020-09-26 DIAGNOSIS — R0602 Shortness of breath: Secondary | ICD-10-CM | POA: Diagnosis not present

## 2020-09-26 DIAGNOSIS — C9000 Multiple myeloma not having achieved remission: Secondary | ICD-10-CM | POA: Diagnosis not present

## 2020-09-26 DIAGNOSIS — J90 Pleural effusion, not elsewhere classified: Secondary | ICD-10-CM | POA: Diagnosis not present

## 2020-09-26 DIAGNOSIS — N179 Acute kidney failure, unspecified: Secondary | ICD-10-CM | POA: Diagnosis not present

## 2020-09-26 DIAGNOSIS — I129 Hypertensive chronic kidney disease with stage 1 through stage 4 chronic kidney disease, or unspecified chronic kidney disease: Secondary | ICD-10-CM | POA: Diagnosis not present

## 2020-09-26 DIAGNOSIS — E875 Hyperkalemia: Secondary | ICD-10-CM | POA: Diagnosis not present

## 2020-09-26 DIAGNOSIS — Z20822 Contact with and (suspected) exposure to covid-19: Secondary | ICD-10-CM | POA: Diagnosis not present

## 2020-09-26 NOTE — Patient Instructions (Addendum)
[facility] Discharge Instructions  You were seen today by Dr. Delton Coombes. He went over your recent results. You will be started on selinexor tablets 40 mg once a week along with weekly Velcade injections; take the Compazine 1 hour before taking the selinexor. Continue taking the Decadron tablets weekly. Stop taking the gabapentin for a week and see if your numbness improves. Take the Lomotil as needed for your diarrhea to prevent you from getting dehydrated. Dr. Delton Coombes will see you back in 1 week for labs and follow up.   Thank you for choosing [place] at [facility] to provide your oncology and hematology care.  To afford each patient quality time with our provider, please arrive at least 15 minutes before your scheduled appointment time.   If you have a lab appointment with the [facility] please come in thru the Main Entrance and check in at the main information desk  You need to re-schedule your appointment should you arrive 10 or more minutes late.  We strive to give you quality time with our providers, and arriving late affects you and other patients whose appointments are after yours.  Also, if you no show three or more times for appointments you may be dismissed from the clinic at the providers discretion.     Again, thank you for choosing [facility].  Our hope is that these requests will decrease the amount of time that you wait before being seen by our physicians.       _____________________________________________________________  Should you have questions after your visit to [facility], please contact our office at [phone] between the hours of 8:00 a.m. and 4:30 p.m.  Voicemails left after 4:00 p.m. will not be returned until the following business day.  For prescription refill requests, have your pharmacy contact our office and allow 72 hours.    Cancer Center Support Programs:   >  Cancer Support Group  2nd Tuesday of the month 1pm-2pm, Journey Room

## 2020-09-26 NOTE — Progress Notes (Signed)
[place], [place] [address]   CLINIC:  Medical Oncology/Hematology  PCP:  Tom Noble, MD [address] / [place] [address] [phone]   REASON FOR VISIT:  Follow-up for multiple myeloma  PRIOR THERAPY:  1. Revlimid x 4 cycles from 09/05/2017 to 02/18/2018 with Revlimid maintenance. 2. Pomalyst from 06/20/2020 to 09/19/2020. 3. Darzalex x 6 cycles from 04/11/2020 to 09/05/2020.  NGS Results: Not done  CURRENT THERAPY: Velcade and selinexor to begin  BRIEF ONCOLOGIC HISTORY:  Oncology History  Multiple myeloma ([place])  08/29/2017 Initial Diagnosis   Multiple myeloma ([place])   12/10/2017 - 03/11/2018 Chemotherapy   The patient had dexamethasone (DECADRON) 4 MG tablet, 1 of 1 cycle, Start date: --, End date: -- lenalidomide (REVLIMID) 25 MG capsule, 1 of 1 cycle, Start date: --, End date: -- bortezomib SQ (VELCADE) chemo injection 2.75 mg, 1.3 mg/m2 = 2.75 mg, Subcutaneous,  Once, 5 of 5 cycles Administration: 2.75 mg (12/10/2017), 2.75 mg (12/17/2017), 2.75 mg (12/31/2017), 2.75 mg (01/21/2018), 2.75 mg (02/18/2018), 2.75 mg (03/11/2018)  for chemotherapy treatment.    04/11/2020 -  Chemotherapy   The patient had daratumumab (DARZALEX) 800 mg in sodium chloride 0.9 % 960 mL (0.8 mg/mL) chemo infusion, 8 mg/kg = 800 mg (100 % of original dose 8 mg/kg), Intravenous, Once, 1 of 1 cycle Dose modification: 8 mg/kg (original dose 8 mg/kg, Cycle 1, Reason: Provider Judgment, Comment: giving total dose 104m/kg over 2 days) daratumumab (DARZALEX) 800 mg in sodium chloride 0.9 % 460 mL (1.6 mg/mL) chemo infusion, 8 mg/kg = 800 mg (50 % of original dose 16 mg/kg), Intravenous, Once, 2 of 2 cycles Dose modification: 8 mg/kg (original dose 16 mg/kg, Cycle 1, Reason: Provider Judgment, Comment: giving 16 mg/kg over 2 days. thus 8 mg/kg in 2 divided doses.) Administration: 1,600 mg (04/19/2020), 1,600 mg (05/03/2020), 1,600 mg (05/09/2020), 800 mg  (04/11/2020), 800 mg (04/12/2020) daratumumab (DARZALEX) 1,600 mg in sodium chloride 0.9 % 420 mL chemo infusion, 16 mg/kg = 1,600 mg, Intravenous, Once, 5 of 6 cycles Administration: 1,600 mg (05/16/2020), 1,600 mg (05/23/2020), 1,600 mg (05/30/2020), 1,600 mg (06/06/2020), 1,600 mg (06/13/2020), 1,600 mg (06/27/2020), 1,600 mg (07/11/2020), 1,600 mg (07/25/2020), 1,600 mg (08/08/2020), 1,600 mg (08/22/2020), 1,600 mg (09/05/2020)  for chemotherapy treatment.      CANCER STAGING: Cancer Staging No matching staging information was found for the patient.  INTERVAL HISTORY:  [name] a 69y.o. male, returns for routine follow-up and consideration for next cycle of immunotherapy. [name] last seen on 08/22/2020.  Overall, he tells me he has been feeling okay. He reports that he stopped taking the Pomalyst last week and denies seeing any improvement. He reports having early satiety, gurgling and diarrhea along with abdominal pain about 20 minutes after eating and only ate a ham sandwich today. He reports that 1 tablet of Lomotil does not stop the diarrhea, while 2 tablets helps stop the diarrhea. His right chest wall pain has resolved. He remembers tolerating the Velcade well during his previous treatment though he reports having constant numbness and tingling in his fingertips and numbness in his feet when reclining but without burning or hurting; he reports occasionally dropping things and has to look at his shirt to button it up. He continues taking gabapentin at bedtime. He is taking Decadron 20 mg every week, on Tuesdays. He continues taking Lasix daily. [facility].  He will stop taking Darzalex and will start Velcade and selinexor next week.   REVIEW OF SYSTEMS:  Review of Systems  Constitutional: Positive for appetite change (50%) and fatigue (50%).  Respiratory: Positive for shortness of breath.   Gastrointestinal: Positive  for abdominal pain (20 minutes after eating) and diarrhea.  Neurological: Positive for numbness (& tingling in fingertips).  All other systems reviewed and are negative.   PAST MEDICAL/SURGICAL HISTORY:  Past Medical History:  Diagnosis Date   Cancer [facility])    multiple myeloma   Cancer of right kidney ([place])    Cervical dystonia    Diabetes mellitus without complication ([place])    Gout    Hypercholesteremia    Past Surgical History:  Procedure Laterality Date   BONE MARROW BIOPSY     CHOLECYSTECTOMY  2007   COLONOSCOPY WITH PROPOFOL N/A 01/16/2018   Procedure: COLONOSCOPY WITH PROPOFOL;  Surgeon: Daneil Dolin, MD;  Location: [facility] END[facility];  Service: Endoscopy;  Laterality: N/A;  1:45pm   ESOPHAGOGASTRODUODENOSCOPY (EGD) WITH PROPOFOL N/A 01/16/2018   Procedure: ESOPHAGOGASTRODUODENOSCOPY (EGD) WITH PROPOFOL;  Surgeon: Daneil Dolin, MD;  Location: [facility];  Service: Endoscopy;  Laterality: N/A;   GIVENS CAPSULE STUDY N/A 05/12/2018   Procedure: GIVENS CAPSULE STUDY;  Surgeon: Daneil Dolin, MD;  Location: [facility] ENDO SU[facility];  Service: Endoscopy;  Laterality: N/A;  7:30am   NEPHRECTOMY Right 1998   cancer   PORTACATH PLACEMENT Right 04/07/2020   Procedure: INSERTION PORT-A-CATH (attached catherter in right internal jugular);  Surgeon: Virl Cagey, MD;  Location: [facility] ORS;  Service: General;  Laterality: Right;    SOCIAL HISTORY:  Social History   Socioeconomic History   Marital status: Single    Spouse name: Not on file   Number of children: Not on file   Years of education: Not on file   Highest education level: Not on file  Occupational History   Occupation: Part-time Clinical cytogeneticist, Insurance underwriter  Tobacco Use   Smoking status: Never Smoker   Smokeless tobacco: Former Systems developer    Types: Nurse, children's Use: Never used  Substance and Sexual Activity   Alcohol use: No   Drug use: No   Sexual activity: Not Currently  Other Topics Concern    Not on file  Social History Narrative   Not on file   Social Determinants of Health   Financial Resource Strain: Low Risk    Difficulty of Paying Living Expenses: Not hard at all  Food Insecurity: No Food Insecurity   Worried About Charity fundraiser in the Last Year: Never true   [place] in the Last Year: Never true  Transportation Needs: No Transportation Needs   Lack of Transportation (Medical): No   Lack of Transportation (Non-Medical): No  Physical Activity: Inactive   Days of Exercise per Week: 0 days   Minutes of Exercise per Session: 0 min  Stress: No Stress Concern Present   Feeling of Stress : Not at all  Social Connections: Moderately Integrated   Frequency of Communication with Friends and Family: More than three times a week   Frequency of Social Gatherings with Friends and Family: More than three times a week   Attends Religious Services: More than 4 times per year   Active Member of [employer] or Organizations: Yes   Attends Music therapist: More than 4 times per year   Marital Status: Never married  Intimate Partner Violence: Not At  Risk   Fear of Current or Ex-Partner: No   Emotionally Abused: No   Physically Abused: No   Sexually Abused: No    FAMILY HISTORY:  Family History  Problem Relation Age of Onset   Heart failure Mother 69   Dementia Father    Colon cancer Neg Hx     CURRENT MEDICATIONS:  Current Outpatient Medications  Medication Sig Dispense Refill   acyclovir (ZOVIRAX) 400 MG tablet TAKE (1/2) TABLET BY MOUTH TWICE DAILY. 30 tablet 0   alfuzosin (UROXATRAL) 10 MG 24 hr tablet Take 1 tablet (10 mg total) by mouth daily with breakfast. 30 tablet 11   allopurinol (ZYLOPRIM) 300 MG tablet Take 300 mg by mouth every morning.     B-D UF III MINI PEN NEEDLES 31G X 5 MM MISC   4   cyclobenzaprine (FLEXERIL) 10 MG tablet TAKE 1 TABLET THREE TIMES DAILY AS NEEDED FOR MUSCLE SPASM 30 tablet 0    DARATUMUMAB IV Inject into the vein. Weeks 1-8 weekly x 8 doses; then every 2 weeks x 8 doses; then every 28 days     dexamethasone (DECADRON) 4 MG tablet Take 5 tablets (20 mg total) by mouth once a week. 20 tablet 3   dicyclomine (BENTYL) 10 MG capsule Take 10 mg by mouth 2 (two) times daily as needed.      diphenoxylate-atropine (LOMOTIL) 2.5-0.025 MG tablet Take by mouth.     ELIQUIS 2.5 MG TABS tablet TAKE 1 TABLET BY MOUTH TWICE DAILY AFTER COMPLETION OF THE STARTER PACK. (Patient taking differently: Take 2.5 mg by mouth daily. ) 60 tablet 11   furosemide (LASIX) 40 MG tablet Take 1.5 tablets (60 mg total) by mouth daily. 45 tablet 1   gabapentin (NEURONTIN) 300 MG capsule Take 2 capsules (600 mg total) by mouth at bedtime. 60 capsule 3   glipiZIDE (GLUCOTROL XL) 10 MG 24 hr tablet TAKE (1) TABLET BY MOUTH DAILY WITH BREAKFAST. 30 tablet 0   HYDROcodone-acetaminophen (NORCO/VICODIN) 5-325 MG tablet Take 1 tablet by mouth 2 (two) times daily as needed for severe pain. 60 tablet 0   lisinopril (ZESTRIL) 5 MG tablet Take 5 mg by mouth daily.     Multiple Vitamins-Minerals (CENTRUM SILVER 50+MEN) TABS Take 1 tablet by mouth every morning.     NOVOLOG FLEXPEN 100 UNIT/ML FlexPen Inject into the skin.     pravastatin (PRAVACHOL) 20 MG tablet Take 20 mg by mouth at bedtime.      prochlorperazine (COMPAZINE) 10 MG tablet Take 1 tablet (10 mg total) by mouth every 6 (six) hours as needed (Nausea or vomiting). 30 tablet 1   selinexor (XPOVIO) 40 MG TBPK Take 40 mg by mouth once a week. 4 each 0   sodium bicarbonate 650 MG tablet Take 1 tablet (650 mg total) by mouth 2 (two) times daily. (Patient taking differently: Take 1,300 mg by mouth 2 (two) times daily. ) 60 tablet 1   traZODone (DESYREL) 100 MG tablet Take 1 tablet (100 mg total) by mouth at bedtime. 30 tablet 3   TRESIBA FLEXTOUCH 200 UNIT/ML SOPN Inject 44 Units into the skin daily.      TRUE METRIX BLOOD GLUCOSE TEST test  strip      lidocaine-prilocaine (EMLA) cream Apply small amount over port site 1 hour prior to appointment.  Cover with plastic wrap. (Patient not taking: Reported on 09/26/2020) 30 g 3   No current facility-administered medications for this visit.   Facility-Administered Medications Ordered in Other  Visits  Medication Dose Route Frequency Provider Last Rate Last Admin   0.9 %  sodium chloride infusion  250 mL Intravenous Once Twana First, MD        ALLERGIES:  No Known Allergies  PHYSICAL EXAM:  Performance status (ECOG): 1 - Symptomatic but completely ambulatory  Vitals:   09/26/20 1510  BP: 126/70  Pulse: 83  Resp: 18  Temp: 97.6 F (36.4 C)  SpO2: 96%   Wt Readings from Last 3 Encounters:  09/19/20 235 lb 3.7 oz (106.7 kg)  09/05/20 233 lb 6.4 oz (105.9 kg)  08/22/20 235 lb 12.8 oz (107 kg)   Physical Exam Vitals reviewed.  Constitutional:      Appearance: Normal appearance. He is obese.  Cardiovascular:     Rate and Rhythm: Normal rate and regular rhythm.     Pulses: Normal pulses.     Heart sounds: Normal heart sounds.  Pulmonary:     Effort: Pulmonary effort is normal.     Breath sounds: Normal breath sounds.  Abdominal:     Palpations: Abdomen is soft.     Tenderness: There is no abdominal tenderness.  Musculoskeletal:     Thoracic back: No tenderness or bony tenderness.     Right lower leg: No edema.     Left lower leg: No edema.  Neurological:     General: No focal deficit present.     Mental Status: He is alert and oriented to person, place, and time.  Psychiatric:        Mood and Affect: Mood normal.        Behavior: Behavior normal.     LABORATORY DATA:  I have reviewed the labs as listed.  CBC Latest Ref Rng & Units 09/19/2020 09/05/2020 08/22/2020  WBC 4.0 - 10.5 K/uL 4.2 5.5 2.8(L)  Hemoglobin 13.0 - 17.0 g/dL 7.9(L) 9.3(L) 8.6(L)  Hematocrit 39 - 52 % 24.9(L) 29.2(L) 26.6(L)  Platelets 150 - 400 K/uL 81(L) 119(L) 74(L)   CMP Latest  Ref Rng & Units 09/19/2020 09/05/2020 08/22/2020  Glucose 70 - 99 mg/dL 269(H) 201(H) 140(H)  BUN 8 - 23 mg/dL 42(H) 36(H) 41(H)  Creatinine 0.61 - 1.24 mg/dL 2.78(H) 2.59(H) 2.55(H)  Sodium 135 - 145 mmol/L 137 138 136  Potassium 3.5 - 5.1 mmol/L 4.2 4.2 4.1  Chloride 98 - 111 mmol/L 104 103 101  CO2 22 - 32 mmol/L _0 Calcium 8.9 - 10.3 mg/dL 9.0 8.8(L) 9.1  Total Protein 6.5 - 8.1 g/dL 5.6(L) 6.0(L) 5.8(L)  Total Bilirubin 0.3 - 1.2 mg/dL 0.4 0.8 0.6  Alkaline Phos 38 - 126 U/L 62 66 63  AST 15 - 41 U/L 16 15 12(L)  ALT 0 - 44 U/L _1 Lab Results  Component Value Date   LDH 99 09/19/2020   LDH 104 09/05/2020   LDH 99 08/08/2020   Lab Results  Component Value Date   TOTALPROTELP 5.6 (L) 09/05/2020   TOTALPROTELP 5.7 (L) 09/05/2020   ALBUMINELP 3.5 09/05/2020   A1GS 0.2 09/05/2020   A2GS 0.8 09/05/2020   BETS 0.9 09/05/2020   GAMS 0.2 (L) 09/05/2020   MSPIKE 0.1 (H) 09/05/2020   SPEI Comment 09/05/2020    Lab Results  Component Value Date   KPAFRELGTCHN 2,637.4 (H) 09/05/2020   LAMBDASER 4.7 (L) 09/05/2020   KAPLAMBRATIO 561.15 (H) 09/05/2020    DIAGNOSTIC IMAGING:  I have independently reviewed the scans and discussed with the patient. No results found.  ASSESSMENT:  1. IgG kappa multiple myeloma: -4 cycles of RVD from 08/30/2017 through 02/18/2018. -Declined bone marrow transplant. Received maintenance Revlimid 2.5 mg 3 weeks on 1 week off. -Bone marrow biopsy on 02/23/2020 shows 21% plasma cells. Occasional sections had up to 75% myeloma cells. -Chromosome analysis was normal. FISH panel was positive for gain of 1 q., monosomy 13, del 17 P, t(14;20) -PET scan showed new left frontal destructive lesion. Numerous areas of lytic changes in the spine. Lucent areas in T6 with loss of height at T5. Numerous lytic changes throughout the spine noted along with pedicle and lamina and transverse process of T8 vertebral body. Profound hypermetabolic  activity within the ribs bilaterally. -Daratumumab was started on 04/11/2020.Pomalidomide 2 mg 3 weeks on 1 week off started on 06/20/2020.  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 -Completed XRT on 04/05/2020. -MRI thoracic spine on 07/26/2020 showed significant overall improvement in multiple myeloma with much of the bone marrow has returned to normal fatty marrow.  Ventral epidural tumor on the right of T5 near completely resolved.  Proximal right T7 rib lesion also resolved.  3. 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 He is on Eliquis.   PLAN:  1. IgG kappa multiple myeloma: -We have reviewed myeloma labs from 09/05/2020.  M spike is stable at 0.1 g.  However kappa free light chains increased to 2637 and ratio increased to 561.  Immunofixation shows IgG kappa. -Based on worsening of last 2 free light chain ratios, I have recommended switching treatments. -Because of his high risk FISH panel, I have recommended regimen containing Proteasome inhibitor and and a novel agent. -We discussed selinexor, bortezomib and dexamethasone regimen.  We will start him on selinexor low-dose 40 mg weekly and titrated up as tolerated.  Bortezomib is 1.3 mg per metered square once weekly for 4 weeks every 35 days.  He will continue dexamethasone 20 mg once weekly. 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  We will closely monitor.  If there is any worsening, will cut back on dose of bortezomib. -Tentatively we will start his first cycle next week.  He has held his pomalidomide last week. -He has intermittent abdominal cramping and diarrhea from pomalidomide.  This is likely to get better.  He will use Lomotil as needed. -We discussed side  effects of selinexor in detail including but not limited to GI toxicity, fluid retention, cytopenias among others.  We will see him back in 2 weeks after start of treatment to see how he is tolerating.  2. CKD: -Creatinine is being stable between 2.5 and 2.8.  3. Left leg DVT: -Continue Eliquis 2.5 mg daily.  No bleeding issues.  4.Diabetes: -Continue Tresiba and NovoLog.  Continue follow-up with Dr. Willey Blade.  5. Insomnia: -Continue trazodone 100 mg at bedtime.  6. Lower extremity swelling: -Continue Lasix 40 mg daily.  Continue compression stockings.  7. Urinary hesitancy: -Continue Flomax 0.4 mg daily.  8. 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  He is not seeing any difference.  He will stop gabapentin.  9. Right posterior chest wall pain: -This has completely resolved.  He is not requiring pain medication.   Orders placed this  encounter:  No orders of the defined types were placed in this encounter.    Tom Jack, MD [place] [phone]   I, Milinda Antis, am acting as a scribe for Dr. Sanda Linger.  I, Tom Jack MD, have reviewed the above documentation for accuracy and completeness, and I agree with the above.

## 2020-09-26 NOTE — Progress Notes (Signed)
DISCONTINUE ON PATHWAY REGIMEN - Multiple Myeloma and Other Plasma Cell Dyscrasias     Cycles 1 and 2: A cycle is every 28 days:     Pomalidomide      Dexamethasone      Daratumumab    Cycles 3 through 6: A cycle is every 28 days:     Pomalidomide      Dexamethasone      Dexamethasone      Daratumumab    Cycles 7 and beyond: A cycle is every 28 days:     Pomalidomide      Dexamethasone      Dexamethasone      Daratumumab   **Always confirm dose/schedule in your pharmacy ordering system**  REASON: Disease Progression PRIOR TREATMENT: [id number]: DaraPd (Daratumumab 16 mg/kg IV + Pomalidomide + Dexamethasone) Until Progression or Unacceptable Toxicity TREATMENT RESPONSE: Progressive Disease (PD)  START ON PATHWAY REGIMEN - Multiple Myeloma and Other Plasma Cell Dyscrasias     A cycle is every 35 days:     Dexamethasone      Selinexor      Bortezomib   **Always confirm dose/schedule in your pharmacy ordering system**  Patient Characteristics: Multiple Myeloma, Relapsed / Refractory, Second through Fourth Lines of Therapy, Fit or Candidate for Triplet Therapy, Lenalidomide-Refractory or Lenalidomide-based Regimen Not Preferred, Not a Candidate for Anti-CD38 Antibody Disease Classification: Multiple Myeloma R-ISS Staging: Unknown Therapeutic Status: Relapsed Line of Therapy: Third Line Anti-CD38 Antibody Candidacy: Not a Candidate for Anti-CD38 Antibody Lenalidomide-based Regimen Preference/Candidacy: Lenalidomide-Refractory Intent of Therapy: Non-Curative / Palliative Intent, Discussed with Patient

## 2020-09-27 DIAGNOSIS — E1129 Type 2 diabetes mellitus with other diabetic kidney complication: Secondary | ICD-10-CM | POA: Diagnosis not present

## 2020-09-28 ENCOUNTER — Other Ambulatory Visit: Payer: Self-pay

## 2020-09-28 ENCOUNTER — Encounter (HOSPITAL_COMMUNITY): Payer: Self-pay

## 2020-09-28 ENCOUNTER — Encounter (HOSPITAL_COMMUNITY): Payer: Self-pay | Admitting: *Deleted

## 2020-09-28 ENCOUNTER — Emergency Department (HOSPITAL_COMMUNITY): Payer: Medicare Other

## 2020-09-28 ENCOUNTER — Telehealth (HOSPITAL_COMMUNITY): Payer: Self-pay | Admitting: Pharmacist

## 2020-09-28 ENCOUNTER — Inpatient Hospital Stay (HOSPITAL_COMMUNITY)
Admission: EM | Admit: 2020-09-28 | Discharge: 2020-10-05 | DRG: 674 | Disposition: A | Discharge status: Home / Self Care | Payer: Medicare Other | Attending: Internal Medicine | Admitting: Internal Medicine

## 2020-09-28 ENCOUNTER — Inpatient Hospital Stay (HOSPITAL_COMMUNITY): Payer: Medicare Other | Attending: Hematology

## 2020-09-28 ENCOUNTER — Encounter (HOSPITAL_COMMUNITY): Payer: Self-pay | Admitting: Emergency Medicine

## 2020-09-28 ENCOUNTER — Inpatient Hospital Stay (HOSPITAL_COMMUNITY): Payer: Medicare Other

## 2020-09-28 DIAGNOSIS — E7800 Pure hypercholesterolemia, unspecified: Secondary | ICD-10-CM | POA: Diagnosis present

## 2020-09-28 DIAGNOSIS — N170 Acute kidney failure with tubular necrosis: Secondary | ICD-10-CM | POA: Diagnosis not present

## 2020-09-28 DIAGNOSIS — Z794 Long term (current) use of insulin: Secondary | ICD-10-CM | POA: Diagnosis not present

## 2020-09-28 DIAGNOSIS — E1169 Type 2 diabetes mellitus with other specified complication: Secondary | ICD-10-CM

## 2020-09-28 DIAGNOSIS — Z87891 Personal history of nicotine dependence: Secondary | ICD-10-CM | POA: Diagnosis not present

## 2020-09-28 DIAGNOSIS — C9002 Multiple myeloma in relapse: Secondary | ICD-10-CM

## 2020-09-28 DIAGNOSIS — E785 Hyperlipidemia, unspecified: Secondary | ICD-10-CM | POA: Diagnosis present

## 2020-09-28 DIAGNOSIS — Z905 Acquired absence of kidney: Secondary | ICD-10-CM

## 2020-09-28 DIAGNOSIS — C9000 Multiple myeloma not having achieved remission: Secondary | ICD-10-CM | POA: Diagnosis present

## 2020-09-28 DIAGNOSIS — Z9049 Acquired absence of other specified parts of digestive tract: Secondary | ICD-10-CM

## 2020-09-28 DIAGNOSIS — I129 Hypertensive chronic kidney disease with stage 1 through stage 4 chronic kidney disease, or unspecified chronic kidney disease: Secondary | ICD-10-CM | POA: Diagnosis present

## 2020-09-28 DIAGNOSIS — N179 Acute kidney failure, unspecified: Principal | ICD-10-CM | POA: Diagnosis present

## 2020-09-28 DIAGNOSIS — Z85528 Personal history of other malignant neoplasm of kidney: Secondary | ICD-10-CM | POA: Diagnosis not present

## 2020-09-28 DIAGNOSIS — Z7984 Long term (current) use of oral hypoglycemic drugs: Secondary | ICD-10-CM | POA: Diagnosis not present

## 2020-09-28 DIAGNOSIS — T451X5A Adverse effect of antineoplastic and immunosuppressive drugs, initial encounter: Secondary | ICD-10-CM | POA: Diagnosis present

## 2020-09-28 DIAGNOSIS — E8770 Fluid overload, unspecified: Secondary | ICD-10-CM | POA: Diagnosis present

## 2020-09-28 DIAGNOSIS — Z86718 Personal history of other venous thrombosis and embolism: Secondary | ICD-10-CM

## 2020-09-28 DIAGNOSIS — N184 Chronic kidney disease, stage 4 (severe): Secondary | ICD-10-CM | POA: Diagnosis present

## 2020-09-28 DIAGNOSIS — N281 Cyst of kidney, acquired: Secondary | ICD-10-CM | POA: Diagnosis not present

## 2020-09-28 DIAGNOSIS — M109 Gout, unspecified: Secondary | ICD-10-CM | POA: Diagnosis present

## 2020-09-28 DIAGNOSIS — D649 Anemia, unspecified: Secondary | ICD-10-CM | POA: Diagnosis not present

## 2020-09-28 DIAGNOSIS — N289 Disorder of kidney and ureter, unspecified: Secondary | ICD-10-CM | POA: Diagnosis not present

## 2020-09-28 DIAGNOSIS — X58XXXA Exposure to other specified factors, initial encounter: Secondary | ICD-10-CM | POA: Diagnosis present

## 2020-09-28 DIAGNOSIS — Z20822 Contact with and (suspected) exposure to covid-19: Secondary | ICD-10-CM | POA: Diagnosis present

## 2020-09-28 DIAGNOSIS — E875 Hyperkalemia: Secondary | ICD-10-CM | POA: Diagnosis present

## 2020-09-28 DIAGNOSIS — Z79899 Other long term (current) drug therapy: Secondary | ICD-10-CM

## 2020-09-28 DIAGNOSIS — G629 Polyneuropathy, unspecified: Secondary | ICD-10-CM

## 2020-09-28 DIAGNOSIS — D6481 Anemia due to antineoplastic chemotherapy: Secondary | ICD-10-CM | POA: Diagnosis present

## 2020-09-28 DIAGNOSIS — Z8249 Family history of ischemic heart disease and other diseases of the circulatory system: Secondary | ICD-10-CM

## 2020-09-28 DIAGNOSIS — Z7901 Long term (current) use of anticoagulants: Secondary | ICD-10-CM | POA: Insufficient documentation

## 2020-09-28 DIAGNOSIS — D6959 Other secondary thrombocytopenia: Secondary | ICD-10-CM | POA: Diagnosis present

## 2020-09-28 DIAGNOSIS — N189 Chronic kidney disease, unspecified: Secondary | ICD-10-CM | POA: Diagnosis not present

## 2020-09-28 DIAGNOSIS — E1122 Type 2 diabetes mellitus with diabetic chronic kidney disease: Secondary | ICD-10-CM | POA: Diagnosis present

## 2020-09-28 DIAGNOSIS — R944 Abnormal results of kidney function studies: Secondary | ICD-10-CM | POA: Diagnosis not present

## 2020-09-28 DIAGNOSIS — J90 Pleural effusion, not elsewhere classified: Secondary | ICD-10-CM | POA: Diagnosis not present

## 2020-09-28 DIAGNOSIS — R21 Rash and other nonspecific skin eruption: Secondary | ICD-10-CM | POA: Diagnosis not present

## 2020-09-28 DIAGNOSIS — Z4901 Encounter for fitting and adjustment of extracorporeal dialysis catheter: Secondary | ICD-10-CM | POA: Diagnosis not present

## 2020-09-28 DIAGNOSIS — N19 Unspecified kidney failure: Secondary | ICD-10-CM | POA: Diagnosis not present

## 2020-09-28 DIAGNOSIS — R0602 Shortness of breath: Secondary | ICD-10-CM | POA: Diagnosis not present

## 2020-09-28 LAB — URINALYSIS, ROUTINE W REFLEX MICROSCOPIC
Bacteria, UA: NONE SEEN
Bilirubin Urine: NEGATIVE
Glucose, UA: 150 mg/dL — AB
Ketones, ur: NEGATIVE mg/dL
Leukocytes,Ua: NEGATIVE
Nitrite: NEGATIVE
Protein, ur: 30 mg/dL — AB
Specific Gravity, Urine: 1.011 (ref 1.005–1.030)
pH: 7 (ref 5.0–8.0)

## 2020-09-28 LAB — CBC WITH DIFFERENTIAL/PLATELET
Abs Immature Granulocytes: 0.04 10*3/uL (ref 0.00–0.07)
Basophils Absolute: 0 10*3/uL (ref 0.0–0.1)
Basophils Relative: 0 %
Eosinophils Absolute: 0 10*3/uL (ref 0.0–0.5)
Eosinophils Relative: 0 %
HCT: 22.1 % — ABNORMAL LOW (ref 39.0–52.0)
Hemoglobin: 7.1 g/dL — ABNORMAL LOW (ref 13.0–17.0)
Immature Granulocytes: 1 %
Lymphocytes Relative: 7 %
Lymphs Abs: 0.3 10*3/uL — ABNORMAL LOW (ref 0.7–4.0)
MCH: 34 pg (ref 26.0–34.0)
MCHC: 32.1 g/dL (ref 30.0–36.0)
MCV: 105.7 fL — ABNORMAL HIGH (ref 80.0–100.0)
Monocytes Absolute: 0.3 10*3/uL (ref 0.1–1.0)
Monocytes Relative: 8 %
Neutro Abs: 3 10*3/uL (ref 1.7–7.7)
Neutrophils Relative %: 84 %
Platelets: 104 10*3/uL — ABNORMAL LOW (ref 150–400)
RBC: 2.09 MIL/uL — ABNORMAL LOW (ref 4.22–5.81)
RDW: 15.5 % (ref 11.5–15.5)
WBC: 3.5 10*3/uL — ABNORMAL LOW (ref 4.0–10.5)
nRBC: 0 % (ref 0.0–0.2)

## 2020-09-28 LAB — COMPREHENSIVE METABOLIC PANEL
ALT: 31 U/L (ref 0–44)
ALT: 29 U/L (ref 0–44)
AST: 16 U/L (ref 15–41)
AST: 13 U/L — ABNORMAL LOW (ref 15–41)
Albumin: 3.5 g/dL (ref 3.5–5.0)
Albumin: 3.3 g/dL — ABNORMAL LOW (ref 3.5–5.0)
Alkaline Phosphatase: 68 U/L (ref 38–126)
Alkaline Phosphatase: 61 U/L (ref 38–126)
Anion gap: 13 (ref 5–15)
Anion gap: 11 (ref 5–15)
BUN: 86 mg/dL — ABNORMAL HIGH (ref 8–23)
BUN: 88 mg/dL — ABNORMAL HIGH (ref 8–23)
CO2: 19 mmol/L — ABNORMAL LOW (ref 22–32)
CO2: 19 mmol/L — ABNORMAL LOW (ref 22–32)
Calcium: 8.4 mg/dL — ABNORMAL LOW (ref 8.9–10.3)
Calcium: 8.3 mg/dL — ABNORMAL LOW (ref 8.9–10.3)
Chloride: 101 mmol/L (ref 98–111)
Chloride: 103 mmol/L (ref 98–111)
Creatinine, Ser: 9.42 mg/dL — ABNORMAL HIGH (ref 0.61–1.24)
Creatinine, Ser: 9.29 mg/dL — ABNORMAL HIGH (ref 0.61–1.24)
GFR, Estimated: 5 mL/min — ABNORMAL LOW (ref >60)
GFR, Estimated: 6 mL/min — ABNORMAL LOW (ref >60)
Glucose, Bld: 263 mg/dL — ABNORMAL HIGH (ref 70–99)
Glucose, Bld: 216 mg/dL — ABNORMAL HIGH (ref 70–99)
Potassium: 7.1 mmol/L (ref 3.5–5.1)
Potassium: 6.8 mmol/L (ref 3.5–5.1)
Sodium: 133 mmol/L — ABNORMAL LOW (ref 135–145)
Sodium: 133 mmol/L — ABNORMAL LOW (ref 135–145)
Total Bilirubin: 0.6 mg/dL (ref 0.3–1.2)
Total Bilirubin: 0.5 mg/dL (ref 0.3–1.2)
Total Protein: 6.1 g/dL — ABNORMAL LOW (ref 6.5–8.1)
Total Protein: 5.7 g/dL — ABNORMAL LOW (ref 6.5–8.1)

## 2020-09-28 LAB — TROPONIN I (HIGH SENSITIVITY)
Troponin I (High Sensitivity): 15 ng/L (ref <18)
Troponin I (High Sensitivity): 21 ng/L — ABNORMAL HIGH (ref <18)

## 2020-09-28 LAB — CBC
HCT: 20.5 % — ABNORMAL LOW (ref 39.0–52.0)
Hemoglobin: 6.7 g/dL — CL (ref 13.0–17.0)
MCH: 34 pg (ref 26.0–34.0)
MCHC: 32.7 g/dL (ref 30.0–36.0)
MCV: 104.1 fL — ABNORMAL HIGH (ref 80.0–100.0)
Platelets: 110 10*3/uL — ABNORMAL LOW (ref 150–400)
RBC: 1.97 MIL/uL — ABNORMAL LOW (ref 4.22–5.81)
RDW: 15.5 % (ref 11.5–15.5)
WBC: 4.3 10*3/uL (ref 4.0–10.5)
nRBC: 0 % (ref 0.0–0.2)

## 2020-09-28 LAB — PROTIME-INR
INR: 1.2 (ref 0.8–1.2)
Prothrombin Time: 15.1 seconds (ref 11.4–15.2)

## 2020-09-28 LAB — SAMPLE TO BLOOD BANK

## 2020-09-28 LAB — RESP PANEL BY RT-PCR (FLU A&B, COVID) ARPGX2
Influenza A by PCR: NEGATIVE
Influenza B by PCR: NEGATIVE
SARS Coronavirus 2 by RT PCR: NEGATIVE

## 2020-09-28 LAB — PREPARE RBC (CROSSMATCH)

## 2020-09-28 LAB — BRAIN NATRIURETIC PEPTIDE: B Natriuretic Peptide: 325 pg/mL — ABNORMAL HIGH (ref 0.0–100.0)

## 2020-09-28 LAB — LACTATE DEHYDROGENASE: LDH: 132 U/L (ref 98–192)

## 2020-09-28 MED ORDER — TRAZODONE HCL 100 MG PO TABS
100.0000 mg | ORAL_TABLET | Freq: Every day | ORAL | Status: DC
  Administered 2020-09-29 – 2020-10-04 (×7): 100 mg via ORAL
  Filled 2020-09-28 (×7): qty 1

## 2020-09-28 MED ORDER — DEXTROSE 50 % IV SOLN
1.0000 | Freq: Once | INTRAVENOUS | Status: AC
  Administered 2020-09-28: 50 mL via INTRAVENOUS
  Filled 2020-09-28: qty 50

## 2020-09-28 MED ORDER — SODIUM BICARBONATE 650 MG PO TABS
1300.0000 mg | ORAL_TABLET | Freq: Two times a day (BID) | ORAL | Status: DC
  Administered 2020-09-29 – 2020-10-05 (×14): 1300 mg via ORAL
  Filled 2020-09-28 (×14): qty 2

## 2020-09-28 MED ORDER — SODIUM CHLORIDE 0.9 % IV SOLN: INTRAVENOUS | Status: DC

## 2020-09-28 MED ORDER — INSULIN DETEMIR 100 UNIT/ML ~~LOC~~ SOLN
20.0000 [IU] | Freq: Every day | SUBCUTANEOUS | Status: DC
  Administered 2020-09-29: 20 [IU] via SUBCUTANEOUS
  Filled 2020-09-28 (×2): qty 0.2

## 2020-09-28 MED ORDER — INSULIN ASPART 100 UNIT/ML ~~LOC~~ SOLN
0.0000 [IU] | Freq: Three times a day (TID) | SUBCUTANEOUS | Status: DC
  Administered 2020-09-30: 2 [IU] via SUBCUTANEOUS
  Administered 2020-10-01: 3 [IU] via SUBCUTANEOUS
  Administered 2020-10-02 (×2): 2 [IU] via SUBCUTANEOUS
  Administered 2020-10-02: 5 [IU] via SUBCUTANEOUS
  Administered 2020-10-03 – 2020-10-04 (×2): 3 [IU] via SUBCUTANEOUS
  Administered 2020-10-05: 1 [IU] via SUBCUTANEOUS
  Administered 2020-10-05: 2 [IU] via SUBCUTANEOUS

## 2020-09-28 MED ORDER — INSULIN ASPART 100 UNIT/ML IV SOLN
5.0000 [IU] | Freq: Once | INTRAVENOUS | Status: AC
  Administered 2020-09-28: 5 [IU] via INTRAVENOUS

## 2020-09-28 MED ORDER — CALCIUM GLUCONATE-NACL 1-0.675 GM/50ML-% IV SOLN
1.0000 g | Freq: Once | INTRAVENOUS | Status: AC
  Administered 2020-09-28: 1000 mg via INTRAVENOUS
  Filled 2020-09-28: qty 50

## 2020-09-28 MED ORDER — HYDROCODONE-ACETAMINOPHEN 5-325 MG PO TABS: 1.0000 | ORAL_TABLET | Freq: Two times a day (BID) | ORAL | Status: DC | PRN

## 2020-09-28 MED ORDER — SODIUM ZIRCONIUM CYCLOSILICATE 10 G PO PACK
10.0000 g | PACK | Freq: Three times a day (TID) | ORAL | Status: DC
  Administered 2020-09-28 – 2020-09-30 (×5): 10 g via ORAL
  Filled 2020-09-28 (×2): qty 1
  Filled 2020-09-28: qty 2
  Filled 2020-09-28 (×2): qty 1

## 2020-09-28 MED ORDER — SODIUM BICARBONATE 8.4 % IV SOLN
INTRAVENOUS | Status: AC
  Administered 2020-09-28: 50 meq via INTRAVENOUS
  Filled 2020-09-28: qty 50

## 2020-09-28 MED ORDER — DIPHENOXYLATE-ATROPINE 2.5-0.025 MG PO TABS: 2.0000 | ORAL_TABLET | Freq: Four times a day (QID) | ORAL | Status: DC | PRN

## 2020-09-28 MED ORDER — DICYCLOMINE HCL 10 MG PO CAPS: 10.0000 mg | ORAL_CAPSULE | Freq: Two times a day (BID) | ORAL | Status: DC | PRN

## 2020-09-28 MED ORDER — ACETAMINOPHEN 325 MG PO TABS: 650.0000 mg | ORAL_TABLET | Freq: Four times a day (QID) | ORAL | Status: DC | PRN

## 2020-09-28 MED ORDER — SODIUM BICARBONATE 8.4 % IV SOLN: 50.0000 meq | Freq: Once | INTRAVENOUS | Status: AC

## 2020-09-28 MED ORDER — ALFUZOSIN HCL ER 10 MG PO TB24
10.0000 mg | ORAL_TABLET | Freq: Every day | ORAL | Status: DC
  Administered 2020-09-29 – 2020-10-03 (×5): 10 mg via ORAL
  Filled 2020-09-28 (×8): qty 1

## 2020-09-28 MED ORDER — PRAVASTATIN SODIUM 10 MG PO TABS
20.0000 mg | ORAL_TABLET | Freq: Every day | ORAL | Status: DC
  Administered 2020-09-29 – 2020-10-04 (×7): 20 mg via ORAL
  Filled 2020-09-28 (×7): qty 2

## 2020-09-28 MED ORDER — SODIUM CHLORIDE 0.9% IV SOLUTION: Freq: Once | INTRAVENOUS | Status: DC

## 2020-09-28 MED ORDER — HEPARIN SODIUM (PORCINE) 5000 UNIT/ML IJ SOLN
5000.0000 [IU] | Freq: Three times a day (TID) | INTRAMUSCULAR | Status: DC
  Administered 2020-09-28 – 2020-09-29 (×3): 5000 [IU] via SUBCUTANEOUS
  Filled 2020-09-28 (×3): qty 1

## 2020-09-28 NOTE — Progress Notes (Signed)
Notified that patient's potassium 7.1, creatinine 9.42.  Dr. Delton Coombes aware.  Orders received for patient to come to ER for evaluation and admission.  Patient notified and he will come back to ER at [facility].    Lamount Cranker, RN given report.

## 2020-09-28 NOTE — Progress Notes (Signed)
Patient called clinic and said that he has had increased shortness of breath on exertion. He states that he is unable to walk very far without giving "completely out of breath".  He has to stop and rest to catch his breath. Patient schedule for labs today to check his blood counts. Patient verbalizes understanding.

## 2020-09-28 NOTE — ED Triage Notes (Addendum)
Pt in a [facility] pt for Multiple Myeloma and was sent to the ED for a potassium of 7.1 and a creatinine of 9.4.

## 2020-09-28 NOTE — Telephone Encounter (Signed)
Oral Chemotherapy Pharmacist Encounter   Attempted to reach patient to provide update and offer for initial counseling on oral medication: Xpovio (selinexor).   No answer. Left voicemail for patient to call back to discuss details of medication acquisition and initial counseling session.  Leron Croak, PharmD, BCPS Hematology/Oncology Clinical Pharmacist [facility] [phone] 09/28/2020 9:56 AM

## 2020-09-28 NOTE — ED Notes (Signed)
Date and time results received: 09/28/20 1851 (use smartphrase ".now" to insert current time)  Test: HGB Critical Value: 6.7  Name of Provider Notified: Dr Sedonia Small  Orders Received? Or Actions Taken?: NA

## 2020-09-28 NOTE — Telephone Encounter (Signed)
Oral Chemotherapy Pharmacist Encounter  Patient Education I spoke with patient for overview of new oral chemotherapy medication: Xpovio (selinexor) for the treatment of IgG kappa multiple myeloma, planned duration until disease progression or unacceptable drug toxicity.   Pt is doing well. Counseled patient on administration, dosing, side effects, monitoring, drug-food interactions, safe handling, storage, and disposal. Patient will take Xpovio (selinexor) 40 mg tablets, 1 tablet by mouth once a week.  Patient will take compazine 30 minutes before taking Xpovio. Per MD Zofran will only be prescribed if Compazine is ineffective  Patient knows to avoid grapefruit and grapefruit juice while on therapy.   Xpovio start date: 10/04/20   Side effects include but not limited to: nausea/vomiting, decreased blood counts, decreased appetite, fatigue, diarrhea, changes in electrolytes, and dizziness/mental status changes  Reviewed with patient importance of keeping a medication schedule and plan for any missed doses.  After discussion with patient no patient barriers to medication adherence identified.   Medication is being shipped from [facility] to be delivered to MD office on 09/28/20. Patient made aware medication will be at MD office.   Mr. Buice voiced understanding and appreciation. All questions answered. Medication handout provided.  Provided patient with [facility] phone number. Patient knows to call the office with questions or concerns. Oral Chemotherapy Navigation [facility] will continue to follow.  Leron Croak, PharmD, BCPS Hematology/Oncology Clinical Pharmacist [facility] [phone] 09/28/2020 11:39 AM

## 2020-09-28 NOTE — H&P (Signed)
History and Physical    Tom Marshall IHW:[id number] DOB: [birth year] DOA: 09/28/2020  I have briefly reviewed the patient's prior medical records in [place]  PCP: Asencion Noble, MD  Patient coming from: home  Chief Complaint: Shortness of breath  HPI: Tom Marshall is a 69 y.o. male with medical history significant of renal cell cancer status post nephrectomy in 1998 currently with solitary kidney, CKD stage IV with baseline creatinine 2.5-2.7, diabetes mellitus, hypertension, hyperlipidemia, multiple myeloma undergoing active treatment with Dr. Delton Coombes with recent worsening of the free light chain ratios currently undergoing treatment switch presents to the hospital with complaints of shortness of breath and he was also called that he was in renal failure.  He has been feeling short of breath for the past couple of days, was seen in the cancer center for labs and was called to come to the ER due to renal failure as well as hyperkalemia.  Patient has been getting short of breath occasionally related to recurrent anemia improving after blood transfusion and he thought this was the case this time around.  He denies any chest pain.  He denies any abdominal pain, nausea or vomiting.  He denies any shortness of breath.  He has had no fever or chills.  He denies any blood in his stool or dark tarry stools.  ED Course: In the emergency room he is afebrile, normotensive, satting well on room air.  His blood work shows a potassium of 6.8, bicarb 19, BUN 88 and creatinine 9.29.  His BNP is 325, high-sensitivity troponin negative.  Platelets are 110 and her hemoglobin is 6.7.  Nephrology was consulted by EDP, Dr. Moshe Cipro, who recommended admission to [facility] stepdown for nephrology consultation.  Review of Systems: All systems reviewed, and apart from HPI, all negative  Past Medical History:  Diagnosis Date  . Cancer ([place])    multiple myeloma  . Cancer of right kidney ([place])   .  Cervical dystonia   . Diabetes mellitus without complication ([place])   . Gout   . Hypercholesteremia     Past Surgical History:  Procedure Laterality Date  . BONE MARROW BIOPSY    . CHOLECYSTECTOMY  2007  . COLONOSCOPY WITH PROPOFOL N/A 01/16/2018   Procedure: COLONOSCOPY WITH PROPOFOL;  Surgeon: Daneil Dolin, MD;  Location: [facility];  Service: Endoscopy;  Laterality: N/A;  1:45pm  . ESOPHAGOGASTRODUODENOSCOPY (EGD) WITH PROPOFOL N/A 01/16/2018   Procedure: ESOPHAGOGASTRODUODENOSCOPY (EGD) WITH PROPOFOL;  Surgeon: Daneil Dolin, MD;  Location: [facility];  Service: Endoscopy;  Laterality: N/A;  . GIVENS CAPSULE STUDY N/A 05/12/2018   Procedure: GIVENS CAPSULE STUDY;  Surgeon: Daneil Dolin, MD;  Location: [facility];  Service: Endoscopy;  Laterality: N/A;  7:30am  . NEPHRECTOMY Right 1998   cancer  . PORTACATH PLACEMENT Right 04/07/2020   Procedure: INSERTION PORT-A-CATH (attached catherter in right internal jugular);  Surgeon: Virl Cagey, MD;  Location: [facility] ORS;  Service: General;  Laterality: Right;     reports that he has never smoked. He quit smokeless tobacco use about 3 years ago.  His smokeless tobacco use included chew. He reports that he does not drink alcohol and does not use drugs.  No Known Allergies  Family History  Problem Relation Age of Onset  . Heart failure Mother 72  . Dementia Father   . Colon cancer Neg Hx     Prior to Admission medications   Medication Sig Start Date End  Date Taking? Authorizing Provider  acetaminophen (TYLENOL) 325 MG tablet Take 650 mg by mouth every 6 (six) hours as needed.   Yes [provider]  alfuzosin (UROXATRAL) 10 MG 24 hr tablet Take 1 tablet (10 mg total) by mouth daily with breakfast. 07/12/20  Yes Dahlstedt, Annie Main, MD  allopurinol (ZYLOPRIM) 300 MG tablet Take 300 mg by mouth every morning.   Yes [provider]  cyclobenzaprine (FLEXERIL) 10 MG tablet TAKE 1 TABLET THREE TIMES DAILY AS  NEEDED FOR MUSCLE SPASM Patient taking differently: Take 10 mg by mouth 3 (three) times daily as needed.  09/21/20  Yes Derek Jack, MD  DARATUMUMAB IV Inject into the vein. Weeks 1-8 weekly x 8 doses; then every 2 weeks x 8 doses; then every 28 days 03/22/20  Yes [provider]  dexamethasone (DECADRON) 4 MG tablet Take 5 tablets (20 mg total) by mouth once a week. 06/27/20  Yes Derek Jack, MD  dicyclomine (BENTYL) 10 MG capsule Take 10 mg by mouth 2 (two) times daily as needed.  04/12/20  Yes [provider]  diphenoxylate-atropine (LOMOTIL) 2.5-0.025 MG tablet Take 2 tablets by mouth 4 (four) times daily as needed.  07/12/20  Yes [provider]  ELIQUIS 2.5 MG TABS tablet TAKE 1 TABLET BY MOUTH TWICE DAILY AFTER COMPLETION OF THE STARTER PACK. Patient taking differently: Take 2.5 mg by mouth daily.  01/07/20  Yes Lockamy, Randi L, NP-C  furosemide (LASIX) 40 MG tablet Take 1.5 tablets (60 mg total) by mouth daily. 09/19/20  Yes Derek Jack, MD  gabapentin (NEURONTIN) 300 MG capsule Take 2 capsules (600 mg total) by mouth at bedtime. 07/25/20  Yes Derek Jack, MD  glipiZIDE (GLUCOTROL XL) 10 MG 24 hr tablet TAKE (1) TABLET BY MOUTH DAILY WITH BREAKFAST. Patient taking differently: Take 10 mg by mouth daily with breakfast.  09/14/20  Yes Derek Jack, MD  HYDROcodone-acetaminophen (NORCO/VICODIN) 5-325 MG tablet Take 1 tablet by mouth 2 (two) times daily as needed for severe pain. 07/25/20 07/25/21 Yes Derek Jack, MD  lisinopril (ZESTRIL) 5 MG tablet Take 5 mg by mouth daily. 08/22/20  Yes [provider]  Multiple Vitamins-Minerals (CENTRUM SILVER 50+MEN) TABS Take 1 tablet by mouth every morning.   Yes [provider]  NOVOLOG FLEXPEN 100 UNIT/ML FlexPen Inject 8 Units into the skin 3 (three) times daily with meals.  06/23/20  Yes [provider]  pravastatin (PRAVACHOL) 20 MG tablet Take 20 mg by  mouth at bedtime.    Yes [provider]  sodium bicarbonate 650 MG tablet Take 1 tablet (650 mg total) by mouth 2 (two) times daily. Patient taking differently: Take 1,300 mg by mouth 2 (two) times daily.  07/31/17  Yes Kathie Dike, MD  traZODone (DESYREL) 100 MG tablet Take 1 tablet (100 mg total) by mouth at bedtime. 05/30/20  Yes Derek Jack, MD  TRESIBA FLEXTOUCH 200 UNIT/ML SOPN Inject 50 Units into the skin daily.  11/12/18  Yes [provider]  B-D UF III MINI PEN NEEDLES 31G X 5 MM MISC  09/02/18   [provider]  selinexor (XPOVIO) 40 MG TBPK Take 40 mg by mouth once a week. 09/20/20   Derek Jack, MD  TRUE METRIX BLOOD GLUCOSE TEST test strip  07/01/19   [provider]  prochlorperazine (COMPAZINE) 10 MG tablet Take 1 tablet (10 mg total) by mouth every 6 (six) hours as needed (Nausea or vomiting). 03/17/20 09/26/20  Derek Jack, MD  Physical Exam: Vitals:   09/28/20 1736 09/28/20 1830 09/28/20 1900 09/28/20 1930  BP:  (!) 119/56 (!) 103/54 (!) 131/58  Pulse:  70 73 100  Resp:  (!) 23 18 (!) 29  Temp:      TempSrc:      SpO2:  95% 96% 94%  Weight: 106.6 kg     Height: _0  (1.727 m)       Constitutional: NAD, calm, comfortable Eyes: PERRL, lids and conjunctivae normal ENMT: Mucous membranes are moist.  Neck: normal, supple Respiratory: clear to auscultation bilaterally, no wheezing, no crackles. Normal respiratory effort.  Cardiovascular: Regular rate and rhythm, no murmurs / rubs / gallops.  1+ edema Abdomen: Obese abdomen, no tenderness, no masses palpated. Bowel sounds positive.  Musculoskeletal: no clubbing / cyanosis. Normal muscle tone.  Skin: no rashes, lesions, ulcers. No induration Neurologic: CN 2-12 grossly intact. Strength 5/5 in all 4.  Psychiatric: Normal judgment and insight. Alert and oriented x 3. Normal mood.   Labs on Admission: I have personally reviewed following labs and imaging  studies  CBC: Recent Labs  Lab 09/28/20 1458 09/28/20 1824  WBC 3.5* 4.3  NEUTROABS 3.0  --   HGB 7.1* 6.7*  HCT 22.1* 20.5*  MCV 105.7* 104.1*  PLT 104* 546*   Basic Metabolic Panel: Recent Labs  Lab 09/28/20 1458 09/28/20 1824  NA 133* 133*  K 7.1* 6.8*  CL 101 103  CO2 19* 19*  GLUCOSE 263* 216*  BUN 86* 88*  CREATININE 9.42* 9.29*  CALCIUM 8.4* 8.3*   Liver Function Tests: Recent Labs  Lab 09/28/20 1458 09/28/20 1824  AST 16 13*  ALT 31 29  ALKPHOS 68 61  BILITOT 0.6 0.5  PROT 6.1* 5.7*  ALBUMIN 3.5 3.3*   Coagulation Profile: Recent Labs  Lab 09/28/20 1824  INR 1.2   BNP (last 3 results) No results for input(s): PROBNP in the last 8760 hours. CBG: No results for input(s): GLUCAP in the last 168 hours. Thyroid Function Tests: No results for input(s): TSH, T4TOTAL, FREET4, T3FREE, THYROIDAB in the last 72 hours. Urine analysis:    Component Value Date/Time   COLORURINE YELLOW 04/19/2020 1418   APPEARANCEUR Clear 07/12/2020 0938   LABSPEC 1.009 04/19/2020 1418   PHURINE 5.0 04/19/2020 1418   GLUCOSEU 2+ (A) 07/12/2020 0938   HGBUR SMALL (A) 04/19/2020 1418   BILIRUBINUR Negative 07/12/2020 0938   KETONESUR NEGATIVE 04/19/2020 1418   PROTEINUR 2+ (A) 07/12/2020 0938   PROTEINUR 30 (A) 04/19/2020 1418   NITRITE Negative 07/12/2020 0938   NITRITE NEGATIVE 04/19/2020 1418   LEUKOCYTESUR Negative 07/12/2020 0938   LEUKOCYTESUR NEGATIVE 04/19/2020 1418     Radiological Exams on Admission: DG Chest Port 1 View  Result Date: 09/28/2020 CLINICAL DATA:  69 year old male with shortness of breath. EXAM: PORTABLE CHEST 1 VIEW COMPARISON:  Chest radiograph dated 05/18/2020. FINDINGS: Right-sided Port-A-Cath in similar position. There is diffuse interstitial coarsening and mild chronic bronchitic changes. There are bibasilar atelectasis/scarring. No focal consolidation, pleural effusion, pneumothorax. Stable cardiac silhouette. No acute osseous pathology.  Old rib fractures. IMPRESSION: No acute cardiopulmonary process. Electronically Signed   By: Anner Crete M.D.   On: 09/28/2020 18:30    EKG: Independently reviewed.  Sinus rhythm, no specific hyperkalemic findings  Assessment/Plan  Principal Problem Acute kidney injury on chronic kidney disease stage IV with hyperkalemia -Baseline creatinine around 2.4, currently at 9.  On-call nephrology, Dr. Moshe Cipro recommended admission to [facility] and will consult nephrology on  arrival.  Admit to stepdown -EKG does not show changes of hyperkalemia, patient was given dextrose and insulin, calcium, as well as has been placed on scheduled Lokelma -Is possible that this is a myeloma kidney given recent concern as an outpatient at the street light chains were increasing.  Obtain light chains, LDH, SPEP, may need plasmapheresis based on results  Active Problems Type 2 diabetes mellitus -Resume long-acting insulin along with sliding scale, reduced doses due to renal failure  Multiple myeloma -Case was discussed over the phone with Dr. Delton Coombes who recommended blood work as above  Essential hypertension -Hold lisinopril, Lasix for now  Hyperlipidemia -continue statin  Symptomatic anemia, thrombocytopenia -Likely in the setting of underlying malignancy, transfuse unit of packed red blood cells  History of DVT -Patient tells me he had a DVT several years ago, has been on Eliquis but only takes it 2.5 mg once daily.  Unclear why.  Given that he may need procedures in the next day or so, acute worsening of his renal function hold in place on heparin s.q   DVT prophylaxis: heparin  Code Status: Full code (of note, patient was DNR in 2018 but currently tells me he has no recollection of that and wishes to be full code) Family Communication: No family at bedside Disposition Plan: Likely home when ready Bed Type: [place] Consults called: Nephrology, Dr. Moshe Cipro Obs/Inp:  Inpatient  At the time of admission, it appears that the appropriate admission status for this patient is INPATIENT as it is expected that patient will require hospital care > 2 midnights. This is judged to be reasonable and necessary in order to provide the required intensity of service to ensure the patient's safety given: presenting symptoms, initial radiographic and laboratory data and in the context of their chronic comorbidities. Together, these circumstances are felt to place patient at high at high risk for further clinical deterioration threatening life, limb, or organ.  Marzetta Board, MD, PhD [facility]  Contact via [email or web address]  09/28/2020, 8:00 PM

## 2020-09-28 NOTE — Plan of Care (Signed)

## 2020-09-28 NOTE — Consult Note (Signed)
[facility] Renal Consultation Note  Requesting MD: Renne Crigler Indication for Consultation: AKI with hyperkalemia  HPI:  Tom Marshall is a 69 y.o. male with past medical history significant for diabetes mellitus, hypertension, hyperlipidemia, history of renal cell carcinoma status post nephrectomy in 1998.  He also has multiple myeloma diagnosed in 2018-has undergone treatments to include Revlimid, Pomalyst and Darzalex-most recently given on 09/05/2020.  Due to start regimen of Velcade and selinexor per Dr. Delton Coombes at the [facility].  Patient presented today to the hospital with shortness of breath.  He was found to have a creatinine of 9.4 increased from 2.78  9 days ago.  He also was noted to have hyperkalemia with potassium of 7.1.  ACE inhibitor was on his medication list.  Labs also notable for hemoglobin of 6.7.  Per ER physician EKG negative for abnormality, he is hemodynamically stable and alert.   Medical treatment for the hyperkalemia has been initiated and I suggested transfer to [facility] in case urgent dialysis is needed for potassium  This AM pt seen-  He denies any change in urination.  The only thing he noted was fatigue and shortness of breath that he felt was due to anemia.  He denies appetite disturbance.  He has seen Dr. Theador Hawthorne in the past but not for around 6 months.  He confirms that he was taking ACE inhibitor.  He denies NSAIDs or any other new medicines since the change in kidney function  Creatinine, Ser  Date/Time Value Ref Range Status  09/28/2020 06:24 PM 9.29 (H) 0.61 - 1.24 mg/dL Final  09/28/2020 02:58 PM 9.42 (H) 0.61 - 1.24 mg/dL Final  09/19/2020 09:25 AM 2.78 (H) 0.61 - 1.24 mg/dL Final  09/05/2020 08:57 AM 2.59 (H) 0.61 - 1.24 mg/dL Final  08/22/2020 08:36 AM 2.55 (H) 0.61 - 1.24 mg/dL Final  08/08/2020 08:43 AM 2.74 (H) 0.61 - 1.24 mg/dL Final  07/25/2020 09:11 AM 2.45 (H) 0.61 - 1.24 mg/dL Final  07/11/2020 09:13 AM  2.43 (H) 0.61 - 1.24 mg/dL Final  06/27/2020 08:59 AM 2.62 (H) 0.61 - 1.24 mg/dL Final  06/13/2020 09:08 AM 2.46 (H) 0.61 - 1.24 mg/dL Final  06/06/2020 08:03 AM 2.29 (H) 0.61 - 1.24 mg/dL Final  05/30/2020 08:06 AM 2.35 (H) 0.61 - 1.24 mg/dL Final  05/23/2020 08:34 AM 2.32 (H) 0.61 - 1.24 mg/dL Final  05/16/2020 10:22 AM 2.17 (H) 0.61 - 1.24 mg/dL Final  05/09/2020 08:24 AM 2.60 (H) 0.61 - 1.24 mg/dL Final  05/03/2020 08:20 AM 2.79 (H) 0.61 - 1.24 mg/dL Final  04/25/2020 08:55 AM 2.55 (H) 0.61 - 1.24 mg/dL Final  04/18/2020 09:38 AM 2.90 (H) 0.61 - 1.24 mg/dL Final  04/11/2020 09:12 AM 2.81 (H) 0.61 - 1.24 mg/dL Final  04/06/2020 09:07 AM 3.02 (H) 0.61 - 1.24 mg/dL Final  03/15/2020 11:30 AM 3.72 (H) 0.61 - 1.24 mg/dL Final  03/08/2020 12:30 PM 3.44 (H) 0.61 - 1.24 mg/dL Final  02/09/2020 01:34 PM 3.68 (H) 0.61 - 1.24 mg/dL Final  12/07/2019 10:47 AM 2.90 (H) 0.61 - 1.24 mg/dL Final  09/02/2019 10:40 AM 3.18 (H) 0.61 - 1.24 mg/dL Final  05/27/2019 09:28 AM 3.12 (H) 0.61 - 1.24 mg/dL Final  03/24/2019 11:15 AM 3.05 (H) 0.61 - 1.24 mg/dL Final  01/13/2019 12:25 PM 2.91 (H) 0.61 - 1.24 mg/dL Final  11/18/2018 10:35 AM 2.94 (H) 0.61 - 1.24 mg/dL Final  10/21/2018 09:22 AM 3.15 (H) 0.61 - 1.24 mg/dL Final  09/30/2018 01:16  PM 2.99 (H) 0.61 - 1.24 mg/dL Final  07/30/2018 11:54 AM 2.91 (H) 0.61 - 1.24 mg/dL Final  07/01/2018 11:32 AM 3.32 (H) 0.61 - 1.24 mg/dL Final  05/29/2018 11:51 AM 2.97 (H) 0.61 - 1.24 mg/dL Final  04/16/2018 11:37 AM 2.82 (H) 0.61 - 1.24 mg/dL Final  03/11/2018 12:51 PM 2.73 (H) 0.61 - 1.24 mg/dL Final  02/27/2018 09:10 AM 2.65 (H) 0.61 - 1.24 mg/dL Final  02/18/2018 12:34 PM 2.73 (H) 0.61 - 1.24 mg/dL Final  02/11/2018 11:20 AM 2.63 (H) 0.61 - 1.24 mg/dL Final  02/04/2018 11:05 AM 2.68 (H) 0.61 - 1.24 mg/dL Final  01/28/2018 10:43 AM 2.70 (H) 0.61 - 1.24 mg/dL Final  01/21/2018 10:46 AM 2.72 (H) 0.61 - 1.24 mg/dL Final  01/07/2018 10:25 AM 3.13 (H) 0.61 - 1.24  mg/dL Final  12/31/2017 10:32 AM 2.80 (H) 0.61 - 1.24 mg/dL Final  12/31/2017 10:32 AM 2.77 (H) 0.61 - 1.24 mg/dL Final  12/17/2017 01:43 PM 2.94 (H) 0.61 - 1.24 mg/dL Final  12/10/2017 09:40 AM 2.69 (H) 0.61 - 1.24 mg/dL Final  12/03/2017 12:44 PM 2.93 (H) 0.61 - 1.24 mg/dL Final  11/26/2017 08:41 AM 3.07 (H) 0.61 - 1.24 mg/dL Final  11/18/2017 09:35 AM 2.86 (H) 0.61 - 1.24 mg/dL Final  11/11/2017 10:19 AM 2.78 (H) 0.61 - 1.24 mg/dL Final  11/07/2017 10:10 AM 3.03 (H) 0.61 - 1.24 mg/dL Final     PMHx:   Past Medical History:  Diagnosis Date  . Cancer ([place])    multiple myeloma  . Cancer of right kidney ([place])   . Cervical dystonia   . Diabetes mellitus without complication ([place])   . Gout   . Hypercholesteremia     Past Surgical History:  Procedure Laterality Date  . BONE MARROW BIOPSY    . CHOLECYSTECTOMY  2007  . COLONOSCOPY WITH PROPOFOL N/A 01/16/2018   Procedure: COLONOSCOPY WITH PROPOFOL;  Surgeon: Daneil Dolin, MD;  Location: [facility];  Service: Endoscopy;  Laterality: N/A;  1:45pm  . ESOPHAGOGASTRODUODENOSCOPY (EGD) WITH PROPOFOL N/A 01/16/2018   Procedure: ESOPHAGOGASTRODUODENOSCOPY (EGD) WITH PROPOFOL;  Surgeon: Daneil Dolin, MD;  Location: [facility];  Service: Endoscopy;  Laterality: N/A;  . GIVENS CAPSULE STUDY N/A 05/12/2018   Procedure: GIVENS CAPSULE STUDY;  Surgeon: Daneil Dolin, MD;  Location: [facility];  Service: Endoscopy;  Laterality: N/A;  7:30am  . NEPHRECTOMY Right 1998   cancer  . PORTACATH PLACEMENT Right 04/07/2020   Procedure: INSERTION PORT-A-CATH (attached catherter in right internal jugular);  Surgeon: Virl Cagey, MD;  Location: [facility] ORS;  Service: General;  Laterality: Right;    Family Hx:  Family History  Problem Relation Age of Onset  . Heart failure Mother 68  . Dementia Father   . Colon cancer Neg Hx     Social History:  reports that he has never smoked. He quit smokeless tobacco use about 3 years ago.  His  smokeless tobacco use included chew. He reports that he does not drink alcohol and does not use drugs.  Allergies: No Known Allergies  Medications: Prior to Admission medications   Medication Sig Start Date End Date Taking? Authorizing Provider  acetaminophen (TYLENOL) 325 MG tablet Take 650 mg by mouth every 6 (six) hours as needed.   Yes [provider]  alfuzosin (UROXATRAL) 10 MG 24 hr tablet Take 1 tablet (10 mg total) by mouth daily with breakfast. 07/12/20  Yes Franchot Gallo, MD  allopurinol (ZYLOPRIM) 300 MG tablet  Take 300 mg by mouth every morning.   Yes [provider]  cyclobenzaprine (FLEXERIL) 10 MG tablet TAKE 1 TABLET THREE TIMES DAILY AS NEEDED FOR MUSCLE SPASM Patient taking differently: Take 10 mg by mouth 3 (three) times daily as needed.  09/21/20  Yes Derek Jack, MD  DARATUMUMAB IV Inject into the vein. Weeks 1-8 weekly x 8 doses; then every 2 weeks x 8 doses; then every 28 days 03/22/20  Yes [provider]  dexamethasone (DECADRON) 4 MG tablet Take 5 tablets (20 mg total) by mouth once a week. 06/27/20  Yes Derek Jack, MD  dicyclomine (BENTYL) 10 MG capsule Take 10 mg by mouth 2 (two) times daily as needed.  04/12/20  Yes [provider]  diphenoxylate-atropine (LOMOTIL) 2.5-0.025 MG tablet Take 2 tablets by mouth 4 (four) times daily as needed.  07/12/20  Yes [provider]  ELIQUIS 2.5 MG TABS tablet TAKE 1 TABLET BY MOUTH TWICE DAILY AFTER COMPLETION OF THE STARTER PACK. Patient taking differently: Take 2.5 mg by mouth daily.  01/07/20  Yes Lockamy, Randi L, NP-C  furosemide (LASIX) 40 MG tablet Take 1.5 tablets (60 mg total) by mouth daily. 09/19/20  Yes Derek Jack, MD  gabapentin (NEURONTIN) 300 MG capsule Take 2 capsules (600 mg total) by mouth at bedtime. 07/25/20  Yes Derek Jack, MD  glipiZIDE (GLUCOTROL XL) 10 MG 24 hr tablet TAKE (1) TABLET BY MOUTH DAILY WITH BREAKFAST. Patient  taking differently: Take 10 mg by mouth daily with breakfast.  09/14/20  Yes Derek Jack, MD  HYDROcodone-acetaminophen (NORCO/VICODIN) 5-325 MG tablet Take 1 tablet by mouth 2 (two) times daily as needed for severe pain. 07/25/20 07/25/21 Yes Derek Jack, MD  lisinopril (ZESTRIL) 5 MG tablet Take 5 mg by mouth daily. 08/22/20  Yes [provider]  Multiple Vitamins-Minerals (CENTRUM SILVER 50+MEN) TABS Take 1 tablet by mouth every morning.   Yes [provider]  NOVOLOG FLEXPEN 100 UNIT/ML FlexPen Inject 8 Units into the skin 3 (three) times daily with meals.  06/23/20  Yes [provider]  pravastatin (PRAVACHOL) 20 MG tablet Take 20 mg by mouth at bedtime.    Yes [provider]  sodium bicarbonate 650 MG tablet Take 1 tablet (650 mg total) by mouth 2 (two) times daily. Patient taking differently: Take 1,300 mg by mouth 2 (two) times daily.  07/31/17  Yes Kathie Dike, MD  traZODone (DESYREL) 100 MG tablet Take 1 tablet (100 mg total) by mouth at bedtime. 05/30/20  Yes Derek Jack, MD  TRESIBA FLEXTOUCH 200 UNIT/ML SOPN Inject 50 Units into the skin daily.  11/12/18  Yes [provider]  B-D UF III MINI PEN NEEDLES 31G X 5 MM MISC  09/02/18   [provider]  selinexor (XPOVIO) 40 MG TBPK Take 40 mg by mouth once a week. 09/20/20   Derek Jack, MD  TRUE METRIX BLOOD GLUCOSE TEST test strip  07/01/19   [provider]  prochlorperazine (COMPAZINE) 10 MG tablet Take 1 tablet (10 mg total) by mouth every 6 (six) hours as needed (Nausea or vomiting). 03/17/20 09/26/20  Derek Jack, MD    I have reviewed the patient's current medications.  Labs:  Results for orders placed or performed during the hospital encounter of 09/28/20 (from the past 48 hour(s))  CBC     Status: Abnormal   Collection Time: 09/28/20  6:24 PM  Result Value Ref Range   WBC 4.3 4.0 - 10.5 K/uL   RBC  1.97 (L) 4.22 - 5.81 MIL/uL    Hemoglobin 6.7 (LL) 13.0 - 17.0 g/dL    Comment: REPEATED TO VERIFY THIS CRITICAL RESULT HAS VERIFIED AND BEEN CALLED TO CRAWFORD,H BY JAMIE WOODIE ON 12 01 2021 AT 1849, AND HAS BEEN READ BACK.     HCT 20.5 (L) 39 - 52 %   MCV 104.1 (H) 80.0 - 100.0 fL   MCH 34.0 26.0 - 34.0 pg   MCHC 32.7 30.0 - 36.0 g/dL   RDW 15.5 11.5 - 15.5 %   Platelets 110 (L) 150 - 400 K/uL    Comment: SPECIMEN CHECKED FOR CLOTS   nRBC 0.0 0.0 - 0.2 %    Comment: Performed at [facility], [address] [place], [place] [address]  Comprehensive metabolic panel     Status: Abnormal   Collection Time: 09/28/20  6:24 PM  Result Value Ref Range   Sodium 133 (L) 135 - 145 mmol/L   Potassium 6.8 (HH) 3.5 - 5.1 mmol/L    Comment: CRITICAL RESULT CALLED TO, READ BACK BY AND VERIFIED WITH: EASTER,T ON 09/28/20 AT 1935 BY LOY,C    Chloride 103 98 - 111 mmol/L   CO2 19 (L) 22 - 32 mmol/L   Glucose, Bld 216 (H) 70 - 99 mg/dL    Comment: Glucose reference range applies only to samples taken after fasting for at least 8 hours.   BUN 88 (H) 8 - 23 mg/dL   Creatinine, Ser 9.29 (H) 0.61 - 1.24 mg/dL   Calcium 8.3 (L) 8.9 - 10.3 mg/dL   Total Protein 5.7 (L) 6.5 - 8.1 g/dL   Albumin 3.3 (L) 3.5 - 5.0 g/dL   AST 13 (L) 15 - 41 U/L   ALT 29 0 - 44 U/L   Alkaline Phosphatase 61 38 - 126 U/L   Total Bilirubin 0.5 0.3 - 1.2 mg/dL   GFR, Estimated 6 (L) >60 mL/min    Comment: (NOTE) Calculated using the CKD-EPI Creatinine Equation (2021)    Anion gap 11 5 - 15    Comment: Performed at [facility], [address] [place], [place] [address]  Protime-INR     Status: None   Collection Time: 09/28/20  6:24 PM  Result Value Ref Range   Prothrombin Time 15.1 11.4 - 15.2 seconds   INR 1.2 0.8 - 1.2    Comment: (NOTE) INR goal varies based on device and disease states. Performed at [facility], [address], [place], [place] [address]   Brain natriuretic peptide     Status: Abnormal   Collection Time: 09/28/20  6:24  PM  Result Value Ref Range   B Natriuretic Peptide 325.0 (H) 0.0 - 100.0 pg/mL    Comment: Performed at [facility], [address] [place], [place] [address]  Troponin I (High Sensitivity)     Status: None   Collection Time: 09/28/20  6:24 PM  Result Value Ref Range   Troponin I (High Sensitivity) 15 <18 ng/L    Comment: (NOTE) Elevated high sensitivity troponin I (hsTnI) values and significant  changes across serial measurements may suggest ACS but many other  chronic and acute conditions are known to elevate hsTnI results.  Refer to the Links section for chest pain algorithms and additional  guidance. Performed at [facility], [address], [place], [place] [address]   Resp Panel by RT-PCR (Flu A&B, Covid) Nasopharyngeal Swab     Status: None   Collection Time: 09/28/20  6:35 PM  Specimen: Nasopharyngeal Swab; Nasopharyngeal(NP) swabs in vial transport medium  Result Value Ref Range   SARS Coronavirus 2 by RT PCR NEGATIVE NEGATIVE    Comment: (NOTE) SARS-CoV-2 target nucleic acids are NOT DETECTED.  The SARS-CoV-2 RNA is generally detectable in upper respiratory specimens during the acute phase of infection. The lowest concentration of SARS-CoV-2 viral copies this assay can detect is 138 copies/mL. A negative result does not preclude SARS-Cov-2 infection and should not be used as the sole basis for treatment or other patient management decisions. A negative result may occur with  improper specimen collection/handling, submission of specimen other than nasopharyngeal swab, presence of viral mutation(s) within the areas targeted by this assay, and inadequate number of viral copies(<138 copies/mL). A negative result must be combined with clinical observations, patient history, and epidemiological information. The expected result is Negative.  Fact Sheet for Patients:  EntrepreneurPulse.com.au  Fact Sheet for Healthcare Providers:   IncredibleEmployment.be  This test is no t yet approved or cleared by the [place] FDA and  has been authorized for detection and/or diagnosis of SARS-CoV-2 by FDA under an Emergency Use Authorization (EUA). This EUA will remain  in effect (meaning this test can be used) for the duration of the COVID-19 declaration under Section 564(b)(1) of the Act, 21 U.S.C.section 360bbb-3(b)(1), unless the authorization is terminated  or revoked sooner.       Influenza A by PCR NEGATIVE NEGATIVE   Influenza B by PCR NEGATIVE NEGATIVE    Comment: (NOTE) The Xpert Xpress SARS-CoV-2/FLU/RSV plus assay is intended as an aid in the diagnosis of influenza from Nasopharyngeal swab specimens and should not be used as a sole basis for treatment. Nasal washings and aspirates are unacceptable for Xpert Xpress SARS-CoV-2/FLU/RSV testing.  Fact Sheet for Patients: EntrepreneurPulse.com.au  Fact Sheet for Healthcare Providers: IncredibleEmployment.be  This test is not yet approved or cleared by the [place] FDA and has been authorized for detection and/or diagnosis of SARS-CoV-2 by FDA under an Emergency Use Authorization (EUA). This EUA will remain in effect (meaning this test can be used) for the duration of the COVID-19 declaration under Section 564(b)(1) of the Act, 21 U.S.C. section 360bbb-3(b)(1), unless the authorization is terminated or revoked.  Performed at [facility], [address], [place], [place] [address]   Type and screen [facility]     Status: None (Preliminary result)   Collection Time: 09/28/20  7:51 PM  Result Value Ref Range   ABO/RH(D) A POS    Antibody Screen PENDING    Sample Expiration      10/01/2020,2359 Performed at [facility], [address] [place], [place] [address]   Prepare RBC (crossmatch)     Status: None   Collection Time: 09/28/20  8:00 PM  Result Value Ref Range   Order  Confirmation      ORDER PROCESSED BY BLOOD BANK Performed at [facility], [address], [place], [place] [address]      ROS:  A comprehensive review of systems was negative except for: Constitutional: positive for fatigue Cardiovascular: positive for lower extremity edema  Physical Exam: Vitals:   09/28/20 2000 09/28/20 2030  BP: (!) 126/96   Pulse:  62  Resp: (!) 21 18  Temp:    SpO2:  96%     General: Pleasant, pale, obese white male alert and in no acute distress HEENT: Pupils are equal round reactive to light, extraocular motions are intact, mucous membranes are moist Neck: No JVD appreciated Heart:  Regular rate and rhythm Lungs: Decreased breath sounds to extreme bases Abdomen: Obese, slightly distended, nontender Extremities: Pitting edema bilaterally Skin: Warm and dry Neuro: Alert, nonfocal  Assessment/Plan: 69 year old white male with diabetes mellitus, hypertension, long history of solitary kidney with baseline creatinine in the mid twos.  He now presents with acute on chronic renal failure in the setting of receiving active therapy for multiple myeloma and being on an ACE inhibitor 1.Renal-acute on chronic renal failure happening over a time period of 9 days in the setting of active chemotherapy for multiple myeloma.  Possibilities include ATN-he could possibly have bladder outlet obstruction or cast nephropathy from myeloma.  Would think less likely some type of GN.   urinalysis pretty bland and renal ultrasound does not show obstruction to his solitary kidney.  There are no absolute indications for dialysis but hyperkalemia is concerning.  I had started some fluids overnight for possible cast nephropathy.  Now that I see he has edema I will discontinue them and give him some Lasix.  I have told him worst case scenario would be needing to initiate and continue chronic dialysis therapy.  Obviously, that is not when anyone wants but he understands 2.  Hyperkalemia-due to  #1.  Has been on ACE inhibitor therapy.  This has been held.  Will attempt to treat potassium medically-has been given calcium, insulin, bicarb and also placed on scheduled Lokelma.  Serial labs to ensure potassium improving.  If does not come down with medical therapy or develops other complications of AKI might need urgent dialysis.  Potassium down to 6.3 this morning 3. Hypertension/volume  -is hemodynamically stable, not hypertensive.  Oxygen saturation is good on room air.  challenged with some IV fluids in the short-term but now will stop in the setting of volume overload.  He is on Lasix as an outpatient, will give here 4. Anemia  -likely related to chemotherapy.  Not helping his symptomatology.  Transfusion of 1 unit has been ordered 5.  Multiple myeloma-is seeming refractory.  Could certainly be the etiology for his acute on chronic renal failure.  Suggest getting oncology involved   Louis Meckel 09/28/2020, 9:02 PM

## 2020-09-28 NOTE — ED Provider Notes (Signed)
[facility] Emergency Department Provider Note MRN:  [id number]  Arrival date & time: 09/28/20     Chief Complaint   Shortness of Breath   History of Present Illness   Tom Marshall is a 69 y.o. year-old male with a history of multiple myeloma, renal cell carcinoma status post nephrectomy presenting to the ED with chief complaint of shortness of breath.  Progressive gradual onset shortness of breath over the past for 5 days.  Labs performed at oncology office today reveal worsening kidney function and high potassium, sent here for evaluation.  Denies chest pain, no fainting spells, no other complaints.  No fever.  Review of Systems  A complete 10 system review of systems was obtained and all systems are negative except as noted in the HPI and PMH.   Patient's Health History    Past Medical History:  Diagnosis Date  . Cancer ([place])    multiple myeloma  . Cancer of right kidney ([place])   . Cervical dystonia   . Diabetes mellitus without complication ([place])   . Gout   . Hypercholesteremia     Past Surgical History:  Procedure Laterality Date  . BONE MARROW BIOPSY    . CHOLECYSTECTOMY  2007  . COLONOSCOPY WITH PROPOFOL N/A 01/16/2018   Procedure: COLONOSCOPY WITH PROPOFOL;  Surgeon: Daneil Dolin, MD;  Location: [facility] ENDO SU[facility];  Service: Endoscopy;  Laterality: N/A;  1:45pm  . ESOPHAGOGASTRODUODENOSCOPY (EGD) WITH PROPOFOL N/A 01/16/2018   Procedure: ESOPHAGOGASTRODUODENOSCOPY (EGD) WITH PROPOFOL;  Surgeon: Daneil Dolin, MD;  Location: [facility];  Service: Endoscopy;  Laterality: N/A;  . GIVENS CAPSULE STUDY N/A 05/12/2018   Procedure: GIVENS CAPSULE STUDY;  Surgeon: Daneil Dolin, MD;  Location: [facility];  Service: Endoscopy;  Laterality: N/A;  7:30am  . NEPHRECTOMY Right 1998   cancer  . PORTACATH PLACEMENT Right 04/07/2020   Procedure: INSERTION PORT-A-CATH (attached catherter in right internal jugular);  Surgeon: Virl Cagey, MD;   Location: [facility] ORS;  Service: General;  Laterality: Right;    Family History  Problem Relation Age of Onset  . Heart failure Mother 4  . Dementia Father   . Colon cancer Neg Hx     Social History   Socioeconomic History  . Marital status: Single    Spouse name: Not on file  . Number of children: Not on file  . Years of education: Not on file  . Highest education level: Not on file  Occupational History  . Occupation: Marine scientist, Insurance underwriter  Tobacco Use  . Smoking status: Never Smoker  . Smokeless tobacco: Former Systems developer    Types: Secondary school teacher  . Vaping Use: Never used  Substance and Sexual Activity  . Alcohol use: No  . Drug use: No  . Sexual activity: Not Currently  Other Topics Concern  . Not on file  Social History Narrative  . Not on file   Social Determinants of Health   Financial Resource Strain: Low Risk   . Difficulty of Paying Living Expenses: Not hard at all  Food Insecurity: No Food Insecurity  . Worried About Charity fundraiser in the Last Year: Never true  . Ran Out of Food in the Last Year: Never true  Transportation Needs: No Transportation Needs  . Lack of Transportation (Medical): No  . Lack of Transportation (Non-Medical): No  Physical Activity: Inactive  . Days of Exercise per Week: 0 days  . Minutes of Exercise per Session:  0 min  Stress: No Stress Concern Present  . Feeling of Stress : Not at all  Social Connections: Moderately Integrated  . Frequency of Communication with Friends and Family: More than three times a week  . Frequency of Social Gatherings with Friends and Family: More than three times a week  . Attends Religious Services: More than 4 times per year  . Active Member of Clubs or Organizations: Yes  . Attends Archivist Meetings: More than 4 times per year  . Marital Status: Never married  Intimate Partner Violence: Not At Risk  . Fear of Current or Ex-Partner: No  . Emotionally Abused: No  . Physically  Abused: No  . Sexually Abused: No     Physical Exam   Vitals:   09/28/20 2030 09/28/20 2100  BP:  (!) 118/57  Pulse: 62 61  Resp: 18 16  Temp:    SpO2: 96% 96%    CONSTITUTIONAL: Well-appearing, NAD NEURO:  Alert and oriented x 3, no focal deficits EYES:  eyes equal and reactive ENT/NECK:  no LAD, no JVD CARDIO: Regular rate, well-perfused, normal S1 and S2 PULM:  CTAB no wheezing or rhonchi GI/GU:  normal bowel sounds, non-distended, non-tender MSK/SPINE:  No gross deformities, no edema SKIN:  no rash, atraumatic PSYCH:  Appropriate speech and behavior  *Additional and/or pertinent findings included in MDM below  Diagnostic and Interventional Summary    EKG Interpretation  Date/Time:  Wednesday September 28 2020 17:34:47 EST Ventricular Rate:  78 PR Interval:  182 QRS Duration: 96 QT Interval:  386 QTC Calculation: 440 R Axis:   45 Text Interpretation: Normal sinus rhythm Normal ECG Confirmed by Gerlene Fee [phone]) on 09/28/2020 5:43:01 PM      Labs Reviewed  CBC - Abnormal; Notable for the following components:      Result Value   RBC 1.97 (*)    Hemoglobin 6.7 (*)    HCT 20.5 (*)    MCV 104.1 (*)    Platelets 110 (*)    All other components within normal limits  COMPREHENSIVE METABOLIC PANEL - Abnormal; Notable for the following components:   Sodium 133 (*)    Potassium 6.8 (*)    CO2 19 (*)    Glucose, Bld 216 (*)    BUN 88 (*)    Creatinine, Ser 9.29 (*)    Calcium 8.3 (*)    Total Protein 5.7 (*)    Albumin 3.3 (*)    AST 13 (*)    GFR, Estimated 6 (*)    All other components within normal limits  BRAIN NATRIURETIC PEPTIDE - Abnormal; Notable for the following components:   B Natriuretic Peptide 325.0 (*)    All other components within normal limits  RESP PANEL BY RT-PCR (FLU A&B, COVID) ARPGX2  PROTIME-INR  LACTATE DEHYDROGENASE  KAPPA/LAMBDA LIGHT CHAINS  PROTEIN ELECTROPHORESIS, SERUM  RENAL FUNCTION PANEL  RENAL FUNCTION PANEL    URINALYSIS, ROUTINE W REFLEX MICROSCOPIC  TYPE AND SCREEN  PREPARE RBC (CROSSMATCH)  TROPONIN I (HIGH SENSITIVITY)  TROPONIN I (HIGH SENSITIVITY)    DG Chest Port 1 View  Final Result    US RENAL    (Results Pending)    Medications  sodium zirconium cyclosilicate (LOKELMA) packet 10 g (has no administration in time range)  0.9 %  sodium chloride infusion ( Intravenous New Bag/Given 09/28/20 1838)  0.9 %  sodium chloride infusion (Manually program via Guardrails IV Fluids) (has no administration in time range)  heparin  injection 5,000 Units (has no administration in time range)  calcium gluconate 1 g/ 50 mL sodium chloride IVPB (0 g Intravenous Stopped 09/28/20 1948)  sodium bicarbonate injection 50 mEq (50 mEq Intravenous Given 09/28/20 1844)  insulin aspart (novoLOG) injection 5 Units (5 Units Intravenous Given 09/28/20 1843)  dextrose 50 % solution 50 mL (50 mLs Intravenous Given 09/28/20 1839)     Procedures  /  Critical Care .Critical Care Performed by: Maudie Flakes, MD Authorized by: Maudie Flakes, MD   Critical care provider statement:    Critical care time (minutes):  45   Critical care was necessary to treat or prevent imminent or life-threatening deterioration of the following conditions: Hyperkalemia, acute renal failure.   Critical care was time spent personally by me on the following activities:  Discussions with consultants, evaluation of patient's response to treatment, examination of patient, ordering and performing treatments and interventions, ordering and review of laboratory studies, ordering and review of radiographic studies, pulse oximetry, re-evaluation of patient's condition, obtaining history from patient or surrogate and review of old charts    ED Course and Medical Decision Making  I have reviewed the triage vital signs, the nursing notes, and pertinent available records from the EMR.  Listed above are laboratory and imaging tests that I personally  ordered, reviewed, and interpreted and then considered in my medical decision making (see below for details).  Acute renal failure with hyperkalemia in the 30-year-old male with history of multiple myeloma, nephrectomy.  EKG without changes related to hyperkalemia.  Providing calcium, bicarb, insulin and D50.  Discussed case with Dr. Moshe Cipro of nephrology, patient to be admitted to the [facility] stepdown unit as patient may need dialysis.       Barth Kirks. [place] Small, [place] [email or web address]  Final Clinical Impressions(s) / ED Diagnoses     ICD-10-CM   1. Acute renal failure, unspecified acute renal failure type ([place])  N17.9   2. Hyperkalemia  E87.5   3. AKI (acute kidney injury) ([place])  N17.9 US RENAL    US RENAL    ED Discharge Orders    None       Discharge Instructions Discussed with and Provided to Patient:   Discharge Instructions   None       Maudie Flakes, MD 09/28/20 2127

## 2020-09-28 NOTE — Progress Notes (Signed)
Notified Hospitalist admission pt has arrived from [facility]

## 2020-09-28 NOTE — ED Notes (Signed)
Date and time results received: 09/28/20 1935 (use smartphrase ".now" to insert current time)  Test: Potassium Critical Value: 6.8  Name of Provider Notified: Bero  Orders Received? Or Actions Taken?:

## 2020-09-28 NOTE — Progress Notes (Signed)
CRITICAL VALUE STICKER  CRITICAL VALUE: Potassium 7.1  DATE & TIME NOTIFIED: 09/28/2020 3:55pm  MESSENGER (representative from lab): Parks Neptune  MD NOTIFIED: Dr. Delton Coombes  TIME OF NOTIFICATION: 3:55pm  RESPONSE: Called patient to come to ER

## 2020-09-28 NOTE — Progress Notes (Signed)
Was called regarding AKI in this 69 year old man with myeloma currently undergoing treatment with crt 2.59 on 11/8, then 2.78 on 11/22-  C/o SOB-  crt found to be 9.4 today with k of 7.1 and hgb of 7.1.  Medical treatment for hyperkalemia has been started- no ekg changes.  I feel it might be best to transfer to [facility] in case needs emergent HD overnight.  Have added IVF and lokelma, will hold ACE- I  and will see once at [facility] when determination for dialysis need will be made - I hope that K will respond to medical treatment in the short term   Louis Meckel

## 2020-09-29 ENCOUNTER — Inpatient Hospital Stay (HOSPITAL_COMMUNITY): Payer: Medicare Other

## 2020-09-29 ENCOUNTER — Encounter (HOSPITAL_COMMUNITY): Payer: Self-pay | Admitting: Internal Medicine

## 2020-09-29 DIAGNOSIS — D649 Anemia, unspecified: Secondary | ICD-10-CM | POA: Diagnosis not present

## 2020-09-29 DIAGNOSIS — C9000 Multiple myeloma not having achieved remission: Secondary | ICD-10-CM | POA: Diagnosis not present

## 2020-09-29 HISTORY — PX: IR FLUORO GUIDE CV LINE LEFT: IMG2282

## 2020-09-29 HISTORY — PX: IR US GUIDE VASC ACCESS LEFT: IMG2389

## 2020-09-29 LAB — GLUCOSE, CAPILLARY
Glucose-Capillary: 100 mg/dL — ABNORMAL HIGH (ref 70–99)
Glucose-Capillary: 110 mg/dL — ABNORMAL HIGH (ref 70–99)
Glucose-Capillary: 118 mg/dL — ABNORMAL HIGH (ref 70–99)
Glucose-Capillary: 148 mg/dL — ABNORMAL HIGH (ref 70–99)
Glucose-Capillary: 58 mg/dL — ABNORMAL LOW (ref 70–99)
Glucose-Capillary: 59 mg/dL — ABNORMAL LOW (ref 70–99)
Glucose-Capillary: 67 mg/dL — ABNORMAL LOW (ref 70–99)
Glucose-Capillary: 84 mg/dL (ref 70–99)

## 2020-09-29 LAB — CBC
HCT: 18.2 % — ABNORMAL LOW (ref 39.0–52.0)
Hemoglobin: 6.1 g/dL — CL (ref 13.0–17.0)
MCH: 33.9 pg (ref 26.0–34.0)
MCHC: 33.5 g/dL (ref 30.0–36.0)
MCV: 101.1 fL — ABNORMAL HIGH (ref 80.0–100.0)
Platelets: 104 10*3/uL — ABNORMAL LOW (ref 150–400)
RBC: 1.8 MIL/uL — ABNORMAL LOW (ref 4.22–5.81)
RDW: 15.6 % — ABNORMAL HIGH (ref 11.5–15.5)
WBC: 3.3 10*3/uL — ABNORMAL LOW (ref 4.0–10.5)
nRBC: 0 % (ref 0.0–0.2)

## 2020-09-29 LAB — HIV ANTIBODY (ROUTINE TESTING W REFLEX): HIV Screen 4th Generation wRfx: NONREACTIVE

## 2020-09-29 LAB — COMPREHENSIVE METABOLIC PANEL
ALT: 26 U/L (ref 0–44)
AST: 9 U/L — ABNORMAL LOW (ref 15–41)
Albumin: 2.9 g/dL — ABNORMAL LOW (ref 3.5–5.0)
Alkaline Phosphatase: 57 U/L (ref 38–126)
Anion gap: 13 (ref 5–15)
BUN: 89 mg/dL — ABNORMAL HIGH (ref 8–23)
CO2: 22 mmol/L (ref 22–32)
Calcium: 8.4 mg/dL — ABNORMAL LOW (ref 8.9–10.3)
Chloride: 104 mmol/L (ref 98–111)
Creatinine, Ser: 9.59 mg/dL — ABNORMAL HIGH (ref 0.61–1.24)
GFR, Estimated: 5 mL/min — ABNORMAL LOW (ref >60)
Glucose, Bld: 90 mg/dL (ref 70–99)
Potassium: 6.3 mmol/L (ref 3.5–5.1)
Sodium: 139 mmol/L (ref 135–145)
Total Bilirubin: 0.5 mg/dL (ref 0.3–1.2)
Total Protein: 5.2 g/dL — ABNORMAL LOW (ref 6.5–8.1)

## 2020-09-29 LAB — TYPE AND SCREEN
ABO/RH(D): A POS
Antibody Screen: POSITIVE

## 2020-09-29 LAB — HEMOGLOBIN A1C
Hgb A1c MFr Bld: 9.3 % — ABNORMAL HIGH (ref 4.8–5.6)
Mean Plasma Glucose: 220.21 mg/dL

## 2020-09-29 LAB — RENAL FUNCTION PANEL
Albumin: 2.9 g/dL — ABNORMAL LOW (ref 3.5–5.0)
Anion gap: 16 — ABNORMAL HIGH (ref 5–15)
BUN: 87 mg/dL — ABNORMAL HIGH (ref 8–23)
CO2: 21 mmol/L — ABNORMAL LOW (ref 22–32)
Calcium: 8.3 mg/dL — ABNORMAL LOW (ref 8.9–10.3)
Chloride: 100 mmol/L (ref 98–111)
Creatinine, Ser: 9.59 mg/dL — ABNORMAL HIGH (ref 0.61–1.24)
GFR, Estimated: 5 mL/min — ABNORMAL LOW (ref >60)
Glucose, Bld: 172 mg/dL — ABNORMAL HIGH (ref 70–99)
Phosphorus: 7.3 mg/dL — ABNORMAL HIGH (ref 2.5–4.6)
Potassium: 5.5 mmol/L — ABNORMAL HIGH (ref 3.5–5.1)
Sodium: 137 mmol/L (ref 135–145)

## 2020-09-29 LAB — HEMOGLOBIN AND HEMATOCRIT, BLOOD
HCT: 20.1 % — ABNORMAL LOW (ref 39.0–52.0)
Hemoglobin: 6.6 g/dL — CL (ref 13.0–17.0)

## 2020-09-29 LAB — PREPARE RBC (CROSSMATCH)

## 2020-09-29 MED ORDER — CHLORHEXIDINE GLUCONATE CLOTH 2 % EX PADS
6.0000 | MEDICATED_PAD | Freq: Every day | CUTANEOUS | Status: DC
  Administered 2020-09-29 – 2020-10-05 (×7): 6 via TOPICAL

## 2020-09-29 MED ORDER — FUROSEMIDE 10 MG/ML IJ SOLN
40.0000 mg | Freq: Two times a day (BID) | INTRAMUSCULAR | Status: DC
  Administered 2020-09-29: 40 mg via INTRAVENOUS
  Filled 2020-09-29: qty 4

## 2020-09-29 MED ORDER — SODIUM CHLORIDE 0.9% IV SOLUTION: Freq: Once | INTRAVENOUS | Status: AC

## 2020-09-29 MED ORDER — HEPARIN SODIUM (PORCINE) 5000 UNIT/ML IJ SOLN
5000.0000 [IU] | Freq: Three times a day (TID) | INTRAMUSCULAR | Status: DC
  Administered 2020-09-29 – 2020-10-05 (×15): 5000 [IU] via SUBCUTANEOUS
  Filled 2020-09-29 (×15): qty 1

## 2020-09-29 MED ORDER — HEPARIN SODIUM (PORCINE) 5000 UNIT/ML IJ SOLN: 5000.0000 [IU] | Freq: Three times a day (TID) | INTRAMUSCULAR | Status: DC

## 2020-09-29 MED ORDER — LIDOCAINE-EPINEPHRINE 1 %-1:100000 IJ SOLN
INTRAMUSCULAR | Status: AC
  Filled 2020-09-29: qty 1

## 2020-09-29 MED ORDER — FUROSEMIDE 10 MG/ML IJ SOLN: 80.0000 mg | Freq: Two times a day (BID) | INTRAMUSCULAR | Status: DC

## 2020-09-29 MED ORDER — BORTEZOMIB CHEMO SQ INJECTION 3.5 MG (2.5MG/ML): 1.3000 mg/m2 | Freq: Once | INTRAMUSCULAR | Status: DC

## 2020-09-29 MED ORDER — HEPARIN SODIUM (PORCINE) 1000 UNIT/ML IJ SOLN
INTRAMUSCULAR | Status: AC
  Filled 2020-09-29: qty 1

## 2020-09-29 MED ORDER — LIDOCAINE-EPINEPHRINE 1 %-1:100000 IJ SOLN
INTRAMUSCULAR | Status: DC | PRN
  Administered 2020-09-29: 5 mL

## 2020-09-29 MED ORDER — INSULIN DETEMIR 100 UNIT/ML ~~LOC~~ SOLN
10.0000 [IU] | Freq: Every day | SUBCUTANEOUS | Status: DC
  Administered 2020-09-29 – 2020-10-04 (×6): 10 [IU] via SUBCUTANEOUS
  Filled 2020-09-29 (×7): qty 0.1

## 2020-09-29 MED ORDER — FUROSEMIDE 10 MG/ML IJ SOLN
80.0000 mg | Freq: Four times a day (QID) | INTRAMUSCULAR | Status: DC
  Administered 2020-09-29 – 2020-10-05 (×24): 80 mg via INTRAVENOUS
  Filled 2020-09-29 (×24): qty 8

## 2020-09-29 NOTE — Progress Notes (Signed)
Patient Status: [name] - In-pt  Assessment and Plan: Patient in need of venous access.  Patient with history of myeloma admitted with shortness of breath.  Found to have anemia with Hgb 7.1 and SCr elevated above baseline with hyperkalemia.  Seen by Nephrology who recommends temp cath for temporary dialysis.  Called and discussed with Dr. Arty Baumgartner. Proceed with temp cath placement today, no urgent need for dialysis- possibly tomorrow.  Hoping that dialysis will be temporary.   Risks and benefits discussed with the patient including, but not limited to bleeding, infection, vascular injury, pneumothorax which may require chest tube placement, air embolism or even death  All of the patient's questions were answered, patient is agreeable to proceed. Consent signed and in chart.  ______________________________________________________________________   History of Present Illness: Tom Marshall is a 69 y.o. male with past medical history of myeloma currently undergoing treatment.  Developed shortness of breath at home and presented to [facility] ED/. Found to have elevated SCr with hyperkalemia. Due to significant acute change within 2 week time frame, Neph hopeful this is acute kidney failure with only temporary dialysis needed.  Allergies and medications reviewed.   Review of Systems: A 12 point ROS discussed and pertinent positives are indicated in the HPI above.  All other systems are negative.  Review of Systems  Constitutional: Negative for fatigue and fever.  Respiratory: Positive for shortness of breath. Negative for cough.   Cardiovascular: Negative for chest pain.  Gastrointestinal: Negative for abdominal pain, nausea and vomiting.  Musculoskeletal: Negative for back pain.  Psychiatric/Behavioral: Negative for behavioral problems and confusion.    Vital Signs: BP 120/71   Pulse 70   Temp 98.1 F (36.7 C) (Oral)   Resp 16   Ht 5' 7.99" (1.727 m)   Wt 235 lb 0.2 oz (106.6 kg)    SpO2 98%   BMI 35.74 kg/m   Physical Exam Vitals and nursing note reviewed.  Constitutional:      General: He is not in acute distress.    Appearance: He is well-developed. He is not ill-appearing.  Cardiovascular:     Rate and Rhythm: Normal rate.  Pulmonary:     Effort: Pulmonary effort is normal.  Skin:    General: Skin is warm and dry.  Neurological:     General: No focal deficit present.     Mental Status: He is alert and oriented to person, place, and time.  Psychiatric:        Mood and Affect: Mood normal.        Behavior: Behavior normal.      Imaging reviewed.   Labs:  COAGS: Recent Labs    09/28/20 1824  INR 1.2    BMP: Recent Labs    06/13/20 0908 06/13/20 0908 06/27/20 0859 06/27/20 0859 07/11/20 0913 07/11/20 0913 07/25/20 0911 08/08/20 0843 09/19/20 0925 09/28/20 1458 09/28/20 1824 09/29/20 0500  NA 135   < > 135   < > 133*   < > 136   < > 137 133* 133* 139  K 4.0   < > 3.7   < > 3.4*   < > 3.5   < > 4.2 7.1* 6.8* 6.3*  CL 97*   < > 100   < > 96*   < > 99   < > 104 101 103 104  CO2 26   < > 25   < > 25   < > 25   < > 23 19* 19*  22  GLUCOSE 391*   < > 229*   < > 308*   < > 232*   < > 269* 263* 216* 90  BUN 24*   < > 29*   < > 30*   < > 28*   < > 42* 86* 88* 89*  CALCIUM 8.9   < > 8.9   < > 9.1   < > 8.6*   < > 9.0 8.4* 8.3* 8.4*  CREATININE 2.46*   < > 2.62*   < > 2.43*   < > 2.45*   < > 2.78* 9.42* 9.29* 9.59*  GFRNONAA 26*   < > 24*   < > 26*   < > 26*   < > 24* 5* 6* 5*  GFRAA 30*  --  27*  --  30*  --  30*  --   --   --   --   --    < > = values in this interval not displayed.       Electronically Signed: Docia Barrier, PA 09/29/2020, 2:48 PM   I spent a total of 15 minutes in face to face in clinical consultation, greater than 50% of which was counseling/coordinating care for venous access.

## 2020-09-29 NOTE — Procedures (Signed)
Pre-procedure Diagnosis: ESRD Post-procedure Diagnosis: Same  Successful placement of tunneled HD catheter with tips terminating within the superior aspect of the right atrium.    Complications: None Immediate  EBL: Minimal   The catheter is ready for immediate use.   Jay Sobia Karger, MD Pager #: [phone]   

## 2020-09-29 NOTE — Consult Note (Addendum)
[place]  [phone] [phone]  ID: Tom Marshall OB: 1951/05/30 MR#: [id number] MEQ#:[id number] PCP: Asencion Noble, MD OTHER MD: Dr. Derek Jack (Medical Oncology)  CHIEF COMPLAINT: Multiple myeloma, anemia  INTERVAL HISTORY: Tom Marshall is a 69 year old male from [place], [place].  He has a past medical history significant for renal cell carcinoma status post nephrectomy in 1998, CKD stage IV with baseline creatinine of 2.5-2.7, diabetes mellitus, hypertension, hyperlipidemia, multiple myeloma undergoing active chemotherapy by Dr. Delton Coombes.  The patient presented to the hospital with shortness of breath for several days.  He was seen at the cancer center and had lab work performed which showed renal failure and hyperkalemia.  He was advised to go to the hospital for further evaluation and management.  Admission lab work showed a potassium of 6.8, bicarb 19, BUN 88, creatinine 9.29, WBC 4.3, hemoglobin 6.7, platelets 110,000.  The patient has been seen by nephrology. They are monitoring renal function. May need to start HD if renal function not improving.   Review of office notes from Dr. Delton Coombes indicate that he has received prior therapy including: 1. Revlimid x 4 cycles from 09/05/2017 to 02/18/2018 with Revlimid maintenance. 2. Pomalyst from 06/20/2020 to 09/19/2020. 3. Darzalex x 6 cycles from 04/11/2020 to 09/05/2020.  He was last seen on 09/26/2020 with plans to stop Darzalex and to start Velcade and Selinexor next week due to rising kappa free light chains. The patient was due to begin this treatment on 10/04/2020. Selinexor tablets were sent to the [facility] for him to pick up.   SPEP and kappa/lambda light chains drawn 09/28/2020 and are pending.  Medical oncology was asked see the patient make recommendations regarding his myeloma and anemia.  REVIEW OF SYSTEMS: The patient reports that he has been feeling well  overall with the exception of dyspnea for the past few days.  He thought his dyspnea was secondary to anemia which is why he asked for labs to be checked.  He also noticed some increased abdominal distention over the past few days which has now improved since receiving Lasix.  Denies fevers, chills, chest pain, nausea, vomiting, constipation, diarrhea.  No bleeding reported.  The remainder of the review of systems is noncontributory.  PAST MEDICAL HISTORY: Past Medical History:  Diagnosis Date  . Cancer ([place])    multiple myeloma  . Cancer of right kidney ([place])   . Cervical dystonia   . Diabetes mellitus without complication ([place])   . Gout   . Hypercholesteremia    PAST SURGICAL HISTORY: Past Surgical History:  Procedure Laterality Date  . BONE MARROW BIOPSY    . CHOLECYSTECTOMY  2007  . COLONOSCOPY WITH PROPOFOL N/A 01/16/2018   Procedure: COLONOSCOPY WITH PROPOFOL;  Surgeon: Daneil Dolin, MD;  Location[facility];  Service: Endoscopy;  Laterality: N/A;  1:45pm  . ESOPHAGOGASTRODUODENOSCOPY (EGD) WITH PROPOFOL N/A 01/16/2018   Procedure: ESOPHAGOGASTRODUODENOSCOPY (EGD) WITH PROPOFOL;  Surgeon: Daneil Dolin, MD;  Location: [facility];  Service: Endoscopy;  Laterality: N/A;  . GIVENS CAPSULE STUDY N/A 05/12/2018   Procedure: GIVENS CAPSULE STUDY;  Surgeon: Daneil Dolin, MD;  Location: [facility];  Service: Endoscopy;  Laterality: N/A;  7:30am  . NEPHRECTOMY Right 1998   cancer  . PORTACATH PLACEMENT Right 04/07/2020   Procedure: INSERTION PORT-A-CATH (attached catherter in right internal jugular);  Surgeon: Virl Cagey, MD;  Location: [facility] ORS;  Service: General;  Laterality: Right;   FAMILY HISTORY  Family History  Problem Relation Age of Onset  . Heart failure Mother 80  . Dementia Father   . Colon cancer Neg Hx     SOCIAL HISTORY: The patient lives in [place], [place].  He is not married.  He has no children. He has two dogs. He has a sister named,  Tom Marshall, who checks on him.  She is also his healthcare power of attorney.  Has history of alcohol and tobacco use. He has a pilot's license.   ADVANCED DIRECTIVES: He has completed a Living Will and HCPOA. His sister, Tom Marshall is named as his HCPOA.   HEALTH MAINTENANCE: Social History   Tobacco Use  . Smoking status: Never Smoker  . Smokeless tobacco: Former Systems developer    Types: Secondary school teacher  . Vaping Use: Never used  Substance Use Topics  . Alcohol use: No  . Drug use: No   Colonoscopy: 01/16/2018 Bone density: Lipid panel: No Known Allergies Current Facility-Administered Medications  Medication Dose Route Frequency Provider Last Rate Last Admin  . 0.9 %  sodium chloride infusion (Manually program via Guardrails IV Fluids)   Intravenous Once Caren Griffins, MD      . acetaminophen (TYLENOL) tablet 650 mg  650 mg Oral Q6H PRN Caren Griffins, MD      . alfuzosin (UROXATRAL) 24 hr tablet 10 mg  10 mg Oral Q breakfast Caren Griffins, MD   10 mg at 09/29/20 0941  . Chlorhexidine Gluconate Cloth 2 % PADS 6 each  6 each Topical Daily Shawna Clamp, MD   6 each at 09/29/20 1000  . dicyclomine (BENTYL) capsule 10 mg  10 mg Oral BID PRN Caren Griffins, MD      . diphenoxylate-atropine (LOMOTIL) 2.5-0.025 MG per tablet 2 tablet  2 tablet Oral QID PRN Caren Griffins, MD      . furosemide (LASIX) injection 80 mg  80 mg Intravenous BID Donato Heinz, MD      . heparin injection 5,000 Units  5,000 Units Subcutaneous Q8H Caren Griffins, MD   5,000 Units at 09/29/20 0523  . HYDROcodone-acetaminophen (NORCO/VICODIN) 5-325 MG per tablet 1 tablet  1 tablet Oral BID PRN Caren Griffins, MD      . insulin aspart (novoLOG) injection 0-9 Units  0-9 Units Subcutaneous TID WC Gherghe, Costin M, MD      . insulin detemir (LEVEMIR) injection 10 Units  10 Units Subcutaneous QHS Shawna Clamp, MD      . pravastatin (PRAVACHOL) tablet 20 mg  20 mg Oral QHS Caren Griffins, MD   20 mg at 09/29/20 0034  . sodium bicarbonate tablet 1,300 mg  1,300 mg Oral BID Caren Griffins, MD   1,300 mg at 09/29/20 0940  . sodium zirconium cyclosilicate (LOKELMA) packet 10 g  10 g Oral TID Corliss Parish, MD   10 g at 09/29/20 0940  . traZODone (DESYREL) tablet 100 mg  100 mg Oral QHS Caren Griffins, MD   100 mg at 09/29/20 0034   Facility-Administered Medications Ordered in Other Encounters  Medication Dose Route Frequency Provider Last Rate Last Admin  . 0.9 %  sodium chloride infusion  250 mL Intravenous Once Twana First, MD       OBJECTIVE: General: Well-appearing, examined in bed Vitals:   09/29/20 1102 09/29/20 1124  BP: 108/61 (!) 121/59  Pulse: 73 70  Resp: 16 16  Temp: 98 F (36.7 C) 98.3 F (36.8  C)  SpO2: 95% 98%   Body mass index is 35.74 kg/m. ECOG FS:1 - Symptomatic but completely ambulatory Ocular: Sclerae unicteric, pupils equal, round and reactive to light Ear-nose-throat: Oropharynx clear, dentition fair Lymphatic: No cervical or supraclavicular adenopathy Lungs no rales or rhonchi, good excursion bilaterally Heart regular rate and rhythm, no murmur appreciated Abd positive bowel sounds, slightly distended, nontender Extremities with trace bilateral lower extremity edema MSK no focal spinal tenderness, no joint edema Neuro: non-focal, well-oriented, appropriate affect  LAB RESULTS: CMP     Component Value Date/Time   NA 139 09/29/2020 0500   K 6.3 (HH) 09/29/2020 0500   CL 104 09/29/2020 0500   CO2 22 09/29/2020 0500   GLUCOSE 90 09/29/2020 0500   BUN 89 (H) 09/29/2020 0500   CREATININE 9.59 (H) 09/29/2020 0500   CALCIUM 8.4 (L) 09/29/2020 0500   CALCIUM 10.7 (H) 07/25/2017 0447   PROT 5.2 (L) 09/29/2020 0500   ALBUMIN 2.9 (L) 09/29/2020 0500   AST 9 (L) 09/29/2020 0500   ALT 26 09/29/2020 0500   ALKPHOS 57 09/29/2020 0500   BILITOT 0.5 09/29/2020 0500   GFRNONAA 5 (L) 09/29/2020 0500   GFRAA 30 (L) 07/25/2020 0911    INo results found for: SPEP, UPEP Lab Results  Component Value Date   WBC 3.3 (L) 09/29/2020   NEUTROABS 3.0 09/28/2020   HGB 6.1 (LL) 09/29/2020   HCT 18.2 (L) 09/29/2020   MCV 101.1 (H) 09/29/2020   PLT 104 (L) 09/29/2020   '@LASTCHEMISTRY' @ No results found for: LABCA2 No components found for: LABCA125 Recent Labs  Lab 09/28/20 1824  INR 1.2   Urinalysis    Component Value Date/Time   COLORURINE STRAW (A) 09/28/2020 2109   APPEARANCEUR CLEAR 09/28/2020 2109   APPEARANCEUR Clear 07/12/2020 0938   LABSPEC 1.011 09/28/2020 2109   PHURINE 7.0 09/28/2020 2109   GLUCOSEU 150 (A) 09/28/2020 2109   HGBUR SMALL (A) 09/28/2020 2109   BILIRUBINUR NEGATIVE 09/28/2020 2109   BILIRUBINUR Negative 07/12/2020 [place] NEGATIVE 09/28/2020 2109   PROTEINUR 30 (A) 09/28/2020 2109   NITRITE NEGATIVE 09/28/2020 2109   LEUKOCYTESUR NEGATIVE 09/28/2020 2109   STUDIES: US RENAL  Result Date: 09/28/2020 CLINICAL DATA:  69 year old male with acute renal insufficiency. Prior right nephrectomy. EXAM: RENAL / URINARY TRACT ULTRASOUND COMPLETE COMPARISON:  None. FINDINGS: Right Kidney: Nephrectomy. Left Kidney: Renal measurements: 12.2 x 6.1 x 5.3 cm = volume: 207 mL. Normal echogenicity. No hydronephrosis or shadowing stone. There is an 8.3 x 6.8 x 8.8 cm left renal inferior pole cyst. Bladder: Appears normal for degree of bladder distention. Other: There is diffuse increased liver echogenicity most commonly seen in the setting of fatty infiltration. Superimposed inflammation or fibrosis is not excluded. Clinical correlation is recommended. IMPRESSION: 1. Prior right nephrectomy. 2. Large left renal inferior pole cyst. No hydronephrosis or shadowing stone. Electronically Signed   By: Anner Crete M.D.   On: 09/28/2020 23:48   DG Chest Port 1 View  Result Date: 09/28/2020 CLINICAL DATA:  69 year old male with shortness of breath. EXAM: PORTABLE CHEST 1 VIEW COMPARISON:  Chest radiograph  dated 05/18/2020. FINDINGS: Right-sided Port-A-Cath in similar position. There is diffuse interstitial coarsening and mild chronic bronchitic changes. There are bibasilar atelectasis/scarring. No focal consolidation, pleural effusion, pneumothorax. Stable cardiac silhouette. No acute osseous pathology. Old rib fractures. IMPRESSION: No acute cardiopulmonary process. Electronically Signed   By: Anner Crete M.D.   On: 09/28/2020 18:30   ASSESSMENT: 69 y.o. male from  [place], [place] with   1) IgG kappa multiple myeloma.  (a) Revlimid x 4 cycles from 09/05/2017 to 02/18/2018 with Revlimid maintenance.  (b) Pomalyst from 06/20/2020 to 09/19/2020.  (c) Darzalex x 6 cycles from 04/11/2020 to 09/05/2020.  (d) Velcade and Selinexor to begin 10/04/2020  2) Anemia  3) AKI/CKD  PLAN: Mr. Sakai is feeling much better today.  We discussed his lab work including the fact that he is anemic.  Recommend transfusion with the least incompatible PRBCs.  1 unit is currently infusing. Recommend to transfuse to keep his hemoglobin above 7.  We discussed his treatment for multiple myeloma which is planned to start on 10/04/2020.  We will keep this is scheduled at the [facility].  However, may need to consider starting treatment as an inpatient depending on length of stay.  We will follow up on pending SPEP and kappa/lambda light chains drawn 09/28/2020.  Nephrology following closely may consider starting hemodialysis tomorrow.  Tom Bussing, Tom Marshall 09/29/2020 11:49 AM    ADDENDUM: 69 y/o [place] man with a history of IgA/kappa myeloma diagnosed by [facility] 08/15/2020; treated in the past with bortezomib (caused neuropathy), lenalidomide, pomalidomide and most recently daratumumab (04/11/2020 through 09/05/2020), but with progression Results for EYOB, GODLEWSKI" (MRN [id number]) as of 09/29/2020 18:17  Ref. Range 05/30/2020 08:06 06/06/2020 08:03 07/11/2020 09:13 08/08/2020 08:43  09/05/2020 08:57  Kappa free light chain Latest Ref Range: 3.3 - 19.4 mg/L 770.3 (H) 922.9 (H) 1,416.1 (H) 1,501.5 (H) 2,637.4 (H)  Results for NESHAWN, AIRD" (MRN [id number]) as of 09/29/2020 18:17  Ref. Range 05/30/2020 08:06 06/06/2020 08:03 07/11/2020 09:13 08/08/2020 08:43 09/05/2020 08:57  Kappa, lamda light chain ratio Latest Ref Range: 0.26 - 1.65  183.40 (H) 214.63 (H) 168.58 (H) 268.13 (H) 561.15 (H)   He was scheduled to begin bortezomib and selinexor 10/04/2020 at [facility] but presented to the ED with AKI/CKI and anemia. He is being transfused tonight and is scheduled for HD tomorrow.  I discussed with Mr Richert that daratumumab complicates blood typing and crossing and it is not possible to find a "compatible" unit. He is receiving the "least incompatible" unit available and while we do feel this is safe for him (and he tolerated the first one well) he needed to understand that small uncertainty.  We discussed possible starting treatment here. I would give him a bortezomib dose tomorrow after dialysis; no dose reduction is needed. This will hopefullly start to control the disease and facilitate an earlier discharge. He can receive another bortezomib dose with the selinexor next Tuesday 10/04/2020 as planned.  We will follow with you during this admission   I personally saw this patient and performed a substantive portion of this encounter with the listed APP documented above.   Chauncey Cruel, MD Medical Oncology and Hematology [facility] [address] [place], [place] [address] Tel. [phone]    Fax. [phone]

## 2020-09-29 NOTE — Progress Notes (Signed)
Labs drawn for screen type and match and sent to lab.

## 2020-09-29 NOTE — Progress Notes (Signed)
Addendum to 09/29/2020 note:  Social Hx: Patient is single, lives alone with his "buddy dog." Sister Dandra Velardi lives next door and is his 65.  Vaccinations: Received Moderna x2

## 2020-09-29 NOTE — Progress Notes (Signed)
Labs collected and sent to lab per Dr. Moshe Cipro.

## 2020-09-29 NOTE — Plan of Care (Signed)
  Problem: Education: Goal: Knowledge of disease and its progression will improve Outcome: Progressing Goal: Individualized Educational Video(s) Outcome: Progressing   Problem: Fluid Volume: Goal: Compliance with measures to maintain balanced fluid volume will improve Outcome: Progressing   Problem: Health Behavior/Discharge Planning: Goal: Ability to manage health-related needs will improve Outcome: Progressing   Problem: Nutritional: Goal: Ability to make healthy dietary choices will improve Outcome: Progressing   Problem: Clinical Measurements: Goal: Complications related to the disease process, condition or treatment will be avoided or minimized Outcome: Progressing   Problem: Education: Goal: Knowledge of General Education information will improve Description: Including pain rating scale, medication(s)/side effects and non-pharmacologic comfort measures Outcome: Progressing   Problem: Health Behavior/Discharge Planning: Goal: Ability to manage health-related needs will improve Outcome: Progressing   Problem: Clinical Measurements: Goal: Ability to maintain clinical measurements within normal limits will improve Outcome: Progressing Goal: Will remain free from infection Outcome: Progressing Goal: Diagnostic test results will improve Outcome: Progressing Goal: Respiratory complications will improve Outcome: Progressing Goal: Cardiovascular complication will be avoided Outcome: Progressing   Problem: Activity: Goal: Risk for activity intolerance will decrease Outcome: Progressing   Problem: Nutrition: Goal: Adequate nutrition will be maintained Outcome: Progressing   Problem: Coping: Goal: Level of anxiety will decrease Outcome: Progressing   Problem: Elimination: Goal: Will not experience complications related to bowel motility Outcome: Progressing Goal: Will not experience complications related to urinary retention Outcome: Progressing   Problem: Pain  Managment: Goal: General experience of comfort will improve Outcome: Progressing   Problem: Safety: Goal: Ability to remain free from injury will improve Outcome: Progressing   Problem: Skin Integrity: Goal: Risk for impaired skin integrity will decrease Outcome: Progressing   Problem: Education: Goal: Knowledge of disease and its progression will improve Outcome: Progressing   Problem: Health Behavior/Discharge Planning: Goal: Ability to manage health-related needs will improve Outcome: Progressing   Problem: Clinical Measurements: Goal: Complications related to the disease process or treatment will be avoided or minimized Outcome: Progressing Goal: Dialysis access will remain free of complications Outcome: Progressing   Problem: Activity: Goal: Activity intolerance will improve Outcome: Progressing   Problem: Fluid Volume: Goal: Fluid volume balance will be maintained or improved Outcome: Progressing   Problem: Nutritional: Goal: Ability to make appropriate dietary choices will improve Outcome: Progressing   Problem: Respiratory: Goal: Respiratory symptoms related to disease process will be avoided Outcome: Progressing   Problem: Self-Concept: Goal: Body image disturbance will be avoided or minimized Outcome: Progressing   Problem: Urinary Elimination: Goal: Progression of disease will be identified and treated Outcome: Progressing

## 2020-09-29 NOTE — Progress Notes (Signed)
Inpatient Diabetes Program Recommendations  AACE/ADA: New Consensus Statement on Inpatient Glycemic Control (2015)  Target Ranges:  Prepandial:   less than 140 mg/dL      Peak postprandial:   less than 180 mg/dL (1-2 hours)      Critically ill patients:  140 - 180 mg/dL   Lab Results  Component Value Date   GLUCAP 110 (H) 09/29/2020   HGBA1C 9.3 (H) 09/29/2020    Review of Glycemic Control Results for LEARY, MCNULTY" (MRN [id number]) as of 09/29/2020 08:59  Ref. Range 09/29/2020 07:30 09/29/2020 07:49  Glucose-Capillary Latest Ref Range: 70 - 99 mg/dL 59 (L) 110 (H)   Diabetes history: Type 2 DM Outpatient Diabetes medications: Tresiba 50 units QD, Novolog 8 units TID, Glipizide 10 mg QAM Current orders for Inpatient glycemic control: Novolog 0-9 units TID, Levemir 20 units QHS  Inpatient Diabetes Program Recommendations:    Noted hypoglycemic event of 59 mg/dL this AM. Given renal status consider reducing Levemir to 10 units QHS (106 kg x 0.1).   Thanks, Bronson Curb, MSN, RNC-OB Diabetes Coordinator [phone] (8a-5p)

## 2020-09-29 NOTE — Progress Notes (Signed)
Hypoglycemic Event  CBG: 59  Treatment: Breakfast tray/Apple juice Symptoms: Diaphoretic  Follow-up CBG: Time: 0749 CBG Result:110  Possible Reasons for Event: Unknown    Tom Marshall

## 2020-09-29 NOTE — Progress Notes (Signed)
Patient ID: Tom Marshall, male   DOB: 07-05-1951, 69 y.o.   MRN: [id number] S: Feels a little better this morning. O:BP 120/71   Pulse 70   Temp 98.1 F (36.7 C) (Oral)   Resp 16   Ht 5' 7.99" (1.727 m)   Wt 106.6 kg   SpO2 98%   BMI 35.74 kg/m   Intake/Output Summary (Last 24 hours) at 09/29/2020 1341 Last data filed at 09/29/2020 1300 Gross per 24 hour  Intake 1797 ml  Output 650 ml  Net 1147 ml   Intake/Output: I/O last 3 completed shifts: In: 0569 [P.O.:100; I.V.:1125; IV Piggyback:50] Out: 450 [Urine:450]  Intake/Output this shift:  Total I/O In: 522 [P.O.:522] Out: 200 [Urine:200] Weight change:  Gen: WD obese WM in NAD CVS: RRR Resp: CTA with decreased BS at bases bilaterally Abd: obese, +BS, soft, NT Ext: 1+ pitting edema of bilateral lower extremities  Recent Labs  Lab 09/28/20 1458 09/28/20 1824 09/29/20 0500  NA 133* 133* 139  K 7.1* 6.8* 6.3*  CL 101 103 104  CO2 19* 19* 22  GLUCOSE 263* 216* 90  BUN 86* 88* 89*  CREATININE 9.42* 9.29* 9.59*  ALBUMIN 3.5 3.3* 2.9*  CALCIUM 8.4* 8.3* 8.4*  AST 16 13* 9*  ALT '31 29 26   ' Liver Function Tests: Recent Labs  Lab 09/28/20 1458 09/28/20 1824 09/29/20 0500  AST 16 13* 9*  ALT '31 29 26  ' ALKPHOS 68 61 57  BILITOT 0.6 0.5 0.5  PROT 6.1* 5.7* 5.2*  ALBUMIN 3.5 3.3* 2.9*   No results for input(s): LIPASE, AMYLASE in the last 168 hours. No results for input(s): AMMONIA in the last 168 hours. CBC: Recent Labs  Lab 09/28/20 1458 09/28/20 1824 09/29/20 0500  WBC 3.5* 4.3 3.3*  NEUTROABS 3.0  --   --   HGB 7.1* 6.7* 6.1*  HCT 22.1* 20.5* 18.2*  MCV 105.7* 104.1* 101.1*  PLT 104* 110* 104*   Cardiac Enzymes: No results for input(s): CKTOTAL, CKMB, CKMBINDEX, TROPONINI in the last 168 hours. CBG: Recent Labs  Lab 09/29/20 0033 09/29/20 0730 09/29/20 0749 09/29/20 1306 09/29/20 1322  GLUCAP 118* 59* 110* 58* 100*    Iron Studies: No results for input(s): IRON, TIBC, TRANSFERRIN,  FERRITIN in the last 72 hours. Studies/Results: US RENAL  Result Date: 09/28/2020 CLINICAL DATA:  68 year old male with acute renal insufficiency. Prior right nephrectomy. EXAM: RENAL / URINARY TRACT ULTRASOUND COMPLETE COMPARISON:  None. FINDINGS: Right Kidney: Nephrectomy. Left Kidney: Renal measurements: 12.2 x 6.1 x 5.3 cm = volume: 207 mL. Normal echogenicity. No hydronephrosis or shadowing stone. There is an 8.3 x 6.8 x 8.8 cm left renal inferior pole cyst. Bladder: Appears normal for degree of bladder distention. Other: There is diffuse increased liver echogenicity most commonly seen in the setting of fatty infiltration. Superimposed inflammation or fibrosis is not excluded. Clinical correlation is recommended. IMPRESSION: 1. Prior right nephrectomy. 2. Large left renal inferior pole cyst. No hydronephrosis or shadowing stone. Electronically Signed   By: Anner Crete M.D.   On: 09/28/2020 23:48   DG Chest Port 1 View  Result Date: 09/28/2020 CLINICAL DATA:  69 year old male with shortness of breath. EXAM: PORTABLE CHEST 1 VIEW COMPARISON:  Chest radiograph dated 05/18/2020. FINDINGS: Right-sided Port-A-Cath in similar position. There is diffuse interstitial coarsening and mild chronic bronchitic changes. There are bibasilar atelectasis/scarring. No focal consolidation, pleural effusion, pneumothorax. Stable cardiac silhouette. No acute osseous pathology. Old rib fractures. IMPRESSION: No acute cardiopulmonary process. Electronically  Signed   By: Anner Crete M.D.   On: 09/28/2020 18:30   . sodium chloride   Intravenous Once  . alfuzosin  10 mg Oral Q breakfast  . Chlorhexidine Gluconate Cloth  6 each Topical Daily  . furosemide  80 mg Intravenous BID  . heparin injection (subcutaneous)  5,000 Units Subcutaneous Q8H  . insulin aspart  0-9 Units Subcutaneous TID WC  . insulin detemir  10 Units Subcutaneous QHS  . pravastatin  20 mg Oral QHS  . sodium bicarbonate  1,300 mg Oral BID  .  sodium zirconium cyclosilicate  10 g Oral TID  . traZODone  100 mg Oral QHS    BMET    Component Value Date/Time   NA 139 09/29/2020 0500   K 6.3 (HH) 09/29/2020 0500   CL 104 09/29/2020 0500   CO2 22 09/29/2020 0500   GLUCOSE 90 09/29/2020 0500   BUN 89 (H) 09/29/2020 0500   CREATININE 9.59 (H) 09/29/2020 0500   CALCIUM 8.4 (L) 09/29/2020 0500   CALCIUM 10.7 (H) 07/25/2017 0447   GFRNONAA 5 (L) 09/29/2020 0500   GFRAA 30 (L) 07/25/2020 0911   CBC    Component Value Date/Time   WBC 3.3 (L) 09/29/2020 0500   RBC 1.80 (L) 09/29/2020 0500   HGB 6.1 (LL) 09/29/2020 0500   HCT 18.2 (L) 09/29/2020 0500   PLT 104 (L) 09/29/2020 0500   MCV 101.1 (H) 09/29/2020 0500   MCH 33.9 09/29/2020 0500   MCHC 33.5 09/29/2020 0500   RDW 15.6 (H) 09/29/2020 0500   LYMPHSABS 0.3 (L) 09/28/2020 1458   MONOABS 0.3 09/28/2020 1458   EOSABS 0.0 09/28/2020 1458   BASOSABS 0.0 09/28/2020 1458     Assessment/Plan:  1. AKI/CKD stage IV- pt with solitary kidney s/p right Nx due to [place] and ongoing treatment for multiple myeloma.  Scr jumped from baseline of 2.78 on 11/22 up to 9.42 on 09/28/20.  He had been on lisinopril 5 mg daily prior to admission and did have some diarrhea as well as progressive increased lower extremity edema.   1. No significant improvement of UOP or BUN/Cr with IV lasix.  Will increase dose to 80 mg and frequency to tid and follow 2. If no significant improvement will likely need to initiate HD (hopefully on a temporary basis) tomorrow.  Will consult IR for temp HD catheter placement tentatively for tomorrow. 3. No emergent indication for HD at this time as K is improving.  2. Hyperkalemia- improving with lokelma and IV lasix 3. Anasarca/volume overload- unclear if he has a history of CHF but low albumin and AKI likely contributing as well as myeloma.   4. HTN 5. Multiple myeloma- currently receiving chemo at the cancer center at [facility]. 6. Anemia- acute on chronic  - s/p blood transfusion this morning.  Continue to follow  Donetta Potts, MD [facility] [phone]

## 2020-09-29 NOTE — Progress Notes (Addendum)
CRITICAL VALUE ALERT  Critical Value:  Hgb 6.6  Date & Time Notied:  09/29/20 1657  Provider Notified: Dwyane Dee, MD  Orders Received/Actions taken: order to administer 1 unit RBC per Dwyane Dee, MD

## 2020-09-29 NOTE — Progress Notes (Signed)
Hypoglycemic Event  CBG: 58  Treatment: 4oz juice and lunch tray   Symptoms: None  Follow-up CBG: Time:1322 CBG Result:100   Possible Reasons for Event: Unknown     Tom Marshall

## 2020-09-29 NOTE — Progress Notes (Signed)
Renal function panel due to be drawn at 1200. Blood transfusion currently being infused. Will draw renal function panel and repeat hemoglobin 2 hours post transfusion.

## 2020-09-29 NOTE — Progress Notes (Addendum)
PROGRESS NOTE    Tom Marshall  MGQ:[id number] DOB: 1951-07-05 DOA: 09/28/2020 PCP: Asencion Noble, MD   Brief Narrative:  This 69 years old male with PMH of renal cell cancer status post nephrectomy 1998 currently with solitary kidney, CKD stage IV with baseline creatinine between 2.5-2.7, diabetes mellitus, hypertension, hyperlipidemia,  multiple myeloma, actively undergoing treatment at [facility] with Dr. Delton Coombes presented to the emergency department with acute shortness of breath .  He was called by his oncologist to go to the emergency department because of worsening renal functions and hyperkalemia. In the ED his blood work shows a potassium 6.8, bicarb 19, BUN 88, creatinine 9.29, BNP 325, hemoglobin 6.7. Nephrology was consulted,  recommended temporary hemodialysis.  Hematology was consulted.   Assessment & Plan:   Active Problems:   AKI (acute kidney injury) ([place])   Acute kidney injury on chronic kidney disease stage IV with hyperkalemia: Baseline creatinine around 2.4, currently at 9.   On-call nephrology, Dr. Moshe Cipro recommended transfer to [facility]. Admitted to stepdown EKG does not show changes of hyperkalemia,  patient was given dextrose and insulin, calcium, as well as has been placed on scheduled Lokelma -Is possible that this is a myeloma kidney given recent concern as an outpatient at the street light chains were increasing.   Obtain light chains, LDH, SPEP,  -Nephrology recommended temporary hemodialysis. -Patient underwent temporary tunneled catheter for hemodialysis by IR -Serum potassium is improving with scheduled Lokelma.  Type 2 diabetes mellitus -Resume long-acting insulin along with sliding scale, reduced doses due to renal failure.  Multiple myeloma -Case was discussed over the phone with Dr. Delton Coombes who recommended blood work as above. -Oncology consulted, outpatient [place] for [place] myeloma [place]  Essential hypertension -Hold lisinopril   Hyperlipidemia -continue statin  Symptomatic anemia, thrombocytopenia -Likely in the setting of underlying malignancy, transfuse unit of packed red blood cells. Transfuse less compatible PRBC, keep hemoglobin above 7. He is getting 1 unit PRBC,  follow posttransfusion hemoglobin.  History of DVT -Patient reports DVT several years ago, has been on Eliquis but only takes it 2.5 mg once daily.   Given that he may need procedures in the next day or so, acute worsening of his renal function hold in place on heparin s.q.    DVT prophylaxis: Heparin subcu Code Status: Full code Family Communication: No one at bedside Disposition Plan:   Status is: Inpatient  Remains inpatient appropriate because:Inpatient level of care appropriate due to severity of illness   Dispo: The patient is from: Home              Anticipated d/c is to: Home              Anticipated d/c date is: 3 days              Patient currently is not medically stable to d/c.   Consultants:   Nephrology, interventional radiology, oncology  Procedures: Temporary hemodialysis catheter Antimicrobials: Anti-infectives (From admission, onward)   None      Subjective: Patient was seen and examined at bedside.  Overnight events noted.  He denies any shortness of breath.  He appears pale.  He is getting blood transfusion.  Objective: Vitals:   09/29/20 1029 09/29/20 1102 09/29/20 1124 09/29/20 1336  BP: 105/67 108/61 (!) 121/59 120/71  Pulse: 72 73 70 70  Resp: '14 16 16 16  ' Temp: 98.5 F (36.9 C) 98 F (36.7 C) 98.3 F (36.8 C)  98.1 F (36.7 C)  TempSrc: Oral Oral Oral Oral  SpO2: 98% 95% 98% 98%  Weight:      Height:        Intake/Output Summary (Last 24 hours) at 09/29/2020 1508 Last data filed at 09/29/2020 1300 Gross per 24 hour  Intake 1797 ml  Output 900 ml  Net 897 ml   Filed Weights   09/28/20 1736 09/29/20 0500  Weight: 106.6 kg 106.6 kg     Examination:  General exam: Appears calm and comfortable , not in any distress Respiratory system: Clear to auscultation. Respiratory effort normal. Cardiovascular system: S1 & S2 heard, RRR. No JVD, murmurs, rubs, gallops or clicks. No pedal edema. Gastrointestinal system: Abdomen is distended, soft and nontender. No organomegaly or masses felt. Normal bowel sounds heard. Central nervous system: Alert and oriented. No focal neurological deficits. Extremities: No edema, no cyanosis, no clubbing. Skin: No rashes, lesions or ulcers Psychiatry: Judgement and insight appear normal. Mood & affect appropriate.     Data Reviewed: I have personally reviewed following labs and imaging studies  CBC: Recent Labs  Lab 09/28/20 1458 09/28/20 1824 09/29/20 0500  WBC 3.5* 4.3 3.3*  NEUTROABS 3.0  --   --   HGB 7.1* 6.7* 6.1*  HCT 22.1* 20.5* 18.2*  MCV 105.7* 104.1* 101.1*  PLT 104* 110* 256*   Basic Metabolic Panel: Recent Labs  Lab 09/28/20 1458 09/28/20 1824 09/29/20 0500  NA 133* 133* 139  K 7.1* 6.8* 6.3*  CL 101 103 104  CO2 19* 19* 22  GLUCOSE 263* 216* 90  BUN 86* 88* 89*  CREATININE 9.42* 9.29* 9.59*  CALCIUM 8.4* 8.3* 8.4*   GFR: Estimated Creatinine Clearance: 8.5 mL/min (A) (by C-G formula based on SCr of 9.59 mg/dL (H)). Liver Function Tests: Recent Labs  Lab 09/28/20 1458 09/28/20 1824 09/29/20 0500  AST 16 13* 9*  ALT '31 29 26  ' ALKPHOS 68 61 57  BILITOT 0.6 0.5 0.5  PROT 6.1* 5.7* 5.2*  ALBUMIN 3.5 3.3* 2.9*   No results for input(s): LIPASE, AMYLASE in the last 168 hours. No results for input(s): AMMONIA in the last 168 hours. Coagulation Profile: Recent Labs  Lab 09/28/20 1824  INR 1.2   Cardiac Enzymes: No results for input(s): CKTOTAL, CKMB, CKMBINDEX, TROPONINI in the last 168 hours. BNP (last 3 results) No results for input(s): PROBNP in the last 8760 hours. HbA1C: Recent Labs    09/29/20 0515  HGBA1C 9.3*   CBG: Recent Labs   Lab 09/29/20 0033 09/29/20 0730 09/29/20 0749 09/29/20 1306 09/29/20 1322  GLUCAP 118* 59* 110* 58* 100*   Lipid Profile: No results for input(s): CHOL, HDL, LDLCALC, TRIG, CHOLHDL, LDLDIRECT in the last 72 hours. Thyroid Function Tests: No results for input(s): TSH, T4TOTAL, FREET4, T3FREE, THYROIDAB in the last 72 hours. Anemia Panel: No results for input(s): VITAMINB12, FOLATE, FERRITIN, TIBC, IRON, RETICCTPCT in the last 72 hours. Sepsis Labs: No results for input(s): PROCALCITON, LATICACIDVEN in the last 168 hours.  Recent Results (from the past 240 hour(s))  Resp Panel by RT-PCR (Flu A&B, Covid) Nasopharyngeal Swab     Status: None   Collection Time: 09/28/20  6:35 PM   Specimen: Nasopharyngeal Swab; Nasopharyngeal(NP) swabs in vial transport medium  Result Value Ref Range Status   SARS Coronavirus 2 by RT PCR NEGATIVE NEGATIVE Final    Comment: (NOTE) SARS-CoV-2 target nucleic acids are NOT DETECTED.  The SARS-CoV-2 RNA is generally detectable in upper respiratory specimens during the acute  phase of infection. The lowest concentration of SARS-CoV-2 viral copies this assay can detect is 138 copies/mL. A negative result does not preclude SARS-Cov-2 infection and should not be used as the sole basis for treatment or other patient management decisions. A negative result may occur with  improper specimen collection/handling, submission of specimen other than nasopharyngeal swab, presence of viral mutation(s) within the areas targeted by this assay, and inadequate number of viral copies(<138 copies/mL). A negative result must be combined with clinical observations, patient history, and epidemiological information. The expected result is Negative.  Fact Sheet for Patients:  EntrepreneurPulse.com.au  Fact Sheet for Healthcare Providers:  IncredibleEmployment.be  This test is no t yet approved or cleared by the [place] FDA and   has been authorized for detection and/or diagnosis of SARS-CoV-2 by FDA under an Emergency Use Authorization (EUA). This EUA will remain  in effect (meaning this test can be used) for the duration of the COVID-19 declaration under Section 564(b)(1) of the Act, 21 U.S.C.section 360bbb-3(b)(1), unless the authorization is terminated  or revoked sooner.       Influenza A by PCR NEGATIVE NEGATIVE Final   Influenza B by PCR NEGATIVE NEGATIVE Final    Comment: (NOTE) The Xpert Xpress SARS-CoV-2/FLU/RSV plus assay is intended as an aid in the diagnosis of influenza from Nasopharyngeal swab specimens and should not be used as a sole basis for treatment. Nasal washings and aspirates are unacceptable for Xpert Xpress SARS-CoV-2/FLU/RSV testing.  Fact Sheet for Patients: EntrepreneurPulse.com.au  Fact Sheet for Healthcare Providers: IncredibleEmployment.be  This test is not yet approved or cleared by the [place] FDA and has been authorized for detection and/or diagnosis of SARS-CoV-2 by FDA under an Emergency Use Authorization (EUA). This EUA will remain in effect (meaning this test can be used) for the duration of the COVID-19 declaration under Section 564(b)(1) of the Act, 21 U.S.C. section 360bbb-3(b)(1), unless the authorization is terminated or revoked.  Performed at [facility], [address], [place], [place] [address]     Radiology Studies: US RENAL  Result Date: 09/28/2020 CLINICAL DATA:  69 year old male with acute renal insufficiency. Prior right nephrectomy. EXAM: RENAL / URINARY TRACT ULTRASOUND COMPLETE COMPARISON:  None. FINDINGS: Right Kidney: Nephrectomy. Left Kidney: Renal measurements: 12.2 x 6.1 x 5.3 cm = volume: 207 mL. Normal echogenicity. No hydronephrosis or shadowing stone. There is an 8.3 x 6.8 x 8.8 cm left renal inferior pole cyst. Bladder: Appears normal for degree of bladder distention. Other: There is diffuse  increased liver echogenicity most commonly seen in the setting of fatty infiltration. Superimposed inflammation or fibrosis is not excluded. Clinical correlation is recommended. IMPRESSION: 1. Prior right nephrectomy. 2. Large left renal inferior pole cyst. No hydronephrosis or shadowing stone. Electronically Signed   By: Anner Crete M.D.   On: 09/28/2020 23:48   DG Chest Port 1 View  Result Date: 09/28/2020 CLINICAL DATA:  69 year old male with shortness of breath. EXAM: PORTABLE CHEST 1 VIEW COMPARISON:  Chest radiograph dated 05/18/2020. FINDINGS: Right-sided Port-A-Cath in similar position. There is diffuse interstitial coarsening and mild chronic bronchitic changes. There are bibasilar atelectasis/scarring. No focal consolidation, pleural effusion, pneumothorax. Stable cardiac silhouette. No acute osseous pathology. Old rib fractures. IMPRESSION: No acute cardiopulmonary process. Electronically Signed   By: Anner Crete M.D.   On: 09/28/2020 18:30   Scheduled Meds: . heparin sodium (porcine)      . lidocaine-EPINEPHrine      . sodium chloride   Intravenous Once  .  alfuzosin  10 mg Oral Q breakfast  . Chlorhexidine Gluconate Cloth  6 each Topical Daily  . furosemide  80 mg Intravenous Q6H  . heparin injection (subcutaneous)  5,000 Units Subcutaneous Q8H  . insulin aspart  0-9 Units Subcutaneous TID WC  . insulin detemir  10 Units Subcutaneous QHS  . pravastatin  20 mg Oral QHS  . sodium bicarbonate  1,300 mg Oral BID  . sodium zirconium cyclosilicate  10 g Oral TID  . traZODone  100 mg Oral QHS   Continuous Infusions:   LOS: 1 day    Time spent: 35 mins    Quanesha Klimaszewski, MD [facility]   If 7PM-7AM, please contact night-coverage

## 2020-09-29 NOTE — Telephone Encounter (Signed)
Patient received medication on 09/28/20 from [facility].  [place] [facility] [place] Phone [phone] Fax [phone] 09/29/2020 8:45 AM

## 2020-09-30 ENCOUNTER — Other Ambulatory Visit: Payer: Self-pay | Admitting: Hematology and Oncology

## 2020-09-30 ENCOUNTER — Other Ambulatory Visit (HOSPITAL_COMMUNITY): Payer: Self-pay | Admitting: Hematology

## 2020-09-30 LAB — HEPATIC FUNCTION PANEL
ALT: 28 U/L (ref 0–44)
AST: 14 U/L — ABNORMAL LOW (ref 15–41)
Albumin: 2.9 g/dL — ABNORMAL LOW (ref 3.5–5.0)
Alkaline Phosphatase: 50 U/L (ref 38–126)
Bilirubin, Direct: <0.1 mg/dL (ref 0.0–0.2)
Total Bilirubin: 0.8 mg/dL (ref 0.3–1.2)
Total Protein: 4.9 g/dL — ABNORMAL LOW (ref 6.5–8.1)

## 2020-09-30 LAB — TYPE AND SCREEN
ABO/RH(D): A POS
Antibody Screen: POSITIVE
Unit division: 0
Unit division: 0

## 2020-09-30 LAB — RENAL FUNCTION PANEL
Albumin: 2.8 g/dL — ABNORMAL LOW (ref 3.5–5.0)
Anion gap: 15 (ref 5–15)
BUN: 99 mg/dL — ABNORMAL HIGH (ref 8–23)
CO2: 23 mmol/L (ref 22–32)
Calcium: 8 mg/dL — ABNORMAL LOW (ref 8.9–10.3)
Chloride: 100 mmol/L (ref 98–111)
Creatinine, Ser: 10.3 mg/dL — ABNORMAL HIGH (ref 0.61–1.24)
GFR, Estimated: 5 mL/min — ABNORMAL LOW (ref >60)
Glucose, Bld: 87 mg/dL (ref 70–99)
Phosphorus: 8.4 mg/dL — ABNORMAL HIGH (ref 2.5–4.6)
Potassium: 5.6 mmol/L — ABNORMAL HIGH (ref 3.5–5.1)
Sodium: 138 mmol/L (ref 135–145)

## 2020-09-30 LAB — BPAM RBC
Blood Product Expiration Date: 202112142359
Blood Product Expiration Date: 202112232359
ISSUE DATE / TIME: 202112021055
ISSUE DATE / TIME: 202112021744
Unit Type and Rh: 6200
Unit Type and Rh: 6200

## 2020-09-30 LAB — HEMOGLOBIN AND HEMATOCRIT, BLOOD
HCT: 21.9 % — ABNORMAL LOW (ref 39.0–52.0)
Hemoglobin: 7.4 g/dL — ABNORMAL LOW (ref 13.0–17.0)

## 2020-09-30 LAB — PROTEIN ELECTROPHORESIS, SERUM
A/G Ratio: 1.3 (ref 0.7–1.7)
Albumin ELP: 3.1 g/dL (ref 2.9–4.4)
Alpha-1-Globulin: 0.2 g/dL (ref 0.0–0.4)
Alpha-2-Globulin: 0.8 g/dL (ref 0.4–1.0)
Beta Globulin: 0.8 g/dL (ref 0.7–1.3)
Gamma Globulin: 0.5 g/dL (ref 0.4–1.8)
Globulin, Total: 2.4 g/dL (ref 2.2–3.9)
M-Spike, %: 0.2 g/dL — ABNORMAL HIGH
Total Protein ELP: 5.5 g/dL — ABNORMAL LOW (ref 6.0–8.5)

## 2020-09-30 LAB — CBC
HCT: 22.1 % — ABNORMAL LOW (ref 39.0–52.0)
Hemoglobin: 7.5 g/dL — ABNORMAL LOW (ref 13.0–17.0)
MCH: 32.8 pg (ref 26.0–34.0)
MCHC: 33.9 g/dL (ref 30.0–36.0)
MCV: 96.5 fL (ref 80.0–100.0)
Platelets: 106 10*3/uL — ABNORMAL LOW (ref 150–400)
RBC: 2.29 MIL/uL — ABNORMAL LOW (ref 4.22–5.81)
RDW: 18.8 % — ABNORMAL HIGH (ref 11.5–15.5)
WBC: 3.9 10*3/uL — ABNORMAL LOW (ref 4.0–10.5)
nRBC: 0 % (ref 0.0–0.2)

## 2020-09-30 LAB — KAPPA/LAMBDA LIGHT CHAINS
Kappa free light chain: 5360.7 mg/L — ABNORMAL HIGH (ref 3.3–19.4)
Kappa, lambda light chain ratio: 687.27 — ABNORMAL HIGH (ref 0.26–1.65)
Lambda free light chains: 7.8 mg/L (ref 5.7–26.3)

## 2020-09-30 LAB — HEPATITIS B CORE ANTIBODY, TOTAL: Hep B Core Total Ab: NONREACTIVE

## 2020-09-30 LAB — GLUCOSE, CAPILLARY
Glucose-Capillary: 84 mg/dL (ref 70–99)
Glucose-Capillary: 166 mg/dL — ABNORMAL HIGH (ref 70–99)
Glucose-Capillary: 114 mg/dL — ABNORMAL HIGH (ref 70–99)

## 2020-09-30 LAB — MAGNESIUM: Magnesium: 1.7 mg/dL (ref 1.7–2.4)

## 2020-09-30 LAB — HEPATITIS B SURFACE ANTIGEN: Hepatitis B Surface Ag: NONREACTIVE

## 2020-09-30 LAB — HEPATITIS B SURFACE ANTIBODY,QUALITATIVE: Hep B S Ab: NONREACTIVE

## 2020-09-30 MED ORDER — BORTEZOMIB CHEMO SQ INJECTION 3.5 MG (2.5MG/ML)
1.3000 mg/m2 | Freq: Once | INTRAMUSCULAR | Status: AC
  Administered 2020-10-01: 3 mg via SUBCUTANEOUS
  Filled 2020-09-30: qty 1.2

## 2020-09-30 MED ORDER — PENTAFLUOROPROP-TETRAFLUOROETH EX AERO: 1.0000 "application " | INHALATION_SPRAY | CUTANEOUS | Status: DC | PRN

## 2020-09-30 MED ORDER — DEXAMETHASONE 6 MG PO TABS
20.0000 mg | ORAL_TABLET | Freq: Once | ORAL | Status: AC
  Administered 2020-10-01: 20 mg via ORAL
  Filled 2020-09-30: qty 1

## 2020-09-30 MED ORDER — HEPARIN SODIUM (PORCINE) 1000 UNIT/ML DIALYSIS: 1000.0000 [IU] | INTRAMUSCULAR | Status: DC | PRN

## 2020-09-30 MED ORDER — SODIUM CHLORIDE 0.9 % IV SOLN: 100.0000 mL | INTRAVENOUS | Status: DC | PRN

## 2020-09-30 MED ORDER — LIDOCAINE-PRILOCAINE 2.5-2.5 % EX CREA: 1.0000 "application " | TOPICAL_CREAM | CUTANEOUS | Status: DC | PRN

## 2020-09-30 MED ORDER — CHLORHEXIDINE GLUCONATE CLOTH 2 % EX PADS: 6.0000 | MEDICATED_PAD | Freq: Every day | CUTANEOUS | Status: DC

## 2020-09-30 MED ORDER — ALTEPLASE 2 MG IJ SOLR: 2.0000 mg | Freq: Once | INTRAMUSCULAR | Status: DC | PRN

## 2020-09-30 MED ORDER — LIDOCAINE HCL (PF) 1 % IJ SOLN: 5.0000 mL | INTRAMUSCULAR | Status: DC | PRN

## 2020-09-30 MED ORDER — HEPARIN SODIUM (PORCINE) 1000 UNIT/ML IJ SOLN
INTRAMUSCULAR | Status: AC
  Administered 2020-09-30: 2800 [IU] via INTRAVENOUS_CENTRAL
  Filled 2020-09-30: qty 4

## 2020-09-30 NOTE — Progress Notes (Signed)
Patient ID: Tom Marshall, male   DOB: 1951-05-20, 69 y.o.   MRN: [id number] S: Feels better today.  Seen and examined while on HD.  No complaints O:BP 115/64 (BP Location: Left Arm)   Pulse 77   Temp 98.4 F (36.9 C) (Oral)   Resp 18   Ht 5' 7.99" (1.727 m)   Wt 109.3 kg   SpO2 99%   BMI 36.65 kg/m   Intake/Output Summary (Last 24 hours) at 09/30/2020 1046 Last data filed at 09/29/2020 2130 Gross per 24 hour  Intake 1041.33 ml  Output 750 ml  Net 291.33 ml   Intake/Output: I/O last 3 completed shifts: In: 2538.3 [P.O.:922; I.V.:1126.3; Blood:440; IV Piggyback:50] Out: 1400 [OPFYT:2446]  Intake/Output this shift:  No intake/output data recorded. Weight change: 2.722 kg Gen: NAD CVS:RRR Resp: CTA Abd: distended, +BS, soft Ext: 1+ BLE edema  Recent Labs  Lab 09/28/20 1458 09/28/20 1824 09/29/20 0500 09/29/20 1540 09/30/20 0530  NA 133* 133* 139 137 138  K 7.1* 6.8* 6.3* 5.5* 5.6*  CL 101 103 104 100 100  CO2 19* 19* 22 21* 23  GLUCOSE 263* 216* 90 172* 87  BUN 86* 88* 89* 87* 99*  CREATININE 9.42* 9.29* 9.59* 9.59* 10.30*  ALBUMIN 3.5 3.3* 2.9* 2.9* 2.9*  2.8*  CALCIUM 8.4* 8.3* 8.4* 8.3* 8.0*  PHOS  --   --   --  7.3* 8.4*  AST 16 13* 9*  --  14*  ALT _0 --  28   Liver Function Tests: Recent Labs  Lab 09/28/20 1824 09/28/20 1824 09/29/20 0500 09/29/20 1540 09/30/20 0530  AST 13*  --  9*  --  14*  ALT 29  --  26  --  28  ALKPHOS 61  --  57  --  50  BILITOT 0.5  --  0.5  --  0.8  PROT 5.7*  --  5.2*  --  4.9*  ALBUMIN 3.3*   < > 2.9* 2.9* 2.9*  2.8*   < > = values in this interval not displayed.   No results for input(s): LIPASE, AMYLASE in the last 168 hours. No results for input(s): AMMONIA in the last 168 hours. CBC: Recent Labs  Lab 09/28/20 1458 09/28/20 1458 09/28/20 1824 09/28/20 1824 09/29/20 0500 09/29/20 0500 09/29/20 1540 09/29/20 2330 09/30/20 0530  WBC 3.5*   < > 4.3  --  3.3*  --   --   --  3.9*  NEUTROABS 3.0  --    --   --   --   --   --   --   --   HGB 7.1*   < > 6.7*   < > 6.1*   < > 6.6* 7.4* 7.5*  HCT 22.1*   < > 20.5*   < > 18.2*   < > 20.1* 21.9* 22.1*  MCV 105.7*  --  104.1*  --  101.1*  --   --   --  96.5  PLT 104*   < > 110*  --  104*  --   --   --  106*   < > = values in this interval not displayed.   Cardiac Enzymes: No results for input(s): CKTOTAL, CKMB, CKMBINDEX, TROPONINI in the last 168 hours. CBG: Recent Labs  Lab 09/29/20 1322 09/29/20 1621 09/29/20 2220 09/29/20 2304 09/30/20 0900  GLUCAP 100* 148* 67* 84 84    Iron Studies: No results for input(s): IRON, TIBC, TRANSFERRIN, FERRITIN in  the last 72 hours. Studies/Results: US RENAL  Result Date: 09/28/2020 CLINICAL DATA:  69 year old male with acute renal insufficiency. Prior right nephrectomy. EXAM: RENAL / URINARY TRACT ULTRASOUND COMPLETE COMPARISON:  None. FINDINGS: Right Kidney: Nephrectomy. Left Kidney: Renal measurements: 12.2 x 6.1 x 5.3 cm = volume: 207 mL. Normal echogenicity. No hydronephrosis or shadowing stone. There is an 8.3 x 6.8 x 8.8 cm left renal inferior pole cyst. Bladder: Appears normal for degree of bladder distention. Other: There is diffuse increased liver echogenicity most commonly seen in the setting of fatty infiltration. Superimposed inflammation or fibrosis is not excluded. Clinical correlation is recommended. IMPRESSION: 1. Prior right nephrectomy. 2. Large left renal inferior pole cyst. No hydronephrosis or shadowing stone. Electronically Signed   By: Anner Crete M.D.   On: 09/28/2020 23:48   IR Fluoro Guide CV Line Left  Result Date: 09/29/2020 INDICATION: Acute renal insufficiency. Please perform image guided placement of temporary dialysis catheter for the initiation dialysis Note, the decision was made to proceed with placement of a temporary dialysis catheter as there is hope the patient will only temporary hemodialysis per discussion with the providing nephrology service. EXAM:  NON-TUNNELED CENTRAL VENOUS HEMODIALYSIS CATHETER PLACEMENT WITH ULTRASOUND AND FLUOROSCOPIC GUIDANCE COMPARISON:  None. MEDICATIONS: None FLUOROSCOPY TIME:  36 seconds (10 mGy) COMPLICATIONS: None immediate. PROCEDURE: Informed written consent was obtained from the patient after a discussion of the risks, benefits, and alternatives to treatment. Questions regarding the procedure were encouraged and answered. Given the presence of the right internal jugular approach port a catheter decision was made to place a left internal jugular approach temporary dialysis catheter As such the left neck and chest were prepped with chlorhexidine in a sterile fashion, and a sterile drape was applied covering the operative field. Maximum barrier sterile technique with sterile gowns and gloves were used for the procedure. A timeout was performed prior to the initiation of the procedure. After the overlying soft tissues were anesthetized, a small venotomy incision was created and a micropuncture kit was utilized to access the internal jugular vein. Real-time ultrasound guidance was utilized for vascular access including the acquisition of a permanent ultrasound image documenting patency of the accessed vessel. The microwire was utilized to measure appropriate catheter length. A stiff glidewire was advanced to the level of the IVC. Under fluoroscopic guidance, the venotomy was serially dilated, ultimately allowing placement of a 20 cm temporary Mahurkar catheter with tip ultimately terminating within the superior aspect of the right atrium. Final catheter positioning was confirmed and documented with a spot radiographic image. The catheter aspirates and flushes normally. The catheter was flushed with appropriate volume heparin dwells. The catheter exit site was secured with a 0-Prolene retention suture. A dressing was placed. The patient tolerated the procedure well without immediate post procedural complication. IMPRESSION: Successful  placement of a left internal jugular approach 20 cm temporary dialysis catheter with tip terminating with in the superior aspect of the right atrium. The catheter is ready for immediate use. PLAN: This catheter may be converted to a tunneled dialysis catheter at a later date as indicated. Electronically Signed   By: Sandi Mariscal M.D.   On: 09/29/2020 15:47   IR US Guide Vasc Access Left  Result Date: 09/29/2020 INDICATION: Acute renal insufficiency. Please perform image guided placement of temporary dialysis catheter for the initiation dialysis Note, the decision was made to proceed with placement of a temporary dialysis catheter as there is hope the patient will only temporary hemodialysis per discussion with  the providing nephrology service. EXAM: NON-TUNNELED CENTRAL VENOUS HEMODIALYSIS CATHETER PLACEMENT WITH ULTRASOUND AND FLUOROSCOPIC GUIDANCE COMPARISON:  None. MEDICATIONS: None FLUOROSCOPY TIME:  36 seconds (10 mGy) COMPLICATIONS: None immediate. PROCEDURE: Informed written consent was obtained from the patient after a discussion of the risks, benefits, and alternatives to treatment. Questions regarding the procedure were encouraged and answered. Given the presence of the right internal jugular approach port a catheter decision was made to place a left internal jugular approach temporary dialysis catheter As such the left neck and chest were prepped with chlorhexidine in a sterile fashion, and a sterile drape was applied covering the operative field. Maximum barrier sterile technique with sterile gowns and gloves were used for the procedure. A timeout was performed prior to the initiation of the procedure. After the overlying soft tissues were anesthetized, a small venotomy incision was created and a micropuncture kit was utilized to access the internal jugular vein. Real-time ultrasound guidance was utilized for vascular access including the acquisition of a permanent ultrasound image documenting patency  of the accessed vessel. The microwire was utilized to measure appropriate catheter length. A stiff glidewire was advanced to the level of the IVC. Under fluoroscopic guidance, the venotomy was serially dilated, ultimately allowing placement of a 20 cm temporary Mahurkar catheter with tip ultimately terminating within the superior aspect of the right atrium. Final catheter positioning was confirmed and documented with a spot radiographic image. The catheter aspirates and flushes normally. The catheter was flushed with appropriate volume heparin dwells. The catheter exit site was secured with a 0-Prolene retention suture. A dressing was placed. The patient tolerated the procedure well without immediate post procedural complication. IMPRESSION: Successful placement of a left internal jugular approach 20 cm temporary dialysis catheter with tip terminating with in the superior aspect of the right atrium. The catheter is ready for immediate use. PLAN: This catheter may be converted to a tunneled dialysis catheter at a later date as indicated. Electronically Signed   By: Sandi Mariscal M.D.   On: 09/29/2020 15:47   DG Chest Port 1 View  Result Date: 09/28/2020 CLINICAL DATA:  69 year old male with shortness of breath. EXAM: PORTABLE CHEST 1 VIEW COMPARISON:  Chest radiograph dated 05/18/2020. FINDINGS: Right-sided Port-A-Cath in similar position. There is diffuse interstitial coarsening and mild chronic bronchitic changes. There are bibasilar atelectasis/scarring. No focal consolidation, pleural effusion, pneumothorax. Stable cardiac silhouette. No acute osseous pathology. Old rib fractures. IMPRESSION: No acute cardiopulmonary process. Electronically Signed   By: Anner Crete M.D.   On: 09/28/2020 18:30   . sodium chloride   Intravenous Once  . alfuzosin  10 mg Oral Q breakfast  . bortezomib SQ  1.3 mg/m2 Subcutaneous Once  . Chlorhexidine Gluconate Cloth  6 each Topical Daily  . Chlorhexidine Gluconate Cloth   6 each Topical Q0600  . furosemide  80 mg Intravenous Q6H  . heparin injection (subcutaneous)  5,000 Units Subcutaneous Q8H  . insulin aspart  0-9 Units Subcutaneous TID WC  . insulin detemir  10 Units Subcutaneous QHS  . pravastatin  20 mg Oral QHS  . sodium bicarbonate  1,300 mg Oral BID  . sodium zirconium cyclosilicate  10 g Oral TID  . traZODone  100 mg Oral QHS    BMET    Component Value Date/Time   NA 138 09/30/2020 0530   K 5.6 (H) 09/30/2020 0530   CL 100 09/30/2020 0530   CO2 23 09/30/2020 0530   GLUCOSE 87 09/30/2020 0530   BUN 99 (  H) 09/30/2020 0530   CREATININE 10.30 (H) 09/30/2020 0530   CALCIUM 8.0 (L) 09/30/2020 0530   CALCIUM 10.7 (H) 07/25/2017 0447   GFRNONAA 5 (L) 09/30/2020 0530   GFRAA 30 (L) 07/25/2020 0911   CBC    Component Value Date/Time   WBC 3.9 (L) 09/30/2020 0530   RBC 2.29 (L) 09/30/2020 0530   HGB 7.5 (L) 09/30/2020 0530   HCT 22.1 (L) 09/30/2020 0530   PLT 106 (L) 09/30/2020 0530   MCV 96.5 09/30/2020 0530   MCH 32.8 09/30/2020 0530   MCHC 33.9 09/30/2020 0530   RDW 18.8 (H) 09/30/2020 0530   LYMPHSABS 0.3 (L) 09/28/2020 1458   MONOABS 0.3 09/28/2020 1458   EOSABS 0.0 09/28/2020 1458   BASOSABS 0.0 09/28/2020 1458     Assessment/Plan:  1. AKI/CKD stage IV- pt with solitary kidney s/p right Nx due to [facility] and ongoing treatment for multiple myeloma.  Scr jumped from baseline of 2.78 on 11/22 up to 9.42 on 09/28/20.  He had been on lisinopril 5 mg daily prior to admission and did have some diarrhea as well as progressive increased lower extremity edema.   1. No significant improvement of UOP or BUN/Cr with IV lasix and persistent hyperkalemia.  Will initiate HD today and again tomorrow and follow UOP and Scr for renal recovery 2. 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 3. LIJ temp HD catheter placed by IR on 09/29/20 2. Hyperkalemia- improving with lokelma and IV  lasix 3. Anasarca/volume overload- unclear if he has a history of CHF but low albumin and AKI likely contributing as well as myeloma.   4. HTN 5. Multiple myeloma- currently receiving chemo at the cancer center at [facility]. 6. Anemia- acute on chronic - s/p blood transfusion this morning.  Continue to follow   Donetta Potts, MD [facility] [phone]

## 2020-09-30 NOTE — Progress Notes (Signed)
PROGRESS NOTE    Tom Marshall  RJJ:[id number] DOB: 1951-10-13 DOA: 09/28/2020 PCP: Asencion Noble, MD   Brief Narrative:  This 69 years old male with PMH of renal cell cancer status post nephrectomy 1998 currently with solitary kidney, CKD stage IV with baseline creatinine between 2.5-2.7, diabetes mellitus, hypertension, hyperlipidemia,  multiple myeloma, actively undergoing treatment at [facility] with Dr. Delton Coombes presented to the emergency department with acute shortness of breath .  He was called by his oncologist to go to the emergency department because of worsening renal functions and hyperkalemia. In the ED his blood work shows a potassium 6.8, bicarb 19, BUN 88, creatinine 9.29, BNP 325, hemoglobin 6.7. Nephrology was consulted,  recommended temporary hemodialysis.  Hematology was consulted.  Patient underwent hemodialysis today feels better.   Assessment & Plan:   Active Problems:   AKI (acute kidney injury) ([place])   Acute kidney injury on chronic kidney disease stage IV with hyperkalemia: Baseline creatinine around 2.4, currently at 9.   On-call nephrology, Dr. Moshe Cipro recommended transfer to [facility]. Admitted to stepdown EKG does not show changes of hyperkalemia,  patient was given dextrose and insulin, calcium, as well as has been placed on scheduled Lokelma -Is possible that this is a myeloma kidney given recent concern as an outpatient at the street light chains were increasing.   Obtain light chains, LDH, SPEP,  -Nephrology recommended temporary hemodialysis. -Patient underwent temporary tunneled catheter for hemodialysis by IR -Serum potassium is improving with scheduled Lokelma. -Patient underwent hemodialysis today.  Next hemodialysis 12/4  Type 2 diabetes mellitus -Resume long-acting insulin along with sliding scale, reduced doses due to renal failure.  Multiple myeloma -Case was discussed over the phone with Dr. Delton Coombes who recommended  blood work as above. -Oncology consulted, outpatient chemotherapy for multiple myeloma scheduled in [place].  Essential hypertension -Hold lisinopril   Hyperlipidemia -continue statin  Symptomatic anemia, thrombocytopenia -Likely in the setting of underlying malignancy, transfuse unit of packed red blood cells. Transfuse less compatible PRBC, keep hemoglobin above 7. He is s/p 2 PRBCs posttransfusion hemoglobin 7.5.  History of DVT -Patient reports DVT several years ago, has been on Eliquis but only takes it 2.5 mg once daily.   Given that he may need procedures in the next day or so, acute worsening of his renal function hold in place on heparin s.q.    DVT prophylaxis: Heparin subcu Code Status: Full code Family Communication: No one at bedside Disposition Plan:   Status is: Inpatient  Remains inpatient appropriate because:Inpatient level of care appropriate due to severity of illness   Dispo: The patient is from: Home              Anticipated d/c is to: Home              Anticipated d/c date is: 3 days              Patient currently is not medically stable to d/c.   Consultants:   Nephrology, interventional radiology, oncology  Procedures: Temporary hemodialysis catheter Antimicrobials: Anti-infectives (From admission, onward)   None      Subjective: Patient was seen and examined at bedside.  Overnight events noted.  He denies any shortness of breath.   He reports feeling better. He is lying comfortably on the bed, his belly is distended.  Objective: Vitals:   09/30/20 1200 09/30/20 1230 09/30/20 1255 09/30/20 1445  BP: 127/61 109/65 128/63 (!) 135/55  Pulse:    86  Resp: _0 Temp:   98.2 F (36.8 C) 98.5 F (36.9 C)  TempSrc:   Oral Oral  SpO2:   98% 99%  Weight:   107.1 kg   Height:        Intake/Output Summary (Last 24 hours) at 09/30/2020 1452 Last data filed at 09/30/2020 1451 Gross per 24 hour  Intake 981.33 ml  Output 1500  ml  Net -518.67 ml   Filed Weights   09/30/20 0438 09/30/20 1015 09/30/20 1255  Weight: 109.3 kg 108.6 kg 107.1 kg    Examination:  General exam: Appears calm and comfortable , not in any distress Respiratory system: Clear to auscultation. Respiratory effort normal. Cardiovascular system: S1 & S2 heard, RRR. No JVD, murmurs, rubs, gallops or clicks. No pedal edema. Gastrointestinal system: Abdomen is distended, soft and nontender. No organomegaly or masses felt. Normal bowel sounds heard. Central nervous system: Alert and oriented. No focal neurological deficits. Extremities: No edema, no cyanosis, no clubbing. Skin: No rashes, lesions or ulcers Psychiatry: Judgement and insight appear normal. Mood & affect appropriate.     Data Reviewed: I have personally reviewed following labs and imaging studies  CBC: Recent Labs  Lab 09/28/20 1458 09/28/20 1458 09/28/20 1824 09/29/20 0500 09/29/20 1540 09/29/20 2330 09/30/20 0530  WBC 3.5*  --  4.3 3.3*  --   --  3.9*  NEUTROABS 3.0  --   --   --   --   --   --   HGB 7.1*   < > 6.7* 6.1* 6.6* 7.4* 7.5*  HCT 22.1*   < > 20.5* 18.2* 20.1* 21.9* 22.1*  MCV 105.7*  --  104.1* 101.1*  --   --  96.5  PLT 104*  --  110* 104*  --   --  106*   < > = values in this interval not displayed.   Basic Metabolic Panel: Recent Labs  Lab 09/28/20 1458 09/28/20 1824 09/29/20 0500 09/29/20 1540 09/30/20 0530  NA 133* 133* 139 137 138  K 7.1* 6.8* 6.3* 5.5* 5.6*  CL 101 103 104 100 100  CO2 19* 19* 22 21* 23  GLUCOSE 263* 216* 90 172* 87  BUN 86* 88* 89* 87* 99*  CREATININE 9.42* 9.29* 9.59* 9.59* 10.30*  CALCIUM 8.4* 8.3* 8.4* 8.3* 8.0*  MG  --   --   --   --  1.7  PHOS  --   --   --  7.3* 8.4*   GFR: Estimated Creatinine Clearance: 7.9 mL/min (A) (by C-G formula based on SCr of 10.3 mg/dL (H)). Liver Function Tests: Recent Labs  Lab 09/28/20 1458 09/28/20 1824 09/29/20 0500 09/29/20 1540 09/30/20 0530  AST 16 13* 9*  --  14*    ALT _1 --  28  ALKPHOS 68 61 57  --  50  BILITOT 0.6 0.5 0.5  --  0.8  PROT 6.1* 5.7* 5.2*  --  4.9*  ALBUMIN 3.5 3.3* 2.9* 2.9* 2.9*  2.8*   No results for input(s): LIPASE, AMYLASE in the last 168 hours. No results for input(s): AMMONIA in the last 168 hours. Coagulation Profile: Recent Labs  Lab 09/28/20 1824  INR 1.2   Cardiac Enzymes: No results for input(s): CKTOTAL, CKMB, CKMBINDEX, TROPONINI in the last 168 hours. BNP (last 3 results) No results for input(s): PROBNP in the last 8760 hours. HbA1C: Recent Labs    09/29/20 0515  HGBA1C 9.3*   CBG: Recent Labs  Lab 09/29/20 1322 09/29/20 1621 09/29/20 2220 09/29/20 2304 09/30/20 0900  GLUCAP 100* 148* 67* 84 84   Lipid Profile: No results for input(s): CHOL, HDL, LDLCALC, TRIG, CHOLHDL, LDLDIRECT in the last 72 hours. Thyroid Function Tests: No results for input(s): TSH, T4TOTAL, FREET4, T3FREE, THYROIDAB in the last 72 hours. Anemia Panel: No results for input(s): VITAMINB12, FOLATE, FERRITIN, TIBC, IRON, RETICCTPCT in the last 72 hours. Sepsis Labs: No results for input(s): PROCALCITON, LATICACIDVEN in the last 168 hours.  Recent Results (from the past 240 hour(s))  Resp Panel by RT-PCR (Flu A&B, Covid) Nasopharyngeal Swab     Status: None   Collection Time: 09/28/20  6:35 PM   Specimen: Nasopharyngeal Swab; Nasopharyngeal(NP) swabs in vial transport medium  Result Value Ref Range Status   SARS Coronavirus 2 by RT PCR NEGATIVE NEGATIVE Final    Comment: (NOTE) SARS-CoV-2 target nucleic acids are NOT DETECTED.  The SARS-CoV-2 RNA is generally detectable in upper respiratory specimens during the acute phase of infection. The lowest concentration of SARS-CoV-2 viral copies this assay can detect is 138 copies/mL. A negative result does not preclude SARS-Cov-2 infection and should not be used as the sole basis for treatment or other patient management decisions. A negative result may occur with   improper specimen collection/handling, submission of specimen other than nasopharyngeal swab, presence of viral mutation(s) within the areas targeted by this assay, and inadequate number of viral copies(<138 copies/mL). A negative result must be combined with clinical observations, patient history, and epidemiological information. The expected result is Negative.  Fact Sheet for Patients:  EntrepreneurPulse.com.au  Fact Sheet for Healthcare Providers:  IncredibleEmployment.be  This test is no t yet approved or cleared by the [place] FDA and  has been authorized for detection and/or diagnosis of SARS-CoV-2 by FDA under an Emergency Use Authorization (EUA). This EUA will remain  in effect (meaning this test can be used) for the duration of the COVID-19 declaration under Section 564(b)(1) of the Act, 21 U.S.C.section 360bbb-3(b)(1), unless the authorization is terminated  or revoked sooner.       Influenza A by PCR NEGATIVE NEGATIVE Final   Influenza B by PCR NEGATIVE NEGATIVE Final    Comment: (NOTE) The Xpert Xpress SARS-CoV-2/FLU/RSV plus assay is intended as an aid in the diagnosis of influenza from Nasopharyngeal swab specimens and should not be used as a sole basis for treatment. Nasal washings and aspirates are unacceptable for Xpert Xpress SARS-CoV-2/FLU/RSV testing.  Fact Sheet for Patients: EntrepreneurPulse.com.au  Fact Sheet for Healthcare Providers: IncredibleEmployment.be  This test is not yet approved or cleared by the [place] FDA and has been authorized for detection and/or diagnosis of SARS-CoV-2 by FDA under an Emergency Use Authorization (EUA). This EUA will remain in effect (meaning this test can be used) for the duration of the COVID-19 declaration under Section 564(b)(1) of the Act, 21 U.S.C. section 360bbb-3(b)(1), unless the authorization is terminated  or revoked.  Performed at [facility], [address] [place], [place] [address]     Radiology Studies: US RENAL  Result Date: 09/28/2020 CLINICAL DATA:  69 year old male with acute renal insufficiency. Prior right nephrectomy. EXAM: RENAL / URINARY TRACT ULTRASOUND COMPLETE COMPARISON:  None. FINDINGS: Right Kidney: Nephrectomy. Left Kidney: Renal measurements: 12.2 x 6.1 x 5.3 cm = volume: 207 mL. Normal echogenicity. No hydronephrosis or shadowing stone. There is an 8.3 x 6.8 x 8.8 cm left renal inferior pole cyst. Bladder: Appears normal for degree of bladder distention. Other: There is  diffuse increased liver echogenicity most commonly seen in the setting of fatty infiltration. Superimposed inflammation or fibrosis is not excluded. Clinical correlation is recommended. IMPRESSION: 1. Prior right nephrectomy. 2. Large left renal inferior pole cyst. No hydronephrosis or shadowing stone. Electronically Signed   By: Anner Crete M.D.   On: 09/28/2020 23:48   IR Fluoro Guide CV Line Left  Result Date: 09/29/2020 INDICATION: Acute renal insufficiency. Please perform image guided placement of temporary dialysis catheter for the initiation dialysis Note, the decision was made to proceed with placement of a temporary dialysis catheter as there is hope the patient will only temporary hemodialysis per discussion with the providing nephrology service. EXAM: NON-TUNNELED CENTRAL VENOUS HEMODIALYSIS CATHETER PLACEMENT WITH ULTRASOUND AND FLUOROSCOPIC GUIDANCE COMPARISON:  None. MEDICATIONS: None FLUOROSCOPY TIME:  36 seconds (10 mGy) COMPLICATIONS: None immediate. PROCEDURE: Informed written consent was obtained from the patient after a discussion of the risks, benefits, and alternatives to treatment. Questions regarding the procedure were encouraged and answered. Given the presence of the right internal jugular approach port a catheter decision was made to place a left internal jugular approach temporary  dialysis catheter As such the left neck and chest were prepped with chlorhexidine in a sterile fashion, and a sterile drape was applied covering the operative field. Maximum barrier sterile technique with sterile gowns and gloves were used for the procedure. A timeout was performed prior to the initiation of the procedure. After the overlying soft tissues were anesthetized, a small venotomy incision was created and a micropuncture kit was utilized to access the internal jugular vein. Real-time ultrasound guidance was utilized for vascular access including the acquisition of a permanent ultrasound image documenting patency of the accessed vessel. The microwire was utilized to measure appropriate catheter length. A stiff glidewire was advanced to the level of the IVC. Under fluoroscopic guidance, the venotomy was serially dilated, ultimately allowing placement of a 20 cm temporary Mahurkar catheter with tip ultimately terminating within the superior aspect of the right atrium. Final catheter positioning was confirmed and documented with a spot radiographic image. The catheter aspirates and flushes normally. The catheter was flushed with appropriate volume heparin dwells. The catheter exit site was secured with a 0-Prolene retention suture. A dressing was placed. The patient tolerated the procedure well without immediate post procedural complication. IMPRESSION: Successful placement of a left internal jugular approach 20 cm temporary dialysis catheter with tip terminating with in the superior aspect of the right atrium. The catheter is ready for immediate use. PLAN: This catheter may be converted to a tunneled dialysis catheter at a later date as indicated. Electronically Signed   By: Sandi Mariscal M.D.   On: 09/29/2020 15:47   IR US Guide Vasc Access Left  Result Date: 09/29/2020 INDICATION: Acute renal insufficiency. Please perform image guided placement of temporary dialysis catheter for the initiation dialysis  Note, the decision was made to proceed with placement of a temporary dialysis catheter as there is hope the patient will only temporary hemodialysis per discussion with the providing nephrology service. EXAM: NON-TUNNELED CENTRAL VENOUS HEMODIALYSIS CATHETER PLACEMENT WITH ULTRASOUND AND FLUOROSCOPIC GUIDANCE COMPARISON:  None. MEDICATIONS: None FLUOROSCOPY TIME:  36 seconds (10 mGy) COMPLICATIONS: None immediate. PROCEDURE: Informed written consent was obtained from the patient after a discussion of the risks, benefits, and alternatives to treatment. Questions regarding the procedure were encouraged and answered. Given the presence of the right internal jugular approach port a catheter decision was made to place a left internal jugular approach temporary dialysis  catheter As such the left neck and chest were prepped with chlorhexidine in a sterile fashion, and a sterile drape was applied covering the operative field. Maximum barrier sterile technique with sterile gowns and gloves were used for the procedure. A timeout was performed prior to the initiation of the procedure. After the overlying soft tissues were anesthetized, a small venotomy incision was created and a micropuncture kit was utilized to access the internal jugular vein. Real-time ultrasound guidance was utilized for vascular access including the acquisition of a permanent ultrasound image documenting patency of the accessed vessel. The microwire was utilized to measure appropriate catheter length. A stiff glidewire was advanced to the level of the IVC. Under fluoroscopic guidance, the venotomy was serially dilated, ultimately allowing placement of a 20 cm temporary Mahurkar catheter with tip ultimately terminating within the superior aspect of the right atrium. Final catheter positioning was confirmed and documented with a spot radiographic image. The catheter aspirates and flushes normally. The catheter was flushed with appropriate volume heparin  dwells. The catheter exit site was secured with a 0-Prolene retention suture. A dressing was placed. The patient tolerated the procedure well without immediate post procedural complication. IMPRESSION: Successful placement of a left internal jugular approach 20 cm temporary dialysis catheter with tip terminating with in the superior aspect of the right atrium. The catheter is ready for immediate use. PLAN: This catheter may be converted to a tunneled dialysis catheter at a later date as indicated. Electronically Signed   By: Sandi Mariscal M.D.   On: 09/29/2020 15:47   DG Chest Port 1 View  Result Date: 09/28/2020 CLINICAL DATA:  69 year old male with shortness of breath. EXAM: PORTABLE CHEST 1 VIEW COMPARISON:  Chest radiograph dated 05/18/2020. FINDINGS: Right-sided Port-A-Cath in similar position. There is diffuse interstitial coarsening and mild chronic bronchitic changes. There are bibasilar atelectasis/scarring. No focal consolidation, pleural effusion, pneumothorax. Stable cardiac silhouette. No acute osseous pathology. Old rib fractures. IMPRESSION: No acute cardiopulmonary process. Electronically Signed   By: Anner Crete M.D.   On: 09/28/2020 18:30   Scheduled Meds: . sodium chloride   Intravenous Once  . alfuzosin  10 mg Oral Q breakfast  . bortezomib SQ  1.3 mg/m2 Subcutaneous Once  . Chlorhexidine Gluconate Cloth  6 each Topical Daily  . Chlorhexidine Gluconate Cloth  6 each Topical Q0600  . furosemide  80 mg Intravenous Q6H  . heparin injection (subcutaneous)  5,000 Units Subcutaneous Q8H  . insulin aspart  0-9 Units Subcutaneous TID WC  . insulin detemir  10 Units Subcutaneous QHS  . pravastatin  20 mg Oral QHS  . sodium bicarbonate  1,300 mg Oral BID  . traZODone  100 mg Oral QHS   Continuous Infusions:   LOS: 2 days    Time spent: 25 mins    Shawna Clamp, MD [facility]   If 7PM-7AM, please contact night-coverage

## 2020-09-30 NOTE — Progress Notes (Signed)
[place]  [phone] [phone]) [phone]  ID: [name] OB: 03/01/1951 MR#: [id number]#:[id number] PCP: Asencion Noble, MD  OTHER MD: Dr. Derek Jack (Medical Oncology)  INTERVAL HISTORY: Mr. Stipes underwent dialysis earlier today.  Tolerated it well overall.  He reports that he is feeling significantly better.  He has received 2 units PRBC so far this admission.  REVIEW OF SYSTEMS: Today, he reports that his breathing is much better.  Still reports some mild abdominal swelling but improved.  The remainder of the review of systems is noncontributory.  PAST MEDICAL HISTORY: Past Medical History:  Diagnosis Date  . Cancer ([place])    multiple myeloma  . Cancer of right kidney ([place])   . Cervical dystonia   . Diabetes mellitus without complication ([place])   . Gout   . Hypercholesteremia    PAST SURGICAL HISTORY: Past Surgical History:  Procedure Laterality Date  . BONE MARROW BIOPSY    . CHOLECYSTECTOMY  2007  . COLONOSCOPY WITH PROPOFOL N/A 01/16/2018   Procedure: COLONOSCOPY WITH PROPOFOL;  Surgeon: Daneil Dolin, MD;  [facility];  Service: Endoscopy;  Laterality: N/A;  1:45pm  . ESOPHAGOGASTRODUODENOSCOPY (EGD) WITH PROPOFOL N/A 01/16/2018   Procedure: ESOPHAGOGASTRODUODENOSCOPY (EGD) WITH PROPOFOL;  Surgeon: Daneil Dolin, MD;  [facility];  Service: Endoscopy;  Laterality: N/A;  . GIVENS CAPSULE STUDY N/A 05/12/2018   Procedure: GIVENS CAPSULE STUDY;  Surgeon: Daneil Dolin, MD;  Location: [facility];  Service: Endoscopy;  Laterality: N/A;  7:30am  . IR FLUORO GUIDE CV LINE LEFT  09/29/2020  . IR US GUIDE VASC ACCESS LEFT  09/29/2020  . NEPHRECTOMY Right 1998   cancer  . PORTACATH PLACEMENT Right 04/07/2020   Procedure: INSERTION PORT-A-CATH (attached catherter in right internal jugular);  Surgeon: Virl Cagey, MD;  Location: [facility];  Service: General;  Laterality: Right;   FAMILY HISTORY Family History   Problem Relation Age of Onset  . Heart failure Mother 20  . Dementia Father   . Colon cancer Neg Hx    SOCIAL HISTORY: The patient lives in [place], [place].  He is not married.  He has no children. He has two dogs. He has a sister named, Arther Heisler, who checks on him.  She is also his healthcare power of attorney.  Has history of alcohol and tobacco use. He has a pilot's license.   ADVANCED DIRECTIVES: He has completed a Living Will and HCPOA. His sister, Aladdin Kollmann is named as his HCPOA.   Social History   Tobacco Use  . Smoking status: Never Smoker  . Smokeless tobacco: Former Systems developer    Types: Secondary school teacher  . Vaping Use: Never used  Substance Use Topics  . Alcohol use: No  . Drug use: No   Colonoscopy: 01/16/2018 Bone density: Lipid panel: No Known Allergies Current Facility-Administered Medications  Medication Dose Route Frequency Provider Last Rate Last Admin  . 0.9 %  sodium chloride infusion (Manually program via Guardrails IV Fluids)   Intravenous Once Caren Griffins, MD      . acetaminophen (TYLENOL) tablet 650 mg  650 mg Oral Q6H PRN Caren Griffins, MD      . alfuzosin (UROXATRAL) 24 hr tablet 10 mg  10 mg Oral Q breakfast Caren Griffins, MD   10 mg at 09/30/20 0844  . bortezomib SQ (VELCADE) chemo injection (2.59m/mL concentration) 3 mg  1.3 mg/m2 Subcutaneous Once Magrinat, GVirgie Dad  MD      . Chlorhexidine Gluconate Cloth 2 % PADS 6 each  6 each Topical Daily Shawna Clamp, MD   6 each at 09/29/20 1000  . Chlorhexidine Gluconate Cloth 2 % PADS 6 each  6 each Topical Q0600 Donato Heinz, MD      . dicyclomine (BENTYL) capsule 10 mg  10 mg Oral BID PRN Caren Griffins, MD      . diphenoxylate-atropine (LOMOTIL) 2.5-0.025 MG per tablet 2 tablet  2 tablet Oral QID PRN Caren Griffins, MD      . furosemide (LASIX) injection 80 mg  80 mg Intravenous Q6H Donato Heinz, MD   80 mg at 09/30/20 0851  . heparin injection 5,000  Units  5,000 Units Subcutaneous Q8H Shawna Clamp, MD   5,000 Units at 09/30/20 0604  . HYDROcodone-acetaminophen (NORCO/VICODIN) 5-325 MG per tablet 1 tablet  1 tablet Oral BID PRN Caren Griffins, MD      . insulin aspart (novoLOG) injection 0-9 Units  0-9 Units Subcutaneous TID WC Gherghe, Costin M, MD      . insulin detemir (LEVEMIR) injection 10 Units  10 Units Subcutaneous QHS Shawna Clamp, MD   10 Units at 09/29/20 2229  . lidocaine-EPINEPHrine (XYLOCAINE W/EPI) 1 %-1:100000 (with pres) injection    PRN Sandi Mariscal, MD   5 mL at 09/29/20 1504  . pravastatin (PRAVACHOL) tablet 20 mg  20 mg Oral QHS Caren Griffins, MD   20 mg at 09/29/20 2133  . sodium bicarbonate tablet 1,300 mg  1,300 mg Oral BID Caren Griffins, MD   1,300 mg at 09/30/20 0851  . traZODone (DESYREL) tablet 100 mg  100 mg Oral QHS Caren Griffins, MD   100 mg at 09/29/20 2134   Facility-Administered Medications Ordered in Other Encounters  Medication Dose Route Frequency Provider Last Rate Last Admin  . 0.9 %  sodium chloride infusion  250 mL Intravenous Once Twana First, MD       OBJECTIVE: Vitals:   09/30/20 1230 09/30/20 1255  BP: 109/65 128/63  Pulse:    Resp: 14 15  Temp:  98.2 F (36.8 C)  SpO2:  98%   Body mass index is 35.91 kg/m. ECOG FS:1 - Symptomatic but completely ambulatory Ocular: Sclerae unicteric, pupils equal, round and reactive to light Ear-nose-throat: Oropharynx clear, dentition fair Lymphatic: No cervical or supraclavicular adenopathy Lungs no rales or rhonchi, good excursion bilaterally Heart regular rate and rhythm, no murmur appreciated Abd positive bowel sounds, mildly distended, nontender MSK no focal spinal tenderness, no joint edema Neuro: non-focal, well-oriented, appropriate affect Breasts:  LAB RESULTS: CMP     Component Value Date/Time   NA 138 09/30/2020 0530   K 5.6 (H) 09/30/2020 0530   CL 100 09/30/2020 0530   CO2 23 09/30/2020 0530   GLUCOSE 87  09/30/2020 0530   BUN 99 (H) 09/30/2020 0530   CREATININE 10.30 (H) 09/30/2020 0530   CALCIUM 8.0 (L) 09/30/2020 0530   CALCIUM 10.7 (H) 07/25/2017 0447   PROT 4.9 (L) 09/30/2020 0530   ALBUMIN 2.8 (L) 09/30/2020 0530   ALBUMIN 2.9 (L) 09/30/2020 0530   AST 14 (L) 09/30/2020 0530   ALT 28 09/30/2020 0530   ALKPHOS 50 09/30/2020 0530   BILITOT 0.8 09/30/2020 0530   GFRNONAA 5 (L) 09/30/2020 0530   GFRAA 30 (L) 07/25/2020 0911   INo results found for: SPEP, UPEP Lab Results  Component Value Date   WBC 3.9 (L) 09/30/2020  NEUTROABS 3.0 09/28/2020   HGB 7.5 (L) 09/30/2020   HCT 22.1 (L) 09/30/2020   MCV 96.5 09/30/2020   PLT 106 (L) 09/30/2020   '@LASTCHEMISTRY' @ No results found for: LABCA2 No components found for: LABCA125 Recent Labs  Lab 09/28/20 1824  INR 1.2   Urinalysis    Component Value Date/Time   COLORURINE STRAW (A) 09/28/2020 2109   APPEARANCEUR CLEAR 09/28/2020 2109   APPEARANCEUR Clear 07/12/2020 0938   LABSPEC 1.011 09/28/2020 2109   PHURINE 7.0 09/28/2020 2109   GLUCOSEU 150 (A) 09/28/2020 2109   HGBUR SMALL (A) 09/28/2020 2109   BILIRUBINUR NEGATIVE 09/28/2020 2109   BILIRUBINUR Negative 07/12/2020 [place] NEGATIVE 09/28/2020 2109   PROTEINUR 30 (A) 09/28/2020 2109   NITRITE NEGATIVE 09/28/2020 2109   LEUKOCYTESUR NEGATIVE 09/28/2020 2109   STUDIES: US RENAL  Result Date: 09/28/2020 CLINICAL DATA:  69 year old male with acute renal insufficiency. Prior right nephrectomy. EXAM: RENAL / URINARY TRACT ULTRASOUND COMPLETE COMPARISON:  None. FINDINGS: Right Kidney: Nephrectomy. Left Kidney: Renal measurements: 12.2 x 6.1 x 5.3 cm = volume: 207 mL. Normal echogenicity. No hydronephrosis or shadowing stone. There is an 8.3 x 6.8 x 8.8 cm left renal inferior pole cyst. Bladder: Appears normal for degree of bladder distention. Other: There is diffuse increased liver echogenicity most commonly seen in the setting of fatty infiltration. Superimposed  inflammation or fibrosis is not excluded. Clinical correlation is recommended. IMPRESSION: 1. Prior right nephrectomy. 2. Large left renal inferior pole cyst. No hydronephrosis or shadowing stone. Electronically Signed   By: Anner Crete M.D.   On: 09/28/2020 23:48   IR Fluoro Guide CV Line Left  Result Date: 09/29/2020 INDICATION: Acute renal insufficiency. Please perform image guided placement of temporary dialysis catheter for the initiation dialysis Note, the decision was made to proceed with placement of a temporary dialysis catheter as there is hope the patient will only temporary hemodialysis per discussion with the providing nephrology service. EXAM: NON-TUNNELED CENTRAL VENOUS HEMODIALYSIS CATHETER PLACEMENT WITH ULTRASOUND AND FLUOROSCOPIC GUIDANCE COMPARISON:  None. MEDICATIONS: None FLUOROSCOPY TIME:  36 seconds (10 mGy) COMPLICATIONS: None immediate. PROCEDURE: Informed written consent was obtained from the patient after a discussion of the risks, benefits, and alternatives to treatment. Questions regarding the procedure were encouraged and answered. Given the presence of the right internal jugular approach port a catheter decision was made to place a left internal jugular approach temporary dialysis catheter As such the left neck and chest were prepped with chlorhexidine in a sterile fashion, and a sterile drape was applied covering the operative field. Maximum barrier sterile technique with sterile gowns and gloves were used for the procedure. A timeout was performed prior to the initiation of the procedure. After the overlying soft tissues were anesthetized, a small venotomy incision was created and a micropuncture kit was utilized to access the internal jugular vein. Real-time ultrasound guidance was utilized for vascular access including the acquisition of a permanent ultrasound image documenting patency of the accessed vessel. The microwire was utilized to measure appropriate catheter  length. A stiff glidewire was advanced to the level of the IVC. Under fluoroscopic guidance, the venotomy was serially dilated, ultimately allowing placement of a 20 cm temporary [place] catheter with tip ultimately terminating within the superior aspect of the right atrium. Final catheter positioning was confirmed and documented with a spot radiographic image. The catheter aspirates and flushes normally. The catheter was flushed with appropriate volume heparin dwells. The catheter exit site was secured with a 0-Prolene  retention suture. A dressing was placed. The patient tolerated the procedure well without immediate post procedural complication. IMPRESSION: Successful placement of a left internal jugular approach 20 cm temporary dialysis catheter with tip terminating with in the superior aspect of the right atrium. The catheter is ready for immediate use. PLAN: This catheter may be converted to a tunneled dialysis catheter at a later date as indicated. Electronically Signed   By: Sandi Mariscal M.D.   On: 09/29/2020 15:47   IR US Guide Vasc Access Left  Result Date: 09/29/2020 INDICATION: Acute renal insufficiency. Please perform image guided placement of temporary dialysis catheter for the initiation dialysis Note, the decision was made to proceed with placement of a temporary dialysis catheter as there is hope the patient will only temporary hemodialysis per discussion with the providing nephrology service. EXAM: NON-TUNNELED CENTRAL VENOUS HEMODIALYSIS CATHETER PLACEMENT WITH ULTRASOUND AND FLUOROSCOPIC GUIDANCE COMPARISON:  None. MEDICATIONS: None FLUOROSCOPY TIME:  36 seconds (10 mGy) COMPLICATIONS: None immediate. PROCEDURE: Informed written consent was obtained from the patient after a discussion of the risks, benefits, and alternatives to treatment. Questions regarding the procedure were encouraged and answered. Given the presence of the right internal jugular approach port a catheter decision was made  to place a left internal jugular approach temporary dialysis catheter As such the left neck and chest were prepped with chlorhexidine in a sterile fashion, and a sterile drape was applied covering the operative field. Maximum barrier sterile technique with sterile gowns and gloves were used for the procedure. A timeout was performed prior to the initiation of the procedure. After the overlying soft tissues were anesthetized, a small venotomy incision was created and a micropuncture kit was utilized to access the internal jugular vein. Real-time ultrasound guidance was utilized for vascular access including the acquisition of a permanent ultrasound image documenting patency of the accessed vessel. The microwire was utilized to measure appropriate catheter length. A stiff glidewire was advanced to the level of the IVC. Under fluoroscopic guidance, the venotomy was serially dilated, ultimately allowing placement of a 20 cm temporary [place] catheter with tip ultimately terminating within the superior aspect of the right atrium. Final catheter positioning was confirmed and documented with a spot radiographic image. The catheter aspirates and flushes normally. The catheter was flushed with appropriate volume heparin dwells. The catheter exit site was secured with a 0-Prolene retention suture. A dressing was placed. The patient tolerated the procedure well without immediate post procedural complication. IMPRESSION: Successful placement of a left internal jugular approach 20 cm temporary dialysis catheter with tip terminating with in the superior aspect of the right atrium. The catheter is ready for immediate use. PLAN: This catheter may be converted to a tunneled dialysis catheter at a later date as indicated. Electronically Signed   By: Sandi Mariscal M.D.   On: 09/29/2020 15:47   DG Chest Port 1 View  Result Date: 09/28/2020 CLINICAL DATA:  69 year old male with shortness of breath. EXAM: PORTABLE CHEST 1 VIEW  COMPARISON:  Chest radiograph dated 05/18/2020. FINDINGS: Right-sided Port-A-Cath in similar position. There is diffuse interstitial coarsening and mild chronic bronchitic changes. There are bibasilar atelectasis/scarring. No focal consolidation, pleural effusion, pneumothorax. Stable cardiac silhouette. No acute osseous pathology. Old rib fractures. IMPRESSION: No acute cardiopulmonary process. Electronically Signed   By: Anner Crete M.D.   On: 09/28/2020 18:30   ASSESSMENT: 69 y.o. male from [place], [place] with   1) IgG kappa multiple myeloma.             (  a) Revlimid x 4 cycles from 09/05/2017 to 02/18/2018 with Revlimid maintenance.             (b) Pomalyst from 06/20/2020 to 09/19/2020.             (c) Darzalex x 6 cycles from 04/11/2020 to 09/05/2020.             (d) Velcade and Selinexor to begin 10/04/2020  2) Anemia  3) AKI/CKD  PLAN: Mr. Carelock continues to feel well.  Underwent dialysis earlier today and tolerated it well.  Nephrology planning to repeat HD again on 10/01/2020.  Due to rising serum light chains, he was scheduled to begin Velcade and selinexor at [facility] on 10/04/2020.  Given his acute renal failure, will give a dose of Velcade today.  Adverse effects of this treatment have been discussed with the patient already.  We will also keep him scheduled at [facility] on 10/04/2020 as previously planned for another dose of Velcade and to begin selinexor.   Mikey Bussing, NP 09/30/2020 1:17 PM

## 2020-10-01 LAB — RENAL FUNCTION PANEL
Albumin: 2.8 g/dL — ABNORMAL LOW (ref 3.5–5.0)
Anion gap: 15 (ref 5–15)
BUN: 61 mg/dL — ABNORMAL HIGH (ref 8–23)
CO2: 24 mmol/L (ref 22–32)
Calcium: 8.2 mg/dL — ABNORMAL LOW (ref 8.9–10.3)
Chloride: 99 mmol/L (ref 98–111)
Creatinine, Ser: 7.45 mg/dL — ABNORMAL HIGH (ref 0.61–1.24)
GFR, Estimated: 7 mL/min — ABNORMAL LOW (ref >60)
Glucose, Bld: 74 mg/dL (ref 70–99)
Phosphorus: 6.9 mg/dL — ABNORMAL HIGH (ref 2.5–4.6)
Potassium: 4.4 mmol/L (ref 3.5–5.1)
Sodium: 138 mmol/L (ref 135–145)

## 2020-10-01 LAB — HEMOGLOBIN AND HEMATOCRIT, BLOOD
HCT: 23.7 % — ABNORMAL LOW (ref 39.0–52.0)
Hemoglobin: 8.1 g/dL — ABNORMAL LOW (ref 13.0–17.0)

## 2020-10-01 LAB — GLUCOSE, CAPILLARY
Glucose-Capillary: 71 mg/dL (ref 70–99)
Glucose-Capillary: 119 mg/dL — ABNORMAL HIGH (ref 70–99)
Glucose-Capillary: 209 mg/dL — ABNORMAL HIGH (ref 70–99)
Glucose-Capillary: 304 mg/dL — ABNORMAL HIGH (ref 70–99)

## 2020-10-01 LAB — MAGNESIUM: Magnesium: 1.7 mg/dL (ref 1.7–2.4)

## 2020-10-01 MED ORDER — HEPARIN SODIUM (PORCINE) 1000 UNIT/ML IJ SOLN
INTRAMUSCULAR | Status: AC
  Administered 2020-10-01: 1000 [IU]
  Filled 2020-10-01: qty 3

## 2020-10-01 MED ORDER — ACYCLOVIR 200 MG PO CAPS
200.0000 mg | ORAL_CAPSULE | Freq: Two times a day (BID) | ORAL | Status: DC
  Administered 2020-10-01 – 2020-10-05 (×9): 200 mg via ORAL
  Filled 2020-10-01 (×10): qty 1

## 2020-10-01 MED ORDER — ACYCLOVIR 400 MG PO TABS
200.0000 mg | ORAL_TABLET | Freq: Two times a day (BID) | ORAL | Status: DC
  Filled 2020-10-01: qty 0.5

## 2020-10-01 MED ORDER — INSULIN ASPART 100 UNIT/ML ~~LOC~~ SOLN
4.0000 [IU] | Freq: Once | SUBCUTANEOUS | Status: AC
  Administered 2020-10-01: 4 [IU] via SUBCUTANEOUS

## 2020-10-01 NOTE — Progress Notes (Signed)
Please administer to [facility] Acyclovir 200 mg po every 12 hours.  T.O. Dr Rhys Martini, PharmD

## 2020-10-01 NOTE — Progress Notes (Signed)
PROGRESS NOTE    Tom Marshall  KYH:[id number] DOB: 1951-02-05 DOA: 09/28/2020 PCP: Asencion Noble, MD   Brief Narrative:  This 69 years old male with PMH of renal cell cancer status post nephrectomy 1998 currently with solitary kidney, CKD stage IV with baseline creatinine between 2.5-2.7, diabetes mellitus, hypertension, hyperlipidemia,  multiple myeloma, actively undergoing treatment at [facility] with Dr. Delton Coombes presented to the emergency department with acute shortness of breath .  He was called by his oncologist to go to the emergency department because of worsening renal functions and hyperkalemia. In the ED his blood work shows a potassium 6.8, bicarb 19, BUN 88, creatinine 9.29, BNP 325, hemoglobin 6.7. Nephrology was consulted,  recommended temporary hemodialysis.  Hematology was consulted.  Patient underwent hemodialysis 11/3 feels better.    Assessment & Plan:   Active Problems:   AKI (acute kidney injury) ([place])   Acute kidney injury on chronic kidney disease stage IV with hyperkalemia: Baseline creatinine around 2.4, currently at 9.0 On-call nephrology, Dr. Moshe Cipro recommended transfer to [facility]. Admitted to stepdown EKG does not show changes of hyperkalemia,  patient was given dextrose and insulin, calcium, as well as has been placed on scheduled Lokelma -Is possible that this is a myeloma kidney given recent concern as an outpatient at the street light chains were increasing.   Obtain light chains, LDH, SPEP,  -Nephrology recommended temporary hemodialysis. -Patient underwent temporary tunneled catheter for hemodialysis by IR -Serum potassium is improving with scheduled Lokelma. -Patient underwent hemodialysis 2 days in a row 12/3- 12/4. -Plan hold on further hemodialysis and to see urine output and see signs of renal recovery.  Type 2 diabetes mellitus - Continue long-acting insulin along with sliding scale, reduced doses due to renal failure.   Multiple myeloma -Case was discussed over the phone with Dr. Delton Coombes who recommended blood work as above. -Oncology consulted, outpatient chemotherapy for multiple myeloma scheduled in [place]. - Patient is going to get Velcade after hemodialysis today.  Essential hypertension -Hold lisinopril   Hyperlipidemia -continue statin  Symptomatic anemia, thrombocytopenia -Likely in the setting of underlying malignancy, transfuse unit of packed red blood cells. Transfuse less compatible PRBC, keep hemoglobin above 7. He is s/p 2 PRBCs posttransfusion hemoglobin 8.1.  History of DVT -Patient reports DVT several years ago, has been on Eliquis but only takes it 2.5 mg once daily.   Given that he may need procedures in the next day or so, acute worsening of his renal function hold in place on heparin s.q.    DVT prophylaxis: Heparin subcu Code Status: Full code Family Communication: No one at bedside Disposition Plan:   Status is: Inpatient  Remains inpatient appropriate because:Inpatient level of care appropriate due to severity of illness   Dispo: The patient is from: Home              Anticipated d/c is to: Home              Anticipated d/c date is: 3 days              Patient currently is not medically stable to d/c.   Consultants:   Nephrology, interventional radiology, oncology  Procedures: Temporary hemodialysis catheter Antimicrobials: Anti-infectives (From admission, onward)   Start     Dose/Rate Route Frequency Ordered Stop   10/01/20 1000  acyclovir (ZOVIRAX) tablet 200 mg  Status:  Discontinued        200 mg Oral 2 times daily 10/01/20 0852 10/01/20  5885   10/01/20 1000  acyclovir (ZOVIRAX) 200 MG capsule 200 mg        200 mg Oral 2 times daily 10/01/20 0277        Subjective: Patient was seen and examined in Hemodialysis unit..  Overnight events noted.  He denies any shortness of breath.  He reports feeling better. He denies any other concerns.   Objective: Vitals:   10/01/20 1200 10/01/20 1227 10/01/20 1241 10/01/20 1318  BP: 112/62 128/66  130/73  Pulse: 74 74  74  Resp: _0 Temp:  98 F (36.7 C)  98.4 F (36.9 C)  TempSrc:  Oral  Axillary  SpO2:   99%   Weight:   103.6 kg   Height:        Intake/Output Summary (Last 24 hours) at 10/01/2020 1420 Last data filed at 10/01/2020 1227 Gross per 24 hour  Intake 890 ml  Output 1401 ml  Net -511 ml   Filed Weights   09/30/20 1255 10/01/20 0920 10/01/20 1241  Weight: 107.1 kg 104.6 kg 103.6 kg    Examination:  General exam: Appears calm and comfortable , not in any distress Respiratory system: Clear to auscultation. Respiratory effort normal. Cardiovascular system: S1 & S2 heard, RRR. No JVD, murmurs, rubs, gallops or clicks. No pedal edema. Gastrointestinal system: Abdomen is distended, soft and nontender. No organomegaly or masses felt. Normal bowel sounds heard. Central nervous system: Alert and oriented. No focal neurological deficits. Extremities: No edema, no cyanosis, no clubbing. Skin: No rashes, lesions or ulcers Psychiatry: Judgement and insight appear normal. Mood & affect appropriate.     Data Reviewed: I have personally reviewed following labs and imaging studies  CBC: Recent Labs  Lab 09/28/20 1458 09/28/20 1458 09/28/20 1824 09/28/20 1824 09/29/20 0500 09/29/20 1540 09/29/20 2330 09/30/20 0530 10/01/20 0531  WBC 3.5*  --  4.3  --  3.3*  --   --  3.9*  --   NEUTROABS 3.0  --   --   --   --   --   --   --   --   HGB 7.1*   < > 6.7*   < > 6.1* 6.6* 7.4* 7.5* 8.1*  HCT 22.1*   < > 20.5*   < > 18.2* 20.1* 21.9* 22.1* 23.7*  MCV 105.7*  --  104.1*  --  101.1*  --   --  96.5  --   PLT 104*  --  110*  --  104*  --   --  106*  --    < > = values in this interval not displayed.   Basic Metabolic Panel: Recent Labs  Lab 09/28/20 1824 09/29/20 0500 09/29/20 1540 09/30/20 0530 10/01/20 0531  NA 133* 139 137 138 138  K 6.8* 6.3* 5.5* 5.6*  4.4  CL 103 104 100 100 99  CO2 19* 22 21* 23 24  GLUCOSE 216* 90 172* 87 74  BUN 88* 89* 87* 99* 61*  CREATININE 9.29* 9.59* 9.59* 10.30* 7.45*  CALCIUM 8.3* 8.4* 8.3* 8.0* 8.2*  MG  --   --   --  1.7 1.7  PHOS  --   --  7.3* 8.4* 6.9*   GFR: Estimated Creatinine Clearance: 10.8 mL/min (A) (by C-G formula based on SCr of 7.45 mg/dL (H)). Liver Function Tests: Recent Labs  Lab 09/28/20 1458 09/28/20 1458 09/28/20 1824 09/29/20 0500 09/29/20 1540 09/30/20 0530 10/01/20 0531  AST 16  --  13* 9*  --  14*  --   ALT 31  --  29 26  --  28  --   ALKPHOS 68  --  61 57  --  50  --   BILITOT 0.6  --  0.5 0.5  --  0.8  --   PROT 6.1*  --  5.7* 5.2*  --  4.9*  --   ALBUMIN 3.5   < > 3.3* 2.9* 2.9* 2.9*  2.8* 2.8*   < > = values in this interval not displayed.   No results for input(s): LIPASE, AMYLASE in the last 168 hours. No results for input(s): AMMONIA in the last 168 hours. Coagulation Profile: Recent Labs  Lab 09/28/20 1824  INR 1.2   Cardiac Enzymes: No results for input(s): CKTOTAL, CKMB, CKMBINDEX, TROPONINI in the last 168 hours. BNP (last 3 results) No results for input(s): PROBNP in the last 8760 hours. HbA1C: Recent Labs    09/29/20 0515  HGBA1C 9.3*   CBG: Recent Labs  Lab 09/30/20 0900 09/30/20 1727 09/30/20 2149 10/01/20 0749 10/01/20 1300  GLUCAP 84 166* 114* 71 119*   Lipid Profile: No results for input(s): CHOL, HDL, LDLCALC, TRIG, CHOLHDL, LDLDIRECT in the last 72 hours. Thyroid Function Tests: No results for input(s): TSH, T4TOTAL, FREET4, T3FREE, THYROIDAB in the last 72 hours. Anemia Panel: No results for input(s): VITAMINB12, FOLATE, FERRITIN, TIBC, IRON, RETICCTPCT in the last 72 hours. Sepsis Labs: No results for input(s): PROCALCITON, LATICACIDVEN in the last 168 hours.  Recent Results (from the past 240 hour(s))  Resp Panel by RT-PCR (Flu A&B, Covid) Nasopharyngeal Swab     Status: None   Collection Time: 09/28/20  6:35 PM    Specimen: Nasopharyngeal Swab; Nasopharyngeal(NP) swabs in vial transport medium  Result Value Ref Range Status   SARS Coronavirus 2 by RT PCR NEGATIVE NEGATIVE Final    Comment: (NOTE) SARS-CoV-2 target nucleic acids are NOT DETECTED.  The SARS-CoV-2 RNA is generally detectable in upper respiratory specimens during the acute phase of infection. The lowest concentration of SARS-CoV-2 viral copies this assay can detect is 138 copies/mL. A negative result does not preclude SARS-Cov-2 infection and should not be used as the sole basis for treatment or other patient management decisions. A negative result may occur with  improper specimen collection/handling, submission of specimen other than nasopharyngeal swab, presence of viral mutation(s) within the areas targeted by this assay, and inadequate number of viral copies(<138 copies/mL). A negative result must be combined with clinical observations, patient history, and epidemiological information. The expected result is Negative.  Fact Sheet for Patients:  EntrepreneurPulse.com.au  Fact Sheet for Healthcare Providers:  IncredibleEmployment.be  This test is no t yet approved or cleared by the [place] FDA and  has been authorized for detection and/or diagnosis of SARS-CoV-2 by FDA under an Emergency Use Authorization (EUA). This EUA will remain  in effect (meaning this test can be used) for the duration of the COVID-19 declaration under Section 564(b)(1) of the Act, 21 U.S.C.section 360bbb-3(b)(1), unless the authorization is terminated  or revoked sooner.       Influenza A by PCR NEGATIVE NEGATIVE Final   Influenza B by PCR NEGATIVE NEGATIVE Final    Comment: (NOTE) The Xpert Xpress SARS-CoV-2/FLU/RSV plus assay is intended as an aid in the diagnosis of influenza from Nasopharyngeal swab specimens and should not be used as a sole basis for treatment. Nasal washings and aspirates are  unacceptable for Xpert Xpress SARS-CoV-2/FLU/RSV testing.  Fact Sheet for Patients: EntrepreneurPulse.com.au  Fact Sheet for Healthcare Providers: IncredibleEmployment.be  This test is not yet approved or cleared by the [place] FDA and has been authorized for detection and/or diagnosis of SARS-CoV-2 by FDA under an Emergency Use Authorization (EUA). This EUA will remain in effect (meaning this test can be used) for the duration of the COVID-19 declaration under Section 564(b)(1) of the Act, 21 U.S.C. section 360bbb-3(b)(1), unless the authorization is terminated or revoked.  Performed at [facility], [address] [place], [place] [address]     Radiology Studies: IR Fluoro Guide CV Line Left  Result Date: 09/29/2020 INDICATION: Acute renal insufficiency. Please perform image guided placement of temporary dialysis catheter for the initiation dialysis Note, the decision was made to proceed with placement of a temporary dialysis catheter as there is hope the patient will only temporary hemodialysis per discussion with the providing nephrology service. EXAM: NON-TUNNELED CENTRAL VENOUS HEMODIALYSIS CATHETER PLACEMENT WITH ULTRASOUND AND FLUOROSCOPIC GUIDANCE COMPARISON:  None. MEDICATIONS: None FLUOROSCOPY TIME:  36 seconds (10 mGy) COMPLICATIONS: None immediate. PROCEDURE: Informed written consent was obtained from the patient after a discussion of the risks, benefits, and alternatives to treatment. Questions regarding the procedure were encouraged and answered. Given the presence of the right internal jugular approach port a catheter decision was made to place a left internal jugular approach temporary dialysis catheter As such the left neck and chest were prepped with chlorhexidine in a sterile fashion, and a sterile drape was applied covering the operative field. Maximum barrier sterile technique with sterile gowns and gloves were used for the  procedure. A timeout was performed prior to the initiation of the procedure. After the overlying soft tissues were anesthetized, a small venotomy incision was created and a micropuncture kit was utilized to access the internal jugular vein. Real-time ultrasound guidance was utilized for vascular access including the acquisition of a permanent ultrasound image documenting patency of the accessed vessel. The microwire was utilized to measure appropriate catheter length. A stiff glidewire was advanced to the level of the IVC. Under fluoroscopic guidance, the venotomy was serially dilated, ultimately allowing placement of a 20 cm temporary Mahurkar catheter with tip ultimately terminating within the superior aspect of the right atrium. Final catheter positioning was confirmed and documented with a spot radiographic image. The catheter aspirates and flushes normally. The catheter was flushed with appropriate volume heparin dwells. The catheter exit site was secured with a 0-Prolene retention suture. A dressing was placed. The patient tolerated the procedure well without immediate post procedural complication. IMPRESSION: Successful placement of a left internal jugular approach 20 cm temporary dialysis catheter with tip terminating with in the superior aspect of the right atrium. The catheter is ready for immediate use. PLAN: This catheter may be converted to a tunneled dialysis catheter at a later date as indicated. Electronically Signed   By: Sandi Mariscal M.D.   On: 09/29/2020 15:47   IR US Guide Vasc Access Left  Result Date: 09/29/2020 INDICATION: Acute renal insufficiency. Please perform image guided placement of temporary dialysis catheter for the initiation dialysis Note, the decision was made to proceed with placement of a temporary dialysis catheter as there is hope the patient will only temporary hemodialysis per discussion with the providing nephrology service. EXAM: NON-TUNNELED CENTRAL VENOUS HEMODIALYSIS  CATHETER PLACEMENT WITH ULTRASOUND AND FLUOROSCOPIC GUIDANCE COMPARISON:  None. MEDICATIONS: None FLUOROSCOPY TIME:  36 seconds (10 mGy) COMPLICATIONS: None immediate. PROCEDURE: Informed written consent was obtained from the patient after a discussion of the risks, benefits, and  alternatives to treatment. Questions regarding the procedure were encouraged and answered. Given the presence of the right internal jugular approach port a catheter decision was made to place a left internal jugular approach temporary dialysis catheter As such the left neck and chest were prepped with chlorhexidine in a sterile fashion, and a sterile drape was applied covering the operative field. Maximum barrier sterile technique with sterile gowns and gloves were used for the procedure. A timeout was performed prior to the initiation of the procedure. After the overlying soft tissues were anesthetized, a small venotomy incision was created and a micropuncture kit was utilized to access the internal jugular vein. Real-time ultrasound guidance was utilized for vascular access including the acquisition of a permanent ultrasound image documenting patency of the accessed vessel. The microwire was utilized to measure appropriate catheter length. A stiff glidewire was advanced to the level of the IVC. Under fluoroscopic guidance, the venotomy was serially dilated, ultimately allowing placement of a 20 cm temporary Mahurkar catheter with tip ultimately terminating within the superior aspect of the right atrium. Final catheter positioning was confirmed and documented with a spot radiographic image. The catheter aspirates and flushes normally. The catheter was flushed with appropriate volume heparin dwells. The catheter exit site was secured with a 0-Prolene retention suture. A dressing was placed. The patient tolerated the procedure well without immediate post procedural complication. IMPRESSION: Successful placement of a left internal jugular  approach 20 cm temporary dialysis catheter with tip terminating with in the superior aspect of the right atrium. The catheter is ready for immediate use. PLAN: This catheter may be converted to a tunneled dialysis catheter at a later date as indicated. Electronically Signed   By: Sandi Mariscal M.D.   On: 09/29/2020 15:47   Scheduled Meds: . sodium chloride   Intravenous Once  . acyclovir  200 mg Oral BID  . alfuzosin  10 mg Oral Q breakfast  . Chlorhexidine Gluconate Cloth  6 each Topical Daily  . furosemide  80 mg Intravenous Q6H  . heparin injection (subcutaneous)  5,000 Units Subcutaneous Q8H  . insulin aspart  0-9 Units Subcutaneous TID WC  . insulin detemir  10 Units Subcutaneous QHS  . pravastatin  20 mg Oral QHS  . sodium bicarbonate  1,300 mg Oral BID  . traZODone  100 mg Oral QHS   Continuous Infusions:   LOS: 3 days    Time spent: 25 mins    Shawna Clamp, MD [facility]   If 7PM-7AM, please contact night-coverage

## 2020-10-01 NOTE — Progress Notes (Addendum)
Ok to treat with Velcade on 10/01/20 with abnormal labs from 09/30/20 per Dr. Delton Coombes.  Hardie Pulley, PharmD, BCPS, BCOP

## 2020-10-01 NOTE — Progress Notes (Signed)
Patient ID: Tom Marshall, male   DOB: 1951/02/03, 69 y.o.   MRN: [id number] S: Feels better.  Seen and examined while on HD. O:BP 120/62   Pulse 73   Temp (!) 97.5 F (36.4 C) (Oral)   Resp 13   Ht 5' 7.99" (1.727 m)   Wt 104.6 kg Comment: standing wt.  SpO2 99%   BMI 35.07 kg/m   Intake/Output Summary (Last 24 hours) at 10/01/2020 1214 Last data filed at 09/30/2020 2359 Gross per 24 hour  Intake 890 ml  Output 1401 ml  Net -511 ml   Intake/Output: I/O last 3 completed shifts: In: 1330 [P.O.:890; Blood:440] Out: 0488 [Urine:400; Other:1000; Stool:1]  Intake/Output this shift:  No intake/output data recorded. Weight change: -0.717 kg Gen: NAD CVS: RRR Resp: cta Abd: +BS, soft, NT/ND Ext: 1+ pretibial edema bilateral lower extremities.  Recent Labs  Lab 09/28/20 1458 09/28/20 1824 09/29/20 0500 09/29/20 1540 09/30/20 0530 10/01/20 0531  NA 133* 133* 139 137 138 138  K 7.1* 6.8* 6.3* 5.5* 5.6* 4.4  CL 101 103 104 100 100 99  CO2 19* 19* 22 21* 23 24  GLUCOSE 263* 216* 90 172* 87 74  BUN 86* 88* 89* 87* 99* 61*  CREATININE 9.42* 9.29* 9.59* 9.59* 10.30* 7.45*  ALBUMIN 3.5 3.3* 2.9* 2.9* 2.9*  2.8* 2.8*  CALCIUM 8.4* 8.3* 8.4* 8.3* 8.0* 8.2*  PHOS  --   --   --  7.3* 8.4* 6.9*  AST 16 13* 9*  --  14*  --   ALT _0 --  28  --    Liver Function Tests: Recent Labs  Lab 09/28/20 1824 09/28/20 1824 09/29/20 0500 09/29/20 0500 09/29/20 1540 09/30/20 0530 10/01/20 0531  AST 13*  --  9*  --   --  14*  --   ALT 29  --  26  --   --  28  --   ALKPHOS 61  --  57  --   --  50  --   BILITOT 0.5  --  0.5  --   --  0.8  --   PROT 5.7*  --  5.2*  --   --  4.9*  --   ALBUMIN 3.3*   < > 2.9*   < > 2.9* 2.9*  2.8* 2.8*   < > = values in this interval not displayed.   No results for input(s): LIPASE, AMYLASE in the last 168 hours. No results for input(s): AMMONIA in the last 168 hours. CBC: Recent Labs  Lab 09/28/20 1458 09/28/20 1458 09/28/20 1824  09/28/20 1824 09/29/20 0500 09/29/20 1540 09/29/20 2330 09/30/20 0530 10/01/20 0531  WBC 3.5*   < > 4.3  --  3.3*  --   --  3.9*  --   NEUTROABS 3.0  --   --   --   --   --   --   --   --   HGB 7.1*   < > 6.7*   < > 6.1*   < > 7.4* 7.5* 8.1*  HCT 22.1*   < > 20.5*   < > 18.2*   < > 21.9* 22.1* 23.7*  MCV 105.7*  --  104.1*  --  101.1*  --   --  96.5  --   PLT 104*   < > 110*  --  104*  --   --  106*  --    < > = values in this interval  not displayed.   Cardiac Enzymes: No results for input(s): CKTOTAL, CKMB, CKMBINDEX, TROPONINI in the last 168 hours. CBG: Recent Labs  Lab 09/29/20 2304 09/30/20 0900 09/30/20 1727 09/30/20 2149 10/01/20 0749  GLUCAP 84 84 166* 114* 71    Iron Studies: No results for input(s): IRON, TIBC, TRANSFERRIN, FERRITIN in the last 72 hours. Studies/Results: IR Fluoro Guide CV Line Left  Result Date: 09/29/2020 INDICATION: Acute renal insufficiency. Please perform image guided placement of temporary dialysis catheter for the initiation dialysis Note, the decision was made to proceed with placement of a temporary dialysis catheter as there is hope the patient will only temporary hemodialysis per discussion with the providing nephrology service. EXAM: NON-TUNNELED CENTRAL VENOUS HEMODIALYSIS CATHETER PLACEMENT WITH ULTRASOUND AND FLUOROSCOPIC GUIDANCE COMPARISON:  None. MEDICATIONS: None FLUOROSCOPY TIME:  36 seconds (10 mGy) COMPLICATIONS: None immediate. PROCEDURE: Informed written consent was obtained from the patient after a discussion of the risks, benefits, and alternatives to treatment. Questions regarding the procedure were encouraged and answered. Given the presence of the right internal jugular approach port a catheter decision was made to place a left internal jugular approach temporary dialysis catheter As such the left neck and chest were prepped with chlorhexidine in a sterile fashion, and a sterile drape was applied covering the operative field.  Maximum barrier sterile technique with sterile gowns and gloves were used for the procedure. A timeout was performed prior to the initiation of the procedure. After the overlying soft tissues were anesthetized, a small venotomy incision was created and a micropuncture kit was utilized to access the internal jugular vein. Real-time ultrasound guidance was utilized for vascular access including the acquisition of a permanent ultrasound image documenting patency of the accessed vessel. The microwire was utilized to measure appropriate catheter length. A stiff glidewire was advanced to the level of the IVC. Under fluoroscopic guidance, the venotomy was serially dilated, ultimately allowing placement of a 20 cm temporary [place] catheter with tip ultimately terminating within the superior aspect of the right atrium. Final catheter positioning was confirmed and documented with a spot radiographic image. The catheter aspirates and flushes normally. The catheter was flushed with appropriate volume heparin dwells. The catheter exit site was secured with a 0-Prolene retention suture. A dressing was placed. The patient tolerated the procedure well without immediate post procedural complication. IMPRESSION: Successful placement of a left internal jugular approach 20 cm temporary dialysis catheter with tip terminating with in the superior aspect of the right atrium. The catheter is ready for immediate use. PLAN: This catheter may be converted to a tunneled dialysis catheter at a later date as indicated. Electronically Signed   By: Sandi Mariscal M.D.   On: 09/29/2020 15:47   IR US Guide Vasc Access Left  Result Date: 09/29/2020 INDICATION: Acute renal insufficiency. Please perform image guided placement of temporary dialysis catheter for the initiation dialysis Note, the decision was made to proceed with placement of a temporary dialysis catheter as there is hope the patient will only temporary hemodialysis per discussion with  the providing nephrology service. EXAM: NON-TUNNELED CENTRAL VENOUS HEMODIALYSIS CATHETER PLACEMENT WITH ULTRASOUND AND FLUOROSCOPIC GUIDANCE COMPARISON:  None. MEDICATIONS: None FLUOROSCOPY TIME:  36 seconds (10 mGy) COMPLICATIONS: None immediate. PROCEDURE: Informed written consent was obtained from the patient after a discussion of the risks, benefits, and alternatives to treatment. Questions regarding the procedure were encouraged and answered. Given the presence of the right internal jugular approach port a catheter decision was made to place a left internal jugular  approach temporary dialysis catheter As such the left neck and chest were prepped with chlorhexidine in a sterile fashion, and a sterile drape was applied covering the operative field. Maximum barrier sterile technique with sterile gowns and gloves were used for the procedure. A timeout was performed prior to the initiation of the procedure. After the overlying soft tissues were anesthetized, a small venotomy incision was created and a micropuncture kit was utilized to access the internal jugular vein. Real-time ultrasound guidance was utilized for vascular access including the acquisition of a permanent ultrasound image documenting patency of the accessed vessel. The microwire was utilized to measure appropriate catheter length. A stiff glidewire was advanced to the level of the IVC. Under fluoroscopic guidance, the venotomy was serially dilated, ultimately allowing placement of a 20 cm temporary Mahurkar catheter with tip ultimately terminating within the superior aspect of the right atrium. Final catheter positioning was confirmed and documented with a spot radiographic image. The catheter aspirates and flushes normally. The catheter was flushed with appropriate volume heparin dwells. The catheter exit site was secured with a 0-Prolene retention suture. A dressing was placed. The patient tolerated the procedure well without immediate post  procedural complication. IMPRESSION: Successful placement of a left internal jugular approach 20 cm temporary dialysis catheter with tip terminating with in the superior aspect of the right atrium. The catheter is ready for immediate use. PLAN: This catheter may be converted to a tunneled dialysis catheter at a later date as indicated. Electronically Signed   By: Sandi Mariscal M.D.   On: 09/29/2020 15:47   . sodium chloride   Intravenous Once  . acyclovir  200 mg Oral BID  . alfuzosin  10 mg Oral Q breakfast  . bortezomib SQ  1.3 mg/m2 (Treatment Plan Recorded) Subcutaneous Once  . Chlorhexidine Gluconate Cloth  6 each Topical Daily  . dexamethasone  20 mg Oral Once  . furosemide  80 mg Intravenous Q6H  . heparin injection (subcutaneous)  5,000 Units Subcutaneous Q8H  . heparin sodium (porcine)      . insulin aspart  0-9 Units Subcutaneous TID WC  . insulin detemir  10 Units Subcutaneous QHS  . pravastatin  20 mg Oral QHS  . sodium bicarbonate  1,300 mg Oral BID  . traZODone  100 mg Oral QHS    BMET    Component Value Date/Time   NA 138 10/01/2020 0531   K 4.4 10/01/2020 0531   CL 99 10/01/2020 0531   CO2 24 10/01/2020 0531   GLUCOSE 74 10/01/2020 0531   BUN 61 (H) 10/01/2020 0531   CREATININE 7.45 (H) 10/01/2020 0531   CALCIUM 8.2 (L) 10/01/2020 0531   CALCIUM 10.7 (H) 07/25/2017 0447   GFRNONAA 7 (L) 10/01/2020 0531   GFRAA 30 (L) 07/25/2020 0911   CBC    Component Value Date/Time   WBC 3.9 (L) 09/30/2020 0530   RBC 2.29 (L) 09/30/2020 0530   HGB 8.1 (L) 10/01/2020 0531   HCT 23.7 (L) 10/01/2020 0531   PLT 106 (L) 09/30/2020 0530   MCV 96.5 09/30/2020 0530   MCH 32.8 09/30/2020 0530   MCHC 33.9 09/30/2020 0530   RDW 18.8 (H) 09/30/2020 0530   LYMPHSABS 0.3 (L) 09/28/2020 1458   MONOABS 0.3 09/28/2020 1458   EOSABS 0.0 09/28/2020 1458   BASOSABS 0.0 09/28/2020 1458    Assessment/Plan:  1. 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 Scr jumped  from baseline of 2.78 on 11/22 up to 9.42 on 09/28/20. He had been on lisinopril 5 mg daily prior to admission and did have some diarrhea as well as progressive increased lower extremity edema.  1. No significant improvement of UOP or BUN/Cr with IV lasix and persistent hyperkalemia. 2. Initiated HD 10/01/20 and again today. 3. Will hold further dialysis for now and follow UOP and Scr for renal recovery 4. 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 5. LIJ temp HD catheter placed by IR on 09/29/20 2. Hyperkalemia- improving with lokelma and IV lasix 3. Anasarca/volume overload- unclear if he has a history of CHF but low albumin and AKI likely contributing as well as myeloma.  4. HTN 5. Multiple myeloma- currently receiving chemo at the cancer center at [facility] and ok to receive velcade per Pharmacy/Hematology. 6. Anemia- acute on chronic - s/p blood transfusion 09/30/20. Continue to follow   Donetta Potts, MD [facility] [phone]

## 2020-10-02 LAB — CBC
HCT: 26.5 % — ABNORMAL LOW (ref 39.0–52.0)
Hemoglobin: 8.8 g/dL — ABNORMAL LOW (ref 13.0–17.0)
MCH: 31.5 pg (ref 26.0–34.0)
MCHC: 33.2 g/dL (ref 30.0–36.0)
MCV: 95 fL (ref 80.0–100.0)
Platelets: 149 10*3/uL — ABNORMAL LOW (ref 150–400)
RBC: 2.79 MIL/uL — ABNORMAL LOW (ref 4.22–5.81)
RDW: 17.2 % — ABNORMAL HIGH (ref 11.5–15.5)
WBC: 4.9 10*3/uL (ref 4.0–10.5)
nRBC: 0 % (ref 0.0–0.2)

## 2020-10-02 LAB — RENAL FUNCTION PANEL
Albumin: 3.3 g/dL — ABNORMAL LOW (ref 3.5–5.0)
Anion gap: 14 (ref 5–15)
BUN: 47 mg/dL — ABNORMAL HIGH (ref 8–23)
CO2: 26 mmol/L (ref 22–32)
Calcium: 8.8 mg/dL — ABNORMAL LOW (ref 8.9–10.3)
Chloride: 96 mmol/L — ABNORMAL LOW (ref 98–111)
Creatinine, Ser: 5.73 mg/dL — ABNORMAL HIGH (ref 0.61–1.24)
GFR, Estimated: 10 mL/min — ABNORMAL LOW (ref >60)
Glucose, Bld: 251 mg/dL — ABNORMAL HIGH (ref 70–99)
Phosphorus: 5.6 mg/dL — ABNORMAL HIGH (ref 2.5–4.6)
Potassium: 4.8 mmol/L (ref 3.5–5.1)
Sodium: 136 mmol/L (ref 135–145)

## 2020-10-02 LAB — GLUCOSE, CAPILLARY
Glucose-Capillary: 277 mg/dL — ABNORMAL HIGH (ref 70–99)
Glucose-Capillary: 188 mg/dL — ABNORMAL HIGH (ref 70–99)
Glucose-Capillary: 185 mg/dL — ABNORMAL HIGH (ref 70–99)
Glucose-Capillary: 271 mg/dL — ABNORMAL HIGH (ref 70–99)

## 2020-10-02 LAB — MAGNESIUM: Magnesium: 1.9 mg/dL (ref 1.7–2.4)

## 2020-10-02 NOTE — Progress Notes (Signed)
PROGRESS NOTE    Tom Marshall  KGS:[id number] DOB: 11/04/1950 DOA: 09/28/2020 PCP: Asencion Noble, MD   Brief Narrative:  This 69 years old male with PMH of renal cell cancer status post nephrectomy 1998 currently with solitary kidney, CKD stage IV with baseline creatinine between 2.5-2.7, diabetes mellitus, hypertension, hyperlipidemia,  multiple myeloma, actively undergoing treatment at [facility] with Dr. Delton Coombes presented to the emergency department with acute shortness of breath .  He was called by his oncologist to go to the emergency department because of worsening renal functions and hyperkalemia. In the ED his blood work shows a potassium 6.8, bicarb 19, BUN 88, creatinine 9.29, BNP 325, hemoglobin 6.7. Nephrology was consulted,  recommended temporary hemodialysis.  Hematology was consulted.  Patient underwent hemodialysis 11/3 feels better.    Assessment & Plan:   Active Problems:   AKI (acute kidney injury) ([place])   Acute kidney injury on chronic kidney disease stage IV with hyperkalemia: Baseline creatinine around 2.4, currently at 9.0 On-call nephrology, Dr. Moshe Cipro recommended transfer to [facility]. Admitted to stepdown EKG does not show changes of hyperkalemia,  patient was given dextrose and insulin, calcium, as well as has been placed on scheduled Lokelma -Is possible that this is a myeloma kidney given recent concern as an outpatient at the street light chains were increasing.   Obtain light chains, LDH, SPEP,  -Nephrology recommended temporary hemodialysis. -Patient underwent temporary tunneled catheter for hemodialysis by IR -Serum potassium is improving with scheduled Lokelma. -Patient underwent hemodialysis 2 days in a row 12/3- 12/4. -Plan hold on further hemodialysis and to see urine output and see signs of renal recovery. -Patient is improving urine output is improved. -If serum creatinine continues to improve without hemodialysis.  Possible  removal of hemodialysis catheter and close follow-up with Dr. Theador Hawthorne after discharge  Type 2 diabetes mellitus - Continue long-acting insulin along with sliding scale, reduced doses due to renal failure.  Multiple myeloma -Case was discussed over the phone with Dr. Delton Coombes who recommended blood work as above. -Oncology consulted, outpatient chemotherapy for multiple myeloma scheduled in [place]. -Patient has recieved Velcade after hemodialysis 09/01/20.  Essential hypertension -Hold lisinopril   Hyperlipidemia -continue statin  Symptomatic anemia, thrombocytopenia -Likely in the setting of underlying malignancy, transfuse unit of packed red blood cells. Transfuse less compatible PRBC, keep hemoglobin above 7. He is s/p 2 PRBCs posttransfusion hemoglobin 8.5  History of DVT -Patient reports DVT several years ago, has been on Eliquis but only takes it 2.5 mg once daily.   Given that he may need procedures in the next day or so, acute worsening of his renal function hold in place on heparin s.q.    DVT prophylaxis: Heparin subcu Code Status: Full code Family Communication: No one at bedside Disposition Plan:   Status is: Inpatient  Remains inpatient appropriate because:Inpatient level of care appropriate due to severity of illness   Dispo: The patient is from: Home              Anticipated d/c is to: Home              Anticipated d/c date is: 3 days              Patient currently is not medically stable to d/c.   Consultants:   Nephrology, interventional radiology, oncology  Procedures: Temporary hemodialysis catheter Antimicrobials: Anti-infectives (From admission, onward)   Start     Dose/Rate Route Frequency Ordered Stop   10/01/20 1000  acyclovir (ZOVIRAX) tablet 200 mg  Status:  Discontinued        200 mg Oral 2 times daily 10/01/20 0852 10/01/20 0937   10/01/20 1000  acyclovir (ZOVIRAX) 200 MG capsule 200 mg        200 mg Oral 2 times daily  10/01/20 4825        Subjective: Patient was seen and examined at bedside..  Overnight events noted.   He denies any shortness of breath.  He reports feeling better. He wants to be discharged home.   Objective: Vitals:   10/01/20 1318 10/01/20 2111 10/02/20 0403 10/02/20 0744  BP: 130/73 130/61 134/63 (!) 141/67  Pulse: 74 74 69 70  Resp: _0 Temp: 98.4 F (36.9 C) 98.4 F (36.9 C) 98.1 F (36.7 C) 98.1 F (36.7 C)  TempSrc: Axillary Oral Oral Oral  SpO2:  97% 97% 96%  Weight:      Height:        Intake/Output Summary (Last 24 hours) at 10/02/2020 1411 Last data filed at 10/02/2020 1253 Gross per 24 hour  Intake 820 ml  Output 800 ml  Net 20 ml   Filed Weights   09/30/20 1255 10/01/20 0920 10/01/20 1241  Weight: 107.1 kg 104.6 kg 103.6 kg    Examination:  General exam: Appears calm and comfortable , not in any distress Respiratory system: Clear to auscultation. Respiratory effort normal. Cardiovascular system: S1 & S2 heard, RRR. No JVD, murmurs, rubs, gallops or clicks. No pedal edema. Gastrointestinal system: Abdomen is distended, soft and nontender. No organomegaly or masses felt. Normal bowel sounds heard. Central nervous system: Alert and oriented. No focal neurological deficits. Extremities: No edema, no cyanosis, no clubbing. Skin: No rashes, lesions or ulcers Psychiatry: Judgement and insight appear normal. Mood & affect appropriate.     Data Reviewed: I have personally reviewed following labs and imaging studies  CBC: Recent Labs  Lab 09/28/20 1458 09/28/20 1458 09/28/20 1824 09/28/20 1824 09/29/20 0500 09/29/20 0500 09/29/20 1540 09/29/20 2330 09/30/20 0530 10/01/20 0531 10/02/20 0407  WBC 3.5*  --  4.3  --  3.3*  --   --   --  3.9*  --  4.9  NEUTROABS 3.0  --   --   --   --   --   --   --   --   --   --   HGB 7.1*   < > 6.7*   < > 6.1*   < > 6.6* 7.4* 7.5* 8.1* 8.8*  HCT 22.1*   < > 20.5*   < > 18.2*   < > 20.1* 21.9* 22.1* 23.7*  26.5*  MCV 105.7*  --  104.1*  --  101.1*  --   --   --  96.5  --  95.0  PLT 104*  --  110*  --  104*  --   --   --  106*  --  149*   < > = values in this interval not displayed.   Basic Metabolic Panel: Recent Labs  Lab 09/29/20 0500 09/29/20 1540 09/30/20 0530 10/01/20 0531 10/02/20 0407  NA 139 137 138 138 136  K 6.3* 5.5* 5.6* 4.4 4.8  CL 104 100 100 99 96*  CO2 22 21* _1 GLUCOSE 90 172* 87 74 251*  BUN 89* 87* 99* 61* 47*  CREATININE 9.59* 9.59* 10.30* 7.45* 5.73*  CALCIUM 8.4* 8.3* 8.0* 8.2* 8.8*  MG  --   --  1.7 1.7 1.9  PHOS  --  7.3* 8.4* 6.9* 5.6*   GFR: Estimated Creatinine Clearance: 14 mL/min (A) (by C-G formula based on SCr of 5.73 mg/dL (H)). Liver Function Tests: Recent Labs  Lab 09/28/20 1458 09/28/20 1458 09/28/20 1824 09/28/20 1824 09/29/20 0500 09/29/20 1540 09/30/20 0530 10/01/20 0531 10/02/20 0407  AST 16  --  13*  --  9*  --  14*  --   --   ALT 31  --  29  --  26  --  28  --   --   ALKPHOS 68  --  61  --  57  --  50  --   --   BILITOT 0.6  --  0.5  --  0.5  --  0.8  --   --   PROT 6.1*  --  5.7*  --  5.2*  --  4.9*  --   --   ALBUMIN 3.5   < > 3.3*   < > 2.9* 2.9* 2.9*  2.8* 2.8* 3.3*   < > = values in this interval not displayed.   No results for input(s): LIPASE, AMYLASE in the last 168 hours. No results for input(s): AMMONIA in the last 168 hours. Coagulation Profile: Recent Labs  Lab 09/28/20 1824  INR 1.2   Cardiac Enzymes: No results for input(s): CKTOTAL, CKMB, CKMBINDEX, TROPONINI in the last 168 hours. BNP (last 3 results) No results for input(s): PROBNP in the last 8760 hours. HbA1C: No results for input(s): HGBA1C in the last 72 hours. CBG: Recent Labs  Lab 10/01/20 1300 10/01/20 1626 10/01/20 2110 10/02/20 0736 10/02/20 1235  GLUCAP 119* 209* 304* 277* 188*   Lipid Profile: No results for input(s): CHOL, HDL, LDLCALC, TRIG, CHOLHDL, LDLDIRECT in the last 72 hours. Thyroid Function Tests: No results  for input(s): TSH, T4TOTAL, FREET4, T3FREE, THYROIDAB in the last 72 hours. Anemia Panel: No results for input(s): VITAMINB12, FOLATE, FERRITIN, TIBC, IRON, RETICCTPCT in the last 72 hours. Sepsis Labs: No results for input(s): PROCALCITON, LATICACIDVEN in the last 168 hours.  Recent Results (from the past 240 hour(s))  Resp Panel by RT-PCR (Flu A&B, Covid) Nasopharyngeal Swab     Status: None   Collection Time: 09/28/20  6:35 PM   Specimen: Nasopharyngeal Swab; Nasopharyngeal(NP) swabs in vial transport medium  Result Value Ref Range Status   SARS Coronavirus 2 by RT PCR NEGATIVE NEGATIVE Final    Comment: (NOTE) SARS-CoV-2 target nucleic acids are NOT DETECTED.  The SARS-CoV-2 RNA is generally detectable in upper respiratory specimens during the acute phase of infection. The lowest concentration of SARS-CoV-2 viral copies this assay can detect is 138 copies/mL. A negative result does not preclude SARS-Cov-2 infection and should not be used as the sole basis for treatment or other patient management decisions. A negative result may occur with  improper specimen collection/handling, submission of specimen other than nasopharyngeal swab, presence of viral mutation(s) within the areas targeted by this assay, and inadequate number of viral copies(<138 copies/mL). A negative result must be combined with clinical observations, patient history, and epidemiological information. The expected result is Negative.  Fact Sheet for Patients:  EntrepreneurPulse.com.au  Fact Sheet for Healthcare Providers:  IncredibleEmployment.be  This test is no t yet approved or cleared by the [place] FDA and  has been authorized for detection and/or diagnosis of SARS-CoV-2 by FDA under an Emergency Use Authorization (EUA). This EUA will remain  in effect (meaning this test can be used) for  the duration of the COVID-19 declaration under Section 564(b)(1) of the Act,  21 U.S.C.section 360bbb-3(b)(1), unless the authorization is terminated  or revoked sooner.       Influenza A by PCR NEGATIVE NEGATIVE Final   Influenza B by PCR NEGATIVE NEGATIVE Final    Comment: (NOTE) The Xpert Xpress SARS-CoV-2/FLU/RSV plus assay is intended as an aid in the diagnosis of influenza from Nasopharyngeal swab specimens and should not be used as a sole basis for treatment. Nasal washings and aspirates are unacceptable for Xpert Xpress SARS-CoV-2/FLU/RSV testing.  Fact Sheet for Patients: EntrepreneurPulse.com.au  Fact Sheet for Healthcare Providers: IncredibleEmployment.be  This test is not yet approved or cleared by the [place] FDA and has been authorized for detection and/or diagnosis of SARS-CoV-2 by FDA under an Emergency Use Authorization (EUA). This EUA will remain in effect (meaning this test can be used) for the duration of the COVID-19 declaration under Section 564(b)(1) of the Act, 21 U.S.C. section 360bbb-3(b)(1), unless the authorization is terminated or revoked.  Performed at [facility], [address], [place], [place] [address]     Radiology Studies: No results found. Scheduled Meds: . sodium chloride   Intravenous Once  . acyclovir  200 mg Oral BID  . alfuzosin  10 mg Oral Q breakfast  . Chlorhexidine Gluconate Cloth  6 each Topical Daily  . furosemide  80 mg Intravenous Q6H  . heparin injection (subcutaneous)  5,000 Units Subcutaneous Q8H  . insulin aspart  0-9 Units Subcutaneous TID WC  . insulin detemir  10 Units Subcutaneous QHS  . pravastatin  20 mg Oral QHS  . sodium bicarbonate  1,300 mg Oral BID  . traZODone  100 mg Oral QHS   Continuous Infusions:   LOS: 4 days    Time spent: 25 mins    Shawna Clamp, MD [facility]   If 7PM-7AM, please contact night-coverage

## 2020-10-02 NOTE — Plan of Care (Signed)
  Problem: Education: Goal: Knowledge of disease and its progression will improve Outcome: Progressing   Problem: Fluid Volume: Goal: Compliance with measures to maintain balanced fluid volume will improve Outcome: Progressing   Problem: Education: Goal: Knowledge of General Education information will improve Description: Including pain rating scale, medication(s)/side effects and non-pharmacologic comfort measures Outcome: Progressing   Problem: Clinical Measurements: Goal: Respiratory complications will improve Outcome: Progressing Goal: Cardiovascular complication will be avoided Outcome: Progressing   Problem: Activity: Goal: Risk for activity intolerance will decrease Outcome: Progressing   Problem: Coping: Goal: Level of anxiety will decrease Outcome: Progressing   Problem: Pain Managment: Goal: General experience of comfort will improve Outcome: Progressing   Problem: Safety: Goal: Ability to remain free from injury will improve Outcome: Progressing   Problem: Education: Goal: Knowledge of disease and its progression will improve Outcome: Progressing   Problem: Clinical Measurements: Goal: Dialysis access will remain free of complications Outcome: Progressing   Problem: Activity: Goal: Activity intolerance will improve Outcome: Progressing   Problem: Respiratory: Goal: Respiratory symptoms related to disease process will be avoided Outcome: Progressing   Problem: Education: Goal: Knowledge of the prescribed therapeutic regimen will improve Outcome: Progressing   Problem: Coping: Goal: Ability to identify and develop effective coping behavior will improve Outcome: Progressing

## 2020-10-02 NOTE — Progress Notes (Signed)
Patient ID: Tom Marshall, male   DOB: 25-Jun-1951, 69 y.o.   MRN: [id number] S: Feels better today and anxious to go home.   O:BP (!) 141/67 (BP Location: Left Arm)   Pulse 70   Temp 98.1 F (36.7 C) (Oral)   Resp 16   Ht 5' 7.99" (1.727 m)   Wt 103.6 kg   SpO2 96%   BMI 34.74 kg/m   Intake/Output Summary (Last 24 hours) at 10/02/2020 1230 Last data filed at 10/02/2020 1100 Gross per 24 hour  Intake 820 ml  Output 400 ml  Net 420 ml   Intake/Output: I/O last 3 completed shifts: In: 960 [P.O.:960] Out: 1000 [Other:1000]  Intake/Output this shift:  Total I/O In: 220 [P.O.:220] Out: 400 [Urine:400] Weight change: -4 kg Gen: NAD CVS: RRR, no rub Resp: cta Abd: obese, +BS, soft, NT/ND Ext: trace pretibial edema  Recent Labs  Lab 09/28/20 1458 09/28/20 1824 09/29/20 0500 09/29/20 1540 09/30/20 0530 10/01/20 0531 10/02/20 0407  NA 133* 133* 139 137 138 138 136  K 7.1* 6.8* 6.3* 5.5* 5.6* 4.4 4.8  CL 101 103 104 100 100 99 96*  CO2 19* 19* 22 21* $Remo'23 24 26  'ZKLuz$ GLUCOSE 263* 216* 90 172* 87 74 251*  BUN 86* 88* 89* 87* 99* 61* 47*  CREATININE 9.42* 9.29* 9.59* 9.59* 10.30* 7.45* 5.73*  ALBUMIN 3.5 3.3* 2.9* 2.9* 2.9*  2.8* 2.8* 3.3*  CALCIUM 8.4* 8.3* 8.4* 8.3* 8.0* 8.2* 8.8*  PHOS  --   --   --  7.3* 8.4* 6.9* 5.6*  AST 16 13* 9*  --  14*  --   --   ALT $Re'31 29 26  'RYy$ --  28  --   --    Liver Function Tests: Recent Labs  Lab 09/28/20 1824 09/28/20 1824 09/29/20 0500 09/29/20 1540 09/30/20 0530 10/01/20 0531 10/02/20 0407  AST 13*  --  9*  --  14*  --   --   ALT 29  --  26  --  28  --   --   ALKPHOS 61  --  57  --  50  --   --   BILITOT 0.5  --  0.5  --  0.8  --   --   PROT 5.7*  --  5.2*  --  4.9*  --   --   ALBUMIN 3.3*   < > 2.9*   < > 2.9*  2.8* 2.8* 3.3*   < > = values in this interval not displayed.   No results for input(s): LIPASE, AMYLASE in the last 168 hours. No results for input(s): AMMONIA in the last 168 hours. CBC: Recent Labs  Lab  09/28/20 1458 09/28/20 1458 09/28/20 1824 09/28/20 1824 09/29/20 0500 09/29/20 1540 09/30/20 0530 10/01/20 0531 10/02/20 0407  WBC 3.5*   < > 4.3   < > 3.3*  --  3.9*  --  4.9  NEUTROABS 3.0  --   --   --   --   --   --   --   --   HGB 7.1*   < > 6.7*   < > 6.1*   < > 7.5* 8.1* 8.8*  HCT 22.1*   < > 20.5*   < > 18.2*   < > 22.1* 23.7* 26.5*  MCV 105.7*  --  104.1*  --  101.1*  --  96.5  --  95.0  PLT 104*   < > 110*   < >  104*  --  106*  --  149*   < > = values in this interval not displayed.   Cardiac Enzymes: No results for input(s): CKTOTAL, CKMB, CKMBINDEX, TROPONINI in the last 168 hours. CBG: Recent Labs  Lab 10/01/20 0749 10/01/20 1300 10/01/20 1626 10/01/20 2110 10/02/20 0736  GLUCAP 71 119* 209* 304* 277*    Iron Studies: No results for input(s): IRON, TIBC, TRANSFERRIN, FERRITIN in the last 72 hours. Studies/Results: No results found. . sodium chloride   Intravenous Once  . acyclovir  200 mg Oral BID  . alfuzosin  10 mg Oral Q breakfast  . Chlorhexidine Gluconate Cloth  6 each Topical Daily  . furosemide  80 mg Intravenous Q6H  . heparin injection (subcutaneous)  5,000 Units Subcutaneous Q8H  . insulin aspart  0-9 Units Subcutaneous TID WC  . insulin detemir  10 Units Subcutaneous QHS  . pravastatin  20 mg Oral QHS  . sodium bicarbonate  1,300 mg Oral BID  . traZODone  100 mg Oral QHS    BMET    Component Value Date/Time   NA 136 10/02/2020 0407   K 4.8 10/02/2020 0407   CL 96 (L) 10/02/2020 0407   CO2 26 10/02/2020 0407   GLUCOSE 251 (H) 10/02/2020 0407   BUN 47 (H) 10/02/2020 0407   CREATININE 5.73 (H) 10/02/2020 0407   CALCIUM 8.8 (L) 10/02/2020 0407   CALCIUM 10.7 (H) 07/25/2017 0447   GFRNONAA 10 (L) 10/02/2020 0407   GFRAA 30 (L) 07/25/2020 0911   CBC    Component Value Date/Time   WBC 4.9 10/02/2020 0407   RBC 2.79 (L) 10/02/2020 0407   HGB 8.8 (L) 10/02/2020 0407   HCT 26.5 (L) 10/02/2020 0407   PLT 149 (L) 10/02/2020 0407   MCV  95.0 10/02/2020 0407   MCH 31.5 10/02/2020 0407   MCHC 33.2 10/02/2020 0407   RDW 17.2 (H) 10/02/2020 0407   LYMPHSABS 0.3 (L) 09/28/2020 1458   MONOABS 0.3 09/28/2020 1458   EOSABS 0.0 09/28/2020 1458   BASOSABS 0.0 09/28/2020 1458    Assessment/Plan:  1. 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 Scr jumped from baseline of 2.78 on 11/22 up to 9.42 on 09/28/20. He had been on lisinopril 5 mg daily prior to admission and did have some diarrhea as well as progressive increased lower extremity edema.  1. No significant improvement of UOP or BUN/Cr with IV lasixand persistent hyperkalemia. 2. Initiated HD 10/01/20 and again on 10/02/20 3. Starting to have increased UOP.   4. Will hold further dialysis for now and follow UOP and Scr for renal recovery 5. If Scr continues to improve without HD can d/c HD cath and discharge with close follow up. 6. 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 7. LIJ temp HD catheter placed by IR on 09/29/20 2. Hyperkalemia- improving with lokelma and IV lasix 3. Anasarca/volume overload- unclear if he has a history of CHF but low albumin and AKI likely contributing as well as myeloma.  4. HTN 5. Multiple myeloma- currently receiving chemo at the cancer center at [facility] and ok to receive velcade per Pharmacy/Hematology. 6. Anemia- acute on chronic - s/p blood transfusion 09/30/20. Continue to follow 7. Disposition- He had been following Dr. Wolfgang Phoenix but would like to transfer to our [place] office for outpatient follow up of his CKD stage IV  Donetta Potts, MD [employer] [phone]

## 2020-10-03 ENCOUNTER — Ambulatory Visit (HOSPITAL_COMMUNITY): Payer: Medicare Other

## 2020-10-03 ENCOUNTER — Other Ambulatory Visit (HOSPITAL_COMMUNITY): Payer: Medicare Other

## 2020-10-03 ENCOUNTER — Ambulatory Visit (HOSPITAL_COMMUNITY): Payer: Medicare Other | Admitting: Hematology and Oncology

## 2020-10-03 LAB — RENAL FUNCTION PANEL
Albumin: 3.3 g/dL — ABNORMAL LOW (ref 3.5–5.0)
Anion gap: 16 — ABNORMAL HIGH (ref 5–15)
BUN: 71 mg/dL — ABNORMAL HIGH (ref 8–23)
CO2: 26 mmol/L (ref 22–32)
Calcium: 8.8 mg/dL — ABNORMAL LOW (ref 8.9–10.3)
Chloride: 96 mmol/L — ABNORMAL LOW (ref 98–111)
Creatinine, Ser: 7.41 mg/dL — ABNORMAL HIGH (ref 0.61–1.24)
GFR, Estimated: 7 mL/min — ABNORMAL LOW (ref >60)
Glucose, Bld: 115 mg/dL — ABNORMAL HIGH (ref 70–99)
Phosphorus: 7.4 mg/dL — ABNORMAL HIGH (ref 2.5–4.6)
Potassium: 4.2 mmol/L (ref 3.5–5.1)
Sodium: 138 mmol/L (ref 135–145)

## 2020-10-03 LAB — GLUCOSE, CAPILLARY
Glucose-Capillary: 100 mg/dL — ABNORMAL HIGH (ref 70–99)
Glucose-Capillary: 245 mg/dL — ABNORMAL HIGH (ref 70–99)
Glucose-Capillary: 222 mg/dL — ABNORMAL HIGH (ref 70–99)

## 2020-10-03 LAB — HEMOGLOBIN AND HEMATOCRIT, BLOOD
HCT: 26.8 % — ABNORMAL LOW (ref 39.0–52.0)
Hemoglobin: 8.8 g/dL — ABNORMAL LOW (ref 13.0–17.0)

## 2020-10-03 LAB — PHOSPHORUS: Phosphorus: 7.3 mg/dL — ABNORMAL HIGH (ref 2.5–4.6)

## 2020-10-03 LAB — MAGNESIUM: Magnesium: 1.9 mg/dL (ref 1.7–2.4)

## 2020-10-03 MED ORDER — SODIUM CHLORIDE 0.9% FLUSH: 10.0000 mL | INTRAVENOUS | Status: DC | PRN

## 2020-10-03 MED ORDER — HEPARIN SODIUM (PORCINE) 1000 UNIT/ML IJ SOLN
INTRAMUSCULAR | Status: AC
  Filled 2020-10-03: qty 3

## 2020-10-03 MED ORDER — SODIUM CHLORIDE 0.9% FLUSH
10.0000 mL | Freq: Two times a day (BID) | INTRAVENOUS | Status: DC
  Administered 2020-10-03 – 2020-10-05 (×5): 10 mL

## 2020-10-03 MED ORDER — CEFAZOLIN SODIUM-DEXTROSE 2-4 GM/100ML-% IV SOLN
2.0000 g | INTRAVENOUS | Status: AC
  Administered 2020-10-05: 2 g via INTRAVENOUS

## 2020-10-03 NOTE — Plan of Care (Signed)
  Problem: Education: Goal: Knowledge of disease and its progression will improve Outcome: Progressing   Problem: Fluid Volume: Goal: Compliance with measures to maintain balanced fluid volume will improve Outcome: Progressing   Problem: Health Behavior/Discharge Planning: Goal: Ability to manage health-related needs will improve Outcome: Progressing   Problem: Clinical Measurements: Goal: Complications related to the disease process, condition or treatment will be avoided or minimized Outcome: Progressing   Problem: Education: Goal: Knowledge of General Education information will improve Description: Including pain rating scale, medication(s)/side effects and non-pharmacologic comfort measures Outcome: Progressing   Problem: Clinical Measurements: Goal: Will remain free from infection Outcome: Progressing   Problem: Activity: Goal: Risk for activity intolerance will decrease Outcome: Progressing   Problem: Elimination: Goal: Will not experience complications related to bowel motility Outcome: Progressing Goal: Will not experience complications related to urinary retention Outcome: Progressing   Problem: Pain Managment: Goal: General experience of comfort will improve Outcome: Progressing   Problem: Safety: Goal: Ability to remain free from injury will improve Outcome: Progressing   Problem: Education: Goal: Knowledge of disease and its progression will improve Outcome: Progressing   Problem: Health Behavior/Discharge Planning: Goal: Ability to manage health-related needs will improve Outcome: Progressing   Problem: Activity: Goal: Activity intolerance will improve Outcome: Progressing   Problem: Fluid Volume: Goal: Fluid volume balance will be maintained or improved Outcome: Progressing   Problem: Education: Goal: Knowledge of the prescribed therapeutic regimen will improve Outcome: Progressing   Problem: Bowel/Gastric: Goal: Will not experience  complications related to bowel motility Outcome: Progressing   Problem: Nutritional: Goal: Maintenance of adequate nutrition will improve Outcome: Progressing

## 2020-10-03 NOTE — Progress Notes (Signed)
Per pharmacy this pt will receive chemo dose tomorrow in the outpatient center when he's discharged tomorrow. Tom Marshall,

## 2020-10-03 NOTE — Progress Notes (Signed)
Patient has been accepted at [facility] on a TTS schedule with a seat time of 12:30pm. Since he had HD today, he can discharge tomorrow after tunneled catheter placed and have his next HD treatment at the outpatient clinic on Thursday, per Dr. Lurlean Horns. Primary/Dr. Madaline Brilliant and bedside RN also updated. Patient needs to arrive to his first outpatient HD appointment on Thursday, 10/06/20 at 11:30am in order to sign intake paperwork prior to his first treatment.  Renal Navigator discussed above with patient and he stated understanding and no questions. All information was documented in patient's AVS.  Alphonzo Cruise, [place] Renal Navigator [phone]

## 2020-10-03 NOTE — Consult Note (Signed)
Chief Complaint: Patient was seen in consultation today for  Chief Complaint  Patient presents with  . Shortness of Breath    Referring Physician(s): Dr. Augustin Coupe  Supervising Physician: Daryll Brod  Patient Status: [facility] - In-pt  History of Present Illness: ROSHON Marshall is a 69 y.o. male with a medical history significant for right renal cell carcinoma (s/p nephrectomy 1998), multiple myeloma, DM and gout. He presented to the [facility] ED 09/28/20 with shortness of breath and was found to have a creatinine of 9.4 (2.78 nine days ago) and a potassium level of 7.1. He was admitted for acute on chronic renal failure in the setting of active chemotherapy for multiple myeloma. He was seen in IR 09/29/20 for the placement of a temporary dialysis catheter and the patient has received several hemodialysis treatments. The patient is stable for discharge home but will require hemodialysis as an outpatient.    Interventional Radiology has been asked to evaluate this patient for the conversion of his temporary dialysis catheter to a tunneled dialysis catheter; possible new placement of a tunneled dialysis catheter.       Past Medical History:  Diagnosis Date  . Cancer ([place])    multiple myeloma  . Cancer of right kidney ([place])   . Cervical dystonia   . Diabetes mellitus without complication ([place])   . Gout   . Hypercholesteremia     Past Surgical History:  Procedure Laterality Date  . BONE MARROW BIOPSY    . CHOLECYSTECTOMY  2007  . COLONOSCOPY WITH PROPOFOL N/A 01/16/2018   Procedure: COLONOSCOPY WITH PROPOFOL;  Surgeon: Daneil Dolin, MD;  Location: [facility] ENDO SU[facility];  Service: Endoscopy;  Laterality: N/A;  1:45pm  . ESOPHAGOGASTRODUODENOSCOPY (EGD) WITH PROPOFOL N/A 01/16/2018   Procedure: ESOPHAGOGASTRODUODENOSCOPY (EGD) WITH PROPOFOL;  Surgeon: Daneil Dolin, MD;  Location: [facility];  Service: Endoscopy;  Laterality: N/A;  . GIVENS CAPSULE STUDY N/A 05/12/2018   Procedure: GIVENS  CAPSULE STUDY;  Surgeon: Daneil Dolin, MD;  Location: [facility] END[facility] SU[facility];  Service: Endoscopy;  Laterality: N/A;  7:30am  . IR FLUORO GUIDE CV LINE LEFT  09/29/2020  . IR US GUIDE VASC ACCESS LEFT  09/29/2020  . NEPHRECTOMY Right 1998   cancer  . PORTACATH PLACEMENT Right 04/07/2020   Procedure: INSERTION PORT-A-CATH (attached catherter in right internal jugular);  Surgeon: Virl Cagey, MD;  Location: [facility] ORS;  Service: General;  Laterality: Right;    Allergies: Patient has no known allergies.  Medications: Prior to Admission medications   Medication Sig Start Date End Date Taking? Authorizing Provider  acetaminophen (TYLENOL) 325 MG tablet Take 650 mg by mouth every 6 (six) hours as needed.   Yes [provider]  alfuzosin (UROXATRAL) 10 MG 24 hr tablet Take 1 tablet (10 mg total) by mouth daily with breakfast. 07/12/20  Yes Dahlstedt, Annie Main, MD  allopurinol (ZYLOPRIM) 300 MG tablet Take 300 mg by mouth every morning.   Yes [provider]  cyclobenzaprine (FLEXERIL) 10 MG tablet TAKE 1 TABLET THREE TIMES DAILY AS NEEDED FOR MUSCLE SPASM Patient taking differently: Take 10 mg by mouth 3 (three) times daily as needed.  09/21/20  Yes Derek Jack, MD  DARATUMUMAB IV Inject into the vein. Weeks 1-8 weekly x 8 doses; then every 2 weeks x 8 doses; then every 28 days 03/22/20  Yes [provider]  dexamethasone (DECADRON) 4 MG tablet Take 5 tablets (20 mg total) by mouth once a week. 06/27/20  Yes  Derek Jack, MD  dicyclomine (BENTYL) 10 MG capsule Take 10 mg by mouth 2 (two) times daily as needed.  04/12/20  Yes [provider]  diphenoxylate-atropine (LOMOTIL) 2.5-0.025 MG tablet Take 2 tablets by mouth 4 (four) times daily as needed.  07/12/20  Yes [provider]  ELIQUIS 2.5 MG TABS tablet TAKE 1 TABLET BY MOUTH TWICE DAILY AFTER COMPLETION OF THE STARTER PACK. Patient taking differently: Take 2.5 mg by mouth daily.  01/07/20  Yes  Lockamy, Randi L, NP-C  furosemide (LASIX) 40 MG tablet Take 1.5 tablets (60 mg total) by mouth daily. 09/19/20  Yes Derek Jack, MD  gabapentin (NEURONTIN) 300 MG capsule Take 2 capsules (600 mg total) by mouth at bedtime. 07/25/20  Yes Derek Jack, MD  glipiZIDE (GLUCOTROL XL) 10 MG 24 hr tablet TAKE (1) TABLET BY MOUTH DAILY WITH BREAKFAST. Patient taking differently: Take 10 mg by mouth daily with breakfast.  09/14/20  Yes Derek Jack, MD  HYDROcodone-acetaminophen (NORCO/VICODIN) 5-325 MG tablet Take 1 tablet by mouth 2 (two) times daily as needed for severe pain. 07/25/20 07/25/21 Yes Derek Jack, MD  lisinopril (ZESTRIL) 5 MG tablet Take 5 mg by mouth daily. 08/22/20  Yes [provider]  Multiple Vitamins-Minerals (CENTRUM SILVER 50+MEN) TABS Take 1 tablet by mouth every morning.   Yes [provider]  NOVOLOG FLEXPEN 100 UNIT/ML FlexPen Inject 8 Units into the skin 3 (three) times daily with meals.  06/23/20  Yes [provider]  pravastatin (PRAVACHOL) 20 MG tablet Take 20 mg by mouth at bedtime.    Yes [provider]  sodium bicarbonate 650 MG tablet Take 1 tablet (650 mg total) by mouth 2 (two) times daily. Patient taking differently: Take 1,300 mg by mouth 2 (two) times daily.  07/31/17  Yes Kathie Dike, MD  traZODone (DESYREL) 100 MG tablet Take 1 tablet (100 mg total) by mouth at bedtime. 05/30/20  Yes Derek Jack, MD  TRESIBA FLEXTOUCH 200 UNIT/ML SOPN Inject 50 Units into the skin daily.  11/12/18  Yes [provider]  B-D UF III MINI PEN NEEDLES 31G X 5 MM MISC  09/02/18   [provider]  selinexor (XPOVIO) 40 MG TBPK Take 40 mg by mouth once a week. 09/20/20   Derek Jack, MD  TRUE METRIX BLOOD GLUCOSE TEST test strip  07/01/19   [provider]  prochlorperazine (COMPAZINE) 10 MG tablet Take 1 tablet (10 mg total) by mouth every 6 (six) hours as needed (Nausea or  vomiting). 03/17/20 09/26/20  Derek Jack, MD     Family History  Problem Relation Age of Onset  . Heart failure Mother 38  . Dementia Father   . Colon cancer Neg Hx     Social History   Socioeconomic History  . Marital status: Single    Spouse name: Not on file  . Number of children: Not on file  . Years of education: Not on file  . Highest education level: Not on file  Occupational History  . Occupation: Marine scientist, Insurance underwriter  Tobacco Use  . Smoking status: Never Smoker  . Smokeless tobacco: Former Systems developer    Types: Secondary school teacher  . Vaping Use: Never used  Substance and Sexual Activity  . Alcohol use: No  . Drug use: No  . Sexual activity: Not Currently  Other Topics Concern  . Not on file  Social History Narrative  . Not on file   Social Determinants of Health   Financial  Resource Strain: Low Risk   . Difficulty of Paying Living Expenses: Not hard at all  Food Insecurity: No Food Insecurity  . Worried About Charity fundraiser in the Last Year: Never true  . Ran Out of Food in the Last Year: Never true  Transportation Needs: No Transportation Needs  . Lack of Transportation (Medical): No  . Lack of Transportation (Non-Medical): No  Physical Activity: Inactive  . Days of Exercise per Week: 0 days  . Minutes of Exercise per Session: 0 min  Stress: No Stress Concern Present  . Feeling of Stress : Not at all  Social Connections: Moderately Integrated  . Frequency of Communication with Friends and Family: More than three times a week  . Frequency of Social Gatherings with Friends and Family: More than three times a week  . Attends Religious Services: More than 4 times per year  . Active Member of Clubs or Organizations: Yes  . Attends Archivist Meetings: More than 4 times per year  . Marital Status: Never married    Review of Systems: A 12 point ROS discussed and pertinent positives are indicated in the HPI above.  All other systems  are negative.  Review of Systems  Constitutional: Negative for fatigue and fever.  Respiratory: Negative for cough and shortness of breath.   Cardiovascular: Positive for leg swelling. Negative for chest pain.  Gastrointestinal: Negative for abdominal pain, diarrhea, nausea and vomiting.  Genitourinary: Negative for flank pain.  Musculoskeletal: Negative for back pain.  Neurological: Negative for light-headedness and headaches.    Vital Signs: BP 139/65 (BP Location: Right Arm)   Pulse 78   Temp 98.1 F (36.7 C) (Oral)   Resp 18   Ht 5' 7.99" (1.727 m)   Wt 224 lb 3.3 oz (101.7 kg)   SpO2 96%   BMI 34.10 kg/m   Physical Exam Constitutional:      General: He is not in acute distress. HENT:     Mouth/Throat:     Mouth: Mucous membranes are moist.     Pharynx: Oropharynx is clear.  Cardiovascular:     Rate and Rhythm: Normal rate and regular rhythm.     Pulses: Normal pulses.     Heart sounds: Normal heart sounds.     Comments: Right upper chest port-a-cathter; accessed. Left IJ temporary dialysis catheter, currently in use for hemodialysis.  Pulmonary:     Effort: Pulmonary effort is normal.     Breath sounds: Normal breath sounds.  Abdominal:     General: Bowel sounds are normal.     Palpations: Abdomen is soft.  Musculoskeletal:     Right lower leg: Edema present.     Left lower leg: Edema present.  Skin:    General: Skin is warm and dry.  Neurological:     Mental Status: He is alert and oriented to person, place, and time.  Psychiatric:        Mood and Affect: Mood normal.        Behavior: Behavior normal.        Thought Content: Thought content normal.        Judgment: Judgment normal.     Imaging: US RENAL  Result Date: 09/28/2020 CLINICAL DATA:  69 year old male with acute renal insufficiency. Prior right nephrectomy. EXAM: RENAL / URINARY TRACT ULTRASOUND COMPLETE COMPARISON:  None. FINDINGS: Right Kidney: Nephrectomy. Left Kidney: Renal measurements:  12.2 x 6.1 x 5.3 cm = volume: 207 mL. Normal echogenicity. No hydronephrosis or  shadowing stone. There is an 8.3 x 6.8 x 8.8 cm left renal inferior pole cyst. Bladder: Appears normal for degree of bladder distention. Other: There is diffuse increased liver echogenicity most commonly seen in the setting of fatty infiltration. Superimposed inflammation or fibrosis is not excluded. Clinical correlation is recommended. IMPRESSION: 1. Prior right nephrectomy. 2. Large left renal inferior pole cyst. No hydronephrosis or shadowing stone. Electronically Signed   By: Anner Crete M.D.   On: 09/28/2020 23:48   IR Fluoro Guide CV Line Left  Result Date: 09/29/2020 INDICATION: Acute renal insufficiency. Please perform image guided placement of temporary dialysis catheter for the initiation dialysis Note, the decision was made to proceed with placement of a temporary dialysis catheter as there is hope the patient will only temporary hemodialysis per discussion with the providing nephrology service. EXAM: NON-TUNNELED CENTRAL VENOUS HEMODIALYSIS CATHETER PLACEMENT WITH ULTRASOUND AND FLUOROSCOPIC GUIDANCE COMPARISON:  None. MEDICATIONS: None FLUOROSCOPY TIME:  36 seconds (10 mGy) COMPLICATIONS: None immediate. PROCEDURE: Informed written consent was obtained from the patient after a discussion of the risks, benefits, and alternatives to treatment. Questions regarding the procedure were encouraged and answered. Given the presence of the right internal jugular approach port a catheter decision was made to place a left internal jugular approach temporary dialysis catheter As such the left neck and chest were prepped with chlorhexidine in a sterile fashion, and a sterile drape was applied covering the operative field. Maximum barrier sterile technique with sterile gowns and gloves were used for the procedure. A timeout was performed prior to the initiation of the procedure. After the overlying soft tissues were anesthetized,  a small venotomy incision was created and a micropuncture kit was utilized to access the internal jugular vein. Real-time ultrasound guidance was utilized for vascular access including the acquisition of a permanent ultrasound image documenting patency of the accessed vessel. The microwire was utilized to measure appropriate catheter length. A stiff glidewire was advanced to the level of the IVC. Under fluoroscopic guidance, the venotomy was serially dilated, ultimately allowing placement of a 20 cm temporary Mahurkar catheter with tip ultimately terminating within the superior aspect of the right atrium. Final catheter positioning was confirmed and documented with a spot radiographic image. The catheter aspirates and flushes normally. The catheter was flushed with appropriate volume heparin dwells. The catheter exit site was secured with a 0-Prolene retention suture. A dressing was placed. The patient tolerated the procedure well without immediate post procedural complication. IMPRESSION: Successful placement of a left internal jugular approach 20 cm temporary dialysis catheter with tip terminating with in the superior aspect of the right atrium. The catheter is ready for immediate use. PLAN: This catheter may be converted to a tunneled dialysis catheter at a later date as indicated. Electronically Signed   By: Sandi Mariscal M.D.   On: 09/29/2020 15:47   IR US Guide Vasc Access Left  Result Date: 09/29/2020 INDICATION: Acute renal insufficiency. Please perform image guided placement of temporary dialysis catheter for the initiation dialysis Note, the decision was made to proceed with placement of a temporary dialysis catheter as there is hope the patient will only temporary hemodialysis per discussion with the providing nephrology service. EXAM: NON-TUNNELED CENTRAL VENOUS HEMODIALYSIS CATHETER PLACEMENT WITH ULTRASOUND AND FLUOROSCOPIC GUIDANCE COMPARISON:  None. MEDICATIONS: None FLUOROSCOPY TIME:  36 seconds  (10 mGy) COMPLICATIONS: None immediate. PROCEDURE: Informed written consent was obtained from the patient after a discussion of the risks, benefits, and alternatives to treatment. Questions regarding the procedure were  encouraged and answered. Given the presence of the right internal jugular approach port a catheter decision was made to place a left internal jugular approach temporary dialysis catheter As such the left neck and chest were prepped with chlorhexidine in a sterile fashion, and a sterile drape was applied covering the operative field. Maximum barrier sterile technique with sterile gowns and gloves were used for the procedure. A timeout was performed prior to the initiation of the procedure. After the overlying soft tissues were anesthetized, a small venotomy incision was created and a micropuncture kit was utilized to access the internal jugular vein. Real-time ultrasound guidance was utilized for vascular access including the acquisition of a permanent ultrasound image documenting patency of the accessed vessel. The microwire was utilized to measure appropriate catheter length. A stiff glidewire was advanced to the level of the IVC. Under fluoroscopic guidance, the venotomy was serially dilated, ultimately allowing placement of a 20 cm temporary Mahurkar catheter with tip ultimately terminating within the superior aspect of the right atrium. Final catheter positioning was confirmed and documented with a spot radiographic image. The catheter aspirates and flushes normally. The catheter was flushed with appropriate volume heparin dwells. The catheter exit site was secured with a 0-Prolene retention suture. A dressing was placed. The patient tolerated the procedure well without immediate post procedural complication. IMPRESSION: Successful placement of a left internal jugular approach 20 cm temporary dialysis catheter with tip terminating with in the superior aspect of the right atrium. The catheter is  ready for immediate use. PLAN: This catheter may be converted to a tunneled dialysis catheter at a later date as indicated. Electronically Signed   By: Sandi Mariscal M.D.   On: 09/29/2020 15:47   DG Chest Port 1 View  Result Date: 09/28/2020 CLINICAL DATA:  69 year old male with shortness of breath. EXAM: PORTABLE CHEST 1 VIEW COMPARISON:  Chest radiograph dated 05/18/2020. FINDINGS: Right-sided Port-A-Cath in similar position. There is diffuse interstitial coarsening and mild chronic bronchitic changes. There are bibasilar atelectasis/scarring. No focal consolidation, pleural effusion, pneumothorax. Stable cardiac silhouette. No acute osseous pathology. Old rib fractures. IMPRESSION: No acute cardiopulmonary process. Electronically Signed   By: Anner Crete M.D.   On: 09/28/2020 18:30    Labs:  CBC: Recent Labs    09/28/20 1824 09/28/20 1824 09/29/20 0500 09/29/20 1540 09/30/20 0530 10/01/20 0531 10/02/20 0407 10/03/20 0444  WBC 4.3  --  3.3*  --  3.9*  --  4.9  --   HGB 6.7*   < > 6.1*   < > 7.5* 8.1* 8.8* 8.8*  HCT 20.5*   < > 18.2*   < > 22.1* 23.7* 26.5* 26.8*  PLT 110*  --  104*  --  106*  --  149*  --    < > = values in this interval not displayed.    COAGS: Recent Labs    09/28/20 1824  INR 1.2    BMP: Recent Labs    06/13/20 0908 06/13/20 0908 06/27/20 0859 06/27/20 0859 07/11/20 0913 07/11/20 0913 07/25/20 0911 08/08/20 0843 09/30/20 0530 10/01/20 0531 10/02/20 0407 10/03/20 0444  NA 135   < > 135   < > 133*   < > 136   < > 138 138 136 138  K 4.0   < > 3.7   < > 3.4*   < > 3.5   < > 5.6* 4.4 4.8 4.2  CL 97*   < > 100   < > 96*   < >  99   < > 100 99 96* 96*  CO2 26   < > 25   < > 25   < > 25   < > '23 24 26 26  ' GLUCOSE 391*   < > 229*   < > 308*   < > 232*   < > 87 74 251* 115*  BUN 24*   < > 29*   < > 30*   < > 28*   < > 99* 61* 47* 71*  CALCIUM 8.9   < > 8.9   < > 9.1   < > 8.6*   < > 8.0* 8.2* 8.8* 8.8*  CREATININE 2.46*   < > 2.62*   < > 2.43*   <  > 2.45*   < > 10.30* 7.45* 5.73* 7.41*  GFRNONAA 26*   < > 24*   < > 26*   < > 26*   < > 5* 7* 10* 7*  GFRAA 30*  --  27*  --  30*  --  30*  --   --   --   --   --    < > = values in this interval not displayed.    LIVER FUNCTION TESTS: Recent Labs    09/28/20 1458 09/28/20 1458 09/28/20 1824 09/28/20 1824 09/29/20 0500 09/29/20 1540 09/30/20 0530 10/01/20 0531 10/02/20 0407 10/03/20 0444  BILITOT 0.6  --  0.5  --  0.5  --  0.8  --   --   --   AST 16  --  13*  --  9*  --  14*  --   --   --   ALT 31  --  29  --  26  --  28  --   --   --   ALKPHOS 68  --  61  --  57  --  50  --   --   --   PROT 6.1*  --  5.7*  --  5.2*  --  4.9*  --   --   --   ALBUMIN 3.5   < > 3.3*   < > 2.9*   < > 2.9*  2.8* 2.8* 3.3* 3.3*   < > = values in this interval not displayed.    TUMOR MARKERS: No results for input(s): AFPTM, CEA, CA199, CHROMGRNA in the last 8760 hours.  Assessment and Plan:  Acute on chronic renal failure currently requiring hemodialysis: [name], 69 year old male, is scheduled for a temp to tunneled dialysis catheter conversion versus new placement of a tunneled dialysis catheter 10/04/20.   Risks and benefits discussed with the patient including, but not limited to bleeding, infection, vascular injury, pneumothorax which may require chest tube placement, air embolism or even death  All of the patient's questions were answered, patient is agreeable to proceed.  The patient will be NPO after midnight.   Consent signed and in chart.  Thank you for this interesting consult.  I greatly enjoyed meeting EINAR NOLASCO and look forward to participating in their care.  A copy of this report was sent to the requesting provider on this date.  Electronically Signed: Soyla Dryer, AGACNP-BC [phone] 10/03/2020, 2:35 PM   I spent a total of 20 Minutes    in face to face in clinical consultation, greater than 50% of which was counseling/coordinating care for temp to  tunneled dialysis catheter conversion versus new placement.

## 2020-10-03 NOTE — Progress Notes (Addendum)
[facility] Progress Note    Assessment/ Plan:   1. AKI/CKD stage IV- pt with solitary kidney s/p right Nx due to [place] and ongoing treatment for multiple myeloma. Scr jumped from baseline of 2.78 on 11/22 up to 9.42 on 09/28/20. He had been on lisinopril 5 mg daily prior to admission and did have some diarrhea as well as progressive increased lower extremity edema.  1. InitiatedHD12/3/21 and again on 10/01/20 2. Starting to have increased UOP.   3. Will hold further dialysis for nowand follow UOP and Scr for renal recovery; so far no e/o recovery yet.  4. I spoke to the patient at length; safest thing to do is convert to a tunneled catheter, schedule outpt HD and then monitor for renal recovery. He is insisting on leaving today. If he leaves today he will need f/u [facility] in [place] very quickly to transition him to outpt HD if needed -> will also plan on RRT today to buy him time. 5. 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 6. LIJ temp HD catheter placed by IR on 09/29/20 2. Hyperkalemia- improving with lokelma and IV lasix 3. Anasarca/volume overload- unclear if he has a history of CHF but low albumin and AKI likely contributing as well as myeloma.  4. HTN 5. Multiple myeloma- currently receiving chemo at the cancer center at [facility] ok to receive velcade per Pharmacy/Hematology. 6. Anemia- acute on chronic - s/p blood transfusion12/3/21. Continue to follow 7. Disposition- He had been following Dr. Theador Hawthorne but would like to transfer to our [place] office for outpatient follow up of his CKD stage IV  Subjective:   Feels well and denies dyspnea, fever, chills, nausea. Good appetite. No events overnight.   Objective:   BP 130/76 (BP Location: Left Arm)   Pulse 72   Temp 98 F (36.7 C) (Oral)   Resp 16   Ht 5' 7.99" (1.727 m)   Wt 101.3 kg   SpO2 94%   BMI 33.98 kg/m    Intake/Output Summary (Last 24 hours) at 10/03/2020 0735 Last data filed at 10/02/2020 2207 Gross per 24 hour  Intake 580 ml  Output 800 ml  Net -220 ml   Weight change: -3.266 kg  Physical Exam: Gen: NAD CVS: RRR, no rub Resp: cta Abd: obese, +BS, soft, NT/ND Ext: trace pretibial edema Access: LIJ temp  Imaging: No results found.  Labs: BMET Recent Labs  Lab 09/28/20 1824 09/29/20 0500 09/29/20 1540 09/30/20 0530 10/01/20 0531 10/02/20 0407 10/03/20 0444  NA 133* 139 137 138 138 136 138  K 6.8* 6.3* 5.5* 5.6* 4.4 4.8 4.2  CL 103 104 100 100 99 96* 96*  CO2 19* 22 21* '23 24 26 26  ' GLUCOSE 216* 90 172* 87 74 251* 115*  BUN 88* 89* 87* 99* 61* 47* 71*  CREATININE 9.29* 9.59* 9.59* 10.30* 7.45* 5.73* 7.41*  CALCIUM 8.3* 8.4* 8.3* 8.0* 8.2* 8.8* 8.8*  PHOS  --   --  7.3* 8.4* 6.9* 5.6* 7.4*  7.3*   CBC Recent Labs  Lab 09/28/20 1458 09/28/20 1458 09/28/20 1824 09/28/20 1824 09/29/20 0500 09/29/20 1540 09/30/20 0530 10/01/20 0531 10/02/20 0407 10/03/20 0444  WBC 3.5*   < > 4.3  --  3.3*  --  3.9*  --  4.9  --   NEUTROABS 3.0  --   --   --   --   --   --   --   --   --  HGB 7.1*   < > 6.7*   < > 6.1*   < > 7.5* 8.1* 8.8* 8.8*  HCT 22.1*   < > 20.5*   < > 18.2*   < > 22.1* 23.7* 26.5* 26.8*  MCV 105.7*   < > 104.1*  --  101.1*  --  96.5  --  95.0  --   PLT 104*   < > 110*  --  104*  --  106*  --  149*  --    < > = values in this interval not displayed.    Medications:    . sodium chloride   Intravenous Once  . acyclovir  200 mg Oral BID  . alfuzosin  10 mg Oral Q breakfast  . Chlorhexidine Gluconate Cloth  6 each Topical Daily  . furosemide  80 mg Intravenous Q6H  . heparin injection (subcutaneous)  5,000 Units Subcutaneous Q8H  . insulin aspart  0-9 Units Subcutaneous TID WC  . insulin detemir  10 Units Subcutaneous QHS  . pravastatin  20 mg Oral QHS  . sodium bicarbonate  1,300 mg Oral BID  . traZODone  100 mg Oral QHS      Otelia Santee,  MD 10/03/2020, 7:35 AM

## 2020-10-03 NOTE — Progress Notes (Signed)
Renal Navigator received notification from Dr. Lin/Nephrologist that patient needs outpatient HD referral for AKI. Navigator spoke with patient to discuss. He wants to continue with [facility] and asks to be referred to HD clinic in [place]. Navigator explained to patient that he will need to have permanent access placed if HD becomes a permanent need at any time. He agrees. Referral Submitted to [facility] to request treatment at [facility] for AKI.  Navigator will follow closely. Patient reports he is leaving tomorrow no matter what. Navigator explained that we will work on this referral as fast as possible.  Alphonzo Cruise, [place] Renal Navigator [phone]

## 2020-10-03 NOTE — Progress Notes (Signed)
[facility]  [phone] [phone]  ID: [name] OB: 10-25-1951 MR#: [id number] GHW#:[id number] PCP: Asencion Noble, MD  OTHER MD: Dr. Derek Jack (Medical Oncology)  INTERVAL HISTORY: Mr. Tuite underwent dialysis earlier today.  Tolerated it well overall.  He reports that he feels better overall since prior to hospital admission.  Hoping to discharge to home early tomorrow morning.  REVIEW OF SYSTEMS: Today, he reports that his breathing is much better.  Still reports some mild abdominal swelling but improved.  The remainder of the review of systems is noncontributory.  PAST MEDICAL HISTORY: Past Medical History:  Diagnosis Date  . Cancer ([place])    multiple myeloma  . Cancer of right kidney ([place])   . Cervical dystonia   . Diabetes mellitus without complication ([place])   . Gout   . Hypercholesteremia    PAST SURGICAL HISTORY: Past Surgical History:  Procedure Laterality Date  . BONE MARROW BIOPSY    . CHOLECYSTECTOMY  2007  . COLONOSCOPY WITH PROPOFOL N/A 01/16/2018   Procedure: COLONOSCOPY WITH PROPOFOL;  Surgeon: Daneil Dolin, MD;  [facility];  Service: Endoscopy;  Laterality: N/A;  1:45pm  . ESOPHAGOGASTRODUODENOSCOPY (EGD) WITH PROPOFOL N/A 01/16/2018   Procedure: ESOPHAGOGASTRODUODENOSCOPY (EGD) WITH PROPOFOL;  Surgeon: Daneil Dolin, MD;  [facility];  Service: Endoscopy;  Laterality: N/A;  . GIVENS CAPSULE STUDY N/A 05/12/2018   Procedure: GIVENS CAPSULE STUDY;  Surgeon: Daneil Dolin, MD;  Location: [facility];  Service: Endoscopy;  Laterality: N/A;  7:30am  . IR FLUORO GUIDE CV LINE LEFT  09/29/2020  . IR US GUIDE VASC ACCESS LEFT  09/29/2020  . NEPHRECTOMY Right 1998   cancer  . PORTACATH PLACEMENT Right 04/07/2020   Procedure: INSERTION PORT-A-CATH (attached catherter in right internal jugular);  Surgeon: Virl Cagey, MD;  Location: [facility];  Service: General;  Laterality: Right;   FAMILY  HISTORY Family History  Problem Relation Age of Onset  . Heart failure Mother 63  . Dementia Father   . Colon cancer Neg Hx    SOCIAL HISTORY: The patient lives in [place], [place].  He is not married.  He has no children. He has two dogs. He has a sister named, Shihab States, who checks on him.  She is also his healthcare power of attorney.  Has history of alcohol and tobacco use. He has a pilot's license.   ADVANCED DIRECTIVES: He has completed a Living Will and HCPOA. His sister, Trong Gosling is named as his HCPOA.   Social History   Tobacco Use  . Smoking status: Never Smoker  . Smokeless tobacco: Former Systems developer    Types: Secondary school teacher  . Vaping Use: Never used  Substance Use Topics  . Alcohol use: No  . Drug use: No   Colonoscopy: 01/16/2018 Bone density: Lipid panel: No Known Allergies Current Facility-Administered Medications  Medication Dose Route Frequency Provider Last Rate Last Admin  . 0.9 %  sodium chloride infusion (Manually program via Guardrails IV Fluids)   Intravenous Once Caren Griffins, MD      . acetaminophen (TYLENOL) tablet 650 mg  650 mg Oral Q6H PRN Caren Griffins, MD      . acyclovir (ZOVIRAX) 200 MG capsule 200 mg  200 mg Oral BID Blenda Nicely, RPH   200 mg at 10/03/20 7893  . alfuzosin (UROXATRAL) 24 hr tablet 10 mg  10 mg Oral Q breakfast Gherghe, Costin M,  MD   10 mg at 10/03/20 0829  . Chlorhexidine Gluconate Cloth 2 % PADS 6 each  6 each Topical Daily Shawna Clamp, MD   6 each at 10/03/20 0848  . dicyclomine (BENTYL) capsule 10 mg  10 mg Oral BID PRN Caren Griffins, MD      . diphenoxylate-atropine (LOMOTIL) 2.5-0.025 MG per tablet 2 tablet  2 tablet Oral QID PRN Caren Griffins, MD      . furosemide (LASIX) injection 80 mg  80 mg Intravenous Q6H Donato Heinz, MD   80 mg at 10/03/20 0829  . heparin injection 5,000 Units  5,000 Units Subcutaneous Q8H Shawna Clamp, MD   5,000 Units at 10/02/20 2217  .  HYDROcodone-acetaminophen (NORCO/VICODIN) 5-325 MG per tablet 1 tablet  1 tablet Oral BID PRN Caren Griffins, MD      . insulin aspart (novoLOG) injection 0-9 Units  0-9 Units Subcutaneous TID WC Caren Griffins, MD   2 Units at 10/02/20 1656  . insulin detemir (LEVEMIR) injection 10 Units  10 Units Subcutaneous QHS Shawna Clamp, MD   10 Units at 10/02/20 2217  . pravastatin (PRAVACHOL) tablet 20 mg  20 mg Oral QHS Caren Griffins, MD   20 mg at 10/02/20 2218  . sodium bicarbonate tablet 1,300 mg  1,300 mg Oral BID Caren Griffins, MD   1,300 mg at 10/03/20 0829  . sodium chloride flush (NS) 0.9 % injection 10-40 mL  10-40 mL Intracatheter Q12H Shawna Clamp, MD   10 mL at 10/03/20 0902  . sodium chloride flush (NS) 0.9 % injection 10-40 mL  10-40 mL Intracatheter PRN Shawna Clamp, MD      . traZODone (DESYREL) tablet 100 mg  100 mg Oral QHS Caren Griffins, MD   100 mg at 10/02/20 2217   Facility-Administered Medications Ordered in Other Encounters  Medication Dose Route Frequency Provider Last Rate Last Admin  . 0.9 %  sodium chloride infusion  250 mL Intravenous Once Twana First, MD       OBJECTIVE: Vitals:   10/03/20 1000 10/03/20 1056  BP: (!) 142/68 112/65  Pulse:    Resp: 12 12  Temp:    SpO2:     Body mass index is 34.1 kg/m. ECOG FS:1 - Symptomatic but completely ambulatory Ocular: Sclerae unicteric, pupils equal, round and reactive to light Ear-nose-throat: Oropharynx clear, dentition fair Lymphatic: No cervical or supraclavicular adenopathy Lungs no rales or rhonchi, good excursion bilaterally Heart regular rate and rhythm, no murmur appreciated Abd positive bowel sounds, mildly distended, nontender MSK no focal spinal tenderness, no joint edema Neuro: non-focal, well-oriented, appropriate affect Breasts:  LAB RESULTS: CMP     Component Value Date/Time   NA 138 10/03/2020 0444   K 4.2 10/03/2020 0444   CL 96 (L) 10/03/2020 0444   CO2 26 10/03/2020  0444   GLUCOSE 115 (H) 10/03/2020 0444   BUN 71 (H) 10/03/2020 0444   CREATININE 7.41 (H) 10/03/2020 0444   CALCIUM 8.8 (L) 10/03/2020 0444   CALCIUM 10.7 (H) 07/25/2017 0447   PROT 4.9 (L) 09/30/2020 0530   ALBUMIN 3.3 (L) 10/03/2020 0444   AST 14 (L) 09/30/2020 0530   ALT 28 09/30/2020 0530   ALKPHOS 50 09/30/2020 0530   BILITOT 0.8 09/30/2020 0530   GFRNONAA 7 (L) 10/03/2020 0444   GFRAA 30 (L) 07/25/2020 0911   INo results found for: SPEP, UPEP Lab Results  Component Value Date   WBC 4.9 10/02/2020  NEUTROABS 3.0 09/28/2020   HGB 8.8 (L) 10/03/2020   HCT 26.8 (L) 10/03/2020   MCV 95.0 10/02/2020   PLT 149 (L) 10/02/2020   '@LASTCHEMISTRY' @ No results found for: LABCA2 No components found for: LABCA125 Recent Labs  Lab 09/28/20 1824  INR 1.2   Urinalysis    Component Value Date/Time   COLORURINE STRAW (A) 09/28/2020 2109   APPEARANCEUR CLEAR 09/28/2020 2109   APPEARANCEUR Clear 07/12/2020 0938   LABSPEC 1.011 09/28/2020 2109   PHURINE 7.0 09/28/2020 2109   GLUCOSEU 150 (A) 09/28/2020 2109   HGBUR SMALL (A) 09/28/2020 2109   BILIRUBINUR NEGATIVE 09/28/2020 2109   BILIRUBINUR Negative 07/12/2020 [place] NEGATIVE 09/28/2020 2109   PROTEINUR 30 (A) 09/28/2020 2109   NITRITE NEGATIVE 09/28/2020 2109   LEUKOCYTESUR NEGATIVE 09/28/2020 2109   STUDIES: US RENAL  Result Date: 09/28/2020 CLINICAL DATA:  69 year old male with acute renal insufficiency. Prior right nephrectomy. EXAM: RENAL / URINARY TRACT ULTRASOUND COMPLETE COMPARISON:  None. FINDINGS: Right Kidney: Nephrectomy. Left Kidney: Renal measurements: 12.2 x 6.1 x 5.3 cm = volume: 207 mL. Normal echogenicity. No hydronephrosis or shadowing stone. There is an 8.3 x 6.8 x 8.8 cm left renal inferior pole cyst. Bladder: Appears normal for degree of bladder distention. Other: There is diffuse increased liver echogenicity most commonly seen in the setting of fatty infiltration. Superimposed inflammation or  fibrosis is not excluded. Clinical correlation is recommended. IMPRESSION: 1. Prior right nephrectomy. 2. Large left renal inferior pole cyst. No hydronephrosis or shadowing stone. Electronically Signed   By: Anner Crete M.D.   On: 09/28/2020 23:48   IR Fluoro Guide CV Line Left  Result Date: 09/29/2020 INDICATION: Acute renal insufficiency. Please perform image guided placement of temporary dialysis catheter for the initiation dialysis Note, the decision was made to proceed with placement of a temporary dialysis catheter as there is hope the patient will only temporary hemodialysis per discussion with the providing nephrology service. EXAM: NON-TUNNELED CENTRAL VENOUS HEMODIALYSIS CATHETER PLACEMENT WITH ULTRASOUND AND FLUOROSCOPIC GUIDANCE COMPARISON:  None. MEDICATIONS: None FLUOROSCOPY TIME:  36 seconds (10 mGy) COMPLICATIONS: None immediate. PROCEDURE: Informed written consent was obtained from the patient after a discussion of the risks, benefits, and alternatives to treatment. Questions regarding the procedure were encouraged and answered. Given the presence of the right internal jugular approach port a catheter decision was made to place a left internal jugular approach temporary dialysis catheter As such the left neck and chest were prepped with chlorhexidine in a sterile fashion, and a sterile drape was applied covering the operative field. Maximum barrier sterile technique with sterile gowns and gloves were used for the procedure. A timeout was performed prior to the initiation of the procedure. After the overlying soft tissues were anesthetized, a small venotomy incision was created and a micropuncture kit was utilized to access the internal jugular vein. Real-time ultrasound guidance was utilized for vascular access including the acquisition of a permanent ultrasound image documenting patency of the accessed vessel. The microwire was utilized to measure appropriate catheter length. A stiff  glidewire was advanced to the level of the IVC. Under fluoroscopic guidance, the venotomy was serially dilated, ultimately allowing placement of a 20 cm temporary [place] catheter with tip ultimately terminating within the superior aspect of the right atrium. Final catheter positioning was confirmed and documented with a spot radiographic image. The catheter aspirates and flushes normally. The catheter was flushed with appropriate volume heparin dwells. The catheter exit site was secured with a 0-Prolene  retention suture. A dressing was placed. The patient tolerated the procedure well without immediate post procedural complication. IMPRESSION: Successful placement of a left internal jugular approach 20 cm temporary dialysis catheter with tip terminating with in the superior aspect of the right atrium. The catheter is ready for immediate use. PLAN: This catheter may be converted to a tunneled dialysis catheter at a later date as indicated. Electronically Signed   By: Sandi Mariscal M.D.   On: 09/29/2020 15:47   IR US Guide Vasc Access Left  Result Date: 09/29/2020 INDICATION: Acute renal insufficiency. Please perform image guided placement of temporary dialysis catheter for the initiation dialysis Note, the decision was made to proceed with placement of a temporary dialysis catheter as there is hope the patient will only temporary hemodialysis per discussion with the providing nephrology service. EXAM: NON-TUNNELED CENTRAL VENOUS HEMODIALYSIS CATHETER PLACEMENT WITH ULTRASOUND AND FLUOROSCOPIC GUIDANCE COMPARISON:  None. MEDICATIONS: None FLUOROSCOPY TIME:  36 seconds (10 mGy) COMPLICATIONS: None immediate. PROCEDURE: Informed written consent was obtained from the patient after a discussion of the risks, benefits, and alternatives to treatment. Questions regarding the procedure were encouraged and answered. Given the presence of the right internal jugular approach port a catheter decision was made to place a left  internal jugular approach temporary dialysis catheter As such the left neck and chest were prepped with chlorhexidine in a sterile fashion, and a sterile drape was applied covering the operative field. Maximum barrier sterile technique with sterile gowns and gloves were used for the procedure. A timeout was performed prior to the initiation of the procedure. After the overlying soft tissues were anesthetized, a small venotomy incision was created and a micropuncture kit was utilized to access the internal jugular vein. Real-time ultrasound guidance was utilized for vascular access including the acquisition of a permanent ultrasound image documenting patency of the accessed vessel. The microwire was utilized to measure appropriate catheter length. A stiff glidewire was advanced to the level of the IVC. Under fluoroscopic guidance, the venotomy was serially dilated, ultimately allowing placement of a 20 cm temporary [place]kar catheter with tip ultimately terminating within the superior aspect of the right atrium. Final catheter positioning was confirmed and documented with a spot radiographic image. The catheter aspirates and flushes normally. The catheter was flushed with appropriate volume heparin dwells. The catheter exit site was secured with a 0-Prolene retention suture. A dressing was placed. The patient tolerated the procedure well without immediate post procedural complication. IMPRESSION: Successful placement of a left internal jugular approach 20 cm temporary dialysis catheter with tip terminating with in the superior aspect of the right atrium. The catheter is ready for immediate use. PLAN: This catheter may be converted to a tunneled dialysis catheter at a later date as indicated. Electronically Signed   By: Sandi Mariscal M.D.   On: 09/29/2020 15:47   DG Chest Port 1 View  Result Date: 09/28/2020 CLINICAL DATA:  69 year old male with shortness of breath. EXAM: PORTABLE CHEST 1 VIEW COMPARISON:  Chest  radiograph dated 05/18/2020. FINDINGS: Right-sided Port-A-Cath in similar position. There is diffuse interstitial coarsening and mild chronic bronchitic changes. There are bibasilar atelectasis/scarring. No focal consolidation, pleural effusion, pneumothorax. Stable cardiac silhouette. No acute osseous pathology. Old rib fractures. IMPRESSION: No acute cardiopulmonary process. Electronically Signed   By: Anner Crete M.D.   On: 09/28/2020 18:30   ASSESSMENT: 69 y.o. male from [place], [place] with   1) IgG kappa multiple myeloma.             (  a) Revlimid x 4 cycles from 09/05/2017 to 02/18/2018 with Revlimid maintenance.             (b) Pomalyst from 06/20/2020 to 09/19/2020.             (c) Darzalex x 6 cycles from 04/11/2020 to 09/05/2020.             (d) Velcade and Selinexor to begin 10/04/2020  2) Anemia  3) AKI/CKD  PLAN: Mr. Meister continues to feel well.  Underwent dialysis earlier today and tolerated it well.  Will have tunneled dialysis catheter placed tomorrow morning and then discharged home.  He will have outpatient hemodialysis.  He received 1 dose of Velcade on 10/01/2020.  He is scheduled for Velcade and to start selinexor at [facility] on 10/04/2020.  We will plan for the patient to discharge from the hospital and to go directly to the [facility] to receive Velcade and to pick up his selinexor.  Although his patient's appointment is for tomorrow morning, we will accommodate him at whatever time he arrives.  Dr. Lindi Adie is covering the office at [facility] on 12/7 and is aware of the plan.  Further outpatient follow-up from oncology standpoint will be at the [facility] under the direction of Dr. Delton Coombes.  Mikey Bussing, NP 10/03/2020 1:21 PM

## 2020-10-03 NOTE — Progress Notes (Signed)
PROGRESS NOTE    Tom Marshall  ZWC:[id number] DOB: 07-26-1951 DOA: 09/28/2020 PCP: Asencion Noble, MD   Brief Narrative:  This 69 years old male with PMH of renal cell cancer status post nephrectomy 1998 currently with solitary kidney, CKD stage IV with baseline creatinine between 2.5-2.7, diabetes mellitus, hypertension, hyperlipidemia,  multiple myeloma, actively undergoing treatment at [facility] with Dr. Delton Coombes presented to the emergency department with acute shortness of breath .  He was called by his oncologist to go to the emergency department because of worsening renal functions and hyperkalemia. In the ED his blood work shows a potassium 6.8, bicarb 19, BUN 88, creatinine 9.29, BNP 325, hemoglobin 6.7. Nephrology was consulted,  recommended temporary hemodialysis.  Hematology was consulted.  Patient underwent hemodialysis 11/3.  feels better.  His renal function did not recover well,  he probably will need continuation of hemodialysis for now.    Assessment & Plan:   Active Problems:   AKI (acute kidney injury) ([place])   Acute kidney injury on chronic kidney disease stage IV with hyperkalemia: Baseline creatinine around 2.4, currently at 9.0 On-call nephrology, Dr. Moshe Cipro recommended transfer to [facility]. Admitted to stepdown EKG does not show changes of hyperkalemia,  patient was given dextrose and insulin, calcium, as well as has been placed on scheduled Lokelma -Is possible that this is a myeloma kidney given recent concern as an outpatient at the street light chains were increasing.   Obtain light chains, LDH, SPEP,  -Nephrology recommended temporary hemodialysis. -Patient underwent temporary tunneled catheter for hemodialysis by IR -Serum potassium is improving with scheduled Lokelma. -Patient underwent hemodialysis 2 days in a row 12/3- 12/4. -Plan hold on further hemodialysis and to see urine output and see signs of renal recovery. -His serum  creatinine did not improve, next hemodialysis today , 12/6. -Patient agreed to get tunneled catheter for outpatient continuation of hemodialysis. -Outpatient hemodialysis set up has been done in [facility] kidney.  Type 2 diabetes mellitus - Continue long-acting insulin along with sliding scale, -  reduced doses due to renal failure.  Multiple myeloma -Case was discussed over the phone with Dr. Delton Coombes who recommended blood work as above. -Oncology consulted, outpatient chemotherapy for multiple myeloma scheduled in [place]. -Patient has recieved Velcade after hemodialysis 09/01/20.  Essential hypertension -Hold lisinopril   Hyperlipidemia -continue statin  Symptomatic anemia, thrombocytopenia -Likely in the setting of underlying malignancy, transfuse unit of packed red blood cells. Transfuse less compatible PRBC, keep hemoglobin above 7. He is s/p 2 PRBCs posttransfusion hemoglobin 8.8  History of DVT -Patient reports DVT several years ago, has been on Eliquis but only takes it 2.5 mg once daily.   Given that he may need procedures in the next day or so, acute worsening of his renal function hold in place on heparin s.q.    DVT prophylaxis: Heparin subcu Code Status: Full code Family Communication: No one at bedside Disposition Plan:   Status is: Inpatient  Remains inpatient appropriate because:Inpatient level of care appropriate due to severity of illness   Dispo: The patient is from: Home              Anticipated d/c is to: Home              Anticipated d/c date is:  1days              Patient currently is not medically stable to d/c.   Consultants:   Nephrology, interventional radiology, oncology  Procedures: Temporary hemodialysis catheter Antimicrobials: Anti-infectives (From admission, onward)   Start     Dose/Rate Route Frequency Ordered Stop   10/01/20 1000  acyclovir (ZOVIRAX) tablet 200 mg  Status:  Discontinued        200 mg Oral 2  times daily 10/01/20 0852 10/01/20 0937   10/01/20 1000  acyclovir (ZOVIRAX) 200 MG capsule 200 mg        200 mg Oral 2 times daily 10/01/20 2947        Subjective: Patient was seen and examined at bedside..  Overnight events noted.  He wants to be discharged home. He denies any shortness of breath.  He reports feeling better.  His urine output has improved.   Objective: Vitals:   10/03/20 1256 10/03/20 1310 10/03/20 1326 10/03/20 1352  BP: 123/67 124/66 123/65 130/61  Pulse:      Resp: '18 16 17 16  ' Temp:      TempSrc:      SpO2:      Weight:      Height:        Intake/Output Summary (Last 24 hours) at 10/03/2020 1354 Last data filed at 10/03/2020 0800 Gross per 24 hour  Intake 660 ml  Output --  Net 660 ml   Filed Weights   10/01/20 1241 10/03/20 0435 10/03/20 0945  Weight: 103.6 kg 101.3 kg 101.7 kg    Examination:  General exam: Appears calm and comfortable , not in any distress Respiratory system: Clear to auscultation. Respiratory effort normal. Cardiovascular system: S1 & S2 heard, RRR. No JVD, murmurs, rubs, gallops or clicks. No pedal edema. Gastrointestinal system: Abdomen is distended, soft and nontender. No organomegaly or masses felt.  Normal bowel sounds heard. Central nervous system: Alert and oriented. No focal neurological deficits. Extremities: No edema, no cyanosis, no clubbing. Skin: No rashes, lesions or ulcers Psychiatry: Judgement and insight appear normal. Mood & affect appropriate.     Data Reviewed: I have personally reviewed following labs and imaging studies  CBC: Recent Labs  Lab 09/28/20 1458 09/28/20 1458 09/28/20 1824 09/28/20 1824 09/29/20 0500 09/29/20 1540 09/29/20 2330 09/30/20 0530 10/01/20 0531 10/02/20 0407 10/03/20 0444  WBC 3.5*  --  4.3  --  3.3*  --   --  3.9*  --  4.9  --   NEUTROABS 3.0  --   --   --   --   --   --   --   --   --   --   HGB 7.1*   < > 6.7*   < > 6.1*   < > 7.4* 7.5* 8.1* 8.8* 8.8*  HCT  22.1*   < > 20.5*   < > 18.2*   < > 21.9* 22.1* 23.7* 26.5* 26.8*  MCV 105.7*  --  104.1*  --  101.1*  --   --  96.5  --  95.0  --   PLT 104*  --  110*  --  104*  --   --  106*  --  149*  --    < > = values in this interval not displayed.   Basic Metabolic Panel: Recent Labs  Lab 09/29/20 1540 09/30/20 0530 10/01/20 0531 10/02/20 0407 10/03/20 0444  NA 137 138 138 136 138  K 5.5* 5.6* 4.4 4.8 4.2  CL 100 100 99 96* 96*  CO2 21* '23 24 26 26  ' GLUCOSE 172* 87 74 251* 115*  BUN 87* 99* 61* 47* 71*  CREATININE 9.59*  10.30* 7.45* 5.73* 7.41*  CALCIUM 8.3* 8.0* 8.2* 8.8* 8.8*  MG  --  1.7 1.7 1.9 1.9  PHOS 7.3* 8.4* 6.9* 5.6* 7.4*  7.3*   GFR: Estimated Creatinine Clearance: 10.7 mL/min (A) (by C-G formula based on SCr of 7.41 mg/dL (H)). Liver Function Tests: Recent Labs  Lab 09/28/20 1458 09/28/20 1458 09/28/20 1824 09/28/20 1824 09/29/20 0500 09/29/20 0500 09/29/20 1540 09/30/20 0530 10/01/20 0531 10/02/20 0407 10/03/20 0444  AST 16  --  13*  --  9*  --   --  14*  --   --   --   ALT 31  --  29  --  26  --   --  28  --   --   --   ALKPHOS 68  --  61  --  57  --   --  50  --   --   --   BILITOT 0.6  --  0.5  --  0.5  --   --  0.8  --   --   --   PROT 6.1*  --  5.7*  --  5.2*  --   --  4.9*  --   --   --   ALBUMIN 3.5   < > 3.3*   < > 2.9*   < > 2.9* 2.9*  2.8* 2.8* 3.3* 3.3*   < > = values in this interval not displayed.   No results for input(s): LIPASE, AMYLASE in the last 168 hours. No results for input(s): AMMONIA in the last 168 hours. Coagulation Profile: Recent Labs  Lab 09/28/20 1824  INR 1.2   Cardiac Enzymes: No results for input(s): CKTOTAL, CKMB, CKMBINDEX, TROPONINI in the last 168 hours. BNP (last 3 results) No results for input(s): PROBNP in the last 8760 hours. HbA1C: No results for input(s): HGBA1C in the last 72 hours. CBG: Recent Labs  Lab 10/02/20 0736 10/02/20 1235 10/02/20 1629 10/02/20 2127 10/03/20 0737  GLUCAP 277* 188* 185*  271* 100*   Lipid Profile: No results for input(s): CHOL, HDL, LDLCALC, TRIG, CHOLHDL, LDLDIRECT in the last 72 hours. Thyroid Function Tests: No results for input(s): TSH, T4TOTAL, FREET4, T3FREE, THYROIDAB in the last 72 hours. Anemia Panel: No results for input(s): VITAMINB12, FOLATE, FERRITIN, TIBC, IRON, RETICCTPCT in the last 72 hours. Sepsis Labs: No results for input(s): PROCALCITON, LATICACIDVEN in the last 168 hours.  Recent Results (from the past 240 hour(s))  Resp Panel by RT-PCR (Flu A&B, Covid) Nasopharyngeal Swab     Status: None   Collection Time: 09/28/20  6:35 PM   Specimen: Nasopharyngeal Swab; Nasopharyngeal(NP) swabs in vial transport medium  Result Value Ref Range Status   SARS Coronavirus 2 by RT PCR NEGATIVE NEGATIVE Final    Comment: (NOTE) SARS-CoV-2 target nucleic acids are NOT DETECTED.  The SARS-CoV-2 RNA is generally detectable in upper respiratory specimens during the acute phase of infection. The lowest concentration of SARS-CoV-2 viral copies this assay can detect is 138 copies/mL. A negative result does not preclude SARS-Cov-2 infection and should not be used as the sole basis for treatment or other patient management decisions. A negative result may occur with  improper specimen collection/handling, submission of specimen other than nasopharyngeal swab, presence of viral mutation(s) within the areas targeted by this assay, and inadequate number of viral copies(<138 copies/mL). A negative result must be combined with clinical observations, patient history, and epidemiological information. The expected result is Negative.  Fact Sheet for Patients:  EntrepreneurPulse.com.au  Fact Sheet for Healthcare Providers:  IncredibleEmployment.be  This test is no t yet approved or cleared by the [place] FDA and  has been authorized for detection and/or diagnosis of SARS-CoV-2 by FDA under an Emergency Use  Authorization (EUA). This EUA will remain  in effect (meaning this test can be used) for the duration of the COVID-19 declaration under Section 564(b)(1) of the Act, 21 U.S.C.section 360bbb-3(b)(1), unless the authorization is terminated  or revoked sooner.       Influenza A by PCR NEGATIVE NEGATIVE Final   Influenza B by PCR NEGATIVE NEGATIVE Final    Comment: (NOTE) The Xpert Xpress SARS-CoV-2/FLU/RSV plus assay is intended as an aid in the diagnosis of influenza from Nasopharyngeal swab specimens and should not be used as a sole basis for treatment. Nasal washings and aspirates are unacceptable for Xpert Xpress SARS-CoV-2/FLU/RSV testing.  Fact Sheet for Patients: EntrepreneurPulse.com.au  Fact Sheet for Healthcare Providers: IncredibleEmployment.be  This test is not yet approved or cleared by the [place] FDA and has been authorized for detection and/or diagnosis of SARS-CoV-2 by FDA under an Emergency Use Authorization (EUA). This EUA will remain in effect (meaning this test can be used) for the duration of the COVID-19 declaration under Section 564(b)(1) of the Act, 21 U.S.C. section 360bbb-3(b)(1), unless the authorization is terminated or revoked.  Performed at [facility], [address], [place], [place] [address]     Radiology Studies: No results found. Scheduled Meds: . sodium chloride   Intravenous Once  . acyclovir  200 mg Oral BID  . alfuzosin  10 mg Oral Q breakfast  . Chlorhexidine Gluconate Cloth  6 each Topical Daily  . furosemide  80 mg Intravenous Q6H  . heparin injection (subcutaneous)  5,000 Units Subcutaneous Q8H  . heparin sodium (porcine)      . insulin aspart  0-9 Units Subcutaneous TID WC  . insulin detemir  10 Units Subcutaneous QHS  . pravastatin  20 mg Oral QHS  . sodium bicarbonate  1,300 mg Oral BID  . sodium chloride flush  10-40 mL Intracatheter Q12H  . traZODone  100 mg Oral QHS   Continuous  Infusions:   LOS: 5 days    Time spent: 25 mins    Shawna Clamp, MD [facility]   If 7PM-7AM, please contact night-coverage

## 2020-10-03 NOTE — Assessment & Plan Note (Deleted)
69 y.o. male from [place], [place] with  1)IgG kappa multiple myeloma. (a)Revlimid x 4 cycles from 09/05/2017 to 02/18/2018 with Revlimid maintenance. (b)Pomalyst from 06/20/2020 to 09/19/2020. (c)Darzalex x 6 cycles from 04/11/2020 to 09/05/2020. (d)Velcade andSelinexorto begin 10/04/2020  2) Anemia  3) AKI/CKD:  Hospitalization and Hemodialysis received 1 dose of Velcade on 10/01/2020 

## 2020-10-03 NOTE — Care Management Important Message (Signed)
Important Message  Patient Details  Name: VAUGHAN GARFINKLE MRN: [id number] Date of Birth: [birth year]   Medicare Important Message Given:  Yes     Shelda Altes 10/03/2020, 10:35 AM

## 2020-10-04 ENCOUNTER — Ambulatory Visit (HOSPITAL_COMMUNITY): Payer: Medicare Other

## 2020-10-04 ENCOUNTER — Inpatient Hospital Stay (HOSPITAL_COMMUNITY): Payer: Medicare Other | Admitting: Hematology and Oncology

## 2020-10-04 ENCOUNTER — Inpatient Hospital Stay (HOSPITAL_COMMUNITY): Payer: Medicare Other

## 2020-10-04 DIAGNOSIS — C9000 Multiple myeloma not having achieved remission: Secondary | ICD-10-CM | POA: Diagnosis not present

## 2020-10-04 DIAGNOSIS — C9002 Multiple myeloma in relapse: Secondary | ICD-10-CM

## 2020-10-04 DIAGNOSIS — D649 Anemia, unspecified: Secondary | ICD-10-CM | POA: Diagnosis not present

## 2020-10-04 LAB — GLUCOSE, CAPILLARY
Glucose-Capillary: 118 mg/dL — ABNORMAL HIGH (ref 70–99)
Glucose-Capillary: 119 mg/dL — ABNORMAL HIGH (ref 70–99)
Glucose-Capillary: 204 mg/dL — ABNORMAL HIGH (ref 70–99)
Glucose-Capillary: 185 mg/dL — ABNORMAL HIGH (ref 70–99)

## 2020-10-04 LAB — HEMOGLOBIN AND HEMATOCRIT, BLOOD
HCT: 26.9 % — ABNORMAL LOW (ref 39.0–52.0)
Hemoglobin: 8.9 g/dL — ABNORMAL LOW (ref 13.0–17.0)

## 2020-10-04 LAB — RENAL FUNCTION PANEL
Albumin: 3.3 g/dL — ABNORMAL LOW (ref 3.5–5.0)
Anion gap: 16 — ABNORMAL HIGH (ref 5–15)
BUN: 37 mg/dL — ABNORMAL HIGH (ref 8–23)
CO2: 24 mmol/L (ref 22–32)
Calcium: 9 mg/dL (ref 8.9–10.3)
Chloride: 99 mmol/L (ref 98–111)
Creatinine, Ser: 5.33 mg/dL — ABNORMAL HIGH (ref 0.61–1.24)
GFR, Estimated: 11 mL/min — ABNORMAL LOW (ref >60)
Glucose, Bld: 127 mg/dL — ABNORMAL HIGH (ref 70–99)
Phosphorus: 6.6 mg/dL — ABNORMAL HIGH (ref 2.5–4.6)
Potassium: 3.8 mmol/L (ref 3.5–5.1)
Sodium: 139 mmol/L (ref 135–145)

## 2020-10-04 LAB — MAGNESIUM: Magnesium: 1.9 mg/dL (ref 1.7–2.4)

## 2020-10-04 NOTE — Progress Notes (Signed)
PROGRESS NOTE    Tom Marshall  BJY:[id number] DOB: 12-03-1950 DOA: 09/28/2020 PCP: Asencion Noble, MD   Brief Narrative:  This 69 years old male with PMH of renal cell cancer status post nephrectomy in 1998 currently with solitary kidney, CKD stage IV with baseline creatinine between 2.5-2.7, diabetes mellitus, hypertension, hyperlipidemia,  multiple myeloma, actively undergoing treatment at [facility] with Dr. Delton Coombes presented to the emergency department with acute shortness of breath .  He was called by his oncologist to go to the emergency department because of worsening renal functions and hyperkalemia. In the ED his blood work shows a potassium 6.8, bicarb 19, BUN 88, creatinine 9.29, BNP 325, hemoglobin 6.7. Nephrology was consulted,  recommended temporary hemodialysis.  Hematology was consulted.  Patient underwent hemodialysis x 3 . He feels better.  His renal function did not recover well,  he probably will need continuation of hemodialysis for now.    Assessment & Plan:   Active Problems:   AKI (acute kidney injury) ([place])   Acute kidney injury on chronic kidney disease stage IV with hyperkalemia: Baseline creatinine around 2.4, currently at 9.0 On-call nephrology, Dr. Moshe Cipro recommended transfer to [facility]. Admitted to stepdown EKG does not show changes of hyperkalemia,  patient was given dextrose and insulin, calcium, as well as has been placed on scheduled Lokelma -Is possible that this is a myeloma kidney given recent concern as an outpatient at the  light chains were increasing.   -Nephrology recommended temporary hemodialysis. -Patient underwent temporary HD catheter for hemodialysis by IR -Serum potassium is improving with scheduled Lokelma. -Patient underwent hemodialysis 2 days in a row 12/3- 12/4. -Plan hold on further hemodialysis to see urine output and see signs of renal recovery. -His serum creatinine did not improve, next hemodialysis  completed 12/6. -Patient agreed to get tunneled catheter for outpatient continuation of hemodialysis. -Outpatient hemodialysis set up has been done in [facility] kidney. - Scheduled procedure was cancelled today by IR due to emergency cases. - Scheduled for tunnelled catheter tomorrow.  Type 2 diabetes mellitus -  Continue long-acting insulin along with sliding scale, -  reduced doses due to renal failure.  Multiple myeloma -Case was discussed over the phone with Dr. Delton Coombes who recommended blood work as above. -Oncology consulted, outpatient chemotherapy for multiple myeloma scheduled in [place]. -Patient has recieved Velcade after hemodialysis 09/01/20.  Essential hypertension -Hold lisinopril   Hyperlipidemia -continue statin  Symptomatic anemia, thrombocytopenia -Likely in the setting of underlying malignancy, Transfuse less compatible PRBC, keep hemoglobin above 7. He is s/p 2 PRBCs posttransfusion hemoglobin 8.8  History of DVT -Patient reports DVT several years ago, has been on Eliquis but only takes it 2.5 mg once daily.   - Eliquis on hold for procedure.    DVT prophylaxis: Heparin subcu Code Status: Full code Family Communication: No one at bedside Disposition Plan:   Status is: Inpatient  Remains inpatient appropriate because:Inpatient level of care appropriate due to severity of illness   Dispo: The patient is from: Home              Anticipated d/c is to: Home              Anticipated d/c date is:  1days 12/8              Patient currently is not medically stable to d/c.   Consultants:   Nephrology, interventional radiology, oncology  Procedures: Temporary hemodialysis catheter Antimicrobials: Anti-infectives (From admission, onward)  Start     Dose/Rate Route Frequency Ordered Stop   10/04/20 1000  ceFAZolin (ANCEF) IVPB 2g/100 mL premix        2 g 200 mL/hr over 30 Minutes Intravenous To Radiology 10/03/20 1515 10/05/20  1000   10/01/20 1000  acyclovir (ZOVIRAX) tablet 200 mg  Status:  Discontinued        200 mg Oral 2 times daily 10/01/20 0852 10/01/20 0937   10/01/20 1000  acyclovir (ZOVIRAX) 200 MG capsule 200 mg        200 mg Oral 2 times daily 10/01/20 1607        Subjective: Patient was seen and examined at bedside..  Overnight events noted.  Patient is very frustrated with delay in getting tunnel catheter for hemodialysis.   Patient wants to be discharged tomorrow.    Objective: Vitals:   10/03/20 1352 10/03/20 1415 10/03/20 1548 10/04/20 0826  BP: 130/61 139/65 (!) 122/58 (!) 125/58  Pulse:  78 80 72  Resp: _0 Temp: (!) 97.5 F (36.4 C) 98.1 F (36.7 C) 98.2 F (36.8 C) 98.3 F (36.8 C)  TempSrc: Oral Oral Oral Oral  SpO2: 96% 96% 98% 95%  Weight: 100.2 kg     Height:        Intake/Output Summary (Last 24 hours) at 10/04/2020 1456 Last data filed at 10/03/2020 1719 Gross per 24 hour  Intake 250 ml  Output --  Net 250 ml   Filed Weights   10/03/20 0435 10/03/20 0945 10/03/20 1352  Weight: 101.3 kg 101.7 kg 100.2 kg    Examination:  General exam: Appears calm and comfortable , not in any distress, temporary tunnelled catheter. Respiratory system: Clear to auscultation. Respiratory effort normal. Cardiovascular system: S1 & S2 heard, RRR. No JVD, murmurs, rubs, gallops or clicks. No pedal edema. Gastrointestinal system: Abdomen is distended, soft and nontender. No organomegaly or masses felt.  Normal bowel sounds heard. Central nervous system: Alert and oriented. No focal neurological deficits. Extremities: No edema, no cyanosis, no clubbing. Skin: No rashes, lesions or ulcers Psychiatry: Judgement and insight appear normal. Mood & affect appropriate.     Data Reviewed: I have personally reviewed following labs and imaging studies  CBC: Recent Labs  Lab 09/28/20 1458 09/28/20 1458 09/28/20 1824 09/28/20 1824 09/29/20 0500 09/29/20 1540 09/30/20 0530  10/01/20 0531 10/02/20 0407 10/03/20 0444 10/04/20 0830  WBC 3.5*  --  4.3  --  3.3*  --  3.9*  --  4.9  --   --   NEUTROABS 3.0  --   --   --   --   --   --   --   --   --   --   HGB 7.1*   < > 6.7*   < > 6.1*   < > 7.5* 8.1* 8.8* 8.8* 8.9*  HCT 22.1*   < > 20.5*   < > 18.2*   < > 22.1* 23.7* 26.5* 26.8* 26.9*  MCV 105.7*  --  104.1*  --  101.1*  --  96.5  --  95.0  --   --   PLT 104*  --  110*  --  104*  --  106*  --  149*  --   --    < > = values in this interval not displayed.   Basic Metabolic Panel: Recent Labs  Lab 09/30/20 0530 10/01/20 0531 10/02/20 0407 10/03/20 0444 10/04/20 0830  NA 138 138 136 138 139  K 5.6* 4.4 4.8 4.2 3.8  CL 100 99 96* 96* 99  CO2 _0 GLUCOSE 87 74 251* 115* 127*  BUN 99* 61* 47* 71* 37*  CREATININE 10.30* 7.45* 5.73* 7.41* 5.33*  CALCIUM 8.0* 8.2* 8.8* 8.8* 9.0  MG 1.7 1.7 1.9 1.9 1.9  PHOS 8.4* 6.9* 5.6* 7.4*  7.3* 6.6*   GFR: Estimated Creatinine Clearance: 14.8 mL/min (A) (by C-G formula based on SCr of 5.33 mg/dL (H)). Liver Function Tests: Recent Labs  Lab 09/28/20 1458 09/28/20 1458 09/28/20 1824 09/28/20 1824 09/29/20 0500 09/29/20 1540 09/30/20 0530 10/01/20 0531 10/02/20 0407 10/03/20 0444 10/04/20 0830  AST 16  --  13*  --  9*  --  14*  --   --   --   --   ALT 31  --  29  --  26  --  28  --   --   --   --   ALKPHOS 68  --  61  --  57  --  50  --   --   --   --   BILITOT 0.6  --  0.5  --  0.5  --  0.8  --   --   --   --   PROT 6.1*  --  5.7*  --  5.2*  --  4.9*  --   --   --   --   ALBUMIN 3.5   < > 3.3*   < > 2.9*   < > 2.9*  2.8* 2.8* 3.3* 3.3* 3.3*   < > = values in this interval not displayed.   No results for input(s): LIPASE, AMYLASE in the last 168 hours. No results for input(s): AMMONIA in the last 168 hours. Coagulation Profile: Recent Labs  Lab 09/28/20 1824  INR 1.2   Cardiac Enzymes: No results for input(s): CKTOTAL, CKMB, CKMBINDEX, TROPONINI in the last 168 hours. BNP (last 3  results) No results for input(s): PROBNP in the last 8760 hours. HbA1C: No results for input(s): HGBA1C in the last 72 hours. CBG: Recent Labs  Lab 10/03/20 0737 10/03/20 1705 10/03/20 2234 10/04/20 0740 10/04/20 1133  GLUCAP 100* 245* 222* 118* 119*   Lipid Profile: No results for input(s): CHOL, HDL, LDLCALC, TRIG, CHOLHDL, LDLDIRECT in the last 72 hours. Thyroid Function Tests: No results for input(s): TSH, T4TOTAL, FREET4, T3FREE, THYROIDAB in the last 72 hours. Anemia Panel: No results for input(s): VITAMINB12, FOLATE, FERRITIN, TIBC, IRON, RETICCTPCT in the last 72 hours. Sepsis Labs: No results for input(s): PROCALCITON, LATICACIDVEN in the last 168 hours.  Recent Results (from the past 240 hour(s))  Resp Panel by RT-PCR (Flu A&B, Covid) Nasopharyngeal Swab     Status: None   Collection Time: 09/28/20  6:35 PM   Specimen: Nasopharyngeal Swab; Nasopharyngeal(NP) swabs in vial transport medium  Result Value Ref Range Status   SARS Coronavirus 2 by RT PCR NEGATIVE NEGATIVE Final    Comment: (NOTE) SARS-CoV-2 target nucleic acids are NOT DETECTED.  The SARS-CoV-2 RNA is generally detectable in upper respiratory specimens during the acute phase of infection. The lowest concentration of SARS-CoV-2 viral copies this assay can detect is 138 copies/mL. A negative result does not preclude SARS-Cov-2 infection and should not be used as the sole basis for treatment or other patient management decisions. A negative result may occur with  improper specimen collection/handling, submission of specimen other than nasopharyngeal swab, presence of viral mutation(s) within the areas targeted by  this assay, and inadequate number of viral copies(<138 copies/mL). A negative result must be combined with clinical observations, patient history, and epidemiological information. The expected result is Negative.  Fact Sheet for Patients:  EntrepreneurPulse.com.au  Fact  Sheet for Healthcare Providers:  IncredibleEmployment.be  This test is no t yet approved or cleared by the [place] FDA and  has been authorized for detection and/or diagnosis of SARS-CoV-2 by FDA under an Emergency Use Authorization (EUA). This EUA will remain  in effect (meaning this test can be used) for the duration of the COVID-19 declaration under Section 564(b)(1) of the Act, 21 U.S.C.section 360bbb-3(b)(1), unless the authorization is terminated  or revoked sooner.       Influenza A by PCR NEGATIVE NEGATIVE Final   Influenza B by PCR NEGATIVE NEGATIVE Final    Comment: (NOTE) The Xpert Xpress SARS-CoV-2/FLU/RSV plus assay is intended as an aid in the diagnosis of influenza from Nasopharyngeal swab specimens and should not be used as a sole basis for treatment. Nasal washings and aspirates are unacceptable for Xpert Xpress SARS-CoV-2/FLU/RSV testing.  Fact Sheet for Patients: EntrepreneurPulse.com.au  Fact Sheet for Healthcare Providers: IncredibleEmployment.be  This test is not yet approved or cleared by the [place] FDA and has been authorized for detection and/or diagnosis of SARS-CoV-2 by FDA under an Emergency Use Authorization (EUA). This EUA will remain in effect (meaning this test can be used) for the duration of the COVID-19 declaration under Section 564(b)(1) of the Act, 21 U.S.C. section 360bbb-3(b)(1), unless the authorization is terminated or revoked.  Performed at [facility], [address], [place], [place] [address]     Radiology Studies: No results found. Scheduled Meds: . sodium chloride   Intravenous Once  . acyclovir  200 mg Oral BID  . alfuzosin  10 mg Oral Q breakfast  . Chlorhexidine Gluconate Cloth  6 each Topical Daily  . furosemide  80 mg Intravenous Q6H  . heparin injection (subcutaneous)  5,000 Units Subcutaneous Q8H  . insulin aspart  0-9 Units Subcutaneous TID WC  .  insulin detemir  10 Units Subcutaneous QHS  . pravastatin  20 mg Oral QHS  . sodium bicarbonate  1,300 mg Oral BID  . sodium chloride flush  10-40 mL Intracatheter Q12H  . traZODone  100 mg Oral QHS   Continuous Infusions: .  ceFAZolin (ANCEF) IV       LOS: 6 days    Time spent: 25 mins    Shawna Clamp, MD [facility]   If 7PM-7AM, please contact night-coverage

## 2020-10-04 NOTE — Progress Notes (Signed)
IR consulted by Dr. Augustin Coupe for possible image-guided nontunneled to tunneled HD catheter conversion versus new placement- patient was tentatively scheduled for this in IR today.  Unfortunately, there are multiple emergent IR cases today, therefore unable to accommodate patient's procedure- plan for image-guided nontunneled to tunneled HD catheter conversion versus new placement in IR tentatively for tomorrow 10/05/2020 pending IR scheduling. Patient's diet restarted for today, patient to be NPO at midnight. 6E RN made aware.  Please call IR with questions/concerns.   Bea Graff Briyana Badman, PA-C 10/04/2020, 2:08 PM

## 2020-10-04 NOTE — Progress Notes (Signed)
[facility] Progress Note    Assessment/ Plan:   1. AKI/CKD stage IV- pt with solitary kidney s/p right Nx due to [place] and ongoing treatment for multiple myeloma. Scr jumped from baseline of 2.78 on 11/22 up to 9.42 on 09/28/20. He had been on lisinopril 5 mg daily prior to admission and did have some diarrhea as well as progressive increased lower extremity edema.  1. InitiatedHD12/3/21 and again on 10/01/20 2. Starting to have increased UOP. 3. No e/o recovery yet.  4. I spoke to the patient at length yest and fortunately after further d/w primary team and navigator he was willing to stay for the VIR conversion to TC and outpt dialysis as an acute req RRT. 5. He already has a spot a [place] with Dr Geanie Kenning. TTS with next HD on Thursday. 6. LIJ temp HD catheter placed by IR on 09/29/20 and to be converted today. 2. Hyperkalemia- improved with lokelma and IV lasix -> RRT 3. Anasarca/volume overload- unclear if he has a history of CHF but low albumin and AKI likely contributing as well as myeloma.  4. HTN 5. Multiple myeloma- currently receiving chemo at the cancer center at [facility] ok to receive velcade per Pharmacy/Hematology. 6. Anemia- acute on chronic - s/p blood transfusion12/3/21. Continue to follow 7. Disposition- He had been following Dr. Theador Hawthorne but would like to transfer to our [place] office for outpatient follow up of his CKD stage IV   Subjective:   Feels well and denies dyspnea, fever, chills, nausea. Good appetite. Tolerated HD yest No events overnight.    Objective:   BP (!) 122/58 (BP Location: Left Arm)   Pulse 80   Temp 98.2 F (36.8 C) (Oral)   Resp 20   Ht 5' 7.99" (1.727 m)   Wt 100.2 kg   SpO2 98%   BMI 33.60 kg/m   Intake/Output Summary (Last 24 hours) at 10/04/2020 0706 Last data filed at 10/03/2020 1719 Gross per 24 hour  Intake 772 ml  Output 1500 ml  Net -728 ml   Weight change: 0.367 kg  Physical  Exam: Gen:NAD CVS:RRR, no rub Resp:cta ELM:RAJHH, +BS, soft, NT/ND IDU:[id number] pretibial edema Access: LIJ temp  Imaging: No results found.  Labs: BMET Recent Labs  Lab 09/28/20 1824 09/29/20 0500 09/29/20 1540 09/30/20 0530 10/01/20 0531 10/02/20 0407 10/03/20 0444  NA 133* 139 137 138 138 136 138  K 6.8* 6.3* 5.5* 5.6* 4.4 4.8 4.2  CL 103 104 100 100 99 96* 96*  CO2 19* 22 21* _0 GLUCOSE 216* 90 172* 87 74 251* 115*  BUN 88* 89* 87* 99* 61* 47* 71*  CREATININE 9.29* 9.59* 9.59* 10.30* 7.45* 5.73* 7.41*  CALCIUM 8.3* 8.4* 8.3* 8.0* 8.2* 8.8* 8.8*  PHOS  --   --  7.3* 8.4* 6.9* 5.6* 7.4*  7.3*   CBC Recent Labs  Lab 09/28/20 1458 09/28/20 1458 09/28/20 1824 09/28/20 1824 09/29/20 0500 09/29/20 1540 09/30/20 0530 10/01/20 0531 10/02/20 0407 10/03/20 0444  WBC 3.5*   < > 4.3  --  3.3*  --  3.9*  --  4.9  --   NEUTROABS 3.0  --   --   --   --   --   --   --   --   --   HGB 7.1*   < > 6.7*   < > 6.1*   < > 7.5* 8.1* 8.8* 8.8*  HCT 22.1*   < >  20.5*   < > 18.2*   < > 22.1* 23.7* 26.5* 26.8*  MCV 105.7*   < > 104.1*  --  101.1*  --  96.5  --  95.0  --   PLT 104*   < > 110*  --  104*  --  106*  --  149*  --    < > = values in this interval not displayed.    Medications:    . sodium chloride   Intravenous Once  . acyclovir  200 mg Oral BID  . alfuzosin  10 mg Oral Q breakfast  . Chlorhexidine Gluconate Cloth  6 each Topical Daily  . furosemide  80 mg Intravenous Q6H  . heparin injection (subcutaneous)  5,000 Units Subcutaneous Q8H  . insulin aspart  0-9 Units Subcutaneous TID WC  . insulin detemir  10 Units Subcutaneous QHS  . pravastatin  20 mg Oral QHS  . sodium bicarbonate  1,300 mg Oral BID  . sodium chloride flush  10-40 mL Intracatheter Q12H  . traZODone  100 mg Oral QHS      Otelia Santee, MD 10/04/2020, 7:06 AM

## 2020-10-04 NOTE — Progress Notes (Signed)
I met with Mr. Ayo in his room today to review his situation.  He has undergone hemodialysis x3, with some improvement but no resolution of his renal failure.  I believe he will continue to need hemodialysis until his kappa/lambda light chains are controlled (his intact chains are very well controlled at present).  We reviewed the fact that while intact chains are too big to clog the kidneys, the kappa and lambda chains are small enough that they do get filtered through and they will continue to damage to the kidneys until his light chains are controlled.  The plan there is for him to receive Velcade and Selinexor and he is scheduled to meet with our partner Dr. Lindi Adie at the [facility] later today.  Before that he will have a dialysis catheter placed this morning here at [facility].  He has been instructed to go straight from this hospital to the [facility].  He has been scheduled for outpatient hemodialysis Thursday Saturday and Tuesday beginning on 10/06/2020.  I am hoping at some point in the next couple of months his kidneys may resume sufficient function that he may not need further hemodialysis  He will be followed longer-term by Dr. Delton Coombes at [facility].  I will sign off at this point.  Please let me know if I can be of further help.

## 2020-10-05 ENCOUNTER — Ambulatory Visit (HOSPITAL_COMMUNITY): Payer: Medicare Other

## 2020-10-05 ENCOUNTER — Inpatient Hospital Stay (HOSPITAL_COMMUNITY): Payer: Medicare Other

## 2020-10-05 ENCOUNTER — Ambulatory Visit (HOSPITAL_COMMUNITY): Payer: Medicare Other | Admitting: Hematology and Oncology

## 2020-10-05 HISTORY — PX: IR FLUORO GUIDE CV LINE LEFT: IMG2282

## 2020-10-05 HISTORY — PX: IR US GUIDE VASC ACCESS LEFT: IMG2389

## 2020-10-05 LAB — RENAL FUNCTION PANEL
Albumin: 3.4 g/dL — ABNORMAL LOW (ref 3.5–5.0)
Anion gap: 17 — ABNORMAL HIGH (ref 5–15)
BUN: 48 mg/dL — ABNORMAL HIGH (ref 8–23)
CO2: 24 mmol/L (ref 22–32)
Calcium: 8.9 mg/dL (ref 8.9–10.3)
Chloride: 98 mmol/L (ref 98–111)
Creatinine, Ser: 6.89 mg/dL — ABNORMAL HIGH (ref 0.61–1.24)
GFR, Estimated: 8 mL/min — ABNORMAL LOW (ref >60)
Glucose, Bld: 134 mg/dL — ABNORMAL HIGH (ref 70–99)
Phosphorus: 8.5 mg/dL — ABNORMAL HIGH (ref 2.5–4.6)
Potassium: 3.5 mmol/L (ref 3.5–5.1)
Sodium: 139 mmol/L (ref 135–145)

## 2020-10-05 LAB — GLUCOSE, CAPILLARY
Glucose-Capillary: 127 mg/dL — ABNORMAL HIGH (ref 70–99)
Glucose-Capillary: 124 mg/dL — ABNORMAL HIGH (ref 70–99)

## 2020-10-05 LAB — HEMOGLOBIN AND HEMATOCRIT, BLOOD
HCT: 28.7 % — ABNORMAL LOW (ref 39.0–52.0)
Hemoglobin: 9.4 g/dL — ABNORMAL LOW (ref 13.0–17.0)

## 2020-10-05 LAB — MAGNESIUM: Magnesium: 1.9 mg/dL (ref 1.7–2.4)

## 2020-10-05 MED ORDER — FENTANYL CITRATE (PF) 100 MCG/2ML IJ SOLN
INTRAMUSCULAR | Status: AC
  Filled 2020-10-05: qty 2

## 2020-10-05 MED ORDER — TRESIBA FLEXTOUCH 200 UNIT/ML ~~LOC~~ SOPN: 15.0000 [IU] | PEN_INJECTOR | Freq: Every day | SUBCUTANEOUS | Status: AC

## 2020-10-05 MED ORDER — FENTANYL CITRATE (PF) 100 MCG/2ML IJ SOLN
INTRAMUSCULAR | Status: AC | PRN
Start: 2020-10-05 — End: 2020-10-05
  Administered 2020-10-05: 25 ug via INTRAVENOUS

## 2020-10-05 MED ORDER — ALLOPURINOL 100 MG PO TABS: 200.0000 mg | ORAL_TABLET | Freq: Every morning | ORAL | 0 refills | Status: AC

## 2020-10-05 MED ORDER — GABAPENTIN 300 MG PO CAPS: 300.0000 mg | ORAL_CAPSULE | Freq: Every day | ORAL | Status: AC

## 2020-10-05 MED ORDER — CEFAZOLIN SODIUM-DEXTROSE 2-4 GM/100ML-% IV SOLN
INTRAVENOUS | Status: AC
  Filled 2020-10-05: qty 100

## 2020-10-05 MED ORDER — MIDAZOLAM HCL 2 MG/2ML IJ SOLN
INTRAMUSCULAR | Status: AC | PRN
  Administered 2020-10-05: 0.5 mg via INTRAVENOUS

## 2020-10-05 MED ORDER — HEPARIN SOD (PORK) LOCK FLUSH 100 UNIT/ML IV SOLN
500.0000 [IU] | INTRAVENOUS | Status: AC | PRN
  Administered 2020-10-05: 500 [IU]
  Filled 2020-10-05: qty 5

## 2020-10-05 MED ORDER — HEPARIN SODIUM (PORCINE) 1000 UNIT/ML IJ SOLN
INTRAMUSCULAR | Status: AC
  Administered 2020-10-05: 3.8 mL
  Filled 2020-10-05: qty 1

## 2020-10-05 MED ORDER — LIDOCAINE HCL (PF) 1 % IJ SOLN
INTRAMUSCULAR | Status: AC | PRN
  Administered 2020-10-05: 10 mL

## 2020-10-05 MED ORDER — LIDOCAINE HCL 1 % IJ SOLN
INTRAMUSCULAR | Status: AC
  Filled 2020-10-05: qty 20

## 2020-10-05 MED ORDER — MIDAZOLAM HCL 2 MG/2ML IJ SOLN
INTRAMUSCULAR | Status: AC
  Filled 2020-10-05: qty 2

## 2020-10-05 MED ORDER — CHLORHEXIDINE GLUCONATE 4 % EX LIQD
CUTANEOUS | Status: AC
  Filled 2020-10-05: qty 15

## 2020-10-05 MED ORDER — APIXABAN 2.5 MG PO TABS: 2.5000 mg | ORAL_TABLET | Freq: Every day | ORAL | Status: AC

## 2020-10-05 MED ORDER — ACYCLOVIR 200 MG PO CAPS
200.0000 mg | ORAL_CAPSULE | Freq: Two times a day (BID) | ORAL | 0 refills | Status: DC
Start: 2020-10-05 — End: 2020-10-27

## 2020-10-05 NOTE — Discharge Instructions (Signed)

## 2020-10-05 NOTE — Progress Notes (Deleted)
[address] [place], [place] [address]   CLINIC:  Medical Oncology/Hematology  PCP:  Asencion Noble, MD [address] / [place] [address] [phone]   REASON FOR VISIT:  Follow-up for multiple myeloma  PRIOR THERAPY:  1. Revlimid x 4 cycles from 09/05/2017 to 02/18/2018 with Revlimid maintenance. 2. Pomalyst from 06/20/2020 to 09/19/2020. 3. Darzalex x 6 cycles from 04/11/2020 to 09/05/2020. 4. Now on selenixir, bortezomib and dexamethasone.  NGS Results: Not done  CURRENT THERAPY: Velcade and selinexor to begin  BRIEF ONCOLOGIC HISTORY:  Oncology History  Multiple myeloma ([facility])  08/29/2017 Initial Diagnosis   Multiple myeloma ([facility])   12/10/2017 - 03/11/2018 Chemotherapy   The patient had dexamethasone (DECADRON) 4 MG tablet, 1 of 1 cycle, Start date: --, End date: -- lenalidomide (REVLIMID) 25 MG capsule, 1 of 1 cycle, Start date: --, End date: -- bortezomib SQ (VELCADE) chemo injection 2.75 mg, 1.3 mg/m2 = 2.75 mg, Subcutaneous,  Once, 5 of 5 cycles Administration: 2.75 mg (12/10/2017), 2.75 mg (12/17/2017), 2.75 mg (12/31/2017), 2.75 mg (01/21/2018), 2.75 mg (02/18/2018), 2.75 mg (03/11/2018)  for chemotherapy treatment.    04/11/2020 - 09/05/2020 Chemotherapy   The patient had daratumumab (DARZALEX) 800 mg in sodium chloride 0.9 % 960 mL (0.8 mg/mL) chemo infusion, 8 mg/kg = 800 mg (100 % of original dose 8 mg/kg), Intravenous, Once, 1 of 1 cycle Dose modification: 8 mg/kg (original dose 8 mg/kg, Cycle 1, Reason: Provider Judgment, Comment: giving total dose 34m/kg over 2 days) daratumumab (DARZALEX) 800 mg in sodium chloride 0.9 % 460 mL (1.6 mg/mL) chemo infusion, 8 mg/kg = 800 mg (50 % of original dose 16 mg/kg), Intravenous, Once, 2 of 2 cycles Dose modification: 8 mg/kg (original dose 16 mg/kg, Cycle 1, Reason: Provider Judgment, Comment: giving 16 mg/kg over 2 days. thus 8 mg/kg in 2 divided doses.) Administration: 1,600 mg  (04/19/2020), 1,600 mg (05/03/2020), 1,600 mg (05/09/2020), 800 mg (04/11/2020), 800 mg (04/12/2020) daratumumab (DARZALEX) 1,600 mg in sodium chloride 0.9 % 420 mL chemo infusion, 16 mg/kg = 1,600 mg, Intravenous, Once, 5 of 6 cycles Administration: 1,600 mg (05/16/2020), 1,600 mg (05/23/2020), 1,600 mg (05/30/2020), 1,600 mg (06/06/2020), 1,600 mg (06/13/2020), 1,600 mg (06/27/2020), 1,600 mg (07/11/2020), 1,600 mg (07/25/2020), 1,600 mg (08/08/2020), 1,600 mg (08/22/2020), 1,600 mg (09/05/2020)  for chemotherapy treatment.    09/30/2020 -  Chemotherapy   The patient had dexamethasone (DECADRON) tablet 20 mg, 20 mg, Oral,  Once, 1 of 4 cycles Administration: 20 mg (10/01/2020) bortezomib SQ (VELCADE) chemo injection (2.578mmL concentration) 3 mg, 1.3 mg/m2 = 3 mg, Subcutaneous,  Once, 1 of 4 cycles Administration: 3 mg (10/01/2020)  for chemotherapy treatment.      CANCER STAGING: Cancer Staging No matching staging information was found for the patient.  INTERVAL HISTORY:  Mr. WiJARRIUS HUARACHAa [age].o. male, returns for routine follow-up and consideration for next cycle of treatment.  Abdominal symptoms Chest pain Neuropathy?  He will stop taking Darzalex and will start Velcade and selinexor next week.   REVIEW OF SYSTEMS:  Review of Systems  Constitutional: Positive for appetite change (50%) and fatigue (50%).  Respiratory: Positive for shortness of breath.   Gastrointestinal: Positive for abdominal pain (20 minutes after eating) and diarrhea.  Neurological: Positive for numbness (& tingling in fingertips).  All other systems reviewed and are negative.   PAST MEDICAL/SURGICAL HISTORY:  Past Medical History:  Diagnosis Date  . Cancer ([facility] [place]   multiple myeloma  .  Cancer of right kidney ([facility])   . Cervical dystonia   . Diabetes mellitus without complication ([facility])   . Gout   . Hypercholesteremia    Past Surgical History:  Procedure Laterality Date  . BONE MARROW BIOPSY    .  CHOLECYSTECTOMY  2007  . COLONOSCOPY WITH PROPOFOL N/A 01/16/2018   Procedure: COLONOSCOPY WITH PROPOFOL;  Surgeon: Daneil Dolin, MD;  [facility];  Service: Endoscopy;  Laterality: N/A;  1:45pm  . ESOPHAGOGASTRODUODENOSCOPY (EGD) WITH PROPOFOL N/A 01/16/2018   Procedure: ESOPHAGOGASTRODUODENOSCOPY (EGD) WITH PROPOFOL;  Surgeon: Daneil Dolin, MD;  Location: [facility];  Service: Endoscopy;  Laterality: N/A;  . GIVENS CAPSULE STUDY N/A 05/12/2018   Procedure: GIVENS CAPSULE STUDY;  Surgeon: Daneil Dolin, MD;  Location: [facility];  Service: Endoscopy;  Laterality: N/A;  7:30am  . IR FLUORO GUIDE CV LINE LEFT  09/29/2020  . IR US GUIDE VASC ACCESS LEFT  09/29/2020  . NEPHRECTOMY Right 1998   cancer  . PORTACATH PLACEMENT Right 04/07/2020   Procedure: INSERTION PORT-A-CATH (attached catherter in right internal jugular);  Surgeon: Virl Cagey, MD;  Location: [facility];  Service: General;  Laterality: Right;    SOCIAL HISTORY:  Social History   Socioeconomic History  . Marital status: Single    Spouse name: Not on file  . Number of children: Not on file  . Years of education: Not on file  . Highest education level: Not on file  Occupational History  . Occupation: Marine scientist, Insurance underwriter  Tobacco Use  . Smoking status: Never Smoker  . Smokeless tobacco: Former Systems developer    Types: Secondary school teacher  . Vaping Use: Never used  Substance and Sexual Activity  . Alcohol use: No  . Drug use: No  . Sexual activity: Not Currently  Other Topics Concern  . Not on file  Social History Narrative  . Not on file   Social Determinants of Health   Financial Resource Strain: Low Risk   . Difficulty of Paying Living Expenses: Not hard at all  Food Insecurity: No Food Insecurity  . Worried About Charity fundraiser in the Last Year: Never true  . Ran Out of Food in the Last Year: Never true  Transportation Needs: No Transportation Needs  . Lack of Transportation  (Medical): No  . Lack of Transportation (Non-Medical): No  Physical Activity: Inactive  . Days of Exercise per Week: 0 days  . Minutes of Exercise per Session: 0 min  Stress: No Stress Concern Present  . Feeling of Stress : Not at all  Social Connections: Moderately Integrated  . Frequency of Communication with Friends and Family: More than three times a week  . Frequency of Social Gatherings with Friends and Family: More than three times a week  . Attends Religious Services: More than 4 times per year  . Active Member of Clubs or Organizations: Yes  . Attends Archivist Meetings: More than 4 times per year  . Marital Status: Never married  Intimate Partner Violence: Not At Risk  . Fear of Current or Ex-Partner: No  . Emotionally Abused: No  . Physically Abused: No  . Sexually Abused: No    FAMILY HISTORY:  Family History  Problem Relation Age of Onset  . Heart failure Mother 57  . Dementia Father   . Colon cancer Neg Hx     CURRENT MEDICATIONS:  No current facility-administered medications for this visit.   No  current outpatient medications on file.   Facility-Administered Medications Ordered in Other Visits  Medication Dose Route Frequency Provider Last Rate Last Admin  . 0.9 %  sodium chloride infusion (Manually program via Guardrails IV Fluids)   Intravenous Once Caren Griffins, MD      . 0.9 %  sodium chloride infusion  250 mL Intravenous Once Twana First, MD      . acetaminophen (TYLENOL) tablet 650 mg  650 mg Oral Q6H PRN Caren Griffins, MD      . acyclovir (ZOVIRAX) 200 MG capsule 200 mg  200 mg Oral BID Blenda Nicely, RPH   200 mg at 10/04/20 2051  . alfuzosin (UROXATRAL) 24 hr tablet 10 mg  10 mg Oral Q breakfast Caren Griffins, MD   10 mg at 10/03/20 0829  . ceFAZolin (ANCEF) IVPB 2g/100 mL premix  2 g Intravenous to XRAY [place], Ardeth Perfect, NP      . Chlorhexidine Gluconate Cloth 2 % PADS 6 each  6 each Topical Daily Shawna Clamp, MD   6  each at 10/04/20 0845  . dicyclomine (BENTYL) capsule 10 mg  10 mg Oral BID PRN Caren Griffins, MD      . diphenoxylate-atropine (LOMOTIL) 2.5-0.025 MG per tablet 2 tablet  2 tablet Oral QID PRN Caren Griffins, MD      . furosemide (LASIX) injection 80 mg  80 mg Intravenous Q6H Donato Heinz, MD   80 mg at 10/05/20 0240  . heparin injection 5,000 Units  5,000 Units Subcutaneous Q8H Shawna Clamp, MD   5,000 Units at 10/05/20 0453  . HYDROcodone-acetaminophen (NORCO/VICODIN) 5-325 MG per tablet 1 tablet  1 tablet Oral BID PRN Caren Griffins, MD      . insulin aspart (novoLOG) injection 0-9 Units  0-9 Units Subcutaneous TID WC Caren Griffins, MD   3 Units at 10/04/20 1658  . insulin detemir (LEVEMIR) injection 10 Units  10 Units Subcutaneous QHS Shawna Clamp, MD   10 Units at 10/04/20 2200  . pravastatin (PRAVACHOL) tablet 20 mg  20 mg Oral QHS Caren Griffins, MD   20 mg at 10/04/20 2051  . sodium bicarbonate tablet 1,300 mg  1,300 mg Oral BID Caren Griffins, MD   1,300 mg at 10/04/20 2051  . sodium chloride flush (NS) 0.9 % injection 10-40 mL  10-40 mL Intracatheter Q12H Shawna Clamp, MD   10 mL at 10/04/20 2052  . sodium chloride flush (NS) 0.9 % injection 10-40 mL  10-40 mL Intracatheter PRN Shawna Clamp, MD      . traZODone (DESYREL) tablet 100 mg  100 mg Oral QHS Caren Griffins, MD   100 mg at 10/04/20 2052    ALLERGIES:  No Known Allergies  PHYSICAL EXAM:  Performance status (ECOG): 1 - Symptomatic but completely ambulatory  There were no vitals filed for this visit. Wt Readings from Last 3 Encounters:  10/05/20 214 lb 1.6 oz (97.1 kg)  09/19/20 235 lb 3.7 oz (106.7 kg)  09/05/20 233 lb 6.4 oz (105.9 kg)   Physical Exam Vitals reviewed.  Constitutional:      Appearance: Normal appearance. He is obese.  Cardiovascular:     Rate and Rhythm: Normal rate and regular rhythm.     Pulses: Normal pulses.     Heart sounds: Normal heart sounds.   Pulmonary:     Effort: Pulmonary effort is normal.     Breath sounds: Normal breath sounds.  Abdominal:  Palpations: Abdomen is soft.     Tenderness: There is no abdominal tenderness.  Musculoskeletal:     Thoracic back: No tenderness or bony tenderness.     Right lower leg: No edema.     Left lower leg: No edema.  Neurological:     General: No focal deficit present.     Mental Status: He is alert and oriented to person, place, and time.  Psychiatric:        Mood and Affect: Mood normal.        Behavior: Behavior normal.     LABORATORY DATA:  I have reviewed the labs as listed.  CBC Latest Ref Rng & Units 10/05/2020 10/04/2020 10/03/2020  WBC 4.0 - 10.5 K/uL - - -  Hemoglobin 13.0 - 17.0 g/dL 9.4(L) 8.9(L) 8.8(L)  Hematocrit 39 - 52 % 28.7(L) 26.9(L) 26.8(L)  Platelets 150 - 400 K/uL - - -   CMP Latest Ref Rng & Units 10/05/2020 10/04/2020 10/03/2020  Glucose 70 - 99 mg/dL 134(H) 127(H) 115(H)  BUN 8 - 23 mg/dL 48(H) 37(H) 71(H)  Creatinine 0.61 - 1.24 mg/dL 6.89(H) 5.33(H) 7.41(H)  Sodium 135 - 145 mmol/L 139 139 138  Potassium 3.5 - 5.1 mmol/L 3.5 3.8 4.2  Chloride 98 - 111 mmol/L 98 99 96(L)  CO2 22 - 32 mmol/L '24 24 26  ' Calcium 8.9 - 10.3 mg/dL 8.9 9.0 8.8(L)  Total Protein 6.5 - 8.1 g/dL - - -  Total Bilirubin 0.3 - 1.2 mg/dL - - -  Alkaline Phos 38 - 126 U/L - - -  AST 15 - 41 U/L - - -  ALT 0 - 44 U/L - - -   Lab Results  Component Value Date   LDH 132 09/28/2020   LDH 99 09/19/2020   LDH 104 09/05/2020   Lab Results  Component Value Date   TOTALPROTELP 5.5 (L) 09/28/2020   ALBUMINELP 3.1 09/28/2020   A1GS 0.2 09/28/2020   A2GS 0.8 09/28/2020   BETS 0.8 09/28/2020   GAMS 0.5 09/28/2020   MSPIKE 0.2 (H) 09/28/2020   SPEI Comment 09/28/2020    Lab Results  Component Value Date   KPAFRELGTCHN 5,360.7 (H) 09/28/2020   LAMBDASER 7.8 09/28/2020   KAPLAMBRATIO 687.27 (H) 09/28/2020    DIAGNOSTIC IMAGING:  I have independently reviewed the scans and  discussed with the patient. US RENAL  Result Date: 09/28/2020 CLINICAL DATA:  69 year old male with acute renal insufficiency. Prior right nephrectomy. EXAM: RENAL / URINARY TRACT ULTRASOUND COMPLETE COMPARISON:  None. FINDINGS: Right Kidney: Nephrectomy. Left Kidney: Renal measurements: 12.2 x 6.1 x 5.3 cm = volume: 207 mL. Normal echogenicity. No hydronephrosis or shadowing stone. There is an 8.3 x 6.8 x 8.8 cm left renal inferior pole cyst. Bladder: Appears normal for degree of bladder distention. Other: There is diffuse increased liver echogenicity most commonly seen in the setting of fatty infiltration. Superimposed inflammation or fibrosis is not excluded. Clinical correlation is recommended. IMPRESSION: 1. Prior right nephrectomy. 2. Large left renal inferior pole cyst. No hydronephrosis or shadowing stone. Electronically Signed   By: Anner Crete M.D.   On: 09/28/2020 23:48   IR Fluoro Guide CV Line Left  Result Date: 09/29/2020 INDICATION: Acute renal insufficiency. Please perform image guided placement of temporary dialysis catheter for the initiation dialysis Note, the decision was made to proceed with placement of a temporary dialysis catheter as there is hope the patient will only temporary hemodialysis per discussion with the providing nephrology service. EXAM: NON-TUNNELED CENTRAL  VENOUS HEMODIALYSIS CATHETER PLACEMENT WITH ULTRASOUND AND FLUOROSCOPIC GUIDANCE COMPARISON:  None. MEDICATIONS: None FLUOROSCOPY TIME:  36 seconds (10 mGy) COMPLICATIONS: None immediate. PROCEDURE: Informed written consent was obtained from the patient after a discussion of the risks, benefits, and alternatives to treatment. Questions regarding the procedure were encouraged and answered. Given the presence of the right internal jugular approach port a catheter decision was made to place a left internal jugular approach temporary dialysis catheter As such the left neck and chest were prepped with chlorhexidine in  a sterile fashion, and a sterile drape was applied covering the operative field. Maximum barrier sterile technique with sterile gowns and gloves were used for the procedure. A timeout was performed prior to the initiation of the procedure. After the overlying soft tissues were anesthetized, a small venotomy incision was created and a micropuncture kit was utilized to access the internal jugular vein. Real-time ultrasound guidance was utilized for vascular access including the acquisition of a permanent ultrasound image documenting patency of the accessed vessel. The microwire was utilized to measure appropriate catheter length. A stiff glidewire was advanced to the level of the IVC. Under fluoroscopic guidance, the venotomy was serially dilated, ultimately allowing placement of a 20 cm temporary Mahurkar catheter with tip ultimately terminating within the superior aspect of the right atrium. Final catheter positioning was confirmed and documented with a spot radiographic image. The catheter aspirates and flushes normally. The catheter was flushed with appropriate volume heparin dwells. The catheter exit site was secured with a 0-Prolene retention suture. A dressing was placed. The patient tolerated the procedure well without immediate post procedural complication. IMPRESSION: Successful placement of a left internal jugular approach 20 cm temporary dialysis catheter with tip terminating with in the superior aspect of the right atrium. The catheter is ready for immediate use. PLAN: This catheter may be converted to a tunneled dialysis catheter at a later date as indicated. Electronically Signed   By: Sandi Mariscal M.D.   On: 09/29/2020 15:47   IR US Guide Vasc Access Left  Result Date: 09/29/2020 INDICATION: Acute renal insufficiency. Please perform image guided placement of temporary dialysis catheter for the initiation dialysis Note, the decision was made to proceed with placement of a temporary dialysis catheter  as there is hope the patient will only temporary hemodialysis per discussion with the providing nephrology service. EXAM: NON-TUNNELED CENTRAL VENOUS HEMODIALYSIS CATHETER PLACEMENT WITH ULTRASOUND AND FLUOROSCOPIC GUIDANCE COMPARISON:  None. MEDICATIONS: None FLUOROSCOPY TIME:  36 seconds (10 mGy) COMPLICATIONS: None immediate. PROCEDURE: Informed written consent was obtained from the patient after a discussion of the risks, benefits, and alternatives to treatment. Questions regarding the procedure were encouraged and answered. Given the presence of the right internal jugular approach port a catheter decision was made to place a left internal jugular approach temporary dialysis catheter As such the left neck and chest were prepped with chlorhexidine in a sterile fashion, and a sterile drape was applied covering the operative field. Maximum barrier sterile technique with sterile gowns and gloves were used for the procedure. A timeout was performed prior to the initiation of the procedure. After the overlying soft tissues were anesthetized, a small venotomy incision was created and a micropuncture kit was utilized to access the internal jugular vein. Real-time ultrasound guidance was utilized for vascular access including the acquisition of a permanent ultrasound image documenting patency of the accessed vessel. The microwire was utilized to measure appropriate catheter length. A stiff glidewire was advanced to the level of the IVC.  Under fluoroscopic guidance, the venotomy was serially dilated, ultimately allowing placement of a 20 cm temporary Mahurkar catheter with tip ultimately terminating within the superior aspect of the right atrium. Final catheter positioning was confirmed and documented with a spot radiographic image. The catheter aspirates and flushes normally. The catheter was flushed with appropriate volume heparin dwells. The catheter exit site was secured with a 0-Prolene retention suture. A dressing  was placed. The patient tolerated the procedure well without immediate post procedural complication. IMPRESSION: Successful placement of a left internal jugular approach 20 cm temporary dialysis catheter with tip terminating with in the superior aspect of the right atrium. The catheter is ready for immediate use. PLAN: This catheter may be converted to a tunneled dialysis catheter at a later date as indicated. Electronically Signed   By: Sandi Mariscal M.D.   On: 09/29/2020 15:47   DG Chest Port 1 View  Result Date: 09/28/2020 CLINICAL DATA:  69 year old male with shortness of breath. EXAM: PORTABLE CHEST 1 VIEW COMPARISON:  Chest radiograph dated 05/18/2020. FINDINGS: Right-sided Port-A-Cath in similar position. There is diffuse interstitial coarsening and mild chronic bronchitic changes. There are bibasilar atelectasis/scarring. No focal consolidation, pleural effusion, pneumothorax. Stable cardiac silhouette. No acute osseous pathology. Old rib fractures. IMPRESSION: No acute cardiopulmonary process. Electronically Signed   By: Anner Crete M.D.   On: 09/28/2020 18:30     ASSESSMENT:  1. IgG kappa multiple myeloma: -4 cycles of RVD from 08/30/2017 through 02/18/2018. -Declined bone marrow transplant. Received maintenance Revlimid 2.5 mg 3 weeks on 1 week off. -Bone marrow biopsy on 02/23/2020 shows 21% plasma cells. Occasional sections had up to 75% myeloma cells. -Chromosome analysis was normal. FISH panel was positive for gain of 1 q., monosomy 13, del 17 P, t(14;20) -PET scan showed new left frontal destructive lesion. Numerous areas of lytic changes in the spine. Lucent areas in T6 with loss of height at T5. Numerous lytic changes throughout the spine noted along with pedicle and lamina and transverse process of T8 vertebral body. Profound hypermetabolic activity within the ribs bilaterally. -Daratumumab was started on 04/11/2020.Pomalidomide 2 mg 3 weeks on 1 week off started on  06/20/2020.  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 -Completed XRT on 04/05/2020. -MRI thoracic spine on 07/26/2020 showed significant overall improvement in multiple myeloma with much of the bone marrow has returned to normal fatty marrow.  Ventral epidural tumor on the right of T5 near completely resolved.  Proximal right T7 rib lesion also resolved.  3. 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 He is on Eliquis.   PLAN:  1. IgG kappa multiple myeloma: -We have reviewed myeloma labs from 09/05/2020.  M spike is stable at 0.1 g.  However kappa free light chains increased to 2637 and ratio increased to 561.  Immunofixation shows IgG kappa. -Based on worsening of last 2 free light chain ratios, I have recommended switching treatments. -Because of his high risk FISH panel, I have recommended regimen containing Proteasome inhibitor and a novel agent. -We discussed selinexor, bortezomib and dexamethasone regimen.  We will start him on selinexor low-dose 40 mg weekly and titrated up as tolerated.  Bortezomib is 1.3 mg per metered square once weekly for 4 weeks every 35 days.  He will continue  dexamethasone 20 mg once weekly. 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  We will closely monitor.  If there is any worsening, will cut back on dose of bortezomib. Dr Raliegh Ip We discussed side effects of selinexor in detail including but not limited to GI toxicity, fluid retention, cytopenias among others.  We will see him back in 2 weeks after start of treatment to see how he is tolerating.  2. CKD, stage V, creatinine worsening, may need dialysis soon.  3. Left leg DVT: -Continue Eliquis 2.5 mg daily.  No bleeding  issues.  4.Diabetes: -Continue Tresiba and NovoLog.  Continue follow-up with Dr. Willey Blade.  5. Insomnia: -Continue trazodone 100 mg at bedtime.  6. Lower extremity swelling: -Continue Lasix 40 mg daily.  Continue compression stockings.  7. Urinary hesitancy: -Continue Flomax 0.4 mg daily.  8. 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  He is not seeing any difference.  He will stop gabapentin.     Orders placed this encounter:  No orders of the defined types were placed in this encounter.

## 2020-10-05 NOTE — Sedation Documentation (Signed)
Patient transported to [facility] via transporter.

## 2020-10-05 NOTE — Procedures (Signed)
Interventional Radiology Procedure Note  Procedure: Tunneled HD catheter placement  Complications: None  Estimated Blood Loss: < 10 mL  Findings: Left neck temp cath removed. Left IJ tunneled Palindrome catheter measuring 23 cm from tip to cuff placed with tip in RA. OK to use.  Venetia Night. Kathlene Cote, M.D Pager:  [phone]

## 2020-10-05 NOTE — Plan of Care (Signed)
  Problem: Education: Goal: Knowledge of disease and its progression will improve Outcome: Adequate for Discharge Goal: Individualized Educational Video(s) Outcome: Adequate for Discharge   Problem: Fluid Volume: Goal: Compliance with measures to maintain balanced fluid volume will improve Outcome: Adequate for Discharge   Problem: Health Behavior/Discharge Planning: Goal: Ability to manage health-related needs will improve Outcome: Adequate for Discharge   Problem: Nutritional: Goal: Ability to make healthy dietary choices will improve Outcome: Adequate for Discharge   Problem: Clinical Measurements: Goal: Complications related to the disease process, condition or treatment will be avoided or minimized Outcome: Adequate for Discharge   Problem: Education: Goal: Knowledge of General Education information will improve Description: Including pain rating scale, medication(s)/side effects and non-pharmacologic comfort measures Outcome: Adequate for Discharge   Problem: Health Behavior/Discharge Planning: Goal: Ability to manage health-related needs will improve Outcome: Adequate for Discharge   Problem: Clinical Measurements: Goal: Ability to maintain clinical measurements within normal limits will improve Outcome: Adequate for Discharge Goal: Will remain free from infection Outcome: Adequate for Discharge Goal: Diagnostic test results will improve Outcome: Adequate for Discharge Goal: Respiratory complications will improve Outcome: Adequate for Discharge Goal: Cardiovascular complication will be avoided Outcome: Adequate for Discharge   Problem: Activity: Goal: Risk for activity intolerance will decrease Outcome: Adequate for Discharge   Problem: Nutrition: Goal: Adequate nutrition will be maintained Outcome: Adequate for Discharge   Problem: Coping: Goal: Level of anxiety will decrease Outcome: Adequate for Discharge   Problem: Elimination: Goal: Will not  experience complications related to bowel motility Outcome: Adequate for Discharge Goal: Will not experience complications related to urinary retention Outcome: Adequate for Discharge   Problem: Pain Managment: Goal: General experience of comfort will improve Outcome: Adequate for Discharge   Problem: Safety: Goal: Ability to remain free from injury will improve Outcome: Adequate for Discharge   Problem: Skin Integrity: Goal: Risk for impaired skin integrity will decrease Outcome: Adequate for Discharge   Problem: Education: Goal: Knowledge of disease and its progression will improve Outcome: Adequate for Discharge   Problem: Health Behavior/Discharge Planning: Goal: Ability to manage health-related needs will improve Outcome: Adequate for Discharge   Problem: Clinical Measurements: Goal: Complications related to the disease process or treatment will be avoided or minimized Outcome: Adequate for Discharge Goal: Dialysis access will remain free of complications Outcome: Adequate for Discharge   Problem: Activity: Goal: Activity intolerance will improve Outcome: Adequate for Discharge   Problem: Fluid Volume: Goal: Fluid volume balance will be maintained or improved Outcome: Adequate for Discharge   Problem: Nutritional: Goal: Ability to make appropriate dietary choices will improve Outcome: Adequate for Discharge   Problem: Respiratory: Goal: Respiratory symptoms related to disease process will be avoided Outcome: Adequate for Discharge   Problem: Self-Concept: Goal: Body image disturbance will be avoided or minimized Outcome: Adequate for Discharge   Problem: Urinary Elimination: Goal: Progression of disease will be identified and treated Outcome: Adequate for Discharge   Problem: Education: Goal: Knowledge of the prescribed therapeutic regimen will improve Outcome: Adequate for Discharge   Problem: Activity: Goal: Ability to implement measures to reduce  episodes of fatigue will improve Outcome: Adequate for Discharge   Problem: Bowel/Gastric: Goal: Will not experience complications related to bowel motility Outcome: Adequate for Discharge   Problem: Coping: Goal: Ability to identify and develop effective coping behavior will improve Outcome: Adequate for Discharge   Problem: Nutritional: Goal: Maintenance of adequate nutrition will improve Outcome: Adequate for Discharge

## 2020-10-05 NOTE — Discharge Summary (Signed)
Physician Discharge Summary  Tom Marshall ATF:[id number] DOB: 07/13/1951  PCP: Asencion Noble, MD  Admitted from: Home Discharged to: Home  Admit date: 09/28/2020 Discharge date: 10/05/2020  Recommendations for Outpatient Follow-up:    [facility] Follow up on 10/06/2020.   Why: Please report to your dialysis clinic for your first appointment on 10/06/20 at 11:30am to sign papers before your first treatment that day.  Your outpatient dialysis schedule is Tuesdays, Thursdays and Saturdays at 12:15pm. Contact information: [address] [place] [address] [phone]        Asencion Noble, MD. Schedule an appointment as soon as possible for a visit in 1 week(s).   Specialty: Internal Medicine Contact information: [address] [place] [address] [phone]        Nicholas Lose, MD. Schedule an appointment as soon as possible for a visit on 10/07/2020.   Specialty: Hematology and Oncology Why: MDs office will call you tomorrow 10/06/2020 with details regarding your appointment for 10/07/2020 Contact information: [address] [place] [address] [place]: None    Equipment/Devices: None    Discharge Condition: Improved and stable   Code Status: Full Code Diet recommendation:  Discharge Diet Orders (From admission, onward)    Start     Ordered   10/05/20 0000  Diet - low sodium heart healthy        10/05/20 1708   10/05/20 0000  Diet Carb Modified        10/05/20 1708           Discharge Diagnoses:  Active Problems:   AKI (acute kidney injury) [facility])   Brief Summary: 69 year old male with past medical history of renal cell carcinoma status post nephrectomy in 1998, currently with solitary kidney, stage IV CKD with baseline creatinine between 2.5-2.7, type II DM/IDDM, hypertension, hyperlipidemia, multiple myeloma, actively undergoing treatment at the [facility] cancer center with Dr. Delton Coombes, presented to the emergency department with acute dyspnea.  He was called by his oncologist to go to the emergency department because of worsening renal insufficiency and hyperkalemia.  In the ED, his blood work showed a potassium of 6.8, bicarbonate of 19, BUN 88, creatinine 9.29, BNP 325 and hemoglobin of 6.7.  Nephrology was consulted and temporary hemodialysis was initiated.  Hematology was consulted and plan continued outpatient chemotherapy.  Despite HD x3, his renal functions did not recover, thereby he is having a tunneled HD catheter placed by IR today prior to discharge but nephrology expects that his renal function will improve.  He will start outpatient HD from tomorrow 10/06/2020.   Assessment and plan:  Acute kidney injury complicating stage IV chronic kidney disease: Nephrology consulted and assisted with management.  Patient with solitary kidney s/p right nephrectomy due to [place] and ongoing treatment for multiple myeloma.  Serum creatinine jumped from baseline 2.78 on 11/22 up to 9.42 on 09/28/2020.  He had also been on lisinopril 5 mg daily prior to admission and did have some diarrhea as well as progressively increased lower extremity edema.  Nephrology initiated HD 09/30/2020 and he again underwent dialysis on 12/4 and 12/6.  He is not oliguric.  L IJ temporary HD catheter was placed by IR on 09/29/2020.  As per nephrology recommendations, the temporary HD catheter is being converted to a tunneled catheter by IR today prior to discharge.  He  has an outpatient dialysis appointment for tomorrow 08/06/2020 as an acute renal failure requiring RRT.  I discussed in detail with Dr. Augustin Coupe, nephrology.  He advised to reduce the allopurinol dose, gabapentin dose, continue prior home dose of Lasix, stop bicarbonate, resume prior home dose of Eliquis from tomorrow and cleared him for discharge home.  On my behalf, patient's RN also confirmed with IR team that patient  may be discharged this evening post their transitioning from temporary to tunneled HD catheter and can resume Eliquis from tomorrow  Hyperkalemia: Resolved after Lokelma, IV Lasix and RRT.  Follow BMP and CBC across HD as outpatient.  Anasarca/volume overload: Unclear if he has history of CHF but has low albumin and AKI likely contributing as well as myeloma.  Volume management across HD.  Dr. Augustin Coupe also recommends continuing prior home dose of Lasix.  Essential hypertension: Controlled.  Discontinued lisinopril due to acute renal failure.  Multiple myeloma Currently receiving chemotherapy at cancer center at [facility] and as per nephrology, okay to receive Velcade.  Oncology input from 10/04/2020 appreciated.  The plan for him is to receive Velcade and selinexor, which he was supposed to get at the [facility] center today but unfortunately his discharge was delayed.  Thereby I discussed with Dr. Lindi Adie who advised that his chemo appointment has been postponed to 10/07/2020.  He also recommended stopping the daratumumab, continuing prior chemo dose of Decadron and Salinexor.  He also recommended continuing acyclovir prophylaxis indefinitely.  Anemia: Acute on chronic.  Likely multifactorial related to multiple myeloma, chemotherapy, worsening renal insufficiency.  S/p blood transfusion x2 units of PRBCs on 10/01/2019.  Follow CBC as outpatient.  Type II DM/IDDM with renal complications: Discontinued oral hypoglycemics due to acute kidney injury and risk of hypoglycemia.  Patient CBGs below are only mildly uncontrolled despite lower than home dose of Lantus.  Thereby reduced [place] as noted above, continue prior home dose of short-acting insulins with close outpatient follow-up with PCP for further management.  Hyperlipidemia: Continue statins.  Thrombocytopenia: Likely related to myeloma and chemotherapy.  Outpatient follow-up.  Stable.  History of DVT Patient  reported DVT several years ago, has been on reduced dose of Eliquis 2.5 mg once daily.  This was held for procedure.  RN confirmed with IR that he may resume Eliquis from tomorrow.  Consultations:  Nephrology  Interventional radiology  Medical oncology  Procedures:  As noted above.   Discharge Instructions  Discharge Instructions    (HEART FAILURE PATIENTS) Call MD:  Anytime you have any of the following symptoms: 1) 3 pound weight gain in 24 hours or 5 pounds in 1 week 2) shortness of breath, with or without a dry hacking cough 3) swelling in the hands, feet or stomach 4) if you have to sleep on extra pillows at night in order to breathe.   Complete by: As directed    Call MD for:  difficulty breathing, headache or visual disturbances   Complete by: As directed    Call MD for:  extreme fatigue   Complete by: As directed    Call MD for:  persistant dizziness or light-headedness   Complete by: As directed    Call MD for:  persistant nausea and vomiting   Complete by: As directed    Call MD for:  redness, tenderness, or signs of infection (pain, swelling, redness, odor or green/yellow discharge around incision site)   Complete by: As directed    Call MD for:  severe uncontrolled pain   Complete by: As directed    Call MD for:  temperature >100.4   Complete by: As directed    Diet - low sodium heart healthy   Complete by: As directed    Diet Carb Modified   Complete by: As directed    Increase activity slowly   Complete by: As directed    No wound care   Complete by: As directed    TREATMENT CONDITIONS   Complete by: As directed    Patient should have CBC & CMP within 7 days prior to chemotherapy administration. NOTIFY MD IF: ANC < 1500, Hemoglobin < 8, PLT < 100,000,  Total Bili > 1.5, Creatinine > 1.5, ALT & AST > 80 or if patient has unstable vital signs: Temperature > 38.5, SBP > 180 or < 90, RR > 30 or HR > 100.       Medication List    STOP taking these  medications   DARATUMUMAB IV   glipiZIDE 10 MG 24 hr tablet Commonly known as: GLUCOTROL XL   lisinopril 5 MG tablet Commonly known as: ZESTRIL   sodium bicarbonate 650 MG tablet     TAKE these medications   acetaminophen 325 MG tablet Commonly known as: TYLENOL Take 650 mg by mouth every 6 (six) hours as needed.   acyclovir 200 MG capsule Commonly known as: ZOVIRAX Take 1 capsule (200 mg total) by mouth 2 (two) times daily.   alfuzosin 10 MG 24 hr tablet Commonly known as: UROXATRAL Take 1 tablet (10 mg total) by mouth daily with breakfast.   allopurinol 100 MG tablet Commonly known as: ZYLOPRIM Take 2 tablets (200 mg total) by mouth every morning. What changed:   medication strength  how much to take   apixaban 2.5 MG Tabs tablet Commonly known as: Eliquis Take 1 tablet (2.5 mg total) by mouth daily. Start taking from tomorrow 10/06/2020 What changed: See the new instructions.   B-D UF III MINI PEN NEEDLES 31G X 5 MM Misc Generic drug: Insulin Pen Needle   Centrum Silver 50+Men Tabs Take 1 tablet by mouth every morning.   cyclobenzaprine 10 MG tablet Commonly known as: FLEXERIL TAKE 1 TABLET THREE TIMES DAILY AS NEEDED FOR MUSCLE SPASM What changed: See the new instructions.   dexamethasone 4 MG tablet Commonly known as: DECADRON Take 5 tablets (20 mg total) by mouth once a week.   dicyclomine 10 MG capsule Commonly known as: BENTYL Take 10 mg by mouth 2 (two) times daily as needed.   diphenoxylate-atropine 2.5-0.025 MG tablet Commonly known as: LOMOTIL Take 2 tablets by mouth 4 (four) times daily as needed.   furosemide 40 MG tablet Commonly known as: LASIX Take 1.5 tablets (60 mg total) by mouth daily.   gabapentin 300 MG capsule Commonly known as: NEURONTIN Take 1 capsule (300 mg total) by mouth at bedtime. What changed: how much to take   HYDROcodone-acetaminophen 5-325 MG tablet Commonly known as: NORCO/VICODIN Take 1 tablet by mouth 2  (two) times daily as needed for severe pain.   NovoLOG FlexPen 100 UNIT/ML FlexPen Generic drug: insulin aspart Inject 8 Units into the skin 3 (three) times daily with meals.   pravastatin 20 MG tablet Commonly known as: PRAVACHOL Take 20 mg by mouth at bedtime.   traZODone 100 MG tablet Commonly known as: DESYREL Take 1 tablet (100 mg total) by mouth at bedtime.   Tyler Aas FlexTouch 200 UNIT/ML FlexTouch Pen Generic drug: insulin degludec Inject 16  Units into the skin daily. What changed: how much to take   True Metrix Blood Glucose Test test strip Generic drug: glucose blood   Xpovio 40 MG Tbpk Generic drug: selinexor Take 40 mg by mouth once a week.      No Known Allergies    Procedures/Studies: US RENAL  Result Date: 09/28/2020 CLINICAL DATA:  69 year old male with acute renal insufficiency. Prior right nephrectomy. EXAM: RENAL / URINARY TRACT ULTRASOUND COMPLETE COMPARISON:  None. FINDINGS: Right Kidney: Nephrectomy. Left Kidney: Renal measurements: 12.2 x 6.1 x 5.3 cm = volume: 207 mL. Normal echogenicity. No hydronephrosis or shadowing stone. There is an 8.3 x 6.8 x 8.8 cm left renal inferior pole cyst. Bladder: Appears normal for degree of bladder distention. Other: There is diffuse increased liver echogenicity most commonly seen in the setting of fatty infiltration. Superimposed inflammation or fibrosis is not excluded. Clinical correlation is recommended. IMPRESSION: 1. Prior right nephrectomy. 2. Large left renal inferior pole cyst. No hydronephrosis or shadowing stone. Electronically Signed   By: Anner Crete M.D.   On: 09/28/2020 23:48   IR Fluoro Guide CV Line Left  Result Date: 09/29/2020 INDICATION: Acute renal insufficiency. Please perform image guided placement of temporary dialysis catheter for the initiation dialysis Note, the decision was made to proceed with placement of a temporary dialysis catheter as there is hope the patient will only temporary  hemodialysis per discussion with the providing nephrology service. EXAM: NON-TUNNELED CENTRAL VENOUS HEMODIALYSIS CATHETER PLACEMENT WITH ULTRASOUND AND FLUOROSCOPIC GUIDANCE COMPARISON:  None. MEDICATIONS: None FLUOROSCOPY TIME:  36 seconds (10 mGy) COMPLICATIONS: None immediate. PROCEDURE: Informed written consent was obtained from the patient after a discussion of the risks, benefits, and alternatives to treatment. Questions regarding the procedure were encouraged and answered. Given the presence of the right internal jugular approach port a catheter decision was made to place a left internal jugular approach temporary dialysis catheter As such the left neck and chest were prepped with chlorhexidine in a sterile fashion, and a sterile drape was applied covering the operative field. Maximum barrier sterile technique with sterile gowns and gloves were used for the procedure. A timeout was performed prior to the initiation of the procedure. After the overlying soft tissues were anesthetized, a small venotomy incision was created and a micropuncture kit was utilized to access the internal jugular vein. Real-time ultrasound guidance was utilized for vascular access including the acquisition of a permanent ultrasound image documenting patency of the accessed vessel. The microwire was utilized to measure appropriate catheter length. A stiff glidewire was advanced to the level of the IVC. Under fluoroscopic guidance, the venotomy was serially dilated, ultimately allowing placement of a 20 cm temporary [place] catheter with tip ultimately terminating within the superior aspect of the right atrium. Final catheter positioning was confirmed and documented with a spot radiographic image. The catheter aspirates and flushes normally. The catheter was flushed with appropriate volume heparin dwells. The catheter exit site was secured with a 0-Prolene retention suture. A dressing was placed. The patient tolerated the procedure  well without immediate post procedural complication. IMPRESSION: Successful placement of a left internal jugular approach 20 cm temporary dialysis catheter with tip terminating with in the superior aspect of the right atrium. The catheter is ready for immediate use. PLAN: This catheter may be converted to a tunneled dialysis catheter at a later date as indicated. Electronically Signed   By: Sandi Mariscal M.D.   On: 09/29/2020 15:47   IR US Guide Vasc Access Left  Result Date: 09/29/2020 INDICATION: Acute renal insufficiency. Please perform image guided placement of temporary dialysis catheter for the initiation dialysis Note, the decision was made to proceed with placement of a temporary dialysis catheter as there is hope the patient will only temporary hemodialysis per discussion with the providing nephrology service. EXAM: NON-TUNNELED CENTRAL VENOUS HEMODIALYSIS CATHETER PLACEMENT WITH ULTRASOUND AND FLUOROSCOPIC GUIDANCE COMPARISON:  None. MEDICATIONS: None FLUOROSCOPY TIME:  36 seconds (10 mGy) COMPLICATIONS: None immediate. PROCEDURE: Informed written consent was obtained from the patient after a discussion of the risks, benefits, and alternatives to treatment. Questions regarding the procedure were encouraged and answered. Given the presence of the right internal jugular approach port a catheter decision was made to place a left internal jugular approach temporary dialysis catheter As such the left neck and chest were prepped with chlorhexidine in a sterile fashion, and a sterile drape was applied covering the operative field. Maximum barrier sterile technique with sterile gowns and gloves were used for the procedure. A timeout was performed prior to the initiation of the procedure. After the overlying soft tissues were anesthetized, a small venotomy incision was created and a micropuncture kit was utilized to access the internal jugular vein. Real-time ultrasound guidance was utilized for vascular access  including the acquisition of a permanent ultrasound image documenting patency of the accessed vessel. The microwire was utilized to measure appropriate catheter length. A stiff glidewire was advanced to the level of the IVC. Under fluoroscopic guidance, the venotomy was serially dilated, ultimately allowing placement of a 20 cm temporary [place] catheter with tip ultimately terminating within the superior aspect of the right atrium. Final catheter positioning was confirmed and documented with a spot radiographic image. The catheter aspirates and flushes normally. The catheter was flushed with appropriate volume heparin dwells. The catheter exit site was secured with a 0-Prolene retention suture. A dressing was placed. The patient tolerated the procedure well without immediate post procedural complication. IMPRESSION: Successful placement of a left internal jugular approach 20 cm temporary dialysis catheter with tip terminating with in the superior aspect of the right atrium. The catheter is ready for immediate use. PLAN: This catheter may be converted to a tunneled dialysis catheter at a later date as indicated. Electronically Signed   By: Sandi Mariscal M.D.   On: 09/29/2020 15:47   DG Chest Port 1 View  Result Date: 09/28/2020 CLINICAL DATA:  69 year old male with shortness of breath. EXAM: PORTABLE CHEST 1 VIEW COMPARISON:  Chest radiograph dated 05/18/2020. FINDINGS: Right-sided Port-A-Cath in similar position. There is diffuse interstitial coarsening and mild chronic bronchitic changes. There are bibasilar atelectasis/scarring. No focal consolidation, pleural effusion, pneumothorax. Stable cardiac silhouette. No acute osseous pathology. Old rib fractures. IMPRESSION: No acute cardiopulmonary process. Electronically Signed   By: Anner Crete M.D.   On: 09/28/2020 18:30      Subjective: Patient seen this morning prior to procedures.  Denied complaints.  Anxious to be discharged.  Denies pain, dyspnea,  dizziness or lightheadedness.  As per RN, no acute issues noted.  Discharge Exam:  Vitals:   10/05/20 1715 10/05/20 1720 10/05/20 1725 10/05/20 1730  BP: (!) 143/68 140/70 139/64 (!) 146/66  Pulse: 71 72 68 75  Resp: '13 13 15 15  ' Temp:      TempSrc:      SpO2: 100% 99% 99% 99%  Weight:      Height:        General: Pt lying comfortably in bed & appears in no obvious distress.  Moderately built and obese. Cardiovascular: S1 & S2 heard, RRR, S1/S2 +. No murmurs, rubs, gallops or clicks. No JVD or pedal edema.  Telemetry personally reviewed: Sinus rhythm. Respiratory: Clear to auscultation without wheezing, rhonchi or crackles. No increased work of breathing.  Left upper anterior chest temporary HD catheter noted to Abdominal:  Non distended, non tender & soft. No organomegaly or masses appreciated. Normal bowel sounds heard. CNS: Alert and oriented. No focal deficits. Extremities: no edema, no cyanosis    The results of significant diagnostics from this hospitalization (including imaging, microbiology, ancillary and laboratory) are listed below for reference.     Microbiology: Recent Results (from the past 240 hour(s))  Resp Panel by RT-PCR (Flu A&B, Covid) Nasopharyngeal Swab     Status: None   Collection Time: 09/28/20  6:35 PM   Specimen: Nasopharyngeal Swab; Nasopharyngeal(NP) swabs in vial transport medium  Result Value Ref Range Status   SARS Coronavirus 2 by RT PCR NEGATIVE NEGATIVE Final    Comment: (NOTE) SARS-CoV-2 target nucleic acids are NOT DETECTED.  The SARS-CoV-2 RNA is generally detectable in upper respiratory specimens during the acute phase of infection. The lowest concentration of SARS-CoV-2 viral copies this assay can detect is 138 copies/mL. A negative result does not preclude SARS-Cov-2 infection and should not be used as the sole basis for treatment or other patient management decisions. A negative result may occur with  improper specimen  collection/handling, submission of specimen other than nasopharyngeal swab, presence of viral mutation(s) within the areas targeted by this assay, and inadequate number of viral copies(<138 copies/mL). A negative result must be combined with clinical observations, patient history, and epidemiological information. The expected result is Negative.  Fact Sheet for Patients:  EntrepreneurPulse.com.au  Fact Sheet for Healthcare Providers:  IncredibleEmployment.be  This test is no t yet approved or cleared by the [place] FDA and  has been authorized for detection and/or diagnosis of SARS-CoV-2 by FDA under an Emergency Use Authorization (EUA). This EUA will remain  in effect (meaning this test can be used) for the duration of the COVID-19 declaration under Section 564(b)(1) of the Act, 21 U.S.C.section 360bbb-3(b)(1), unless the authorization is terminated  or revoked sooner.       Influenza A by PCR NEGATIVE NEGATIVE Final   Influenza B by PCR NEGATIVE NEGATIVE Final    Comment: (NOTE) The Xpert Xpress SARS-CoV-2/FLU/RSV plus assay is intended as an aid in the diagnosis of influenza from Nasopharyngeal swab specimens and should not be used as a sole basis for treatment. Nasal washings and aspirates are unacceptable for Xpert Xpress SARS-CoV-2/FLU/RSV testing.  Fact Sheet for Patients: EntrepreneurPulse.com.au  Fact Sheet for Healthcare Providers: IncredibleEmployment.be  This test is not yet approved or cleared by the [place] FDA and has been authorized for detection and/or diagnosis of SARS-CoV-2 by FDA under an Emergency Use Authorization (EUA). This EUA will remain in effect (meaning this test can be used) for the duration of the COVID-19 declaration under Section 564(b)(1) of the Act, 21 U.S.C. section 360bbb-3(b)(1), unless the authorization is terminated or revoked.  Performed at [facility], [address] [place], [place] [address]      Labs: CBC: Recent Labs  Lab 09/28/20 1824 09/28/20 1824 09/29/20 0500 09/29/20 1540 09/30/20 0530 09/30/20 0530 10/01/20 0531 10/02/20 0407 10/03/20 0444 10/04/20 0830 10/05/20 0423  WBC 4.3  --  3.3*  --  3.9*  --   --  4.9  --   --   --  HGB 6.7*   < > 6.1*   < > 7.5*   < > 8.1* 8.8* 8.8* 8.9* 9.4*  HCT 20.5*   < > 18.2*   < > 22.1*   < > 23.7* 26.5* 26.8* 26.9* 28.7*  MCV 104.1*  --  101.1*  --  96.5  --   --  95.0  --   --   --   PLT 110*  --  104*  --  106*  --   --  149*  --   --   --    < > = values in this interval not displayed.    Basic Metabolic Panel: Recent Labs  Lab 10/01/20 0531 10/02/20 0407 10/03/20 0444 10/04/20 0830 10/05/20 0423  NA 138 136 138 139 139  K 4.4 4.8 4.2 3.8 3.5  CL 99 96* 96* 99 98  CO2 '24 26 26 24 24  ' GLUCOSE 74 251* 115* 127* 134*  BUN 61* 47* 71* 37* 48*  CREATININE 7.45* 5.73* 7.41* 5.33* 6.89*  CALCIUM 8.2* 8.8* 8.8* 9.0 8.9  MG 1.7 1.9 1.9 1.9 1.9  PHOS 6.9* 5.6* 7.4*  7.3* 6.6* 8.5*    Liver Function Tests: Recent Labs  Lab 09/28/20 1824 09/28/20 1824 09/29/20 0500 09/29/20 1540 09/30/20 0530 09/30/20 0530 10/01/20 0531 10/02/20 0407 10/03/20 0444 10/04/20 0830 10/05/20 0423  AST 13*  --  9*  --  14*  --   --   --   --   --   --   ALT 29  --  26  --  28  --   --   --   --   --   --   ALKPHOS 61  --  57  --  50  --   --   --   --   --   --   BILITOT 0.5  --  0.5  --  0.8  --   --   --   --   --   --   PROT 5.7*  --  5.2*  --  4.9*  --   --   --   --   --   --   ALBUMIN 3.3*   < > 2.9*   < > 2.9*  2.8*   < > 2.8* 3.3* 3.3* 3.3* 3.4*   < > = values in this interval not displayed.    CBG: Recent Labs  Lab 10/04/20 1133 10/04/20 1636 10/04/20 2150 10/05/20 0749 10/05/20 1144  GLUCAP 119* 204* 185* 127* 124*    Urinalysis    Component Value Date/Time   COLORURINE STRAW (A) 09/28/2020 2109   APPEARANCEUR CLEAR 09/28/2020 2109   APPEARANCEUR  Clear 07/12/2020 0938   LABSPEC 1.011 09/28/2020 2109   PHURINE 7.0 09/28/2020 2109   GLUCOSEU 150 (A) 09/28/2020 2109   HGBUR SMALL (A) 09/28/2020 2109   BILIRUBINUR NEGATIVE 09/28/2020 2109   BILIRUBINUR Negative 07/12/2020 [place] 09/28/2020 2109   PROTEINUR 30 (A) 09/28/2020 2109   NITRITE NEGATIVE 09/28/2020 2109   LEUKOCYTESUR NEGATIVE 09/28/2020 2109      Time coordinating discharge: 45 minutes  SIGNED:  Vernell Leep, MD, FACP, [facility]. [facility]  To contact the attending provider between 7A-7P or the covering provider during after hours 7P-7A, please log into the web site [email or web address] and access using universal [place] password for that web site. If you do not have the password, please call the hospital operator.

## 2020-10-05 NOTE — Progress Notes (Signed)
[facility] Progress Note     Assessment/ Plan:   1. 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 Scr jumped from baseline of 2.78 on 11/22 up to 9.42 on 09/28/20. He had been on lisinopril 5 mg daily prior to admission and did have some diarrhea as well as progressive increased lower extremity edema.  1. InitiatedHD12/3/21 and again on 10/01/20. Last HD 12/6 with net UF 1.5L. 2. Non oliguric. 3. No e/o recovery yet; cr still rising 2 days after RRT.  4. Fortunately he was willing to stay for VIR conversion to TC and outpt dialysis as an acute req RRT. Next HD will be @ [facility] 2nd shift.  5. He already has a spot a [place] with Dr Geanie Kenning. TTS with next HD on Thursday. 6. LIJ temp HD catheter placed by IR on 09/29/20 and to be converted today. 2. Hyperkalemia- improved with lokelma and IV lasix -> RRT 3. Anasarca/volume overload- unclear if he has a history of CHF but low albumin and AKI likely contributing as well as myeloma.  4. HTN 5. Multiple myeloma- currently receiving chemo at the cancer center at [facility] ok to receive velcade per Pharmacy/Hematology. 6. Anemia- acute on chronic - s/p blood transfusion12/3/21. Continue to follow 7. Disposition- He had been following Dr. Theador Hawthorne but would like to transfer to our [place] office for outpatient follow up of his CKD stage IV  Subjective:   Feels well and denies dyspnea, fever, chills, nausea. Good appetite.  No events overnight.    Objective:   BP 123/68 (BP Location: Left Arm)   Pulse 92   Temp 98.6 F (37 C) (Oral)   Resp 20   Ht 5' 7.99" (1.727 m)   Wt 97.1 kg   SpO2 98%   BMI 32.56 kg/m   Intake/Output Summary (Last 24 hours) at 10/05/2020 0730 Last data filed at 10/04/2020 2300 Gross per 24 hour  Intake --  Output 650 ml  Net -650 ml   Weight change: -4.585 kg  Physical Exam: Gen:NAD CVS:RRR, no  rub Resp:cta SPQ:ZRAQT, +BS, soft, NT/ND MAU:QJFHL pretibial edema Access: LIJ temp  Imaging: No results found.  Labs: BMET Recent Labs  Lab 09/29/20 1540 09/30/20 0530 10/01/20 0531 10/02/20 0407 10/03/20 0444 10/04/20 0830 10/05/20 0423  NA 137 138 138 136 138 139 139  K 5.5* 5.6* 4.4 4.8 4.2 3.8 3.5  CL 100 100 99 96* 96* 99 98  CO2 21* '23 24 26 26 24 24  ' GLUCOSE 172* 87 74 251* 115* 127* 134*  BUN 87* 99* 61* 47* 71* 37* 48*  CREATININE 9.59* 10.30* 7.45* 5.73* 7.41* 5.33* 6.89*  CALCIUM 8.3* 8.0* 8.2* 8.8* 8.8* 9.0 8.9  PHOS 7.3* 8.4* 6.9* 5.6* 7.4*  7.3* 6.6* 8.5*   CBC Recent Labs  Lab 09/28/20 1458 09/28/20 1458 09/28/20 1824 09/28/20 1824 09/29/20 0500 09/29/20 1540 09/30/20 0530 10/01/20 0531 10/02/20 0407 10/03/20 0444 10/04/20 0830 10/05/20 0423  WBC 3.5*   < > 4.3  --  3.3*  --  3.9*  --  4.9  --   --   --   NEUTROABS 3.0  --   --   --   --   --   --   --   --   --   --   --   HGB 7.1*   < > 6.7*   < > 6.1*   < >  7.5*   < > 8.8* 8.8* 8.9* 9.4*  HCT 22.1*   < > 20.5*   < > 18.2*   < > 22.1*   < > 26.5* 26.8* 26.9* 28.7*  MCV 105.7*   < > 104.1*  --  101.1*  --  96.5  --  95.0  --   --   --   PLT 104*   < > 110*  --  104*  --  106*  --  149*  --   --   --    < > = values in this interval not displayed.    Medications:    . sodium chloride   Intravenous Once  . acyclovir  200 mg Oral BID  . alfuzosin  10 mg Oral Q breakfast  . Chlorhexidine Gluconate Cloth  6 each Topical Daily  . furosemide  80 mg Intravenous Q6H  . heparin injection (subcutaneous)  5,000 Units Subcutaneous Q8H  . insulin aspart  0-9 Units Subcutaneous TID WC  . insulin detemir  10 Units Subcutaneous QHS  . pravastatin  20 mg Oral QHS  . sodium bicarbonate  1,300 mg Oral BID  . sodium chloride flush  10-40 mL Intracatheter Q12H  . traZODone  100 mg Oral QHS      Otelia Santee, MD 10/05/2020, 7:30 AM

## 2020-10-06 DIAGNOSIS — N178 Other acute kidney failure: Secondary | ICD-10-CM | POA: Diagnosis not present

## 2020-10-06 DIAGNOSIS — Z992 Dependence on renal dialysis: Secondary | ICD-10-CM | POA: Diagnosis not present

## 2020-10-06 DIAGNOSIS — D509 Iron deficiency anemia, unspecified: Secondary | ICD-10-CM | POA: Diagnosis not present

## 2020-10-06 DIAGNOSIS — E119 Type 2 diabetes mellitus without complications: Secondary | ICD-10-CM | POA: Diagnosis not present

## 2020-10-06 DIAGNOSIS — N179 Acute kidney failure, unspecified: Secondary | ICD-10-CM | POA: Diagnosis not present

## 2020-10-06 DIAGNOSIS — N2581 Secondary hyperparathyroidism of renal origin: Secondary | ICD-10-CM | POA: Diagnosis not present

## 2020-10-06 DIAGNOSIS — D649 Anemia, unspecified: Secondary | ICD-10-CM | POA: Diagnosis not present

## 2020-10-06 NOTE — Progress Notes (Signed)
[place], [place] [address]   CLINIC:  Medical Oncology/Hematology  PCP:  Asencion Noble, MD [address] / [place] [address] [phone]   REASON FOR VISIT:  Follow-up for multiple myeloma  PRIOR THERAPY:  1. Revlimid x 4 cycles from 09/05/2017 to 02/18/2018 with Revlimid maintenance. 2. Pomalyst from 06/20/2020 to 09/19/2020. 3. Darzalex x 6 cycles from 04/11/2020 to 09/05/2020. 4. Now on selenixir, bortezomib and dexamethasone.  NGS Results: Not done  CURRENT THERAPY: Velcade and selinexor to begin  BRIEF ONCOLOGIC HISTORY:  Oncology History  Multiple myeloma ([place])  08/29/2017 Initial Diagnosis   Multiple myeloma ([place])   12/10/2017 - 03/11/2018 Chemotherapy   The patient had dexamethasone (DECADRON) 4 MG tablet, 1 of 1 cycle, Start date: --, End date: -- lenalidomide (REVLIMID) 25 MG capsule, 1 of 1 cycle, Start date: --, End date: -- bortezomib SQ (VELCADE) chemo injection 2.75 mg, 1.3 mg/m2 = 2.75 mg, Subcutaneous,  Once, 5 of 5 cycles Administration: 2.75 mg (12/10/2017), 2.75 mg (12/17/2017), 2.75 mg (12/31/2017), 2.75 mg (01/21/2018), 2.75 mg (02/18/2018), 2.75 mg (03/11/2018)  for chemotherapy treatment.    04/11/2020 - 09/05/2020 Chemotherapy   The patient had daratumumab (DARZALEX) 800 mg in sodium chloride 0.9 % 960 mL (0.8 mg/mL) chemo infusion, 8 mg/kg = 800 mg (100 % of original dose 8 mg/kg), Intravenous, Once, 1 of 1 cycle Dose modification: 8 mg/kg (original dose 8 mg/kg, Cycle 1, Reason: Provider Judgment, Comment: giving total dose 17m/kg over 2 days) daratumumab (DARZALEX) 800 mg in sodium chloride 0.9 % 460 mL (1.6 mg/mL) chemo infusion, 8 mg/kg = 800 mg (50 % of original dose 16 mg/kg), Intravenous, Once, 2 of 2 cycles Dose modification: 8 mg/kg (original dose 16 mg/kg, Cycle 1, Reason: Provider Judgment, Comment: giving 16 mg/kg over 2 days. thus 8 mg/kg in 2 divided doses.) Administration: 1,600 mg  (04/19/2020), 1,600 mg (05/03/2020), 1,600 mg (05/09/2020), 800 mg (04/11/2020), 800 mg (04/12/2020) daratumumab (DARZALEX) 1,600 mg in sodium chloride 0.9 % 420 mL chemo infusion, 16 mg/kg = 1,600 mg, Intravenous, Once, 5 of 6 cycles Administration: 1,600 mg (05/16/2020), 1,600 mg (05/23/2020), 1,600 mg (05/30/2020), 1,600 mg (06/06/2020), 1,600 mg (06/13/2020), 1,600 mg (06/27/2020), 1,600 mg (07/11/2020), 1,600 mg (07/25/2020), 1,600 mg (08/08/2020), 1,600 mg (08/22/2020), 1,600 mg (09/05/2020)  for chemotherapy treatment.    09/30/2020 -  Chemotherapy   The patient had dexamethasone (DECADRON) tablet 20 mg, 20 mg, Oral,  Once, 1 of 4 cycles Administration: 20 mg (10/01/2020) bortezomib SQ (VELCADE) chemo injection (2.548mmL concentration) 3 mg, 1.3 mg/m2 = 3 mg, Subcutaneous,  Once, 1 of 4 cycles Administration: 3 mg (10/01/2020)  for chemotherapy treatment.      CANCER STAGING: Cancer Staging No matching staging information was found for the patient.  INTERVAL HISTORY:  Mr. WiCHARAN PRIETOa [age].o. male, returns for routine follow-up and consideration for next cycle of treatment.  He is now scheduled to be on dialysis, Tuesday, Thursday and Saturday. He complains of loss of appetite and fatigue, otherwise doing well. No pain, No SOB. Urinating a little. Diarrhea, mild less than 5 bowel movements a day. No abdominal pain Neuropathy minimal in hands and feet.   REVIEW OF SYSTEMS:  Review of Systems  Constitutional: Positive for appetite change (50%) and fatigue (50%).  Respiratory: Negative for shortness of breath.   Gastrointestinal: Positive for diarrhea. Negative for abdominal pain.  Musculoskeletal: Positive for flank pain.  Neurological: Positive for numbness (& tingling  in fingertips).  All other systems reviewed and are negative.   PAST MEDICAL/SURGICAL HISTORY:  Past Medical History:  Diagnosis Date  . Cancer ([facility])    multiple myeloma  . Cancer of right kidney ([facility])   .  Cervical dystonia   . Diabetes mellitus without complication ([facility])   . Gout   . Hypercholesteremia    Past Surgical History:  Procedure Laterality Date  . BONE MARROW BIOPSY    . CHOLECYSTECTOMY  2007  . COLONOSCOPY WITH PROPOFOL N/A 01/16/2018   Procedure: COLONOSCOPY WITH PROPOFOL;  Surgeon: Daneil Dolin, MD;  Location: [facility];  Service: Endoscopy;  Laterality: N/A;  1:45pm  . ESOPHAGOGASTRODUODENOSCOPY (EGD) WITH PROPOFOL N/A 01/16/2018   Procedure: ESOPHAGOGASTRODUODENOSCOPY (EGD) WITH PROPOFOL;  Surgeon: Daneil Dolin, MD;  Location: [facility];  Service: Endoscopy;  Laterality: N/A;  . GIVENS CAPSULE STUDY N/A 05/12/2018   Procedure: GIVENS CAPSULE STUDY;  Surgeon: Daneil Dolin, MD;  Location: [facility];  Service: Endoscopy;  Laterality: N/A;  7:30am  . IR FLUORO GUIDE CV LINE LEFT  09/29/2020  . IR FLUORO GUIDE CV LINE LEFT  10/05/2020  . IR US GUIDE VASC ACCESS LEFT  09/29/2020  . IR US GUIDE VASC ACCESS LEFT  10/05/2020  . NEPHRECTOMY Right 1998   cancer  . PORTACATH PLACEMENT Right 04/07/2020   Procedure: INSERTION PORT-A-CATH (attached catherter in right internal jugular);  Surgeon: Virl Cagey, MD;  Location: [facility];  Service: General;  Laterality: Right;    SOCIAL HISTORY:  Social History   Socioeconomic History  . Marital status: Single    Spouse name: Not on file  . Number of children: Not on file  . Years of education: Not on file  . Highest education level: Not on file  Occupational History  . Occupation: Marine scientist, Insurance underwriter  Tobacco Use  . Smoking status: Never Smoker  . Smokeless tobacco: Former Systems developer    Types: Secondary school teacher  . Vaping Use: Never used  Substance and Sexual Activity  . Alcohol use: No  . Drug use: No  . Sexual activity: Not Currently  Other Topics Concern  . Not on file  Social History Narrative  . Not on file   Social Determinants of Health   Financial Resource Strain: Low Risk   . Difficulty of  Paying Living Expenses: Not hard at all  Food Insecurity: No Food Insecurity  . Worried About Charity fundraiser in the Last Year: Never true  . Ran Out of Food in the Last Year: Never true  Transportation Needs: No Transportation Needs  . Lack of Transportation (Medical): No  . Lack of Transportation (Non-Medical): No  Physical Activity: Inactive  . Days of Exercise per Week: 0 days  . Minutes of Exercise per Session: 0 min  Stress: No Stress Concern Present  . Feeling of Stress : Not at all  Social Connections: Moderately Integrated  . Frequency of Communication with Friends and Family: More than three times a week  . Frequency of Social Gatherings with Friends and Family: More than three times a week  . Attends Religious Services: More than 4 times per year  . Active Member of Clubs or Organizations: Yes  . Attends Archivist Meetings: More than 4 times per year  . Marital Status: Never married  Intimate Partner Violence: Not At Risk  . Fear of Current or Ex-Partner: No  . Emotionally Abused: No  . Physically Abused: No  .  Sexually Abused: No    FAMILY HISTORY:  Family History  Problem Relation Age of Onset  . Heart failure Mother 39  . Dementia Father   . Colon cancer Neg Hx     CURRENT MEDICATIONS:  Current Outpatient Medications  Medication Sig Dispense Refill  . acetaminophen (TYLENOL) 325 MG tablet Take 650 mg by mouth every 6 (six) hours as needed.    [name] acyclovir (ZOVIRAX) 200 MG capsule Take 1 capsule (200 mg total) by mouth 2 (two) times daily. 60 capsule 0  . alfuzosin (UROXATRAL) 10 MG 24 hr tablet Take 1 tablet (10 mg total) by mouth daily with breakfast. 30 tablet 11  . allopurinol (ZYLOPRIM) 100 MG tablet Take 2 tablets (200 mg total) by mouth every morning. 60 tablet 0  . apixaban (ELIQUIS) 2.5 MG TABS tablet Take 1 tablet (2.5 mg total) by mouth daily. Start taking from tomorrow 10/06/2020    . B-D UF III MINI PEN NEEDLES 31G X 5 MM MISC   4  .  cyclobenzaprine (FLEXERIL) 10 MG tablet TAKE 1 TABLET THREE TIMES DAILY AS NEEDED FOR MUSCLE SPASM (Patient taking differently: Take 10 mg by mouth 3 (three) times daily as needed. ) 30 tablet 0  . dexamethasone (DECADRON) 4 MG tablet Take 5 tablets (20 mg total) by mouth once a week. 20 tablet 3  . dicyclomine (BENTYL) 10 MG capsule Take 10 mg by mouth 2 (two) times daily as needed.     . diphenoxylate-atropine (LOMOTIL) 2.5-0.025 MG tablet Take 2 tablets by mouth 4 (four) times daily as needed.     . furosemide (LASIX) 40 MG tablet Take 1.5 tablets (60 mg total) by mouth daily. 45 tablet 1  . gabapentin (NEURONTIN) 300 MG capsule Take 1 capsule (300 mg total) by mouth at bedtime.    [name] HYDROcodone-acetaminophen (NORCO/VICODIN) 5-325 MG tablet Take 1 tablet by mouth 2 (two) times daily as needed for severe pain. 60 tablet 0  . Multiple Vitamins-Minerals (CENTRUM SILVER 50+MEN) TABS Take 1 tablet by mouth every morning.    [name] NOVOLOG FLEXPEN 100 UNIT/ML FlexPen Inject 8 Units into the skin 3 (three) times daily with meals.     . pravastatin (PRAVACHOL) 20 MG tablet Take 20 mg by mouth at bedtime.     . selinexor (XPOVIO) 40 MG TBPK Take 40 mg by mouth once a week. 4 each 0  . traZODone (DESYREL) 100 MG tablet Take 1 tablet (100 mg total) by mouth at bedtime. 30 tablet 3  . TRESIBA FLEXTOUCH 200 UNIT/ML FlexTouch Pen Inject 16 Units into the skin daily.    . TRUE METRIX BLOOD GLUCOSE TEST test strip      No current facility-administered medications for this visit.   Facility-Administered Medications Ordered in Other Visits  Medication Dose Route Frequency Provider Last Rate Last Admin  . 0.9 %  sodium chloride infusion  250 mL Intravenous Once Twana First, MD        ALLERGIES:  No Known Allergies  PHYSICAL EXAM:  Performance status (ECOG): 1 - Symptomatic but completely ambulatory  There were no vitals filed for this visit. Wt Readings from Last 3 Encounters:  10/05/20 214 lb 1.6 oz (97.1  kg)  09/19/20 235 lb 3.7 oz (106.7 kg)  09/05/20 233 lb 6.4 oz (105.9 kg)   Physical Exam Vitals reviewed.  Constitutional:      Appearance: Normal appearance. He is obese.  Neck:     Comments: quinton in place,  Chest port on  the right Cardiovascular:     Rate and Rhythm: Normal rate and regular rhythm.     Pulses: Normal pulses.     Heart sounds: Normal heart sounds.  Pulmonary:     Effort: Pulmonary effort is normal.     Breath sounds: Normal breath sounds.  Abdominal:     Palpations: Abdomen is soft.     Tenderness: There is no abdominal tenderness.  Musculoskeletal:     Thoracic back: No tenderness or bony tenderness.     Right lower leg: No edema.     Left lower leg: No edema.  Neurological:     General: No focal deficit present.     Mental Status: He is alert and oriented to person, place, and time.  Psychiatric:        Mood and Affect: Mood normal.        Behavior: Behavior normal.     LABORATORY DATA:  I have reviewed the labs as listed.  CBC Latest Ref Rng & Units 10/05/2020 10/04/2020 10/03/2020  WBC 4.0 - 10.5 K/uL - - -  Hemoglobin 13.0 - 17.0 g/dL 9.4(L) 8.9(L) 8.8(L)  Hematocrit 39.0 - 52.0 % 28.7(L) 26.9(L) 26.8(L)  Platelets 150 - 400 K/uL - - -   CMP Latest Ref Rng & Units 10/05/2020 10/04/2020 10/03/2020  Glucose 70 - 99 mg/dL 134(H) 127(H) 115(H)  BUN 8 - 23 mg/dL 48(H) 37(H) 71(H)  Creatinine 0.61 - 1.24 mg/dL 6.89(H) 5.33(H) 7.41(H)  Sodium 135 - 145 mmol/L 139 139 138  Potassium 3.5 - 5.1 mmol/L 3.5 3.8 4.2  Chloride 98 - 111 mmol/L 98 99 96(L)  CO2 22 - 32 mmol/L '24 24 26  ' Calcium 8.9 - 10.3 mg/dL 8.9 9.0 8.8(L)  Total Protein 6.5 - 8.1 g/dL - - -  Total Bilirubin 0.3 - 1.2 mg/dL - - -  Alkaline Phos 38 - 126 U/L - - -  AST 15 - 41 U/L - - -  ALT 0 - 44 U/L - - -   Lab Results  Component Value Date   LDH 132 09/28/2020   LDH 99 09/19/2020   LDH 104 09/05/2020   Lab Results  Component Value Date   TOTALPROTELP 5.5 (L) 09/28/2020    ALBUMINELP 3.1 09/28/2020   A1GS 0.2 09/28/2020   A2GS 0.8 09/28/2020   BETS 0.8 09/28/2020   GAMS 0.5 09/28/2020   MSPIKE 0.2 (H) 09/28/2020   SPEI Comment 09/28/2020    Lab Results  Component Value Date   KPAFRELGTCHN 5,360.7 (H) 09/28/2020   LAMBDASER 7.8 09/28/2020   KAPLAMBRATIO 687.27 (H) 09/28/2020    DIAGNOSTIC IMAGING:  I have independently reviewed the scans and discussed with the patient. US RENAL  Result Date: 09/28/2020 CLINICAL DATA:  69 year old male with acute renal insufficiency. Prior right nephrectomy. EXAM: RENAL / URINARY TRACT ULTRASOUND COMPLETE COMPARISON:  None. FINDINGS: Right Kidney: Nephrectomy. Left Kidney: Renal measurements: 12.2 x 6.1 x 5.3 cm = volume: 207 mL. Normal echogenicity. No hydronephrosis or shadowing stone. There is an 8.3 x 6.8 x 8.8 cm left renal inferior pole cyst. Bladder: Appears normal for degree of bladder distention. Other: There is diffuse increased liver echogenicity most commonly seen in the setting of fatty infiltration. Superimposed inflammation or fibrosis is not excluded. Clinical correlation is recommended. IMPRESSION: 1. Prior right nephrectomy. 2. Large left renal inferior pole cyst. No hydronephrosis or shadowing stone. Electronically Signed   By: Anner Crete M.D.   On: 09/28/2020 23:48   IR Fluoro Guide CV  Line Left  Result Date: 10/06/2020 CLINICAL DATA:  Renal failure requiring hemodialysis, history of multiple myeloma and need for tunneled hemodialysis catheter. The patient currently has a non tunneled temporary dialysis catheter via the left internal jugular vein which was placed on 09/29/2020. He has an indwelling right jugular Port-A-Cath. EXAM: TUNNELED CENTRAL VENOUS HEMODIALYSIS CATHETER PLACEMENT WITH ULTRASOUND AND FLUOROSCOPIC GUIDANCE ANESTHESIA/SEDATION: 0.5 mg IV Versed; 25 mcg IV Fentanyl. Total Moderate Sedation Time:   30 minutes. The patient's level of consciousness and physiologic status were continuously  monitored during the procedure by Radiology nursing. MEDICATIONS: 2 g IV Ancef. FLUOROSCOPY TIME:  30 seconds. PROCEDURE: The procedure, risks, benefits, and alternatives were explained to the patient. Questions regarding the procedure were encouraged and answered. The patient understands and consents to the procedure. A timeout was performed prior to initiating the procedure. The indwelling left jugular non tunneled hemodialysis catheter was removed and manual compression applied over the exit site. The left neck and chest were then prepped with chlorhexidine in a sterile fashion, and a sterile drape was applied covering the operative field. Maximum barrier sterile technique with sterile gowns and gloves were used for the procedure. Local anesthesia was provided with 1% lidocaine. Patency of the left internal jugular vein was confirmed by ultrasound. After creating a small venotomy incision, a 21 gauge needle was advanced into the left internal jugular vein under direct, real-time ultrasound guidance. Ultrasound image documentation was performed. After securing guidewire access, an 8 Fr dilator was placed. A J-wire was kinked to measure appropriate catheter length. A Palindrome tunneled hemodialysis catheter measuring 23 cm from tip to cuff was chosen for placement. This was tunneled in a retrograde fashion from the chest wall to the venotomy incision. At the venotomy, serial dilatation was performed and a 15 Fr peel-away sheath was placed over a guidewire. The catheter was then placed through the sheath and the sheath removed. Final catheter positioning was confirmed and documented with a fluoroscopic spot image. The catheter was aspirated, flushed with saline, and injected with appropriate volume heparin dwells. The venotomy incision was closed with subcuticular 4-0 Vicryl. Dermabond was applied to the incision. The catheter exit site was secured with 0-Prolene retention sutures. COMPLICATIONS: None.  No  pneumothorax. FINDINGS: After catheter placement, the tip lies in the right atrium. The catheter aspirates normally and is ready for immediate use. IMPRESSION: Placement of tunneled hemodialysis catheter via new access of the left internal jugular vein. The catheter tip lies in the right atrium. The catheter is ready for immediate use. Prior to tunnel catheter placement the non tunneled temporary left IJ catheter was removed. Electronically Signed   By: Aletta Edouard M.D.   On: 10/06/2020 08:17   IR Fluoro Guide CV Line Left  Result Date: 09/29/2020 INDICATION: Acute renal insufficiency. Please perform image guided placement of temporary dialysis catheter for the initiation dialysis Note, the decision was made to proceed with placement of a temporary dialysis catheter as there is hope the patient will only temporary hemodialysis per discussion with the providing nephrology service. EXAM: NON-TUNNELED CENTRAL VENOUS HEMODIALYSIS CATHETER PLACEMENT WITH ULTRASOUND AND FLUOROSCOPIC GUIDANCE COMPARISON:  None. MEDICATIONS: None FLUOROSCOPY TIME:  36 seconds (10 mGy) COMPLICATIONS: None immediate. PROCEDURE: Informed written consent was obtained from the patient after a discussion of the risks, benefits, and alternatives to treatment. Questions regarding the procedure were encouraged and answered. Given the presence of the right internal jugular approach port a catheter decision was made to place a left internal jugular approach  temporary dialysis catheter As such the left neck and chest were prepped with chlorhexidine in a sterile fashion, and a sterile drape was applied covering the operative field. Maximum barrier sterile technique with sterile gowns and gloves were used for the procedure. A timeout was performed prior to the initiation of the procedure. After the overlying soft tissues were anesthetized, a small venotomy incision was created and a micropuncture kit was utilized to access the internal jugular  vein. Real-time ultrasound guidance was utilized for vascular access including the acquisition of a permanent ultrasound image documenting patency of the accessed vessel. The microwire was utilized to measure appropriate catheter length. A stiff glidewire was advanced to the level of the IVC. Under fluoroscopic guidance, the venotomy was serially dilated, ultimately allowing placement of a 20 cm temporary Mahurkar catheter with tip ultimately terminating within the superior aspect of the right atrium. Final catheter positioning was confirmed and documented with a spot radiographic image. The catheter aspirates and flushes normally. The catheter was flushed with appropriate volume heparin dwells. The catheter exit site was secured with a 0-Prolene retention suture. A dressing was placed. The patient tolerated the procedure well without immediate post procedural complication. IMPRESSION: Successful placement of a left internal jugular approach 20 cm temporary dialysis catheter with tip terminating with in the superior aspect of the right atrium. The catheter is ready for immediate use. PLAN: This catheter may be converted to a tunneled dialysis catheter at a later date as indicated. Electronically Signed   By: Sandi Mariscal M.D.   On: 09/29/2020 15:47   IR US Guide Vasc Access Left  Result Date: 10/06/2020 CLINICAL DATA:  Renal failure requiring hemodialysis, history of multiple myeloma and need for tunneled hemodialysis catheter. The patient currently has a non tunneled temporary dialysis catheter via the left internal jugular vein which was placed on 09/29/2020. He has an indwelling right jugular Port-A-Cath. EXAM: TUNNELED CENTRAL VENOUS HEMODIALYSIS CATHETER PLACEMENT WITH ULTRASOUND AND FLUOROSCOPIC GUIDANCE ANESTHESIA/SEDATION: 0.5 mg IV Versed; 25 mcg IV Fentanyl. Total Moderate Sedation Time:   30 minutes. The patient's level of consciousness and physiologic status were continuously monitored during the  procedure by Radiology nursing. MEDICATIONS: 2 g IV Ancef. FLUOROSCOPY TIME:  30 seconds. PROCEDURE: The procedure, risks, benefits, and alternatives were explained to the patient. Questions regarding the procedure were encouraged and answered. The patient understands and consents to the procedure. A timeout was performed prior to initiating the procedure. The indwelling left jugular non tunneled hemodialysis catheter was removed and manual compression applied over the exit site. The left neck and chest were then prepped with chlorhexidine in a sterile fashion, and a sterile drape was applied covering the operative field. Maximum barrier sterile technique with sterile gowns and gloves were used for the procedure. Local anesthesia was provided with 1% lidocaine. Patency of the left internal jugular vein was confirmed by ultrasound. After creating a small venotomy incision, a 21 gauge needle was advanced into the left internal jugular vein under direct, real-time ultrasound guidance. Ultrasound image documentation was performed. After securing guidewire access, an 8 Fr dilator was placed. A J-wire was kinked to measure appropriate catheter length. A Palindrome tunneled hemodialysis catheter measuring 23 cm from tip to cuff was chosen for placement. This was tunneled in a retrograde fashion from the chest wall to the venotomy incision. At the venotomy, serial dilatation was performed and a 15 Fr peel-away sheath was placed over a guidewire. The catheter was then placed through the sheath and the sheath  removed. Final catheter positioning was confirmed and documented with a fluoroscopic spot image. The catheter was aspirated, flushed with saline, and injected with appropriate volume heparin dwells. The venotomy incision was closed with subcuticular 4-0 Vicryl. Dermabond was applied to the incision. The catheter exit site was secured with 0-Prolene retention sutures. COMPLICATIONS: None.  No pneumothorax. FINDINGS: After  catheter placement, the tip lies in the right atrium. The catheter aspirates normally and is ready for immediate use. IMPRESSION: Placement of tunneled hemodialysis catheter via new access of the left internal jugular vein. The catheter tip lies in the right atrium. The catheter is ready for immediate use. Prior to tunnel catheter placement the non tunneled temporary left IJ catheter was removed. Electronically Signed   By: Aletta Edouard M.D.   On: 10/06/2020 08:17   IR US Guide Vasc Access Left  Result Date: 09/29/2020 INDICATION: Acute renal insufficiency. Please perform image guided placement of temporary dialysis catheter for the initiation dialysis Note, the decision was made to proceed with placement of a temporary dialysis catheter as there is hope the patient will only temporary hemodialysis per discussion with the providing nephrology service. EXAM: NON-TUNNELED CENTRAL VENOUS HEMODIALYSIS CATHETER PLACEMENT WITH ULTRASOUND AND FLUOROSCOPIC GUIDANCE COMPARISON:  None. MEDICATIONS: None FLUOROSCOPY TIME:  36 seconds (10 mGy) COMPLICATIONS: None immediate. PROCEDURE: Informed written consent was obtained from the patient after a discussion of the risks, benefits, and alternatives to treatment. Questions regarding the procedure were encouraged and answered. Given the presence of the right internal jugular approach port a catheter decision was made to place a left internal jugular approach temporary dialysis catheter As such the left neck and chest were prepped with chlorhexidine in a sterile fashion, and a sterile drape was applied covering the operative field. Maximum barrier sterile technique with sterile gowns and gloves were used for the procedure. A timeout was performed prior to the initiation of the procedure. After the overlying soft tissues were anesthetized, a small venotomy incision was created and a micropuncture kit was utilized to access the internal jugular vein. Real-time ultrasound  guidance was utilized for vascular access including the acquisition of a permanent ultrasound image documenting patency of the accessed vessel. The microwire was utilized to measure appropriate catheter length. A stiff glidewire was advanced to the level of the IVC. Under fluoroscopic guidance, the venotomy was serially dilated, ultimately allowing placement of a 20 cm temporary Mahurkar catheter with tip ultimately terminating within the superior aspect of the right atrium. Final catheter positioning was confirmed and documented with a spot radiographic image. The catheter aspirates and flushes normally. The catheter was flushed with appropriate volume heparin dwells. The catheter exit site was secured with a 0-Prolene retention suture. A dressing was placed. The patient tolerated the procedure well without immediate post procedural complication. IMPRESSION: Successful placement of a left internal jugular approach 20 cm temporary dialysis catheter with tip terminating with in the superior aspect of the right atrium. The catheter is ready for immediate use. PLAN: This catheter may be converted to a tunneled dialysis catheter at a later date as indicated. Electronically Signed   By: Sandi Mariscal M.D.   On: 09/29/2020 15:47   DG Chest Port 1 View  Result Date: 09/28/2020 CLINICAL DATA:  69 year old male with shortness of breath. EXAM: PORTABLE CHEST 1 VIEW COMPARISON:  Chest radiograph dated 05/18/2020. FINDINGS: Right-sided Port-A-Cath in similar position. There is diffuse interstitial coarsening and mild chronic bronchitic changes. There are bibasilar atelectasis/scarring. No focal consolidation, pleural effusion, pneumothorax. Stable  cardiac silhouette. No acute osseous pathology. Old rib fractures. IMPRESSION: No acute cardiopulmonary process. Electronically Signed   By: Anner Crete M.D.   On: 09/28/2020 18:30     ASSESSMENT:   1. IgG kappa multiple myeloma: -4 cycles of RVD from 08/30/2017 through  02/18/2018. -Declined bone marrow transplant. Received maintenance Revlimid 2.5 mg 3 weeks on 1 week off. -Bone marrow biopsy on 02/23/2020 shows 21% plasma cells. Occasional sections had up to 75% myeloma cells. -Chromosome analysis was normal. FISH panel was positive for gain of 1 q., monosomy 13, del 17 P, t(14;20) -PET scan showed new left frontal destructive lesion. Numerous areas of lytic changes in the spine. Lucent areas in T6 with loss of height at T5. Numerous lytic changes throughout the spine noted along with pedicle and lamina and transverse process of T8 vertebral body. Profound hypermetabolic activity within the ribs bilaterally. -Daratumumab was started on 04/11/2020.Pomalidomide 2 mg 3 weeks on 1 week off started on 06/20/2020. - Progression noted with increased Kappa light chains and k/L ratio, hence switched to selenixir/velcade/dex.  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 -Completed XRT on 04/05/2020. -MRI thoracic spine on 07/26/2020 showed significant overall improvement in multiple myeloma with much of the bone marrow has returned to normal fatty marrow.  Ventral epidural tumor on the right of T5 near completely resolved.  Proximal right T7 rib lesion also resolved.  3. 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 He is on Eliquis.   PLAN:  1. IgG kappa multiple myeloma:  -We have reviewed myeloma labs from  09/29/2020, k/L ratio of 687.27 and kappa free light chain of 5360. -He was supposed to start  Selenixor, velcade and dex but got admitted to the hospital. selinexor low-dose 40 mg weekly and titrated up as tolerated.  Bortezomib is 1.3 mg per metered square once weekly for 4 weeks every 35 days.  He will continue dexamethasone 20  mg once weekly. 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  We will closely monitor.  If there is any worsening, will cut back on dose of bortezomib. Dr Raliegh Ip discussed side effects of selinexor in detail including but not limited to GI toxicity, fluid retention, cytopenias among others.   C1D1 of selenixor/velcade/dex today. We will see him back in 2 weeks after start of treatment to see how he is tolerating.  2. CKD, stage V, Dialysis Tue/thu/sat  3. Left leg DVT: -Continue Eliquis 2.5 mg daily.  No bleeding issues.  4.Diabetes: -Continue Tresiba and NovoLog.  Continue follow-up with Dr. Willey Blade.  5. Insomnia: -Continue trazodone 100 mg at bedtime.  6. Lower extremity swelling: -Resolved.  7. Urinary hesitancy: -Continue Flomax 0.4 mg daily.  8. 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  Benay Pike MD

## 2020-10-07 ENCOUNTER — Other Ambulatory Visit: Payer: Self-pay

## 2020-10-07 ENCOUNTER — Inpatient Hospital Stay (HOSPITAL_COMMUNITY): Payer: Medicare Other

## 2020-10-07 ENCOUNTER — Encounter (HOSPITAL_COMMUNITY): Payer: Self-pay | Admitting: Hematology and Oncology

## 2020-10-07 ENCOUNTER — Inpatient Hospital Stay (HOSPITAL_BASED_OUTPATIENT_CLINIC_OR_DEPARTMENT_OTHER): Payer: Medicare Other | Admitting: Hematology and Oncology

## 2020-10-07 ENCOUNTER — Encounter (HOSPITAL_COMMUNITY): Payer: Self-pay

## 2020-10-07 VITALS — BP 119/52 | HR 81 | Temp 97.3°F | Resp 18 | Wt 212.6 lb

## 2020-10-07 DIAGNOSIS — C9000 Multiple myeloma not having achieved remission: Secondary | ICD-10-CM

## 2020-10-07 DIAGNOSIS — Z7901 Long term (current) use of anticoagulants: Secondary | ICD-10-CM | POA: Diagnosis not present

## 2020-10-07 DIAGNOSIS — Z86718 Personal history of other venous thrombosis and embolism: Secondary | ICD-10-CM | POA: Diagnosis not present

## 2020-10-07 DIAGNOSIS — Z79899 Other long term (current) drug therapy: Secondary | ICD-10-CM | POA: Diagnosis not present

## 2020-10-07 LAB — CBC WITH DIFFERENTIAL/PLATELET
Abs Immature Granulocytes: 0.07 10*3/uL (ref 0.00–0.07)
Basophils Absolute: 0 10*3/uL (ref 0.0–0.1)
Basophils Relative: 0 %
Eosinophils Absolute: 0.1 10*3/uL (ref 0.0–0.5)
Eosinophils Relative: 1 %
HCT: 33.3 % — ABNORMAL LOW (ref 39.0–52.0)
Hemoglobin: 11.3 g/dL — ABNORMAL LOW (ref 13.0–17.0)
Immature Granulocytes: 1 %
Lymphocytes Relative: 12 %
Lymphs Abs: 1.5 10*3/uL (ref 0.7–4.0)
MCH: 32.8 pg (ref 26.0–34.0)
MCHC: 33.9 g/dL (ref 30.0–36.0)
MCV: 96.5 fL (ref 80.0–100.0)
Monocytes Absolute: 0.8 10*3/uL (ref 0.1–1.0)
Monocytes Relative: 6 %
Neutro Abs: 10.2 10*3/uL — ABNORMAL HIGH (ref 1.7–7.7)
Neutrophils Relative %: 80 %
Platelets: 203 10*3/uL (ref 150–400)
RBC: 3.45 MIL/uL — ABNORMAL LOW (ref 4.22–5.81)
RDW: 17.5 % — ABNORMAL HIGH (ref 11.5–15.5)
WBC: 12.7 10*3/uL — ABNORMAL HIGH (ref 4.0–10.5)
nRBC: 0 % (ref 0.0–0.2)

## 2020-10-07 LAB — COMPREHENSIVE METABOLIC PANEL
ALT: 30 U/L (ref 0–44)
AST: 28 U/L (ref 15–41)
Albumin: 4.2 g/dL (ref 3.5–5.0)
Alkaline Phosphatase: 85 U/L (ref 38–126)
Anion gap: 16 — ABNORMAL HIGH (ref 5–15)
BUN: 30 mg/dL — ABNORMAL HIGH (ref 8–23)
CO2: 26 mmol/L (ref 22–32)
Calcium: 9.2 mg/dL (ref 8.9–10.3)
Chloride: 94 mmol/L — ABNORMAL LOW (ref 98–111)
Creatinine, Ser: 5.5 mg/dL — ABNORMAL HIGH (ref 0.61–1.24)
GFR, Estimated: 10 mL/min — ABNORMAL LOW (ref >60)
Glucose, Bld: 114 mg/dL — ABNORMAL HIGH (ref 70–99)
Potassium: 3.3 mmol/L — ABNORMAL LOW (ref 3.5–5.1)
Sodium: 136 mmol/L (ref 135–145)
Total Bilirubin: 1.4 mg/dL — ABNORMAL HIGH (ref 0.3–1.2)
Total Protein: 6.9 g/dL (ref 6.5–8.1)

## 2020-10-07 MED ORDER — BORTEZOMIB CHEMO SQ INJECTION 3.5 MG (2.5MG/ML)
1.3000 mg/m2 | Freq: Once | INTRAMUSCULAR | Status: AC
  Administered 2020-10-07: 3 mg via SUBCUTANEOUS
  Filled 2020-10-07: qty 1.2

## 2020-10-07 MED ORDER — DEXAMETHASONE 4 MG PO TABS
20.0000 mg | ORAL_TABLET | Freq: Once | ORAL | Status: AC
  Administered 2020-10-07: 20 mg via ORAL
  Filled 2020-10-07: qty 5

## 2020-10-07 NOTE — Patient Instructions (Signed)
[facility] at [facility] Discharge Instructions  You were seen today by Dr. Iruku. Follow up as scheduled.   Thank you for choosing [facility] at [facility] to provide your oncology and hematology care.  To afford each patient quality time with our provider, please arrive at least 15 minutes before your scheduled appointment time.   If you have a lab appointment with the Cancer Center please come in thru the Main Entrance and check in at the main information desk.  You need to re-schedule your appointment should you arrive 10 or more minutes late.  We strive to give you quality time with our providers, and arriving late affects you and other patients whose appointments are after yours.  Also, if you no show three or more times for appointments you may be dismissed from the clinic at the providers discretion.     Again, thank you for choosing [facility].  Our hope is that these requests will decrease the amount of time that you wait before being seen by our physicians.       _____________________________________________________________  Should you have questions after your visit to  Cancer Center, please contact our office at [phone] and follow the prompts.  Our office hours are 8:00 a.m. and 4:30 p.m. Monday - Friday.  Please note that voicemails left after 4:00 p.m. may not be returned until the following business day.  We are closed weekends and major holidays.  You do have access to a nurse 24-7, just call the main number to the clinic [phone] and do not press any options, hold on the line and a nurse will answer the phone.    For prescription refill requests, have your pharmacy contact our office and allow 72 hours.    Due to Covid, you will need to wear a mask upon entering the hospital. If you do not have a mask, a mask will be given to you at the Main Entrance upon arrival. For doctor visits, patients may  have 1 support person age 18 or older with them. For treatment visits, patients can not have anyone with them due to social distancing guidelines and our immunocompromised population.     

## 2020-10-07 NOTE — Progress Notes (Signed)
Ok to treat today verbal order Dr. Chryl Heck.   Patient tolerated Velcade injection with no complaints voiced.  Lab work reviewed.  See MAR for details.  Injection site clean and dry with no bruising or swelling noted.  Band aid applied.  VSS.  Patient left in satisfactory condition with no s/s of distress noted.

## 2020-10-08 DIAGNOSIS — D649 Anemia, unspecified: Secondary | ICD-10-CM | POA: Diagnosis not present

## 2020-10-08 DIAGNOSIS — Z992 Dependence on renal dialysis: Secondary | ICD-10-CM | POA: Diagnosis not present

## 2020-10-08 DIAGNOSIS — N178 Other acute kidney failure: Secondary | ICD-10-CM | POA: Diagnosis not present

## 2020-10-08 DIAGNOSIS — E119 Type 2 diabetes mellitus without complications: Secondary | ICD-10-CM | POA: Diagnosis not present

## 2020-10-08 DIAGNOSIS — D509 Iron deficiency anemia, unspecified: Secondary | ICD-10-CM | POA: Diagnosis not present

## 2020-10-08 DIAGNOSIS — N2581 Secondary hyperparathyroidism of renal origin: Secondary | ICD-10-CM | POA: Diagnosis not present

## 2020-10-10 ENCOUNTER — Other Ambulatory Visit (HOSPITAL_COMMUNITY): Payer: Self-pay | Admitting: Surgery

## 2020-10-10 DIAGNOSIS — C9000 Multiple myeloma not having achieved remission: Secondary | ICD-10-CM

## 2020-10-10 MED ORDER — XPOVIO (40 MG ONCE WEEKLY) 40 MG PO TBPK: 40.0000 mg | ORAL_TABLET | ORAL | 0 refills | Status: DC

## 2020-10-11 ENCOUNTER — Other Ambulatory Visit: Payer: Self-pay

## 2020-10-11 ENCOUNTER — Ambulatory Visit: Payer: Medicare Other | Admitting: Urology

## 2020-10-11 ENCOUNTER — Ambulatory Visit (INDEPENDENT_AMBULATORY_CARE_PROVIDER_SITE_OTHER): Payer: Medicare Other | Admitting: Urology

## 2020-10-11 ENCOUNTER — Encounter: Payer: Self-pay | Admitting: Urology

## 2020-10-11 VITALS — BP 117/71 | HR 103 | Temp 97.6°F | Ht 68.0 in | Wt 212.0 lb

## 2020-10-11 DIAGNOSIS — E119 Type 2 diabetes mellitus without complications: Secondary | ICD-10-CM | POA: Diagnosis not present

## 2020-10-11 DIAGNOSIS — R39198 Other difficulties with micturition: Secondary | ICD-10-CM | POA: Diagnosis not present

## 2020-10-11 DIAGNOSIS — N2581 Secondary hyperparathyroidism of renal origin: Secondary | ICD-10-CM | POA: Diagnosis not present

## 2020-10-11 DIAGNOSIS — D509 Iron deficiency anemia, unspecified: Secondary | ICD-10-CM | POA: Diagnosis not present

## 2020-10-11 DIAGNOSIS — D649 Anemia, unspecified: Secondary | ICD-10-CM | POA: Diagnosis not present

## 2020-10-11 DIAGNOSIS — Z992 Dependence on renal dialysis: Secondary | ICD-10-CM | POA: Diagnosis not present

## 2020-10-11 DIAGNOSIS — N178 Other acute kidney failure: Secondary | ICD-10-CM | POA: Diagnosis not present

## 2020-10-11 NOTE — Progress Notes (Signed)
H&P  Chief Complaint: BPH w/ LUTS  History of Present Illness:   12.14.2021: Pt has been placed on dialysis since his last visit d/t complications from multiple myeloma. He  reports that he is making very little urine. He continues on alfuzosin and reports that his urinary symptoms have been manageable.  (below copied from [place] records):  9.14.2021: Pt presents today with a weakened FOS (onset 2-3 weeks prior). Pt was started on tamsulosin by his oncologist but he is not currently taking it. He does not think it helped very much. Pt notes that his general urinary symptomatology may predate his 3 week estimation, but that six months ago he was asymptomatic. Pt reports that approximately 2 months ago an MRI revealed a renal mass (cyst). This has been known about for years. 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  Pt denies any recent constipation. Pt was hospitalized with anemia and diagnosed with Multiple Myeloma (came out of remission in June).  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[place])    Cervical dystonia    Diabetes mellitus without complication ([place])    Gout    Hypercholesteremia     Past Surgical History:  Procedure Laterality Date   BONE MARROW BIOPSY     CHOLECYSTECTOMY  2007   COLONOSCOPY WITH PROPOFOL N/A 01/16/2018   Procedure: COLONOSCOPY WITH PROPOFOL;  Surgeon: Daneil Dolin, MD;  [facility];  Service: Endoscopy;  Laterality: N/A;  1:45pm   ESOPHAGOGASTRODUODENOSCOPY (EGD) WITH PROPOFOL N/A 01/16/2018   Procedure: ESOPHAGOGASTRODUODENOSCOPY (EGD) WITH PROPOFOL;  Surgeon: Daneil Dolin, MD;  Location: [facility];  Service: Endoscopy;  Laterality: N/A;   GIVENS CAPSULE STUDY N/A 05/12/2018   Procedure: GIVENS CAPSULE STUDY;  Surgeon: Daneil Dolin, MD;  Location: [facility];  Service: Endoscopy;  Laterality: N/A;  7:30am   IR FLUORO GUIDE CV LINE  LEFT  09/29/2020   IR FLUORO GUIDE CV LINE LEFT  10/05/2020   IR US GUIDE VASC ACCESS LEFT  09/29/2020   IR US GUIDE VASC ACCESS LEFT  10/05/2020   NEPHRECTOMY Right 1998   cancer   PORTACATH PLACEMENT Right 04/07/2020   Procedure: INSERTION PORT-A-CATH (attached catherter in right internal jugular);  Surgeon: Virl Cagey, MD;  Location: [facility] ORS;  Service: General;  Laterality: Right;    Home Medications:  Allergies as of 10/11/2020   No Known Allergies     Medication List       Accurate as of October 11, 2020  9:18 AM. If you have any questions, ask your nurse or doctor.        acetaminophen 325 MG tablet Commonly known as: TYLENOL Take 650 mg by mouth every 6 (six) hours as needed.   acyclovir 200 MG capsule Commonly known as: ZOVIRAX Take 1 capsule (200 mg total) by mouth 2 (two) times daily.   alfuzosin 10 MG 24 hr tablet Commonly known as: UROXATRAL Take 1 tablet (10 mg total) by mouth daily with breakfast.   allopurinol 100 MG tablet Commonly known as: ZYLOPRIM Take 2 tablets (200 mg total) by mouth every morning.   apixaban 2.5 MG Tabs tablet Commonly known as: Eliquis Take 1 tablet (2.5 mg total) by mouth daily. Start taking from tomorrow 10/06/2020   B-D UF III MINI PEN NEEDLES 31G X 5 MM Misc Generic drug: Insulin Pen Needle  Centrum Silver 50+Men Tabs Take 1 tablet by mouth every morning.   cyclobenzaprine 10 MG tablet Commonly known as: FLEXERIL TAKE 1 TABLET THREE TIMES DAILY AS NEEDED FOR MUSCLE SPASM What changed: See the new instructions.   dexamethasone 4 MG tablet Commonly known as: DECADRON Take 5 tablets (20 mg total) by mouth once a week.   dicyclomine 10 MG capsule Commonly known as: BENTYL Take 10 mg by mouth 2 (two) times daily as needed.   diphenoxylate-atropine 2.5-0.025 MG tablet Commonly known as: LOMOTIL Take 2 tablets by mouth 4 (four) times daily as needed.   furosemide 40 MG tablet Commonly known as: LASIX Take  1.5 tablets (60 mg total) by mouth daily.   gabapentin 300 MG capsule Commonly known as: NEURONTIN Take 1 capsule (300 mg total) by mouth at bedtime.   HYDROcodone-acetaminophen 5-325 MG tablet Commonly known as: NORCO/VICODIN Take 1 tablet by mouth 2 (two) times daily as needed for severe pain.   NovoLOG FlexPen 100 UNIT/ML FlexPen Generic drug: insulin aspart Inject 8 Units into the skin 3 (three) times daily with meals.   pravastatin 20 MG tablet Commonly known as: PRAVACHOL Take 20 mg by mouth at bedtime.   traZODone 100 MG tablet Commonly known as: DESYREL Take 1 tablet (100 mg total) by mouth at bedtime.   Tyler Aas FlexTouch 200 UNIT/ML FlexTouch Pen Generic drug: insulin degludec Inject 16 Units into the skin daily.   True Metrix Blood Glucose Test test strip Generic drug: glucose blood   Xpovio 40 MG Tbpk Generic drug: selinexor Take 40 mg by mouth once a week.       Allergies: No Known Allergies  Family History  Problem Relation Age of Onset   Heart failure Mother 64   Dementia Father    Colon cancer Neg Hx     Social History:  reports that he has never smoked. He quit smokeless tobacco use about 3 years ago.  His smokeless tobacco use included chew. He reports that he does not drink alcohol and does not use drugs.  ROS: A complete review of systems was performed.  All systems are negative except for pertinent findings as noted.  Physical Exam:  Vital signs in last 24 hours: There were no vitals taken for this visit. Constitutional:  Alert and oriented, No acute distress Respiratory: Normal respiratory effort Neurologic: Grossly intact, no focal deficits Psychiatric: Normal mood and affect  I have reviewed prior pt notes  I have reviewed notes from referring/previous physicians  I have independently reviewed prior imaging (recent dialysis catheter placed in IR)   Impression/Assessment:  BPH w/ LUTS: Pt urinary symptoms are well managed on  alfuzosin at this time. Oliguric   Plan:  1. Questions answered regarding kidney function. He will call for an appointment if he has significant LUTS following cessation of dialysis regimen. I recommended that he ask renal c=doc if lasix needed  2. F/U in 6 months for OV and symptom recheck.  CC: Dr. Asencion Noble

## 2020-10-11 NOTE — Progress Notes (Signed)
Urological Symptom Review  Patient is experiencing the following symptoms: Injury to kidneys/bladder   Review of Systems  Gastrointestinal (upper)  : Negative for upper GI symptoms  Gastrointestinal (lower) : Negative for lower GI symptoms  Constitutional : Negative for symptoms  Skin: Negative for skin symptoms  Eyes: Negative for eye symptoms  Ear/Nose/Throat : Negative for Ear/Nose/Throat symptoms  Hematologic/Lymphatic:Cardiovascular  Easy bruising/bleeding  Negative for cardiovascular symptoms  Respiratory : Negative for respiratory symptoms  Endocrine: Negative for endocrine symptoms  Musculoskeletal: Negative for musculoskeletal symptoms  Neurological: Negative for neurological symptoms  Psychologic: Negative for psychiatric symptoms

## 2020-10-11 NOTE — Progress Notes (Signed)
  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      Indication for treatment:  Palliation       Radiation treatment dates:   03/22/20-04/05/20  Site/dose:   20 Gy in 10 fractions to T5  Beams/energy:   Two static gantry MLC shaped fields from zero and 180 degrees with 6 MV  Narrative: The patient tolerated radiation treatment relatively well.     Plan: The patient has completed radiation treatment. The patient will return to radiation oncology clinic for routine followup in one month. I advised him to call or return sooner if he has any questions or concerns related to his recovery or treatment. ________________________________  Sheral Apley. Tammi Klippel, M.D.

## 2020-10-13 ENCOUNTER — Other Ambulatory Visit (HOSPITAL_COMMUNITY): Payer: Self-pay

## 2020-10-13 DIAGNOSIS — D509 Iron deficiency anemia, unspecified: Secondary | ICD-10-CM | POA: Diagnosis not present

## 2020-10-13 DIAGNOSIS — D649 Anemia, unspecified: Secondary | ICD-10-CM | POA: Diagnosis not present

## 2020-10-13 DIAGNOSIS — N178 Other acute kidney failure: Secondary | ICD-10-CM | POA: Diagnosis not present

## 2020-10-13 DIAGNOSIS — Z992 Dependence on renal dialysis: Secondary | ICD-10-CM | POA: Diagnosis not present

## 2020-10-13 DIAGNOSIS — N2581 Secondary hyperparathyroidism of renal origin: Secondary | ICD-10-CM | POA: Diagnosis not present

## 2020-10-13 DIAGNOSIS — E119 Type 2 diabetes mellitus without complications: Secondary | ICD-10-CM | POA: Diagnosis not present

## 2020-10-13 DIAGNOSIS — N179 Acute kidney failure, unspecified: Secondary | ICD-10-CM | POA: Diagnosis not present

## 2020-10-14 ENCOUNTER — Inpatient Hospital Stay (HOSPITAL_COMMUNITY): Payer: Medicare Other

## 2020-10-14 ENCOUNTER — Other Ambulatory Visit (HOSPITAL_COMMUNITY): Payer: Self-pay | Admitting: Hematology and Oncology

## 2020-10-14 ENCOUNTER — Other Ambulatory Visit: Payer: Self-pay

## 2020-10-14 VITALS — BP 118/58 | HR 89 | Temp 97.1°F | Resp 18 | Wt 227.4 lb

## 2020-10-14 DIAGNOSIS — C9000 Multiple myeloma not having achieved remission: Secondary | ICD-10-CM

## 2020-10-14 DIAGNOSIS — Z86718 Personal history of other venous thrombosis and embolism: Secondary | ICD-10-CM | POA: Diagnosis not present

## 2020-10-14 DIAGNOSIS — Z7901 Long term (current) use of anticoagulants: Secondary | ICD-10-CM | POA: Diagnosis not present

## 2020-10-14 DIAGNOSIS — Z79899 Other long term (current) drug therapy: Secondary | ICD-10-CM | POA: Diagnosis not present

## 2020-10-14 DIAGNOSIS — D649 Anemia, unspecified: Secondary | ICD-10-CM

## 2020-10-14 LAB — CBC WITH DIFFERENTIAL/PLATELET
Abs Immature Granulocytes: 0.12 10*3/uL — ABNORMAL HIGH (ref 0.00–0.07)
Basophils Absolute: 0 10*3/uL (ref 0.0–0.1)
Basophils Relative: 0 %
Eosinophils Absolute: 0.1 10*3/uL (ref 0.0–0.5)
Eosinophils Relative: 2 %
HCT: 23.4 % — ABNORMAL LOW (ref 39.0–52.0)
Hemoglobin: 7.6 g/dL — ABNORMAL LOW (ref 13.0–17.0)
Immature Granulocytes: 2 %
Lymphocytes Relative: 13 %
Lymphs Abs: 0.8 10*3/uL (ref 0.7–4.0)
MCH: 32.5 pg (ref 26.0–34.0)
MCHC: 32.5 g/dL (ref 30.0–36.0)
MCV: 100 fL (ref 80.0–100.0)
Monocytes Absolute: 0.5 10*3/uL (ref 0.1–1.0)
Monocytes Relative: 9 %
Neutro Abs: 4.3 10*3/uL (ref 1.7–7.7)
Neutrophils Relative %: 74 %
Platelets: 84 10*3/uL — ABNORMAL LOW (ref 150–400)
RBC: 2.34 MIL/uL — ABNORMAL LOW (ref 4.22–5.81)
RDW: 17.2 % — ABNORMAL HIGH (ref 11.5–15.5)
WBC: 5.9 10*3/uL (ref 4.0–10.5)
nRBC: 0 % (ref 0.0–0.2)

## 2020-10-14 LAB — COMPREHENSIVE METABOLIC PANEL
ALT: 16 U/L (ref 0–44)
AST: 15 U/L (ref 15–41)
Albumin: 3.3 g/dL — ABNORMAL LOW (ref 3.5–5.0)
Alkaline Phosphatase: 59 U/L (ref 38–126)
Anion gap: 12 (ref 5–15)
BUN: 33 mg/dL — ABNORMAL HIGH (ref 8–23)
CO2: 28 mmol/L (ref 22–32)
Calcium: 8.6 mg/dL — ABNORMAL LOW (ref 8.9–10.3)
Chloride: 96 mmol/L — ABNORMAL LOW (ref 98–111)
Creatinine, Ser: 6.2 mg/dL — ABNORMAL HIGH (ref 0.61–1.24)
GFR, Estimated: 9 mL/min — ABNORMAL LOW (ref >60)
Glucose, Bld: 109 mg/dL — ABNORMAL HIGH (ref 70–99)
Potassium: 3.4 mmol/L — ABNORMAL LOW (ref 3.5–5.1)
Sodium: 136 mmol/L (ref 135–145)
Total Bilirubin: 0.7 mg/dL (ref 0.3–1.2)
Total Protein: 6 g/dL — ABNORMAL LOW (ref 6.5–8.1)

## 2020-10-14 LAB — PREPARE RBC (CROSSMATCH)

## 2020-10-14 MED ORDER — DEXAMETHASONE 4 MG PO TABS
20.0000 mg | ORAL_TABLET | Freq: Once | ORAL | Status: AC
  Administered 2020-10-14: 10:00:00 20 mg via ORAL
  Filled 2020-10-14: qty 5

## 2020-10-14 MED ORDER — BORTEZOMIB CHEMO SQ INJECTION 3.5 MG (2.5MG/ML)
1.3000 mg/m2 | Freq: Once | INTRAMUSCULAR | Status: AC
  Administered 2020-10-14: 11:00:00 3 mg via SUBCUTANEOUS
  Filled 2020-10-14: qty 1.2

## 2020-10-14 NOTE — Progress Notes (Signed)
Labs reviewed by Dr. Chryl Heck and message received to proceed with treatment today. MD aware aware of creatinine 6.20, Hgb 7.6, Platelets 84, and potassium 3.4. Message received via secure chat to proceed with treatment today. Patient started dialysis this week.

## 2020-10-14 NOTE — Progress Notes (Signed)
I called and discussed the risks and benefits of blood transfusion, including serious allergic reactions. He previously had blood transfusions and never had any difficulty. He is agreeable to proceed with transfusion.  Tom Marshall

## 2020-10-14 NOTE — Patient Instructions (Signed)
[facility] Discharge Instructions for Patients Receiving Chemotherapy  Today you received the following chemotherapy agents   To help prevent nausea and vomiting after your treatment, we encourage you to take your nausea medication   If you develop nausea and vomiting that is not controlled by your nausea medication, call the clinic.   BELOW ARE SYMPTOMS THAT SHOULD BE REPORTED IMMEDIATELY:  *FEVER GREATER THAN 100.5 F  *CHILLS WITH OR WITHOUT FEVER  NAUSEA AND VOMITING THAT IS NOT CONTROLLED WITH YOUR NAUSEA MEDICATION  *UNUSUAL SHORTNESS OF BREATH  *UNUSUAL BRUISING OR BLEEDING  TENDERNESS IN MOUTH AND THROAT WITH OR WITHOUT PRESENCE OF ULCERS  *URINARY PROBLEMS  *BOWEL PROBLEMS  UNUSUAL RASH Items with * indicate a potential emergency and should be followed up as soon as possible.  Feel free to call the clinic should you have any questions or concerns. The clinic phone number is [phone].  Please show the CHEMO ALERT CARD at check-in to the Emergency Department and triage nurse.   

## 2020-10-14 NOTE — Progress Notes (Signed)
Patient to return Monday 10/17/20 for 1UPRBC.

## 2020-10-14 NOTE — Progress Notes (Signed)
Patient presents today for Velcade injection.  Vital signs stable.  Patient was recently in the hospital but states that he is doing much better.  No new complaints.  Patient instructed by Dr Chryl Heck to take his XPOVIO today and then every Friday prior to Velcade injection.  Labs reviewed Creatinine 6.20, Hgb 7.6, Platelets 84, and potassium 3.4.  Patient is asymptomatic.    Treatment given today per MD orders.  Tolerated injection without adverse affects.  Injection sit WNL.  Vital signs stable.  No complaints at this time.  Discharge from clinic ambulatory in stable condition.  Alert and oriented X 3.  Follow up with [facility] as scheduled.

## 2020-10-15 DIAGNOSIS — D649 Anemia, unspecified: Secondary | ICD-10-CM | POA: Diagnosis not present

## 2020-10-15 DIAGNOSIS — Z992 Dependence on renal dialysis: Secondary | ICD-10-CM | POA: Diagnosis not present

## 2020-10-15 DIAGNOSIS — E119 Type 2 diabetes mellitus without complications: Secondary | ICD-10-CM | POA: Diagnosis not present

## 2020-10-15 DIAGNOSIS — D509 Iron deficiency anemia, unspecified: Secondary | ICD-10-CM | POA: Diagnosis not present

## 2020-10-15 DIAGNOSIS — N178 Other acute kidney failure: Secondary | ICD-10-CM | POA: Diagnosis not present

## 2020-10-15 DIAGNOSIS — N2581 Secondary hyperparathyroidism of renal origin: Secondary | ICD-10-CM | POA: Diagnosis not present

## 2020-10-17 ENCOUNTER — Encounter (HOSPITAL_COMMUNITY): Payer: Self-pay

## 2020-10-17 ENCOUNTER — Other Ambulatory Visit: Payer: Self-pay

## 2020-10-17 ENCOUNTER — Inpatient Hospital Stay (HOSPITAL_COMMUNITY): Payer: Medicare Other

## 2020-10-17 DIAGNOSIS — Z86718 Personal history of other venous thrombosis and embolism: Secondary | ICD-10-CM | POA: Diagnosis not present

## 2020-10-17 DIAGNOSIS — Z79899 Other long term (current) drug therapy: Secondary | ICD-10-CM | POA: Diagnosis not present

## 2020-10-17 DIAGNOSIS — C9000 Multiple myeloma not having achieved remission: Secondary | ICD-10-CM

## 2020-10-17 DIAGNOSIS — Z7901 Long term (current) use of anticoagulants: Secondary | ICD-10-CM | POA: Diagnosis not present

## 2020-10-17 DIAGNOSIS — D649 Anemia, unspecified: Secondary | ICD-10-CM

## 2020-10-17 MED ORDER — SODIUM CHLORIDE 0.9% FLUSH
10.0000 mL | INTRAVENOUS | Status: AC | PRN
  Administered 2020-10-17: 10:00:00 10 mL

## 2020-10-17 MED ORDER — DIPHENHYDRAMINE HCL 25 MG PO CAPS
25.0000 mg | ORAL_CAPSULE | Freq: Once | ORAL | Status: AC
  Administered 2020-10-17: 10:00:00 25 mg via ORAL
  Filled 2020-10-17: qty 1

## 2020-10-17 MED ORDER — SODIUM CHLORIDE 0.9% IV SOLUTION
250.0000 mL | Freq: Once | INTRAVENOUS | Status: AC
  Administered 2020-10-17: 10:00:00 250 mL via INTRAVENOUS

## 2020-10-17 MED ORDER — ACETAMINOPHEN 325 MG PO TABS
650.0000 mg | ORAL_TABLET | Freq: Once | ORAL | Status: AC
  Administered 2020-10-17: 10:00:00 650 mg via ORAL
  Filled 2020-10-17: qty 2

## 2020-10-17 MED ORDER — HEPARIN SOD (PORK) LOCK FLUSH 100 UNIT/ML IV SOLN
500.0000 [IU] | Freq: Every day | INTRAVENOUS | Status: AC | PRN
  Administered 2020-10-17: 14:00:00 500 [IU]

## 2020-10-17 NOTE — Progress Notes (Signed)
Tom Marshall tolerated blood transfusion well without complaints or incident. VSS prior to,during and after transfusion.Pt discharged via wheelchair in satisfactory condition

## 2020-10-17 NOTE — Patient Instructions (Signed)
[facility] at [facility] Discharge Instructions  Received 1 unit of blood today. Follow-up as scheduled   Thank you for choosing [facility] at [facility] to provide your oncology and hematology care.  To afford each patient quality time with our provider, please arrive at least 15 minutes before your scheduled appointment time.   If you have a lab appointment with the Cancer Center please come in thru the Main Entrance and check in at the main information desk.  You need to re-schedule your appointment should you arrive 10 or more minutes late.  We strive to give you quality time with our providers, and arriving late affects you and other patients whose appointments are after yours.  Also, if you no show three or more times for appointments you may be dismissed from the clinic at the providers discretion.     Again, thank you for choosing [facility].  Our hope is that these requests will decrease the amount of time that you wait before being seen by our physicians.       _____________________________________________________________  Should you have questions after your visit to [facility], please contact our office at [phone] and follow the prompts.  Our office hours are 8:00 a.m. and 4:30 p.m. Monday - Friday.  Please note that voicemails left after 4:00 p.m. may not be returned until the following business day.  We are closed weekends and major holidays.  You do have access to a nurse 24-7, just call the main number to the clinic [phone] and do not press any options, hold on the line and a nurse will answer the phone.    For prescription refill requests, have your pharmacy contact our office and allow 72 hours.    Due to Covid, you will need to wear a mask upon entering the hospital. If you do not have a mask, a mask will be given to you at the Main Entrance upon arrival. For doctor visits, patients may have 1  support person age 18 or older with them. For treatment visits, patients can not have anyone with them due to social distancing guidelines and our immunocompromised population.     

## 2020-10-17 NOTE — Progress Notes (Addendum)
UNMATCHED BLOOD PRODUCT NOTE  Compare the patient ID on the blood tag to the patient ID on the hospital armband and Blood Bank armband. Then confirm the unit number on the blood tag matches the unit number on the blood product.  If a discrepancy is discovered return the product to blood bank immediately.  Verified by 2 RN's TWJackson RN and BPresnell RN Blood Product Type: Packed Red Blood Cells  Unit #:[id number]  Product Code #: [id number]   Start Time: 3979  Starting Rate: 120 ml/hr  Rate increase/decreased  (if applicable):  536    ml/hr  Rate changed time (if applicable): 9223   Stop Time: 1335  Volume infused 315 ml All Other Documentation should be documented within the Blood Admin Flowsheet per policy.

## 2020-10-18 DIAGNOSIS — D509 Iron deficiency anemia, unspecified: Secondary | ICD-10-CM | POA: Diagnosis not present

## 2020-10-18 DIAGNOSIS — N178 Other acute kidney failure: Secondary | ICD-10-CM | POA: Diagnosis not present

## 2020-10-18 DIAGNOSIS — Z992 Dependence on renal dialysis: Secondary | ICD-10-CM | POA: Diagnosis not present

## 2020-10-18 DIAGNOSIS — N2581 Secondary hyperparathyroidism of renal origin: Secondary | ICD-10-CM | POA: Diagnosis not present

## 2020-10-18 DIAGNOSIS — E119 Type 2 diabetes mellitus without complications: Secondary | ICD-10-CM | POA: Diagnosis not present

## 2020-10-18 DIAGNOSIS — D649 Anemia, unspecified: Secondary | ICD-10-CM | POA: Diagnosis not present

## 2020-10-18 LAB — TYPE AND SCREEN
ABO/RH(D): A POS
Antibody Screen: POSITIVE
Unit division: 0
Unit division: 0

## 2020-10-18 LAB — BPAM RBC
Blood Product Expiration Date: 202112302359
Blood Product Expiration Date: 202112302359
Unit Type and Rh: 600
Unit Type and Rh: 600

## 2020-10-18 NOTE — Progress Notes (Signed)
[place], [place] [address]   CLINIC:  Medical Oncology/Hematology  PCP:  Asencion Noble, MD [address] / [place] [address] [phone]   REASON FOR VISIT:  Follow-up for multiple myeloma  PRIOR THERAPY:  1. Revlimid x 4 cycles from 09/05/2017 to 02/18/2018 with Revlimid maintenance. 2. Pomalyst from 06/20/2020 to 09/19/2020. 3. Darzalex x 6 cycles from 04/11/2020 to 09/05/2020. 4. Now on selenixir, bortezomib and dexamethasone.  NGS Results: Not done  CURRENT THERAPY: Velcade and selinexor  BRIEF ONCOLOGIC HISTORY:  Oncology History  Multiple myeloma ([place])  08/29/2017 Initial Diagnosis   Multiple myeloma ([place])   12/10/2017 - 03/11/2018 Chemotherapy   The patient had dexamethasone (DECADRON) 4 MG tablet, 1 of 1 cycle, Start date: --, End date: -- lenalidomide (REVLIMID) 25 MG capsule, 1 of 1 cycle, Start date: --, End date: -- bortezomib SQ (VELCADE) chemo injection 2.75 mg, 1.3 mg/m2 = 2.75 mg, Subcutaneous,  Once, 5 of 5 cycles Administration: 2.75 mg (12/10/2017), 2.75 mg (12/17/2017), 2.75 mg (12/31/2017), 2.75 mg (01/21/2018), 2.75 mg (02/18/2018), 2.75 mg (03/11/2018)  for chemotherapy treatment.    04/11/2020 - 09/05/2020 Chemotherapy   The patient had daratumumab (DARZALEX) 800 mg in sodium chloride 0.9 % 960 mL (0.8 mg/mL) chemo infusion, 8 mg/kg = 800 mg (100 % of original dose 8 mg/kg), Intravenous, Once, 1 of 1 cycle Dose modification: 8 mg/kg (original dose 8 mg/kg, Cycle 1, Reason: Provider Judgment, Comment: giving total dose 56m/kg over 2 days) daratumumab (DARZALEX) 800 mg in sodium chloride 0.9 % 460 mL (1.6 mg/mL) chemo infusion, 8 mg/kg = 800 mg (50 % of original dose 16 mg/kg), Intravenous, Once, 2 of 2 cycles Dose modification: 8 mg/kg (original dose 16 mg/kg, Cycle 1, Reason: Provider Judgment, Comment: giving 16 mg/kg over 2 days. thus 8 mg/kg in 2 divided doses.) Administration: 1,600 mg (04/19/2020), 1,600  mg (05/03/2020), 1,600 mg (05/09/2020), 800 mg (04/11/2020), 800 mg (04/12/2020) daratumumab (DARZALEX) 1,600 mg in sodium chloride 0.9 % 420 mL chemo infusion, 16 mg/kg = 1,600 mg, Intravenous, Once, 5 of 6 cycles Administration: 1,600 mg (05/16/2020), 1,600 mg (05/23/2020), 1,600 mg (05/30/2020), 1,600 mg (06/06/2020), 1,600 mg (06/13/2020), 1,600 mg (06/27/2020), 1,600 mg (07/11/2020), 1,600 mg (07/25/2020), 1,600 mg (08/08/2020), 1,600 mg (08/22/2020), 1,600 mg (09/05/2020)  for chemotherapy treatment.    09/30/2020 -  Chemotherapy   The patient had dexamethasone (DECADRON) tablet 20 mg, 20 mg, Oral,  Once, 1 of 4 cycles Administration: 20 mg (10/01/2020), 20 mg (10/07/2020), 20 mg (10/14/2020) bortezomib SQ (VELCADE) chemo injection (2.557mmL concentration) 3 mg, 1.3 mg/m2 = 3 mg, Subcutaneous,  Once, 1 of 4 cycles Administration: 3 mg (10/01/2020), 3 mg (10/07/2020), 3 mg (10/14/2020)  for chemotherapy treatment.      CANCER STAGING: Cancer Staging No matching staging information was found for the patient.  INTERVAL HISTORY:  Mr. WiCROCKETT RALLOs a [age] male with a diagnosis of multiple myeloma.  Mr. CaKowalewskiresents to the clinic today for consideration of cycle 1, day 22 of subcutaneous Velcade.  He continues on Xpovio.  He reports having a transfusion of 1 unit of packed red blood cells on Monday.  He continues to have some shortness of breath and reports having some diarrhea.  His energy level is approximately at 50% of his baseline.  His appetite is around 75% of his baseline.  His weight is stable.  He continues on dialysis.  He reports that he continues to produce a  small amount of urine.  His labs today returned showing a hemoglobin of 7.9, hematocrit of 23.8, and a platelet count of 63.  His chemistries returned with a sodium of 134, potassium 3.3, BUN 36, creatinine 5.86, calcium 8.2, glucose 200, and albumin of 3.4.  He reports that he had previously been on supplemental potassium and that his  potassium became critically high and that he had to be hospitalized.   REVIEW OF SYSTEMS:   Review of Systems  Constitutional: Positive for malaise/fatigue. Negative for chills, diaphoresis, fever and weight loss.  HENT: Negative for congestion and sore throat.   Eyes: Negative.   Respiratory: Negative for cough, hemoptysis, sputum production, shortness of breath and wheezing.   Cardiovascular: Negative for chest pain, palpitations, orthopnea, claudication and leg swelling.  Gastrointestinal: Positive for diarrhea. Negative for abdominal pain, blood in stool, constipation, melena, nausea and vomiting.  Genitourinary:       The patient is on dialysis but continues to produce a small amount of urine.  Musculoskeletal: Negative for back pain, joint pain, myalgias and neck pain.  Skin: Negative.   Neurological: Negative for dizziness, tingling, weakness and headaches.  Endo/Heme/Allergies: Negative.   Psychiatric/Behavioral: Negative.      PAST MEDICAL/SURGICAL HISTORY:  Past Medical History:  Diagnosis Date  . Cancer ([facility] [place])    multiple myeloma  . Cancer of right kidney ([facility])   . Cervical dystonia   . Diabetes mellitus without complication ([facility])   . Gout   . Hypercholesteremia    Past Surgical History:  Procedure Laterality Date  . BONE MARROW BIOPSY    . CHOLECYSTECTOMY  2007  . COLONOSCOPY WITH PROPOFOL N/A 01/16/2018   Procedure: COLONOSCOPY WITH PROPOFOL;  Surgeon: Daneil Dolin, MD;  Location: [facility];  Service: Endoscopy;  Laterality: N/A;  1:45pm  . ESOPHAGOGASTRODUODENOSCOPY (EGD) WITH PROPOFOL N/A 01/16/2018   Procedure: ESOPHAGOGASTRODUODENOSCOPY (EGD) WITH PROPOFOL;  Surgeon: Daneil Dolin, MD;  Location: [facility];  Service: Endoscopy;  Laterality: N/A;  . GIVENS CAPSULE STUDY N/A 05/12/2018   Procedure: GIVENS CAPSULE STUDY;  Surgeon: Daneil Dolin, MD;  Location: [facility];  Service: Endoscopy;  Laterality: N/A;  7:30am  . IR FLUORO GUIDE CV LINE  LEFT  09/29/2020  . IR FLUORO GUIDE CV LINE LEFT  10/05/2020  . IR US GUIDE VASC ACCESS LEFT  09/29/2020  . IR US GUIDE VASC ACCESS LEFT  10/05/2020  . NEPHRECTOMY Right 1998   cancer  . PORTACATH PLACEMENT Right 04/07/2020   Procedure: INSERTION PORT-A-CATH (attached catherter in right internal jugular);  Surgeon: Virl Cagey, MD;  Location: [facility];  Service: General;  Laterality: Right;    SOCIAL HISTORY:  Social History   Socioeconomic History  . Marital status: Single    Spouse name: Not on file  . Number of children: Not on file  . Years of education: Not on file  . Highest education level: Not on file  Occupational History  . Occupation: Marine scientist, Insurance underwriter  Tobacco Use  . Smoking status: Never Smoker  . Smokeless tobacco: Former Systems developer    Types: Secondary school teacher  . Vaping Use: Never used  Substance and Sexual Activity  . Alcohol use: No  . Drug use: No  . Sexual activity: Not Currently  Other Topics Concern  . Not on file  Social History Narrative  . Not on file   Social Determinants of Health   Financial Resource Strain: Low Risk   . Difficulty  of Paying Living Expenses: Not hard at all  Food Insecurity: No Food Insecurity  . Worried About Charity fundraiser in the Last Year: Never true  . Ran Out of Food in the Last Year: Never true  Transportation Needs: No Transportation Needs  . Lack of Transportation (Medical): No  . Lack of Transportation (Non-Medical): No  Physical Activity: Inactive  . Days of Exercise per Week: 0 days  . Minutes of Exercise per Session: 0 min  Stress: No Stress Concern Present  . Feeling of Stress : Not at all  Social Connections: Moderately Integrated  . Frequency of Communication with Friends and Family: More than three times a week  . Frequency of Social Gatherings with Friends and Family: More than three times a week  . Attends Religious Services: More than 4 times per year  . Active Member of Clubs or  Organizations: Yes  . Attends Archivist Meetings: More than 4 times per year  . Marital Status: Never married  Intimate Partner Violence: Not At Risk  . Fear of Current or Ex-Partner: No  . Emotionally Abused: No  . Physically Abused: No  . Sexually Abused: No    FAMILY HISTORY:  Family History  Problem Relation Age of Onset  . Heart failure Mother 95  . Dementia Father   . Colon cancer Neg Hx     CURRENT MEDICATIONS:  Current Outpatient Medications  Medication Sig Dispense Refill  . acetaminophen (TYLENOL) 325 MG tablet Take 650 mg by mouth every 6 (six) hours as needed.    [name] acyclovir (ZOVIRAX) 200 MG capsule Take 1 capsule (200 mg total) by mouth 2 (two) times daily. 60 capsule 0  . alfuzosin (UROXATRAL) 10 MG 24 hr tablet Take 1 tablet (10 mg total) by mouth daily with breakfast. 30 tablet 11  . allopurinol (ZYLOPRIM) 100 MG tablet Take 2 tablets (200 mg total) by mouth every morning. 60 tablet 0  . apixaban (ELIQUIS) 2.5 MG TABS tablet Take 1 tablet (2.5 mg total) by mouth daily. Start taking from tomorrow 10/06/2020    . B-D UF III MINI PEN NEEDLES 31G X 5 MM MISC   4  . cyclobenzaprine (FLEXERIL) 10 MG tablet TAKE 1 TABLET THREE TIMES DAILY AS NEEDED FOR MUSCLE SPASM (Patient taking differently: Take 10 mg by mouth 3 (three) times daily as needed.) 30 tablet 0  . dexamethasone (DECADRON) 4 MG tablet Take 5 tablets (20 mg total) by mouth once a week. 20 tablet 3  . dicyclomine (BENTYL) 10 MG capsule Take 10 mg by mouth 2 (two) times daily as needed.     . diphenoxylate-atropine (LOMOTIL) 2.5-0.025 MG tablet Take 2 tablets by mouth 4 (four) times daily as needed.     . furosemide (LASIX) 40 MG tablet Take 1.5 tablets (60 mg total) by mouth daily. 45 tablet 1  . gabapentin (NEURONTIN) 300 MG capsule Take 1 capsule (300 mg total) by mouth at bedtime.    [name] HYDROcodone-acetaminophen (NORCO/VICODIN) 5-325 MG tablet Take 1 tablet by mouth 2 (two) times daily as needed for  severe pain. 60 tablet 0  . Multiple Vitamins-Minerals (CENTRUM SILVER 50+MEN) TABS Take 1 tablet by mouth every morning.    [name] NOVOLOG FLEXPEN 100 UNIT/ML FlexPen Inject 8 Units into the skin 3 (three) times daily with meals.     . pravastatin (PRAVACHOL) 20 MG tablet Take 20 mg by mouth at bedtime.     . selinexor (XPOVIO) 40 MG TBPK Take 40  mg by mouth once a week. 4 each 0  . traZODone (DESYREL) 100 MG tablet Take 1 tablet (100 mg total) by mouth at bedtime. 30 tablet 3  . TRESIBA FLEXTOUCH 200 UNIT/ML FlexTouch Pen Inject 16 Units into the skin daily.    . TRUE METRIX BLOOD GLUCOSE TEST test strip      No current facility-administered medications for this visit.   Facility-Administered Medications Ordered in Other Visits  Medication Dose Route Frequency Provider Last Rate Last Admin  . 0.9 %  sodium chloride infusion  250 mL Intravenous Once Twana First, MD        ALLERGIES:  No Known Allergies  PHYSICAL EXAM:  Performance status (ECOG): 1 - Symptomatic but completely ambulatory  There were no vitals filed for this visit. Wt Readings from Last 3 Encounters:  10/17/20 229 lb 9.6 oz (104.1 kg)  10/14/20 227 lb 6.4 oz (103.1 kg)  10/11/20 212 lb (96.2 kg)   Physical Exam Constitutional:      General: He is not in acute distress.    Appearance: He is normal weight. He is not ill-appearing, toxic-appearing or diaphoretic.  HENT:     Head: Normocephalic and atraumatic.  Eyes:     General: No scleral icterus.       Right eye: No discharge.        Left eye: No discharge.     Conjunctiva/sclera: Conjunctivae normal.  Cardiovascular:     Rate and Rhythm: Normal rate and regular rhythm.     Heart sounds: No murmur heard. No friction rub. No gallop.   Pulmonary:     Effort: Pulmonary effort is normal. No respiratory distress.     Breath sounds: Normal breath sounds. No wheezing, rhonchi or rales.  Abdominal:     General: Bowel sounds are normal.     Tenderness: There is no  abdominal tenderness.     Comments: The patient's abdomen is protuberant.  Musculoskeletal:     Cervical back: Normal range of motion and neck supple. No rigidity or tenderness.     Right lower leg: No edema.     Left lower leg: No edema.  Lymphadenopathy:     Cervical: No cervical adenopathy.  Skin:    General: Skin is warm and dry.     Coloration: Skin is not pale.     Findings: No erythema or rash.  Neurological:     Coordination: Coordination normal.     Gait: Gait abnormal (The patient is ambulating with a walker.).  Psychiatric:        Mood and Affect: Mood normal.        Behavior: Behavior normal.        Thought Content: Thought content normal.        Judgment: Judgment normal.     LABORATORY DATA:  I have reviewed the labs as listed.  CBC Latest Ref Rng & Units 10/14/2020 10/07/2020 10/05/2020  WBC 4.0 - 10.5 K/uL 5.9 12.7(H) -  Hemoglobin 13.0 - 17.0 g/dL 7.6(L) 11.3(L) 9.4(L)  Hematocrit 39.0 - 52.0 % 23.4(L) 33.3(L) 28.7(L)  Platelets 150 - 400 K/uL 84(L) 203 -   CMP Latest Ref Rng & Units 10/14/2020 10/07/2020 10/05/2020  Glucose 70 - 99 mg/dL 109(H) 114(H) 134(H)  BUN 8 - 23 mg/dL 33(H) 30(H) 48(H)  Creatinine 0.61 - 1.24 mg/dL 6.20(H) 5.50(H) 6.89(H)  Sodium 135 - 145 mmol/L 136 136 139  Potassium 3.5 - 5.1 mmol/L 3.4(L) 3.3(L) 3.5  Chloride 98 - 111 mmol/L 96(L)  94(L) 98  CO2 22 - 32 mmol/L '28 26 24  ' Calcium 8.9 - 10.3 mg/dL 8.6(L) 9.2 8.9  Total Protein 6.5 - 8.1 g/dL 6.0(L) 6.9 -  Total Bilirubin 0.3 - 1.2 mg/dL 0.7 1.4(H) -  Alkaline Phos 38 - 126 U/L 59 85 -  AST 15 - 41 U/L 15 28 -  ALT 0 - 44 U/L 16 30 -   Lab Results  Component Value Date   LDH 132 09/28/2020   LDH 99 09/19/2020   LDH 104 09/05/2020   Lab Results  Component Value Date   TOTALPROTELP 5.5 (L) 09/28/2020   ALBUMINELP 3.1 09/28/2020   A1GS 0.2 09/28/2020   A2GS 0.8 09/28/2020   BETS 0.8 09/28/2020   GAMS 0.5 09/28/2020   MSPIKE 0.2 (H) 09/28/2020   SPEI Comment 09/28/2020     Lab Results  Component Value Date   KPAFRELGTCHN 5,360.7 (H) 09/28/2020   LAMBDASER 7.8 09/28/2020   KAPLAMBRATIO 687.27 (H) 09/28/2020    DIAGNOSTIC IMAGING:  I have independently reviewed the scans and discussed with the patient. US RENAL  Result Date: 09/28/2020 CLINICAL DATA:  69 year old male with acute renal insufficiency. Prior right nephrectomy. EXAM: RENAL / URINARY TRACT ULTRASOUND COMPLETE COMPARISON:  None. FINDINGS: Right Kidney: Nephrectomy. Left Kidney: Renal measurements: 12.2 x 6.1 x 5.3 cm = volume: 207 mL. Normal echogenicity. No hydronephrosis or shadowing stone. There is an 8.3 x 6.8 x 8.8 cm left renal inferior pole cyst. Bladder: Appears normal for degree of bladder distention. Other: There is diffuse increased liver echogenicity most commonly seen in the setting of fatty infiltration. Superimposed inflammation or fibrosis is not excluded. Clinical correlation is recommended. IMPRESSION: 1. Prior right nephrectomy. 2. Large left renal inferior pole cyst. No hydronephrosis or shadowing stone. Electronically Signed   By: Anner Crete M.D.   On: 09/28/2020 23:48   IR Fluoro Guide CV Line Left  Result Date: 10/06/2020 CLINICAL DATA:  Renal failure requiring hemodialysis, history of multiple myeloma and need for tunneled hemodialysis catheter. The patient currently has a non tunneled temporary dialysis catheter via the left internal jugular vein which was placed on 09/29/2020. He has an indwelling right jugular Port-A-Cath. EXAM: TUNNELED CENTRAL VENOUS HEMODIALYSIS CATHETER PLACEMENT WITH ULTRASOUND AND FLUOROSCOPIC GUIDANCE ANESTHESIA/SEDATION: 0.5 mg IV Versed; 25 mcg IV Fentanyl. Total Moderate Sedation Time:   30 minutes. The patient's level of consciousness and physiologic status were continuously monitored during the procedure by Radiology nursing. MEDICATIONS: 2 g IV Ancef. FLUOROSCOPY TIME:  30 seconds. PROCEDURE: The procedure, risks, benefits, and alternatives  were explained to the patient. Questions regarding the procedure were encouraged and answered. The patient understands and consents to the procedure. A timeout was performed prior to initiating the procedure. The indwelling left jugular non tunneled hemodialysis catheter was removed and manual compression applied over the exit site. The left neck and chest were then prepped with chlorhexidine in a sterile fashion, and a sterile drape was applied covering the operative field. Maximum barrier sterile technique with sterile gowns and gloves were used for the procedure. Local anesthesia was provided with 1% lidocaine. Patency of the left internal jugular vein was confirmed by ultrasound. After creating a small venotomy incision, a 21 gauge needle was advanced into the left internal jugular vein under direct, real-time ultrasound guidance. Ultrasound image documentation was performed. After securing guidewire access, an 8 Fr dilator was placed. A J-wire was kinked to measure appropriate catheter length. A Palindrome tunneled hemodialysis catheter measuring 23 cm from tip  to cuff was chosen for placement. This was tunneled in a retrograde fashion from the chest wall to the venotomy incision. At the venotomy, serial dilatation was performed and a 15 Fr peel-away sheath was placed over a guidewire. The catheter was then placed through the sheath and the sheath removed. Final catheter positioning was confirmed and documented with a fluoroscopic spot image. The catheter was aspirated, flushed with saline, and injected with appropriate volume heparin dwells. The venotomy incision was closed with subcuticular 4-0 Vicryl. Dermabond was applied to the incision. The catheter exit site was secured with 0-Prolene retention sutures. COMPLICATIONS: None.  No pneumothorax. FINDINGS: After catheter placement, the tip lies in the right atrium. The catheter aspirates normally and is ready for immediate use. IMPRESSION: Placement of  tunneled hemodialysis catheter via new access of the left internal jugular vein. The catheter tip lies in the right atrium. The catheter is ready for immediate use. Prior to tunnel catheter placement the non tunneled temporary left IJ catheter was removed. Electronically Signed   By: Aletta Edouard M.D.   On: 10/06/2020 08:17   IR Fluoro Guide CV Line Left  Result Date: 09/29/2020 INDICATION: Acute renal insufficiency. Please perform image guided placement of temporary dialysis catheter for the initiation dialysis Note, the decision was made to proceed with placement of a temporary dialysis catheter as there is hope the patient will only temporary hemodialysis per discussion with the providing nephrology service. EXAM: NON-TUNNELED CENTRAL VENOUS HEMODIALYSIS CATHETER PLACEMENT WITH ULTRASOUND AND FLUOROSCOPIC GUIDANCE COMPARISON:  None. MEDICATIONS: None FLUOROSCOPY TIME:  36 seconds (10 mGy) COMPLICATIONS: None immediate. PROCEDURE: Informed written consent was obtained from the patient after a discussion of the risks, benefits, and alternatives to treatment. Questions regarding the procedure were encouraged and answered. Given the presence of the right internal jugular approach port a catheter decision was made to place a left internal jugular approach temporary dialysis catheter As such the left neck and chest were prepped with chlorhexidine in a sterile fashion, and a sterile drape was applied covering the operative field. Maximum barrier sterile technique with sterile gowns and gloves were used for the procedure. A timeout was performed prior to the initiation of the procedure. After the overlying soft tissues were anesthetized, a small venotomy incision was created and a micropuncture kit was utilized to access the internal jugular vein. Real-time ultrasound guidance was utilized for vascular access including the acquisition of a permanent ultrasound image documenting patency of the accessed vessel. The  microwire was utilized to measure appropriate catheter length. A stiff glidewire was advanced to the level of the IVC. Under fluoroscopic guidance, the venotomy was serially dilated, ultimately allowing placement of a 20 cm temporary Mah[place]kar catheter with tip ultimately terminating within the superior aspect of the right atrium. Final catheter positioning was confirmed and documented with a spot radiographic image. The catheter aspirates and flushes normally. The catheter was flushed with appropriate volume heparin dwells. The catheter exit site was secured with a 0-Prolene retention suture. A dressing was placed. The patient tolerated the procedure well without immediate post procedural complication. IMPRESSION: Successful placement of a left internal jugular approach 20 cm temporary dialysis catheter with tip terminating with in the superior aspect of the right atrium. The catheter is ready for immediate use. PLAN: This catheter may be converted to a tunneled dialysis catheter at a later date as indicated. Electronically Signed   By: Sandi Mariscal M.D.   On: 09/29/2020 15:47   IR US Guide Vasc Access Left  Result Date: 10/06/2020 CLINICAL DATA:  Renal failure requiring hemodialysis, history of multiple myeloma and need for tunneled hemodialysis catheter. The patient currently has a non tunneled temporary dialysis catheter via the left internal jugular vein which was placed on 09/29/2020. He has an indwelling right jugular Port-A-Cath. EXAM: TUNNELED CENTRAL VENOUS HEMODIALYSIS CATHETER PLACEMENT WITH ULTRASOUND AND FLUOROSCOPIC GUIDANCE ANESTHESIA/SEDATION: 0.5 mg IV Versed; 25 mcg IV Fentanyl. Total Moderate Sedation Time:   30 minutes. The patient's level of consciousness and physiologic status were continuously monitored during the procedure by Radiology nursing. MEDICATIONS: 2 g IV Ancef. FLUOROSCOPY TIME:  30 seconds. PROCEDURE: The procedure, risks, benefits, and alternatives were explained to the  patient. Questions regarding the procedure were encouraged and answered. The patient understands and consents to the procedure. A timeout was performed prior to initiating the procedure. The indwelling left jugular non tunneled hemodialysis catheter was removed and manual compression applied over the exit site. The left neck and chest were then prepped with chlorhexidine in a sterile fashion, and a sterile drape was applied covering the operative field. Maximum barrier sterile technique with sterile gowns and gloves were used for the procedure. Local anesthesia was provided with 1% lidocaine. Patency of the left internal jugular vein was confirmed by ultrasound. After creating a small venotomy incision, a 21 gauge needle was advanced into the left internal jugular vein under direct, real-time ultrasound guidance. Ultrasound image documentation was performed. After securing guidewire access, an 8 Fr dilator was placed. A J-wire was kinked to measure appropriate catheter length. A Palindrome tunneled hemodialysis catheter measuring 23 cm from tip to cuff was chosen for placement. This was tunneled in a retrograde fashion from the chest wall to the venotomy incision. At the venotomy, serial dilatation was performed and a 15 Fr peel-away sheath was placed over a guidewire. The catheter was then placed through the sheath and the sheath removed. Final catheter positioning was confirmed and documented with a fluoroscopic spot image. The catheter was aspirated, flushed with saline, and injected with appropriate volume heparin dwells. The venotomy incision was closed with subcuticular 4-0 Vicryl. Dermabond was applied to the incision. The catheter exit site was secured with 0-Prolene retention sutures. COMPLICATIONS: None.  No pneumothorax. FINDINGS: After catheter placement, the tip lies in the right atrium. The catheter aspirates normally and is ready for immediate use. IMPRESSION: Placement of tunneled hemodialysis  catheter via new access of the left internal jugular vein. The catheter tip lies in the right atrium. The catheter is ready for immediate use. Prior to tunnel catheter placement the non tunneled temporary left IJ catheter was removed. Electronically Signed   By: Aletta Edouard M.D.   On: 10/06/2020 08:17   IR US Guide Vasc Access Left  Result Date: 09/29/2020 INDICATION: Acute renal insufficiency. Please perform image guided placement of temporary dialysis catheter for the initiation dialysis Note, the decision was made to proceed with placement of a temporary dialysis catheter as there is hope the patient will only temporary hemodialysis per discussion with the providing nephrology service. EXAM: NON-TUNNELED CENTRAL VENOUS HEMODIALYSIS CATHETER PLACEMENT WITH ULTRASOUND AND FLUOROSCOPIC GUIDANCE COMPARISON:  None. MEDICATIONS: None FLUOROSCOPY TIME:  36 seconds (10 mGy) COMPLICATIONS: None immediate. PROCEDURE: Informed written consent was obtained from the patient after a discussion of the risks, benefits, and alternatives to treatment. Questions regarding the procedure were encouraged and answered. Given the presence of the right internal jugular approach port a catheter decision was made to place a left internal jugular approach temporary dialysis catheter  As such the left neck and chest were prepped with chlorhexidine in a sterile fashion, and a sterile drape was applied covering the operative field. Maximum barrier sterile technique with sterile gowns and gloves were used for the procedure. A timeout was performed prior to the initiation of the procedure. After the overlying soft tissues were anesthetized, a small venotomy incision was created and a micropuncture kit was utilized to access the internal jugular vein. Real-time ultrasound guidance was utilized for vascular access including the acquisition of a permanent ultrasound image documenting patency of the accessed vessel. The microwire was utilized  to measure appropriate catheter length. A stiff glidewire was advanced to the level of the IVC. Under fluoroscopic guidance, the venotomy was serially dilated, ultimately allowing placement of a 20 cm temporary Mahurkar catheter with tip ultimately terminating within the superior aspect of the right atrium. Final catheter positioning was confirmed and documented with a spot radiographic image. The catheter aspirates and flushes normally. The catheter was flushed with appropriate volume heparin dwells. The catheter exit site was secured with a 0-Prolene retention suture. A dressing was placed. The patient tolerated the procedure well without immediate post procedural complication. IMPRESSION: Successful placement of a left internal jugular approach 20 cm temporary dialysis catheter with tip terminating with in the superior aspect of the right atrium. The catheter is ready for immediate use. PLAN: This catheter may be converted to a tunneled dialysis catheter at a later date as indicated. Electronically Signed   By: Sandi Mariscal M.D.   On: 09/29/2020 15:47   DG Chest Port 1 View  Result Date: 09/28/2020 CLINICAL DATA:  68 year old male with shortness of breath. EXAM: PORTABLE CHEST 1 VIEW COMPARISON:  Chest radiograph dated 05/18/2020. FINDINGS: Right-sided Port-A-Cath in similar position. There is diffuse interstitial coarsening and mild chronic bronchitic changes. There are bibasilar atelectasis/scarring. No focal consolidation, pleural effusion, pneumothorax. Stable cardiac silhouette. No acute osseous pathology. Old rib fractures. IMPRESSION: No acute cardiopulmonary process. Electronically Signed   By: Anner Crete M.D.   On: 09/28/2020 18:30     ASSESSMENT:   1. IgG kappa multiple myeloma: -4 cycles of RVD from 08/30/2017 through 02/18/2018. -Declined bone marrow transplant. Received maintenance Revlimid 2.5 mg 3 weeks on 1 week off. -Bone marrow biopsy on 02/23/2020 shows 21% plasma cells.  Occasional sections had up to 75% myeloma cells. -Chromosome analysis was normal. FISH panel was positive for gain of 1 q., monosomy 13, del 17 P, t(14;20) -PET scan showed new left frontal destructive lesion. Numerous areas of lytic changes in the spine. Lucent areas in T6 with loss of height at T5. Numerous lytic changes throughout the spine noted along with pedicle and lamina and transverse process of T8 vertebral body. Profound hypermetabolic activity within the ribs bilaterally. -Daratumumab was started on 04/11/2020.Pomalidomide 2 mg 3 weeks on 1 week off started on 06/20/2020. - Progression noted with increased Kappa light chains and k/L ratio, hence switched to selenixir/velcade/dex.  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 -Completed XRT on 04/05/2020. -MRI thoracic spine on 07/26/2020 showed significant overall improvement in multiple myeloma with much of the bone marrow has returned to normal fatty marrow.  Ventral epidural tumor on the right of T5 near completely resolved.  Proximal right T7 rib lesion  also resolved.  3. 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 He is on Eliquis.   PLAN:  1. IgG kappa multiple myeloma:  -We have reviewed myeloma labs from  09/29/2020, k/L ratio of 687.27 and kappa free light chain of 5360. -He was supposed to start  Selenixor, velcade and dex but got admitted to the hospital. selinexor low-dose 40 mg weekly and titrated up as tolerated.  Bortezomib is 1.3 mg per metered square once weekly for 4 weeks every 35 days.  He will continue dexamethasone 20 mg once weekly. 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  We will closely monitor.  If there is any worsening, will cut back on  dose of bortezomib. -Dr Raliegh Ip discussed side effects of selinexor in detail including but not limited to GI toxicity, fluid retention, cytopenias among others.   -C1D1 of selenixor/velcade/dex on 10/07/2020 -Cycle 1, day 22 of subcu Velcade.  The patient continues on Xpovio 40 mg once weekly. -He will return for labs and treatment only and 1 week.  He will see Dr. Delton Coombes got to in follow-up with labs in 2 weeks.  2. CKD, stage V, Dialysis Tue/thu/sat  3. Left leg DVT: -Continue Eliquis 2.5 mg daily.  No bleeding issues.  4.Diabetes: -Continue Tresiba and NovoLog.  Continue follow-up with Dr. Willey Blade.  5. Anemia -Mr. Gavin is status post a transfusion on 10/17/2020. -He will return for a crossmatch on 10/24/2020 with plans for a transfusion of 1 unit of packed red blood cells on 10/26/2020.  6.  Hypokalemia -A chemistry panel returned today with a potassium of 3.3. -No supplemental potassium was given based on the patient's history of recent hospitalization with hyper kalemia by his report.  He was however told to increase his intake of bananas and orange juice. -A chemistry panel will be repeated on his return in 2 weeks.    Orders placed this encounter:  No orders of the defined types were placed in this encounter.  Lyndon Code Maclin Guerrette PA-C

## 2020-10-19 ENCOUNTER — Other Ambulatory Visit: Payer: Self-pay

## 2020-10-19 ENCOUNTER — Inpatient Hospital Stay (HOSPITAL_COMMUNITY): Payer: Medicare Other

## 2020-10-19 ENCOUNTER — Inpatient Hospital Stay (HOSPITAL_BASED_OUTPATIENT_CLINIC_OR_DEPARTMENT_OTHER): Payer: Medicare Other | Admitting: Medical

## 2020-10-19 VITALS — BP 124/56 | HR 93 | Temp 97.2°F | Resp 19 | Wt 232.0 lb

## 2020-10-19 DIAGNOSIS — Z79899 Other long term (current) drug therapy: Secondary | ICD-10-CM | POA: Diagnosis not present

## 2020-10-19 DIAGNOSIS — C9000 Multiple myeloma not having achieved remission: Secondary | ICD-10-CM

## 2020-10-19 DIAGNOSIS — D630 Anemia in neoplastic disease: Secondary | ICD-10-CM

## 2020-10-19 DIAGNOSIS — E876 Hypokalemia: Secondary | ICD-10-CM | POA: Diagnosis not present

## 2020-10-19 DIAGNOSIS — D649 Anemia, unspecified: Secondary | ICD-10-CM | POA: Diagnosis not present

## 2020-10-19 DIAGNOSIS — N186 End stage renal disease: Secondary | ICD-10-CM

## 2020-10-19 DIAGNOSIS — Z7901 Long term (current) use of anticoagulants: Secondary | ICD-10-CM | POA: Diagnosis not present

## 2020-10-19 DIAGNOSIS — Z86718 Personal history of other venous thrombosis and embolism: Secondary | ICD-10-CM | POA: Diagnosis not present

## 2020-10-19 DIAGNOSIS — Z992 Dependence on renal dialysis: Secondary | ICD-10-CM | POA: Diagnosis not present

## 2020-10-19 LAB — CBC WITH DIFFERENTIAL/PLATELET
Basophils Absolute: 0 10*3/uL (ref 0.0–0.1)
Basophils Relative: 0 %
Eosinophils Absolute: 0.1 10*3/uL (ref 0.0–0.5)
Eosinophils Relative: 2 %
HCT: 23.8 % — ABNORMAL LOW (ref 39.0–52.0)
Hemoglobin: 7.9 g/dL — ABNORMAL LOW (ref 13.0–17.0)
Lymphocytes Relative: 10 %
Lymphs Abs: 0.6 10*3/uL — ABNORMAL LOW (ref 0.7–4.0)
MCH: 32.4 pg (ref 26.0–34.0)
MCHC: 33.2 g/dL (ref 30.0–36.0)
MCV: 97.5 fL (ref 80.0–100.0)
Metamyelocytes Relative: 1 %
Monocytes Absolute: 0.4 10*3/uL (ref 0.1–1.0)
Monocytes Relative: 6 %
Neutro Abs: 4.9 10*3/uL (ref 1.7–7.7)
Neutrophils Relative %: 81 %
Platelets: 63 10*3/uL — ABNORMAL LOW (ref 150–400)
RBC: 2.44 MIL/uL — ABNORMAL LOW (ref 4.22–5.81)
RDW: 17.6 % — ABNORMAL HIGH (ref 11.5–15.5)
WBC: 6.1 10*3/uL (ref 4.0–10.5)
nRBC: 0 % (ref 0.0–0.2)

## 2020-10-19 LAB — COMPREHENSIVE METABOLIC PANEL
ALT: 21 U/L (ref 0–44)
AST: 16 U/L (ref 15–41)
Albumin: 3.4 g/dL — ABNORMAL LOW (ref 3.5–5.0)
Alkaline Phosphatase: 62 U/L (ref 38–126)
Anion gap: 14 (ref 5–15)
BUN: 36 mg/dL — ABNORMAL HIGH (ref 8–23)
CO2: 27 mmol/L (ref 22–32)
Calcium: 8.2 mg/dL — ABNORMAL LOW (ref 8.9–10.3)
Chloride: 93 mmol/L — ABNORMAL LOW (ref 98–111)
Creatinine, Ser: 5.86 mg/dL — ABNORMAL HIGH (ref 0.61–1.24)
GFR, Estimated: 10 mL/min — ABNORMAL LOW (ref >60)
Glucose, Bld: 200 mg/dL — ABNORMAL HIGH (ref 70–99)
Potassium: 3.3 mmol/L — ABNORMAL LOW (ref 3.5–5.1)
Sodium: 134 mmol/L — ABNORMAL LOW (ref 135–145)
Total Bilirubin: 0.7 mg/dL (ref 0.3–1.2)
Total Protein: 5.9 g/dL — ABNORMAL LOW (ref 6.5–8.1)

## 2020-10-19 MED ORDER — DEXAMETHASONE 4 MG PO TABS
20.0000 mg | ORAL_TABLET | Freq: Once | ORAL | Status: AC
  Administered 2020-10-19: 11:00:00 20 mg via ORAL
  Filled 2020-10-19: qty 5

## 2020-10-19 MED ORDER — BORTEZOMIB CHEMO SQ INJECTION 3.5 MG (2.5MG/ML)
1.3000 mg/m2 | Freq: Once | INTRAMUSCULAR | Status: AC
  Administered 2020-10-19: 11:00:00 3 mg via SUBCUTANEOUS
  Filled 2020-10-19: qty 1.2

## 2020-10-19 NOTE — Patient Instructions (Signed)
[facility] Discharge Instructions for Patients Receiving Chemotherapy   Beginning January 23rd 2017 lab work for the Cancer Center will be done in the  Main lab at [facility] on 1st floor. If you have a lab appointment with the Cancer Center please come in thru the  Main Entrance and check in at the main information desk   Today you received the following chemotherapy agents Velcade injection. Follow-up as scheduled  To help prevent nausea and vomiting after your treatment, we encourage you to take your nausea medication   If you develop nausea and vomiting, or diarrhea that is not controlled by your medication, call the clinic.  The clinic phone number is [phone]. Office hours are Monday-Friday 8:30am-5:00pm.  BELOW ARE SYMPTOMS THAT SHOULD BE REPORTED IMMEDIATELY:  *FEVER GREATER THAN 101.0 F  *CHILLS WITH OR WITHOUT FEVER  NAUSEA AND VOMITING THAT IS NOT CONTROLLED WITH YOUR NAUSEA MEDICATION  *UNUSUAL SHORTNESS OF BREATH  *UNUSUAL BRUISING OR BLEEDING  TENDERNESS IN MOUTH AND THROAT WITH OR WITHOUT PRESENCE OF ULCERS  *URINARY PROBLEMS  *BOWEL PROBLEMS  UNUSUAL RASH Items with * indicate a potential emergency and should be followed up as soon as possible. If you have an emergency after office hours please contact your primary care physician or go to the nearest emergency department.  Please call the clinic during office hours if you have any questions or concerns.   You may also contact the Patient Navigator at [phone] should you have any questions or need assistance in obtaining follow up care.      Resources For Cancer Patients and their Caregivers ? American Cancer Society: Can assist with transportation, wigs, general needs, runs Look Good Feel Better.        1-888-227-6333 ? Cancer Care: Provides financial assistance, online support groups, medication/co-pay assistance.  1-800-813-HOPE (4673) ? [facility] Assists [place] cancer patients and their families through emotional , educational and financial support.  336-427-4357 ? [place] DSS Where to apply for food stamps, Medicaid and utility assistance. 336-342-1394 ? [facility]: Transportation to medical appointments. 336-347-2287 ? Social Security Administration: May apply for disability if have a Stage IV cancer. 336-342-7796 1-800-772-1213 ? [place] [facility]ging, Disability and Transit Services: Assists with nutrition, care and transit needs. 336-349-2343         

## 2020-10-19 NOTE — Progress Notes (Signed)
Patients port flushed without difficulty.  Good blood return noted with no bruising or swelling noted at site.  Band aid applied.  VSS with discharge and left in satisfactory condition with no s/s of distress noted.   

## 2020-10-19 NOTE — Progress Notes (Signed)
1013 Lab results,including Hgb 7.9, reviewed with and pt seen by Rogers Seeds PA and pt approved for Velcade injection today per PA                                                                                                    Tom Marshall tolerated Velcade injection well without complaints or incident. VSS Pt discharged via wheelchair in satisfactory condition

## 2020-10-19 NOTE — Progress Notes (Signed)
OK to receive Velcade today.  Sandi Mealy, MHS, PA-C Physician Assistant

## 2020-10-20 DIAGNOSIS — Z992 Dependence on renal dialysis: Secondary | ICD-10-CM | POA: Diagnosis not present

## 2020-10-20 DIAGNOSIS — D509 Iron deficiency anemia, unspecified: Secondary | ICD-10-CM | POA: Diagnosis not present

## 2020-10-20 DIAGNOSIS — N2581 Secondary hyperparathyroidism of renal origin: Secondary | ICD-10-CM | POA: Diagnosis not present

## 2020-10-20 DIAGNOSIS — N178 Other acute kidney failure: Secondary | ICD-10-CM | POA: Diagnosis not present

## 2020-10-20 DIAGNOSIS — E119 Type 2 diabetes mellitus without complications: Secondary | ICD-10-CM | POA: Diagnosis not present

## 2020-10-20 DIAGNOSIS — D649 Anemia, unspecified: Secondary | ICD-10-CM | POA: Diagnosis not present

## 2020-10-23 DIAGNOSIS — E119 Type 2 diabetes mellitus without complications: Secondary | ICD-10-CM | POA: Diagnosis not present

## 2020-10-23 DIAGNOSIS — N2581 Secondary hyperparathyroidism of renal origin: Secondary | ICD-10-CM | POA: Diagnosis not present

## 2020-10-23 DIAGNOSIS — N178 Other acute kidney failure: Secondary | ICD-10-CM | POA: Diagnosis not present

## 2020-10-23 DIAGNOSIS — D509 Iron deficiency anemia, unspecified: Secondary | ICD-10-CM | POA: Diagnosis not present

## 2020-10-23 DIAGNOSIS — Z992 Dependence on renal dialysis: Secondary | ICD-10-CM | POA: Diagnosis not present

## 2020-10-23 DIAGNOSIS — D649 Anemia, unspecified: Secondary | ICD-10-CM | POA: Diagnosis not present

## 2020-10-23 DIAGNOSIS — N179 Acute kidney failure, unspecified: Secondary | ICD-10-CM | POA: Diagnosis not present

## 2020-10-24 ENCOUNTER — Other Ambulatory Visit: Payer: Self-pay

## 2020-10-24 ENCOUNTER — Inpatient Hospital Stay (HOSPITAL_COMMUNITY): Payer: Medicare Other

## 2020-10-24 DIAGNOSIS — Z7901 Long term (current) use of anticoagulants: Secondary | ICD-10-CM | POA: Diagnosis not present

## 2020-10-24 DIAGNOSIS — Z86718 Personal history of other venous thrombosis and embolism: Secondary | ICD-10-CM | POA: Diagnosis not present

## 2020-10-24 DIAGNOSIS — D630 Anemia in neoplastic disease: Secondary | ICD-10-CM

## 2020-10-24 DIAGNOSIS — C9000 Multiple myeloma not having achieved remission: Secondary | ICD-10-CM | POA: Diagnosis not present

## 2020-10-24 DIAGNOSIS — Z79899 Other long term (current) drug therapy: Secondary | ICD-10-CM | POA: Diagnosis not present

## 2020-10-24 DIAGNOSIS — D649 Anemia, unspecified: Secondary | ICD-10-CM

## 2020-10-24 LAB — CBC WITH DIFFERENTIAL/PLATELET
Abs Immature Granulocytes: 0.1 10*3/uL — ABNORMAL HIGH (ref 0.00–0.07)
Basophils Absolute: 0 10*3/uL (ref 0.0–0.1)
Basophils Relative: 0 %
Eosinophils Absolute: 0 10*3/uL (ref 0.0–0.5)
Eosinophils Relative: 0 %
HCT: 23.5 % — ABNORMAL LOW (ref 39.0–52.0)
Hemoglobin: 7.6 g/dL — ABNORMAL LOW (ref 13.0–17.0)
Immature Granulocytes: 3 %
Lymphocytes Relative: 15 %
Lymphs Abs: 0.6 10*3/uL — ABNORMAL LOW (ref 0.7–4.0)
MCH: 32.3 pg (ref 26.0–34.0)
MCHC: 32.3 g/dL (ref 30.0–36.0)
MCV: 100 fL (ref 80.0–100.0)
Monocytes Absolute: 0.4 10*3/uL (ref 0.1–1.0)
Monocytes Relative: 9 %
Neutro Abs: 2.9 10*3/uL (ref 1.7–7.7)
Neutrophils Relative %: 73 %
Platelets: 39 10*3/uL — ABNORMAL LOW (ref 150–400)
RBC: 2.35 MIL/uL — ABNORMAL LOW (ref 4.22–5.81)
RDW: 17.1 % — ABNORMAL HIGH (ref 11.5–15.5)
WBC: 4 10*3/uL (ref 4.0–10.5)
nRBC: 1 % — ABNORMAL HIGH (ref 0.0–0.2)

## 2020-10-24 LAB — COMPREHENSIVE METABOLIC PANEL
ALT: 23 U/L (ref 0–44)
AST: 16 U/L (ref 15–41)
Albumin: 3.6 g/dL (ref 3.5–5.0)
Alkaline Phosphatase: 53 U/L (ref 38–126)
Anion gap: 13 (ref 5–15)
BUN: 33 mg/dL — ABNORMAL HIGH (ref 8–23)
CO2: 29 mmol/L (ref 22–32)
Calcium: 8.9 mg/dL (ref 8.9–10.3)
Chloride: 94 mmol/L — ABNORMAL LOW (ref 98–111)
Creatinine, Ser: 5.76 mg/dL — ABNORMAL HIGH (ref 0.61–1.24)
GFR, Estimated: 10 mL/min — ABNORMAL LOW (ref >60)
Glucose, Bld: 115 mg/dL — ABNORMAL HIGH (ref 70–99)
Potassium: 3.6 mmol/L (ref 3.5–5.1)
Sodium: 136 mmol/L (ref 135–145)
Total Bilirubin: 0.5 mg/dL (ref 0.3–1.2)
Total Protein: 6 g/dL — ABNORMAL LOW (ref 6.5–8.1)

## 2020-10-24 LAB — SAMPLE TO BLOOD BANK

## 2020-10-24 NOTE — Progress Notes (Signed)
Patients port flushed without difficulty.  Good blood return noted with no bruising or swelling noted at site.  Band aid applied.  VSS with discharge and left in satisfactory condition with no s/s of distress noted.   

## 2020-10-25 DIAGNOSIS — D509 Iron deficiency anemia, unspecified: Secondary | ICD-10-CM | POA: Diagnosis not present

## 2020-10-25 DIAGNOSIS — E119 Type 2 diabetes mellitus without complications: Secondary | ICD-10-CM | POA: Diagnosis not present

## 2020-10-25 DIAGNOSIS — Z992 Dependence on renal dialysis: Secondary | ICD-10-CM | POA: Diagnosis not present

## 2020-10-25 DIAGNOSIS — N2581 Secondary hyperparathyroidism of renal origin: Secondary | ICD-10-CM | POA: Diagnosis not present

## 2020-10-25 DIAGNOSIS — N178 Other acute kidney failure: Secondary | ICD-10-CM | POA: Diagnosis not present

## 2020-10-25 DIAGNOSIS — D649 Anemia, unspecified: Secondary | ICD-10-CM | POA: Diagnosis not present

## 2020-10-25 LAB — PREPARE RBC (CROSSMATCH)

## 2020-10-26 ENCOUNTER — Inpatient Hospital Stay (HOSPITAL_COMMUNITY): Payer: Medicare Other

## 2020-10-26 ENCOUNTER — Encounter (HOSPITAL_COMMUNITY): Payer: Self-pay

## 2020-10-26 ENCOUNTER — Other Ambulatory Visit: Payer: Self-pay

## 2020-10-26 VITALS — BP 121/65 | HR 83 | Temp 96.9°F | Resp 19

## 2020-10-26 DIAGNOSIS — Z79899 Other long term (current) drug therapy: Secondary | ICD-10-CM | POA: Diagnosis not present

## 2020-10-26 DIAGNOSIS — C9000 Multiple myeloma not having achieved remission: Secondary | ICD-10-CM

## 2020-10-26 DIAGNOSIS — Z7901 Long term (current) use of anticoagulants: Secondary | ICD-10-CM | POA: Diagnosis not present

## 2020-10-26 DIAGNOSIS — Z86718 Personal history of other venous thrombosis and embolism: Secondary | ICD-10-CM | POA: Diagnosis not present

## 2020-10-26 DIAGNOSIS — D649 Anemia, unspecified: Secondary | ICD-10-CM

## 2020-10-26 MED ORDER — BORTEZOMIB CHEMO SQ INJECTION 3.5 MG (2.5MG/ML)
1.3000 mg/m2 | Freq: Once | INTRAMUSCULAR | Status: AC
  Administered 2020-10-26: 11:00:00 3 mg via SUBCUTANEOUS
  Filled 2020-10-26: qty 1.2

## 2020-10-26 MED ORDER — SODIUM CHLORIDE 0.9% FLUSH
3.0000 mL | INTRAVENOUS | Status: AC | PRN
  Administered 2020-10-26: 13:00:00 3 mL

## 2020-10-26 MED ORDER — DIPHENHYDRAMINE HCL 25 MG PO CAPS
25.0000 mg | ORAL_CAPSULE | Freq: Once | ORAL | Status: AC
  Administered 2020-10-26: 10:00:00 25 mg via ORAL
  Filled 2020-10-26: qty 1

## 2020-10-26 MED ORDER — DEXAMETHASONE 4 MG PO TABS
20.0000 mg | ORAL_TABLET | Freq: Once | ORAL | Status: AC
  Administered 2020-10-26: 10:00:00 20 mg via ORAL
  Filled 2020-10-26: qty 5

## 2020-10-26 MED ORDER — SODIUM CHLORIDE 0.9 % IV SOLN: INTRAVENOUS | Status: DC

## 2020-10-26 MED ORDER — ACETAMINOPHEN 325 MG PO TABS
650.0000 mg | ORAL_TABLET | Freq: Once | ORAL | Status: AC
  Administered 2020-10-26: 10:00:00 650 mg via ORAL
  Filled 2020-10-26: qty 2

## 2020-10-26 MED ORDER — HEPARIN SOD (PORK) LOCK FLUSH 100 UNIT/ML IV SOLN
250.0000 [IU] | INTRAVENOUS | Status: AC | PRN
  Administered 2020-10-26: 13:00:00 250 [IU]

## 2020-10-26 NOTE — Progress Notes (Signed)
Velcade injection and 1 UPRBC given today per MD orders.  Tolerated infusion and injection without adverse affects.  Injection site WNL.  Vital signs stable.  No complaints at this time.  Discharge from clinic ambulatory in stable condition.  Alert and oriented X 3.  Follow up with [facility] as scheduled.

## 2020-10-26 NOTE — Progress Notes (Signed)
Patient to receive one unit of blood today for hgb 7.6 on 10/24/2020.  Stated prn diarrhea and using lomotil for control  Has noted bright red blood on toilet paper prn but no dripping or clots in toilet bowl.  Dr. Delton Coombes notified.   On dialysis at [facility] in [place].  No s/s of distress noted.

## 2020-10-26 NOTE — Patient Instructions (Signed)
[facility] Discharge Instructions for Patients Receiving Chemotherapy  Today you received the following chemotherapy agents   To help prevent nausea and vomiting after your treatment, we encourage you to take your nausea medication   If you develop nausea and vomiting that is not controlled by your nausea medication, call the clinic.   BELOW ARE SYMPTOMS THAT SHOULD BE REPORTED IMMEDIATELY:  *FEVER GREATER THAN 100.5 F  *CHILLS WITH OR WITHOUT FEVER  NAUSEA AND VOMITING THAT IS NOT CONTROLLED WITH YOUR NAUSEA MEDICATION  *UNUSUAL SHORTNESS OF BREATH  *UNUSUAL BRUISING OR BLEEDING  TENDERNESS IN MOUTH AND THROAT WITH OR WITHOUT PRESENCE OF ULCERS  *URINARY PROBLEMS  *BOWEL PROBLEMS  UNUSUAL RASH Items with * indicate a potential emergency and should be followed up as soon as possible.  Feel free to call the clinic should you have any questions or concerns. The clinic phone number is [phone].  Please show the CHEMO ALERT CARD at check-in to the Emergency Department and triage nurse.   

## 2020-10-27 ENCOUNTER — Other Ambulatory Visit (HOSPITAL_COMMUNITY): Payer: Self-pay

## 2020-10-27 DIAGNOSIS — D509 Iron deficiency anemia, unspecified: Secondary | ICD-10-CM | POA: Diagnosis not present

## 2020-10-27 DIAGNOSIS — C9000 Multiple myeloma not having achieved remission: Secondary | ICD-10-CM

## 2020-10-27 DIAGNOSIS — Z992 Dependence on renal dialysis: Secondary | ICD-10-CM | POA: Diagnosis not present

## 2020-10-27 DIAGNOSIS — D649 Anemia, unspecified: Secondary | ICD-10-CM | POA: Diagnosis not present

## 2020-10-27 DIAGNOSIS — N178 Other acute kidney failure: Secondary | ICD-10-CM | POA: Diagnosis not present

## 2020-10-27 DIAGNOSIS — N2581 Secondary hyperparathyroidism of renal origin: Secondary | ICD-10-CM | POA: Diagnosis not present

## 2020-10-27 DIAGNOSIS — E119 Type 2 diabetes mellitus without complications: Secondary | ICD-10-CM | POA: Diagnosis not present

## 2020-10-27 MED ORDER — ACYCLOVIR 200 MG PO CAPS: 200.0000 mg | ORAL_CAPSULE | Freq: Two times a day (BID) | ORAL | 3 refills | Status: AC

## 2020-10-28 DIAGNOSIS — C9000 Multiple myeloma not having achieved remission: Secondary | ICD-10-CM | POA: Diagnosis not present

## 2020-10-28 DIAGNOSIS — R7309 Other abnormal glucose: Secondary | ICD-10-CM | POA: Diagnosis not present

## 2020-10-28 DIAGNOSIS — E1122 Type 2 diabetes mellitus with diabetic chronic kidney disease: Secondary | ICD-10-CM | POA: Diagnosis not present

## 2020-10-28 DIAGNOSIS — N186 End stage renal disease: Secondary | ICD-10-CM | POA: Diagnosis not present

## 2020-10-28 LAB — TYPE AND SCREEN
ABO/RH(D): A POS
Antibody Screen: POSITIVE
Unit division: 0
Unit division: 0

## 2020-10-28 LAB — BPAM RBC
Blood Product Expiration Date: 202201082359
Blood Product Expiration Date: 202201082359
ISSUE DATE / TIME: 202112291039
ISSUE DATE / TIME: 202112291039
Unit Type and Rh: 5100
Unit Type and Rh: 5100

## 2020-10-30 DIAGNOSIS — Z23 Encounter for immunization: Secondary | ICD-10-CM | POA: Diagnosis not present

## 2020-10-30 DIAGNOSIS — Z992 Dependence on renal dialysis: Secondary | ICD-10-CM | POA: Diagnosis not present

## 2020-10-30 DIAGNOSIS — N2581 Secondary hyperparathyroidism of renal origin: Secondary | ICD-10-CM | POA: Diagnosis not present

## 2020-10-30 DIAGNOSIS — N178 Other acute kidney failure: Secondary | ICD-10-CM | POA: Diagnosis not present

## 2020-10-30 DIAGNOSIS — D649 Anemia, unspecified: Secondary | ICD-10-CM | POA: Diagnosis not present

## 2020-10-30 DIAGNOSIS — D509 Iron deficiency anemia, unspecified: Secondary | ICD-10-CM | POA: Diagnosis not present

## 2020-11-01 DIAGNOSIS — Z992 Dependence on renal dialysis: Secondary | ICD-10-CM | POA: Diagnosis not present

## 2020-11-01 DIAGNOSIS — D649 Anemia, unspecified: Secondary | ICD-10-CM | POA: Diagnosis not present

## 2020-11-01 DIAGNOSIS — N2581 Secondary hyperparathyroidism of renal origin: Secondary | ICD-10-CM | POA: Diagnosis not present

## 2020-11-01 DIAGNOSIS — N179 Acute kidney failure, unspecified: Secondary | ICD-10-CM | POA: Diagnosis not present

## 2020-11-01 DIAGNOSIS — N178 Other acute kidney failure: Secondary | ICD-10-CM | POA: Diagnosis not present

## 2020-11-01 DIAGNOSIS — Z23 Encounter for immunization: Secondary | ICD-10-CM | POA: Diagnosis not present

## 2020-11-01 DIAGNOSIS — D509 Iron deficiency anemia, unspecified: Secondary | ICD-10-CM | POA: Diagnosis not present

## 2020-11-02 ENCOUNTER — Other Ambulatory Visit: Payer: Self-pay

## 2020-11-02 ENCOUNTER — Inpatient Hospital Stay (HOSPITAL_COMMUNITY): Payer: Medicare Other | Attending: Hematology

## 2020-11-02 ENCOUNTER — Inpatient Hospital Stay (HOSPITAL_BASED_OUTPATIENT_CLINIC_OR_DEPARTMENT_OTHER): Payer: Medicare Other | Admitting: Hematology

## 2020-11-02 ENCOUNTER — Inpatient Hospital Stay (HOSPITAL_COMMUNITY): Payer: Medicare Other

## 2020-11-02 VITALS — BP 111/52 | HR 111 | Temp 97.5°F | Resp 20 | Wt 229.8 lb

## 2020-11-02 DIAGNOSIS — Z86718 Personal history of other venous thrombosis and embolism: Secondary | ICD-10-CM | POA: Diagnosis not present

## 2020-11-02 DIAGNOSIS — D696 Thrombocytopenia, unspecified: Secondary | ICD-10-CM | POA: Diagnosis not present

## 2020-11-02 DIAGNOSIS — Z7901 Long term (current) use of anticoagulants: Secondary | ICD-10-CM | POA: Insufficient documentation

## 2020-11-02 DIAGNOSIS — Z79899 Other long term (current) drug therapy: Secondary | ICD-10-CM | POA: Diagnosis not present

## 2020-11-02 DIAGNOSIS — E119 Type 2 diabetes mellitus without complications: Secondary | ICD-10-CM | POA: Insufficient documentation

## 2020-11-02 DIAGNOSIS — G4700 Insomnia, unspecified: Secondary | ICD-10-CM | POA: Insufficient documentation

## 2020-11-02 DIAGNOSIS — D630 Anemia in neoplastic disease: Secondary | ICD-10-CM

## 2020-11-02 DIAGNOSIS — C9000 Multiple myeloma not having achieved remission: Secondary | ICD-10-CM

## 2020-11-02 DIAGNOSIS — Z5112 Encounter for antineoplastic immunotherapy: Secondary | ICD-10-CM | POA: Diagnosis not present

## 2020-11-02 DIAGNOSIS — E876 Hypokalemia: Secondary | ICD-10-CM

## 2020-11-02 DIAGNOSIS — N186 End stage renal disease: Secondary | ICD-10-CM

## 2020-11-02 DIAGNOSIS — Z992 Dependence on renal dialysis: Secondary | ICD-10-CM

## 2020-11-02 DIAGNOSIS — D649 Anemia, unspecified: Secondary | ICD-10-CM

## 2020-11-02 LAB — COMPREHENSIVE METABOLIC PANEL
ALT: 25 U/L (ref 0–44)
AST: 17 U/L (ref 15–41)
Albumin: 3.5 g/dL (ref 3.5–5.0)
Alkaline Phosphatase: 60 U/L (ref 38–126)
Anion gap: 12 (ref 5–15)
BUN: 28 mg/dL — ABNORMAL HIGH (ref 8–23)
CO2: 28 mmol/L (ref 22–32)
Calcium: 9 mg/dL (ref 8.9–10.3)
Chloride: 99 mmol/L (ref 98–111)
Creatinine, Ser: 5.31 mg/dL — ABNORMAL HIGH (ref 0.61–1.24)
GFR, Estimated: 11 mL/min — ABNORMAL LOW (ref >60)
Glucose, Bld: 144 mg/dL — ABNORMAL HIGH (ref 70–99)
Potassium: 4.5 mmol/L (ref 3.5–5.1)
Sodium: 139 mmol/L (ref 135–145)
Total Bilirubin: 0.7 mg/dL (ref 0.3–1.2)
Total Protein: 6.4 g/dL — ABNORMAL LOW (ref 6.5–8.1)

## 2020-11-02 LAB — CBC WITH DIFFERENTIAL/PLATELET
Abs Immature Granulocytes: 0.02 10*3/uL (ref 0.00–0.07)
Basophils Absolute: 0 10*3/uL (ref 0.0–0.1)
Basophils Relative: 0 %
Eosinophils Absolute: 0 10*3/uL (ref 0.0–0.5)
Eosinophils Relative: 0 %
HCT: 24.7 % — ABNORMAL LOW (ref 39.0–52.0)
Hemoglobin: 7.9 g/dL — ABNORMAL LOW (ref 13.0–17.0)
Immature Granulocytes: 1 %
Lymphocytes Relative: 20 %
Lymphs Abs: 0.6 10*3/uL — ABNORMAL LOW (ref 0.7–4.0)
MCH: 32.2 pg (ref 26.0–34.0)
MCHC: 32 g/dL (ref 30.0–36.0)
MCV: 100.8 fL — ABNORMAL HIGH (ref 80.0–100.0)
Monocytes Absolute: 0.3 10*3/uL (ref 0.1–1.0)
Monocytes Relative: 9 %
Neutro Abs: 2.1 10*3/uL (ref 1.7–7.7)
Neutrophils Relative %: 70 %
Platelets: 25 10*3/uL — CL (ref 150–400)
RBC: 2.45 MIL/uL — ABNORMAL LOW (ref 4.22–5.81)
RDW: 18.2 % — ABNORMAL HIGH (ref 11.5–15.5)
WBC: 3.1 10*3/uL — ABNORMAL LOW (ref 4.0–10.5)
nRBC: 0 % (ref 0.0–0.2)

## 2020-11-02 NOTE — Progress Notes (Signed)
CRITICAL VALUE ALERT  Critical Value:  Platelets 25  Date & Time Notied:  11/02/2020 at [place] [facility]  Provider Notified: Dr. Delton Coombes  Orders Received/Actions taken: Hold selinexor and velcade this week. recheck labs next week

## 2020-11-02 NOTE — Progress Notes (Signed)
Patients port flushed without difficulty.  Good blood return noted with no bruising or swelling noted at site.  Transparent dressing applied.  Patient remains accessed for chemotherapy treatment.  

## 2020-11-02 NOTE — Progress Notes (Signed)
Myeloma Labs drawn.  No treatment today.

## 2020-11-02 NOTE — Patient Instructions (Signed)
[place] [facility] Discharge Instructions  You were seen today by Dr. Delton Coombes. He went over your recent results. You did not receive Velcade injection today; do not take selinexor for 1 week, but do take Decadron 20 mg today. You may stop taking Eliquis until you return for your next visit so that your platelet count can recover. You had labs drawn today for further analysis. Dr. Delton Coombes will see you back in 1 week for labs and follow up.   Thank you for choosing [place] at [facility] to provide your oncology and hematology care.  To afford each patient quality time with our provider, please arrive at least 15 minutes before your scheduled appointment time.   If you have a lab appointment with the [facility] please come in thru the Main Entrance and check in at the main information desk  You need to re-schedule your appointment should you arrive 10 or more minutes late.  We strive to give you quality time with our providers, and arriving late affects you and other patients whose appointments are after yours.  Also, if you no show three or more times for appointments you may be dismissed from the clinic at the providers discretion.     Again, thank you for choosing [facility].  Our hope is that these requests will decrease the amount of time that you wait before being seen by our physicians.       _____________________________________________________________  Should you have questions after your visit to [facility], please contact our office at [phone] between the hours of 8:00 a.m. and 4:30 p.m.  Voicemails left after 4:00 p.m. will not be returned until the following business day.  For prescription refill requests, have your pharmacy contact our office and allow 72 hours.    Cancer Center Support Programs:   > Cancer Support Group  2nd Tuesday of the month 1pm-2pm, Journey Room

## 2020-11-02 NOTE — Progress Notes (Signed)
Patient assessed and labs reviewed by Dr. Delton Coombes. No treatment today. Primary RN is aware.

## 2020-11-02 NOTE — Progress Notes (Signed)
Tom Marshall, [place] [id number]   CLINIC:  Medical Oncology/Hematology  PCP:  Asencion Noble, MD [address] / [place] [address]  [phone]  REASON FOR VISIT:  Follow-up for multiple myeloma  PRIOR THERAPY:  1. Revlimid x 4 cycles from 09/05/2017 to 02/18/2018 with Revlimid maintenance. 2. Pomalyst from 06/20/2020 to 09/19/2020. 3. Darzalex x 6 cycles from 04/11/2020 to 09/05/2020.  CURRENT THERAPY: Velcade and selinexor weekly x5 weeks, 1 week off  INTERVAL HISTORY:  Tom Marshall, a 70 y.o. male, returns for routine follow-up for his multiple myeloma. Xayne was last seen by Sandi Mealy, PA, on 10/19/2020.  Today he reports feeling okay. He continues having bleeding with stool intermittently, the last episode being on 1/3 with a small amount of blood; he denies having constipation or nosebleeds. He has started producing some urine but still goes to dialysis on T/Th/Sat.   REVIEW OF SYSTEMS:  Review of Systems  HENT:   Negative for nosebleeds.   Gastrointestinal: Positive for blood in stool (intermittently small amounts of blood). Negative for constipation.  Genitourinary: Positive for difficulty urinating (on HD).     PAST MEDICAL/SURGICAL HISTORY:  Past Medical History:  Diagnosis Date  . Cancer ([place])    multiple myeloma  . Cancer of right kidney ([place])   . Cervical dystonia   . Diabetes mellitus without complication ([place])   . Gout   . Hypercholesteremia    Past Surgical History:  Procedure Laterality Date  . BONE MARROW BIOPSY    . CHOLECYSTECTOMY  2007  . COLONOSCOPY WITH PROPOFOL N/A 01/16/2018   Procedure: COLONOSCOPY WITH PROPOFOL;  Surgeon: Daneil Dolin, MD;  Location: [facility];  Service: Endoscopy;  Laterality: N/A;  1:45pm  . ESOPHAGOGASTRODUODENOSCOPY (EGD) WITH PROPOFOL N/A 01/16/2018   Procedure: ESOPHAGOGASTRODUODENOSCOPY (EGD) WITH PROPOFOL;  Surgeon: Daneil Dolin, MD;  Location: [facility];  Service: Endoscopy;  Laterality: N/A;  . GIVENS CAPSULE STUDY N/A 05/12/2018   Procedure: GIVENS CAPSULE STUDY;  Surgeon: Daneil Dolin, MD;  Location: [facility];  Service: Endoscopy;  Laterality: N/A;  7:30am  . IR FLUORO GUIDE CV LINE LEFT  09/29/2020  . IR FLUORO GUIDE CV LINE LEFT  10/05/2020  . IR US GUIDE VASC ACCESS LEFT  09/29/2020  . IR US GUIDE VASC ACCESS LEFT  10/05/2020  . NEPHRECTOMY Right 1998   cancer  . PORTACATH PLACEMENT Right 04/07/2020   Procedure: INSERTION PORT-A-CATH (attached catherter in right internal jugular);  Surgeon: Virl Cagey, MD;  Location: [facility];  Service: General;  Laterality: Right;    SOCIAL HISTORY:  Social History   Socioeconomic History  . Marital status: Single    Spouse name: Not on file  . Number of children: Not on file  . Years of education: Not on file  . Highest education level: Not on file  Occupational History  . Occupation: Marine scientist, Insurance underwriter  Tobacco Use  . Smoking status: Never Smoker  . Smokeless tobacco: Former Systems developer    Types: Secondary school teacher  . Vaping Use: Never used  Substance and Sexual Activity  . Alcohol use: No  . Drug use: No  . Sexual activity: Not Currently  Other Topics Concern  . Not on file  Social History Narrative  . Not on file   Social Determinants of Health   Financial Resource Strain: Low Risk   . Difficulty of Paying Living Expenses: Not hard  at all  Food Insecurity: No Food Insecurity  . Worried About Charity fundraiser in the Last Year: Never true  . Ran Out of Food in the Last Year: Never true  Transportation Needs: No Transportation Needs  . Lack of Transportation (Medical): No  . Lack of Transportation (Non-Medical): No  Physical Activity: Inactive  . Days of Exercise per Week: 0 days  . Minutes of Exercise per Session: 0 min  Stress: No Stress Concern Present  . Feeling of Stress : Not at all  Social Connections: Moderately Integrated  . Frequency of  Communication with Friends and Family: More than three times a week  . Frequency of Social Gatherings with Friends and Family: More than three times a week  . Attends Religious Services: More than 4 times per year  . Active Member of Clubs or Organizations: Yes  . Attends Archivist Meetings: More than 4 times per year  . Marital Status: Never married  Intimate Partner Violence: Not At Risk  . Fear of Current or Ex-Partner: No  . Emotionally Abused: No  . Physically Abused: No  . Sexually Abused: No    FAMILY HISTORY:  Family History  Problem Relation Age of Onset  . Heart failure Mother 69  . Dementia Father   . Colon cancer Neg Hx     CURRENT MEDICATIONS:  Current Outpatient Medications  Medication Sig Dispense Refill  . acyclovir (ZOVIRAX) 200 MG capsule Take 1 capsule (200 mg total) by mouth 2 (two) times daily. 60 capsule 3  . alfuzosin (UROXATRAL) 10 MG 24 hr tablet Take 1 tablet (10 mg total) by mouth daily with breakfast. 30 tablet 11  . allopurinol (ZYLOPRIM) 100 MG tablet Take 2 tablets (200 mg total) by mouth every morning. 60 tablet 0  . apixaban (ELIQUIS) 2.5 MG TABS tablet Take 1 tablet (2.5 mg total) by mouth daily. Start taking from tomorrow 10/06/2020    . B-D UF III MINI PEN NEEDLES 31G X 5 MM MISC   4  . dexamethasone (DECADRON) 4 MG tablet Take 5 tablets (20 mg total) by mouth once a week. 20 tablet 3  . dicyclomine (BENTYL) 10 MG capsule Take 10 mg by mouth 2 (two) times daily as needed.     . diphenoxylate-atropine (LOMOTIL) 2.5-0.025 MG tablet Take 2 tablets by mouth 4 (four) times daily as needed.     . furosemide (LASIX) 40 MG tablet Take 1.5 tablets (60 mg total) by mouth daily. 45 tablet 1  . gabapentin (NEURONTIN) 300 MG capsule Take 1 capsule (300 mg total) by mouth at bedtime.    [name] glipiZIDE (GLUCOTROL XL) 10 MG 24 hr tablet Take 10 mg by mouth daily.    . Multiple Vitamins-Minerals (CENTRUM SILVER 50+MEN) TABS Take 1 tablet by mouth every  morning.    [name] NOVOLOG FLEXPEN 100 UNIT/ML FlexPen Inject 8 Units into the skin 3 (three) times daily with meals.     . pravastatin (PRAVACHOL) 20 MG tablet Take 20 mg by mouth at bedtime.     . selinexor (XPOVIO) 40 MG TBPK Take 40 mg by mouth once a week. 4 each 0  . sodium bicarbonate 650 MG tablet Take by mouth.    . traZODone (DESYREL) 100 MG tablet Take 1 tablet (100 mg total) by mouth at bedtime. 30 tablet 3  . TRESIBA FLEXTOUCH 200 UNIT/ML FlexTouch Pen Inject 16 Units into the skin daily.    . TRUE METRIX BLOOD GLUCOSE TEST test strip     .  acetaminophen (TYLENOL) 325 MG tablet Take 650 mg by mouth every 6 (six) hours as needed. (Patient not taking: Reported on 11/02/2020)    . cyclobenzaprine (FLEXERIL) 10 MG tablet TAKE 1 TABLET THREE TIMES DAILY AS NEEDED FOR MUSCLE SPASM (Patient not taking: Reported on 11/02/2020) 30 tablet 0  . HYDROcodone-acetaminophen (NORCO/VICODIN) 5-325 MG tablet Take 1 tablet by mouth 2 (two) times daily as needed for severe pain. (Patient not taking: Reported on 11/02/2020) 60 tablet 0   No current facility-administered medications for this visit.   Facility-Administered Medications Ordered in Other Visits  Medication Dose Route Frequency Provider Last Rate Last Admin  . 0.9 %  sodium chloride infusion  250 mL Intravenous Once Twana First, MD        ALLERGIES:  No Known Allergies  PHYSICAL EXAM:  Performance status (ECOG): 1 - Symptomatic but completely ambulatory  Vitals:   11/02/20 0912  BP: (!) 111/52  Pulse: (!) 111  Resp: 20  Temp: (!) 97.5 F (36.4 C)  SpO2: 96%   Wt Readings from Last 3 Encounters:  11/02/20 229 lb 12.8 oz (104.2 kg)  10/19/20 232 lb (105.2 kg)  10/17/20 229 lb 9.6 oz (104.1 kg)   Physical Exam Vitals reviewed.  Constitutional:      Appearance: Normal appearance. He is obese.  Cardiovascular:     Rate and Rhythm: Normal rate and regular rhythm.     Pulses: Normal pulses.     Heart sounds: Normal heart sounds.   Pulmonary:     Effort: Pulmonary effort is normal.     Breath sounds: Normal breath sounds.  Chest:     Comments: Port-a-Cath in L chest Musculoskeletal:     Right lower leg: Edema (1+) present.     Left lower leg: Edema (1+) present.  Neurological:     General: No focal deficit present.     Mental Status: He is alert and oriented to person, place, and time.  Psychiatric:        Mood and Affect: Mood normal.        Behavior: Behavior normal.     LABORATORY DATA:  I have reviewed the labs as listed.  CBC Latest Ref Rng & Units 11/02/2020 10/24/2020 10/19/2020  WBC 4.0 - 10.5 K/uL 3.1(L) 4.0 6.1  Hemoglobin 13.0 - 17.0 g/dL 7.9(L) 7.6(L) 7.9(L)  Hematocrit 39.0 - 52.0 % 24.7(L) 23.5(L) 23.8(L)  Platelets 150 - 400 K/uL 25(LL) 39(L) 63(L)   CMP Latest Ref Rng & Units 11/02/2020 10/24/2020 10/19/2020  Glucose 70 - 99 mg/dL 144(H) 115(H) 200(H)  BUN 8 - 23 mg/dL 28(H) 33(H) 36(H)  Creatinine 0.61 - 1.24 mg/dL 5.31(H) 5.76(H) 5.86(H)  Sodium 135 - 145 mmol/L 139 136 134(L)  Potassium 3.5 - 5.1 mmol/L 4.5 3.6 3.3(L)  Chloride 98 - 111 mmol/L 99 94(L) 93(L)  CO2 22 - 32 mmol/L '28 29 27  ' Calcium 8.9 - 10.3 mg/dL 9.0 8.9 8.2(L)  Total Protein 6.5 - 8.1 g/dL 6.4(L) 6.0(L) 5.9(L)  Total Bilirubin 0.3 - 1.2 mg/dL 0.7 0.5 0.7  Alkaline Phos 38 - 126 U/L 60 53 62  AST 15 - 41 U/L '17 16 16  ' ALT 0 - 44 U/L '25 23 21      ' Component Value Date/Time   RBC 2.45 (L) 11/02/2020 0923   MCV 100.8 (H) 11/02/2020 0923   MCH 32.2 11/02/2020 0923   MCHC 32.0 11/02/2020 0923   RDW 18.2 (H) 11/02/2020 0923   LYMPHSABS 0.6 (L) 11/02/2020 9163  MONOABS 0.3 11/02/2020 0923   EOSABS 0.0 11/02/2020 0923   BASOSABS 0.0 11/02/2020 3818    DIAGNOSTIC IMAGING:  I have independently reviewed the scans and discussed with the patient. IR Fluoro Guide CV Line Left  Result Date: 10/06/2020 CLINICAL DATA:  Renal failure requiring hemodialysis, history of multiple myeloma and need for tunneled hemodialysis  catheter. The patient currently has a non tunneled temporary dialysis catheter via the left internal jugular vein which was placed on 09/29/2020. He has an indwelling right jugular Port-A-Cath. EXAM: TUNNELED CENTRAL VENOUS HEMODIALYSIS CATHETER PLACEMENT WITH ULTRASOUND AND FLUOROSCOPIC GUIDANCE ANESTHESIA/SEDATION: 0.5 mg IV Versed; 25 mcg IV Fentanyl. Total Moderate Sedation Time:   30 minutes. The patient's level of consciousness and physiologic status were continuously monitored during the procedure by Radiology nursing. MEDICATIONS: 2 g IV Ancef. FLUOROSCOPY TIME:  30 seconds. PROCEDURE: The procedure, risks, benefits, and alternatives were explained to the patient. Questions regarding the procedure were encouraged and answered. The patient understands and consents to the procedure. A timeout was performed prior to initiating the procedure. The indwelling left jugular non tunneled hemodialysis catheter was removed and manual compression applied over the exit site. The left neck and chest were then prepped with chlorhexidine in a sterile fashion, and a sterile drape was applied covering the operative field. Maximum barrier sterile technique with sterile gowns and gloves were used for the procedure. Local anesthesia was provided with 1% lidocaine. Patency of the left internal jugular vein was confirmed by ultrasound. After creating a small venotomy incision, a 21 gauge needle was advanced into the left internal jugular vein under direct, real-time ultrasound guidance. Ultrasound image documentation was performed. After securing guidewire access, an 8 Fr dilator was placed. A J-wire was kinked to measure appropriate catheter length. A Palindrome tunneled hemodialysis catheter measuring 23 cm from tip to cuff was chosen for placement. This was tunneled in a retrograde fashion from the chest wall to the venotomy incision. At the venotomy, serial dilatation was performed and a 15 Fr peel-away sheath was placed over  a guidewire. The catheter was then placed through the sheath and the sheath removed. Final catheter positioning was confirmed and documented with a fluoroscopic spot image. The catheter was aspirated, flushed with saline, and injected with appropriate volume heparin dwells. The venotomy incision was closed with subcuticular 4-0 Vicryl. Dermabond was applied to the incision. The catheter exit site was secured with 0-Prolene retention sutures. COMPLICATIONS: None.  No pneumothorax. FINDINGS: After catheter placement, the tip lies in the right atrium. The catheter aspirates normally and is ready for immediate use. IMPRESSION: Placement of tunneled hemodialysis catheter via new access of the left internal jugular vein. The catheter tip lies in the right atrium. The catheter is ready for immediate use. Prior to tunnel catheter placement the non tunneled temporary left IJ catheter was removed. Electronically Signed   By: Aletta Edouard M.D.   On: 10/06/2020 08:17   IR US Guide Vasc Access Left  Result Date: 10/06/2020 CLINICAL DATA:  Renal failure requiring hemodialysis, history of multiple myeloma and need for tunneled hemodialysis catheter. The patient currently has a non tunneled temporary dialysis catheter via the left internal jugular vein which was placed on 09/29/2020. He has an indwelling right jugular Port-A-Cath. EXAM: TUNNELED CENTRAL VENOUS HEMODIALYSIS CATHETER PLACEMENT WITH ULTRASOUND AND FLUOROSCOPIC GUIDANCE ANESTHESIA/SEDATION: 0.5 mg IV Versed; 25 mcg IV Fentanyl. Total Moderate Sedation Time:   30 minutes. The patient's level of consciousness and physiologic status were continuously monitored during the procedure  by Radiology nursing. MEDICATIONS: 2 g IV Ancef. FLUOROSCOPY TIME:  30 seconds. PROCEDURE: The procedure, risks, benefits, and alternatives were explained to the patient. Questions regarding the procedure were encouraged and answered. The patient understands and consents to the procedure.  A timeout was performed prior to initiating the procedure. The indwelling left jugular non tunneled hemodialysis catheter was removed and manual compression applied over the exit site. The left neck and chest were then prepped with chlorhexidine in a sterile fashion, and a sterile drape was applied covering the operative field. Maximum barrier sterile technique with sterile gowns and gloves were used for the procedure. Local anesthesia was provided with 1% lidocaine. Patency of the left internal jugular vein was confirmed by ultrasound. After creating a small venotomy incision, a 21 gauge needle was advanced into the left internal jugular vein under direct, real-time ultrasound guidance. Ultrasound image documentation was performed. After securing guidewire access, an 8 Fr dilator was placed. A J-wire was kinked to measure appropriate catheter length. A Palindrome tunneled hemodialysis catheter measuring 23 cm from tip to cuff was chosen for placement. This was tunneled in a retrograde fashion from the chest wall to the venotomy incision. At the venotomy, serial dilatation was performed and a 15 Fr peel-away sheath was placed over a guidewire. The catheter was then placed through the sheath and the sheath removed. Final catheter positioning was confirmed and documented with a fluoroscopic spot image. The catheter was aspirated, flushed with saline, and injected with appropriate volume heparin dwells. The venotomy incision was closed with subcuticular 4-0 Vicryl. Dermabond was applied to the incision. The catheter exit site was secured with 0-Prolene retention sutures. COMPLICATIONS: None.  No pneumothorax. FINDINGS: After catheter placement, the tip lies in the right atrium. The catheter aspirates normally and is ready for immediate use. IMPRESSION: Placement of tunneled hemodialysis catheter via new access of the left internal jugular vein. The catheter tip lies in the right atrium. The catheter is ready for  immediate use. Prior to tunnel catheter placement the non tunneled temporary left IJ catheter was removed. Electronically Signed   By: Aletta Edouard M.D.   On: 10/06/2020 08:17     ASSESSMENT:  1. IgG kappa multiple myeloma: -4 cycles of RVD from 08/30/2017 through 02/18/2018. -Declined bone marrow transplant. Received maintenance Revlimid 2.5 mg 3 weeks on 1 week off. -Bone marrow biopsy on 02/23/2020 shows 21% plasma cells. Occasional sections had up to 75% myeloma cells. -Chromosome analysis was normal. FISH panel was positive for gain of 1 q., monosomy 13, del 17 P, t(14;20) -PET scan showed new left frontal destructive lesion. Numerous areas of lytic changes in the spine. Lucent areas in T6 with loss of height at T5. Numerous lytic changes throughout the spine noted along with pedicle and lamina and transverse process of T8 vertebral body. Profound hypermetabolic activity within the ribs bilaterally. -Daratumumab, pomalidomide and dexamethasone from 04/11/2020 through 09/05/2020 with progression. -Velcade started on 10/01/2020, selinexor and dexamethasone added.  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 -Completed XRT on 04/05/2020. -MRI thoracic spine on 07/26/2020 showed significant overall improvement in multiple myeloma with much of the bone marrow has returned to normal fatty marrow. Ventral epidural tumor on the right of T5 near completely resolved. Proximal right T7 rib  lesion also resolved.  3. 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 He is on Eliquis.   PLAN:  1. IgG kappa multiple myeloma: -He is tolerating Velcade very well.  Velcade is on day 1, 8, 15, 22 every 35 days. -Selinexor was started at 40 mg dose weekly along with  dexamethasone 20 mg weekly. -He is tolerating it very well. -Reviewed labs from today which showed white count 3.1 with ANC 2.1.  Platelet count is 25. -I will hold Velcade and selinexor. -He was told to take dexamethasone 20 mg today. -We will send myeloma labs today.  RTC 1 week.  2.  ESRD on HD: -He is receiving hemodialysis on Tuesday, Thursday and Saturday. -He is producing some urine lately.  3. Left leg DVT: -He is on Eliquis 2.5 mg daily.  I have told him to discontinue Eliquis secondary to thrombocytopenia.  We will restart it once thrombocytopenia resolves.  4.Diabetes: -Continue Tresiba and NovoLog.  5. Insomnia: -Continue trazodone 100 mg at bedtime.  6. Lower extremity swelling: -This has improved since dialysis.  7.  Neuropathy: -He did not see improvement with gabapentin.  Closely monitor.   Orders placed this encounter:  Orders Placed This Encounter  Procedures  . Protein electrophoresis, serum  . Kappa/lambda light chains     Derek Jack, MD [place] [phone]   I, Milinda Antis, am acting as a scribe for Dr. Sanda Linger.  I, Derek Jack MD, have reviewed the above documentation for accuracy and completeness, and I agree with the above.

## 2020-11-03 DIAGNOSIS — Z23 Encounter for immunization: Secondary | ICD-10-CM | POA: Diagnosis not present

## 2020-11-03 DIAGNOSIS — N2581 Secondary hyperparathyroidism of renal origin: Secondary | ICD-10-CM | POA: Diagnosis not present

## 2020-11-03 DIAGNOSIS — Z992 Dependence on renal dialysis: Secondary | ICD-10-CM | POA: Diagnosis not present

## 2020-11-03 DIAGNOSIS — D509 Iron deficiency anemia, unspecified: Secondary | ICD-10-CM | POA: Diagnosis not present

## 2020-11-03 DIAGNOSIS — N178 Other acute kidney failure: Secondary | ICD-10-CM | POA: Diagnosis not present

## 2020-11-03 DIAGNOSIS — D649 Anemia, unspecified: Secondary | ICD-10-CM | POA: Diagnosis not present

## 2020-11-03 LAB — KAPPA/LAMBDA LIGHT CHAINS
Kappa, lambda light chain ratio: 2207.49 — ABNORMAL HIGH (ref 0.26–1.65)
Lambda free light chains: 9.3 mg/L (ref 5.7–26.3)

## 2020-11-03 LAB — PROTEIN ELECTROPHORESIS, SERUM
A/G Ratio: 1.4 (ref 0.7–1.7)
Albumin ELP: 3.5 g/dL (ref 2.9–4.4)
Alpha-1-Globulin: 0.2 g/dL (ref 0.0–0.4)
Alpha-2-Globulin: 1 g/dL (ref 0.4–1.0)
Beta Globulin: 0.9 g/dL (ref 0.7–1.3)
Gamma Globulin: 0.5 g/dL (ref 0.4–1.8)
Globulin, Total: 2.5 g/dL (ref 2.2–3.9)
M-Spike, %: 0.3 g/dL — ABNORMAL HIGH
Total Protein ELP: 6 g/dL (ref 6.0–8.5)

## 2020-11-05 DIAGNOSIS — D509 Iron deficiency anemia, unspecified: Secondary | ICD-10-CM | POA: Diagnosis not present

## 2020-11-05 DIAGNOSIS — D649 Anemia, unspecified: Secondary | ICD-10-CM | POA: Diagnosis not present

## 2020-11-05 DIAGNOSIS — N178 Other acute kidney failure: Secondary | ICD-10-CM | POA: Diagnosis not present

## 2020-11-05 DIAGNOSIS — N2581 Secondary hyperparathyroidism of renal origin: Secondary | ICD-10-CM | POA: Diagnosis not present

## 2020-11-05 DIAGNOSIS — Z992 Dependence on renal dialysis: Secondary | ICD-10-CM | POA: Diagnosis not present

## 2020-11-05 DIAGNOSIS — Z23 Encounter for immunization: Secondary | ICD-10-CM | POA: Diagnosis not present

## 2020-11-08 DIAGNOSIS — Z992 Dependence on renal dialysis: Secondary | ICD-10-CM | POA: Diagnosis not present

## 2020-11-08 DIAGNOSIS — N179 Acute kidney failure, unspecified: Secondary | ICD-10-CM | POA: Diagnosis not present

## 2020-11-08 DIAGNOSIS — N178 Other acute kidney failure: Secondary | ICD-10-CM | POA: Diagnosis not present

## 2020-11-08 DIAGNOSIS — N2581 Secondary hyperparathyroidism of renal origin: Secondary | ICD-10-CM | POA: Diagnosis not present

## 2020-11-08 DIAGNOSIS — D649 Anemia, unspecified: Secondary | ICD-10-CM | POA: Diagnosis not present

## 2020-11-08 DIAGNOSIS — Z23 Encounter for immunization: Secondary | ICD-10-CM | POA: Diagnosis not present

## 2020-11-08 DIAGNOSIS — D509 Iron deficiency anemia, unspecified: Secondary | ICD-10-CM | POA: Diagnosis not present

## 2020-11-09 ENCOUNTER — Inpatient Hospital Stay (HOSPITAL_BASED_OUTPATIENT_CLINIC_OR_DEPARTMENT_OTHER): Payer: Medicare Other | Admitting: Hematology

## 2020-11-09 ENCOUNTER — Encounter (HOSPITAL_COMMUNITY): Payer: Self-pay | Admitting: *Deleted

## 2020-11-09 ENCOUNTER — Inpatient Hospital Stay (HOSPITAL_COMMUNITY): Payer: Medicare Other

## 2020-11-09 ENCOUNTER — Other Ambulatory Visit: Payer: Self-pay

## 2020-11-09 VITALS — BP 126/53 | HR 112 | Temp 97.3°F | Resp 20 | Wt 226.2 lb

## 2020-11-09 VITALS — HR 100

## 2020-11-09 DIAGNOSIS — C9000 Multiple myeloma not having achieved remission: Secondary | ICD-10-CM | POA: Diagnosis not present

## 2020-11-09 DIAGNOSIS — E119 Type 2 diabetes mellitus without complications: Secondary | ICD-10-CM | POA: Diagnosis not present

## 2020-11-09 DIAGNOSIS — Z86718 Personal history of other venous thrombosis and embolism: Secondary | ICD-10-CM | POA: Diagnosis not present

## 2020-11-09 DIAGNOSIS — Z5112 Encounter for antineoplastic immunotherapy: Secondary | ICD-10-CM | POA: Diagnosis not present

## 2020-11-09 DIAGNOSIS — Z7901 Long term (current) use of anticoagulants: Secondary | ICD-10-CM | POA: Diagnosis not present

## 2020-11-09 DIAGNOSIS — D696 Thrombocytopenia, unspecified: Secondary | ICD-10-CM | POA: Diagnosis not present

## 2020-11-09 LAB — CBC WITH DIFFERENTIAL/PLATELET
Abs Immature Granulocytes: 0.04 10*3/uL (ref 0.00–0.07)
Basophils Absolute: 0 10*3/uL (ref 0.0–0.1)
Basophils Relative: 0 %
Eosinophils Absolute: 0 10*3/uL (ref 0.0–0.5)
Eosinophils Relative: 1 %
HCT: 23.1 % — ABNORMAL LOW (ref 39.0–52.0)
Hemoglobin: 7.3 g/dL — ABNORMAL LOW (ref 13.0–17.0)
Immature Granulocytes: 1 %
Lymphocytes Relative: 19 %
Lymphs Abs: 0.8 10*3/uL (ref 0.7–4.0)
MCH: 32.2 pg (ref 26.0–34.0)
MCHC: 31.6 g/dL (ref 30.0–36.0)
MCV: 101.8 fL — ABNORMAL HIGH (ref 80.0–100.0)
Monocytes Absolute: 0.3 10*3/uL (ref 0.1–1.0)
Monocytes Relative: 8 %
Neutro Abs: 3.1 10*3/uL (ref 1.7–7.7)
Neutrophils Relative %: 71 %
Platelets: 29 10*3/uL — CL (ref 150–400)
RBC: 2.27 MIL/uL — ABNORMAL LOW (ref 4.22–5.81)
RDW: 18.6 % — ABNORMAL HIGH (ref 11.5–15.5)
WBC: 4.2 10*3/uL (ref 4.0–10.5)
nRBC: 0 % (ref 0.0–0.2)

## 2020-11-09 LAB — COMPREHENSIVE METABOLIC PANEL
ALT: 19 U/L (ref 0–44)
AST: 14 U/L — ABNORMAL LOW (ref 15–41)
Albumin: 3.6 g/dL (ref 3.5–5.0)
Alkaline Phosphatase: 57 U/L (ref 38–126)
Anion gap: 14 (ref 5–15)
BUN: 34 mg/dL — ABNORMAL HIGH (ref 8–23)
CO2: 29 mmol/L (ref 22–32)
Calcium: 9.1 mg/dL (ref 8.9–10.3)
Chloride: 95 mmol/L — ABNORMAL LOW (ref 98–111)
Creatinine, Ser: 5.96 mg/dL — ABNORMAL HIGH (ref 0.61–1.24)
GFR, Estimated: 10 mL/min — ABNORMAL LOW (ref >60)
Glucose, Bld: 202 mg/dL — ABNORMAL HIGH (ref 70–99)
Potassium: 3.9 mmol/L (ref 3.5–5.1)
Sodium: 138 mmol/L (ref 135–145)
Total Bilirubin: 0.7 mg/dL (ref 0.3–1.2)
Total Protein: 6.5 g/dL (ref 6.5–8.1)

## 2020-11-09 MED ORDER — DEXAMETHASONE 4 MG PO TABS
20.0000 mg | ORAL_TABLET | Freq: Once | ORAL | Status: AC
  Administered 2020-11-09: 20 mg via ORAL
  Filled 2020-11-09: qty 5

## 2020-11-09 MED ORDER — BORTEZOMIB CHEMO SQ INJECTION 3.5 MG (2.5MG/ML)
1.0000 mg/m2 | Freq: Once | INTRAMUSCULAR | Status: AC
  Administered 2020-11-09: 2.25 mg via SUBCUTANEOUS
  Filled 2020-11-09: qty 0.9

## 2020-11-09 NOTE — Patient Instructions (Signed)
[facility] Discharge Instructions for Patients Receiving Chemotherapy   Beginning January 23rd 2017 lab work for the Cancer Center will be done in the  Main lab at  on 1st floor. If you have a lab appointment with the Cancer Center please come in thru the  Main Entrance and check in at the main information desk   Today you received the following chemotherapy agents Velcade injection. Follow-up as scheduled  To help prevent nausea and vomiting after your treatment, we encourage you to take your nausea medication   If you develop nausea and vomiting, or diarrhea that is not controlled by your medication, call the clinic.  The clinic phone number is [phone]. Office hours are Monday-Friday 8:30am-5:00pm.  BELOW ARE SYMPTOMS THAT SHOULD BE REPORTED IMMEDIATELY:  *FEVER GREATER THAN 101.0 F  *CHILLS WITH OR WITHOUT FEVER  NAUSEA AND VOMITING THAT IS NOT CONTROLLED WITH YOUR NAUSEA MEDICATION  *UNUSUAL SHORTNESS OF BREATH  *UNUSUAL BRUISING OR BLEEDING  TENDERNESS IN MOUTH AND THROAT WITH OR WITHOUT PRESENCE OF ULCERS  *URINARY PROBLEMS  *BOWEL PROBLEMS  UNUSUAL RASH Items with * indicate a potential emergency and should be followed up as soon as possible. If you have an emergency after office hours please contact your primary care physician or go to the nearest emergency department.  Please call the clinic during office hours if you have any questions or concerns.   You may also contact the Patient Navigator at [phone] should you have any questions or need assistance in obtaining follow up care.      Resources For Cancer Patients and their Caregivers ? American Cancer Society: Can assist with transportation, wigs, general needs, runs Look Good Feel Better.        1-888-227-6333 ? Cancer Care: Provides financial assistance, online support groups, medication/co-pay assistance.  1-800-813-HOPE (4673) ? [facility] Assists [place] cancer patients and their families through emotional , educational and financial support.  336-427-4357 ? [place] DSS Where to apply for food stamps, Medicaid and utility assistance. 336-342-1394 ? [facility]: Transportation to medical appointments. 336-347-2287 ? Social Security Administration: May apply for disability if have a Stage IV cancer. 336-342-7796 1-800-772-1213 ? [place] Aging, Disability and Transit Services: Assists with nutrition, care and transit needs. 336-349-2343         

## 2020-11-09 NOTE — Progress Notes (Signed)
[id number] Labs reviewed with and pt seen by Dr. Delton Coombes and pt approved for Velcade injection with dose reduction today and instructions for pt to stop taking the Selinexor until further notice per MD                                                                                             Tom Marshall tolerated Velcade injection well without complaints or incident. Pt informed to hold his Selinexor medication this week per MD order with understanding verbalized. Pt discharged self ambulatory in satisfactory condition

## 2020-11-09 NOTE — Progress Notes (Signed)
[place], [place] [address]   CLINIC:  Medical Oncology/Hematology  PCP:  Asencion Noble, MD [address] / [place] [address] [phone]   REASON FOR VISIT:  Follow-up for multiple myeloma  PRIOR THERAPY:  1. Revlimid x 4 cycles from 09/05/2017 to 02/18/2018 with Revlimid maintenance. 2. Pomalyst from 06/20/2020 to 09/19/2020. 3. Darzalex x 6 cycles from 04/11/2020 to 09/05/2020.  NGS Results: Not done  CURRENT THERAPY: Velcade and selinexor weekly x 5 weeks, 1 week off  BRIEF ONCOLOGIC HISTORY:  Oncology History  Multiple myeloma ([place])  08/29/2017 Initial Diagnosis   Multiple myeloma ([place])   12/10/2017 - 03/11/2018 Chemotherapy   The patient had dexamethasone (DECADRON) 4 MG tablet, 1 of 1 cycle, Start date: --, End date: -- lenalidomide (REVLIMID) 25 MG capsule, 1 of 1 cycle, Start date: --, End date: -- bortezomib SQ (VELCADE) chemo injection 2.75 mg, 1.3 mg/m2 = 2.75 mg, Subcutaneous,  Once, 5 of 5 cycles Administration: 2.75 mg (12/10/2017), 2.75 mg (12/17/2017), 2.75 mg (12/31/2017), 2.75 mg (01/21/2018), 2.75 mg (02/18/2018), 2.75 mg (03/11/2018)  for chemotherapy treatment.    04/11/2020 - 09/05/2020 Chemotherapy   The patient had daratumumab (DARZALEX) 800 mg in sodium chloride 0.9 % 960 mL (0.8 mg/mL) chemo infusion, 8 mg/kg = 800 mg (100 % of original dose 8 mg/kg), Intravenous, Once, 1 of 1 cycle Dose modification: 8 mg/kg (original dose 8 mg/kg, Cycle 1, Reason: Provider Judgment, Comment: giving total dose 39m/kg over 2 days) daratumumab (DARZALEX) 800 mg in sodium chloride 0.9 % 460 mL (1.6 mg/mL) chemo infusion, 8 mg/kg = 800 mg (50 % of original dose 16 mg/kg), Intravenous, Once, 2 of 2 cycles Dose modification: 8 mg/kg (original dose 16 mg/kg, Cycle 1, Reason: Provider Judgment, Comment: giving 16 mg/kg over 2 days. thus 8 mg/kg in 2 divided doses.) Administration: 1,600 mg (04/19/2020), 1,600 mg (05/03/2020), 1,600 mg  (05/09/2020), 800 mg (04/11/2020), 800 mg (04/12/2020) daratumumab (DARZALEX) 1,600 mg in sodium chloride 0.9 % 420 mL chemo infusion, 16 mg/kg = 1,600 mg, Intravenous, Once, 5 of 6 cycles Administration: 1,600 mg (05/16/2020), 1,600 mg (05/23/2020), 1,600 mg (05/30/2020), 1,600 mg (06/06/2020), 1,600 mg (06/13/2020), 1,600 mg (06/27/2020), 1,600 mg (07/11/2020), 1,600 mg (07/25/2020), 1,600 mg (08/08/2020), 1,600 mg (08/22/2020), 1,600 mg (09/05/2020)  for chemotherapy treatment.    09/30/2020 -  Chemotherapy    Patient is on Treatment Plan: MYELOMA RELAPSED / REFRACTORY BORTEZOMIB SQ (WEEKLY X 5 THEN OFF 1 WEEK) + DEXAMETHASONE  + SELINEXOR        CANCER STAGING: Cancer Staging No matching staging information was found for the patient.  INTERVAL HISTORY:  Mr. WNARVEL KOZUB a 70y.o. male, returns for routine follow-up and consideration for next cycle of chemotherapy. WGreigwas last seen on 11/02/2020.  Due for cycle #2 of Velcade today.   Overall, he tells me he has been feeling good. He has been feeling good overall this week. He tolerated the previous treatment well and denies having N/V/D, though he reports having jitteriness in his hands and feet in the morning. He continues doing dialysis on T/Th/Sat and is starting to urinate a small amount, urinating twice on 1/10 and once on 1/11. He reports having some soreness in his left lateral rib wall since 1/9; he denies having similar pain before. He has only taken 3 tablets from his currently bottle of selinexor.  Overall, he feels ready for next cycle of chemo today.    REVIEW  OF SYSTEMS:  Review of Systems  Constitutional: Positive for fatigue (50%). Negative for appetite change.  Cardiovascular: Positive for chest pain (L lateral chest wall soreness).  Neurological: Positive for numbness (fingers & hands).       Jitteriness in hands and feet in AM  All other systems reviewed and are negative.   PAST MEDICAL/SURGICAL HISTORY:  Past  Medical History:  Diagnosis Date  . Cancer ([place])    multiple myeloma  . Cancer of right kidney ([place])   . Cervical dystonia   . Diabetes mellitus without complication ([place])   . Gout   . Hypercholesteremia    Past Surgical History:  Procedure Laterality Date  . BONE MARROW BIOPSY    . CHOLECYSTECTOMY  2007  . COLONOSCOPY WITH PROPOFOL N/A 01/16/2018   Procedure: COLONOSCOPY WITH PROPOFOL;  Surgeon: Daneil Dolin, MD;  Location: [facility];  Service: Endoscopy;  Laterality: N/A;  1:45pm  . ESOPHAGOGASTRODUODENOSCOPY (EGD) WITH PROPOFOL N/A 01/16/2018   Procedure: ESOPHAGOGASTRODUODENOSCOPY (EGD) WITH PROPOFOL;  Surgeon: Daneil Dolin, MD;  Location: [facility];  Service: Endoscopy;  Laterality: N/A;  . GIVENS CAPSULE STUDY N/A 05/12/2018   Procedure: GIVENS CAPSULE STUDY;  Surgeon: Daneil Dolin, MD;  Location: [facility];  Service: Endoscopy;  Laterality: N/A;  7:30am  . IR FLUORO GUIDE CV LINE LEFT  09/29/2020  . IR FLUORO GUIDE CV LINE LEFT  10/05/2020  . IR US GUIDE VASC ACCESS LEFT  09/29/2020  . IR US GUIDE VASC ACCESS LEFT  10/05/2020  . NEPHRECTOMY Right 1998   cancer  . PORTACATH PLACEMENT Right 04/07/2020   Procedure: INSERTION PORT-A-CATH (attached catherter in right internal jugular);  Surgeon: Virl Cagey, MD;  Location: [facility] ORS;  Service: General;  Laterality: Right;    SOCIAL HISTORY:  Social History   Socioeconomic History  . Marital status: Single    Spouse name: Not on file  . Number of children: Not on file  . Years of education: Not on file  . Highest education level: Not on file  Occupational History  . Occupation: Marine scientist, Insurance underwriter  Tobacco Use  . Smoking status: Never Smoker  . Smokeless tobacco: Former Systems developer    Types: Secondary school teacher  . Vaping Use: Never used  Substance and Sexual Activity  . Alcohol use: No  . Drug use: No  . Sexual activity: Not Currently  Other Topics Concern  . Not on file  Social History Narrative   . Not on file   Social Determinants of Health   Financial Resource Strain: Low Risk   . Difficulty of Paying Living Expenses: Not hard at all  Food Insecurity: No Food Insecurity  . Worried About Charity fundraiser in the Last Year: Never true  . Ran Out of Food in the Last Year: Never true  Transportation Needs: No Transportation Needs  . Lack of Transportation (Medical): No  . Lack of Transportation (Non-Medical): No  Physical Activity: Inactive  . Days of Exercise per Week: 0 days  . Minutes of Exercise per Session: 0 min  Stress: No Stress Concern Present  . Feeling of Stress : Not at all  Social Connections: Moderately Integrated  . Frequency of Communication with Friends and Family: More than three times a week  . Frequency of Social Gatherings with Friends and Family: More than three times a week  . Attends Religious Services: More than 4 times per year  . Active Member of Clubs or  Organizations: Yes  . Attends Archivist Meetings: More than 4 times per year  . Marital Status: Never married  Intimate Partner Violence: Not At Risk  . Fear of Current or Ex-Partner: No  . Emotionally Abused: No  . Physically Abused: No  . Sexually Abused: No    FAMILY HISTORY:  Family History  Problem Relation Age of Onset  . Heart failure Mother 55  . Dementia Father   . Colon cancer Neg Hx     CURRENT MEDICATIONS:  Current Outpatient Medications  Medication Sig Dispense Refill  . acyclovir (ZOVIRAX) 200 MG capsule Take 1 capsule (200 mg total) by mouth 2 (two) times daily. 60 capsule 3  . alfuzosin (UROXATRAL) 10 MG 24 hr tablet Take 1 tablet (10 mg total) by mouth daily with breakfast. 30 tablet 11  . allopurinol (ZYLOPRIM) 100 MG tablet Take 2 tablets (200 mg total) by mouth every morning. 60 tablet 0  . apixaban (ELIQUIS) 2.5 MG TABS tablet Take 1 tablet (2.5 mg total) by mouth daily. Start taking from tomorrow 10/06/2020    . B-D UF III MINI PEN NEEDLES 31G X 5 MM  MISC   4  . cyclobenzaprine (FLEXERIL) 10 MG tablet TAKE 1 TABLET THREE TIMES DAILY AS NEEDED FOR MUSCLE SPASM 30 tablet 0  . dexamethasone (DECADRON) 4 MG tablet Take 5 tablets (20 mg total) by mouth once a week. 20 tablet 3  . dicyclomine (BENTYL) 10 MG capsule Take 10 mg by mouth 2 (two) times daily as needed.     . diphenoxylate-atropine (LOMOTIL) 2.5-0.025 MG tablet Take 2 tablets by mouth 4 (four) times daily as needed.     . furosemide (LASIX) 40 MG tablet Take 1.5 tablets (60 mg total) by mouth daily. 45 tablet 1  . gabapentin (NEURONTIN) 300 MG capsule Take 1 capsule (300 mg total) by mouth at bedtime.    [name] glipiZIDE (GLUCOTROL XL) 10 MG 24 hr tablet Take 10 mg by mouth daily.    . Multiple Vitamins-Minerals (CENTRUM SILVER 50+MEN) TABS Take 1 tablet by mouth every morning.    [name] NOVOLOG FLEXPEN 100 UNIT/ML FlexPen Inject 8 Units into the skin 3 (three) times daily with meals.     . pravastatin (PRAVACHOL) 20 MG tablet Take 20 mg by mouth at bedtime.     . selinexor (XPOVIO) 40 MG TBPK Take 40 mg by mouth once a week. 4 each 0  . sodium bicarbonate 650 MG tablet Take by mouth.    . traZODone (DESYREL) 100 MG tablet Take 1 tablet (100 mg total) by mouth at bedtime. 30 tablet 3  . TRESIBA FLEXTOUCH 200 UNIT/ML FlexTouch Pen Inject 16 Units into the skin daily.    . TRUE METRIX BLOOD GLUCOSE TEST test strip     . acetaminophen (TYLENOL) 325 MG tablet Take 650 mg by mouth every 6 (six) hours as needed. (Patient not taking: Reported on 11/09/2020)    . HYDROcodone-acetaminophen (NORCO/VICODIN) 5-325 MG tablet Take 1 tablet by mouth 2 (two) times daily as needed for severe pain. (Patient not taking: Reported on 11/09/2020) 60 tablet 0   No current facility-administered medications for this visit.   Facility-Administered Medications Ordered in Other Visits  Medication Dose Route Frequency Provider Last Rate Last Admin  . 0.9 %  sodium chloride infusion  250 mL Intravenous Once Twana First, MD       . bortezomib SQ (VELCADE) chemo injection (2.10m/mL concentration) 2.25 mg  1 mg/m2 (Treatment Plan Recorded) Subcutaneous  Once Derek Jack, MD        ALLERGIES:  No Known Allergies  PHYSICAL EXAM:  Performance status (ECOG): 1 - Symptomatic but completely ambulatory  Vitals:   11/09/20 1204  BP: (!) 126/53  Pulse: (!) 112  Resp: 20  Temp: (!) 97.3 F (36.3 C)  SpO2: 97%   Wt Readings from Last 3 Encounters:  11/09/20 226 lb 3.2 oz (102.6 kg)  11/02/20 229 lb 12.8 oz (104.2 kg)  10/19/20 232 lb (105.2 kg)   Physical Exam Vitals reviewed.  Constitutional:      Appearance: Normal appearance. He is obese.  Cardiovascular:     Rate and Rhythm: Normal rate and regular rhythm.     Pulses: Normal pulses.     Heart sounds: Normal heart sounds.  Pulmonary:     Effort: Pulmonary effort is normal.     Breath sounds: Normal breath sounds.  Chest:     Chest wall: Tenderness (L lateral ribs TTP) present.  Musculoskeletal:     Right lower leg: Edema (trace) present.     Left lower leg: Edema (trace) present.  Neurological:     General: No focal deficit present.     Mental Status: He is alert and oriented to person, place, and time.  Psychiatric:        Mood and Affect: Mood normal.        Behavior: Behavior normal.     LABORATORY DATA:  I have reviewed the labs as listed.  CBC Latest Ref Rng & Units 11/09/2020 11/02/2020 10/24/2020  WBC 4.0 - 10.5 K/uL 4.2 3.1(L) 4.0  Hemoglobin 13.0 - 17.0 g/dL 7.3(L) 7.9(L) 7.6(L)  Hematocrit 39.0 - 52.0 % 23.1(L) 24.7(L) 23.5(L)  Platelets 150 - 400 K/uL 29(LL) 25(LL) 39(L)   CMP Latest Ref Rng & Units 11/09/2020 11/02/2020 10/24/2020  Glucose 70 - 99 mg/dL 202(H) 144(H) 115(H)  BUN 8 - 23 mg/dL 34(H) 28(H) 33(H)  Creatinine 0.61 - 1.24 mg/dL 5.96(H) 5.31(H) 5.76(H)  Sodium 135 - 145 mmol/L 138 139 136  Potassium 3.5 - 5.1 mmol/L 3.9 4.5 3.6  Chloride 98 - 111 mmol/L 95(L) 99 94(L)  CO2 22 - 32 mmol/L '29 28 29  ' Calcium 8.9  - 10.3 mg/dL 9.1 9.0 8.9  Total Protein 6.5 - 8.1 g/dL 6.5 6.4(L) 6.0(L)  Total Bilirubin 0.3 - 1.2 mg/dL 0.7 0.7 0.5  Alkaline Phos 38 - 126 U/L 57 60 53  AST 15 - 41 U/L 14(L) 17 16  ALT 0 - 44 U/L '19 25 23   ' Lab Results  Component Value Date   TOTALPROTELP 6.0 11/02/2020   ALBUMINELP 3.5 11/02/2020   A1GS 0.2 11/02/2020   A2GS 1.0 11/02/2020   BETS 0.9 11/02/2020   GAMS 0.5 11/02/2020   MSPIKE 0.3 (H) 11/02/2020   SPEI Comment 11/02/2020    Lab Results  Component Value Date   KPAFRELGTCHN Note: (A) 11/02/2020   LAMBDASER 9.3 11/02/2020   KAPLAMBRATIO 2,207.49 (H) 11/02/2020    DIAGNOSTIC IMAGING:  I have independently reviewed the scans and discussed with the patient. No results found.   ASSESSMENT:  1. IgG kappa multiple myeloma: -4 cycles of RVD from 08/30/2017 through 02/18/2018. -Declined bone marrow transplant. Received maintenance Revlimid 2.5 mg 3 weeks on 1 week off. -Bone marrow biopsy on 02/23/2020 shows 21% plasma cells. Occasional sections had up to 75% myeloma cells. -Chromosome analysis was normal. FISH panel was positive for gain of 1 q., monosomy 13, del 17 P, t(14;20) -PET scan showed  new left frontal destructive lesion. Numerous areas of lytic changes in the spine. Lucent areas in T6 with loss of height at T5. Numerous lytic changes throughout the spine noted along with pedicle and lamina and transverse process of T8 vertebral body. Profound hypermetabolic activity within the ribs bilaterally. -Daratumumab, pomalidomide and dexamethasone from 04/11/2020 through 09/05/2020 with progression. -Velcade started on 10/01/2020, selinexor and dexamethasone added.  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 -Completed XRT on  04/05/2020. -MRI thoracic spine on 07/26/2020 showed significant overall improvement in multiple myeloma with much of the bone marrow has returned to normal fatty marrow. Ventral epidural tumor on the right of T5 near completely resolved. Proximal right T7 rib lesion also resolved.  3. 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 He is on Eliquis.   PLAN:  1. IgG kappa multiple myeloma: -Reviewed myeloma labs from 11/02/2020.  M spike increased to 0.3 from 0.2 g. - Kappa light chains increased to 20,000 and ratio of 2200. - CBC shows platelet count is 29.  Hemoglobin 7.3.  White count is normal. - We will continue to hold selinexor today.  We will dose reduce Velcade to 1 mg per metered square along with dexamethasone. - We will reach out to Dr. Aris Lot at [facility] to see if he has any other recommendations. - Reevaluate in 1 week.  2.  ESRD on HD: -Continue HD on Tuesday, Thursday and Saturday.  He is producing about 1/3 cup of urine daily.  3. Left leg DVT: -He is on Eliquis 2.5 mg daily.  We have been holding it since his platelet count dropped to 25.  We will continue to hold it.  4.Diabetes: -Continue Tresiba and NovoLog.  5. Insomnia: -Continue trazodone 100 mg at bedtime.  6. Lower extremity swelling: -Lower extremity swelling has improved since dialysis.  7.  Neuropathy: -This has been stable.  He did not see an improvement with gabapentin.  We will closely monitor for worsening.   Orders placed this encounter:  No orders of the defined types were placed in this encounter.    Derek Jack, MD [place] [phone]   I, Milinda Antis, am acting as a scribe for Dr. Sanda Linger.  I, Derek Jack MD, have reviewed the above documentation for accuracy and completeness, and I agree with the above.

## 2020-11-09 NOTE — Patient Instructions (Addendum)
[place] at [facility] Discharge Instructions  You were seen today by Dr. Delton Coombes. He went over your recent results. You received your treatment today; do not take selinexor this week. Dr. Delton Coombes will see you back in 1 week for labs and follow up.   Thank you for choosing [place] at [facility] to provide your oncology and hematology care.  To afford each patient quality time with our provider, please arrive at least 15 minutes before your scheduled appointment time.   If you have a lab appointment with the [facility] please come in thru the Main Entrance and check in at the main information desk  You need to re-schedule your appointment should you arrive 10 or more minutes late.  We strive to give you quality time with our providers, and arriving late affects you and other patients whose appointments are after yours.  Also, if you no show three or more times for appointments you may be dismissed from the clinic at the providers discretion.     Again, thank you for choosing [facility].  Our hope is that these requests will decrease the amount of time that you wait before being seen by our physicians.       _____________________________________________________________  Should you have questions after your visit to [facility], please contact our office at [phone] between the hours of 8:00 a.m. and 4:30 p.m.  Voicemails left after 4:00 p.m. will not be returned until the following business day.  For prescription refill requests, have your pharmacy contact our office and allow 72 hours.    Cancer Center Support Programs:   > Cancer Support Group  2nd Tuesday of the month 1pm-2pm, Journey Room

## 2020-11-09 NOTE — Progress Notes (Signed)
Critical value alert:   Platelet count 29k   Provider aware, no orders received.

## 2020-11-10 ENCOUNTER — Other Ambulatory Visit (HOSPITAL_COMMUNITY): Payer: Medicare Other

## 2020-11-10 ENCOUNTER — Ambulatory Visit (HOSPITAL_COMMUNITY): Payer: Medicare Other

## 2020-11-10 ENCOUNTER — Other Ambulatory Visit (HOSPITAL_COMMUNITY): Payer: Self-pay | Admitting: *Deleted

## 2020-11-10 DIAGNOSIS — D649 Anemia, unspecified: Secondary | ICD-10-CM | POA: Diagnosis not present

## 2020-11-10 DIAGNOSIS — C9000 Multiple myeloma not having achieved remission: Secondary | ICD-10-CM

## 2020-11-10 DIAGNOSIS — N178 Other acute kidney failure: Secondary | ICD-10-CM | POA: Diagnosis not present

## 2020-11-10 DIAGNOSIS — D509 Iron deficiency anemia, unspecified: Secondary | ICD-10-CM | POA: Diagnosis not present

## 2020-11-10 DIAGNOSIS — Z992 Dependence on renal dialysis: Secondary | ICD-10-CM | POA: Diagnosis not present

## 2020-11-10 DIAGNOSIS — N2581 Secondary hyperparathyroidism of renal origin: Secondary | ICD-10-CM | POA: Diagnosis not present

## 2020-11-10 DIAGNOSIS — Z23 Encounter for immunization: Secondary | ICD-10-CM | POA: Diagnosis not present

## 2020-11-10 MED ORDER — XPOVIO (40 MG ONCE WEEKLY) 40 MG PO TBPK: 40.0000 mg | ORAL_TABLET | ORAL | 0 refills | Status: DC

## 2020-11-12 DIAGNOSIS — N178 Other acute kidney failure: Secondary | ICD-10-CM | POA: Diagnosis not present

## 2020-11-12 DIAGNOSIS — Z23 Encounter for immunization: Secondary | ICD-10-CM | POA: Diagnosis not present

## 2020-11-12 DIAGNOSIS — D509 Iron deficiency anemia, unspecified: Secondary | ICD-10-CM | POA: Diagnosis not present

## 2020-11-12 DIAGNOSIS — D649 Anemia, unspecified: Secondary | ICD-10-CM | POA: Diagnosis not present

## 2020-11-12 DIAGNOSIS — N2581 Secondary hyperparathyroidism of renal origin: Secondary | ICD-10-CM | POA: Diagnosis not present

## 2020-11-12 DIAGNOSIS — Z992 Dependence on renal dialysis: Secondary | ICD-10-CM | POA: Diagnosis not present

## 2020-11-15 DIAGNOSIS — Z23 Encounter for immunization: Secondary | ICD-10-CM | POA: Diagnosis not present

## 2020-11-15 DIAGNOSIS — D509 Iron deficiency anemia, unspecified: Secondary | ICD-10-CM | POA: Diagnosis not present

## 2020-11-15 DIAGNOSIS — N178 Other acute kidney failure: Secondary | ICD-10-CM | POA: Diagnosis not present

## 2020-11-15 DIAGNOSIS — Z992 Dependence on renal dialysis: Secondary | ICD-10-CM | POA: Diagnosis not present

## 2020-11-15 DIAGNOSIS — N2581 Secondary hyperparathyroidism of renal origin: Secondary | ICD-10-CM | POA: Diagnosis not present

## 2020-11-15 DIAGNOSIS — D649 Anemia, unspecified: Secondary | ICD-10-CM | POA: Diagnosis not present

## 2020-11-16 ENCOUNTER — Inpatient Hospital Stay (HOSPITAL_COMMUNITY): Payer: Medicare Other

## 2020-11-16 ENCOUNTER — Inpatient Hospital Stay (HOSPITAL_COMMUNITY): Payer: Medicare Other | Admitting: Hematology

## 2020-11-16 ENCOUNTER — Telehealth (HOSPITAL_COMMUNITY): Payer: Self-pay

## 2020-11-16 NOTE — Telephone Encounter (Signed)
Patient stated that he missed his office visit and Velcade injection today because he was told since he was referred to a specialist he did not need to come to [facility].  However, patient wanted to know should he take his Xpovio medication since he did not get the injection.  Patient was informed per Dr. Delton Coombes not to take Xpovio at this time.  Patient acknowledged understanding. No further questions or concerns at this time.

## 2020-11-17 DIAGNOSIS — E039 Hypothyroidism, unspecified: Secondary | ICD-10-CM | POA: Diagnosis not present

## 2020-11-17 DIAGNOSIS — N179 Acute kidney failure, unspecified: Secondary | ICD-10-CM | POA: Diagnosis not present

## 2020-11-17 DIAGNOSIS — Z992 Dependence on renal dialysis: Secondary | ICD-10-CM | POA: Diagnosis not present

## 2020-11-17 DIAGNOSIS — D509 Iron deficiency anemia, unspecified: Secondary | ICD-10-CM | POA: Diagnosis not present

## 2020-11-17 DIAGNOSIS — Z23 Encounter for immunization: Secondary | ICD-10-CM | POA: Diagnosis not present

## 2020-11-17 DIAGNOSIS — N2581 Secondary hyperparathyroidism of renal origin: Secondary | ICD-10-CM | POA: Diagnosis not present

## 2020-11-17 DIAGNOSIS — D649 Anemia, unspecified: Secondary | ICD-10-CM | POA: Diagnosis not present

## 2020-11-17 DIAGNOSIS — E119 Type 2 diabetes mellitus without complications: Secondary | ICD-10-CM | POA: Diagnosis not present

## 2020-11-17 DIAGNOSIS — N178 Other acute kidney failure: Secondary | ICD-10-CM | POA: Diagnosis not present

## 2020-11-18 DIAGNOSIS — E1122 Type 2 diabetes mellitus with diabetic chronic kidney disease: Secondary | ICD-10-CM | POA: Diagnosis not present

## 2020-11-18 DIAGNOSIS — Z794 Long term (current) use of insulin: Secondary | ICD-10-CM | POA: Diagnosis not present

## 2020-11-18 DIAGNOSIS — C9002 Multiple myeloma in relapse: Secondary | ICD-10-CM | POA: Diagnosis not present

## 2020-11-18 DIAGNOSIS — C9000 Multiple myeloma not having achieved remission: Secondary | ICD-10-CM | POA: Diagnosis not present

## 2020-11-18 DIAGNOSIS — D649 Anemia, unspecified: Secondary | ICD-10-CM | POA: Diagnosis not present

## 2020-11-18 DIAGNOSIS — Z79899 Other long term (current) drug therapy: Secondary | ICD-10-CM | POA: Diagnosis not present

## 2020-11-18 DIAGNOSIS — N189 Chronic kidney disease, unspecified: Secondary | ICD-10-CM | POA: Diagnosis not present

## 2020-11-19 DIAGNOSIS — D509 Iron deficiency anemia, unspecified: Secondary | ICD-10-CM | POA: Diagnosis not present

## 2020-11-19 DIAGNOSIS — N2581 Secondary hyperparathyroidism of renal origin: Secondary | ICD-10-CM | POA: Diagnosis not present

## 2020-11-19 DIAGNOSIS — Z992 Dependence on renal dialysis: Secondary | ICD-10-CM | POA: Diagnosis not present

## 2020-11-19 DIAGNOSIS — Z23 Encounter for immunization: Secondary | ICD-10-CM | POA: Diagnosis not present

## 2020-11-19 DIAGNOSIS — N178 Other acute kidney failure: Secondary | ICD-10-CM | POA: Diagnosis not present

## 2020-11-19 DIAGNOSIS — D649 Anemia, unspecified: Secondary | ICD-10-CM | POA: Diagnosis not present

## 2020-11-21 ENCOUNTER — Other Ambulatory Visit (HOSPITAL_COMMUNITY): Payer: Self-pay | Admitting: *Deleted

## 2020-11-21 ENCOUNTER — Other Ambulatory Visit (HOSPITAL_COMMUNITY): Payer: Self-pay | Admitting: Hematology

## 2020-11-21 DIAGNOSIS — D649 Anemia, unspecified: Secondary | ICD-10-CM | POA: Diagnosis not present

## 2020-11-21 DIAGNOSIS — C9000 Multiple myeloma not having achieved remission: Secondary | ICD-10-CM | POA: Diagnosis not present

## 2020-11-21 NOTE — Progress Notes (Signed)
Received note from provider that patient will need echocardiogram before treatment this week.  Orders placed.    Received message from Dr. Aris Lot at [facility] that patient's hgb was 6.6 and they were unable to transfuse him.  He asked if we could get patient in at our clinic.    Our schedulers will call with appts.

## 2020-11-22 DIAGNOSIS — D649 Anemia, unspecified: Secondary | ICD-10-CM | POA: Diagnosis not present

## 2020-11-22 DIAGNOSIS — D509 Iron deficiency anemia, unspecified: Secondary | ICD-10-CM | POA: Diagnosis not present

## 2020-11-22 DIAGNOSIS — Z992 Dependence on renal dialysis: Secondary | ICD-10-CM | POA: Diagnosis not present

## 2020-11-22 DIAGNOSIS — Z23 Encounter for immunization: Secondary | ICD-10-CM | POA: Diagnosis not present

## 2020-11-22 DIAGNOSIS — C9000 Multiple myeloma not having achieved remission: Secondary | ICD-10-CM | POA: Diagnosis not present

## 2020-11-22 DIAGNOSIS — N178 Other acute kidney failure: Secondary | ICD-10-CM | POA: Diagnosis not present

## 2020-11-22 DIAGNOSIS — N2581 Secondary hyperparathyroidism of renal origin: Secondary | ICD-10-CM | POA: Diagnosis not present

## 2020-11-22 NOTE — Progress Notes (Signed)
DISCONTINUE ON PATHWAY REGIMEN - Multiple Myeloma and Other Plasma Cell Dyscrasias     A cycle is every 35 days:     Dexamethasone      Selinexor      Bortezomib   **Always confirm dose/schedule in your pharmacy ordering system**  REASON: Disease Progression PRIOR TREATMENT: [id number]: Selinexor 100 mg PO Weekly + Bortezomib 1.3 mg/m2 SUBQ D1, 8, 15, 22 + Dexamethasone 20 mg PO Twice Weekly q35 Days Until Progression or Unacceptable Toxicity TREATMENT RESPONSE: Progressive Disease (PD)  START ON PATHWAY REGIMEN - Multiple Myeloma and Other Plasma Cell Dyscrasias     A cycle is every 28 days:     Carfilzomib      Carfilzomib      Carfilzomib      Dexamethasone      Dexamethasone   **Always confirm dose/schedule in your pharmacy ordering system**  Patient Characteristics: Multiple Myeloma, Relapsed / Refractory, Second through Fourth Lines of Therapy, Fit or Candidate for Triplet Therapy, Lenalidomide-Refractory or Lenalidomide-based Regimen Not Preferred, Not a Candidate for Anti-CD38 Antibody Disease Classification: Multiple Myeloma R-ISS Staging: Unknown Therapeutic Status: Relapsed Line of Therapy: Third Line Anti-CD38 Antibody Candidacy: Not a Candidate for Anti-CD38 Antibody Lenalidomide-based Regimen Preference/Candidacy: Lenalidomide-Refractory Intent of Therapy: Non-Curative / Palliative Intent, Discussed with Patient

## 2020-11-23 ENCOUNTER — Inpatient Hospital Stay (HOSPITAL_COMMUNITY): Payer: Medicare Other

## 2020-11-23 ENCOUNTER — Other Ambulatory Visit (HOSPITAL_COMMUNITY): Payer: Self-pay | Admitting: *Deleted

## 2020-11-23 ENCOUNTER — Inpatient Hospital Stay (HOSPITAL_BASED_OUTPATIENT_CLINIC_OR_DEPARTMENT_OTHER): Payer: Medicare Other | Admitting: Hematology

## 2020-11-23 VITALS — BP 130/56 | HR 85 | Temp 98.2°F | Resp 17

## 2020-11-23 VITALS — BP 109/52 | HR 106 | Temp 97.5°F | Resp 20 | Wt 225.9 lb

## 2020-11-23 DIAGNOSIS — Z7901 Long term (current) use of anticoagulants: Secondary | ICD-10-CM | POA: Diagnosis not present

## 2020-11-23 DIAGNOSIS — C9000 Multiple myeloma not having achieved remission: Secondary | ICD-10-CM | POA: Diagnosis not present

## 2020-11-23 DIAGNOSIS — D696 Thrombocytopenia, unspecified: Secondary | ICD-10-CM | POA: Diagnosis not present

## 2020-11-23 DIAGNOSIS — Z5112 Encounter for antineoplastic immunotherapy: Secondary | ICD-10-CM | POA: Diagnosis not present

## 2020-11-23 DIAGNOSIS — D649 Anemia, unspecified: Secondary | ICD-10-CM

## 2020-11-23 DIAGNOSIS — E119 Type 2 diabetes mellitus without complications: Secondary | ICD-10-CM | POA: Diagnosis not present

## 2020-11-23 DIAGNOSIS — Z86718 Personal history of other venous thrombosis and embolism: Secondary | ICD-10-CM | POA: Diagnosis not present

## 2020-11-23 LAB — CBC WITH DIFFERENTIAL/PLATELET
Abs Immature Granulocytes: 0.08 10*3/uL — ABNORMAL HIGH (ref 0.00–0.07)
Basophils Absolute: 0 10*3/uL (ref 0.0–0.1)
Basophils Relative: 0 %
Eosinophils Absolute: 0 10*3/uL (ref 0.0–0.5)
Eosinophils Relative: 1 %
HCT: 18.9 % — ABNORMAL LOW (ref 39.0–52.0)
Hemoglobin: 6.2 g/dL — CL (ref 13.0–17.0)
Immature Granulocytes: 2 %
Lymphocytes Relative: 16 %
Lymphs Abs: 0.7 10*3/uL (ref 0.7–4.0)
MCH: 33.7 pg (ref 26.0–34.0)
MCHC: 32.8 g/dL (ref 30.0–36.0)
MCV: 102.7 fL — ABNORMAL HIGH (ref 80.0–100.0)
Monocytes Absolute: 0.3 10*3/uL (ref 0.1–1.0)
Monocytes Relative: 8 %
Neutro Abs: 2.9 10*3/uL (ref 1.7–7.7)
Neutrophils Relative %: 73 %
Platelets: 28 10*3/uL — CL (ref 150–400)
RBC: 1.84 MIL/uL — ABNORMAL LOW (ref 4.22–5.81)
RDW: 20.1 % — ABNORMAL HIGH (ref 11.5–15.5)
WBC: 4 10*3/uL (ref 4.0–10.5)
nRBC: 0 % (ref 0.0–0.2)

## 2020-11-23 LAB — COMPREHENSIVE METABOLIC PANEL
ALT: 22 U/L (ref 0–44)
AST: 19 U/L (ref 15–41)
Albumin: 3.1 g/dL — ABNORMAL LOW (ref 3.5–5.0)
Alkaline Phosphatase: 68 U/L (ref 38–126)
Anion gap: 12 (ref 5–15)
BUN: 31 mg/dL — ABNORMAL HIGH (ref 8–23)
CO2: 30 mmol/L (ref 22–32)
Calcium: 9.5 mg/dL (ref 8.9–10.3)
Chloride: 96 mmol/L — ABNORMAL LOW (ref 98–111)
Creatinine, Ser: 5.85 mg/dL — ABNORMAL HIGH (ref 0.61–1.24)
GFR, Estimated: 10 mL/min — ABNORMAL LOW (ref >60)
Glucose, Bld: 176 mg/dL — ABNORMAL HIGH (ref 70–99)
Potassium: 3.4 mmol/L — ABNORMAL LOW (ref 3.5–5.1)
Sodium: 138 mmol/L (ref 135–145)
Total Bilirubin: 0.5 mg/dL (ref 0.3–1.2)
Total Protein: 6.1 g/dL — ABNORMAL LOW (ref 6.5–8.1)

## 2020-11-23 LAB — PREPARE RBC (CROSSMATCH)

## 2020-11-23 MED ORDER — HYDROCODONE-ACETAMINOPHEN 10-325 MG PO TABS
1.0000 | ORAL_TABLET | Freq: Once | ORAL | Status: DC
  Filled 2020-11-23: qty 1

## 2020-11-23 MED ORDER — ONDANSETRON HCL 4 MG/2ML IJ SOLN
8.0000 mg | Freq: Once | INTRAMUSCULAR | Status: AC
  Administered 2020-11-23: 8 mg via INTRAVENOUS

## 2020-11-23 MED ORDER — SODIUM CHLORIDE 0.9 % IV SOLN: Freq: Once | INTRAVENOUS | Status: AC

## 2020-11-23 MED ORDER — ONDANSETRON HCL 4 MG/2ML IJ SOLN
INTRAMUSCULAR | Status: AC
  Filled 2020-11-23: qty 4

## 2020-11-23 MED ORDER — DEXTROSE 5 % IV SOLN
20.0000 mg/m2 | Freq: Once | INTRAVENOUS | Status: AC
  Administered 2020-11-23: 44 mg via INTRAVENOUS
  Filled 2020-11-23: qty 22

## 2020-11-23 MED ORDER — HYDROCODONE-ACETAMINOPHEN 5-325 MG PO TABS
2.0000 | ORAL_TABLET | Freq: Once | ORAL | Status: AC
  Administered 2020-11-23: 2 via ORAL

## 2020-11-23 MED ORDER — HEPARIN SOD (PORK) LOCK FLUSH 100 UNIT/ML IV SOLN
500.0000 [IU] | Freq: Once | INTRAVENOUS | Status: AC | PRN
  Administered 2020-11-23: 500 [IU]

## 2020-11-23 MED ORDER — SODIUM CHLORIDE 0.9% FLUSH
10.0000 mL | INTRAVENOUS | Status: DC | PRN
  Administered 2020-11-23: 10 mL via INTRAVENOUS

## 2020-11-23 MED ORDER — HYDROCODONE-ACETAMINOPHEN 5-325 MG PO TABS
ORAL_TABLET | ORAL | Status: AC
  Filled 2020-11-23: qty 2

## 2020-11-23 MED ORDER — HYDROCODONE-ACETAMINOPHEN 10-325 MG PO TABS: 1.0000 | ORAL_TABLET | Freq: Two times a day (BID) | ORAL | 0 refills | Status: DC | PRN

## 2020-11-23 MED ORDER — SODIUM CHLORIDE 0.9 % IV SOLN
40.0000 mg | Freq: Once | INTRAVENOUS | Status: AC
  Administered 2020-11-23: 40 mg via INTRAVENOUS
  Filled 2020-11-23: qty 4

## 2020-11-23 MED ORDER — CYCLOPHOSPHAMIDE CHEMO INJECTION 1 GM
300.0000 mg/m2 | Freq: Once | INTRAMUSCULAR | Status: AC
  Administered 2020-11-23: 660 mg via INTRAVENOUS
  Filled 2020-11-23: qty 33

## 2020-11-23 NOTE — Progress Notes (Signed)
Patient here for port flush with labs today.  Port accessed without difficulty.  Patient tolerated well.  Patient left accessed for treatment today.

## 2020-11-23 NOTE — Progress Notes (Signed)
Patient here today for treatment.  Dr. Delton Coombes has assessed patient and reviewed labs.  Potassium 3.4, creatinine 5.85, hgb 6.2.  Patient okay for treatment today.  Patient will get 2 units of PRBC on Friday.  Patient will also get hydrocodone 10/325 mg today for his pain 7/10.    Pain reassessed at 1640 and it had significantly improved 2/10. Patient repositioned himself and states that he is comfortable at this time.    Patient tolerated treatment without incidence.  His port was deaccessed and bandaid applied.  Patient discharged via wheelchair from clinic with self.  He will follow up Friday for blood transfusion.

## 2020-11-23 NOTE — Patient Instructions (Signed)
[place] at [facility] Discharge Instructions  You were seen today by Dr. Delton Coombes. He went over your recent results. Your treatment will be changed to Kyprolis, Cytoxan and Decadron, given once a week. Side effects include mild to moderate fatigue. Keep your appointment to have your echo done on 1/28. Dr. Delton Coombes will see you back in 1 week for labs and follow up.   Thank you for choosing [place] at [facility] to provide your oncology and hematology care.  To afford each patient quality time with our provider, please arrive at least 15 minutes before your scheduled appointment time.   If you have a lab appointment with the [place] please come in thru the Main Entrance and check in at the main information desk  You need to re-schedule your appointment should you arrive 10 or more minutes late.  We strive to give you quality time with our providers, and arriving late affects you and other patients whose appointments are after yours.  Also, if you no show three or more times for appointments you may be dismissed from the clinic at the providers discretion.     Again, thank you for choosing [facility].  Our hope is that these requests will decrease the amount of time that you wait before being seen by our physicians.       _____________________________________________________________  Should you have questions after your visit to [facility], please contact our office at [phone] between the hours of 8:00 a.m. and 4:30 p.m.  Voicemails left after 4:00 p.m. will not be returned until the following business day.  For prescription refill requests, have your pharmacy contact our office and allow 72 hours.    Cancer Center Support Programs:   > Cancer Support Group  2nd Tuesday of the month 1pm-2pm, Journey Room

## 2020-11-23 NOTE — Progress Notes (Signed)
Tom Marshall, [place] [address]   CLINIC:  Medical Oncology/Hematology  PCP:  Asencion Noble, MD [address] / [place] [address]  [phone]  REASON FOR VISIT:  Follow-up for multiple myeloma  PRIOR THERAPY:  1. Revlimid x 4 cycles from 09/05/2017 to 02/18/2018 with Revlimid maintenance. 2. Pomalyst from 06/20/2020 to 09/19/2020. 3. Darzalex x 6 cycles from 04/11/2020 to 09/05/2020. 4. Velcade x 2 cycles from 10/01/2020 to 11/09/2020.  CURRENT THERAPY: To begin Kyprolis, Cytoxan and Decadron weekly  INTERVAL HISTORY:  Tom Marshall, a 70 y.o. male, returns for routine follow-up for his multiple myeloma. Tom Marshall was last seen on 11/09/2020.  Due for initiating cycle #1 of carfilzomib and cytoxan today.  Today he reports feeling okay. He complains of having back pain which started in the end of December and worse when he stands up and radiating around his abdomen; the pain eases when he sits and lies down and has to ambulate with a walker. His low energy levels and back pain have limited his activity levels. He has numbness in his hands, but not in his feet. His appetite is excellent and he denies N/V.  He is scheduled for an echo on 1/28. He continues going to dialysis on T/Th/Sat.  Overall, he feels ready for first cycle of carfilzomib and cytoxan today.   REVIEW OF SYSTEMS:  Review of Systems  Constitutional: Positive for fatigue (depleted). Negative for appetite change.  Gastrointestinal: Negative for nausea and vomiting.  Musculoskeletal: Positive for back pain (lower back pain radiating to abdomen).  Neurological: Positive for numbness (hands).  All other systems reviewed and are negative.   PAST MEDICAL/SURGICAL HISTORY:  Past Medical History:  Diagnosis Date  . Cancer ([place])    multiple myeloma  . Cancer of right kidney ([place])   . Cervical dystonia   . Diabetes mellitus without complication ([place])   . Gout    . Hypercholesteremia    Past Surgical History:  Procedure Laterality Date  . BONE MARROW BIOPSY    . CHOLECYSTECTOMY  2007  . COLONOSCOPY WITH PROPOFOL N/A 01/16/2018   Procedure: COLONOSCOPY WITH PROPOFOL;  Surgeon: Daneil Dolin, MD;  [facility];  Service: Endoscopy;  Laterality: N/A;  1:45pm  . ESOPHAGOGASTRODUODENOSCOPY (EGD) WITH PROPOFOL N/A 01/16/2018   Procedure: ESOPHAGOGASTRODUODENOSCOPY (EGD) WITH PROPOFOL;  Surgeon: Daneil Dolin, MD;  Location: [facility];  Service: Endoscopy;  Laterality: N/A;  . GIVENS CAPSULE STUDY N/A 05/12/2018   Procedure: GIVENS CAPSULE STUDY;  Surgeon: Daneil Dolin, MD;  Location: [facility];  Service: Endoscopy;  Laterality: N/A;  7:30am  . IR FLUORO GUIDE CV LINE LEFT  09/29/2020  . IR FLUORO GUIDE CV LINE LEFT  10/05/2020  . IR US GUIDE VASC ACCESS LEFT  09/29/2020  . IR US GUIDE VASC ACCESS LEFT  10/05/2020  . NEPHRECTOMY Right 1998   cancer  . PORTACATH PLACEMENT Right 04/07/2020   Procedure: INSERTION PORT-A-CATH (attached catherter in right internal jugular);  Surgeon: Virl Cagey, MD;  Location: [facility];  Service: General;  Laterality: Right;    SOCIAL HISTORY:  Social History   Socioeconomic History  . Marital status: Single    Spouse name: Not on file  . Number of children: Not on file  . Years of education: Not on file  . Highest education level: Not on file  Occupational History  . Occupation: Marine scientist, Insurance underwriter  Tobacco Use  .  Smoking status: Never Smoker  . Smokeless tobacco: Former Systems developer    Types: Secondary school teacher  . Vaping Use: Never used  Substance and Sexual Activity  . Alcohol use: No  . Drug use: No  . Sexual activity: Not Currently  Other Topics Concern  . Not on file  Social History Narrative  . Not on file   Social Determinants of Health   Financial Resource Strain: Low Risk   . Difficulty of Paying Living Expenses: Not hard at all  Food Insecurity: No Food Insecurity  .  Worried About Charity fundraiser in the Last Year: Never true  . Ran Out of Food in the Last Year: Never true  Transportation Needs: No Transportation Needs  . Lack of Transportation (Medical): No  . Lack of Transportation (Non-Medical): No  Physical Activity: Inactive  . Days of Exercise per Week: 0 days  . Minutes of Exercise per Session: 0 min  Stress: No Stress Concern Present  . Feeling of Stress : Not at all  Social Connections: Moderately Integrated  . Frequency of Communication with Friends and Family: More than three times a week  . Frequency of Social Gatherings with Friends and Family: More than three times a week  . Attends Religious Services: More than 4 times per year  . Active Member of Clubs or Organizations: Yes  . Attends Archivist Meetings: More than 4 times per year  . Marital Status: Never married  Intimate Partner Violence: Not At Risk  . Fear of Current or Ex-Partner: No  . Emotionally Abused: No  . Physically Abused: No  . Sexually Abused: No    FAMILY HISTORY:  Family History  Problem Relation Age of Onset  . Heart failure Mother 72  . Dementia Father   . Colon cancer Neg Hx     CURRENT MEDICATIONS:  Current Outpatient Medications  Medication Sig Dispense Refill  . acetaminophen (TYLENOL) 325 MG tablet Take 650 mg by mouth every 6 (six) hours as needed.    [name] acyclovir (ZOVIRAX) 200 MG capsule Take 1 capsule (200 mg total) by mouth 2 (two) times daily. 60 capsule 3  . alfuzosin (UROXATRAL) 10 MG 24 hr tablet Take 1 tablet (10 mg total) by mouth daily with breakfast. 30 tablet 11  . allopurinol (ZYLOPRIM) 100 MG tablet Take 2 tablets (200 mg total) by mouth every morning. 60 tablet 0  . apixaban (ELIQUIS) 2.5 MG TABS tablet Take 1 tablet (2.5 mg total) by mouth daily. Start taking from tomorrow 10/06/2020    . B-D UF III MINI PEN NEEDLES 31G X 5 MM MISC   4  . cyclobenzaprine (FLEXERIL) 10 MG tablet TAKE 1 TABLET THREE TIMES DAILY AS NEEDED  FOR MUSCLE SPASM 30 tablet 0  . dexamethasone (DECADRON) 4 MG tablet Take 5 tablets (20 mg total) by mouth once a week. 20 tablet 3  . dicyclomine (BENTYL) 10 MG capsule Take 10 mg by mouth 2 (two) times daily as needed.     . diphenoxylate-atropine (LOMOTIL) 2.5-0.025 MG tablet Take 2 tablets by mouth 4 (four) times daily as needed.     . ergocalciferol (VITAMIN D2) 1.25 MG (50000 UT) capsule Take by mouth.    . furosemide (LASIX) 40 MG tablet Take 1.5 tablets (60 mg total) by mouth daily. 45 tablet 1  . gabapentin (NEURONTIN) 300 MG capsule Take 1 capsule (300 mg total) by mouth at bedtime.    [name] glipiZIDE (GLUCOTROL XL) 10 MG 24  hr tablet TAKE (1) TABLET BY MOUTH DAILY WITH BREAKFAST. 30 tablet 0  . lanthanum (FOSRENOL) 1000 MG chewable tablet     . lisinopril (ZESTRIL) 5 MG tablet Take 1 tablet by mouth daily.    . Multiple Vitamins-Minerals (CENTRUM SILVER 50+MEN) TABS Take 1 tablet by mouth every morning.    [name] NOVOLOG FLEXPEN 100 UNIT/ML FlexPen Inject 8 Units into the skin 3 (three) times daily with meals.     . pomalidomide (POMALYST) 2 MG capsule     . pravastatin (PRAVACHOL) 20 MG tablet Take 20 mg by mouth at bedtime.     . selinexor (XPOVIO) 40 MG TBPK Take 40 mg by mouth once a week. 4 each 0  . sodium bicarbonate 650 MG tablet Take by mouth.    . traZODone (DESYREL) 100 MG tablet TAKE ONE TABLET BY MOUTH AT BEDTIME. 30 tablet 0  . TRESIBA FLEXTOUCH 200 UNIT/ML FlexTouch Pen Inject 16 Units into the skin daily.    . TRUE METRIX BLOOD GLUCOSE TEST test strip      No current facility-administered medications for this visit.   Facility-Administered Medications Ordered in Other Visits  Medication Dose Route Frequency Provider Last Rate Last Admin  . 0.9 %  sodium chloride infusion  250 mL Intravenous Once Twana First, MD        ALLERGIES:  No Known Allergies  PHYSICAL EXAM:  Performance status (ECOG): 1 - Symptomatic but completely ambulatory  Vitals:   11/23/20 1321  BP:  (!) 109/52  Pulse: (!) 106  Resp: 20  Temp: (!) 97.5 F (36.4 C)  SpO2: 96%   Wt Readings from Last 3 Encounters:  11/23/20 225 lb 14.4 oz (102.5 kg)  11/09/20 226 lb 3.2 oz (102.6 kg)  11/02/20 229 lb 12.8 oz (104.2 kg)   Physical Exam Vitals reviewed.  Constitutional:      Appearance: Normal appearance. He is obese.  Cardiovascular:     Rate and Rhythm: Normal rate and regular rhythm.     Pulses: Normal pulses.     Heart sounds: Normal heart sounds.  Pulmonary:     Effort: Pulmonary effort is normal.     Breath sounds: Normal breath sounds.  Musculoskeletal:     Lumbar back: No tenderness.     Right lower leg: Edema (trace) present.     Left lower leg: Edema (trace) present.  Neurological:     General: No focal deficit present.     Mental Status: He is alert and oriented to person, place, and time.  Psychiatric:        Mood and Affect: Mood normal.        Behavior: Behavior normal.     LABORATORY DATA:  I have reviewed the labs as listed.  CBC Latest Ref Rng & Units 11/23/2020 11/09/2020 11/02/2020  WBC 4.0 - 10.5 K/uL 4.0 4.2 3.1(L)  Hemoglobin 13.0 - 17.0 g/dL 6.2(LL) 7.3(L) 7.9(L)  Hematocrit 39.0 - 52.0 % 18.9(L) 23.1(L) 24.7(L)  Platelets 150 - 400 K/uL 28(LL) 29(LL) 25(LL)   CMP Latest Ref Rng & Units 11/23/2020 11/09/2020 11/02/2020  Glucose 70 - 99 mg/dL 176(H) 202(H) 144(H)  BUN 8 - 23 mg/dL 31(H) 34(H) 28(H)  Creatinine 0.61 - 1.24 mg/dL 5.85(H) 5.96(H) 5.31(H)  Sodium 135 - 145 mmol/L 138 138 139  Potassium 3.5 - 5.1 mmol/L 3.4(L) 3.9 4.5  Chloride 98 - 111 mmol/L 96(L) 95(L) 99  CO2 22 - 32 mmol/L '30 29 28  ' Calcium 8.9 - 10.3 mg/dL 9.5  9.1 9.0  Total Protein 6.5 - 8.1 g/dL 6.1(L) 6.5 6.4(L)  Total Bilirubin 0.3 - 1.2 mg/dL 0.5 0.7 0.7  Alkaline Phos 38 - 126 U/L 68 57 60  AST 15 - 41 U/L 19 14(L) 17  ALT 0 - 44 U/L '22 19 25      ' Component Value Date/Time   RBC 1.84 (L) 11/23/2020 1416   MCV 102.7 (H) 11/23/2020 1416   MCH 33.7 11/23/2020 1416    MCHC 32.8 11/23/2020 1416   RDW 20.1 (H) 11/23/2020 1416   LYMPHSABS 0.7 11/23/2020 1416   MONOABS 0.3 11/23/2020 1416   EOSABS 0.0 11/23/2020 1416   BASOSABS 0.0 11/23/2020 1416    DIAGNOSTIC IMAGING:  I have independently reviewed the scans and discussed with the patient. No results found.   ASSESSMENT:  1. IgG kappa multiple myeloma: -4 cycles of RVD from 08/30/2017 through 02/18/2018. -Declined bone marrow transplant. Received maintenance Revlimid 2.5 mg 3 weeks on 1 week off. -Bone marrow biopsy on 02/23/2020 shows 21% plasma cells. Occasional sections had up to 75% myeloma cells. -Chromosome analysis was normal. FISH panel was positive for gain of 1 q., monosomy 13, del 17 P, t(14;20) -PET scan showed new left frontal destructive lesion. Numerous areas of lytic changes in the spine. Lucent areas in T6 with loss of height at T5. Numerous lytic changes throughout the spine noted along with pedicle and lamina and transverse process of T8 vertebral body. Profound hypermetabolic activity within the ribs bilaterally. -Daratumumab, pomalidomide and dexamethasone from 04/11/2020 through 09/05/2020 with progression. -Velcade, selinexor and dexamethasone from 10/01/2020 through 11/09/2020 with progression. -Bone marrow biopsy on 11/18/2020 at [facility] showed 95% plasma cells. -KCyD started on 11/23/2020.  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 -Completed XRT on 04/05/2020. -MRI thoracic spine on 07/26/2020 showed significant overall improvement in multiple myeloma with much of the bone marrow has returned to normal fatty marrow. Ventral epidural tumor on the right of T5 near completely resolved. Proximal right T7 rib lesion also  resolved.  3. 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 He is on Eliquis.   PLAN:  1. IgG kappa multiple myeloma: -He was evaluated by Dr. Aris Lot at [facility] and a bone marrow biopsy showed 95% plasma cells. -Change in regimen to carfilzomib, cyclophosphamide and dexamethasone was recommended. -He has received 1 unit of PRBC at [facility] this Monday for hemoglobin 6.6. -We discussed the new regimen in detail.  We discussed side effects also in detail. -We have arranged for a baseline echocardiogram this Friday. -He will proceed with cycle 1 day 1 today with low-dose carfilzomib. -He has thrombocytopenia with platelets of 28 and hemoglobin 6.2 secondary to bone marrow infiltration. -He will receive 2 units of PRBC on Friday. -RTC 1 week for follow-up.  2.ESRD on HD: -Continue hemodialysis on Tuesday, Thursday and Saturday.  3. Left leg DVT: -He is on Eliquis 2.5 mg daily which is on hold as his platelet count dropped.  4.Diabetes: -Continue Tresiba and NovoLog.  5. Insomnia: -Continue trazodone 100 mg at bedtime.    Orders placed this encounter:  Orders Placed This Encounter  Procedures  . CBC with Differential  . Comprehensive metabolic panel  . PHYSICIAN COMMUNICATION ORDER  . Sample to Blood  Bank(Blood Bank Hold)   Total time spent is 40 minutes with more than 50% of the time spent face-to-face reviewing outside hospital records, discussing treatment recommendations, side effects, counseling and coordination of care.  Derek Jack, MD [place] [phone]   I, Milinda Antis, am acting as a scribe for Dr. Sanda Linger.  I, Derek Jack MD, have reviewed the above documentation for accuracy and completeness, and I agree with the above.

## 2020-11-24 DIAGNOSIS — D649 Anemia, unspecified: Secondary | ICD-10-CM | POA: Diagnosis not present

## 2020-11-24 DIAGNOSIS — Z992 Dependence on renal dialysis: Secondary | ICD-10-CM | POA: Diagnosis not present

## 2020-11-24 DIAGNOSIS — N2581 Secondary hyperparathyroidism of renal origin: Secondary | ICD-10-CM | POA: Diagnosis not present

## 2020-11-24 DIAGNOSIS — N178 Other acute kidney failure: Secondary | ICD-10-CM | POA: Diagnosis not present

## 2020-11-24 DIAGNOSIS — Z23 Encounter for immunization: Secondary | ICD-10-CM | POA: Diagnosis not present

## 2020-11-24 DIAGNOSIS — D509 Iron deficiency anemia, unspecified: Secondary | ICD-10-CM | POA: Diagnosis not present

## 2020-11-24 DIAGNOSIS — N179 Acute kidney failure, unspecified: Secondary | ICD-10-CM | POA: Diagnosis not present

## 2020-11-24 LAB — SAMPLE TO BLOOD BANK

## 2020-11-25 ENCOUNTER — Ambulatory Visit (HOSPITAL_COMMUNITY): Payer: Medicare Other

## 2020-11-25 ENCOUNTER — Inpatient Hospital Stay (HOSPITAL_COMMUNITY): Payer: Medicare Other

## 2020-11-25 ENCOUNTER — Other Ambulatory Visit: Payer: Self-pay

## 2020-11-25 DIAGNOSIS — E119 Type 2 diabetes mellitus without complications: Secondary | ICD-10-CM | POA: Diagnosis not present

## 2020-11-25 DIAGNOSIS — C9000 Multiple myeloma not having achieved remission: Secondary | ICD-10-CM | POA: Diagnosis not present

## 2020-11-25 DIAGNOSIS — Z86718 Personal history of other venous thrombosis and embolism: Secondary | ICD-10-CM | POA: Diagnosis not present

## 2020-11-25 DIAGNOSIS — D649 Anemia, unspecified: Secondary | ICD-10-CM

## 2020-11-25 DIAGNOSIS — D696 Thrombocytopenia, unspecified: Secondary | ICD-10-CM | POA: Diagnosis not present

## 2020-11-25 DIAGNOSIS — Z7901 Long term (current) use of anticoagulants: Secondary | ICD-10-CM | POA: Diagnosis not present

## 2020-11-25 DIAGNOSIS — Z5112 Encounter for antineoplastic immunotherapy: Secondary | ICD-10-CM | POA: Diagnosis not present

## 2020-11-25 MED ORDER — ACETAMINOPHEN 325 MG PO TABS
650.0000 mg | ORAL_TABLET | Freq: Once | ORAL | Status: AC
  Administered 2020-11-25: 650 mg via ORAL
  Filled 2020-11-25: qty 2

## 2020-11-25 MED ORDER — SODIUM CHLORIDE 0.9% FLUSH
10.0000 mL | INTRAVENOUS | Status: AC | PRN
  Administered 2020-11-25: 10 mL

## 2020-11-25 MED ORDER — DIPHENHYDRAMINE HCL 25 MG PO CAPS
25.0000 mg | ORAL_CAPSULE | Freq: Once | ORAL | Status: AC
  Administered 2020-11-25: 25 mg via ORAL
  Filled 2020-11-25: qty 1

## 2020-11-25 MED ORDER — SODIUM CHLORIDE 0.9% IV SOLUTION
250.0000 mL | Freq: Once | INTRAVENOUS | Status: AC
  Administered 2020-11-25: 250 mL via INTRAVENOUS

## 2020-11-25 MED ORDER — HEPARIN SOD (PORK) LOCK FLUSH 100 UNIT/ML IV SOLN
500.0000 [IU] | Freq: Every day | INTRAVENOUS | Status: AC | PRN
  Administered 2020-11-25: 500 [IU]

## 2020-11-25 NOTE — Progress Notes (Signed)
Patient presents today for [facility].  Patient states that he is short of breath with exertion but feels okay.  Vital signs WNL.  Blood given today per MD orders.  Tolerated infusion without adverse affects.  Vital signs stable.  No complaints at this time.  Discharge from clinic ambulatory in stable condition.  Alert and oriented X 3.  Follow up with [facility] as scheduled.

## 2020-11-25 NOTE — Progress Notes (Signed)
UNMATCHED BLOOD PRODUCT NOTE  Compare the patient ID on the blood tag to the patient ID on the hospital armband and Blood Bank armband. Then confirm the unit number on the blood tag matches the unit number on the blood product.  If a discrepancy is discovered return the product to blood bank immediately.  Blood verified by 2 nurses: Flint Melter  Blood Product Type: Packed Red Blood Cells  Unit #: [id number]  Product Code #: (Found on blood product bag, begins with E) [id number]   Start Time: 1287  Starting Rate:120ml/hr  Rate increase/decreased  (if applicable):     867 ml/hr  Rate changed time (if applicable): 6720  Rate increase:1010  Rate change time:300  Stop Time: 9470   All Other Documentation should be documented within the Blood Admin Flowsheet per policy.

## 2020-11-25 NOTE — Patient Instructions (Signed)
[facility] Discharge Instructions for Patients Receiving Chemotherapy  Today you received the following chemotherapy agents   To help prevent nausea and vomiting after your treatment, we encourage you to take your nausea medication   If you develop nausea and vomiting that is not controlled by your nausea medication, call the clinic.   BELOW ARE SYMPTOMS THAT SHOULD BE REPORTED IMMEDIATELY:  *FEVER GREATER THAN 100.5 F  *CHILLS WITH OR WITHOUT FEVER  NAUSEA AND VOMITING THAT IS NOT CONTROLLED WITH YOUR NAUSEA MEDICATION  *UNUSUAL SHORTNESS OF BREATH  *UNUSUAL BRUISING OR BLEEDING  TENDERNESS IN MOUTH AND THROAT WITH OR WITHOUT PRESENCE OF ULCERS  *URINARY PROBLEMS  *BOWEL PROBLEMS  UNUSUAL RASH Items with * indicate a potential emergency and should be followed up as soon as possible.  Feel free to call the clinic should you have any questions or concerns. The clinic phone number is [phone].  Please show the CHEMO ALERT CARD at check-in to the Emergency Department and triage nurse.   

## 2020-11-26 DIAGNOSIS — D509 Iron deficiency anemia, unspecified: Secondary | ICD-10-CM | POA: Diagnosis not present

## 2020-11-26 DIAGNOSIS — N2581 Secondary hyperparathyroidism of renal origin: Secondary | ICD-10-CM | POA: Diagnosis not present

## 2020-11-26 DIAGNOSIS — Z992 Dependence on renal dialysis: Secondary | ICD-10-CM | POA: Diagnosis not present

## 2020-11-26 DIAGNOSIS — D649 Anemia, unspecified: Secondary | ICD-10-CM | POA: Diagnosis not present

## 2020-11-26 DIAGNOSIS — N178 Other acute kidney failure: Secondary | ICD-10-CM | POA: Diagnosis not present

## 2020-11-26 DIAGNOSIS — Z23 Encounter for immunization: Secondary | ICD-10-CM | POA: Diagnosis not present

## 2020-11-26 LAB — BPAM RBC
Blood Product Expiration Date: 202201302359
Blood Product Expiration Date: 202202282359
ISSUE DATE / TIME: 202201280236
ISSUE DATE / TIME: 202201280928
Unit Type and Rh: 6200
Unit Type and Rh: 6200

## 2020-11-26 LAB — TYPE AND SCREEN
ABO/RH(D): A POS
Antibody Screen: POSITIVE
Unit division: 0
Unit division: 0

## 2020-11-28 ENCOUNTER — Ambulatory Visit (HOSPITAL_COMMUNITY)
Admission: RE | Admit: 2020-11-28 | Discharge: 2020-11-28 | Disposition: A | Discharge status: Home / Self Care | Payer: Medicare Other | Source: Ambulatory Visit | Attending: Hematology | Admitting: Hematology

## 2020-11-28 ENCOUNTER — Other Ambulatory Visit: Payer: Self-pay

## 2020-11-28 DIAGNOSIS — C9000 Multiple myeloma not having achieved remission: Secondary | ICD-10-CM | POA: Diagnosis not present

## 2020-11-28 DIAGNOSIS — I358 Other nonrheumatic aortic valve disorders: Secondary | ICD-10-CM | POA: Insufficient documentation

## 2020-11-28 DIAGNOSIS — Z0189 Encounter for other specified special examinations: Secondary | ICD-10-CM | POA: Diagnosis not present

## 2020-11-28 DIAGNOSIS — Z01818 Encounter for other preprocedural examination: Secondary | ICD-10-CM | POA: Diagnosis not present

## 2020-11-28 DIAGNOSIS — E1122 Type 2 diabetes mellitus with diabetic chronic kidney disease: Secondary | ICD-10-CM | POA: Insufficient documentation

## 2020-11-28 DIAGNOSIS — N189 Chronic kidney disease, unspecified: Secondary | ICD-10-CM | POA: Diagnosis not present

## 2020-11-28 DIAGNOSIS — E785 Hyperlipidemia, unspecified: Secondary | ICD-10-CM | POA: Insufficient documentation

## 2020-11-28 LAB — ECHOCARDIOGRAM COMPLETE
AR max vel: 1.85 cm2
AV Area VTI: 1.94 cm2
AV Area mean vel: 1.97 cm2
AV Mean grad: 11 mmHg
AV Peak grad: 20.4 mmHg
Ao pk vel: 2.26 m/s
Area-P 1/2: 3.76 cm2
S' Lateral: 2.7 cm

## 2020-11-28 NOTE — Progress Notes (Signed)
*  PRELIMINARY RESULTS* Echocardiogram 2D Echocardiogram has been performed.  [name] 11/28/2020, 11:36 AM

## 2020-11-29 DIAGNOSIS — N178 Other acute kidney failure: Secondary | ICD-10-CM | POA: Diagnosis not present

## 2020-11-29 DIAGNOSIS — Z23 Encounter for immunization: Secondary | ICD-10-CM | POA: Diagnosis not present

## 2020-11-29 DIAGNOSIS — Z992 Dependence on renal dialysis: Secondary | ICD-10-CM | POA: Diagnosis not present

## 2020-11-29 DIAGNOSIS — N2581 Secondary hyperparathyroidism of renal origin: Secondary | ICD-10-CM | POA: Diagnosis not present

## 2020-11-29 DIAGNOSIS — D649 Anemia, unspecified: Secondary | ICD-10-CM | POA: Diagnosis not present

## 2020-11-29 NOTE — Progress Notes (Signed)
CRITICAL VALUE ALERT  Critical Value:  hgb 6.1  Date & Time Notied:  11/23/2020 at 1456  Provider Notified: Dr. Delton Coombes  Orders Received/Actions taken: transfuse 2 units PRBC

## 2020-11-30 ENCOUNTER — Ambulatory Visit (HOSPITAL_COMMUNITY)
Admission: RE | Admit: 2020-11-30 | Discharge: 2020-11-30 | Disposition: A | Discharge status: Home / Self Care | Payer: Medicare Other | Source: Ambulatory Visit | Attending: Hematology | Admitting: Hematology

## 2020-11-30 ENCOUNTER — Inpatient Hospital Stay (HOSPITAL_COMMUNITY): Payer: Medicare Other

## 2020-11-30 ENCOUNTER — Inpatient Hospital Stay (HOSPITAL_BASED_OUTPATIENT_CLINIC_OR_DEPARTMENT_OTHER): Payer: Medicare Other | Admitting: Hematology

## 2020-11-30 ENCOUNTER — Other Ambulatory Visit: Payer: Self-pay

## 2020-11-30 ENCOUNTER — Inpatient Hospital Stay (HOSPITAL_COMMUNITY): Payer: Medicare Other | Attending: Hematology

## 2020-11-30 ENCOUNTER — Inpatient Hospital Stay (HOSPITAL_COMMUNITY): Payer: Medicare Other | Admitting: Hematology

## 2020-11-30 VITALS — BP 127/56 | HR 101 | Temp 97.2°F | Resp 19 | Wt 215.6 lb

## 2020-11-30 VITALS — BP 122/56 | HR 92 | Temp 97.2°F | Resp 18

## 2020-11-30 DIAGNOSIS — Z85528 Personal history of other malignant neoplasm of kidney: Secondary | ICD-10-CM | POA: Diagnosis not present

## 2020-11-30 DIAGNOSIS — E7800 Pure hypercholesterolemia, unspecified: Secondary | ICD-10-CM | POA: Diagnosis not present

## 2020-11-30 DIAGNOSIS — E119 Type 2 diabetes mellitus without complications: Secondary | ICD-10-CM | POA: Diagnosis not present

## 2020-11-30 DIAGNOSIS — E785 Hyperlipidemia, unspecified: Secondary | ICD-10-CM | POA: Insufficient documentation

## 2020-11-30 DIAGNOSIS — C9000 Multiple myeloma not having achieved remission: Secondary | ICD-10-CM

## 2020-11-30 DIAGNOSIS — Z79899 Other long term (current) drug therapy: Secondary | ICD-10-CM | POA: Diagnosis not present

## 2020-11-30 DIAGNOSIS — Z992 Dependence on renal dialysis: Secondary | ICD-10-CM | POA: Diagnosis not present

## 2020-11-30 DIAGNOSIS — Z8249 Family history of ischemic heart disease and other diseases of the circulatory system: Secondary | ICD-10-CM | POA: Insufficient documentation

## 2020-11-30 DIAGNOSIS — N186 End stage renal disease: Secondary | ICD-10-CM | POA: Insufficient documentation

## 2020-11-30 DIAGNOSIS — Z86718 Personal history of other venous thrombosis and embolism: Secondary | ICD-10-CM | POA: Diagnosis not present

## 2020-11-30 DIAGNOSIS — M48061 Spinal stenosis, lumbar region without neurogenic claudication: Secondary | ICD-10-CM | POA: Diagnosis not present

## 2020-11-30 DIAGNOSIS — Z923 Personal history of irradiation: Secondary | ICD-10-CM | POA: Diagnosis not present

## 2020-11-30 DIAGNOSIS — Z7901 Long term (current) use of anticoagulants: Secondary | ICD-10-CM | POA: Insufficient documentation

## 2020-11-30 DIAGNOSIS — Z5112 Encounter for antineoplastic immunotherapy: Secondary | ICD-10-CM | POA: Insufficient documentation

## 2020-11-30 DIAGNOSIS — Z5111 Encounter for antineoplastic chemotherapy: Secondary | ICD-10-CM | POA: Insufficient documentation

## 2020-11-30 DIAGNOSIS — Z7952 Long term (current) use of systemic steroids: Secondary | ICD-10-CM | POA: Insufficient documentation

## 2020-11-30 DIAGNOSIS — Z9049 Acquired absence of other specified parts of digestive tract: Secondary | ICD-10-CM | POA: Diagnosis not present

## 2020-11-30 DIAGNOSIS — M47816 Spondylosis without myelopathy or radiculopathy, lumbar region: Secondary | ICD-10-CM | POA: Diagnosis not present

## 2020-11-30 DIAGNOSIS — M5126 Other intervertebral disc displacement, lumbar region: Secondary | ICD-10-CM | POA: Diagnosis not present

## 2020-11-30 DIAGNOSIS — G4700 Insomnia, unspecified: Secondary | ICD-10-CM | POA: Diagnosis not present

## 2020-11-30 DIAGNOSIS — C9002 Multiple myeloma in relapse: Secondary | ICD-10-CM | POA: Insufficient documentation

## 2020-11-30 DIAGNOSIS — Z905 Acquired absence of kidney: Secondary | ICD-10-CM | POA: Insufficient documentation

## 2020-11-30 LAB — COMPREHENSIVE METABOLIC PANEL
ALT: 19 U/L (ref 0–44)
AST: 14 U/L — ABNORMAL LOW (ref 15–41)
Albumin: 3.4 g/dL — ABNORMAL LOW (ref 3.5–5.0)
Alkaline Phosphatase: 72 U/L (ref 38–126)
Anion gap: 12 (ref 5–15)
BUN: 28 mg/dL — ABNORMAL HIGH (ref 8–23)
CO2: 29 mmol/L (ref 22–32)
Calcium: 9.6 mg/dL (ref 8.9–10.3)
Chloride: 94 mmol/L — ABNORMAL LOW (ref 98–111)
Creatinine, Ser: 5.51 mg/dL — ABNORMAL HIGH (ref 0.61–1.24)
GFR, Estimated: 10 mL/min — ABNORMAL LOW (ref >60)
Glucose, Bld: 133 mg/dL — ABNORMAL HIGH (ref 70–99)
Potassium: 3.5 mmol/L (ref 3.5–5.1)
Sodium: 135 mmol/L (ref 135–145)
Total Bilirubin: 1 mg/dL (ref 0.3–1.2)
Total Protein: 6.8 g/dL (ref 6.5–8.1)

## 2020-11-30 LAB — CBC WITH DIFFERENTIAL/PLATELET
Abs Immature Granulocytes: 0.03 10*3/uL (ref 0.00–0.07)
Basophils Absolute: 0 10*3/uL (ref 0.0–0.1)
Basophils Relative: 0 %
Eosinophils Absolute: 0 10*3/uL (ref 0.0–0.5)
Eosinophils Relative: 1 %
HCT: 23.3 % — ABNORMAL LOW (ref 39.0–52.0)
Hemoglobin: 7.7 g/dL — ABNORMAL LOW (ref 13.0–17.0)
Immature Granulocytes: 1 %
Lymphocytes Relative: 19 %
Lymphs Abs: 0.7 10*3/uL (ref 0.7–4.0)
MCH: 33 pg (ref 26.0–34.0)
MCHC: 33 g/dL (ref 30.0–36.0)
MCV: 100 fL (ref 80.0–100.0)
Monocytes Absolute: 0.3 10*3/uL (ref 0.1–1.0)
Monocytes Relative: 7 %
Neutro Abs: 2.6 10*3/uL (ref 1.7–7.7)
Neutrophils Relative %: 72 %
Platelets: 32 10*3/uL — ABNORMAL LOW (ref 150–400)
RBC: 2.33 MIL/uL — ABNORMAL LOW (ref 4.22–5.81)
RDW: 17.8 % — ABNORMAL HIGH (ref 11.5–15.5)
WBC: 3.6 10*3/uL — ABNORMAL LOW (ref 4.0–10.5)
nRBC: 0 % (ref 0.0–0.2)

## 2020-11-30 LAB — SAMPLE TO BLOOD BANK

## 2020-11-30 LAB — MAGNESIUM: Magnesium: 2.1 mg/dL (ref 1.7–2.4)

## 2020-11-30 MED ORDER — DEXAMETHASONE SODIUM PHOSPHATE 100 MG/10ML IJ SOLN
40.0000 mg | Freq: Once | INTRAMUSCULAR | Status: AC
  Administered 2020-11-30: 40 mg via INTRAVENOUS
  Filled 2020-11-30: qty 4

## 2020-11-30 MED ORDER — SODIUM CHLORIDE 0.9 % IV SOLN
Freq: Once | INTRAVENOUS | Status: AC
  Administered 2020-11-30: 8 mg via INTRAVENOUS
  Filled 2020-11-30: qty 4

## 2020-11-30 MED ORDER — SODIUM CHLORIDE 0.9 % IV SOLN: Freq: Once | INTRAVENOUS | Status: AC

## 2020-11-30 MED ORDER — HEPARIN SOD (PORK) LOCK FLUSH 100 UNIT/ML IV SOLN
500.0000 [IU] | Freq: Once | INTRAVENOUS | Status: AC | PRN
  Administered 2020-11-30: 500 [IU]

## 2020-11-30 MED ORDER — DEXTROSE 5 % IV SOLN
56.0000 mg/m2 | Freq: Once | INTRAVENOUS | Status: AC
  Administered 2020-11-30: 120 mg via INTRAVENOUS
  Filled 2020-11-30: qty 60

## 2020-11-30 MED ORDER — SODIUM CHLORIDE 0.9% FLUSH
10.0000 mL | INTRAVENOUS | Status: DC | PRN
  Administered 2020-11-30: 10 mL

## 2020-11-30 MED ORDER — SODIUM CHLORIDE 0.9 % IV SOLN
300.0000 mg/m2 | Freq: Once | INTRAVENOUS | Status: AC
  Administered 2020-11-30: 660 mg via INTRAVENOUS
  Filled 2020-11-30: qty 33

## 2020-11-30 MED ORDER — ONDANSETRON HCL 4 MG/2ML IJ SOLN: 8.0000 mg | Freq: Once | INTRAMUSCULAR | Status: DC

## 2020-11-30 NOTE — Patient Instructions (Signed)
[place] at [facility] Discharge Instructions  You were seen today by Dr. Delton Coombes. He went over your recent results. You received your treatment today. You will be scheduled for an MRI of your lumbar spine before your next visit. Dr. Delton Coombes will see you back in 1 week for labs and follow up.   Thank you for choosing [place] at [facility] to provide your oncology and hematology care.  To afford each patient quality time with our provider, please arrive at least 15 minutes before your scheduled appointment time.   If you have a lab appointment with the [facility] please come in thru the Main Entrance and check in at the main information desk  You need to re-schedule your appointment should you arrive 10 or more minutes late.  We strive to give you quality time with our providers, and arriving late affects you and other patients whose appointments are after yours.  Also, if you no show three or more times for appointments you may be dismissed from the clinic at the providers discretion.     Again, thank you for choosing [facility].  Our hope is that these requests will decrease the amount of time that you wait before being seen by our physicians.       _____________________________________________________________  Should you have questions after your visit to [facility], please contact our office at [phone] between the hours of 8:00 a.m. and 4:30 p.m.  Voicemails left after 4:00 p.m. will not be returned until the following business day.  For prescription refill requests, have your pharmacy contact our office and allow 72 hours.    Cancer Center Support Programs:   > Cancer Support Group  2nd Tuesday of the month 1pm-2pm, Journey Room

## 2020-11-30 NOTE — Addendum Note (Signed)
Addended by: Wynona Neat on: 11/30/2020 02:49 PM   Modules accepted: Orders

## 2020-11-30 NOTE — Patient Instructions (Signed)
[facility] at [facility] Discharge Instructions  Labs drawn from portacath today   Thank you for choosing  [facility] at [facility] to provide your oncology and hematology care.  To afford each patient quality time with our provider, please arrive at least 15 minutes before your scheduled appointment time.   If you have a lab appointment with the Cancer Center please come in thru the Main Entrance and check in at the main information desk.  You need to re-schedule your appointment should you arrive 10 or more minutes late.  We strive to give you quality time with our providers, and arriving late affects you and other patients whose appointments are after yours.  Also, if you no show three or more times for appointments you may be dismissed from the clinic at the providers discretion.     Again, thank you for choosing [facility].  Our hope is that these requests will decrease the amount of time that you wait before being seen by our physicians.       _____________________________________________________________  Should you have questions after your visit to [facility], please contact our office at [phone] and follow the prompts.  Our office hours are 8:00 a.m. and 4:30 p.m. Monday - Friday.  Please note that voicemails left after 4:00 p.m. may not be returned until the following business day.  We are closed weekends and major holidays.  You do have access to a nurse 24-7, just call the main number to the clinic [phone] and do not press any options, hold on the line and a nurse will answer the phone.    For prescription refill requests, have your pharmacy contact our office and allow 72 hours.    Due to Covid, you will need to wear a mask upon entering the hospital. If you do not have a mask, a mask will be given to you at the Main Entrance upon arrival. For doctor visits, patients may have 1 support person age 18  or older with them. For treatment visits, patients can not have anyone with them due to social distancing guidelines and our immunocompromised population.     

## 2020-11-30 NOTE — Progress Notes (Signed)
Labs reviewed with oncology follow up and ok to treat today verbal order Dr. Delton Coombes.   Patient tolerated chemotherapy with no complaints voiced.  Side effects with management reviewed with understanding verbalized.  Port site clean and dry with no bruising or swelling noted at site.  Good blood return noted before and after administration of chemotherapy.  Band aid applied.  Patient left in satisfactory condition with VSS and no s/s of distress noted.

## 2020-11-30 NOTE — Progress Notes (Signed)
Pt here for D8C1 of cytoxan and kyprolis.  BUN 28, creatinine 5.5. Hemoglobin 7.7, platelets 32.

## 2020-11-30 NOTE — Progress Notes (Signed)
[place], [place] [address]   CLINIC:  Medical Oncology/Hematology  PCP:  Asencion Noble, MD [address] / [place] [address] [phone]   REASON FOR VISIT:  Follow-up for multiple myeloma  PRIOR THERAPY:  1. Revlimid x 4 cycles from 09/05/2017 to 02/18/2018 with Revlimid maintenance. 2. Pomalyst from 06/20/2020 to 09/19/2020. 3. Darzalex x 6 cycles from 04/11/2020 to 09/05/2020. 4. Velcade x 2 cycles from 10/01/2020 to 11/09/2020.  NGS Results: Not done  CURRENT THERAPY: KCyD weekly  BRIEF ONCOLOGIC HISTORY:  Oncology History  Multiple myeloma ([place])  08/29/2017 Initial Diagnosis   Multiple myeloma ([place])   12/10/2017 - 03/11/2018 Chemotherapy   The patient had dexamethasone (DECADRON) 4 MG tablet, 1 of 1 cycle, Start date: --, End date: -- lenalidomide (REVLIMID) 25 MG capsule, 1 of 1 cycle, Start date: --, End date: -- bortezomib SQ (VELCADE) chemo injection 2.75 mg, 1.3 mg/m2 = 2.75 mg, Subcutaneous,  Once, 5 of 5 cycles Administration: 2.75 mg (12/10/2017), 2.75 mg (12/17/2017), 2.75 mg (12/31/2017), 2.75 mg (01/21/2018), 2.75 mg (02/18/2018), 2.75 mg (03/11/2018)  for chemotherapy treatment.    04/11/2020 - 09/05/2020 Chemotherapy   The patient had daratumumab (DARZALEX) 800 mg in sodium chloride 0.9 % 960 mL (0.8 mg/mL) chemo infusion, 8 mg/kg = 800 mg (100 % of original dose 8 mg/kg), Intravenous, Once, 1 of 1 cycle Dose modification: 8 mg/kg (original dose 8 mg/kg, Cycle 1, Reason: Provider Judgment, Comment: giving total dose 24m/kg over 2 days) daratumumab (DARZALEX) 800 mg in sodium chloride 0.9 % 460 mL (1.6 mg/mL) chemo infusion, 8 mg/kg = 800 mg (50 % of original dose 16 mg/kg), Intravenous, Once, 2 of 2 cycles Dose modification: 8 mg/kg (original dose 16 mg/kg, Cycle 1, Reason: Provider Judgment, Comment: giving 16 mg/kg over 2 days. thus 8 mg/kg in 2 divided doses.) Administration: 1,600 mg (04/19/2020), 1,600 mg  (05/03/2020), 1,600 mg (05/09/2020), 800 mg (04/11/2020), 800 mg (04/12/2020) daratumumab (DARZALEX) 1,600 mg in sodium chloride 0.9 % 420 mL chemo infusion, 16 mg/kg = 1,600 mg, Intravenous, Once, 5 of 6 cycles Administration: 1,600 mg (05/16/2020), 1,600 mg (05/23/2020), 1,600 mg (05/30/2020), 1,600 mg (06/06/2020), 1,600 mg (06/13/2020), 1,600 mg (06/27/2020), 1,600 mg (07/11/2020), 1,600 mg (07/25/2020), 1,600 mg (08/08/2020), 1,600 mg (08/22/2020), 1,600 mg (09/05/2020)  for chemotherapy treatment.    09/30/2020 - 11/09/2020 Chemotherapy         11/23/2020 -  Chemotherapy    Patient is on Treatment Plan: MYELOMA RELAPSED/REFRACTORY CARFILZOMIB + DEXAMETHASONE (KD) WEEKLY Q28D        CANCER STAGING: Cancer Staging No matching staging information was found for the patient.  INTERVAL HISTORY:  Mr. WJHASE CREPPEL a 70y.o. male, returns for routine follow-up and consideration for next cycle of chemotherapy. WTiranwas last seen on 11/23/2020.  Due for day #8 of cycle #1 of KCyD today.   Overall, he tells me he has been feeling okay. He tolerated the previous treatment well. He complains of having back pain which has been steadily worsening; the pain is worse when he sits up or down, but denies pain when he is walking or lying on his back. He is using a walker. He denies weakness in his lower legs or incontinence and denies recent falls. He is taking Norco once a day which helps bring the pain down from 8/10 to 7/10. His HD schedule has shifted to Tues/Fri/Sat and he has been making more urine. His appetite is excellent. He  has not restarted Eliquis yet, but he has started [place] to 44 units. He continues taking trazodone for sleep.  Overall, he feels ready for next cycle of chemo today.    REVIEW OF SYSTEMS:  Review of Systems  Constitutional: Positive for fatigue (25%). Negative for appetite change.  Genitourinary: Negative for bladder incontinence.   Musculoskeletal: Positive for back pain  (7/10 lower back pain).  Neurological: Negative for extremity weakness.  All other systems reviewed and are negative.   PAST MEDICAL/SURGICAL HISTORY:  Past Medical History:  Diagnosis Date  . Cancer ([facility] [facility])    multiple myeloma  . Cancer of right kidney ([place])   . Cervical dystonia   . Diabetes mellitus without complication ([place])   . Gout   . Hypercholesteremia    Past Surgical History:  Procedure Laterality Date  . BONE MARROW BIOPSY    . CHOLECYSTECTOMY  2007  . COLONOSCOPY WITH PROPOFOL N/A 01/16/2018   Procedure: COLONOSCOPY WITH PROPOFOL;  Surgeon: Daneil Dolin, MD;  Location: [facility];  Service: Endoscopy;  Laterality: N/A;  1:45pm  . ESOPHAGOGASTRODUODENOSCOPY (EGD) WITH PROPOFOL N/A 01/16/2018   Procedure: ESOPHAGOGASTRODUODENOSCOPY (EGD) WITH PROPOFOL;  Surgeon: Daneil Dolin, MD;  Location: [facility];  Service: Endoscopy;  Laterality: N/A;  . GIVENS CAPSULE STUDY N/A 05/12/2018   Procedure: GIVENS CAPSULE STUDY;  Surgeon: Daneil Dolin, MD;  Location: [facility];  Service: Endoscopy;  Laterality: N/A;  7:30am  . IR FLUORO GUIDE CV LINE LEFT  09/29/2020  . IR FLUORO GUIDE CV LINE LEFT  10/05/2020  . IR US GUIDE VASC ACCESS LEFT  09/29/2020  . IR US GUIDE VASC ACCESS LEFT  10/05/2020  . NEPHRECTOMY Right 1998   cancer  . PORTACATH PLACEMENT Right 04/07/2020   Procedure: INSERTION PORT-A-CATH (attached catherter in right internal jugular);  Surgeon: Virl Cagey, MD;  Location: [facility] OR[facility];  Service: General;  Laterality: Right;    SOCIAL HISTORY:  Social History   Socioeconomic History  . Marital status: Single    Spouse name: Not on file  . Number of children: Not on file  . Years of education: Not on file  . Highest education level: Not on file  Occupational History  . Occupation: Marine scientist, Insurance underwriter  Tobacco Use  . Smoking status: Never Smoker  . Smokeless tobacco: Former Systems developer    Types: Secondary school teacher  . Vaping Use: Never used   Substance and Sexual Activity  . Alcohol use: No  . Drug use: No  . Sexual activity: Not Currently  Other Topics Concern  . Not on file  Social History Narrative  . Not on file   Social Determinants of Health   Financial Resource Strain: Low Risk   . Difficulty of Paying Living Expenses: Not hard at all  Food Insecurity: No Food Insecurity  . Worried About Charity fundraiser in the Last Year: Never true  . Ran Out of Food in the Last Year: Never true  Transportation Needs: No Transportation Needs  . Lack of Transportation (Medical): No  . Lack of Transportation (Non-Medical): No  Physical Activity: Inactive  . Days of Exercise per Week: 0 days  . Minutes of Exercise per Session: 0 min  Stress: No Stress Concern Present  . Feeling of Stress : Not at all  Social Connections: Moderately Integrated  . Frequency of Communication with Friends and Family: More than three times a week  . Frequency of Social Gatherings with Friends and  Family: More than three times a week  . Attends Religious Services: More than 4 times per year  . Active Member of Clubs or Organizations: Yes  . Attends Archivist Meetings: More than 4 times per year  . Marital Status: Never married  Intimate Partner Violence: Not At Risk  . Fear of Current or Ex-Partner: No  . Emotionally Abused: No  . Physically Abused: No  . Sexually Abused: No    FAMILY HISTORY:  Family History  Problem Relation Age of Onset  . Heart failure Mother 37  . Dementia Father   . Colon cancer Neg Hx     CURRENT MEDICATIONS:  Current Outpatient Medications  Medication Sig Dispense Refill  . acetaminophen (TYLENOL) 325 MG tablet Take 650 mg by mouth every 6 (six) hours as needed.    [name] acyclovir (ZOVIRAX) 200 MG capsule Take 1 capsule (200 mg total) by mouth 2 (two) times daily. 60 capsule 3  . alfuzosin (UROXATRAL) 10 MG 24 hr tablet Take 1 tablet (10 mg total) by mouth daily with breakfast. 30 tablet 11  .  allopurinol (ZYLOPRIM) 100 MG tablet Take 2 tablets (200 mg total) by mouth every morning. 60 tablet 0  . apixaban (ELIQUIS) 2.5 MG TABS tablet Take 1 tablet (2.5 mg total) by mouth daily. Start taking from tomorrow 10/06/2020    . B-D UF III MINI PEN NEEDLES 31G X 5 MM MISC   4  . cyclobenzaprine (FLEXERIL) 10 MG tablet TAKE 1 TABLET THREE TIMES DAILY AS NEEDED FOR MUSCLE SPASM 30 tablet 0  . dexamethasone (DECADRON) 4 MG tablet Take 5 tablets (20 mg total) by mouth once a week. 20 tablet 3  . dicyclomine (BENTYL) 10 MG capsule Take 10 mg by mouth 2 (two) times daily as needed.     . diphenoxylate-atropine (LOMOTIL) 2.5-0.025 MG tablet Take 2 tablets by mouth 4 (four) times daily as needed.     . ergocalciferol (VITAMIN D2) 1.25 MG (50000 UT) capsule Take by mouth.    . furosemide (LASIX) 40 MG tablet Take 1.5 tablets (60 mg total) by mouth daily. 45 tablet 1  . gabapentin (NEURONTIN) 300 MG capsule Take 1 capsule (300 mg total) by mouth at bedtime.    [name] glipiZIDE (GLUCOTROL XL) 10 MG 24 hr tablet TAKE (1) TABLET BY MOUTH DAILY WITH BREAKFAST. 30 tablet 0  . HYDROcodone-acetaminophen (NORCO) 10-325 MG tablet Take 1 tablet by mouth 2 (two) times daily as needed. 60 tablet 0  . lanthanum (FOSRENOL) 1000 MG chewable tablet     . lisinopril (ZESTRIL) 5 MG tablet Take 1 tablet by mouth daily.    . Multiple Vitamins-Minerals (CENTRUM SILVER 50+MEN) TABS Take 1 tablet by mouth every morning.    [name] NOVOLOG FLEXPEN 100 UNIT/ML FlexPen Inject 8 Units into the skin 3 (three) times daily with meals.     . pomalidomide (POMALYST) 2 MG capsule     . pravastatin (PRAVACHOL) 20 MG tablet Take 20 mg by mouth at bedtime.     . selinexor (XPOVIO) 40 MG TBPK Take 40 mg by mouth once a week. 4 each 0  . sodium bicarbonate 650 MG tablet Take by mouth.    . traZODone (DESYREL) 100 MG tablet TAKE ONE TABLET BY MOUTH AT BEDTIME. 30 tablet 0  . TRESIBA FLEXTOUCH 200 UNIT/ML FlexTouch Pen Inject 16 Units into the skin  daily.    . TRUE METRIX BLOOD GLUCOSE TEST test strip     . VELPHORO 500  MG chewable tablet Chew 1,000 mg by mouth 3 (three) times daily.     No current facility-administered medications for this visit.   Facility-Administered Medications Ordered in Other Visits  Medication Dose Route Frequency Provider Last Rate Last Admin  . 0.9 %  sodium chloride infusion  250 mL Intravenous Once Twana First, MD        ALLERGIES:  No Known Allergies  PHYSICAL EXAM:  Performance status (ECOG): 1 - Symptomatic but completely ambulatory  Vitals:   11/30/20 0926  BP: (!) 127/56  Pulse: (!) 101  Resp: 19  Temp: (!) 97.2 F (36.2 C)  SpO2: 96%   Wt Readings from Last 3 Encounters:  11/30/20 215 lb 9.8 oz (97.8 kg)  11/25/20 224 lb 12.8 oz (102 kg)  11/23/20 225 lb 14.4 oz (102.5 kg)   Physical Exam Vitals reviewed.  Constitutional:      Appearance: Normal appearance. He is obese.  Cardiovascular:     Rate and Rhythm: Normal rate and regular rhythm.     Pulses: Normal pulses.     Heart sounds: Normal heart sounds.  Pulmonary:     Effort: Pulmonary effort is normal.     Breath sounds: Normal breath sounds.  Musculoskeletal:     Lumbar back: Tenderness (under L CVA) and bony tenderness (along lower lumbar spine) present.     Right lower leg: No edema.     Left lower leg: No edema.  Neurological:     General: No focal deficit present.     Mental Status: He is alert and oriented to person, place, and time.  Psychiatric:        Mood and Affect: Mood normal.        Behavior: Behavior normal.     LABORATORY DATA:  I have reviewed the labs as listed.  CBC Latest Ref Rng & Units 11/30/2020 11/23/2020 11/09/2020  WBC 4.0 - 10.5 K/uL 3.6(L) 4.0 4.2  Hemoglobin 13.0 - 17.0 g/dL 7.7(L) 6.2(LL) 7.3(L)  Hematocrit 39.0 - 52.0 % 23.3(L) 18.9(L) 23.1(L)  Platelets 150 - 400 K/uL 32(L) 28(LL) 29(LL)   CMP Latest Ref Rng & Units 11/30/2020 11/23/2020 11/09/2020  Glucose 70 - 99 mg/dL 133(H) 176(H)  202(H)  BUN 8 - 23 mg/dL 28(H) 31(H) 34(H)  Creatinine 0.61 - 1.24 mg/dL 5.51(H) 5.85(H) 5.96(H)  Sodium 135 - 145 mmol/L 135 138 138  Potassium 3.5 - 5.1 mmol/L 3.5 3.4(L) 3.9  Chloride 98 - 111 mmol/L 94(L) 96(L) 95(L)  CO2 22 - 32 mmol/L '29 30 29  ' Calcium 8.9 - 10.3 mg/dL 9.6 9.5 9.1  Total Protein 6.5 - 8.1 g/dL 6.8 6.1(L) 6.5  Total Bilirubin 0.3 - 1.2 mg/dL 1.0 0.5 0.7  Alkaline Phos 38 - 126 U/L 72 68 57  AST 15 - 41 U/L 14(L) 19 14(L)  ALT 0 - 44 U/L '19 22 19    ' DIAGNOSTIC IMAGING:  I have independently reviewed the scans and discussed with the patient. ECHOCARDIOGRAM COMPLETE  Result Date: 11/28/2020    ECHOCARDIOGRAM REPORT   Patient Name:   Tom Marshall Date of Exam: 11/28/2020 Medical Rec #:  [id number]        Height:       68.0 in Accession #:    [id number]       Weight:       224.8 lb Date of Birth:  [birth year]        BSA:          2.148 m Patient Age:  69 years         BP:           141/68 mmHg Patient Gender: M                HR:           107 bpm. Exam Location:  [name] Procedure: 2D Echo, Cardiac Doppler and Color Doppler Indications:    Chemo Z09  History:        Patient has no prior history of Echocardiogram examinations.                 Risk Factors:Diabetes and Dyslipidemia. History of Cancer-                 Multiple myeloma not having achieved remission, CKD (chronic                 kidney disease).  Sonographer:    Alvino Chapel RCS Referring Phys: [phone] [place]  1. Left ventricular ejection fraction, by estimation, is 65 to 70%. The left ventricle has normal function. The left ventricle has no regional wall motion abnormalities. Left ventricular diastolic parameters were normal.  2. Right ventricular systolic function is normal. The right ventricular size is normal. Tricuspid regurgitation signal is inadequate for assessing PA pressure.  3. The mitral valve is grossly normal. Trivial mitral valve regurgitation.  4. The aortic valve is  tricuspid. Aortic valve regurgitation is not visualized. Mild to moderate aortic valve sclerosis/calcification is present, without any evidence of aortic stenosis. Aortic valve mean gradient measures 11.0 mmHg.  5. The inferior vena cava is normal in size with greater than 50% respiratory variability, suggesting right atrial pressure of 3 mmHg. FINDINGS  Left Ventricle: Left ventricular ejection fraction, by estimation, is 65 to 70%. The left ventricle has normal function. The left ventricle has no regional wall motion abnormalities. The left ventricular internal cavity size was normal in size. There is  borderline left ventricular hypertrophy. Left ventricular diastolic parameters were normal. Right Ventricle: The right ventricular size is normal. No increase in right ventricular wall thickness. Right ventricular systolic function is normal. Tricuspid regurgitation signal is inadequate for assessing PA pressure. Left Atrium: Left atrial size was normal in size. Right Atrium: Right atrial size was normal in size. Pericardium: There is no evidence of pericardial effusion. Mitral Valve: The mitral valve is grossly normal. Trivial mitral valve regurgitation. Tricuspid Valve: The tricuspid valve is grossly normal. Tricuspid valve regurgitation is trivial. Aortic Valve: The aortic valve is tricuspid. There is mild aortic valve annular calcification. Aortic valve regurgitation is not visualized. Mild to moderate aortic valve sclerosis/calcification is present, without any evidence of aortic stenosis. Aortic  valve mean gradient measures 11.0 mmHg. Aortic valve peak gradient measures 20.4 mmHg. Aortic valve area, by VTI measures 1.94 cm. Pulmonic Valve: The pulmonic valve was grossly normal. Pulmonic valve regurgitation is trivial. Aorta: The aortic root is normal in size and structure. Venous: The inferior vena cava is normal in size with greater than 50% respiratory variability, suggesting right atrial pressure of 3  mmHg. IAS/Shunts: No atrial level shunt detected by color flow Doppler.  LEFT VENTRICLE PLAX 2D LVIDd:         5.60 cm  Diastology LVIDs:         2.70 cm  LV e' lateral:   12.30 cm/s LV PW:         1.00 cm  LV E/e' lateral: 10.3 LV IVS:  1.00 cm LVOT diam:     2.00 cm LV SV:         81 LV SV Index:   38 LVOT Area:     3.14 cm  RIGHT VENTRICLE RV S prime:     14.90 cm/s TAPSE (M-mode): 2.3 cm LEFT ATRIUM           Index       RIGHT ATRIUM           Index LA diam:      3.70 cm 1.72 cm/m  RA Area:     16.70 cm LA Vol (A2C): 41.1 ml 19.14 ml/m RA Volume:   42.20 ml  19.65 ml/m LA Vol (A4C): 66.6 ml 31.01 ml/m  AORTIC VALVE AV Area (Vmax):    1.85 cm AV Area (Vmean):   1.97 cm AV Area (VTI):     1.94 cm AV Vmax:           226.00 cm/s AV Vmean:          154.000 cm/s AV VTI:            0.417 m AV Peak Grad:      20.4 mmHg AV Mean Grad:      11.0 mmHg LVOT Vmax:         133.00 cm/s LVOT Vmean:        96.800 cm/s LVOT VTI:          0.258 m LVOT/AV VTI ratio: 0.62  AORTA Ao Root diam: 3.30 cm MITRAL VALVE MV Area (PHT): 3.76 cm     SHUNTS MV Decel Time: 202 msec     Systemic VTI:  0.26 m MV E velocity: 127.00 cm/s  Systemic Diam: 2.00 cm MV A velocity: 163.00 cm/s MV E/A ratio:  0.78 Rozann Lesches MD Electronically signed by Rozann Lesches MD Signature Date/Time: 11/28/2020/11:43:10 AM    Final      ASSESSMENT:  1. IgG kappa multiple myeloma: -4 cycles of RVD from 08/30/2017 through 02/18/2018. -Declined bone marrow transplant. Received maintenance Revlimid 2.5 mg 3 weeks on 1 week off. -Bone marrow biopsy on 02/23/2020 shows 21% plasma cells. Occasional sections had up to 75% myeloma cells. -Chromosome analysis was normal. FISH panel was positive for gain of 1 q., monosomy 13, del 17 P, t(14;20) -PET scan showed new left frontal destructive lesion. Numerous areas of lytic changes in the spine. Lucent areas in T6 with loss of height at T5. Numerous lytic changes throughout the spine noted along  with pedicle and lamina and transverse process of T8 vertebral body. Profound hypermetabolic activity within the ribs bilaterally. -Daratumumab, pomalidomide and dexamethasone from 04/11/2020 through 09/05/2020 with progression. -Velcade, selinexor and dexamethasone from 10/01/2020 through 11/09/2020 with progression. -Bone marrow [facility] on 11/18/2020 at [facility] showed 95% plasma cells. -KCyD started on 11/23/2020. -2D echo on 11/28/2020 with EF 65 to 70%.  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 -Completed XRT on 04/05/2020. -MRI thoracic spine on 07/26/2020 showed significant overall improvement in multiple myeloma with much of the bone marrow has returned to normal fatty marrow. Ventral epidural tumor on the right of T5 near completely resolved. Proximal right T7 rib lesion also resolved.  3. 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 He is on  Eliquis.   PLAN:  1. IgG kappa multiple myeloma: -He tolerated first dose of carfilzomib and cyclophosphamide on 11/23/2020 reasonably well. -Reviewed his echocardiogram results. -Reviewed his blood work which showed white count 3.6 with normal ANC.  However platelet count is 32.  His bone marrow is 95% infiltrated with plasma cells. -Hence will proceed with full dose of cyclophosphamide and carfilzomib at 56 mg/M square. -I have ordered MRI of the back because of his pains. -RTC 1 week for follow-up.  2.ESRD on HD: -Continue dialysis on Monday, Friday and Saturday.  3. Left leg DVT: -Eliquis 2.5 mg daily is on hold as his platelet count is low.  4.Diabetes: -Continue Tresiba and NovoLog.  5. Insomnia: -Continue trazodone 100 mg at bedtime.  6.  Low back pain: -He reported back pain  8/10.  He takes hydrocodone 10 mg as needed.  Pain improves to 7/10. -No leg weakness or falls reported. -I have done MRI as stat.  There is evidence of diffuse multiple myeloma involvement in the visualized thoracolumbar spine and sacrum.  No evidence of pathological fracture.  No specific evidence of epidural/canal involvement.   Orders placed this encounter:  Orders Placed This Encounter  Procedures  . MR Lumbar Spine Wo Contrast     Derek Jack, MD [place] [phone]   I, Milinda Antis, am acting as a scribe for Dr. Sanda Linger.  I, Derek Jack MD, have reviewed the above documentation for accuracy and completeness, and I agree with the above.

## 2020-11-30 NOTE — Patient Instructions (Signed)
[facility] Discharge Instructions for Patients Receiving Chemotherapy  Today you received the following chemotherapy agents   To help prevent nausea and vomiting after your treatment, we encourage you to take your nausea medication   If you develop nausea and vomiting that is not controlled by your nausea medication, call the clinic.   BELOW ARE SYMPTOMS THAT SHOULD BE REPORTED IMMEDIATELY:  *FEVER GREATER THAN 100.5 F  *CHILLS WITH OR WITHOUT FEVER  NAUSEA AND VOMITING THAT IS NOT CONTROLLED WITH YOUR NAUSEA MEDICATION  *UNUSUAL SHORTNESS OF BREATH  *UNUSUAL BRUISING OR BLEEDING  TENDERNESS IN MOUTH AND THROAT WITH OR WITHOUT PRESENCE OF ULCERS  *URINARY PROBLEMS  *BOWEL PROBLEMS  UNUSUAL RASH Items with * indicate a potential emergency and should be followed up as soon as possible.  Feel free to call the clinic should you have any questions or concerns. The clinic phone number is [phone].  Please show the CHEMO ALERT CARD at check-in to the Emergency Department and triage nurse.   

## 2020-11-30 NOTE — Progress Notes (Signed)
Patient was assessed by Dr. Katragadda and labs have been reviewed.  Patient is okay to proceed with treatment today. Primary RN and pharmacy aware.   

## 2020-12-02 DIAGNOSIS — N178 Other acute kidney failure: Secondary | ICD-10-CM | POA: Diagnosis not present

## 2020-12-02 DIAGNOSIS — Z23 Encounter for immunization: Secondary | ICD-10-CM | POA: Diagnosis not present

## 2020-12-02 DIAGNOSIS — Z992 Dependence on renal dialysis: Secondary | ICD-10-CM | POA: Diagnosis not present

## 2020-12-02 DIAGNOSIS — N2581 Secondary hyperparathyroidism of renal origin: Secondary | ICD-10-CM | POA: Diagnosis not present

## 2020-12-02 DIAGNOSIS — D649 Anemia, unspecified: Secondary | ICD-10-CM | POA: Diagnosis not present

## 2020-12-06 DIAGNOSIS — N178 Other acute kidney failure: Secondary | ICD-10-CM | POA: Diagnosis not present

## 2020-12-06 DIAGNOSIS — N2581 Secondary hyperparathyroidism of renal origin: Secondary | ICD-10-CM | POA: Diagnosis not present

## 2020-12-06 DIAGNOSIS — Z23 Encounter for immunization: Secondary | ICD-10-CM | POA: Diagnosis not present

## 2020-12-06 DIAGNOSIS — Z992 Dependence on renal dialysis: Secondary | ICD-10-CM | POA: Diagnosis not present

## 2020-12-06 DIAGNOSIS — D649 Anemia, unspecified: Secondary | ICD-10-CM | POA: Diagnosis not present

## 2020-12-07 ENCOUNTER — Inpatient Hospital Stay (HOSPITAL_COMMUNITY): Payer: Medicare Other

## 2020-12-07 ENCOUNTER — Inpatient Hospital Stay (HOSPITAL_BASED_OUTPATIENT_CLINIC_OR_DEPARTMENT_OTHER): Payer: Medicare Other | Admitting: Hematology

## 2020-12-07 ENCOUNTER — Other Ambulatory Visit: Payer: Self-pay

## 2020-12-07 VITALS — BP 123/48 | HR 100 | Temp 96.9°F | Resp 18 | Wt 224.9 lb

## 2020-12-07 VITALS — BP 111/52 | HR 80 | Temp 97.9°F | Resp 18

## 2020-12-07 DIAGNOSIS — Z85528 Personal history of other malignant neoplasm of kidney: Secondary | ICD-10-CM | POA: Diagnosis not present

## 2020-12-07 DIAGNOSIS — E7800 Pure hypercholesterolemia, unspecified: Secondary | ICD-10-CM | POA: Diagnosis not present

## 2020-12-07 DIAGNOSIS — C9002 Multiple myeloma in relapse: Secondary | ICD-10-CM | POA: Diagnosis not present

## 2020-12-07 DIAGNOSIS — C9000 Multiple myeloma not having achieved remission: Secondary | ICD-10-CM

## 2020-12-07 DIAGNOSIS — D649 Anemia, unspecified: Secondary | ICD-10-CM

## 2020-12-07 DIAGNOSIS — Z5112 Encounter for antineoplastic immunotherapy: Secondary | ICD-10-CM | POA: Diagnosis not present

## 2020-12-07 DIAGNOSIS — Z86718 Personal history of other venous thrombosis and embolism: Secondary | ICD-10-CM | POA: Diagnosis not present

## 2020-12-07 DIAGNOSIS — Z5111 Encounter for antineoplastic chemotherapy: Secondary | ICD-10-CM | POA: Diagnosis not present

## 2020-12-07 LAB — COMPREHENSIVE METABOLIC PANEL
ALT: 19 U/L (ref 0–44)
AST: 14 U/L — ABNORMAL LOW (ref 15–41)
Albumin: 3.4 g/dL — ABNORMAL LOW (ref 3.5–5.0)
Alkaline Phosphatase: 70 U/L (ref 38–126)
Anion gap: 17 — ABNORMAL HIGH (ref 5–15)
BUN: 34 mg/dL — ABNORMAL HIGH (ref 8–23)
CO2: 27 mmol/L (ref 22–32)
Calcium: 9.2 mg/dL (ref 8.9–10.3)
Chloride: 94 mmol/L — ABNORMAL LOW (ref 98–111)
Creatinine, Ser: 6.25 mg/dL — ABNORMAL HIGH (ref 0.61–1.24)
GFR, Estimated: 9 mL/min — ABNORMAL LOW (ref >60)
Glucose, Bld: 60 mg/dL — ABNORMAL LOW (ref 70–99)
Potassium: 3.7 mmol/L (ref 3.5–5.1)
Sodium: 138 mmol/L (ref 135–145)
Total Bilirubin: 0.8 mg/dL (ref 0.3–1.2)
Total Protein: 6.5 g/dL (ref 6.5–8.1)

## 2020-12-07 LAB — CBC WITH DIFFERENTIAL/PLATELET
Abs Immature Granulocytes: 0.03 10*3/uL (ref 0.00–0.07)
Basophils Absolute: 0 10*3/uL (ref 0.0–0.1)
Basophils Relative: 0 %
Eosinophils Absolute: 0 10*3/uL (ref 0.0–0.5)
Eosinophils Relative: 1 %
HCT: 19.1 % — ABNORMAL LOW (ref 39.0–52.0)
Hemoglobin: 6.1 g/dL — CL (ref 13.0–17.0)
Immature Granulocytes: 1 %
Lymphocytes Relative: 36 %
Lymphs Abs: 1.1 10*3/uL (ref 0.7–4.0)
MCH: 32.6 pg (ref 26.0–34.0)
MCHC: 31.9 g/dL (ref 30.0–36.0)
MCV: 102.1 fL — ABNORMAL HIGH (ref 80.0–100.0)
Monocytes Absolute: 0.3 10*3/uL (ref 0.1–1.0)
Monocytes Relative: 10 %
Neutro Abs: 1.5 10*3/uL — ABNORMAL LOW (ref 1.7–7.7)
Neutrophils Relative %: 52 %
Platelets: 28 10*3/uL — CL (ref 150–400)
RBC: 1.87 MIL/uL — ABNORMAL LOW (ref 4.22–5.81)
RDW: 17.6 % — ABNORMAL HIGH (ref 11.5–15.5)
WBC: 3 10*3/uL — ABNORMAL LOW (ref 4.0–10.5)
nRBC: 0 % (ref 0.0–0.2)

## 2020-12-07 LAB — PREPARE RBC (CROSSMATCH)

## 2020-12-07 MED ORDER — SODIUM CHLORIDE 0.9 % IV SOLN: Freq: Once | INTRAVENOUS | Status: AC

## 2020-12-07 MED ORDER — DEXTROSE 5 % IV SOLN
56.0000 mg/m2 | Freq: Once | INTRAVENOUS | Status: AC
  Administered 2020-12-07: 120 mg via INTRAVENOUS
  Filled 2020-12-07: qty 60

## 2020-12-07 MED ORDER — HEPARIN SOD (PORK) LOCK FLUSH 100 UNIT/ML IV SOLN
500.0000 [IU] | Freq: Once | INTRAVENOUS | Status: AC | PRN
  Administered 2020-12-07: 500 [IU]

## 2020-12-07 MED ORDER — SODIUM CHLORIDE 0.9 % IV SOLN
Freq: Once | INTRAVENOUS | Status: AC
  Administered 2020-12-07: 8 mg via INTRAVENOUS
  Filled 2020-12-07: qty 4

## 2020-12-07 MED ORDER — SODIUM CHLORIDE 0.9 % IV SOLN
40.0000 mg | Freq: Once | INTRAVENOUS | Status: AC
  Administered 2020-12-07: 40 mg via INTRAVENOUS
  Filled 2020-12-07: qty 4

## 2020-12-07 MED ORDER — SODIUM CHLORIDE 0.9% FLUSH
10.0000 mL | INTRAVENOUS | Status: DC | PRN
Start: 2020-12-07 — End: 2020-12-07
  Administered 2020-12-07: 10 mL

## 2020-12-07 MED ORDER — SODIUM CHLORIDE 0.9 % IV SOLN
300.0000 mg/m2 | Freq: Once | INTRAVENOUS | Status: AC
  Administered 2020-12-07: 660 mg via INTRAVENOUS
  Filled 2020-12-07: qty 33

## 2020-12-07 NOTE — Patient Instructions (Signed)
[facility] Discharge Instructions for Patients Receiving Chemotherapy  Today you received the following chemotherapy agents   To help prevent nausea and vomiting after your treatment, we encourage you to take your nausea medication   If you develop nausea and vomiting that is not controlled by your nausea medication, call the clinic.   BELOW ARE SYMPTOMS THAT SHOULD BE REPORTED IMMEDIATELY:  *FEVER GREATER THAN 100.5 F  *CHILLS WITH OR WITHOUT FEVER  NAUSEA AND VOMITING THAT IS NOT CONTROLLED WITH YOUR NAUSEA MEDICATION  *UNUSUAL SHORTNESS OF BREATH  *UNUSUAL BRUISING OR BLEEDING  TENDERNESS IN MOUTH AND THROAT WITH OR WITHOUT PRESENCE OF ULCERS  *URINARY PROBLEMS  *BOWEL PROBLEMS  UNUSUAL RASH Items with * indicate a potential emergency and should be followed up as soon as possible.  Feel free to call the clinic should you have any questions or concerns. The clinic phone number is [phone].  Please show the CHEMO ALERT CARD at check-in to the Emergency Department and triage nurse.   

## 2020-12-07 NOTE — Patient Instructions (Addendum)
[place] at [facility] Discharge Instructions  You were seen today by Dr. Delton Coombes. He went over your recent results and scans. You received your treatment today. Continue using a walker to walk at home. Dr. Delton Coombes will see you back in 1 week for labs and follow up.   Thank you for choosing [place] at [facility] to provide your oncology and hematology care.  To afford each patient quality time with our provider, please arrive at least 15 minutes before your scheduled appointment time.   If you have a lab appointment with the [place] please come in thru the Main Entrance and check in at the main information desk  You need to re-schedule your appointment should you arrive 10 or more minutes late.  We strive to give you quality time with our providers, and arriving late affects you and other patients whose appointments are after yours.  Also, if you no show three or more times for appointments you may be dismissed from the clinic at the providers discretion.     Again, thank you for choosing [facility].  Our hope is that these requests will decrease the amount of time that you wait before being seen by our physicians.       _____________________________________________________________  Should you have questions after your visit to [facility], please contact our office at [phone] between the hours of 8:00 a.m. and 4:30 p.m.  Voicemails left after 4:00 p.m. will not be returned until the following business day.  For prescription refill requests, have your pharmacy contact our office and allow 72 hours.    Cancer Center Support Programs:   > Cancer Support Group  2nd Tuesday of the month 1pm-2pm, Journey Room

## 2020-12-07 NOTE — Progress Notes (Unsigned)
Patient was assessed by Dr. Katragadda and labs have been reviewed.  Patient is okay to proceed with treatment today same dose. Primary RN and pharmacy aware.   

## 2020-12-07 NOTE — Progress Notes (Signed)
Tom Marshall, [place] [address]   CLINIC:  Medical Oncology/Hematology  PCP:  Asencion Noble, MD [address] / [place] [address] [phone]   REASON FOR VISIT:  Follow-up for multiple myeloma  PRIOR THERAPY:  1. Revlimid x 4 cycles from 09/05/2017 to 02/18/2018 with Revlimid maintenance. 2. Pomalyst from 06/20/2020 to 09/19/2020. 3. Darzalex x 6 cycles from 04/11/2020 to 09/05/2020. 4. Velcade x 2 cycles from 10/01/2020 to 11/09/2020.  NGS Results: Not done  CURRENT THERAPY: KCyD weekly  BRIEF ONCOLOGIC HISTORY:  Oncology History  Multiple myeloma ([place])  08/29/2017 Initial Diagnosis   Multiple myeloma ([place])   12/10/2017 - 03/11/2018 Chemotherapy   The patient had dexamethasone (DECADRON) 4 MG tablet, 1 of 1 cycle, Start date: --, End date: -- lenalidomide (REVLIMID) 25 MG capsule, 1 of 1 cycle, Start date: --, End date: -- bortezomib SQ (VELCADE) chemo injection 2.75 mg, 1.3 mg/m2 = 2.75 mg, Subcutaneous,  Once, 5 of 5 cycles Administration: 2.75 mg (12/10/2017), 2.75 mg (12/17/2017), 2.75 mg (12/31/2017), 2.75 mg (01/21/2018), 2.75 mg (02/18/2018), 2.75 mg (03/11/2018)  for chemotherapy treatment.    04/11/2020 - 09/05/2020 Chemotherapy   The patient had daratumumab (DARZALEX) 800 mg in sodium chloride 0.9 % 960 mL (0.8 mg/mL) chemo infusion, 8 mg/kg = 800 mg (100 % of original dose 8 mg/kg), Intravenous, Once, 1 of 1 cycle Dose modification: 8 mg/kg (original dose 8 mg/kg, Cycle 1, Reason: Provider Judgment, Comment: giving total dose 64m/kg over 2 days) daratumumab (DARZALEX) 800 mg in sodium chloride 0.9 % 460 mL (1.6 mg/mL) chemo infusion, 8 mg/kg = 800 mg (50 % of original dose 16 mg/kg), Intravenous, Once, 2 of 2 cycles Dose modification: 8 mg/kg (original dose 16 mg/kg, Cycle 1, Reason: Provider Judgment, Comment: giving 16 mg/kg over 2 days. thus 8 mg/kg in 2 divided doses.) Administration: 1,600 mg (04/19/2020), 1,600 mg  (05/03/2020), 1,600 mg (05/09/2020), 800 mg (04/11/2020), 800 mg (04/12/2020) daratumumab (DARZALEX) 1,600 mg in sodium chloride 0.9 % 420 mL chemo infusion, 16 mg/kg = 1,600 mg, Intravenous, Once, 5 of 6 cycles Administration: 1,600 mg (05/16/2020), 1,600 mg (05/23/2020), 1,600 mg (05/30/2020), 1,600 mg (06/06/2020), 1,600 mg (06/13/2020), 1,600 mg (06/27/2020), 1,600 mg (07/11/2020), 1,600 mg (07/25/2020), 1,600 mg (08/08/2020), 1,600 mg (08/22/2020), 1,600 mg (09/05/2020)  for chemotherapy treatment.    09/30/2020 - 11/09/2020 Chemotherapy         11/23/2020 -  Chemotherapy    Patient is on Treatment Plan: MYELOMA RELAPSED/REFRACTORY CARFILZOMIB + DEXAMETHASONE (KD) WEEKLY Q28D        CANCER STAGING: Cancer Staging No matching staging information was found for the patient.  INTERVAL HISTORY:  [name] a 70y.o. male, returns for routine follow-up and consideration for next cycle of chemotherapy. WFarzadwas last seen on 11/30/2020.  Due for day #15 of cycle #1 of KCyD today.   Overall, he tells me he has been feeling okay. He is tolerating dialysis well and denies receiving Epogen or Retacrit. He denies feeling extremely fatigued today, though he is a bit tired. He tolerated the previous treatment well and denies having any cough, SOB or N/V. He continues having worsening back pain radiating down his left hamstring for the past several weeks, which is interfering with his walking now, but not hurting when he sits or lies down. He has to ambulate with a walker.  Overall, he feels ready for next cycle of chemo today.    REVIEW OF SYSTEMS:  Review of Systems  Constitutional: Positive for appetite change (75%) and fatigue (50%).  Respiratory: Negative for cough and shortness of breath.   Musculoskeletal: Positive for back pain (6/10 back & L leg pain) and gait problem (d/t L leg pain).  Neurological: Positive for gait problem (d/t L leg pain).  All other systems reviewed and are  negative.   PAST MEDICAL/SURGICAL HISTORY:  Past Medical History:  Diagnosis Date  . Cancer ([place])    multiple myeloma  . Cancer of right kidney ([place])   . Cervical dystonia   . Diabetes mellitus without complication ([place])   . Gout   . Hypercholesteremia    Past Surgical History:  Procedure Laterality Date  . BONE MARROW BIOPSY    . CHOLECYSTECTOMY  2007  . COLONOSCOPY WITH PROPOFOL N/A 01/16/2018   Procedure: COLONOSCOPY WITH PROPOFOL;  Surgeon: Daneil Dolin, MD;  Location: [facility];  Service: Endoscopy;  Laterality: N/A;  1:45pm  . ESOPHAGOGASTRODUODENOSCOPY (EGD) WITH PROPOFOL N/A 01/16/2018   Procedure: ESOPHAGOGASTRODUODENOSCOPY (EGD) WITH PROPOFOL;  Surgeon: Daneil Dolin, MD;  Location: [facility];  Service: Endoscopy;  Laterality: N/A;  . GIVENS CAPSULE STUDY N/A 05/12/2018   Procedure: GIVENS CAPSULE STUDY;  Surgeon: Daneil Dolin, MD;  Location: [facility];  Service: Endoscopy;  Laterality: N/A;  7:30am  . IR FLUORO GUIDE CV LINE LEFT  09/29/2020  . IR FLUORO GUIDE CV LINE LEFT  10/05/2020  . IR US GUIDE VASC ACCESS LEFT  09/29/2020  . IR US GUIDE VASC ACCESS LEFT  10/05/2020  . NEPHRECTOMY Right 1998   cancer  . PORTACATH PLACEMENT Right 04/07/2020   Procedure: INSERTION PORT-A-CATH (attached catherter in right internal jugular);  Surgeon: Virl Cagey, MD;  Location: [facility];  Service: General;  Laterality: Right;    SOCIAL HISTORY:  Social History   Socioeconomic History  . Marital status: Single    Spouse name: Not on file  . Number of children: Not on file  . Years of education: Not on file  . Highest education level: Not on file  Occupational History  . Occupation: Marine scientist, Insurance underwriter  Tobacco Use  . Smoking status: Never Smoker  . Smokeless tobacco: Former Systems developer    Types: Secondary school teacher  . Vaping Use: Never used  Substance and Sexual Activity  . Alcohol use: No  . Drug use: No  . Sexual activity: Not Currently  Other Topics  Concern  . Not on file  Social History Narrative  . Not on file   Social Determinants of Health   Financial Resource Strain: Low Risk   . Difficulty of Paying Living Expenses: Not hard at all  Food Insecurity: No Food Insecurity  . Worried About Charity fundraiser in the Last Year: Never true  . Ran Out of Food in the Last Year: Never true  Transportation Needs: No Transportation Needs  . Lack of Transportation (Medical): No  . Lack of Transportation (Non-Medical): No  Physical Activity: Inactive  . Days of Exercise per Week: 0 days  . Minutes of Exercise per Session: 0 min  Stress: No Stress Concern Present  . Feeling of Stress : Not at all  Social Connections: Moderately Integrated  . Frequency of Communication with Friends and Family: More than three times a week  . Frequency of Social Gatherings with Friends and Family: More than three times a week  . Attends Religious Services: More than 4 times per year  . Active  Member of Clubs or Organizations: Yes  . Attends Archivist Meetings: More than 4 times per year  . Marital Status: Never married  Intimate Partner Violence: Not At Risk  . Fear of Current or Ex-Partner: No  . Emotionally Abused: No  . Physically Abused: No  . Sexually Abused: No    FAMILY HISTORY:  Family History  Problem Relation Age of Onset  . Heart failure Mother 107  . Dementia Father   . Colon cancer Neg Hx     CURRENT MEDICATIONS:  Current Outpatient Medications  Medication Sig Dispense Refill  . acyclovir (ZOVIRAX) 200 MG capsule Take 1 capsule (200 mg total) by mouth 2 (two) times daily. 60 capsule 3  . alfuzosin (UROXATRAL) 10 MG 24 hr tablet Take 1 tablet (10 mg total) by mouth daily with breakfast. 30 tablet 11  . allopurinol (ZYLOPRIM) 100 MG tablet Take 2 tablets (200 mg total) by mouth every morning. 60 tablet 0  . apixaban (ELIQUIS) 2.5 MG TABS tablet Take 1 tablet (2.5 mg total) by mouth daily. Start taking from tomorrow  10/06/2020    . B-D UF III MINI PEN NEEDLES 31G X 5 MM MISC   4  . cyclobenzaprine (FLEXERIL) 10 MG tablet TAKE 1 TABLET THREE TIMES DAILY AS NEEDED FOR MUSCLE SPASM 30 tablet 0  . dexamethasone (DECADRON) 4 MG tablet Take 5 tablets (20 mg total) by mouth once a week. 20 tablet 3  . dicyclomine (BENTYL) 10 MG capsule Take 10 mg by mouth 2 (two) times daily as needed.     . diphenoxylate-atropine (LOMOTIL) 2.5-0.025 MG tablet Take 2 tablets by mouth 4 (four) times daily as needed.     . ergocalciferol (VITAMIN D2) 1.25 MG (50000 UT) capsule Take by mouth.    . furosemide (LASIX) 40 MG tablet Take 1.5 tablets (60 mg total) by mouth daily. 45 tablet 1  . gabapentin (NEURONTIN) 300 MG capsule Take 1 capsule (300 mg total) by mouth at bedtime.    [name] glipiZIDE (GLUCOTROL XL) 10 MG 24 hr tablet TAKE (1) TABLET BY MOUTH DAILY WITH BREAKFAST. 30 tablet 0  . HYDROcodone-acetaminophen (NORCO) 10-325 MG tablet Take 1 tablet by mouth 2 (two) times daily as needed. 60 tablet 0  . lanthanum (FOSRENOL) 1000 MG chewable tablet     . lisinopril (ZESTRIL) 5 MG tablet Take 1 tablet by mouth daily.    . Multiple Vitamins-Minerals (CENTRUM SILVER 50+MEN) TABS Take 1 tablet by mouth every morning.    [name] NOVOLOG FLEXPEN 100 UNIT/ML FlexPen Inject 8 Units into the skin 3 (three) times daily with meals.     . pomalidomide (POMALYST) 2 MG capsule     . pravastatin (PRAVACHOL) 20 MG tablet Take 20 mg by mouth at bedtime.     . selinexor (XPOVIO) 40 MG TBPK Take 40 mg by mouth once a week. 4 each 0  . sodium bicarbonate 650 MG tablet Take by mouth.    . traZODone (DESYREL) 100 MG tablet TAKE ONE TABLET BY MOUTH AT BEDTIME. 30 tablet 0  . TRESIBA FLEXTOUCH 200 UNIT/ML FlexTouch Pen Inject 16 Units into the skin daily.    . TRUE METRIX BLOOD GLUCOSE TEST test strip     . VELPHORO 500 MG chewable tablet Chew 1,000 mg by mouth 3 (three) times daily.    [name] acetaminophen (TYLENOL) 325 MG tablet Take 650 mg by mouth every 6 (six)  hours as needed. (Patient not taking: Reported on 12/07/2020)  No current facility-administered medications for this visit.   Facility-Administered Medications Ordered in Other Visits  Medication Dose Route Frequency Provider Last Rate Last Admin  . 0.9 %  sodium chloride infusion  250 mL Intravenous Once Twana First, MD      . 0.9 %  sodium chloride infusion   Intravenous Once Derek Jack, MD      . 0.9 %  sodium chloride infusion   Intravenous Once Derek Jack, MD      . carfilzomib (KYPROLIS) 120 mg in dextrose 5 % 100 mL chemo infusion  56 mg/m2 (Capped) Intravenous Once Derek Jack, MD      . cyclophosphamide (CYTOXAN) 660 mg in sodium chloride 0.9 % 250 mL chemo infusion  300 mg/m2 (Treatment Plan Recorded) Intravenous Once Derek Jack, MD      . dexamethasone (DECADRON) 40 mg in sodium chloride 0.9 % 50 mL IVPB  40 mg Intravenous Once Derek Jack, MD      . heparin lock flush 100 unit/mL  500 Units Intracatheter Once PRN Derek Jack, MD      . ondansetron (ZOFRAN) 8 mg in sodium chloride 0.9 % 50 mL IVPB   Intravenous Once Derek Jack, MD      . sodium chloride flush (NS) 0.9 % injection 10 mL  10 mL Intracatheter PRN Derek Jack, MD        ALLERGIES:  No Known Allergies  PHYSICAL EXAM:  Performance status (ECOG): 1 - Symptomatic but completely ambulatory  Vitals:   12/07/20 0846  BP: (!) 123/48  Pulse: 100  Resp: 18  Temp: (!) 96.9 F (36.1 C)  SpO2: 98%   Wt Readings from Last 3 Encounters:  12/07/20 224 lb 13.9 oz (102 kg)  11/30/20 215 lb 9.8 oz (97.8 kg)  11/25/20 224 lb 12.8 oz (102 kg)   Physical Exam Vitals reviewed.  Constitutional:      Appearance: Normal appearance. He is obese.  Cardiovascular:     Rate and Rhythm: Normal rate and regular rhythm.     Pulses: Normal pulses.     Heart sounds: Normal heart sounds.  Pulmonary:     Effort: Pulmonary effort is normal.     Breath  sounds: Normal breath sounds.  Abdominal:     Palpations: Abdomen is soft. There is no mass.     Tenderness: There is no abdominal tenderness.  Musculoskeletal:     Right lower leg: Edema (lymphedema) present.     Left lower leg: Edema (lymphedema) present.  Neurological:     General: No focal deficit present.     Mental Status: He is alert and oriented to person, place, and time.  Psychiatric:        Mood and Affect: Mood normal.        Behavior: Behavior normal.     LABORATORY DATA:  I have reviewed the labs as listed.  CBC Latest Ref Rng & Units 12/07/2020 11/30/2020 11/23/2020  WBC 4.0 - 10.5 K/uL 3.0(L) 3.6(L) 4.0  Hemoglobin 13.0 - 17.0 g/dL 6.1(LL) 7.7(L) 6.2(LL)  Hematocrit 39.0 - 52.0 % 19.1(L) 23.3(L) 18.9(L)  Platelets 150 - 400 K/uL 28(LL) 32(L) 28(LL)   CMP Latest Ref Rng & Units 12/07/2020 11/30/2020 11/23/2020  Glucose 70 - 99 mg/dL 60(L) 133(H) 176(H)  BUN 8 - 23 mg/dL 34(H) 28(H) 31(H)  Creatinine 0.61 - 1.24 mg/dL 6.25(H) 5.51(H) 5.85(H)  Sodium 135 - 145 mmol/L 138 135 138  Potassium 3.5 - 5.1 mmol/L 3.7 3.5 3.4(L)  Chloride 98 -  111 mmol/L 94(L) 94(L) 96(L)  CO2 22 - 32 mmol/L '27 29 30  ' Calcium 8.9 - 10.3 mg/dL 9.2 9.6 9.5  Total Protein 6.5 - 8.1 g/dL 6.5 6.8 6.1(L)  Total Bilirubin 0.3 - 1.2 mg/dL 0.8 1.0 0.5  Alkaline Phos 38 - 126 U/L 70 72 68  AST 15 - 41 U/L 14(L) 14(L) 19  ALT 0 - 44 U/L '19 19 22    ' DIAGNOSTIC IMAGING:  I have independently reviewed the scans and discussed with the patient. MR Lumbar Spine Wo Contrast  Result Date: 11/30/2020 CLINICAL DATA:  Multiple myeloma. EXAM: MRI LUMBAR SPINE WITHOUT CONTRAST TECHNIQUE: Multiplanar, multisequence MR imaging of the lumbar spine was performed. No intravenous contrast was administered. COMPARISON:  None. FINDINGS: Segmentation: The inferior-most fully formed intervertebral disc is labeled L5-S1. Alignment:  Normal Vertebrae: Diffusely abnormal/T1 hypointense marrow signal throughout the visualized  thoracolumbar spine and sacrum sacrum with interspersed patchy preserved fatty marrow. Vertebral body heights are maintained. No focal/linear STIR hyperintensity to suggest acute fracture. Conus medullaris and cauda equina: Conus extends to the superior L2 level. Conus appears normal. Paraspinal and other soft tissues: No acute abnormality. Disc levels: T12-L1: No significant disc protrusion, foraminal stenosis, or canal stenosis. L1-L2: Mild disc bulging without significant canal or foraminal stenosis. L2-L3: Mild right eccentric disc bulging without significant canal or foraminal stenosis. L3-L4: Disc height loss. Broad disc bulge with superimposed right paracentral disc protrusion. Moderate bilateral facet hypertrophy and marked ligamentum flavum thickening. Resulting moderate to severe canal stenosis. Moderate left and mild right foraminal stenosis. L4-L5: Broad-based disc bulge with mild bilateral facet hypertrophy and bilateral facet joint effusions. Ligamentum flavum thickening. Mild canal stenosis. No significant foraminal stenosis. L5-S1: Broad-based disc bulge with moderate bilateral facet hypertrophy and ligamentum flavum thickening. No significant canal or foraminal stenosis. IMPRESSION: 1. Evidence of diffuse multiple myeloma involvement in the visualized thoracolumbar spine and sacrum. No prior lumbar MRI is available for comparison. No evidence of pathologic fracture. No specific evidence of epidural/canal involvement, although postcontrast imaging could provide more sensitive evaluation if clinically indicated. 2. Multilevel degenerative change. At L3-L4 there is moderate to severe canal stenosis, moderate left and mild right foraminal stenosis. Electronically Signed   By: Margaretha Sheffield MD   On: 11/30/2020 15:12   ECHOCARDIOGRAM COMPLETE  Result Date: 11/28/2020    ECHOCARDIOGRAM REPORT   Patient Name:   Tom Marshall Date of Exam: 11/28/2020 Medical Rec #:  [id number]        Height:        68.0 in Accession #:    [id number]       Weight:       224.8 lb Date of Birth:  02-Mar-1951        BSA:          2.148 m Patient Age:    27 years         BP:           141/68 mmHg Patient Gender: M                HR:           107 bpm. Exam Location:  [name] Procedure: 2D Echo, Cardiac Doppler and Color Doppler Indications:    Chemo Z09  History:        Patient has no prior history of Echocardiogram examinations.                 Risk Factors:Diabetes and Dyslipidemia. History of Cancer-  Multiple myeloma not having achieved remission, CKD (chronic                 kidney disease).  Sonographer:    Alvino Chapel RCS Referring Phys: [phone] [place]  1. Left ventricular ejection fraction, by estimation, is 65 to 70%. The left ventricle has normal function. The left ventricle has no regional wall motion abnormalities. Left ventricular diastolic parameters were normal.  2. Right ventricular systolic function is normal. The right ventricular size is normal. Tricuspid regurgitation signal is inadequate for assessing PA pressure.  3. The mitral valve is grossly normal. Trivial mitral valve regurgitation.  4. The aortic valve is tricuspid. Aortic valve regurgitation is not visualized. Mild to moderate aortic valve sclerosis/calcification is present, without any evidence of aortic stenosis. Aortic valve mean gradient measures 11.0 mmHg.  5. The inferior vena cava is normal in size with greater than 50% respiratory variability, suggesting right atrial pressure of 3 mmHg. FINDINGS  Left Ventricle: Left ventricular ejection fraction, by estimation, is 65 to 70%. The left ventricle has normal function. The left ventricle has no regional wall motion abnormalities. The left ventricular internal cavity size was normal in size. There is  borderline left ventricular hypertrophy. Left ventricular diastolic parameters were normal. Right Ventricle: The right ventricular size is normal. No increase  in right ventricular wall thickness. Right ventricular systolic function is normal. Tricuspid regurgitation signal is inadequate for assessing PA pressure. Left Atrium: Left atrial size was normal in size. Right Atrium: Right atrial size was normal in size. Pericardium: There is no evidence of pericardial effusion. Mitral Valve: The mitral valve is grossly normal. Trivial mitral valve regurgitation. Tricuspid Valve: The tricuspid valve is grossly normal. Tricuspid valve regurgitation is trivial. Aortic Valve: The aortic valve is tricuspid. There is mild aortic valve annular calcification. Aortic valve regurgitation is not visualized. Mild to moderate aortic valve sclerosis/calcification is present, without any evidence of aortic stenosis. Aortic  valve mean gradient measures 11.0 mmHg. Aortic valve peak gradient measures 20.4 mmHg. Aortic valve area, by VTI measures 1.94 cm. Pulmonic Valve: The pulmonic valve was grossly normal. Pulmonic valve regurgitation is trivial. Aorta: The aortic root is normal in size and structure. Venous: The inferior vena cava is normal in size with greater than 50% respiratory variability, suggesting right atrial pressure of 3 mmHg. IAS/Shunts: No atrial level shunt detected by color flow Doppler.  LEFT VENTRICLE PLAX 2D LVIDd:         5.60 cm  Diastology LVIDs:         2.70 cm  LV e' lateral:   12.30 cm/s LV PW:         1.00 cm  LV E/e' lateral: 10.3 LV IVS:        1.00 cm LVOT diam:     2.00 cm LV SV:         81 LV SV Index:   38 LVOT Area:     3.14 cm  RIGHT VENTRICLE RV S prime:     14.90 cm/s TAPSE (M-mode): 2.3 cm LEFT ATRIUM           Index       RIGHT ATRIUM           Index LA diam:      3.70 cm 1.72 cm/m  RA Area:     16.70 cm LA Vol (A2C): 41.1 ml 19.14 ml/m RA Volume:   42.20 ml  19.65 ml/m LA Vol (A4C): 66.6 ml 31.01 ml/m  AORTIC VALVE AV Area (Vmax):    1.85 cm AV Area (Vmean):   1.97 cm AV Area (VTI):     1.94 cm AV Vmax:           226.00 cm/s AV Vmean:           154.000 cm/s AV VTI:            0.417 m AV Peak Grad:      20.4 mmHg AV Mean Grad:      11.0 mmHg LVOT Vmax:         133.00 cm/s LVOT Vmean:        96.800 cm/s LVOT VTI:          0.258 m LVOT/AV VTI ratio: 0.62  AORTA Ao Root diam: 3.30 cm MITRAL VALVE MV Area (PHT): 3.76 cm     SHUNTS MV Decel Time: 202 msec     Systemic VTI:  0.26 m MV E velocity: 127.00 cm/s  Systemic Diam: 2.00 cm MV A velocity: 163.00 cm/s MV E/A ratio:  0.78 Rozann Lesches MD Electronically signed by Rozann Lesches MD Signature Date/Time: 11/28/2020/11:43:10 AM    Final      ASSESSMENT:  1. IgG kappa multiple myeloma: -4 cycles of RVD from 08/30/2017 through 02/18/2018. -Declined bone marrow transplant. Received maintenance Revlimid 2.5 mg 3 weeks on 1 week off. -Bone marrow biopsy on 02/23/2020 shows 21% plasma cells. Occasional sections had up to 75% myeloma cells. -Chromosome analysis was normal. FISH panel was positive for gain of 1 q., monosomy 13, del 17 P, t(14;20) -PET scan showed new left frontal destructive lesion. Numerous areas of lytic changes in the spine. Lucent areas in T6 with loss of height at T5. Numerous lytic changes throughout the spine noted along with pedicle and lamina and transverse process of T8 vertebral body. Profound hypermetabolic activity within the ribs bilaterally. -Daratumumab, pomalidomide and dexamethasone from 04/11/2020 through 09/05/2020 with progression. -Velcade, selinexor and dexamethasone from 10/01/2020 through 11/09/2020 with progression. -Bone marrow biopsy on 11/18/2020 at [facility] showed 95% plasma cells. -KCyDstarted on 11/23/2020. -2D echo on 11/28/2020 with EF 65 to 70%.  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 -Completed  XRT on 04/05/2020. -MRI thoracic spine on 07/26/2020 showed significant overall improvement in multiple myeloma with much of the bone marrow has returned to normal fatty marrow. Ventral epidural tumor on the right of T5 near completely resolved. Proximal right T7 rib lesion also resolved.  3. 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 He is on Eliquis.   PLAN:  1. IgG kappa multiple myeloma: -He is tolerating full dose of carfilzomib at 56 mg per metered squared very well. -Reviewed his labs today which showed platelet count 28, white count 3.0 with normal ANC.  LFTs are within normal limits. -Proceed with cycle 1 day 15 treatment today.  Next week will be off week. -Hemoglobin today 6.1.  He will receive 2 units of blood. -He will start cycle 2 on 12/21/2020.  2.ESRD on HD: -Continue HD on Monday, Friday and Saturday.  3. Left leg DVT: -Eliquis on hold due to low platelet count.  4.Diabetes: -Continue Tresiba and NovoLog.  5. Insomnia: -Continue trazodone 100 mg at bedtime.  6.  Low back pain: -Continue hydrocodone 10  mg as needed. -Reviewed MRI of the lumbosacral spine from 11/30/2020.  There is myelomatous involvement of the visualized thoracic, lumbar and sacral bones.  There is no extradural mass.  There is severe spinal canal stenosis at L3-L4.   Orders placed this encounter:  No orders of the defined types were placed in this encounter.    Derek Jack, MD [place] [phone]   I, Milinda Antis, am acting as a scribe for Dr. Sanda Linger.  I, Derek Jack MD, have reviewed the above documentation for accuracy and completeness, and I agree with the above.

## 2020-12-07 NOTE — Progress Notes (Signed)
Patient presents today for Cytoxan/Kyprolis infusions.  Labs pending.  Vital signs within parameters for treatment.  Patients only complaint today is that his left leg is sore and causing a limp when he walks.  Labs reviewed patients Hgb 6.2 and his creatinine is 6.25.  Messages sent to [place].  Message received Chasity Edwards,LPN/Dr.Katragadda patient okay for treatment.  Patient will receive 2UPRBC today after treatment.  Patients blood will not be ready until tomorrow so patient will be scheduled for 2 [facility] tomorrow.  Patient and Dr. Delton Coombes were notified and agreeable.   Treatment given today per MD orders.  Tolerated infusion without adverse affects.  Vital signs stable.  No complaints at this time.  Discharge from clinic ambulatory in stable condition.  Alert and oriented X 3.  Follow up with [facility] as scheduled.

## 2020-12-08 ENCOUNTER — Inpatient Hospital Stay (HOSPITAL_COMMUNITY): Payer: Medicare Other

## 2020-12-08 NOTE — Progress Notes (Signed)
CRITICAL VALUE ALERT  Critical Value:  hgb 6.2  Date & Time Notied:  12/07/2020 at [place]  Provider Notified: Dr. Delton Coombes  Orders Received/Actions taken: transfuse 2 units PRBC

## 2020-12-09 ENCOUNTER — Inpatient Hospital Stay (HOSPITAL_COMMUNITY): Payer: Medicare Other

## 2020-12-09 ENCOUNTER — Other Ambulatory Visit: Payer: Self-pay

## 2020-12-09 DIAGNOSIS — D649 Anemia, unspecified: Secondary | ICD-10-CM

## 2020-12-09 DIAGNOSIS — Z5112 Encounter for antineoplastic immunotherapy: Secondary | ICD-10-CM | POA: Diagnosis not present

## 2020-12-09 DIAGNOSIS — E7800 Pure hypercholesterolemia, unspecified: Secondary | ICD-10-CM | POA: Diagnosis not present

## 2020-12-09 DIAGNOSIS — C9000 Multiple myeloma not having achieved remission: Secondary | ICD-10-CM

## 2020-12-09 DIAGNOSIS — Z86718 Personal history of other venous thrombosis and embolism: Secondary | ICD-10-CM | POA: Diagnosis not present

## 2020-12-09 DIAGNOSIS — C9002 Multiple myeloma in relapse: Secondary | ICD-10-CM | POA: Diagnosis not present

## 2020-12-09 DIAGNOSIS — Z85528 Personal history of other malignant neoplasm of kidney: Secondary | ICD-10-CM | POA: Diagnosis not present

## 2020-12-09 DIAGNOSIS — Z5111 Encounter for antineoplastic chemotherapy: Secondary | ICD-10-CM | POA: Diagnosis not present

## 2020-12-09 MED ORDER — HEPARIN SOD (PORK) LOCK FLUSH 100 UNIT/ML IV SOLN
500.0000 [IU] | Freq: Every day | INTRAVENOUS | Status: AC | PRN
  Administered 2020-12-09: 500 [IU]

## 2020-12-09 MED ORDER — DIPHENHYDRAMINE HCL 25 MG PO CAPS
25.0000 mg | ORAL_CAPSULE | Freq: Once | ORAL | Status: AC
  Administered 2020-12-09: 25 mg via ORAL

## 2020-12-09 MED ORDER — SODIUM CHLORIDE 0.9% IV SOLUTION
250.0000 mL | Freq: Once | INTRAVENOUS | Status: AC
  Administered 2020-12-09: 250 mL via INTRAVENOUS

## 2020-12-09 MED ORDER — ACETAMINOPHEN 325 MG PO TABS
650.0000 mg | ORAL_TABLET | Freq: Once | ORAL | Status: AC
  Administered 2020-12-09: 650 mg via ORAL

## 2020-12-09 MED ORDER — SODIUM CHLORIDE 0.9% FLUSH
10.0000 mL | INTRAVENOUS | Status: AC | PRN
  Administered 2020-12-09: 10 mL

## 2020-12-09 NOTE — Progress Notes (Addendum)
Patient presents today for [facility].  Vital signs WNL.  Patient complains of fatigue and dyspnea.    2UPRBC given today per MD orders.  Tolerated infusion without adverse affects.  Vital signs stable.  No complaints at this time.  Discharge from clinic via wheelchair in stable condition.  Alert and oriented X 3.  Follow up with [facility] as scheduled.

## 2020-12-09 NOTE — Progress Notes (Addendum)
UNMATCHED BLOOD PRODUCT NOTE  Compare the patient ID on the blood tag to the patient ID on the hospital armband and Blood Bank armband. Then confirm the unit number on the blood tag matches the unit number on the blood product.  If a discrepancy is discovered return the product to blood bank immediately.  Blood verified by two nurses:  Laurann Montana and Holley Raring  Blood Product Type: Packed Red Blood Cells  Unit #: (Found on blood product bag, begins with W) [id number]  Product Code #: (Found on blood product bag, begins with E) [id number]   Start Time: 1300  Starting Rate: 120 ml/hr  Rate increase/decreased  (if applicable):  301 ml/hr  Rate changed time (if applicable): 0404  Rate increase/decrease:  350 ml/hr  Rate changed time:  1330  Stop Time: 1405  Amount transfused:  315  All Other Documentation should be documented within the Blood Admin Flowsheet per policy.

## 2020-12-09 NOTE — Patient Instructions (Signed)
[facility] Discharge Instructions for Patients Receiving Chemotherapy  Today you received the following chemotherapy agents   To help prevent nausea and vomiting after your treatment, we encourage you to take your nausea medication   If you develop nausea and vomiting that is not controlled by your nausea medication, call the clinic.   BELOW ARE SYMPTOMS THAT SHOULD BE REPORTED IMMEDIATELY:  *FEVER GREATER THAN 100.5 F  *CHILLS WITH OR WITHOUT FEVER  NAUSEA AND VOMITING THAT IS NOT CONTROLLED WITH YOUR NAUSEA MEDICATION  *UNUSUAL SHORTNESS OF BREATH  *UNUSUAL BRUISING OR BLEEDING  TENDERNESS IN MOUTH AND THROAT WITH OR WITHOUT PRESENCE OF ULCERS  *URINARY PROBLEMS  *BOWEL PROBLEMS  UNUSUAL RASH Items with * indicate a potential emergency and should be followed up as soon as possible.  Feel free to call the clinic should you have any questions or concerns. The clinic phone number is [phone].  Please show the CHEMO ALERT CARD at check-in to the Emergency Department and triage nurse.   

## 2020-12-10 DIAGNOSIS — Z23 Encounter for immunization: Secondary | ICD-10-CM | POA: Diagnosis not present

## 2020-12-10 DIAGNOSIS — D649 Anemia, unspecified: Secondary | ICD-10-CM | POA: Diagnosis not present

## 2020-12-10 DIAGNOSIS — N2581 Secondary hyperparathyroidism of renal origin: Secondary | ICD-10-CM | POA: Diagnosis not present

## 2020-12-10 DIAGNOSIS — N178 Other acute kidney failure: Secondary | ICD-10-CM | POA: Diagnosis not present

## 2020-12-10 DIAGNOSIS — Z992 Dependence on renal dialysis: Secondary | ICD-10-CM | POA: Diagnosis not present

## 2020-12-10 LAB — TYPE AND SCREEN
ABO/RH(D): A POS
Antibody Screen: POSITIVE
Unit division: 0
Unit division: 0

## 2020-12-10 LAB — BPAM RBC
Blood Product Expiration Date: 202202282359
Blood Product Expiration Date: 202203182359
ISSUE DATE / TIME: 202202111249
ISSUE DATE / TIME: 202202111805
Unit Type and Rh: 5100
Unit Type and Rh: 6200

## 2020-12-13 DIAGNOSIS — D649 Anemia, unspecified: Secondary | ICD-10-CM | POA: Diagnosis not present

## 2020-12-13 DIAGNOSIS — Z23 Encounter for immunization: Secondary | ICD-10-CM | POA: Diagnosis not present

## 2020-12-13 DIAGNOSIS — N2581 Secondary hyperparathyroidism of renal origin: Secondary | ICD-10-CM | POA: Diagnosis not present

## 2020-12-13 DIAGNOSIS — Z992 Dependence on renal dialysis: Secondary | ICD-10-CM | POA: Diagnosis not present

## 2020-12-13 DIAGNOSIS — N179 Acute kidney failure, unspecified: Secondary | ICD-10-CM | POA: Diagnosis not present

## 2020-12-13 DIAGNOSIS — N178 Other acute kidney failure: Secondary | ICD-10-CM | POA: Diagnosis not present

## 2020-12-14 ENCOUNTER — Encounter (HOSPITAL_COMMUNITY): Payer: Self-pay

## 2020-12-14 ENCOUNTER — Other Ambulatory Visit: Payer: Self-pay

## 2020-12-14 ENCOUNTER — Inpatient Hospital Stay (HOSPITAL_COMMUNITY): Payer: Medicare Other

## 2020-12-14 ENCOUNTER — Ambulatory Visit (HOSPITAL_COMMUNITY): Payer: Medicare Other

## 2020-12-14 VITALS — BP 115/46 | HR 96 | Temp 97.0°F | Resp 20

## 2020-12-14 DIAGNOSIS — Z5111 Encounter for antineoplastic chemotherapy: Secondary | ICD-10-CM | POA: Diagnosis not present

## 2020-12-14 DIAGNOSIS — Z86718 Personal history of other venous thrombosis and embolism: Secondary | ICD-10-CM | POA: Diagnosis not present

## 2020-12-14 DIAGNOSIS — D649 Anemia, unspecified: Secondary | ICD-10-CM

## 2020-12-14 DIAGNOSIS — E7800 Pure hypercholesterolemia, unspecified: Secondary | ICD-10-CM | POA: Diagnosis not present

## 2020-12-14 DIAGNOSIS — C9002 Multiple myeloma in relapse: Secondary | ICD-10-CM | POA: Diagnosis not present

## 2020-12-14 DIAGNOSIS — Z5112 Encounter for antineoplastic immunotherapy: Secondary | ICD-10-CM | POA: Diagnosis not present

## 2020-12-14 DIAGNOSIS — Z85528 Personal history of other malignant neoplasm of kidney: Secondary | ICD-10-CM | POA: Diagnosis not present

## 2020-12-14 DIAGNOSIS — C9000 Multiple myeloma not having achieved remission: Secondary | ICD-10-CM

## 2020-12-14 LAB — CBC WITH DIFFERENTIAL/PLATELET
Abs Immature Granulocytes: 0.05 10*3/uL (ref 0.00–0.07)
Basophils Absolute: 0 10*3/uL (ref 0.0–0.1)
Basophils Relative: 0 %
Eosinophils Absolute: 0 10*3/uL (ref 0.0–0.5)
Eosinophils Relative: 1 %
HCT: 19.6 % — ABNORMAL LOW (ref 39.0–52.0)
Hemoglobin: 6.3 g/dL — CL (ref 13.0–17.0)
Immature Granulocytes: 2 %
Lymphocytes Relative: 23 %
Lymphs Abs: 0.7 10*3/uL (ref 0.7–4.0)
MCH: 31.8 pg (ref 26.0–34.0)
MCHC: 32.1 g/dL (ref 30.0–36.0)
MCV: 99 fL (ref 80.0–100.0)
Monocytes Absolute: 0.4 10*3/uL (ref 0.1–1.0)
Monocytes Relative: 11 %
Neutro Abs: 1.9 10*3/uL (ref 1.7–7.7)
Neutrophils Relative %: 63 %
Platelets: 42 10*3/uL — ABNORMAL LOW (ref 150–400)
RBC: 1.98 MIL/uL — ABNORMAL LOW (ref 4.22–5.81)
RDW: 17.9 % — ABNORMAL HIGH (ref 11.5–15.5)
WBC: 3.1 10*3/uL — ABNORMAL LOW (ref 4.0–10.5)
nRBC: 0 % (ref 0.0–0.2)

## 2020-12-14 LAB — SAMPLE TO BLOOD BANK

## 2020-12-14 LAB — PREPARE RBC (CROSSMATCH)

## 2020-12-14 MED ORDER — HEPARIN SOD (PORK) LOCK FLUSH 100 UNIT/ML IV SOLN
500.0000 [IU] | Freq: Once | INTRAVENOUS | Status: AC
  Administered 2020-12-14: 500 [IU] via INTRAVENOUS

## 2020-12-14 MED ORDER — SODIUM CHLORIDE 0.9% FLUSH
10.0000 mL | Freq: Once | INTRAVENOUS | Status: AC
  Administered 2020-12-14: 10 mL via INTRAVENOUS

## 2020-12-14 NOTE — Addendum Note (Signed)
Addended by: Joie Bimler on: 12/14/2020 09:55 AM   Modules accepted: Orders, SmartSet

## 2020-12-14 NOTE — Progress Notes (Signed)
Patients port flushed without difficulty.  Good blood return noted with no bruising or swelling noted at site.  Band aid applied.  VSS with discharge and left in satisfactory condition with no s/s of distress noted.   

## 2020-12-15 ENCOUNTER — Encounter (HOSPITAL_COMMUNITY): Payer: Self-pay

## 2020-12-15 ENCOUNTER — Other Ambulatory Visit: Payer: Self-pay

## 2020-12-15 ENCOUNTER — Inpatient Hospital Stay (HOSPITAL_COMMUNITY): Payer: Medicare Other

## 2020-12-15 DIAGNOSIS — Z85528 Personal history of other malignant neoplasm of kidney: Secondary | ICD-10-CM | POA: Diagnosis not present

## 2020-12-15 DIAGNOSIS — Z86718 Personal history of other venous thrombosis and embolism: Secondary | ICD-10-CM | POA: Diagnosis not present

## 2020-12-15 DIAGNOSIS — C9002 Multiple myeloma in relapse: Secondary | ICD-10-CM | POA: Diagnosis not present

## 2020-12-15 DIAGNOSIS — Z5111 Encounter for antineoplastic chemotherapy: Secondary | ICD-10-CM | POA: Diagnosis not present

## 2020-12-15 DIAGNOSIS — C9000 Multiple myeloma not having achieved remission: Secondary | ICD-10-CM

## 2020-12-15 DIAGNOSIS — Z5112 Encounter for antineoplastic immunotherapy: Secondary | ICD-10-CM | POA: Diagnosis not present

## 2020-12-15 DIAGNOSIS — D649 Anemia, unspecified: Secondary | ICD-10-CM

## 2020-12-15 DIAGNOSIS — E7800 Pure hypercholesterolemia, unspecified: Secondary | ICD-10-CM | POA: Diagnosis not present

## 2020-12-15 MED ORDER — HEPARIN SOD (PORK) LOCK FLUSH 100 UNIT/ML IV SOLN
500.0000 [IU] | Freq: Every day | INTRAVENOUS | Status: AC | PRN
  Administered 2020-12-15: 500 [IU]

## 2020-12-15 MED ORDER — SODIUM CHLORIDE 0.9% IV SOLUTION
250.0000 mL | Freq: Once | INTRAVENOUS | Status: AC
  Administered 2020-12-15: 250 mL via INTRAVENOUS

## 2020-12-15 MED ORDER — SODIUM CHLORIDE 0.9% FLUSH
10.0000 mL | INTRAVENOUS | Status: AC | PRN
  Administered 2020-12-15: 10 mL

## 2020-12-15 MED ORDER — DIPHENHYDRAMINE HCL 25 MG PO CAPS
25.0000 mg | ORAL_CAPSULE | Freq: Once | ORAL | Status: AC
  Administered 2020-12-15: 25 mg via ORAL
  Filled 2020-12-15: qty 1

## 2020-12-15 MED ORDER — ACETAMINOPHEN 325 MG PO TABS
650.0000 mg | ORAL_TABLET | Freq: Once | ORAL | Status: AC
  Administered 2020-12-15: 650 mg via ORAL
  Filled 2020-12-15: qty 2

## 2020-12-15 NOTE — Progress Notes (Addendum)
UNMATCHED BLOOD PRODUCT NOTE  Compare the patient ID on the blood tag to the patient ID on the hospital armband and Blood Bank armband. Then confirm the unit number on the blood tag matches the unit number on the blood product.  If a discrepancy is discovered return the product to blood bank immediately.   Blood checked by 2 nurses Ronne Binning RN, Brandy Presnell RN  Blood Product Type: Packed Red Blood Cells  Unit #:  [id number]  Product Code #:  [id number]   Start Time: 1205  Starting Rate: 120 ml/hr  Rate increase/decreased  (if applicable): 292 ml/hr 9090  Rate changed time (if applicable): 301 ml/hr 4996   Stop Time:  9249    All Other Documentation should be documented within the Blood Admin Flowsheet per policy.

## 2020-12-15 NOTE — Patient Instructions (Signed)
[name] Discharge Instructions for Patients   Today you received the following chemotherapy agents   To help prevent nausea and vomiting after your treatment, we encourage you to take your nausea medication    If you develop nausea and vomiting that is not controlled by your nausea medication, call the clinic.   BELOW ARE SYMPTOMS THAT SHOULD BE REPORTED IMMEDIATELY:  *FEVER GREATER THAN 100.5 F  *CHILLS WITH OR WITHOUT FEVER  NAUSEA AND VOMITING THAT IS NOT CONTROLLED WITH YOUR NAUSEA MEDICATION  *UNUSUAL SHORTNESS OF BREATH  *UNUSUAL BRUISING OR BLEEDING  TENDERNESS IN MOUTH AND THROAT WITH OR WITHOUT PRESENCE OF ULCERS  *URINARY PROBLEMS  *BOWEL PROBLEMS  UNUSUAL RASH Items with * indicate a potential emergency and should be followed up as soon as possible.  Feel free to call the clinic should you have any questions or concerns. The clinic phone number is [phone].  Please show the CHEMO ALERT CARD at check-in to the Emergency Department and triage nurse.   

## 2020-12-15 NOTE — Progress Notes (Signed)
Patient presents today for [facility].  Vital signs WNL.  Patient only complaint is fatigue not relieved by rest.    2UPRBC given today per MD orders.  Tolerated infusion without adverse affects.  Vital signs stable.  No complaints at this time.  Discharge from clinic via wheelchair in stable condition.  Alert and oriented X 3.  Follow up with [facility] as scheduled.

## 2020-12-16 DIAGNOSIS — N178 Other acute kidney failure: Secondary | ICD-10-CM | POA: Diagnosis not present

## 2020-12-16 DIAGNOSIS — Z23 Encounter for immunization: Secondary | ICD-10-CM | POA: Diagnosis not present

## 2020-12-16 DIAGNOSIS — D649 Anemia, unspecified: Secondary | ICD-10-CM | POA: Diagnosis not present

## 2020-12-16 DIAGNOSIS — N2581 Secondary hyperparathyroidism of renal origin: Secondary | ICD-10-CM | POA: Diagnosis not present

## 2020-12-16 DIAGNOSIS — Z992 Dependence on renal dialysis: Secondary | ICD-10-CM | POA: Diagnosis not present

## 2020-12-16 LAB — TYPE AND SCREEN
ABO/RH(D): A POS
Antibody Screen: POSITIVE
Unit division: 0
Unit division: 0

## 2020-12-16 LAB — BPAM RBC
Blood Product Expiration Date: 202203182359
Blood Product Expiration Date: 202203182359
ISSUE DATE / TIME: 202202171407
ISSUE DATE / TIME: 202202171407
Unit Type and Rh: 6200
Unit Type and Rh: 6200

## 2020-12-16 LAB — KAPPA/LAMBDA LIGHT CHAINS
Kappa, lambda light chain ratio: 1962.02 — ABNORMAL HIGH (ref 0.26–1.65)
Lambda free light chains: 11.3 mg/L (ref 5.7–26.3)

## 2020-12-17 DIAGNOSIS — Z23 Encounter for immunization: Secondary | ICD-10-CM | POA: Diagnosis not present

## 2020-12-17 DIAGNOSIS — Z992 Dependence on renal dialysis: Secondary | ICD-10-CM | POA: Diagnosis not present

## 2020-12-17 DIAGNOSIS — D649 Anemia, unspecified: Secondary | ICD-10-CM | POA: Diagnosis not present

## 2020-12-17 DIAGNOSIS — N2581 Secondary hyperparathyroidism of renal origin: Secondary | ICD-10-CM | POA: Diagnosis not present

## 2020-12-17 DIAGNOSIS — N178 Other acute kidney failure: Secondary | ICD-10-CM | POA: Diagnosis not present

## 2020-12-20 DIAGNOSIS — Z992 Dependence on renal dialysis: Secondary | ICD-10-CM | POA: Diagnosis not present

## 2020-12-20 DIAGNOSIS — D649 Anemia, unspecified: Secondary | ICD-10-CM | POA: Diagnosis not present

## 2020-12-20 DIAGNOSIS — N2581 Secondary hyperparathyroidism of renal origin: Secondary | ICD-10-CM | POA: Diagnosis not present

## 2020-12-20 DIAGNOSIS — Z23 Encounter for immunization: Secondary | ICD-10-CM | POA: Diagnosis not present

## 2020-12-20 DIAGNOSIS — N178 Other acute kidney failure: Secondary | ICD-10-CM | POA: Diagnosis not present

## 2020-12-20 LAB — PROTEIN ELECTROPHORESIS, SERUM
A/G Ratio: 1.6 (ref 0.7–1.7)
Albumin ELP: 3.2 g/dL (ref 2.9–4.4)
Alpha-1-Globulin: 0.2 g/dL (ref 0.0–0.4)
Alpha-2-Globulin: 0.8 g/dL (ref 0.4–1.0)
Beta Globulin: 0.7 g/dL (ref 0.7–1.3)
Gamma Globulin: 0.3 g/dL — ABNORMAL LOW (ref 0.4–1.8)
Globulin, Total: 2 g/dL — ABNORMAL LOW (ref 2.2–3.9)
M-Spike, %: 0.2 g/dL — ABNORMAL HIGH
Total Protein ELP: 5.2 g/dL — ABNORMAL LOW (ref 6.0–8.5)

## 2020-12-21 ENCOUNTER — Other Ambulatory Visit: Payer: Self-pay

## 2020-12-21 ENCOUNTER — Inpatient Hospital Stay (HOSPITAL_COMMUNITY): Payer: Medicare Other

## 2020-12-21 ENCOUNTER — Inpatient Hospital Stay (HOSPITAL_BASED_OUTPATIENT_CLINIC_OR_DEPARTMENT_OTHER): Payer: Medicare Other | Admitting: Hematology

## 2020-12-21 VITALS — BP 117/68 | HR 92 | Temp 97.0°F | Resp 20

## 2020-12-21 VITALS — BP 122/57 | HR 103 | Temp 97.8°F | Resp 20 | Wt 223.1 lb

## 2020-12-21 DIAGNOSIS — C9000 Multiple myeloma not having achieved remission: Secondary | ICD-10-CM | POA: Diagnosis not present

## 2020-12-21 DIAGNOSIS — Z85528 Personal history of other malignant neoplasm of kidney: Secondary | ICD-10-CM | POA: Diagnosis not present

## 2020-12-21 DIAGNOSIS — R4182 Altered mental status, unspecified: Secondary | ICD-10-CM

## 2020-12-21 DIAGNOSIS — Z86718 Personal history of other venous thrombosis and embolism: Secondary | ICD-10-CM | POA: Diagnosis not present

## 2020-12-21 DIAGNOSIS — E7800 Pure hypercholesterolemia, unspecified: Secondary | ICD-10-CM | POA: Diagnosis not present

## 2020-12-21 DIAGNOSIS — Z5112 Encounter for antineoplastic immunotherapy: Secondary | ICD-10-CM | POA: Diagnosis not present

## 2020-12-21 DIAGNOSIS — C9002 Multiple myeloma in relapse: Secondary | ICD-10-CM | POA: Diagnosis not present

## 2020-12-21 DIAGNOSIS — Z5111 Encounter for antineoplastic chemotherapy: Secondary | ICD-10-CM | POA: Diagnosis not present

## 2020-12-21 LAB — COMPREHENSIVE METABOLIC PANEL
ALT: 18 U/L (ref 0–44)
AST: 14 U/L — ABNORMAL LOW (ref 15–41)
Albumin: 3.4 g/dL — ABNORMAL LOW (ref 3.5–5.0)
Alkaline Phosphatase: 97 U/L (ref 38–126)
Anion gap: 14 (ref 5–15)
BUN: 24 mg/dL — ABNORMAL HIGH (ref 8–23)
CO2: 30 mmol/L (ref 22–32)
Calcium: 9.5 mg/dL (ref 8.9–10.3)
Chloride: 97 mmol/L — ABNORMAL LOW (ref 98–111)
Creatinine, Ser: 5.68 mg/dL — ABNORMAL HIGH (ref 0.61–1.24)
GFR, Estimated: 10 mL/min — ABNORMAL LOW (ref >60)
Glucose, Bld: 145 mg/dL — ABNORMAL HIGH (ref 70–99)
Potassium: 3.4 mmol/L — ABNORMAL LOW (ref 3.5–5.1)
Sodium: 141 mmol/L (ref 135–145)
Total Bilirubin: 0.8 mg/dL (ref 0.3–1.2)
Total Protein: 6.4 g/dL — ABNORMAL LOW (ref 6.5–8.1)

## 2020-12-21 LAB — CBC WITH DIFFERENTIAL/PLATELET
Abs Immature Granulocytes: 0.04 10*3/uL (ref 0.00–0.07)
Basophils Absolute: 0 10*3/uL (ref 0.0–0.1)
Basophils Relative: 0 %
Eosinophils Absolute: 0 10*3/uL (ref 0.0–0.5)
Eosinophils Relative: 1 %
HCT: 24.1 % — ABNORMAL LOW (ref 39.0–52.0)
Hemoglobin: 7.7 g/dL — ABNORMAL LOW (ref 13.0–17.0)
Immature Granulocytes: 1 %
Lymphocytes Relative: 25 %
Lymphs Abs: 0.7 10*3/uL (ref 0.7–4.0)
MCH: 31.3 pg (ref 26.0–34.0)
MCHC: 32 g/dL (ref 30.0–36.0)
MCV: 98 fL (ref 80.0–100.0)
Monocytes Absolute: 0.4 10*3/uL (ref 0.1–1.0)
Monocytes Relative: 12 %
Neutro Abs: 1.8 10*3/uL (ref 1.7–7.7)
Neutrophils Relative %: 61 %
Platelets: 65 10*3/uL — ABNORMAL LOW (ref 150–400)
RBC: 2.46 MIL/uL — ABNORMAL LOW (ref 4.22–5.81)
RDW: 17.6 % — ABNORMAL HIGH (ref 11.5–15.5)
WBC: 3 10*3/uL — ABNORMAL LOW (ref 4.0–10.5)
nRBC: 0.7 % — ABNORMAL HIGH (ref 0.0–0.2)

## 2020-12-21 LAB — SAMPLE TO BLOOD BANK

## 2020-12-21 LAB — GLUCOSE, RANDOM: Glucose, Bld: 162 mg/dL — ABNORMAL HIGH (ref 70–99)

## 2020-12-21 MED ORDER — SODIUM CHLORIDE 0.9% FLUSH
10.0000 mL | INTRAVENOUS | Status: DC | PRN
Start: 2020-12-21 — End: 2020-12-21
  Administered 2020-12-21: 10 mL

## 2020-12-21 MED ORDER — DEXTROSE 5 % IV SOLN
56.0000 mg/m2 | Freq: Once | INTRAVENOUS | Status: AC
  Administered 2020-12-21: 120 mg via INTRAVENOUS
  Filled 2020-12-21: qty 60

## 2020-12-21 MED ORDER — SODIUM CHLORIDE 0.9 % IV SOLN: Freq: Once | INTRAVENOUS | Status: AC

## 2020-12-21 MED ORDER — SODIUM CHLORIDE 0.9 % IV SOLN
Freq: Once | INTRAVENOUS | Status: AC
  Administered 2020-12-21: 8 mg via INTRAVENOUS
  Filled 2020-12-21: qty 4

## 2020-12-21 MED ORDER — SODIUM CHLORIDE 0.9 % IV SOLN
Freq: Once | INTRAVENOUS | Status: AC
Start: 2020-12-21 — End: 2020-12-21

## 2020-12-21 MED ORDER — SODIUM CHLORIDE 0.9 % IV SOLN
300.0000 mg/m2 | Freq: Once | INTRAVENOUS | Status: AC
  Administered 2020-12-21: 660 mg via INTRAVENOUS
  Filled 2020-12-21: qty 33

## 2020-12-21 MED ORDER — SODIUM CHLORIDE 0.9 % IV SOLN
40.0000 mg | Freq: Once | INTRAVENOUS | Status: AC
  Administered 2020-12-21: 40 mg via INTRAVENOUS
  Filled 2020-12-21: qty 4

## 2020-12-21 MED ORDER — HEPARIN SOD (PORK) LOCK FLUSH 100 UNIT/ML IV SOLN
500.0000 [IU] | Freq: Once | INTRAVENOUS | Status: AC | PRN
  Administered 2020-12-21: 500 [IU]

## 2020-12-21 MED ORDER — DEXTROSE 50 % IV SOLN
1.0000 | Freq: Once | INTRAVENOUS | Status: AC
  Administered 2020-12-21: 50 mL via INTRAVENOUS

## 2020-12-21 NOTE — Progress Notes (Signed)
Tom Marshall, Tom Marshall [id number]   CLINIC:  Medical Oncology/Hematology  PCP:  Asencion Noble, MD [address] / [place] [address] [phone]   REASON FOR VISIT:  Follow-up for multiple myeloma  PRIOR THERAPY:  1. Revlimid x 4 cycles from 09/05/2017 to 02/18/2018 with Revlimid maintenance. 2. Pomalyst from 06/20/2020 to 09/19/2020. 3. Darzalex x 6 cycles from 04/11/2020 to 09/05/2020. 4. Velcade x 2 cycles from 10/01/2020 to 11/09/2020.  NGS Results: Not done  CURRENT THERAPY: KCyD weekly  BRIEF ONCOLOGIC HISTORY:  Oncology History  Multiple myeloma ([place])  08/29/2017 Initial Diagnosis   Multiple myeloma ([place])   12/10/2017 - 03/11/2018 Chemotherapy   The patient had dexamethasone (DECADRON) 4 MG tablet, 1 of 1 cycle, Start date: --, End date: -- lenalidomide (REVLIMID) 25 MG capsule, 1 of 1 cycle, Start date: --, End date: -- bortezomib SQ (VELCADE) chemo injection 2.75 mg, 1.3 mg/m2 = 2.75 mg, Subcutaneous,  Once, 5 of 5 cycles Administration: 2.75 mg (12/10/2017), 2.75 mg (12/17/2017), 2.75 mg (12/31/2017), 2.75 mg (01/21/2018), 2.75 mg (02/18/2018), 2.75 mg (03/11/2018)  for chemotherapy treatment.    04/11/2020 - 09/05/2020 Chemotherapy   The patient had daratumumab (DARZALEX) 800 mg in sodium chloride 0.9 % 960 mL (0.8 mg/mL) chemo infusion, 8 mg/kg = 800 mg (100 % of original dose 8 mg/kg), Intravenous, Once, 1 of 1 cycle Dose modification: 8 mg/kg (original dose 8 mg/kg, Cycle 1, Reason: Provider Judgment, Comment: giving total dose 62m/kg over 2 days) daratumumab (DARZALEX) 800 mg in sodium chloride 0.9 % 460 mL (1.6 mg/mL) chemo infusion, 8 mg/kg = 800 mg (50 % of original dose 16 mg/kg), Intravenous, Once, 2 of 2 cycles Dose modification: 8 mg/kg (original dose 16 mg/kg, Cycle 1, Reason: Provider Judgment, Comment: giving 16 mg/kg over 2 days. thus 8 mg/kg in 2 divided doses.) Administration: 1,600 mg (04/19/2020), 1,600 mg  (05/03/2020), 1,600 mg (05/09/2020), 800 mg (04/11/2020), 800 mg (04/12/2020) daratumumab (DARZALEX) 1,600 mg in sodium chloride 0.9 % 420 mL chemo infusion, 16 mg/kg = 1,600 mg, Intravenous, Once, 5 of 6 cycles Administration: 1,600 mg (05/16/2020), 1,600 mg (05/23/2020), 1,600 mg (05/30/2020), 1,600 mg (06/06/2020), 1,600 mg (06/13/2020), 1,600 mg (06/27/2020), 1,600 mg (07/11/2020), 1,600 mg (07/25/2020), 1,600 mg (08/08/2020), 1,600 mg (08/22/2020), 1,600 mg (09/05/2020)  for chemotherapy treatment.    09/30/2020 - 11/09/2020 Chemotherapy         11/23/2020 -  Chemotherapy    Patient is on Treatment Plan: MYELOMA RELAPSED/REFRACTORY CARFILZOMIB + DEXAMETHASONE (KD) WEEKLY Q28D        CANCER STAGING: Cancer Staging No matching staging information was found for the patient.  INTERVAL HISTORY:  Mr. WVIRGINIO Marshall a 70y.o. male, returns for routine follow-up and consideration for next cycle of chemotherapy. WDewiewas last seen on 12/07/2020.  Due for cycle #2 of KCyD today.   Overall, he tells me he has been feeling well. His back and left leg pain has improved and he is tolerating walking; the pain is worse when he gets up and walks. He notes that the leg swelling has worsened which was noticed during dialysis. He tolerated the previous treatment well and notes having diarrhea, usually once to twice per week and has to take Imodium which helps stop. The numbness in his hands is stable. He was able to urinate a little volume yesterday. His appetite is excellent.  He is going to HD on T/F/Sat.  Overall, he feels ready for next cycle  of chemo today.    REVIEW OF SYSTEMS:  Review of Systems  Constitutional: Positive for fatigue (50%). Negative for appetite change.  Cardiovascular: Positive for leg swelling.  Gastrointestinal: Positive for diarrhea (x1-2 episodes per week).  Genitourinary: Positive for difficulty urinating (urinating more).   Musculoskeletal: Positive for arthralgias (L leg),  back pain (lower back improving) and gait problem (d/t back & leg pain).  Neurological: Positive for gait problem (d/t back & leg pain) and numbness (hands; stable).  All other systems reviewed and are negative.   PAST MEDICAL/SURGICAL HISTORY:  Past Medical History:  Diagnosis Date  . Cancer ([place])    multiple myeloma  . Cancer of right kidney ([place])   . Cervical dystonia   . Diabetes mellitus without complication ([place])   . Gout   . Hypercholesteremia    Past Surgical History:  Procedure Laterality Date  . BONE MARROW BIOPSY    . CHOLECYSTECTOMY  2007  . COLONOSCOPY WITH PROPOFOL N/A 01/16/2018   Procedure: COLONOSCOPY WITH PROPOFOL;  Surgeon: Daneil Dolin, MD;  Location: [facility];  Service: Endoscopy;  Laterality: N/A;  1:45pm  . ESOPHAGOGASTRODUODENOSCOPY (EGD) WITH PROPOFOL N/A 01/16/2018   Procedure: ESOPHAGOGASTRODUODENOSCOPY (EGD) WITH PROPOFOL;  Surgeon: Daneil Dolin, MD;  Location: [facility];  Service: Endoscopy;  Laterality: N/A;  . GIVENS CAPSULE STUDY N/A 05/12/2018   Procedure: GIVENS CAPSULE STUDY;  Surgeon: Daneil Dolin, MD;  Location: [facility];  Service: Endoscopy;  Laterality: N/A;  7:30am  . IR FLUORO GUIDE CV LINE LEFT  09/29/2020  . IR FLUORO GUIDE CV LINE LEFT  10/05/2020  . IR US GUIDE VASC ACCESS LEFT  09/29/2020  . IR US GUIDE VASC ACCESS LEFT  10/05/2020  . NEPHRECTOMY Right 1998   cancer  . PORTACATH PLACEMENT Right 04/07/2020   Procedure: INSERTION PORT-A-CATH (attached catherter in right internal jugular);  Surgeon: Virl Cagey, MD;  Location: [facility] OR[facility];  Service: General;  Laterality: Right;    SOCIAL HISTORY:  Social History   Socioeconomic History  . Marital status: Single    Spouse name: Not on file  . Number of children: Not on file  . Years of education: Not on file  . Highest education level: Not on file  Occupational History  . Occupation: Marine scientist, Insurance underwriter  Tobacco Use  . Smoking status: Never Smoker  .  Smokeless tobacco: Former Systems developer    Types: Secondary school teacher  . Vaping Use: Never used  Substance and Sexual Activity  . Alcohol use: No  . Drug use: No  . Sexual activity: Not Currently  Other Topics Concern  . Not on file  Social History Narrative  . Not on file   Social Determinants of Health   Financial Resource Strain: Low Risk   . Difficulty of Paying Living Expenses: Not hard at all  Food Insecurity: No Food Insecurity  . Worried About Charity fundraiser in the Last Year: Never true  . Ran Out of Food in the Last Year: Never true  Transportation Needs: No Transportation Needs  . Lack of Transportation (Medical): No  . Lack of Transportation (Non-Medical): No  Physical Activity: Inactive  . Days of Exercise per Week: 0 days  . Minutes of Exercise per Session: 0 min  Stress: No Stress Concern Present  . Feeling of Stress : Not at all  Social Connections: Moderately Integrated  . Frequency of Communication with Friends and Family: More than three times a week  .  Frequency of Social Gatherings with Friends and Family: More than three times a week  . Attends Religious Services: More than 4 times per year  . Active Member of Clubs or Organizations: Yes  . Attends Archivist Meetings: More than 4 times per year  . Marital Status: Never married  Intimate Partner Violence: Not At Risk  . Fear of Current or Ex-Partner: No  . Emotionally Abused: No  . Physically Abused: No  . Sexually Abused: No    FAMILY HISTORY:  Family History  Problem Relation Age of Onset  . Heart failure Mother 72  . Dementia Father   . Colon cancer Neg Hx     CURRENT MEDICATIONS:  Current Outpatient Medications  Medication Sig Dispense Refill  . acetaminophen (TYLENOL) 325 MG tablet Take 650 mg by mouth every 6 (six) hours as needed.    [name] acyclovir (ZOVIRAX) 200 MG capsule Take 1 capsule (200 mg total) by mouth 2 (two) times daily. 60 capsule 3  . alfuzosin (UROXATRAL) 10 MG 24 hr  tablet Take 1 tablet (10 mg total) by mouth daily with breakfast. 30 tablet 11  . allopurinol (ZYLOPRIM) 100 MG tablet Take 2 tablets (200 mg total) by mouth every morning. 60 tablet 0  . apixaban (ELIQUIS) 2.5 MG TABS tablet Take 1 tablet (2.5 mg total) by mouth daily. Start taking from tomorrow 10/06/2020    . B-D UF III MINI PEN NEEDLES 31G X 5 MM MISC   4  . cyclobenzaprine (FLEXERIL) 10 MG tablet TAKE 1 TABLET THREE TIMES DAILY AS NEEDED FOR MUSCLE SPASM 30 tablet 0  . dexamethasone (DECADRON) 4 MG tablet Take 5 tablets (20 mg total) by mouth once a week. 20 tablet 3  . dicyclomine (BENTYL) 10 MG capsule Take 10 mg by mouth 2 (two) times daily as needed.     . diphenoxylate-atropine (LOMOTIL) 2.5-0.025 MG tablet Take 2 tablets by mouth 4 (four) times daily as needed.     . ergocalciferol (VITAMIN D2) 1.25 MG (50000 UT) capsule Take by mouth.    . furosemide (LASIX) 40 MG tablet Take 1.5 tablets (60 mg total) by mouth daily. 45 tablet 1  . gabapentin (NEURONTIN) 300 MG capsule Take 1 capsule (300 mg total) by mouth at bedtime.    [name] glipiZIDE (GLUCOTROL XL) 10 MG 24 hr tablet TAKE (1) TABLET BY MOUTH DAILY WITH BREAKFAST. 30 tablet 0  . HYDROcodone-acetaminophen (NORCO) 10-325 MG tablet Take 1 tablet by mouth 2 (two) times daily as needed. 60 tablet 0  . lanthanum (FOSRENOL) 1000 MG chewable tablet     . lisinopril (ZESTRIL) 5 MG tablet Take 1 tablet by mouth daily.    . Multiple Vitamins-Minerals (CENTRUM SILVER 50+MEN) TABS Take 1 tablet by mouth every morning.    [name] NOVOLOG FLEXPEN 100 UNIT/ML FlexPen Inject 8 Units into the skin 3 (three) times daily with meals.     . pomalidomide (POMALYST) 2 MG capsule     . pravastatin (PRAVACHOL) 20 MG tablet Take 20 mg by mouth at bedtime.     . selinexor (XPOVIO) 40 MG TBPK Take 40 mg by mouth once a week. 4 each 0  . sodium bicarbonate 650 MG tablet Take by mouth.    . traZODone (DESYREL) 100 MG tablet TAKE ONE TABLET BY MOUTH AT BEDTIME. 30 tablet 0   . TRESIBA FLEXTOUCH 200 UNIT/ML FlexTouch Pen Inject 16 Units into the skin daily.    . TRUE METRIX BLOOD GLUCOSE TEST test strip     .  VELPHORO 500 MG chewable tablet Chew 1,000 mg by mouth 3 (three) times daily.     No current facility-administered medications for this visit.   Facility-Administered Medications Ordered in Other Visits  Medication Dose Route Frequency Provider Last Rate Last Admin  . 0.9 %  sodium chloride infusion  250 mL Intravenous Once Twana First, MD        ALLERGIES:  No Known Allergies  PHYSICAL EXAM:  Performance status (ECOG): 1 - Symptomatic but completely ambulatory  Vitals:   12/21/20 0916  BP: (!) 122/57  Pulse: (!) 103  Resp: 20  Temp: 97.8 F (36.6 C)  SpO2: 98%   Wt Readings from Last 3 Encounters:  12/21/20 223 lb 1.7 oz (101.2 kg)  12/07/20 224 lb 13.9 oz (102 kg)  11/30/20 215 lb 9.8 oz (97.8 kg)   Physical Exam Vitals reviewed.  Constitutional:      Appearance: Normal appearance. He is obese.  Cardiovascular:     Rate and Rhythm: Normal rate and regular rhythm.     Pulses: Normal pulses.     Heart sounds: Normal heart sounds.  Pulmonary:     Effort: Pulmonary effort is normal.     Breath sounds: Normal breath sounds.  Chest:     Comments: Port-a-Cath in R chest Musculoskeletal:     Right lower leg: Edema (2+) present.     Left lower leg: Edema (2+) present.  Neurological:     General: No focal deficit present.     Mental Status: He is alert and oriented to person, place, and time.  Psychiatric:        Mood and Affect: Mood normal.        Behavior: Behavior normal.     LABORATORY DATA:  I have reviewed the labs as listed.  CBC Latest Ref Rng & Units 12/21/2020 12/14/2020 12/07/2020  WBC 4.0 - 10.5 K/uL 3.0(L) 3.1(L) 3.0(L)  Hemoglobin 13.0 - 17.0 g/dL 7.7(L) 6.3(LL) 6.1(LL)  Hematocrit 39.0 - 52.0 % 24.1(L) 19.6(L) 19.1(L)  Platelets 150 - 400 K/uL 65(L) 42(L) 28(LL)   CMP Latest Ref Rng & Units 12/21/2020 12/07/2020  11/30/2020  Glucose 70 - 99 mg/dL 145(H) 60(L) 133(H)  BUN 8 - 23 mg/dL 24(H) 34(H) 28(H)  Creatinine 0.61 - 1.24 mg/dL 5.68(H) 6.25(H) 5.51(H)  Sodium 135 - 145 mmol/L 141 138 135  Potassium 3.5 - 5.1 mmol/L 3.4(L) 3.7 3.5  Chloride 98 - 111 mmol/L 97(L) 94(L) 94(L)  CO2 22 - 32 mmol/L _0 Calcium 8.9 - 10.3 mg/dL 9.5 9.2 9.6  Total Protein 6.5 - 8.1 g/dL 6.4(L) 6.5 6.8  Total Bilirubin 0.3 - 1.2 mg/dL 0.8 0.8 1.0  Alkaline Phos 38 - 126 U/L 97 70 72  AST 15 - 41 U/L 14(L) 14(L) 14(L)  ALT 0 - 44 U/L _1 Lab Results  Component Value Date   TOTALPROTELP 5.2 (L) 12/15/2020   ALBUMINELP 3.2 12/15/2020   A1GS 0.2 12/15/2020   A2GS 0.8 12/15/2020   BETS 0.7 12/15/2020   GAMS 0.3 (L) 12/15/2020   MSPIKE 0.2 (H) 12/15/2020   SPEI Comment 12/15/2020    Lab Results  Component Value Date   KPAFRELGTCHN Note: (A) 12/15/2020   LAMBDASER 11.3 12/15/2020   KAPLAMBRATIO 1,962.02 (H) 12/15/2020    DIAGNOSTIC IMAGING:  I have independently reviewed the scans and discussed with the patient. MR Lumbar Spine Wo Contrast  Result Date: 11/30/2020 CLINICAL DATA:  Multiple myeloma. EXAM: MRI LUMBAR SPINE WITHOUT CONTRAST  TECHNIQUE: Multiplanar, multisequence MR imaging of the lumbar spine was performed. No intravenous contrast was administered. COMPARISON:  None. FINDINGS: Segmentation: The inferior-most fully formed intervertebral disc is labeled L5-S1. Alignment:  Normal Vertebrae: Diffusely abnormal/T1 hypointense marrow signal throughout the visualized thoracolumbar spine and sacrum sacrum with interspersed patchy preserved fatty marrow. Vertebral body heights are maintained. No focal/linear STIR hyperintensity to suggest acute fracture. Conus medullaris and cauda equina: Conus extends to the superior L2 level. Conus appears normal. Paraspinal and other soft tissues: No acute abnormality. Disc levels: T12-L1: No significant disc protrusion, foraminal stenosis, or canal stenosis. L1-L2:  Mild disc bulging without significant canal or foraminal stenosis. L2-L3: Mild right eccentric disc bulging without significant canal or foraminal stenosis. L3-L4: Disc height loss. Broad disc bulge with superimposed right paracentral disc protrusion. Moderate bilateral facet hypertrophy and marked ligamentum flavum thickening. Resulting moderate to severe canal stenosis. Moderate left and mild right foraminal stenosis. L4-L5: Broad-based disc bulge with mild bilateral facet hypertrophy and bilateral facet joint effusions. Ligamentum flavum thickening. Mild canal stenosis. No significant foraminal stenosis. L5-S1: Broad-based disc bulge with moderate bilateral facet hypertrophy and ligamentum flavum thickening. No significant canal or foraminal stenosis. IMPRESSION: 1. Evidence of diffuse multiple myeloma involvement in the visualized thoracolumbar spine and sacrum. No prior lumbar MRI is available for comparison. No evidence of pathologic fracture. No specific evidence of epidural/canal involvement, although postcontrast imaging could provide more sensitive evaluation if clinically indicated. 2. Multilevel degenerative change. At L3-L4 there is moderate to severe canal stenosis, moderate left and mild right foraminal stenosis. Electronically Signed   By: Margaretha Sheffield MD   On: 11/30/2020 15:12   ECHOCARDIOGRAM COMPLETE  Result Date: 11/28/2020    ECHOCARDIOGRAM REPORT   Patient Name:   Tom Marshall Date of Exam: 11/28/2020 Medical Rec #:  [id number]        Height:       68.0 in Accession #:    [id number]       Weight:       224.8 lb Date of Birth:  Dec 03, 1950        BSA:          2.148 m Patient Age:    70 years         BP:           141/68 mmHg Patient Gender: M                HR:           107 bpm. Exam Location:  [name] Procedure: 2D Echo, Cardiac Doppler and Color Doppler Indications:    Chemo Z09  History:        Patient has no prior history of Echocardiogram examinations.                 Risk  Factors:Diabetes and Dyslipidemia. History of Cancer-                 Multiple myeloma not having achieved remission, CKD (chronic                 kidney disease).  Sonographer:    Alvino Chapel RCS Referring Phys: [phone] [place]  1. Left ventricular ejection fraction, by estimation, is 65 to 70%. The left ventricle has normal function. The left ventricle has no regional wall motion abnormalities. Left ventricular diastolic parameters were normal.  2. Right ventricular systolic function is normal. The right ventricular size is normal. Tricuspid regurgitation signal is inadequate  for assessing PA pressure.  3. The mitral valve is grossly normal. Trivial mitral valve regurgitation.  4. The aortic valve is tricuspid. Aortic valve regurgitation is not visualized. Mild to moderate aortic valve sclerosis/calcification is present, without any evidence of aortic stenosis. Aortic valve mean gradient measures 11.0 mmHg.  5. The inferior vena cava is normal in size with greater than 50% respiratory variability, suggesting right atrial pressure of 3 mmHg. FINDINGS  Left Ventricle: Left ventricular ejection fraction, by estimation, is 65 to 70%. The left ventricle has normal function. The left ventricle has no regional wall motion abnormalities. The left ventricular internal cavity size was normal in size. There is  borderline left ventricular hypertrophy. Left ventricular diastolic parameters were normal. Right Ventricle: The right ventricular size is normal. No increase in right ventricular wall thickness. Right ventricular systolic function is normal. Tricuspid regurgitation signal is inadequate for assessing PA pressure. Left Atrium: Left atrial size was normal in size. Right Atrium: Right atrial size was normal in size. Pericardium: There is no evidence of pericardial effusion. Mitral Valve: The mitral valve is grossly normal. Trivial mitral valve regurgitation. Tricuspid Valve: The tricuspid valve  is grossly normal. Tricuspid valve regurgitation is trivial. Aortic Valve: The aortic valve is tricuspid. There is mild aortic valve annular calcification. Aortic valve regurgitation is not visualized. Mild to moderate aortic valve sclerosis/calcification is present, without any evidence of aortic stenosis. Aortic  valve mean gradient measures 11.0 mmHg. Aortic valve peak gradient measures 20.4 mmHg. Aortic valve area, by VTI measures 1.94 cm. Pulmonic Valve: The pulmonic valve was grossly normal. Pulmonic valve regurgitation is trivial. Aorta: The aortic root is normal in size and structure. Venous: The inferior vena cava is normal in size with greater than 50% respiratory variability, suggesting right atrial pressure of 3 mmHg. IAS/Shunts: No atrial level shunt detected by color flow Doppler.  LEFT VENTRICLE PLAX 2D LVIDd:         5.60 cm  Diastology LVIDs:         2.70 cm  LV e' lateral:   12.30 cm/s LV PW:         1.00 cm  LV E/e' lateral: 10.3 LV IVS:        1.00 cm LVOT diam:     2.00 cm LV SV:         81 LV SV Index:   38 LVOT Area:     3.14 cm  RIGHT VENTRICLE RV S prime:     14.90 cm/s TAPSE (M-mode): 2.3 cm LEFT ATRIUM           Index       RIGHT ATRIUM           Index LA diam:      3.70 cm 1.72 cm/m  RA Area:     16.70 cm LA Vol (A2C): 41.1 ml 19.14 ml/m RA Volume:   42.20 ml  19.65 ml/m LA Vol (A4C): 66.6 ml 31.01 ml/m  AORTIC VALVE AV Area (Vmax):    1.85 cm AV Area (Vmean):   1.97 cm AV Area (VTI):     1.94 cm AV Vmax:           226.00 cm/s AV Vmean:          154.000 cm/s AV VTI:            0.417 m AV Peak Grad:      20.4 mmHg AV Mean Grad:      11.0 mmHg LVOT Vmax:  133.00 cm/s LVOT Vmean:        96.800 cm/s LVOT VTI:          0.258 m LVOT/AV VTI ratio: 0.62  AORTA Ao Root diam: 3.30 cm MITRAL VALVE MV Area (PHT): 3.76 cm     SHUNTS MV Decel Time: 202 msec     Systemic VTI:  0.26 m MV E velocity: 127.00 cm/s  Systemic Diam: 2.00 cm MV A velocity: 163.00 cm/s MV E/A ratio:  0.78  Rozann Lesches MD Electronically signed by Rozann Lesches MD Signature Date/Time: 11/28/2020/11:43:10 AM    Final      ASSESSMENT:  1. IgG kappa multiple myeloma: -4 cycles of RVD from 08/30/2017 through 02/18/2018. -Declined bone marrow transplant. Received maintenance Revlimid 2.5 mg 3 weeks on 1 week off. -Bone marrow biopsy on 02/23/2020 shows 21% plasma cells. Occasional sections had up to 75% myeloma cells. -Chromosome analysis was normal. FISH panel was positive for gain of 1 q., monosomy 13, del 17 P, t(14;20) -PET scan showed new left frontal destructive lesion. Numerous areas of lytic changes in the spine. Lucent areas in T6 with loss of height at T5. Numerous lytic changes throughout the spine noted along with pedicle and lamina and transverse process of T8 vertebral body. Profound hypermetabolic activity within the ribs bilaterally. -Daratumumab, pomalidomide and dexamethasone from 04/11/2020 through 09/05/2020 with progression. -Velcade, selinexor and dexamethasone from 10/01/2020 through 11/09/2020 with progression. -Bone marrow biopsy on 11/18/2020 at [facility] showed 95% plasma cells. -KCyDstarted on 11/23/2020. -2D echo on 11/28/2020 with EF 65 to 70%.  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 -Completed XRT on 04/05/2020. -MRI thoracic spine on 07/26/2020 showed significant overall improvement in multiple myeloma with much of the bone marrow has returned to normal fatty marrow. Ventral epidural tumor on the right of T5 near completely resolved. Proximal right T7 rib lesion also resolved. -MRI of the lumbosacral spine from 11/30/2020.  There is myelomatous involvement of the visualized thoracic, lumbar and sacral bones.  There is no  extradural mass.  There is severe spinal canal stenosis at L3-L4.  3. 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 He is on Eliquis.   PLAN:  1. IgG kappa multiple myeloma: -He has tolerated first cycle of carfilzomib, cyclophosphamide and dexamethasone very well. -I have reviewed myeloma labs from 12/15/2020.  M spike improved to 0.2 g from 0.3 g previously. -Free light chain ratio improved to 1962 from 2200 previously. -His white count and platelets also improved.  They started giving small doses of arthropods stimulating agents with dialysis.  Today's hemoglobin is 7.7.  He does not require any transfusion. -he will proceed with cycle 2 of chemotherapy today. -RTC 2 weeks for follow-up.  2.ESRD on HD: -Continue dialysis on Tuesday, Friday and Saturday.  3. Left leg DVT: -Eliquis on hold due to low platelet count.  His platelet count improved to 65.  If it continues to be high, will start back on Eliquis.  4.Diabetes: -Continue Tresiba and NovoLog.  5. Insomnia: -Continue trazodone 100 mg at bedtime.  6. Low back pain: -Continue hydrocodone 10 mg as needed. -He reported overall improvement in his pain.  He is able to walk well even without walker.   Orders placed this encounter:  No orders  of the defined types were placed in this encounter.    Derek Jack, MD [place] [phone]   I, Milinda Antis, am acting as a scribe for Dr. Sanda Linger.  I, Derek Jack MD, have reviewed the above documentation for accuracy and completeness, and I agree with the above.

## 2020-12-21 NOTE — Patient Instructions (Signed)
[facility] Discharge Instructions for Patients Receiving Chemotherapy  Today you received the following chemotherapy agents   To help prevent nausea and vomiting after your treatment, we encourage you to take your nausea medication   If you develop nausea and vomiting that is not controlled by your nausea medication, call the clinic.   BELOW ARE SYMPTOMS THAT SHOULD BE REPORTED IMMEDIATELY:  *FEVER GREATER THAN 100.5 F  *CHILLS WITH OR WITHOUT FEVER  NAUSEA AND VOMITING THAT IS NOT CONTROLLED WITH YOUR NAUSEA MEDICATION  *UNUSUAL SHORTNESS OF BREATH  *UNUSUAL BRUISING OR BLEEDING  TENDERNESS IN MOUTH AND THROAT WITH OR WITHOUT PRESENCE OF ULCERS  *URINARY PROBLEMS  *BOWEL PROBLEMS  UNUSUAL RASH Items with * indicate a potential emergency and should be followed up as soon as possible.  Feel free to call the clinic should you have any questions or concerns. The clinic phone number is [phone].  Please show the CHEMO ALERT CARD at check-in to the Emergency Department and triage nurse.   

## 2020-12-21 NOTE — Patient Instructions (Signed)
[place] at [facility] Discharge Instructions  You were seen today by Dr. Delton Coombes. He went over your recent results. You received your treatment today; continue getting your treatment every week. Dr. Delton Coombes will see you back in 2 weeks for labs and follow up.   Thank you for choosing [place] at [facility] to provide your oncology and hematology care.  To afford each patient quality time with our provider, please arrive at least 15 minutes before your scheduled appointment time.   If you have a lab appointment with the [place] please come in thru the Main Entrance and check in at the main information desk  You need to re-schedule your appointment should you arrive 10 or more minutes late.  We strive to give you quality time with our providers, and arriving late affects you and other patients whose appointments are after yours.  Also, if you no show three or more times for appointments you may be dismissed from the clinic at the providers discretion.     Again, thank you for choosing [facility].  Our hope is that these requests will decrease the amount of time that you wait before being seen by our physicians.       _____________________________________________________________  Should you have questions after your visit to [facility], please contact our office at [phone] between the hours of 8:00 a.m. and 4:30 p.m.  Voicemails left after 4:00 p.m. will not be returned until the following business day.  For prescription refill requests, have your pharmacy contact our office and allow 72 hours.    Cancer Center Support Programs:   > Cancer Support Group  2nd Tuesday of the month 1pm-2pm, Journey Room

## 2020-12-21 NOTE — Progress Notes (Signed)
Patient presents today for Kyprolis/Cytoxan infusion.  Vital signs and labs within parameters  for treatment except creatinine which was noted to be 5.68.  Patients only complaint today is fatigue.  Message received from [place] patient okay for treatment.  Cytoxan hung.  Checked with pharmacy to see if it was okay that Cytoxan was hung first.  Per pharmacy Kyprolis should be hung first but it was okay that the Cytoxan was first.  Treatment completed 1340.  Patient dysphoretic and not waking up.  Vital signs were WNL except O2 sat which was 89.  2 liters of O2 was placed on the patient and saturation came up to 95 percent.  Blood glucose was obtain from PORT and sent to the lab.  Patient was given an amp of D50.  Patient immediately woke up and started talking.  Blood glucose was noted to be 162.  Dr. Delton Coombes came in to see the patient.  Patient joking and back to base line.    Treatment given today per MD orders.  Tolerated infusion without adverse affects.  Vital signs stable.  No complaints at this time.  Discharge from clinic via wheelchair in stable condition.  Alert and oriented X 3.  Follow up with [facility] as scheduled.

## 2020-12-21 NOTE — Progress Notes (Signed)
Patient was assessed by Dr. Katragadda and labs have been reviewed.  Patient is okay to proceed with treatment today. Primary RN and pharmacy aware.   

## 2020-12-23 DIAGNOSIS — D649 Anemia, unspecified: Secondary | ICD-10-CM | POA: Diagnosis not present

## 2020-12-23 DIAGNOSIS — N178 Other acute kidney failure: Secondary | ICD-10-CM | POA: Diagnosis not present

## 2020-12-23 DIAGNOSIS — N2581 Secondary hyperparathyroidism of renal origin: Secondary | ICD-10-CM | POA: Diagnosis not present

## 2020-12-23 DIAGNOSIS — Z992 Dependence on renal dialysis: Secondary | ICD-10-CM | POA: Diagnosis not present

## 2020-12-23 DIAGNOSIS — Z23 Encounter for immunization: Secondary | ICD-10-CM | POA: Diagnosis not present

## 2020-12-24 DIAGNOSIS — D649 Anemia, unspecified: Secondary | ICD-10-CM | POA: Diagnosis not present

## 2020-12-24 DIAGNOSIS — Z23 Encounter for immunization: Secondary | ICD-10-CM | POA: Diagnosis not present

## 2020-12-24 DIAGNOSIS — N178 Other acute kidney failure: Secondary | ICD-10-CM | POA: Diagnosis not present

## 2020-12-24 DIAGNOSIS — Z992 Dependence on renal dialysis: Secondary | ICD-10-CM | POA: Diagnosis not present

## 2020-12-24 DIAGNOSIS — N2581 Secondary hyperparathyroidism of renal origin: Secondary | ICD-10-CM | POA: Diagnosis not present

## 2020-12-27 DIAGNOSIS — Z5112 Encounter for antineoplastic immunotherapy: Secondary | ICD-10-CM | POA: Diagnosis not present

## 2020-12-27 DIAGNOSIS — D631 Anemia in chronic kidney disease: Secondary | ICD-10-CM | POA: Diagnosis not present

## 2020-12-27 DIAGNOSIS — Z992 Dependence on renal dialysis: Secondary | ICD-10-CM | POA: Diagnosis not present

## 2020-12-27 DIAGNOSIS — N179 Acute kidney failure, unspecified: Secondary | ICD-10-CM | POA: Diagnosis not present

## 2020-12-27 DIAGNOSIS — Z5111 Encounter for antineoplastic chemotherapy: Secondary | ICD-10-CM | POA: Diagnosis not present

## 2020-12-27 DIAGNOSIS — Z23 Encounter for immunization: Secondary | ICD-10-CM | POA: Diagnosis not present

## 2020-12-27 DIAGNOSIS — E119 Type 2 diabetes mellitus without complications: Secondary | ICD-10-CM | POA: Diagnosis not present

## 2020-12-27 DIAGNOSIS — N2581 Secondary hyperparathyroidism of renal origin: Secondary | ICD-10-CM | POA: Diagnosis not present

## 2020-12-27 DIAGNOSIS — C9000 Multiple myeloma not having achieved remission: Secondary | ICD-10-CM | POA: Diagnosis not present

## 2020-12-27 DIAGNOSIS — N186 End stage renal disease: Secondary | ICD-10-CM | POA: Diagnosis not present

## 2020-12-28 ENCOUNTER — Inpatient Hospital Stay (HOSPITAL_COMMUNITY): Payer: Medicare Other | Attending: Hematology

## 2020-12-28 ENCOUNTER — Other Ambulatory Visit: Payer: Self-pay

## 2020-12-28 ENCOUNTER — Inpatient Hospital Stay (HOSPITAL_COMMUNITY): Payer: Medicare Other

## 2020-12-28 ENCOUNTER — Encounter (HOSPITAL_COMMUNITY): Payer: Self-pay

## 2020-12-28 VITALS — BP 113/66 | HR 89 | Temp 97.4°F | Resp 17

## 2020-12-28 DIAGNOSIS — C9000 Multiple myeloma not having achieved remission: Secondary | ICD-10-CM | POA: Diagnosis not present

## 2020-12-28 DIAGNOSIS — Z5111 Encounter for antineoplastic chemotherapy: Secondary | ICD-10-CM | POA: Diagnosis not present

## 2020-12-28 DIAGNOSIS — Z5112 Encounter for antineoplastic immunotherapy: Secondary | ICD-10-CM | POA: Diagnosis not present

## 2020-12-28 DIAGNOSIS — D649 Anemia, unspecified: Secondary | ICD-10-CM

## 2020-12-28 LAB — COMPREHENSIVE METABOLIC PANEL
ALT: 19 U/L (ref 0–44)
AST: 17 U/L (ref 15–41)
Albumin: 3.2 g/dL — ABNORMAL LOW (ref 3.5–5.0)
Alkaline Phosphatase: 85 U/L (ref 38–126)
Anion gap: 15 (ref 5–15)
BUN: 26 mg/dL — ABNORMAL HIGH (ref 8–23)
CO2: 30 mmol/L (ref 22–32)
Calcium: 8.2 mg/dL — ABNORMAL LOW (ref 8.9–10.3)
Chloride: 94 mmol/L — ABNORMAL LOW (ref 98–111)
Creatinine, Ser: 5.31 mg/dL — ABNORMAL HIGH (ref 0.61–1.24)
GFR, Estimated: 11 mL/min — ABNORMAL LOW (ref >60)
Glucose, Bld: 165 mg/dL — ABNORMAL HIGH (ref 70–99)
Potassium: 3.6 mmol/L (ref 3.5–5.1)
Sodium: 139 mmol/L (ref 135–145)
Total Bilirubin: 1 mg/dL (ref 0.3–1.2)
Total Protein: 6 g/dL — ABNORMAL LOW (ref 6.5–8.1)

## 2020-12-28 LAB — CBC WITH DIFFERENTIAL/PLATELET
Abs Immature Granulocytes: 0.03 10*3/uL (ref 0.00–0.07)
Basophils Absolute: 0 10*3/uL (ref 0.0–0.1)
Basophils Relative: 0 %
Eosinophils Absolute: 0 10*3/uL (ref 0.0–0.5)
Eosinophils Relative: 1 %
HCT: 21.3 % — ABNORMAL LOW (ref 39.0–52.0)
Hemoglobin: 7 g/dL — ABNORMAL LOW (ref 13.0–17.0)
Immature Granulocytes: 1 %
Lymphocytes Relative: 21 %
Lymphs Abs: 0.7 10*3/uL (ref 0.7–4.0)
MCH: 32.3 pg (ref 26.0–34.0)
MCHC: 32.9 g/dL (ref 30.0–36.0)
MCV: 98.2 fL (ref 80.0–100.0)
Monocytes Absolute: 0.4 10*3/uL (ref 0.1–1.0)
Monocytes Relative: 11 %
Neutro Abs: 2.2 10*3/uL (ref 1.7–7.7)
Neutrophils Relative %: 66 %
Platelets: 40 10*3/uL — ABNORMAL LOW (ref 150–400)
RBC: 2.17 MIL/uL — ABNORMAL LOW (ref 4.22–5.81)
RDW: 18.3 % — ABNORMAL HIGH (ref 11.5–15.5)
WBC: 3.3 10*3/uL — ABNORMAL LOW (ref 4.0–10.5)
nRBC: 0 % (ref 0.0–0.2)

## 2020-12-28 LAB — PREPARE RBC (CROSSMATCH)

## 2020-12-28 LAB — SAMPLE TO BLOOD BANK

## 2020-12-28 MED ORDER — HEPARIN SOD (PORK) LOCK FLUSH 100 UNIT/ML IV SOLN
500.0000 [IU] | Freq: Once | INTRAVENOUS | Status: AC | PRN
  Administered 2020-12-28: 500 [IU]

## 2020-12-28 MED ORDER — DEXTROSE 5 % IV SOLN
56.0000 mg/m2 | Freq: Once | INTRAVENOUS | Status: AC
  Administered 2020-12-28: 120 mg via INTRAVENOUS
  Filled 2020-12-28: qty 60

## 2020-12-28 MED ORDER — SODIUM CHLORIDE 0.9 % IV SOLN
Freq: Once | INTRAVENOUS | Status: AC
  Administered 2020-12-28: 8 mg via INTRAVENOUS
  Filled 2020-12-28: qty 4

## 2020-12-28 MED ORDER — SODIUM CHLORIDE 0.9% FLUSH
10.0000 mL | INTRAVENOUS | Status: DC | PRN
  Administered 2020-12-28: 10 mL

## 2020-12-28 MED ORDER — SODIUM CHLORIDE 0.9 % IV SOLN: Freq: Once | INTRAVENOUS | Status: AC

## 2020-12-28 MED ORDER — SODIUM CHLORIDE 0.9 % IV SOLN
300.0000 mg/m2 | Freq: Once | INTRAVENOUS | Status: AC
  Administered 2020-12-28: 660 mg via INTRAVENOUS
  Filled 2020-12-28: qty 33

## 2020-12-28 MED ORDER — SODIUM CHLORIDE 0.9 % IV SOLN
40.0000 mg | Freq: Once | INTRAVENOUS | Status: AC
  Administered 2020-12-28: 40 mg via INTRAVENOUS
  Filled 2020-12-28: qty 4

## 2020-12-28 NOTE — Progress Notes (Signed)
Patient presents today for treatment Kyprolis/ Cytoxan. Vital signs within parameters for treatment today. Patient states, " I feel much better today." Patient ambulated to unit with no assist aids. Patient rates pain in his lower, left back 3/10 and ongoing. No complaints of any changes since his last treatment. Patient alert and oriented. Labs pending.   Labs reported to Dr. Delton Coombes. Creatinine 5.31, HGB 7.0. Platelets 40. Message received to schedule patient for 1 Unit of PRBC's tomorrow. Blood bank and scheduling notified. Orders placed.   Verbal order from Dr. Delton Coombes to proceed with treatment.   Treatment given today per MD orders. Tolerated infusion without adverse affects. Vital signs stable. No complaints at this time. Discharged from clinic via wheel chair in stable condition. Alert and oriented x 3. F/U with [facility] as scheduled.

## 2020-12-29 ENCOUNTER — Inpatient Hospital Stay (HOSPITAL_COMMUNITY): Payer: Medicare Other

## 2020-12-29 ENCOUNTER — Encounter (HOSPITAL_COMMUNITY): Payer: Self-pay

## 2020-12-29 DIAGNOSIS — C9000 Multiple myeloma not having achieved remission: Secondary | ICD-10-CM

## 2020-12-29 DIAGNOSIS — Z5112 Encounter for antineoplastic immunotherapy: Secondary | ICD-10-CM | POA: Diagnosis not present

## 2020-12-29 DIAGNOSIS — Z5111 Encounter for antineoplastic chemotherapy: Secondary | ICD-10-CM | POA: Diagnosis not present

## 2020-12-29 MED ORDER — SODIUM CHLORIDE 0.9% IV SOLUTION
250.0000 mL | Freq: Once | INTRAVENOUS | Status: AC
  Administered 2020-12-29: 250 mL via INTRAVENOUS

## 2020-12-29 MED ORDER — ACETAMINOPHEN 325 MG PO TABS
ORAL_TABLET | ORAL | Status: AC
  Filled 2020-12-29: qty 2

## 2020-12-29 MED ORDER — HEPARIN SOD (PORK) LOCK FLUSH 100 UNIT/ML IV SOLN
500.0000 [IU] | Freq: Every day | INTRAVENOUS | Status: AC | PRN
  Administered 2020-12-29: 500 [IU]

## 2020-12-29 MED ORDER — SODIUM CHLORIDE 0.9% FLUSH
10.0000 mL | INTRAVENOUS | Status: AC | PRN
  Administered 2020-12-29: 10 mL

## 2020-12-29 MED ORDER — ACETAMINOPHEN 325 MG PO TABS
650.0000 mg | ORAL_TABLET | Freq: Once | ORAL | Status: AC
  Administered 2020-12-29: 650 mg via ORAL

## 2020-12-29 MED ORDER — DIPHENHYDRAMINE HCL 25 MG PO CAPS
25.0000 mg | ORAL_CAPSULE | Freq: Once | ORAL | Status: AC
  Administered 2020-12-29: 25 mg via ORAL

## 2020-12-29 MED ORDER — DIPHENHYDRAMINE HCL 25 MG PO CAPS
ORAL_CAPSULE | ORAL | Status: AC
  Filled 2020-12-29: qty 1

## 2020-12-29 NOTE — Progress Notes (Signed)
Patient presents today for 1 unit of PRBC's. Vitals signs stable. Patient has no complaints of any changes since his last visit. MAR reviewed and updated.   1 Unit of PRBC's given today per MD orders. Tolerated infusion without adverse affects. Vital signs stable. No complaints at this time. Discharged from clinic via wheel chair in stable condition. Alert and oriented x 3. F/U with [facility] as scheduled.

## 2020-12-30 DIAGNOSIS — D631 Anemia in chronic kidney disease: Secondary | ICD-10-CM | POA: Diagnosis not present

## 2020-12-30 DIAGNOSIS — N186 End stage renal disease: Secondary | ICD-10-CM | POA: Diagnosis not present

## 2020-12-30 DIAGNOSIS — N2581 Secondary hyperparathyroidism of renal origin: Secondary | ICD-10-CM | POA: Diagnosis not present

## 2020-12-30 DIAGNOSIS — Z23 Encounter for immunization: Secondary | ICD-10-CM | POA: Diagnosis not present

## 2020-12-30 DIAGNOSIS — E119 Type 2 diabetes mellitus without complications: Secondary | ICD-10-CM | POA: Diagnosis not present

## 2020-12-30 DIAGNOSIS — Z992 Dependence on renal dialysis: Secondary | ICD-10-CM | POA: Diagnosis not present

## 2020-12-30 LAB — TYPE AND SCREEN
ABO/RH(D): A POS
Antibody Screen: POSITIVE
Unit division: 0

## 2020-12-30 LAB — BPAM RBC
Blood Product Expiration Date: 202203302359
ISSUE DATE / TIME: 202203030914
Unit Type and Rh: 6200

## 2020-12-31 DIAGNOSIS — N2581 Secondary hyperparathyroidism of renal origin: Secondary | ICD-10-CM | POA: Diagnosis not present

## 2020-12-31 DIAGNOSIS — Z992 Dependence on renal dialysis: Secondary | ICD-10-CM | POA: Diagnosis not present

## 2020-12-31 DIAGNOSIS — Z23 Encounter for immunization: Secondary | ICD-10-CM | POA: Diagnosis not present

## 2020-12-31 DIAGNOSIS — D631 Anemia in chronic kidney disease: Secondary | ICD-10-CM | POA: Diagnosis not present

## 2020-12-31 DIAGNOSIS — N186 End stage renal disease: Secondary | ICD-10-CM | POA: Diagnosis not present

## 2020-12-31 DIAGNOSIS — E119 Type 2 diabetes mellitus without complications: Secondary | ICD-10-CM | POA: Diagnosis not present

## 2021-01-02 ENCOUNTER — Other Ambulatory Visit (HOSPITAL_COMMUNITY): Payer: Self-pay | Admitting: Hematology

## 2021-01-03 DIAGNOSIS — Z23 Encounter for immunization: Secondary | ICD-10-CM | POA: Diagnosis not present

## 2021-01-03 DIAGNOSIS — N186 End stage renal disease: Secondary | ICD-10-CM | POA: Diagnosis not present

## 2021-01-03 DIAGNOSIS — Z992 Dependence on renal dialysis: Secondary | ICD-10-CM | POA: Diagnosis not present

## 2021-01-03 DIAGNOSIS — E119 Type 2 diabetes mellitus without complications: Secondary | ICD-10-CM | POA: Diagnosis not present

## 2021-01-03 DIAGNOSIS — N2581 Secondary hyperparathyroidism of renal origin: Secondary | ICD-10-CM | POA: Diagnosis not present

## 2021-01-03 DIAGNOSIS — D631 Anemia in chronic kidney disease: Secondary | ICD-10-CM | POA: Diagnosis not present

## 2021-01-04 ENCOUNTER — Other Ambulatory Visit (HOSPITAL_COMMUNITY): Payer: Self-pay | Admitting: Hematology

## 2021-01-04 ENCOUNTER — Inpatient Hospital Stay (HOSPITAL_BASED_OUTPATIENT_CLINIC_OR_DEPARTMENT_OTHER): Payer: Medicare Other | Admitting: Hematology

## 2021-01-04 ENCOUNTER — Other Ambulatory Visit: Payer: Self-pay

## 2021-01-04 ENCOUNTER — Other Ambulatory Visit (HOSPITAL_COMMUNITY): Payer: Medicare Other

## 2021-01-04 ENCOUNTER — Inpatient Hospital Stay (HOSPITAL_COMMUNITY): Payer: Medicare Other

## 2021-01-04 VITALS — BP 103/53 | HR 106 | Temp 97.5°F | Resp 18 | Wt 222.0 lb

## 2021-01-04 VITALS — BP 102/59 | HR 94 | Temp 98.9°F | Resp 17

## 2021-01-04 DIAGNOSIS — C9000 Multiple myeloma not having achieved remission: Secondary | ICD-10-CM | POA: Diagnosis not present

## 2021-01-04 DIAGNOSIS — Z5112 Encounter for antineoplastic immunotherapy: Secondary | ICD-10-CM | POA: Diagnosis not present

## 2021-01-04 DIAGNOSIS — Z5111 Encounter for antineoplastic chemotherapy: Secondary | ICD-10-CM | POA: Diagnosis not present

## 2021-01-04 DIAGNOSIS — D649 Anemia, unspecified: Secondary | ICD-10-CM

## 2021-01-04 LAB — CBC WITH DIFFERENTIAL/PLATELET
Basophils Absolute: 0 10*3/uL (ref 0.0–0.1)
Basophils Relative: 0 %
Eosinophils Absolute: 0.1 10*3/uL (ref 0.0–0.5)
Eosinophils Relative: 2 %
HCT: 22.9 % — ABNORMAL LOW (ref 39.0–52.0)
Hemoglobin: 7.4 g/dL — ABNORMAL LOW (ref 13.0–17.0)
Lymphocytes Relative: 13 %
Lymphs Abs: 0.5 10*3/uL — ABNORMAL LOW (ref 0.7–4.0)
MCH: 31.9 pg (ref 26.0–34.0)
MCHC: 32.3 g/dL (ref 30.0–36.0)
MCV: 98.7 fL (ref 80.0–100.0)
Monocytes Absolute: 0.5 10*3/uL (ref 0.1–1.0)
Monocytes Relative: 13 %
Neutro Abs: 2.8 10*3/uL (ref 1.7–7.7)
Neutrophils Relative %: 71 %
Platelets: 37 10*3/uL — ABNORMAL LOW (ref 150–400)
RBC: 2.32 MIL/uL — ABNORMAL LOW (ref 4.22–5.81)
RDW: 18.2 % — ABNORMAL HIGH (ref 11.5–15.5)
WBC: 3.8 10*3/uL — ABNORMAL LOW (ref 4.0–10.5)
nRBC: 0 /100 WBC

## 2021-01-04 LAB — COMPREHENSIVE METABOLIC PANEL
ALT: 18 U/L (ref 0–44)
AST: 13 U/L — ABNORMAL LOW (ref 15–41)
Albumin: 3.4 g/dL — ABNORMAL LOW (ref 3.5–5.0)
Alkaline Phosphatase: 91 U/L (ref 38–126)
Anion gap: 14 (ref 5–15)
BUN: 31 mg/dL — ABNORMAL HIGH (ref 8–23)
CO2: 25 mmol/L (ref 22–32)
Calcium: 8.8 mg/dL — ABNORMAL LOW (ref 8.9–10.3)
Chloride: 98 mmol/L (ref 98–111)
Creatinine, Ser: 5.87 mg/dL — ABNORMAL HIGH (ref 0.61–1.24)
GFR, Estimated: 10 mL/min — ABNORMAL LOW (ref >60)
Glucose, Bld: 149 mg/dL — ABNORMAL HIGH (ref 70–99)
Potassium: 3.7 mmol/L (ref 3.5–5.1)
Sodium: 137 mmol/L (ref 135–145)
Total Bilirubin: 1.1 mg/dL (ref 0.3–1.2)
Total Protein: 6.3 g/dL — ABNORMAL LOW (ref 6.5–8.1)

## 2021-01-04 LAB — SAMPLE TO BLOOD BANK

## 2021-01-04 LAB — PREPARE RBC (CROSSMATCH)

## 2021-01-04 MED ORDER — OXYCODONE HCL 10 MG PO TABS: 10.0000 mg | ORAL_TABLET | Freq: Two times a day (BID) | ORAL | 0 refills | Status: AC | PRN

## 2021-01-04 MED ORDER — SODIUM CHLORIDE 0.9 % IV SOLN
300.0000 mg/m2 | Freq: Once | INTRAVENOUS | Status: AC
  Administered 2021-01-04: 660 mg via INTRAVENOUS
  Filled 2021-01-04: qty 33

## 2021-01-04 MED ORDER — DEXTROSE 5 % IV SOLN
56.0000 mg/m2 | Freq: Once | INTRAVENOUS | Status: AC
  Administered 2021-01-04: 120 mg via INTRAVENOUS
  Filled 2021-01-04: qty 60

## 2021-01-04 MED ORDER — SODIUM CHLORIDE 0.9% FLUSH
10.0000 mL | INTRAVENOUS | Status: DC | PRN
  Administered 2021-01-04: 10 mL

## 2021-01-04 MED ORDER — SODIUM CHLORIDE 0.9 % IV SOLN: Freq: Once | INTRAVENOUS | Status: AC

## 2021-01-04 MED ORDER — SODIUM CHLORIDE 0.9 % IV SOLN
Freq: Once | INTRAVENOUS | Status: AC
  Administered 2021-01-04: 8 mg via INTRAVENOUS
  Filled 2021-01-04: qty 4

## 2021-01-04 MED ORDER — HEPARIN SOD (PORK) LOCK FLUSH 100 UNIT/ML IV SOLN
500.0000 [IU] | Freq: Once | INTRAVENOUS | Status: AC | PRN
  Administered 2021-01-04: 500 [IU]

## 2021-01-04 MED ORDER — SODIUM CHLORIDE 0.9 % IV SOLN
40.0000 mg | Freq: Once | INTRAVENOUS | Status: AC
  Administered 2021-01-04: 40 mg via INTRAVENOUS
  Filled 2021-01-04: qty 4

## 2021-01-04 NOTE — Progress Notes (Signed)
[name], [place] [address]   CLINIC:  Medical Oncology/Hematology  PCP:  Asencion Noble, MD [address] / [place] [address] [phone]   REASON FOR VISIT:  Follow-up for multiple myeloma  PRIOR THERAPY:  1. Revlimid x 4 cycles from 09/05/2017 to 02/18/2018 with Revlimid maintenance. 2. Pomalyst from 06/20/2020 to 09/19/2020. 3. Darzalex x 6 cycles from 04/11/2020 to 09/05/2020. 4. Velcade x 2 cycles from 10/01/2020 to 11/09/2020.  NGS Results: Not done  CURRENT THERAPY: KCyD 3/4 weeks  BRIEF ONCOLOGIC HISTORY:  Oncology History  Multiple myeloma ([place])  08/29/2017 Initial Diagnosis   Multiple myeloma ([place])   12/10/2017 - 03/11/2018 Chemotherapy   The patient had dexamethasone (DECADRON) 4 MG tablet, 1 of 1 cycle, Start date: --, End date: -- lenalidomide (REVLIMID) 25 MG capsule, 1 of 1 cycle, Start date: --, End date: -- bortezomib SQ (VELCADE) chemo injection 2.75 mg, 1.3 mg/m2 = 2.75 mg, Subcutaneous,  Once, 5 of 5 cycles Administration: 2.75 mg (12/10/2017), 2.75 mg (12/17/2017), 2.75 mg (12/31/2017), 2.75 mg (01/21/2018), 2.75 mg (02/18/2018), 2.75 mg (03/11/2018)  for chemotherapy treatment.    04/11/2020 - 09/05/2020 Chemotherapy   The patient had daratumumab (DARZALEX) 800 mg in sodium chloride 0.9 % 960 mL (0.8 mg/mL) chemo infusion, 8 mg/kg = 800 mg (100 % of original dose 8 mg/kg), Intravenous, Once, 1 of 1 cycle Dose modification: 8 mg/kg (original dose 8 mg/kg, Cycle 1, Reason: Provider Judgment, Comment: giving total dose 9m/kg over 2 days) daratumumab (DARZALEX) 800 mg in sodium chloride 0.9 % 460 mL (1.6 mg/mL) chemo infusion, 8 mg/kg = 800 mg (50 % of original dose 16 mg/kg), Intravenous, Once, 2 of 2 cycles Dose modification: 8 mg/kg (original dose 16 mg/kg, Cycle 1, Reason: Provider Judgment, Comment: giving 16 mg/kg over 2 days. thus 8 mg/kg in 2 divided doses.) Administration: 1,600 mg (04/19/2020), 1,600 mg  (05/03/2020), 1,600 mg (05/09/2020), 800 mg (04/11/2020), 800 mg (04/12/2020) daratumumab (DARZALEX) 1,600 mg in sodium chloride 0.9 % 420 mL chemo infusion, 16 mg/kg = 1,600 mg, Intravenous, Once, 5 of 6 cycles Administration: 1,600 mg (05/16/2020), 1,600 mg (05/23/2020), 1,600 mg (05/30/2020), 1,600 mg (06/06/2020), 1,600 mg (06/13/2020), 1,600 mg (06/27/2020), 1,600 mg (07/11/2020), 1,600 mg (07/25/2020), 1,600 mg (08/08/2020), 1,600 mg (08/22/2020), 1,600 mg (09/05/2020)  for chemotherapy treatment.    09/30/2020 - 11/09/2020 Chemotherapy         11/23/2020 -  Chemotherapy    Patient is on Treatment Plan: MYELOMA RELAPSED/REFRACTORY CARFILZOMIB + DEXAMETHASONE (KD) WEEKLY Q28D        CANCER STAGING: Cancer Staging No matching staging information was found for the patient.  INTERVAL HISTORY:  [name] a 70y.o. male, returns for routine follow-up and consideration for next cycle of chemotherapy. WSeijiwas last seen on 12/21/2020.  Due for day #15 of cycle #2 of KCyD today.   Overall, he tells me he has been feeling okay. He reports having restless jumping in his legs this morning. He is tolerating the HD well and goes T/F/S. He is still able to walk, though he is having trouble balancing and needs his walker, though he continues having lower back pain which is exacerbated by the cold weather. He notes having 1 episode of diarrhea last week and denies having any F/C. His appetite is excellent and he is trying to walk more. He is taking Norco once in the morning, though it is not helping control the pain as well.  He has stopped Pomalyst.  Overall, he feels ready for next cycle of chemo today.    REVIEW OF SYSTEMS:  Review of Systems  Constitutional: Positive for fatigue (75%). Negative for appetite change, chills and fever.  Gastrointestinal: Positive for diarrhea (x1 episode last week).  Musculoskeletal: Positive for back pain (3/10 L flank pain) and gait problem (d/t issues w/  balance).  Neurological: Positive for gait problem (d/t issues w/ balance) and numbness (hands).  All other systems reviewed and are negative.   PAST MEDICAL/SURGICAL HISTORY:  Past Medical History:  Diagnosis Date  . Cancer ([place])    multiple myeloma  . Cancer of right kidney ([place])   . Cervical dystonia   . Diabetes mellitus without complication ([place])   . Gout   . Hypercholesteremia    Past Surgical History:  Procedure Laterality Date  . BONE MARROW BIOPSY    . CHOLECYSTECTOMY  2007  . COLONOSCOPY WITH PROPOFOL N/A 01/16/2018   Procedure: COLONOSCOPY WITH PROPOFOL;  Surgeon: Daneil Dolin, MD;  Location: [facility];  Service: Endoscopy;  Laterality: N/A;  1:45pm  . ESOPHAGOGASTRODUODENOSCOPY (EGD) WITH PROPOFOL N/A 01/16/2018   Procedure: ESOPHAGOGASTRODUODENOSCOPY (EGD) WITH PROPOFOL;  Surgeon: Daneil Dolin, MD;  Location: [facility];  Service: Endoscopy;  Laterality: N/A;  . GIVENS CAPSULE STUDY N/A 05/12/2018   Procedure: GIVENS CAPSULE STUDY;  Surgeon: Daneil Dolin, MD;  Location: [facility];  Service: Endoscopy;  Laterality: N/A;  7:30am  . IR FLUORO GUIDE CV LINE LEFT  09/29/2020  . IR FLUORO GUIDE CV LINE LEFT  10/05/2020  . IR US GUIDE VASC ACCESS LEFT  09/29/2020  . IR US GUIDE VASC ACCESS LEFT  10/05/2020  . NEPHRECTOMY Right 1998   cancer  . PORTACATH PLACEMENT Right 04/07/2020   Procedure: INSERTION PORT-A-CATH (attached catherter in right internal jugular);  Surgeon: Virl Cagey, MD;  Location: [facility] ORS;  Service: General;  Laterality: Right;    SOCIAL HISTORY:  Social History   Socioeconomic History  . Marital status: Single    Spouse name: Not on file  . Number of children: Not on file  . Years of education: Not on file  . Highest education level: Not on file  Occupational History  . Occupation: Marine scientist, Insurance underwriter  Tobacco Use  . Smoking status: Never Smoker  . Smokeless tobacco: Former Systems developer    Types: Secondary school teacher  . Vaping  Use: Never used  Substance and Sexual Activity  . Alcohol use: No  . Drug use: No  . Sexual activity: Not Currently  Other Topics Concern  . Not on file  Social History Narrative  . Not on file   Social Determinants of Health   Financial Resource Strain: Low Risk   . Difficulty of Paying Living Expenses: Not hard at all  Food Insecurity: No Food Insecurity  . Worried About Charity fundraiser in the Last Year: Never true  . Ran Out of Food in the Last Year: Never true  Transportation Needs: No Transportation Needs  . Lack of Transportation (Medical): No  . Lack of Transportation (Non-Medical): No  Physical Activity: Inactive  . Days of Exercise per Week: 0 days  . Minutes of Exercise per Session: 0 min  Stress: No Stress Concern Present  . Feeling of Stress : Not at all  Social Connections: Moderately Integrated  . Frequency of Communication with Friends and Family: More than three times a week  . Frequency of Social  Gatherings with Friends and Family: More than three times a week  . Attends Religious Services: More than 4 times per year  . Active Member of Clubs or Organizations: Yes  . Attends Archivist Meetings: More than 4 times per year  . Marital Status: Never married  Intimate Partner Violence: Not At Risk  . Fear of Current or Ex-Partner: No  . Emotionally Abused: No  . Physically Abused: No  . Sexually Abused: No    FAMILY HISTORY:  Family History  Problem Relation Age of Onset  . Heart failure Mother 8  . Dementia Father   . Colon cancer Neg Hx     CURRENT MEDICATIONS:  Current Outpatient Medications  Medication Sig Dispense Refill  . acetaminophen (TYLENOL) 325 MG tablet Take 650 mg by mouth every 6 (six) hours as needed.    [name] acyclovir (ZOVIRAX) 200 MG capsule Take 1 capsule (200 mg total) by mouth 2 (two) times daily. 60 capsule 3  . alfuzosin (UROXATRAL) 10 MG 24 hr tablet Take 1 tablet (10 mg total) by mouth daily with breakfast. 30  tablet 11  . allopurinol (ZYLOPRIM) 100 MG tablet Take 2 tablets (200 mg total) by mouth every morning. 60 tablet 0  . apixaban (ELIQUIS) 2.5 MG TABS tablet Take 1 tablet (2.5 mg total) by mouth daily. Start taking from tomorrow 10/06/2020    . B-D UF III MINI PEN NEEDLES 31G X 5 MM MISC   4  . cyclobenzaprine (FLEXERIL) 10 MG tablet TAKE 1 TABLET THREE TIMES DAILY AS NEEDED FOR MUSCLE SPASM 30 tablet 0  . dexamethasone (DECADRON) 4 MG tablet Take 5 tablets (20 mg total) by mouth once a week. 20 tablet 3  . dicyclomine (BENTYL) 10 MG capsule Take 10 mg by mouth 2 (two) times daily as needed.     . diphenoxylate-atropine (LOMOTIL) 2.5-0.025 MG tablet Take 2 tablets by mouth 4 (four) times daily as needed.     . ergocalciferol (VITAMIN D2) 1.25 MG (50000 UT) capsule Take by mouth.    . furosemide (LASIX) 40 MG tablet Take 1.5 tablets (60 mg total) by mouth daily. 45 tablet 1  . gabapentin (NEURONTIN) 300 MG capsule Take 1 capsule (300 mg total) by mouth at bedtime.    [name] glipiZIDE (GLUCOTROL XL) 10 MG 24 hr tablet TAKE (1) TABLET BY MOUTH DAILY WITH BREAKFAST. 30 tablet 11  . HYDROcodone-acetaminophen (NORCO) 10-325 MG tablet Take 1 tablet by mouth 2 (two) times daily as needed. 60 tablet 0  . lanthanum (FOSRENOL) 1000 MG chewable tablet     . lisinopril (ZESTRIL) 5 MG tablet Take 1 tablet by mouth daily.    . Multiple Vitamins-Minerals (CENTRUM SILVER 50+MEN) TABS Take 1 tablet by mouth every morning.    [name] NOVOLOG FLEXPEN 100 UNIT/ML FlexPen Inject 8 Units into the skin 3 (three) times daily with meals.     . pravastatin (PRAVACHOL) 20 MG tablet Take 20 mg by mouth at bedtime.     . sodium bicarbonate 650 MG tablet Take by mouth.    . traZODone (DESYREL) 100 MG tablet TAKE ONE TABLET BY MOUTH AT BEDTIME. 30 tablet 0  . TRESIBA FLEXTOUCH 200 UNIT/ML FlexTouch Pen Inject 16 Units into the skin daily.    . TRUE METRIX BLOOD GLUCOSE TEST test strip     . VELPHORO 500 MG chewable tablet Chew 1,000 mg  by mouth 3 (three) times daily.     No current facility-administered medications for this visit.  Facility-Administered Medications Ordered in Other Visits  Medication Dose Route Frequency Provider Last Rate Last Admin  . 0.9 %  sodium chloride infusion  250 mL Intravenous Once Twana First, MD      . carfilzomib (KYPROLIS) 120 mg in dextrose 5 % 100 mL chemo infusion  56 mg/m2 (Capped) Intravenous Once Derek Jack, MD      . cyclophosphamide (CYTOXAN) 660 mg in sodium chloride 0.9 % 250 mL chemo infusion  300 mg/m2 (Treatment Plan Recorded) Intravenous Once Derek Jack, MD      . dexamethasone (DECADRON) 40 mg in sodium chloride 0.9 % 50 mL IVPB  40 mg Intravenous Once Derek Jack, MD      . heparin lock flush 100 unit/mL  500 Units Intracatheter Once PRN Derek Jack, MD      . ondansetron (ZOFRAN) 8 mg in sodium chloride 0.9 % 50 mL IVPB   Intravenous Once Derek Jack, MD      . sodium chloride flush (NS) 0.9 % injection 10 mL  10 mL Intracatheter PRN Derek Jack, MD        ALLERGIES:  No Known Allergies  PHYSICAL EXAM:  Performance status (ECOG): 1 - Symptomatic but completely ambulatory  Vitals:   01/04/21 0932  BP: (!) 103/53  Pulse: (!) 106  Resp: 18  Temp: (!) 97.5 F (36.4 C)  SpO2: 92%   Wt Readings from Last 3 Encounters:  01/04/21 222 lb 0.1 oz (100.7 kg)  12/28/20 220 lb 0.3 oz (99.8 kg)  12/21/20 223 lb 1.7 oz (101.2 kg)   Physical Exam Vitals reviewed.  Constitutional:      Appearance: Normal appearance. He is obese.  Cardiovascular:     Rate and Rhythm: Normal rate and regular rhythm.     Pulses: Normal pulses.     Heart sounds: Normal heart sounds.  Pulmonary:     Effort: Pulmonary effort is normal.     Breath sounds: Normal breath sounds.  Chest:     Comments: Port-a-Cath in L chest Abdominal:     Palpations: Abdomen is soft.     Tenderness: There is left CVA tenderness.  Neurological:      General: No focal deficit present.     Mental Status: He is alert and oriented to person, place, and time.  Psychiatric:        Mood and Affect: Mood normal.        Behavior: Behavior normal.     LABORATORY DATA:  I have reviewed the labs as listed.  CBC Latest Ref Rng & Units 01/04/2021 12/28/2020 12/21/2020  WBC 4.0 - 10.5 K/uL 3.8(L) 3.3(L) 3.0(L)  Hemoglobin 13.0 - 17.0 g/dL 7.4(L) 7.0(L) 7.7(L)  Hematocrit 39.0 - 52.0 % 22.9(L) 21.3(L) 24.1(L)  Platelets 150 - 400 K/uL 37(L) 40(L) 65(L)   CMP Latest Ref Rng & Units 01/04/2021 12/28/2020 12/21/2020  Glucose 70 - 99 mg/dL 149(H) 165(H) 162(H)  BUN 8 - 23 mg/dL 31(H) 26(H) -  Creatinine 0.61 - 1.24 mg/dL 5.87(H) 5.31(H) -  Sodium 135 - 145 mmol/L 137 139 -  Potassium 3.5 - 5.1 mmol/L 3.7 3.6 -  Chloride 98 - 111 mmol/L 98 94(L) -  CO2 22 - 32 mmol/L 25 30 -  Calcium 8.9 - 10.3 mg/dL 8.8(L) 8.2(L) -  Total Protein 6.5 - 8.1 g/dL 6.3(L) 6.0(L) -  Total Bilirubin 0.3 - 1.2 mg/dL 1.1 1.0 -  Alkaline Phos 38 - 126 U/L 91 85 -  AST 15 - 41 U/L 13(L) 17 -  ALT 0 - 44 U/L 18 19 -    DIAGNOSTIC IMAGING:  I have independently reviewed the scans and discussed with the patient. No results found.   ASSESSMENT:  1. IgG kappa multiple myeloma: -4 cycles of RVD from 08/30/2017 through 02/18/2018. -Declined bone marrow transplant. Received maintenance Revlimid 2.5 mg 3 weeks on 1 week off. -Bone marrow biopsy on 02/23/2020 shows 21% plasma cells. Occasional sections had up to 75% myeloma cells. -Chromosome analysis was normal. FISH panel was positive for gain of 1 q., monosomy 13, del 17 P, t(14;20) -PET scan showed new left frontal destructive lesion. Numerous areas of lytic changes in the spine. Lucent areas in T6 with loss of height at T5. Numerous lytic changes throughout the spine noted along with pedicle and lamina and transverse process of T8 vertebral body. Profound hypermetabolic activity within the ribs bilaterally. -Daratumumab,  pomalidomide and dexamethasone from 04/11/2020 through 09/05/2020 with progression. -Velcade, selinexor and dexamethasone from 10/01/2020 through 11/09/2020 with progression. -Bone marrow biopsy on 11/18/2020 at [facility] showed 95% plasma cells. -KCyDstarted on 11/23/2020. -2D echo on 11/28/2020 with EF 65 to 70%.  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 -Completed XRT on 04/05/2020. -MRI thoracic spine on 07/26/2020 showed significant overall improvement in multiple myeloma with much of the bone marrow has returned to normal fatty marrow. Ventral epidural tumor on the right of T5 near completely resolved. Proximal right T7 rib lesion also resolved. -MRI of the lumbosacral spine from 11/30/2020. There is myelomatous involvement of the visualized thoracic, lumbar and sacral bones. There is no extradural mass. There is severe spinal canal stenosis at L3-L4.  3. 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 He is on Eliquis.   PLAN:  1. IgG kappa multiple myeloma: -He is tolerating carfilzomib cyclophosphamide and dexamethasone reasonably well. -Last myeloma labs showed slight improvement in M spike and light chains. -Labs today shows white count 3.8 with ANC of 2.8.  Platelet count is 37 and hemoglobin 7.4. -We will plan to transfuse him 1 unit tomorrow.  LFTs are normal. -He will proceed with next treatment today. -We will repeat myeloma labs in 2 weeks prior to cycle 3.  I plan to see him back in 3 weeks for follow-up to discuss results.  2.ESRD on HD: -Continue dialysis on Tuesday, Friday and Saturday.  3. Left leg DVT: -Eliquis on hold due to low platelet count.  4.Diabetes: -Continue Tresiba and NovoLog.  5.  Insomnia: -Continue trazodone 100 mg at bedtime.  6. Low back pain: -He reports low back pain is slightly worse today, mainly on the left side. -Hydrocodone is not helping.  Will change to oxycodone 10 mg twice daily as needed.   Orders placed this encounter:  No orders of the defined types were placed in this encounter.    Derek Jack, MD [place] [phone]   I, Milinda Antis, am acting as a scribe for Dr. Sanda Linger.  I, Derek Jack MD, have reviewed the above documentation for accuracy and completeness, and I agree with the above.

## 2021-01-04 NOTE — Patient Instructions (Signed)
[place] at [facility] Discharge Instructions  You were seen today by Dr. Delton Coombes. He went over your recent results. You received your treatment today. You will be prescribed oxycodone 10 mg to take twice daily for your pain; STOP taking the Norco (hydrocodone/acetaminophen). You will have labs drawn in 1 week to follow-up on your myeloma. Dr. Delton Coombes will see you back in 2 weeks for labs and follow up.   Thank you for choosing [place] at [facility] to provide your oncology and hematology care.  To afford each patient quality time with our provider, please arrive at least 15 minutes before your scheduled appointment time.   If you have a lab appointment with the [place] please come in thru the Main Entrance and check in at the main information desk  You need to re-schedule your appointment should you arrive 10 or more minutes late.  We strive to give you quality time with our providers, and arriving late affects you and other patients whose appointments are after yours.  Also, if you no show three or more times for appointments you may be dismissed from the clinic at the providers discretion.     Again, thank you for choosing [facility].  Our hope is that these requests will decrease the amount of time that you wait before being seen by our physicians.       _____________________________________________________________  Should you have questions after your visit to [facility], please contact our office at [phone] between the hours of 8:00 a.m. and 4:30 p.m.  Voicemails left after 4:00 p.m. will not be returned until the following business day.  For prescription refill requests, have your pharmacy contact our office and allow 72 hours.    Cancer Center Support Programs:   > Cancer Support Group  2nd Tuesday of the month 1pm-2pm, Journey Room

## 2021-01-04 NOTE — Progress Notes (Signed)
Pt here for C2D15 of kyprolis and cytoxan.  Creatinine 5.87. Okay for treatment today.  Tolerated treatment without incidence today.  Vital signs stable prior to discharge.  Discharged in stable condition via wheelchair.

## 2021-01-04 NOTE — Progress Notes (Signed)
Patient was assessed by Dr. Katragadda and labs have been reviewed. Treatment pending labs. Primary RN and pharmacy aware.   

## 2021-01-04 NOTE — Patient Instructions (Signed)
[facility] Discharge Instructions for Patients Receiving Chemotherapy  Today you received the following chemotherapy agents kyprolis and cytoxan.  Return to the clinic tomorrow to have 1 unit of blood at 1015.    To help prevent nausea and vomiting after your treatment, we encourage you to take your nausea medication    If you develop nausea and vomiting that is not controlled by your nausea medication, call the clinic.   BELOW ARE SYMPTOMS THAT SHOULD BE REPORTED IMMEDIATELY:  *FEVER GREATER THAN 100.5 F  *CHILLS WITH OR WITHOUT FEVER  NAUSEA AND VOMITING THAT IS NOT CONTROLLED WITH YOUR NAUSEA MEDICATION  *UNUSUAL SHORTNESS OF BREATH  *UNUSUAL BRUISING OR BLEEDING  TENDERNESS IN MOUTH AND THROAT WITH OR WITHOUT PRESENCE OF ULCERS  *URINARY PROBLEMS  *BOWEL PROBLEMS  UNUSUAL RASH Items with * indicate a potential emergency and should be followed up as soon as possible.  Feel free to call the clinic should you have any questions or concerns. The clinic phone number is [phone].  Please show the [place] at check-in to the Emergency Department and triage nurse.

## 2021-01-04 NOTE — Progress Notes (Signed)
Patient has been examined, vital signs and labs have been reviewed by Dr. Katragadda. ANC, Creatinine, LFTs, hemoglobin, and platelets are within treatment parameters per Dr. Katragadda. Patient is okay to proceed with treatment per M.D.   

## 2021-01-05 ENCOUNTER — Inpatient Hospital Stay (HOSPITAL_COMMUNITY): Payer: Medicare Other

## 2021-01-05 DIAGNOSIS — Z5112 Encounter for antineoplastic immunotherapy: Secondary | ICD-10-CM | POA: Diagnosis not present

## 2021-01-05 DIAGNOSIS — Z5111 Encounter for antineoplastic chemotherapy: Secondary | ICD-10-CM | POA: Diagnosis not present

## 2021-01-05 DIAGNOSIS — C9000 Multiple myeloma not having achieved remission: Secondary | ICD-10-CM | POA: Diagnosis not present

## 2021-01-05 DIAGNOSIS — D649 Anemia, unspecified: Secondary | ICD-10-CM

## 2021-01-05 MED ORDER — SODIUM CHLORIDE 0.9% IV SOLUTION
250.0000 mL | Freq: Once | INTRAVENOUS | Status: AC
  Administered 2021-01-05: 250 mL via INTRAVENOUS

## 2021-01-05 MED ORDER — ACETAMINOPHEN 325 MG PO TABS
650.0000 mg | ORAL_TABLET | Freq: Once | ORAL | Status: AC
  Administered 2021-01-05: 650 mg via ORAL

## 2021-01-05 MED ORDER — DIPHENHYDRAMINE HCL 25 MG PO CAPS
25.0000 mg | ORAL_CAPSULE | Freq: Once | ORAL | Status: AC
  Administered 2021-01-05: 25 mg via ORAL

## 2021-01-05 MED ORDER — HEPARIN SOD (PORK) LOCK FLUSH 100 UNIT/ML IV SOLN
500.0000 [IU] | Freq: Every day | INTRAVENOUS | Status: AC | PRN
  Administered 2021-01-05: 500 [IU]

## 2021-01-05 MED ORDER — SODIUM CHLORIDE 0.9% FLUSH
10.0000 mL | INTRAVENOUS | Status: AC | PRN
  Administered 2021-01-05: 10 mL

## 2021-01-05 NOTE — Progress Notes (Signed)
Tolerated transfusion 1 unit PRBC w/o adverse reaction.  Alert, in no distress.  VSS.  Discharged via wheelchair in stable condition.

## 2021-01-06 DIAGNOSIS — N186 End stage renal disease: Secondary | ICD-10-CM | POA: Diagnosis not present

## 2021-01-06 DIAGNOSIS — Z23 Encounter for immunization: Secondary | ICD-10-CM | POA: Diagnosis not present

## 2021-01-06 DIAGNOSIS — D631 Anemia in chronic kidney disease: Secondary | ICD-10-CM | POA: Diagnosis not present

## 2021-01-06 DIAGNOSIS — Z992 Dependence on renal dialysis: Secondary | ICD-10-CM | POA: Diagnosis not present

## 2021-01-06 DIAGNOSIS — E119 Type 2 diabetes mellitus without complications: Secondary | ICD-10-CM | POA: Diagnosis not present

## 2021-01-06 DIAGNOSIS — N2581 Secondary hyperparathyroidism of renal origin: Secondary | ICD-10-CM | POA: Diagnosis not present

## 2021-01-06 LAB — TYPE AND SCREEN
ABO/RH(D): A POS
Antibody Screen: NEGATIVE
Unit division: 0

## 2021-01-06 LAB — BPAM RBC
Blood Product Expiration Date: 202204082359
ISSUE DATE / TIME: 202203101032
Unit Type and Rh: 6200

## 2021-01-07 DIAGNOSIS — Z992 Dependence on renal dialysis: Secondary | ICD-10-CM | POA: Diagnosis not present

## 2021-01-07 DIAGNOSIS — E119 Type 2 diabetes mellitus without complications: Secondary | ICD-10-CM | POA: Diagnosis not present

## 2021-01-07 DIAGNOSIS — Z23 Encounter for immunization: Secondary | ICD-10-CM | POA: Diagnosis not present

## 2021-01-07 DIAGNOSIS — N2581 Secondary hyperparathyroidism of renal origin: Secondary | ICD-10-CM | POA: Diagnosis not present

## 2021-01-07 DIAGNOSIS — D631 Anemia in chronic kidney disease: Secondary | ICD-10-CM | POA: Diagnosis not present

## 2021-01-07 DIAGNOSIS — N186 End stage renal disease: Secondary | ICD-10-CM | POA: Diagnosis not present

## 2021-01-10 DIAGNOSIS — Z992 Dependence on renal dialysis: Secondary | ICD-10-CM | POA: Diagnosis not present

## 2021-01-10 DIAGNOSIS — D631 Anemia in chronic kidney disease: Secondary | ICD-10-CM | POA: Diagnosis not present

## 2021-01-10 DIAGNOSIS — E119 Type 2 diabetes mellitus without complications: Secondary | ICD-10-CM | POA: Diagnosis not present

## 2021-01-10 DIAGNOSIS — N2581 Secondary hyperparathyroidism of renal origin: Secondary | ICD-10-CM | POA: Diagnosis not present

## 2021-01-10 DIAGNOSIS — Z23 Encounter for immunization: Secondary | ICD-10-CM | POA: Diagnosis not present

## 2021-01-10 DIAGNOSIS — N186 End stage renal disease: Secondary | ICD-10-CM | POA: Diagnosis not present

## 2021-01-11 ENCOUNTER — Other Ambulatory Visit (HOSPITAL_COMMUNITY): Payer: Medicare Other

## 2021-01-12 ENCOUNTER — Encounter (HOSPITAL_COMMUNITY): Payer: Medicare Other

## 2021-01-12 DIAGNOSIS — E119 Type 2 diabetes mellitus without complications: Secondary | ICD-10-CM | POA: Diagnosis not present

## 2021-01-12 DIAGNOSIS — D631 Anemia in chronic kidney disease: Secondary | ICD-10-CM | POA: Diagnosis not present

## 2021-01-12 DIAGNOSIS — Z992 Dependence on renal dialysis: Secondary | ICD-10-CM | POA: Diagnosis not present

## 2021-01-12 DIAGNOSIS — N2581 Secondary hyperparathyroidism of renal origin: Secondary | ICD-10-CM | POA: Diagnosis not present

## 2021-01-12 DIAGNOSIS — Z23 Encounter for immunization: Secondary | ICD-10-CM | POA: Diagnosis not present

## 2021-01-12 DIAGNOSIS — N186 End stage renal disease: Secondary | ICD-10-CM | POA: Diagnosis not present

## 2021-01-14 DIAGNOSIS — D631 Anemia in chronic kidney disease: Secondary | ICD-10-CM | POA: Diagnosis not present

## 2021-01-14 DIAGNOSIS — Z23 Encounter for immunization: Secondary | ICD-10-CM | POA: Diagnosis not present

## 2021-01-14 DIAGNOSIS — E119 Type 2 diabetes mellitus without complications: Secondary | ICD-10-CM | POA: Diagnosis not present

## 2021-01-14 DIAGNOSIS — N186 End stage renal disease: Secondary | ICD-10-CM | POA: Diagnosis not present

## 2021-01-14 DIAGNOSIS — N2581 Secondary hyperparathyroidism of renal origin: Secondary | ICD-10-CM | POA: Diagnosis not present

## 2021-01-14 DIAGNOSIS — Z992 Dependence on renal dialysis: Secondary | ICD-10-CM | POA: Diagnosis not present

## 2021-01-17 ENCOUNTER — Other Ambulatory Visit: Payer: Self-pay | Admitting: *Deleted

## 2021-01-17 DIAGNOSIS — N2581 Secondary hyperparathyroidism of renal origin: Secondary | ICD-10-CM | POA: Diagnosis not present

## 2021-01-17 DIAGNOSIS — N186 End stage renal disease: Secondary | ICD-10-CM

## 2021-01-17 DIAGNOSIS — Z23 Encounter for immunization: Secondary | ICD-10-CM | POA: Diagnosis not present

## 2021-01-17 DIAGNOSIS — E119 Type 2 diabetes mellitus without complications: Secondary | ICD-10-CM | POA: Diagnosis not present

## 2021-01-17 DIAGNOSIS — D631 Anemia in chronic kidney disease: Secondary | ICD-10-CM | POA: Diagnosis not present

## 2021-01-17 DIAGNOSIS — Z992 Dependence on renal dialysis: Secondary | ICD-10-CM | POA: Diagnosis not present

## 2021-01-18 ENCOUNTER — Ambulatory Visit (HOSPITAL_COMMUNITY): Payer: Medicare Other

## 2021-01-18 ENCOUNTER — Ambulatory Visit (HOSPITAL_COMMUNITY): Payer: Medicare Other | Admitting: Hematology

## 2021-01-18 ENCOUNTER — Encounter (HOSPITAL_COMMUNITY): Payer: Self-pay

## 2021-01-18 ENCOUNTER — Inpatient Hospital Stay (HOSPITAL_COMMUNITY): Payer: Medicare Other

## 2021-01-18 ENCOUNTER — Other Ambulatory Visit (HOSPITAL_COMMUNITY): Payer: Medicare Other

## 2021-01-18 ENCOUNTER — Other Ambulatory Visit: Payer: Self-pay

## 2021-01-18 DIAGNOSIS — C9000 Multiple myeloma not having achieved remission: Secondary | ICD-10-CM | POA: Diagnosis not present

## 2021-01-18 DIAGNOSIS — Z5112 Encounter for antineoplastic immunotherapy: Secondary | ICD-10-CM | POA: Diagnosis not present

## 2021-01-18 DIAGNOSIS — Z5111 Encounter for antineoplastic chemotherapy: Secondary | ICD-10-CM | POA: Diagnosis not present

## 2021-01-18 LAB — CBC WITH DIFFERENTIAL/PLATELET
Abs Immature Granulocytes: 0.04 10*3/uL (ref 0.00–0.07)
Basophils Absolute: 0 10*3/uL (ref 0.0–0.1)
Basophils Relative: 0 %
Eosinophils Absolute: 0.1 10*3/uL (ref 0.0–0.5)
Eosinophils Relative: 2 %
HCT: 21.7 % — ABNORMAL LOW (ref 39.0–52.0)
Hemoglobin: 7.1 g/dL — ABNORMAL LOW (ref 13.0–17.0)
Immature Granulocytes: 1 %
Lymphocytes Relative: 18 %
Lymphs Abs: 0.6 10*3/uL — ABNORMAL LOW (ref 0.7–4.0)
MCH: 31.4 pg (ref 26.0–34.0)
MCHC: 32.7 g/dL (ref 30.0–36.0)
MCV: 96 fL (ref 80.0–100.0)
Monocytes Absolute: 0.5 10*3/uL (ref 0.1–1.0)
Monocytes Relative: 14 %
Neutro Abs: 2.2 10*3/uL (ref 1.7–7.7)
Neutrophils Relative %: 65 %
Platelets: 56 10*3/uL — ABNORMAL LOW (ref 150–400)
RBC: 2.26 MIL/uL — ABNORMAL LOW (ref 4.22–5.81)
RDW: 24 % — ABNORMAL HIGH (ref 11.5–15.5)
WBC: 3.4 10*3/uL — ABNORMAL LOW (ref 4.0–10.5)
nRBC: 0.9 % — ABNORMAL HIGH (ref 0.0–0.2)

## 2021-01-18 LAB — COMPREHENSIVE METABOLIC PANEL
ALT: 13 U/L (ref 0–44)
AST: 11 U/L — ABNORMAL LOW (ref 15–41)
Albumin: 3.5 g/dL (ref 3.5–5.0)
Alkaline Phosphatase: 91 U/L (ref 38–126)
Anion gap: 13 (ref 5–15)
BUN: 19 mg/dL (ref 8–23)
CO2: 26 mmol/L (ref 22–32)
Calcium: 8.5 mg/dL — ABNORMAL LOW (ref 8.9–10.3)
Chloride: 97 mmol/L — ABNORMAL LOW (ref 98–111)
Creatinine, Ser: 6.16 mg/dL — ABNORMAL HIGH (ref 0.61–1.24)
GFR, Estimated: 9 mL/min — ABNORMAL LOW (ref >60)
Glucose, Bld: 72 mg/dL (ref 70–99)
Potassium: 3.6 mmol/L (ref 3.5–5.1)
Sodium: 136 mmol/L (ref 135–145)
Total Bilirubin: 1 mg/dL (ref 0.3–1.2)
Total Protein: 6.4 g/dL — ABNORMAL LOW (ref 6.5–8.1)

## 2021-01-18 LAB — PREPARE RBC (CROSSMATCH)

## 2021-01-18 LAB — SAMPLE TO BLOOD BANK

## 2021-01-18 MED ORDER — SODIUM CHLORIDE 0.9% FLUSH
10.0000 mL | Freq: Once | INTRAVENOUS | Status: AC
  Administered 2021-01-18: 10 mL via INTRAVENOUS

## 2021-01-18 MED ORDER — HEPARIN SOD (PORK) LOCK FLUSH 100 UNIT/ML IV SOLN
500.0000 [IU] | Freq: Once | INTRAVENOUS | Status: AC
  Administered 2021-01-18: 500 [IU] via INTRAVENOUS

## 2021-01-18 NOTE — Progress Notes (Signed)
Patients port flushed without difficulty.  Good blood return noted with no bruising or swelling noted at site.  Band aid applied.  VSS with discharge and left in satisfactory condition with no s/s of distress noted.   

## 2021-01-18 NOTE — Addendum Note (Signed)
Addended by: Benjiman Core D on: 01/18/2021 02:37 PM   Modules accepted: Orders, SmartSet

## 2021-01-18 NOTE — Progress Notes (Signed)
HGB 7.1 today. Verbal orders received from Dr. Delton Coombes to infuse 1 unit of PRBC's tomorrow 01/19/21 with treatment. Orders placed and blood bank notified.

## 2021-01-18 NOTE — Patient Instructions (Signed)
[facility] at [facility]  Discharge Instructions:   _______________________________________________________________  Thank you for choosing [facility] at [facility] to provide your oncology and hematology care.  To afford each patient quality time with our providers, please arrive at least 15 minutes before your scheduled appointment.  You need to re-schedule your appointment if you arrive 10 or more minutes late.  We strive to give you quality time with our providers, and arriving late affects you and other patients whose appointments are after yours.  Also, if you no show three or more times for appointments you may be dismissed from the clinic.  Again, thank you for choosing  Cancer Center at [facility]. Our hope is that these requests will allow you access to exceptional care and in a timely manner. _______________________________________________________________  If you have questions after your visit, please contact our office at [phone] between the hours of 8:30 a.m. and 5:00 p.m. Voicemails left after 4:30 p.m. will not be returned until the following business day. _______________________________________________________________  For prescription refill requests, have your pharmacy contact our office. _______________________________________________________________  Recommendations made by the consultant and any test results will be sent to your referring physician. _______________________________________________________________ 

## 2021-01-19 ENCOUNTER — Inpatient Hospital Stay (HOSPITAL_COMMUNITY): Payer: Medicare Other

## 2021-01-19 ENCOUNTER — Ambulatory Visit (INDEPENDENT_AMBULATORY_CARE_PROVIDER_SITE_OTHER): Payer: Medicare Other

## 2021-01-19 VITALS — BP 95/49 | HR 84 | Temp 97.2°F | Resp 20

## 2021-01-19 DIAGNOSIS — N186 End stage renal disease: Secondary | ICD-10-CM

## 2021-01-19 DIAGNOSIS — Z5112 Encounter for antineoplastic immunotherapy: Secondary | ICD-10-CM | POA: Diagnosis not present

## 2021-01-19 DIAGNOSIS — C9000 Multiple myeloma not having achieved remission: Secondary | ICD-10-CM

## 2021-01-19 DIAGNOSIS — Z5111 Encounter for antineoplastic chemotherapy: Secondary | ICD-10-CM | POA: Diagnosis not present

## 2021-01-19 LAB — PROTEIN ELECTROPHORESIS, SERUM
A/G Ratio: 1.3 (ref 0.7–1.7)
Albumin ELP: 3.3 g/dL (ref 2.9–4.4)
Alpha-1-Globulin: 0.2 g/dL (ref 0.0–0.4)
Alpha-2-Globulin: 1.1 g/dL — ABNORMAL HIGH (ref 0.4–1.0)
Beta Globulin: 0.8 g/dL (ref 0.7–1.3)
Gamma Globulin: 0.4 g/dL (ref 0.4–1.8)
Globulin, Total: 2.5 g/dL (ref 2.2–3.9)
M-Spike, %: 0.2 g/dL — ABNORMAL HIGH
Total Protein ELP: 5.8 g/dL — ABNORMAL LOW (ref 6.0–8.5)

## 2021-01-19 LAB — KAPPA/LAMBDA LIGHT CHAINS
Kappa, lambda light chain ratio: 1708.27 — ABNORMAL HIGH (ref 0.26–1.65)
Lambda free light chains: 10 mg/L (ref 5.7–26.3)

## 2021-01-19 MED ORDER — ACETAMINOPHEN 325 MG PO TABS
650.0000 mg | ORAL_TABLET | Freq: Once | ORAL | Status: AC
  Administered 2021-01-19: 650 mg via ORAL
  Filled 2021-01-19: qty 2

## 2021-01-19 MED ORDER — SODIUM CHLORIDE 0.9% IV SOLUTION
250.0000 mL | Freq: Once | INTRAVENOUS | Status: AC
  Administered 2021-01-19: 250 mL via INTRAVENOUS

## 2021-01-19 MED ORDER — SODIUM CHLORIDE 0.9 % IV SOLN: Freq: Once | INTRAVENOUS | Status: AC

## 2021-01-19 MED ORDER — HEPARIN SOD (PORK) LOCK FLUSH 100 UNIT/ML IV SOLN
500.0000 [IU] | Freq: Once | INTRAVENOUS | Status: AC | PRN
  Administered 2021-01-19: 500 [IU]

## 2021-01-19 MED ORDER — SODIUM CHLORIDE 0.9 % IV SOLN
Freq: Once | INTRAVENOUS | Status: AC
  Administered 2021-01-19: 8 mg via INTRAVENOUS
  Filled 2021-01-19: qty 4

## 2021-01-19 MED ORDER — SODIUM CHLORIDE 0.9 % IV SOLN
40.0000 mg | Freq: Once | INTRAVENOUS | Status: AC
  Administered 2021-01-19: 40 mg via INTRAVENOUS
  Filled 2021-01-19: qty 4

## 2021-01-19 MED ORDER — DIPHENHYDRAMINE HCL 25 MG PO CAPS
25.0000 mg | ORAL_CAPSULE | Freq: Once | ORAL | Status: AC
  Administered 2021-01-19: 25 mg via ORAL
  Filled 2021-01-19: qty 1

## 2021-01-19 MED ORDER — SODIUM CHLORIDE 0.9% FLUSH
10.0000 mL | INTRAVENOUS | Status: DC | PRN
  Administered 2021-01-19: 10 mL

## 2021-01-19 MED ORDER — DEXTROSE 5 % IV SOLN
56.0000 mg/m2 | Freq: Once | INTRAVENOUS | Status: AC
  Administered 2021-01-19: 120 mg via INTRAVENOUS
  Filled 2021-01-19: qty 60

## 2021-01-19 MED ORDER — CYCLOPHOSPHAMIDE CHEMO INJECTION 1 GM
300.0000 mg/m2 | Freq: Once | INTRAMUSCULAR | Status: AC
  Administered 2021-01-19: 660 mg via INTRAVENOUS
  Filled 2021-01-19: qty 33

## 2021-01-19 NOTE — Patient Instructions (Signed)
[facility] Discharge Instructions for Patients Receiving Chemotherapy   Beginning January 23rd 2017 lab work for the [facility] will be done in the  Main lab at [facility] on 1st floor. If you have a lab appointment with the [facility] please come in thru the  Main Entrance and check in at the main information desk   Today you received the following chemotherapy agents Kyprolis and Cytoxan  To help prevent nausea and vomiting after your treatment, we encourage you to take your nausea medication    If you develop nausea and vomiting, or diarrhea that is not controlled by your medication, call the clinic.  The clinic phone number is [phone]. Office hours are Monday-Friday 8:30am-5:00pm.  BELOW ARE SYMPTOMS THAT SHOULD BE REPORTED IMMEDIATELY:  *FEVER GREATER THAN 101.0 F  *CHILLS WITH OR WITHOUT FEVER  NAUSEA AND VOMITING THAT IS NOT CONTROLLED WITH YOUR NAUSEA MEDICATION  *UNUSUAL SHORTNESS OF BREATH  *UNUSUAL BRUISING OR BLEEDING  TENDERNESS IN MOUTH AND THROAT WITH OR WITHOUT PRESENCE OF ULCERS  *URINARY PROBLEMS  *BOWEL PROBLEMS  UNUSUAL RASH Items with * indicate a potential emergency and should be followed up as soon as possible. If you have an emergency after office hours please contact your primary care physician or go to the nearest emergency department.  Please call the clinic during office hours if you have any questions or concerns.   You may also contact the Patient Navigator at [phone] should you have any questions or need assistance in obtaining follow up care.      Resources For Cancer Patients and their Caregivers ? American Cancer Society: Can assist with transportation, wigs, general needs, runs Look Good Feel Better.        985-199-3576 ? Cancer Care: Provides financial assistance, online support groups, medication/co-pay assistance.  1-800-813-HOPE 682-832-2951) ? [facility] Assists  [place] cancer patients and their families through emotional , educational and financial support.  304-844-5185 ? [place] DSS Where to apply for food stamps, Medicaid and utility assistance. 248-824-1369 ? [facility]: Transportation to medical appointments. 404 569 2775 ? Social Security Administration: May apply for disability if have a Stage IV cancer. 5348203073 581-021-4530 ? [employer], Disability and Transit Services: Assists with nutrition, care and transit needs. 262-419-5003

## 2021-01-19 NOTE — Progress Notes (Signed)
Tom Marshall presents today for D1C3 Kyprolis and Cytoxan. Pt denies any new changes or symptoms since last treatment. Lab results and vitals have been reviewed, incuding Cr 6.16, Hgb 7.1, and platelets 56; and are stable and within parameters for treatment. Per MD orders, the pt will receive one unit PRBC with treatment today. Patient was assessed by Dr. Delton Coombes yesterday who has approved proceeding with treatment today as planned.  Infusions tolerated without incident or complaint. VSS upon completion of treatment. Port flushed and deaccessed per protocol, see MAR and IV flowsheet for details. Discharged in satisfactory condition with follow up instructions.

## 2021-01-20 LAB — TYPE AND SCREEN
ABO/RH(D): A POS
Antibody Screen: NEGATIVE
Unit division: 0

## 2021-01-20 LAB — BPAM RBC
Blood Product Expiration Date: 202204082359
ISSUE DATE / TIME: 202203241144
Unit Type and Rh: 600

## 2021-01-21 DIAGNOSIS — E119 Type 2 diabetes mellitus without complications: Secondary | ICD-10-CM | POA: Diagnosis not present

## 2021-01-21 DIAGNOSIS — Z992 Dependence on renal dialysis: Secondary | ICD-10-CM | POA: Diagnosis not present

## 2021-01-21 DIAGNOSIS — N186 End stage renal disease: Secondary | ICD-10-CM | POA: Diagnosis not present

## 2021-01-21 DIAGNOSIS — Z23 Encounter for immunization: Secondary | ICD-10-CM | POA: Diagnosis not present

## 2021-01-21 DIAGNOSIS — N2581 Secondary hyperparathyroidism of renal origin: Secondary | ICD-10-CM | POA: Diagnosis not present

## 2021-01-21 DIAGNOSIS — D631 Anemia in chronic kidney disease: Secondary | ICD-10-CM | POA: Diagnosis not present

## 2021-01-23 ENCOUNTER — Ambulatory Visit (INDEPENDENT_AMBULATORY_CARE_PROVIDER_SITE_OTHER): Payer: Medicare Other | Admitting: Vascular Surgery

## 2021-01-23 ENCOUNTER — Encounter: Payer: Self-pay | Admitting: Vascular Surgery

## 2021-01-23 ENCOUNTER — Other Ambulatory Visit: Payer: Self-pay

## 2021-01-23 VITALS — BP 106/53 | HR 107 | Temp 97.9°F | Resp 18 | Ht 68.0 in | Wt 222.0 lb

## 2021-01-23 DIAGNOSIS — N186 End stage renal disease: Secondary | ICD-10-CM | POA: Diagnosis not present

## 2021-01-23 NOTE — Progress Notes (Signed)
[facility] [place]  Patient name: Tom Marshall MRN: [id number] DOB: [birth year] Sex: male  REASON FOR CONSULT: Discuss access for hemodialysis  Nephrologist: [place]  HPI: Tom Marshall is a 70 y.o. male, who is here today for discussion of hemodialysis access.  He has multiple myeloma and is felt to be responsible for his renal failure.  He has a tunneled hemodialysis catheter in his left internal jugular placed by interventional radiology in December 2021.  Initially thought there may be some renal recovery but this does not appear to be the case.  He is seen today for discussion of long-term access.  He is left-handed.  He does have a right IJ Port-A-Cath for cancer treatment and reports this has been present for over 1 year.  Past Medical History:  Diagnosis Date  . Cancer ([place])    multiple myeloma  . Cancer of right kidney ([place])   . Cervical dystonia   . Diabetes mellitus without complication ([place])   . Gout   . Hypercholesteremia     Family History  Problem Relation Age of Onset  . Heart failure Mother 43  . Dementia Father   . Colon cancer Neg Hx     SOCIAL HISTORY: Social History   Socioeconomic History  . Marital status: Single    Spouse name: Not on file  . Number of children: Not on file  . Years of education: Not on file  . Highest education level: Not on file  Occupational History  . Occupation: Marine scientist, Insurance underwriter  Tobacco Use  . Smoking status: Never Smoker  . Smokeless tobacco: Former Systems developer    Types: Secondary school teacher  . Vaping Use: Never used  Substance and Sexual Activity  . Alcohol use: No  . Drug use: No  . Sexual activity: Not Currently  Other Topics Concern  . Not on file  Social History Narrative  . Not on file   Social Determinants of Health   Financial Resource Strain: Low Risk   . Difficulty of Paying Living Expenses: Not hard at all  Food Insecurity: No Food  Insecurity  . Worried About Charity fundraiser in the Last Year: Never true  . Ran Out of Food in the Last Year: Never true  Transportation Needs: No Transportation Needs  . Lack of Transportation (Medical): No  . Lack of Transportation (Non-Medical): No  Physical Activity: Inactive  . Days of Exercise per Week: 0 days  . Minutes of Exercise per Session: 0 min  Stress: No Stress Concern Present  . Feeling of Stress : Not at all  Social Connections: Moderately Integrated  . Frequency of Communication with Friends and Family: More than three times a week  . Frequency of Social Gatherings with Friends and Family: More than three times a week  . Attends Religious Services: More than 4 times per year  . Active Member of Clubs or Organizations: Yes  . Attends Archivist Meetings: More than 4 times per year  . Marital Status: Never married  Intimate Partner Violence: Not At Risk  . Fear of Current or Ex-Partner: No  . Emotionally Abused: No  . Physically Abused: No  . Sexually Abused: No    No Known Allergies  Current Outpatient Medications  Medication Sig Dispense Refill  . acetaminophen (TYLENOL) 325 MG tablet Take 650 mg by mouth every 6 (six) hours as needed.    [name] acyclovir (ZOVIRAX) 200 MG capsule Take 1  capsule (200 mg total) by mouth 2 (two) times daily. 60 capsule 3  . alfuzosin (UROXATRAL) 10 MG 24 hr tablet Take 1 tablet (10 mg total) by mouth daily with breakfast. 30 tablet 11  . allopurinol (ZYLOPRIM) 100 MG tablet Take 2 tablets (200 mg total) by mouth every morning. 60 tablet 0  . apixaban (ELIQUIS) 2.5 MG TABS tablet Take 1 tablet (2.5 mg total) by mouth daily. Start taking from tomorrow 10/06/2020    . B-D UF III MINI PEN NEEDLES 31G X 5 MM MISC   4  . cyclobenzaprine (FLEXERIL) 10 MG tablet TAKE 1 TABLET BY MOUTH THREE TIMES DAILY AS NEEDED FOR MUSCLE SPASM 30 tablet 0  . dexamethasone (DECADRON) 4 MG tablet Take 5 tablets (20 mg total) by mouth once a week. 20  tablet 3  . dicyclomine (BENTYL) 10 MG capsule Take 10 mg by mouth 2 (two) times daily as needed.     . diphenoxylate-atropine (LOMOTIL) 2.5-0.025 MG tablet Take 2 tablets by mouth 4 (four) times daily as needed.     . ergocalciferol (VITAMIN D2) 1.25 MG (50000 UT) capsule Take by mouth.    . furosemide (LASIX) 40 MG tablet Take 1.5 tablets (60 mg total) by mouth daily. 45 tablet 1  . gabapentin (NEURONTIN) 300 MG capsule Take 1 capsule (300 mg total) by mouth at bedtime.    [name] glipiZIDE (GLUCOTROL XL) 10 MG 24 hr tablet TAKE (1) TABLET BY MOUTH DAILY WITH BREAKFAST. 30 tablet 11  . lanthanum (FOSRENOL) 1000 MG chewable tablet     . lisinopril (ZESTRIL) 5 MG tablet Take 1 tablet by mouth daily.    . Multiple Vitamins-Minerals (CENTRUM SILVER 50+MEN) TABS Take 1 tablet by mouth every morning.    [name] NOVOLOG FLEXPEN 100 UNIT/ML FlexPen Inject 8 Units into the skin 3 (three) times daily with meals.     . Oxycodone HCl 10 MG TABS Take 1 tablet (10 mg total) by mouth every 12 (twelve) hours as needed. 60 tablet 0  . pravastatin (PRAVACHOL) 20 MG tablet Take 20 mg by mouth at bedtime.     . sodium bicarbonate 650 MG tablet Take by mouth.    . traZODone (DESYREL) 100 MG tablet TAKE ONE TABLET BY MOUTH AT BEDTIME. 30 tablet 0  . TRESIBA FLEXTOUCH 200 UNIT/ML FlexTouch Pen Inject 16 Units into the skin daily.    . TRUE METRIX BLOOD GLUCOSE TEST test strip     . VELPHORO 500 MG chewable tablet Chew 1,000 mg by mouth 3 (three) times daily.     No current facility-administered medications for this visit.   Facility-Administered Medications Ordered in Other Visits  Medication Dose Route Frequency Provider Last Rate Last Admin  . 0.9 %  sodium chloride infusion  250 mL Intravenous Once Twana First, MD        REVIEW OF SYSTEMS:  _0  denotes positive finding, _1  denotes negative finding Cardiac  Comments:  Chest pain or chest pressure:    Shortness of breath upon exertion:    Short of breath when lying  flat:    Irregular heart rhythm:        Vascular    Pain in calf, thigh, or hip brought on by ambulation:    Pain in feet at night that wakes you up from your sleep:     Blood clot in your veins:    Leg swelling:         Pulmonary    Oxygen at home:  Productive cough:     Wheezing:         Neurologic    Sudden weakness in arms or legs:     Sudden numbness in arms or legs:     Sudden onset of difficulty speaking or slurred speech:    Temporary loss of vision in one eye:     Problems with dizziness:         Gastrointestinal    Blood in stool:     Vomited blood:         Genitourinary    Burning when urinating:     Blood in urine:        Psychiatric    Major depression:         Hematologic    Bleeding problems:    Problems with blood clotting too easily:        Skin    Rashes or ulcers:        Constitutional    Fever or chills:      PHYSICAL EXAM: Vitals:   01/23/21 1421  BP: (!) 106/53  Pulse: (!) 107  Resp: 18  Temp: 97.9 F (36.6 C)  TempSrc: Temporal  SpO2: 93%  Weight: 222 lb (100.7 kg)  Height: _0  (1.727 m)    GENERAL: The patient is a well-nourished male, in no acute distress. The vital signs are documented above. CARDIOVASCULAR: 2+ radial pulses bilaterally.  Moderate size cephalic vein at the wrist bilaterally. PULMONARY: There is good air exchange  MUSCULOSKELETAL: There are no major deformities or cyanosis. NEUROLOGIC: No focal weakness or paresthesias are detected. SKIN: There are no ulcers or rashes noted. PSYCHIATRIC: The patient has a normal affect.  DATA:  Normal arterial upper extremity studies in our noninvasive lab.  Adequate cephalic veins bilaterally for AV fistula creation  MEDICAL ISSUES: Had a very long discussion with the patient regarding access for hemodialysis.  Explained the limitation of catheter which she is currently using.  Explained the risk for section related to this.  He does appear to have adequate veins for  fistula attempt.  I discussed AV fistula creation versus AV graft placement.  Have recommended AV fistula creation.  He is left-handed but have recommended a left arm fistula due to his long-term port in the right internal jugular vein.  I feel that he could have use of this but it would be concerned regarding thrombosis of his vein over time.  He is on Eliquis.  We will hold this for surgery and schedule his left arm access at [facility] in the next several weeks at his convenience   Rosetta Posner, MD [facility] Tel [phone] Pager [phone]  Note: Portions of this report may have been transcribed using voice recognition software.  Every effort has been made to ensure accuracy; however, inadvertent computerized transcription errors may still be present.

## 2021-01-24 DIAGNOSIS — N2581 Secondary hyperparathyroidism of renal origin: Secondary | ICD-10-CM | POA: Diagnosis not present

## 2021-01-24 DIAGNOSIS — Z992 Dependence on renal dialysis: Secondary | ICD-10-CM | POA: Diagnosis not present

## 2021-01-24 DIAGNOSIS — E119 Type 2 diabetes mellitus without complications: Secondary | ICD-10-CM | POA: Diagnosis not present

## 2021-01-24 DIAGNOSIS — Z23 Encounter for immunization: Secondary | ICD-10-CM | POA: Diagnosis not present

## 2021-01-24 DIAGNOSIS — D631 Anemia in chronic kidney disease: Secondary | ICD-10-CM | POA: Diagnosis not present

## 2021-01-24 DIAGNOSIS — N186 End stage renal disease: Secondary | ICD-10-CM | POA: Diagnosis not present

## 2021-01-25 ENCOUNTER — Ambulatory Visit (HOSPITAL_COMMUNITY): Payer: Medicare Other

## 2021-01-25 ENCOUNTER — Ambulatory Visit (HOSPITAL_COMMUNITY): Payer: Medicare Other | Admitting: Hematology

## 2021-01-25 ENCOUNTER — Inpatient Hospital Stay (HOSPITAL_COMMUNITY): Payer: Medicare Other

## 2021-01-25 ENCOUNTER — Other Ambulatory Visit (HOSPITAL_COMMUNITY): Payer: Medicare Other

## 2021-01-25 ENCOUNTER — Inpatient Hospital Stay (HOSPITAL_BASED_OUTPATIENT_CLINIC_OR_DEPARTMENT_OTHER): Payer: Medicare Other | Admitting: Hematology

## 2021-01-25 ENCOUNTER — Other Ambulatory Visit: Payer: Self-pay

## 2021-01-25 ENCOUNTER — Other Ambulatory Visit (HOSPITAL_COMMUNITY): Payer: Self-pay

## 2021-01-25 VITALS — BP 93/52 | HR 84 | Temp 97.1°F | Resp 18

## 2021-01-25 VITALS — BP 108/49 | HR 95 | Temp 97.3°F | Resp 20 | Wt 218.7 lb

## 2021-01-25 DIAGNOSIS — Z5111 Encounter for antineoplastic chemotherapy: Secondary | ICD-10-CM | POA: Diagnosis not present

## 2021-01-25 DIAGNOSIS — Z5112 Encounter for antineoplastic immunotherapy: Secondary | ICD-10-CM | POA: Diagnosis not present

## 2021-01-25 DIAGNOSIS — C9000 Multiple myeloma not having achieved remission: Secondary | ICD-10-CM | POA: Diagnosis not present

## 2021-01-25 LAB — CBC WITH DIFFERENTIAL/PLATELET
Abs Immature Granulocytes: 0.03 10*3/uL (ref 0.00–0.07)
Basophils Absolute: 0 10*3/uL (ref 0.0–0.1)
Basophils Relative: 0 %
Eosinophils Absolute: 0.1 10*3/uL (ref 0.0–0.5)
Eosinophils Relative: 2 %
HCT: 22.2 % — ABNORMAL LOW (ref 39.0–52.0)
Hemoglobin: 7.2 g/dL — ABNORMAL LOW (ref 13.0–17.0)
Immature Granulocytes: 1 %
Lymphocytes Relative: 17 %
Lymphs Abs: 0.5 10*3/uL — ABNORMAL LOW (ref 0.7–4.0)
MCH: 30.8 pg (ref 26.0–34.0)
MCHC: 32.4 g/dL (ref 30.0–36.0)
MCV: 94.9 fL (ref 80.0–100.0)
Monocytes Absolute: 0.4 10*3/uL (ref 0.1–1.0)
Monocytes Relative: 12 %
Neutro Abs: 2.1 10*3/uL (ref 1.7–7.7)
Neutrophils Relative %: 68 %
Platelets: 36 10*3/uL — ABNORMAL LOW (ref 150–400)
RBC: 2.34 MIL/uL — ABNORMAL LOW (ref 4.22–5.81)
RDW: 22.1 % — ABNORMAL HIGH (ref 11.5–15.5)
WBC: 3.1 10*3/uL — ABNORMAL LOW (ref 4.0–10.5)
nRBC: 0.6 % — ABNORMAL HIGH (ref 0.0–0.2)

## 2021-01-25 LAB — COMPREHENSIVE METABOLIC PANEL
ALT: 14 U/L (ref 0–44)
AST: 10 U/L — ABNORMAL LOW (ref 15–41)
Albumin: 3.3 g/dL — ABNORMAL LOW (ref 3.5–5.0)
Alkaline Phosphatase: 83 U/L (ref 38–126)
Anion gap: 12 (ref 5–15)
BUN: 31 mg/dL — ABNORMAL HIGH (ref 8–23)
CO2: 28 mmol/L (ref 22–32)
Calcium: 8.7 mg/dL — ABNORMAL LOW (ref 8.9–10.3)
Chloride: 98 mmol/L (ref 98–111)
Creatinine, Ser: 5.77 mg/dL — ABNORMAL HIGH (ref 0.61–1.24)
GFR, Estimated: 10 mL/min — ABNORMAL LOW (ref >60)
Glucose, Bld: 65 mg/dL — ABNORMAL LOW (ref 70–99)
Potassium: 3.7 mmol/L (ref 3.5–5.1)
Sodium: 138 mmol/L (ref 135–145)
Total Bilirubin: 0.8 mg/dL (ref 0.3–1.2)
Total Protein: 5.9 g/dL — ABNORMAL LOW (ref 6.5–8.1)

## 2021-01-25 LAB — SAMPLE TO BLOOD BANK

## 2021-01-25 LAB — PREPARE RBC (CROSSMATCH)

## 2021-01-25 MED ORDER — SODIUM CHLORIDE 0.9 % IV SOLN: Freq: Once | INTRAVENOUS | Status: AC

## 2021-01-25 MED ORDER — GLIPIZIDE ER 5 MG PO TB24: 5.0000 mg | ORAL_TABLET | Freq: Every day | ORAL | 2 refills | Status: AC

## 2021-01-25 MED ORDER — SODIUM CHLORIDE 0.9% FLUSH
10.0000 mL | INTRAVENOUS | Status: DC | PRN
  Administered 2021-01-25: 10 mL

## 2021-01-25 MED ORDER — SODIUM CHLORIDE 0.9 % IV SOLN
300.0000 mg/m2 | Freq: Once | INTRAVENOUS | Status: AC
  Administered 2021-01-25: 660 mg via INTRAVENOUS
  Filled 2021-01-25: qty 33

## 2021-01-25 MED ORDER — CARFILZOMIB CHEMO INJECTION 60 MG
56.0000 mg/m2 | Freq: Once | INTRAVENOUS | Status: AC
  Administered 2021-01-25: 120 mg via INTRAVENOUS
  Filled 2021-01-25: qty 60

## 2021-01-25 MED ORDER — SODIUM CHLORIDE 0.9 % IV SOLN
Freq: Once | INTRAVENOUS | Status: AC
  Administered 2021-01-25: 8 mg via INTRAVENOUS
  Filled 2021-01-25: qty 4

## 2021-01-25 MED ORDER — HEPARIN SOD (PORK) LOCK FLUSH 100 UNIT/ML IV SOLN
500.0000 [IU] | Freq: Once | INTRAVENOUS | Status: AC | PRN
  Administered 2021-01-25: 500 [IU]

## 2021-01-25 MED ORDER — SODIUM CHLORIDE 0.9 % IV SOLN
40.0000 mg | Freq: Once | INTRAVENOUS | Status: AC
  Administered 2021-01-25: 40 mg via INTRAVENOUS
  Filled 2021-01-25: qty 4

## 2021-01-25 NOTE — Patient Instructions (Signed)
Tom Marshall Discharge Instructions for Patients Receiving Chemotherapy  Today you received the following chemotherapy agents kyprolis and cytoxan.  Return tomorrow at 9am for 1 unit of blood.  Please call the clinic with any questions or concerns.   To help prevent nausea and vomiting after your treatment, we encourage you to take your nausea medication    If you develop nausea and vomiting that is not controlled by your nausea medication, call the clinic.   BELOW ARE SYMPTOMS THAT SHOULD BE REPORTED IMMEDIATELY:  *FEVER GREATER THAN 100.5 F  *CHILLS WITH OR WITHOUT FEVER  NAUSEA AND VOMITING THAT IS NOT CONTROLLED WITH YOUR NAUSEA MEDICATION  *UNUSUAL SHORTNESS OF BREATH  *UNUSUAL BRUISING OR BLEEDING  TENDERNESS IN MOUTH AND THROAT WITH OR WITHOUT PRESENCE OF ULCERS  *URINARY PROBLEMS  *BOWEL PROBLEMS  UNUSUAL RASH Items with * indicate a potential emergency and should be followed up as soon as possible.  Feel free to call the clinic should you have any questions or concerns. The clinic phone number is [phone].  Please show the [place] at check-in to the Emergency Department and triage nurse.

## 2021-01-25 NOTE — Progress Notes (Signed)
Patient was assessed by Dr. Katragadda and labs have been reviewed. Treatment pending labs. Primary RN and pharmacy aware.   

## 2021-01-25 NOTE — Progress Notes (Signed)
Pt here for D8C3 of kyprolis and cytoxan.  Pt did fall since his last treatment.  He did not have any injury to himself with this fall except scratch on his nose.  Vital signs WNL for treatment today. Blood glucose 65.  Had pt eat some of his snacks he brought with him. Creatinine 5.77, hemoglobin 7.2, platelets 36.  Okay for treatment per Dr Raliegh Ip.  Will get 1 units PRBCs tomorrow.  Orders placed and blood bank notified.   1340 When cytoxan was completed, pt was hard to wake up.  Coke given and pt drank it.  Sat of side of the bed for about 10 minutes.  Pt alert and oriented x3 before being discharged.  Vital signs stable prior to discharge.  Discharged in stable condition via wheelchair. AVS reviewed.

## 2021-01-25 NOTE — Progress Notes (Signed)
[place], [place] [address]   CLINIC:  Medical Oncology/Hematology  PCP:  Asencion Noble, MD [address] / [place] [address] [phone]   REASON FOR VISIT:  Follow-up for multiple myeloma  PRIOR THERAPY:  1. Revlimid x 4 cycles from 09/05/2017 to 02/18/2018 with Revlimid maintenance. 2. Pomalyst from 06/20/2020 to 09/19/2020. 3. Darzalex x 6 cycles from 04/11/2020 to 09/05/2020. 4. Velcade x 2 cycles from 10/01/2020 to 11/09/2020.  NGS Results: Not done  CURRENT THERAPY: KCyD 3/4 weeks  BRIEF ONCOLOGIC HISTORY:  Oncology History  Multiple myeloma ([place])  08/29/2017 Initial Diagnosis   Multiple myeloma ([place])   12/10/2017 - 03/11/2018 Chemotherapy   The patient had dexamethasone (DECADRON) 4 MG tablet, 1 of 1 cycle, Start date: --, End date: -- lenalidomide (REVLIMID) 25 MG capsule, 1 of 1 cycle, Start date: --, End date: -- bortezomib SQ (VELCADE) chemo injection 2.75 mg, 1.3 mg/m2 = 2.75 mg, Subcutaneous,  Once, 5 of 5 cycles Administration: 2.75 mg (12/10/2017), 2.75 mg (12/17/2017), 2.75 mg (12/31/2017), 2.75 mg (01/21/2018), 2.75 mg (02/18/2018), 2.75 mg (03/11/2018)  for chemotherapy treatment.    04/11/2020 - 09/05/2020 Chemotherapy   The patient had daratumumab (DARZALEX) 800 mg in sodium chloride 0.9 % 960 mL (0.8 mg/mL) chemo infusion, 8 mg/kg = 800 mg (100 % of original dose 8 mg/kg), Intravenous, Once, 1 of 1 cycle Dose modification: 8 mg/kg (original dose 8 mg/kg, Cycle 1, Reason: Provider Judgment, Comment: giving total dose 25m/kg over 2 days) daratumumab (DARZALEX) 800 mg in sodium chloride 0.9 % 460 mL (1.6 mg/mL) chemo infusion, 8 mg/kg = 800 mg (50 % of original dose 16 mg/kg), Intravenous, Once, 2 of 2 cycles Dose modification: 8 mg/kg (original dose 16 mg/kg, Cycle 1, Reason: Provider Judgment, Comment: giving 16 mg/kg over 2 days. thus 8 mg/kg in 2 divided doses.) Administration: 1,600 mg (04/19/2020), 1,600 mg  (05/03/2020), 1,600 mg (05/09/2020), 800 mg (04/11/2020), 800 mg (04/12/2020) daratumumab (DARZALEX) 1,600 mg in sodium chloride 0.9 % 420 mL chemo infusion, 16 mg/kg = 1,600 mg, Intravenous, Once, 5 of 6 cycles Administration: 1,600 mg (05/16/2020), 1,600 mg (05/23/2020), 1,600 mg (05/30/2020), 1,600 mg (06/06/2020), 1,600 mg (06/13/2020), 1,600 mg (06/27/2020), 1,600 mg (07/11/2020), 1,600 mg (07/25/2020), 1,600 mg (08/08/2020), 1,600 mg (08/22/2020), 1,600 mg (09/05/2020)  for chemotherapy treatment.    09/30/2020 - 11/09/2020 Chemotherapy         11/23/2020 -  Chemotherapy    Patient is on Treatment Plan: MYELOMA RELAPSED/REFRACTORY CARFILZOMIB + DEXAMETHASONE (KD) WEEKLY Q28D        CANCER STAGING: Cancer Staging No matching staging information was found for the patient.  INTERVAL HISTORY:  Tom Marshall a 70y.o. male, returns for routine follow-up and consideration for next cycle of chemotherapy. WOmarionwas last seen on 01/04/2021.  Due for day #8 of cycle #3 of KCyD today.   Overall, he tells me he has been feeling okay. He reports falling after he reached down to grab a grocery bag and lost his balance and scrapped his nose which bled for several minutes; he has stopped taking Eliquis. He is tolerating the treatment well and denies having N/V; his appetite is excellent. He continues taking trazodone at bedtime. His back pain has improved this week and he continues taking oxycodone BID; he denies CP or SOB. He also notes having jerking movements in his right arm which occurs randomly.  He saw his nephrologist on 03/24.  Overall, he feels  ready for next cycle of chemo today.    REVIEW OF SYSTEMS:  Review of Systems  Constitutional: Positive for fatigue (40%). Negative for appetite change.  Respiratory: Negative for shortness of breath.   Cardiovascular: Negative for chest pain.  Gastrointestinal: Negative for nausea and vomiting.  Musculoskeletal: Positive for back pain  (improved).  Neurological: Positive for numbness (tingling).       Jerking movement in R arm  Psychiatric/Behavioral: Positive for sleep disturbance (on trazodone).  All other systems reviewed and are negative.   PAST MEDICAL/SURGICAL HISTORY:  Past Medical History:  Diagnosis Date  . Cancer ([place])    multiple myeloma  . Cancer of right kidney ([place])   . Cervical dystonia   . Diabetes mellitus without complication ([place])   . Gout   . Hypercholesteremia    Past Surgical History:  Procedure Laterality Date  . BONE MARROW BIOPSY    . CHOLECYSTECTOMY  2007  . COLONOSCOPY WITH PROPOFOL N/A 01/16/2018   Procedure: COLONOSCOPY WITH PROPOFOL;  Surgeon: Daneil Dolin, MD;  Location[facility];  Service: Endoscopy;  Laterality: N/A;  1:45pm  . ESOPHAGOGASTRODUODENOSCOPY (EGD) WITH PROPOFOL N/A 01/16/2018   Procedure: ESOPHAGOGASTRODUODENOSCOPY (EGD) WITH PROPOFOL;  Surgeon: Daneil Dolin, MD;  Location: [facility];  Service: Endoscopy;  Laterality: N/A;  . GIVENS CAPSULE STUDY N/A 05/12/2018   Procedure: GIVENS CAPSULE STUDY;  Surgeon: Daneil Dolin, MD;  Location: [facility];  Service: Endoscopy;  Laterality: N/A;  7:30am  . IR FLUORO GUIDE CV LINE LEFT  09/29/2020  . IR FLUORO GUIDE CV LINE LEFT  10/05/2020  . IR US GUIDE VASC ACCESS LEFT  09/29/2020  . IR US GUIDE VASC ACCESS LEFT  10/05/2020  . NEPHRECTOMY Right 1998   cancer  . PORTACATH PLACEMENT Right 04/07/2020   Procedure: INSERTION PORT-A-CATH (attached catherter in right internal jugular);  Surgeon: Virl Cagey, MD;  Location: [facility];  Service: General;  Laterality: Right;    SOCIAL HISTORY:  Social History   Socioeconomic History  . Marital status: Single    Spouse name: Not on file  . Number of children: Not on file  . Years of education: Not on file  . Highest education level: Not on file  Occupational History  . Occupation: Marine scientist, Insurance underwriter  Tobacco Use  . Smoking status: Never Smoker  .  Smokeless tobacco: Former Systems developer    Types: Secondary school teacher  . Vaping Use: Never used  Substance and Sexual Activity  . Alcohol use: No  . Drug use: No  . Sexual activity: Not Currently  Other Topics Concern  . Not on file  Social History Narrative  . Not on file   Social Determinants of Health   Financial Resource Strain: Low Risk   . Difficulty of Paying Living Expenses: Not hard at all  Food Insecurity: No Food Insecurity  . Worried About Charity fundraiser in the Last Year: Never true  . Ran Out of Food in the Last Year: Never true  Transportation Needs: No Transportation Needs  . Lack of Transportation (Medical): No  . Lack of Transportation (Non-Medical): No  Physical Activity: Inactive  . Days of Exercise per Week: 0 days  . Minutes of Exercise per Session: 0 min  Stress: No Stress Concern Present  . Feeling of Stress : Not at all  Social Connections: Moderately Integrated  . Frequency of Communication with Friends and Family: More than three times a week  .  Frequency of Social Gatherings with Friends and Family: More than three times a week  . Attends Religious Services: More than 4 times per year  . Active Member of Clubs or Organizations: Yes  . Attends Archivist Meetings: More than 4 times per year  . Marital Status: Never married  Intimate Partner Violence: Not At Risk  . Fear of Current or Ex-Partner: No  . Emotionally Abused: No  . Physically Abused: No  . Sexually Abused: No    FAMILY HISTORY:  Family History  Problem Relation Age of Onset  . Heart failure Mother 55  . Dementia Father   . Colon cancer Neg Hx     CURRENT MEDICATIONS:  Current Outpatient Medications  Medication Sig Dispense Refill  . acetaminophen (TYLENOL) 325 MG tablet Take 650 mg by mouth every 6 (six) hours as needed.    [name] acyclovir (ZOVIRAX) 200 MG capsule Take 1 capsule (200 mg total) by mouth 2 (two) times daily. 60 capsule 3  . alfuzosin (UROXATRAL) 10 MG 24 hr  tablet Take 1 tablet (10 mg total) by mouth daily with breakfast. 30 tablet 11  . allopurinol (ZYLOPRIM) 100 MG tablet Take 2 tablets (200 mg total) by mouth every morning. 60 tablet 0  . apixaban (ELIQUIS) 2.5 MG TABS tablet Take 1 tablet (2.5 mg total) by mouth daily. Start taking from tomorrow 10/06/2020    . B-D UF III MINI PEN NEEDLES 31G X 5 MM MISC   4  . cyclobenzaprine (FLEXERIL) 10 MG tablet TAKE 1 TABLET BY MOUTH THREE TIMES DAILY AS NEEDED FOR MUSCLE SPASM 30 tablet 0  . dexamethasone (DECADRON) 4 MG tablet Take 5 tablets (20 mg total) by mouth once a week. 20 tablet 3  . dicyclomine (BENTYL) 10 MG capsule Take 10 mg by mouth 2 (two) times daily as needed.     . diphenoxylate-atropine (LOMOTIL) 2.5-0.025 MG tablet Take 2 tablets by mouth 4 (four) times daily as needed.     . ergocalciferol (VITAMIN D2) 1.25 MG (50000 UT) capsule Take by mouth.    . furosemide (LASIX) 40 MG tablet Take 1.5 tablets (60 mg total) by mouth daily. 45 tablet 1  . gabapentin (NEURONTIN) 300 MG capsule Take 1 capsule (300 mg total) by mouth at bedtime.    [name] glipiZIDE (GLUCOTROL XL) 10 MG 24 hr tablet TAKE (1) TABLET BY MOUTH DAILY WITH BREAKFAST. 30 tablet 11  . lanthanum (FOSRENOL) 1000 MG chewable tablet     . lisinopril (ZESTRIL) 5 MG tablet Take 1 tablet by mouth daily.    . Multiple Vitamins-Minerals (CENTRUM SILVER 50+MEN) TABS Take 1 tablet by mouth every morning.    [name] NOVOLOG FLEXPEN 100 UNIT/ML FlexPen Inject 8 Units into the skin 3 (three) times daily with meals.     . Oxycodone HCl 10 MG TABS Take 1 tablet (10 mg total) by mouth every 12 (twelve) hours as needed. 60 tablet 0  . pravastatin (PRAVACHOL) 20 MG tablet Take 20 mg by mouth at bedtime.     . sodium bicarbonate 650 MG tablet Take by mouth.    . traZODone (DESYREL) 100 MG tablet TAKE ONE TABLET BY MOUTH AT BEDTIME. 30 tablet 0  . TRESIBA FLEXTOUCH 200 UNIT/ML FlexTouch Pen Inject 16 Units into the skin daily.    . TRUE METRIX BLOOD GLUCOSE  TEST test strip     . VELPHORO 500 MG chewable tablet Chew 1,000 mg by mouth 3 (three) times daily.  No current facility-administered medications for this visit.   Facility-Administered Medications Ordered in Other Visits  Medication Dose Route Frequency Provider Last Rate Last Admin  . 0.9 %  sodium chloride infusion  250 mL Intravenous Once Twana First, MD        ALLERGIES:  No Known Allergies  PHYSICAL EXAM:  Performance status (ECOG): 1 - Symptomatic but completely ambulatory  Vitals:   01/25/21 0929  BP: (!) 108/49  Pulse: 95  Resp: 20  Temp: (!) 97.3 F (36.3 C)  SpO2: 96%   Wt Readings from Last 3 Encounters:  01/25/21 218 lb 11.1 oz (99.2 kg)  01/23/21 222 lb (100.7 kg)  01/18/21 218 lb 4.1 oz (99 kg)   Physical Exam Vitals reviewed.  Constitutional:      Appearance: Normal appearance. He is obese.  Cardiovascular:     Rate and Rhythm: Normal rate and regular rhythm.     Pulses: Normal pulses.     Heart sounds: Normal heart sounds.  Pulmonary:     Effort: Pulmonary effort is normal.     Breath sounds: Normal breath sounds.  Chest:     Comments: Port-a-Cath in L chest Musculoskeletal:     Thoracic back: No tenderness or bony tenderness.     Lumbar back: Bony tenderness (lower lumbar/pelvis TTP) present.     Right lower leg: Edema (1+) present.     Left lower leg: Edema (1+) present.  Neurological:     General: No focal deficit present.     Mental Status: He is alert and oriented to person, place, and time.  Psychiatric:        Mood and Affect: Mood normal.        Behavior: Behavior normal.     LABORATORY DATA:  I have reviewed the labs as listed.  CBC Latest Ref Rng & Units 01/25/2021 01/18/2021 01/04/2021  WBC 4.0 - 10.5 K/uL 3.1(L) 3.4(L) 3.8(L)  Hemoglobin 13.0 - 17.0 g/dL 7.2(L) 7.1(L) 7.4(L)  Hematocrit 39.0 - 52.0 % 22.2(L) 21.7(L) 22.9(L)  Platelets 150 - 400 K/uL 36(L) 56(L) 37(L)   CMP Latest Ref Rng & Units 01/25/2021 01/18/2021 01/04/2021   Glucose 70 - 99 mg/dL 65(L) 72 149(H)  BUN 8 - 23 mg/dL 31(H) 19 31(H)  Creatinine 0.61 - 1.24 mg/dL 5.77(H) 6.16(H) 5.87(H)  Sodium 135 - 145 mmol/L 138 136 137  Potassium 3.5 - 5.1 mmol/L 3.7 3.6 3.7  Chloride 98 - 111 mmol/L 98 97(L) 98  CO2 22 - 32 mmol/L '28 26 25  ' Calcium 8.9 - 10.3 mg/dL 8.7(L) 8.5(L) 8.8(L)  Total Protein 6.5 - 8.1 g/dL 5.9(L) 6.4(L) 6.3(L)  Total Bilirubin 0.3 - 1.2 mg/dL 0.8 1.0 1.1  Alkaline Phos 38 - 126 U/L 83 91 91  AST 15 - 41 U/L 10(L) 11(L) 13(L)  ALT 0 - 44 U/L '14 13 18   ' Lab Results  Component Value Date   TOTALPROTELP 5.8 (L) 01/18/2021   ALBUMINELP 3.3 01/18/2021   A1GS 0.2 01/18/2021   A2GS 1.1 (H) 01/18/2021   BETS 0.8 01/18/2021   GAMS 0.4 01/18/2021   MSPIKE 0.2 (H) 01/18/2021   SPEI Comment 01/18/2021    Lab Results  Component Value Date   KPAFRELGTCHN Note: (A) 01/18/2021   LAMBDASER 10.0 01/18/2021   KAPLAMBRATIO 1,708.27 (H) 01/18/2021    DIAGNOSTIC IMAGING:  I have independently reviewed the scans and discussed with the patient. VAS [place] UPPER EXTREMITY ARTERIAL DUPLEX  Result Date: 01/19/2021 UPPER EXTREMITY DUPLEX STUDY Indications: Patient complains  of pre op HD access.  Performing Technologist: Ralene Cork RVT  Examination Guidelines: A complete evaluation includes B-mode imaging, spectral Doppler, color Doppler, and power Doppler as needed of all accessible portions of each vessel. Bilateral testing is considered an integral part of a complete examination. Limited examinations for reoccurring indications may be performed as noted.  Right Pre-Dialysis Findings: +-----------------------+----------+--------------------+---------+--------+ Location               PSV (cm/s)Intralum. Diam. (cm)Waveform Comments +-----------------------+----------+--------------------+---------+--------+ Brachial Antecub. fossa98        0.42                triphasic          +-----------------------+----------+--------------------+---------+--------+ Radial Art at Wrist    84        0.25                triphasic         +-----------------------+----------+--------------------+---------+--------+ Ulnar Art at Wrist     76        0.24                triphasic         +-----------------------+----------+--------------------+---------+--------+  Left Pre-Dialysis Findings: +-----------------------+----------+--------------------+---------+--------+ Location               PSV (cm/s)Intralum. Diam. (cm)Waveform Comments +-----------------------+----------+--------------------+---------+--------+ Brachial Antecub. fossa106       0.47                triphasic         +-----------------------+----------+--------------------+---------+--------+ Radial Art at Wrist    72        0.22                triphasic         +-----------------------+----------+--------------------+---------+--------+ Ulnar Art at Wrist     88        0.29                triphasic         +-----------------------+----------+--------------------+---------+--------+  Summary:  Right: Patent brachial, raidal, and ulnar artery. Left: Patent brachial, raidal, and ulnar artery. *See table(s) above for measurements and observations. Electronically signed by Ruta Hinds MD on 01/19/2021 at 4:05:20 PM.    Final    VAS [place] UPPER EXT VEIN MAPPING (PRE-OP AVF)  Result Date: 01/19/2021 UPPER EXTREMITY VEIN MAPPING  Indications: Pre-access. Performing Technologist: Ralene Cork RVT  Examination Guidelines: A complete evaluation includes B-mode imaging, spectral Doppler, color Doppler, and power Doppler as needed of all accessible portions of each vessel. Bilateral testing is considered an integral part of a complete examination. Limited examinations for reoccurring indications may be performed as noted. +-----------------+-------------+----------+---------------+ Right Cephalic   Diameter  (cm)Depth (cm)   Findings     +-----------------+-------------+----------+---------------+ Shoulder             0.35                               +-----------------+-------------+----------+---------------+ Prox upper arm       0.40                               +-----------------+-------------+----------+---------------+ Mid upper arm        0.38                               +-----------------+-------------+----------+---------------+  Dist upper arm       0.38                               +-----------------+-------------+----------+---------------+ Antecubital fossa    0.40                               +-----------------+-------------+----------+---------------+ Prox forearm         0.37                               +-----------------+-------------+----------+---------------+ Mid forearm          0.36                               +-----------------+-------------+----------+---------------+ Dist forearm         0.30               branch 0.265 cm +-----------------+-------------+----------+---------------+ +-----------------+-------------+----------+--------+ Right Basilic    Diameter (cm)Depth (cm)Findings +-----------------+-------------+----------+--------+ Prox upper arm       0.57                        +-----------------+-------------+----------+--------+ Mid upper arm        0.46                        +-----------------+-------------+----------+--------+ Dist upper arm       0.47                        +-----------------+-------------+----------+--------+ Antecubital fossa    0.46                        +-----------------+-------------+----------+--------+ +-----------------+-------------+----------+-----------------------------------+ Left Cephalic    Diameter (cm)Depth (cm)             Findings               +-----------------+-------------+----------+-----------------------------------+ Shoulder             0.33                                                    +-----------------+-------------+----------+-----------------------------------+ Prox upper arm       0.31                                                   +-----------------+-------------+----------+-----------------------------------+ Mid upper arm        0.38                                                   +-----------------+-------------+----------+-----------------------------------+ Dist upper arm       0.39                                                   +-----------------+-------------+----------+-----------------------------------+  Antecubital fossa    0.48                 long lateral branch goes aroung                                                back of forearm 0.283 cm       +-----------------+-------------+----------+-----------------------------------+ Prox forearm         0.26                         from basilic v            +-----------------+-------------+----------+-----------------------------------+ Mid forearm          0.31                                                   +-----------------+-------------+----------+-----------------------------------+ Dist forearm         0.31                                                   +-----------------+-------------+----------+-----------------------------------+ +-----------------+-------------+----------+-------------+ Left Basilic     Diameter (cm)Depth (cm)  Findings    +-----------------+-------------+----------+-------------+ Prox upper arm       0.56                             +-----------------+-------------+----------+-------------+ Mid upper arm        0.53                             +-----------------+-------------+----------+-------------+ Dist upper arm       0.54                             +-----------------+-------------+----------+-------------+ Antecubital fossa    0.33               to cephalic v  +-----------------+-------------+----------+-------------+ Summary: Right: Patent cephalic and basilic veins. Left: Patent cephalic and basilic veins. *See table(s) above for measurements and observations.  Diagnosing physician: Ruta Hinds MD Electronically signed by Ruta Hinds MD on 01/19/2021 at 4:05:32 PM.    Final      ASSESSMENT:  1. IgG kappa multiple myeloma: -4 cycles of RVD from 08/30/2017 through 02/18/2018. -Declined bone marrow transplant. Received maintenance Revlimid 2.5 mg 3 weeks on 1 week off. -Bone marrow biopsy on 02/23/2020 shows 21% plasma cells. Occasional sections had up to 75% myeloma cells. -Chromosome analysis was normal. FISH panel was positive for gain of 1 q., monosomy 13, del 17 P, t(14;20) -PET scan showed new left frontal destructive lesion. Numerous areas of lytic changes in the spine. Lucent areas in T6 with loss of height at T5. Numerous lytic changes throughout the spine noted along with pedicle and lamina and transverse process of T8 vertebral body. Profound hypermetabolic activity within the ribs bilaterally. -Daratumumab, pomalidomide and dexamethasone from 04/11/2020 through 09/05/2020 with progression. -Velcade, selinexor and dexamethasone from 10/01/2020  through 11/09/2020 with progression. -Bone marrow biopsy on 11/18/2020 at [facility] showed 95% plasma cells. -KCyDstarted on 11/23/2020. -2D echo on 11/28/2020 with EF 65 to 70%.  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 -Completed XRT on 04/05/2020. -MRI thoracic spine on 07/26/2020 showed significant overall improvement in multiple myeloma with much of the bone marrow has returned to normal fatty marrow. Ventral epidural tumor on the right of T5 near  completely resolved. Proximal right T7 rib lesion also resolved. -MRI of the lumbosacral spine from 11/30/2020. There is myelomatous involvement of the visualized thoracic, lumbar and sacral bones. There is no extradural mass. There is severe spinal canal stenosis at L3-L4.  3. 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 He is on Eliquis.   PLAN:  1. IgG kappa multiple myeloma: -He is tolerating carfilzomib, cyclophosphamide and dexamethasone regimen very well. -Reviewed myeloma panel from 01/18/2021.  M spike improved to 0.2 g from 0.3 g prior to start. -Free light chain ratio improved to 1703 from 2207 on 11/02/2020. -Reviewed his labs today which showed white count 3.1 with normal ANC.  Platelet count is 36.  Hemoglobin 7.2.  He will receive 1 unit of PRBC tomorrow. -He will proceed with his treatment today.  He does not have any chemotherapy related side effects. -Repeat myeloma labs in 3 weeks.  RTC 4 weeks for follow-up.  2.ESRD on HD: -Continue hemodialysis on Tuesday, Friday and Saturday.  3. Left leg DVT: -We are holding Eliquis due to low platelet count.  4.Diabetes: -Continue Tresiba and NovoLog.  5. Insomnia: -Continue trazodone 100 mg at bedtime.  6. Low back pain: -He reports the low back pain is better lately.  He is using oxycodone 10 mg twice daily as needed.   Orders placed this encounter:  Orders Placed This Encounter  Procedures  . CBC with Differential/Platelet  . Comprehensive metabolic panel  . Kappa/lambda light chains  . Protein electrophoresis, serum     Derek Jack, MD [place] [phone]   I, Milinda Antis, am acting as a scribe for Dr. Sanda Linger.  I, Derek Jack MD, have reviewed the above documentation for accuracy and completeness, and I agree with the above.

## 2021-01-25 NOTE — Patient Instructions (Addendum)
[place] at [facility] Discharge Instructions  You were seen today by Dr. Delton Coombes. He went over your recent results. You received your treatment today, the carfilzomib and cyclophosphamide, today; continue getting your treatment every week. You will be scheduled to receive 1 unit of blood. Dr. Delton Coombes will see you back in 4 weeks for labs and follow up.   Thank you for choosing [place] at [facility] to provide your oncology and hematology care.  To afford each patient quality time with our provider, please arrive at least 15 minutes before your scheduled appointment time.   If you have a lab appointment with the [facility] please come in thru the Main Entrance and check in at the main information desk  You need to re-schedule your appointment should you arrive 10 or more minutes late.  We strive to give you quality time with our providers, and arriving late affects you and other patients whose appointments are after yours.  Also, if you no show three or more times for appointments you may be dismissed from the clinic at the providers discretion.     Again, thank you for choosing [facility].  Our hope is that these requests will decrease the amount of time that you wait before being seen by our physicians.       _____________________________________________________________  Should you have questions after your visit to [facility], please contact our office at [phone] between the hours of 8:00 a.m. and 4:30 p.m.  Voicemails left after 4:00 p.m. will not be returned until the following business day.  For prescription refill requests, have your pharmacy contact our office and allow 72 hours.    Cancer Center Support Programs:   > Cancer Support Group  2nd Tuesday of the month 1pm-2pm, Journey Room

## 2021-01-26 ENCOUNTER — Inpatient Hospital Stay (HOSPITAL_COMMUNITY): Payer: Medicare Other

## 2021-01-26 DIAGNOSIS — Z5111 Encounter for antineoplastic chemotherapy: Secondary | ICD-10-CM | POA: Diagnosis not present

## 2021-01-26 DIAGNOSIS — Z5112 Encounter for antineoplastic immunotherapy: Secondary | ICD-10-CM | POA: Diagnosis not present

## 2021-01-26 DIAGNOSIS — C9000 Multiple myeloma not having achieved remission: Secondary | ICD-10-CM

## 2021-01-26 MED ORDER — SODIUM CHLORIDE 0.9% FLUSH
10.0000 mL | INTRAVENOUS | Status: AC | PRN
  Administered 2021-01-26: 10 mL

## 2021-01-26 MED ORDER — HEPARIN SOD (PORK) LOCK FLUSH 100 UNIT/ML IV SOLN
500.0000 [IU] | Freq: Every day | INTRAVENOUS | Status: AC | PRN
  Administered 2021-01-26: 500 [IU]

## 2021-01-26 MED ORDER — ACETAMINOPHEN 325 MG PO TABS
650.0000 mg | ORAL_TABLET | Freq: Once | ORAL | Status: AC
  Administered 2021-01-26: 650 mg via ORAL

## 2021-01-26 MED ORDER — ACETAMINOPHEN 325 MG PO TABS
ORAL_TABLET | ORAL | Status: AC
  Filled 2021-01-26: qty 2

## 2021-01-26 MED ORDER — SODIUM CHLORIDE 0.9% IV SOLUTION
250.0000 mL | Freq: Once | INTRAVENOUS | Status: AC
  Administered 2021-01-26: 250 mL via INTRAVENOUS

## 2021-01-26 MED ORDER — DIPHENHYDRAMINE HCL 25 MG PO CAPS
25.0000 mg | ORAL_CAPSULE | Freq: Once | ORAL | Status: AC
  Administered 2021-01-26: 25 mg via ORAL

## 2021-01-26 MED ORDER — DIPHENHYDRAMINE HCL 25 MG PO CAPS
ORAL_CAPSULE | ORAL | Status: AC
  Filled 2021-01-26: qty 1

## 2021-01-26 NOTE — Progress Notes (Signed)
Tom Marshall presents today for transfusion of one unit PRBC for hemoglobin of 7.2 yesterday. Pt denies any new symptoms or changes overnight after receiving tx yesterday. Premedication given as ordered.  Transfusion tolerated without incident or complaint. VSS prior to, during, and post transfusion. Port flushed and deaccessed per protocol, see MAR and IV flowsheet for details. Discharged in satisfactory condition by staff member in satisfactory condition, follow up instructions in hand.

## 2021-01-26 NOTE — Patient Instructions (Signed)
[facility] at [facility]  Discharge Instructions:   Blood Transfusion, Adult, Care After This sheet gives you information about how to care for yourself after your procedure. Your doctor may also give you more specific instructions. If you have problems or questions, contact your doctor. What can I expect after the procedure? After the procedure, it is common to have:  Bruising and soreness at the IV site.  A fever or chills on the day of the procedure. This may be your body's response to the new blood cells received.  A headache. Follow these instructions at home: Insertion site care  Follow instructions from your doctor about how to take care of your insertion site. This is where an IV tube was put into your vein. Make sure you: ? Wash your hands with soap and water before and after you change your bandage (dressing). If you cannot use soap and water, use hand sanitizer. ? Change your bandage as told by your doctor.  Check your insertion site every day for signs of infection. Check for: ? Redness, swelling, or pain. ? Bleeding from the site. ? Warmth. ? Pus or a bad smell.      General instructions  Take over-the-counter and prescription medicines only as told by your doctor.  Rest as told by your doctor.  Go back to your normal activities as told by your doctor.  Keep all follow-up visits as told by your doctor. This is important. Contact a doctor if:  You have itching or red, swollen areas of skin (hives).  You feel worried or nervous (anxious).  You feel weak after doing your normal activities.  You have redness, swelling, warmth, or pain around the insertion site.  You have blood coming from the insertion site, and the blood does not stop with pressure.  You have pus or a bad smell coming from the insertion site. Get help right away if:  You have signs of a serious reaction. This may be coming from an allergy or the body's defense  system (immune system). Signs include: ? Trouble breathing or shortness of breath. ? Swelling of the face or feeling warm (flushed). ? Fever or chills. ? Head, chest, or back pain. ? Dark pee (urine) or blood in the pee. ? Widespread rash. ? Fast heartbeat. ? Feeling dizzy or light-headed. You may receive your blood transfusion in an outpatient setting. If so, you will be told whom to contact to report any reactions. These symptoms may be an emergency. Do not wait to see if the symptoms will go away. Get medical help right away. Call your local emergency services (911 in the [place] Do not drive yourself to the hospital. Summary  Bruising and soreness at the IV site are common.  Check your insertion site every day for signs of infection.  Rest as told by your doctor. Go back to your normal activities as told by your doctor.  Get help right away if you have signs of a serious reaction. This information is not intended to replace advice given to you by your health care provider. Make sure you discuss any questions you have with your health care provider. Document Revised: 04/09/2019 Document Reviewed: 04/09/2019 Elsevier Patient Education  2021 [employer]  _______________________________________________________________  Thank you for choosing [facility] at [facility] to provide your oncology and hematology care.  To afford each patient quality time with our providers, please arrive at least 15 minutes before your scheduled appointment.    You need to re-schedule your appointment if you arrive 10 or more minutes late.  We strive to give you quality time with our providers, and arriving late affects you and other patients whose appointments are after yours.  Also, if you no show three or more times for appointments you may be dismissed from the clinic.  Again, thank you for choosing [facility] at [facility]. Our hope is that these requests will  allow you access to exceptional care and in a timely manner. _______________________________________________________________  If you have questions after your visit, please contact our office at [phone] between the hours of 8:30 a.m. and 5:00 p.m. Voicemails left after 4:30 p.m. will not be returned until the following business day. _______________________________________________________________  For prescription refill requests, have your pharmacy contact our office. _______________________________________________________________  Recommendations made by the consultant and any test results will be sent to your referring physician. _______________________________________________________________ 

## 2021-01-27 DIAGNOSIS — D631 Anemia in chronic kidney disease: Secondary | ICD-10-CM | POA: Diagnosis not present

## 2021-01-27 DIAGNOSIS — Z992 Dependence on renal dialysis: Secondary | ICD-10-CM | POA: Diagnosis not present

## 2021-01-27 DIAGNOSIS — N2581 Secondary hyperparathyroidism of renal origin: Secondary | ICD-10-CM | POA: Diagnosis not present

## 2021-01-27 DIAGNOSIS — N186 End stage renal disease: Secondary | ICD-10-CM | POA: Diagnosis not present

## 2021-01-27 LAB — TYPE AND SCREEN
ABO/RH(D): A POS
Antibody Screen: NEGATIVE
Unit division: 0

## 2021-01-27 LAB — BPAM RBC
Blood Product Expiration Date: 202204082359
ISSUE DATE / TIME: 202203310942
Unit Type and Rh: 5100

## 2021-01-31 DIAGNOSIS — D631 Anemia in chronic kidney disease: Secondary | ICD-10-CM | POA: Diagnosis not present

## 2021-01-31 DIAGNOSIS — N186 End stage renal disease: Secondary | ICD-10-CM | POA: Diagnosis not present

## 2021-01-31 DIAGNOSIS — N2581 Secondary hyperparathyroidism of renal origin: Secondary | ICD-10-CM | POA: Diagnosis not present

## 2021-01-31 DIAGNOSIS — Z992 Dependence on renal dialysis: Secondary | ICD-10-CM | POA: Diagnosis not present

## 2021-02-01 ENCOUNTER — Inpatient Hospital Stay (HOSPITAL_COMMUNITY): Payer: Medicare Other

## 2021-02-01 ENCOUNTER — Other Ambulatory Visit (HOSPITAL_COMMUNITY): Payer: Self-pay | Admitting: Hematology

## 2021-02-01 ENCOUNTER — Other Ambulatory Visit: Payer: Self-pay

## 2021-02-01 ENCOUNTER — Inpatient Hospital Stay (HOSPITAL_COMMUNITY): Payer: Medicare Other | Attending: Hematology

## 2021-02-01 VITALS — BP 106/57 | HR 83 | Temp 98.6°F | Resp 16

## 2021-02-01 DIAGNOSIS — Z5112 Encounter for antineoplastic immunotherapy: Secondary | ICD-10-CM | POA: Diagnosis not present

## 2021-02-01 DIAGNOSIS — C9000 Multiple myeloma not having achieved remission: Secondary | ICD-10-CM | POA: Diagnosis not present

## 2021-02-01 DIAGNOSIS — Z5111 Encounter for antineoplastic chemotherapy: Secondary | ICD-10-CM | POA: Insufficient documentation

## 2021-02-01 LAB — COMPREHENSIVE METABOLIC PANEL
ALT: 16 U/L (ref 0–44)
AST: 10 U/L — ABNORMAL LOW (ref 15–41)
Albumin: 3.5 g/dL (ref 3.5–5.0)
Alkaline Phosphatase: 80 U/L (ref 38–126)
Anion gap: 13 (ref 5–15)
BUN: 31 mg/dL — ABNORMAL HIGH (ref 8–23)
CO2: 26 mmol/L (ref 22–32)
Calcium: 8.4 mg/dL — ABNORMAL LOW (ref 8.9–10.3)
Chloride: 99 mmol/L (ref 98–111)
Creatinine, Ser: 6.06 mg/dL — ABNORMAL HIGH (ref 0.61–1.24)
GFR, Estimated: 9 mL/min — ABNORMAL LOW (ref >60)
Glucose, Bld: 71 mg/dL (ref 70–99)
Potassium: 3.9 mmol/L (ref 3.5–5.1)
Sodium: 138 mmol/L (ref 135–145)
Total Bilirubin: 1 mg/dL (ref 0.3–1.2)
Total Protein: 5.9 g/dL — ABNORMAL LOW (ref 6.5–8.1)

## 2021-02-01 LAB — CBC WITH DIFFERENTIAL/PLATELET
Abs Immature Granulocytes: 0.02 10*3/uL (ref 0.00–0.07)
Basophils Absolute: 0 10*3/uL (ref 0.0–0.1)
Basophils Relative: 0 %
Eosinophils Absolute: 0.1 10*3/uL (ref 0.0–0.5)
Eosinophils Relative: 2 %
HCT: 22.5 % — ABNORMAL LOW (ref 39.0–52.0)
Hemoglobin: 7.3 g/dL — ABNORMAL LOW (ref 13.0–17.0)
Immature Granulocytes: 1 %
Lymphocytes Relative: 13 %
Lymphs Abs: 0.4 10*3/uL — ABNORMAL LOW (ref 0.7–4.0)
MCH: 31.1 pg (ref 26.0–34.0)
MCHC: 32.4 g/dL (ref 30.0–36.0)
MCV: 95.7 fL (ref 80.0–100.0)
Monocytes Absolute: 0.4 10*3/uL (ref 0.1–1.0)
Monocytes Relative: 12 %
Neutro Abs: 2.5 10*3/uL (ref 1.7–7.7)
Neutrophils Relative %: 72 %
Platelets: 35 10*3/uL — ABNORMAL LOW (ref 150–400)
RBC: 2.35 MIL/uL — ABNORMAL LOW (ref 4.22–5.81)
RDW: 23 % — ABNORMAL HIGH (ref 11.5–15.5)
WBC: 3.4 10*3/uL — ABNORMAL LOW (ref 4.0–10.5)
nRBC: 0 % (ref 0.0–0.2)

## 2021-02-01 LAB — SAMPLE TO BLOOD BANK

## 2021-02-01 MED ORDER — SODIUM CHLORIDE 0.9 % IV SOLN
Freq: Once | INTRAVENOUS | Status: AC
Start: 2021-02-01 — End: 2021-02-01

## 2021-02-01 MED ORDER — SODIUM CHLORIDE 0.9 % IV SOLN
300.0000 mg/m2 | Freq: Once | INTRAVENOUS | Status: AC
  Administered 2021-02-01: 660 mg via INTRAVENOUS
  Filled 2021-02-01: qty 33

## 2021-02-01 MED ORDER — SODIUM CHLORIDE 0.9% FLUSH: 10.0000 mL | INTRAVENOUS | Status: DC | PRN

## 2021-02-01 MED ORDER — SODIUM CHLORIDE 0.9 % IV SOLN: Freq: Once | INTRAVENOUS | Status: AC

## 2021-02-01 MED ORDER — ONDANSETRON HCL 40 MG/20ML IJ SOLN
Freq: Once | INTRAMUSCULAR | Status: AC
  Administered 2021-02-01: 8 mg via INTRAVENOUS
  Filled 2021-02-01: qty 4

## 2021-02-01 MED ORDER — SODIUM CHLORIDE 0.9 % IV SOLN
40.0000 mg | Freq: Once | INTRAVENOUS | Status: AC
  Administered 2021-02-01: 40 mg via INTRAVENOUS
  Filled 2021-02-01: qty 4

## 2021-02-01 MED ORDER — DEXTROSE 5 % IV SOLN
56.0000 mg/m2 | Freq: Once | INTRAVENOUS | Status: AC
  Administered 2021-02-01: 120 mg via INTRAVENOUS
  Filled 2021-02-01: qty 60

## 2021-02-01 MED ORDER — HEPARIN SOD (PORK) LOCK FLUSH 100 UNIT/ML IV SOLN
500.0000 [IU] | Freq: Once | INTRAVENOUS | Status: AC | PRN
  Administered 2021-02-01: 500 [IU]

## 2021-02-01 NOTE — Patient Instructions (Signed)
[facility] Discharge Instructions for Patients Receiving Chemotherapy  Today you received the following chemotherapy agents   To help prevent nausea and vomiting after your treatment, we encourage you to take your nausea medication   If you develop nausea and vomiting that is not controlled by your nausea medication, call the clinic.   BELOW ARE SYMPTOMS THAT SHOULD BE REPORTED IMMEDIATELY:  *FEVER GREATER THAN 100.5 F  *CHILLS WITH OR WITHOUT FEVER  NAUSEA AND VOMITING THAT IS NOT CONTROLLED WITH YOUR NAUSEA MEDICATION  *UNUSUAL SHORTNESS OF BREATH  *UNUSUAL BRUISING OR BLEEDING  TENDERNESS IN MOUTH AND THROAT WITH OR WITHOUT PRESENCE OF ULCERS  *URINARY PROBLEMS  *BOWEL PROBLEMS  UNUSUAL RASH Items with * indicate a potential emergency and should be followed up as soon as possible.  Feel free to call the clinic should you have any questions or concerns. The clinic phone number is [phone].  Please show the CHEMO ALERT CARD at check-in to the Emergency Department and triage nurse.   

## 2021-02-01 NOTE — Progress Notes (Signed)
Labs reviewed with MD today. Will proceed with treatment today and will recheck labs next Wednesday and possible blood for Thursday. No changes reported by patient today. Will proceed per MD.  Treatment given per orders. Patient tolerated it well without problems. Vitals stable and discharged home from clinic via wheelchair. Follow up as scheduled.

## 2021-02-02 ENCOUNTER — Other Ambulatory Visit: Payer: Self-pay

## 2021-02-05 DIAGNOSIS — R404 Transient alteration of awareness: Secondary | ICD-10-CM | POA: Diagnosis not present

## 2021-02-06 NOTE — Patient Instructions (Signed)
Tom Marshall  02/06/2021     @PREFPERIOPPHARMACY @   Your procedure is scheduled on  02/16/2021.   Report to [name] at  0800  A.M.   Call this number if you have problems the morning of surgery:  [phone]   Remember:  Do not eat or drink after midnight.                        Take these medicines the morning of surgery with A SIP OF WATER uroxatrol, allopurinol, flexeril, bentyl, gabapentin, oxycodone (If needed).  Take 1/2 of your tresiba dose ( 8 units) the night before your procedure.  DO NOT take any medications for diabetes the morning of your procedure.  If your glucose is 70 or below the morning of your procedure,drink 1/2 cup of clear juice and recheck your glucose in 15 minutes. If your glucose is still 70 or below, call [phone] for instructions.  If your glucose is 300 or above the morning of your procedure, cal  [id number] for instructions.      Do not wear jewelry, make-up or nail polish.  Do not wear lotions, powders, or perfumes, or deodorant.  Do not shave 48 hours prior to surgery.  Men may shave face and neck.  Do not bring valuables to the hospital.  [facility] is not responsible for any belongings or valuables.  Contacts, dentures or bridgework may not be worn into surgery.  Leave your suitcase in the car.  After surgery it may be brought to your room.  For patients admitted to the hospital, discharge time will be determined by your treatment team.  Patients discharged the day of surgery will not be allowed to drive home and must have someone with them for 24 hours.  Place clean sheets on your bed the night before your procedure and DO NOT sleep with  Pets this night.  Shower with CHG the night before and the morning of your procedure. DO NOT put CHG on your face, hair or genitals.  After each shower, dry off with  Clean towel, put on clean, comfortable clothes and brush your teeth.   Special instructions:  DO NOT smoke  tobacco or vape for 24 hours before your procedure.   Please read over the following fact sheets that you were given. Coughing and Deep Breathing, Surgical Site Infection Prevention, Anesthesia Post-op Instructions and Care and Recovery After Surgery       AV Fistula Placement, Care After The following information offers guidance on how to care for yourself after your procedure. Your health care provider may also give you more specific instructions. If you have problems or questions, contact your health care provider. What can I expect after the procedure? After the procedure, it is common to have:  Soreness at the fistula site.  Vibration (thrill) over the fistula. Follow these instructions at home: Medicines  Take over-the-counter and prescription medicines only as told by your health care provider.  Ask your health care provider if the medicine prescribed to you can cause constipation. You may need to take these actions to prevent or treat constipation: ? Drink enough fluid to keep your urine pale yellow. ? Take over-the-counter or prescription medicines. ? Eat foods that are high in fiber, such as beans, whole grains, and fresh fruits and vegetables. ? Limit foods that are high in fat and processed sugars, such as fried or sweet foods. Incision care Follow instructions  from your health care provider about how to take care of your incision. Make sure you:  Wash your hands with soap and water for at least 20 seconds before and after you change your bandage (dressing). If soap and water are not available, use hand sanitizer.  Change your dressing as told by your health care provider.  Leave stitches (sutures), skin glue, or adhesive strips in place. These skin closures may need to stay in place for 2 weeks or longer. If adhesive strip edges start to loosen and curl up, you may trim the loose edges. Do not remove adhesive strips completely unless your health care provider tells you  to do that.   Fistula care  Check your fistula site every day to make sure the thrill feels the same.  Check your fistula site every day for signs of infection. Check for: ? More redness, swelling, or pain. ? Fluid or blood. ? Warmth. ? Pus or a bad smell.  Raise (elevate) the affected area above the level of your heart while you are sitting or lying down.  Do not lift anything that is heavier than 10 lb (4.5 kg), or the limit that you are told, until your health care provider says that it is safe.  Do not lie down on your fistula arm.  Do not let anyone draw blood or take a blood pressure reading on your fistula arm. This is important.  Do not wear tight jewelry or clothing over your fistula arm.   Bathing  Do not take baths, swim, or use a hot tub until your health care provider approves. Ask your health care provider if you may take showers. You may only be allowed to take sponge baths.  Keep the area around your incision clean and dry. General instructions  Rest at home for a day or two.  If you were given a sedative during the procedure, it can affect you for several hours. Do not drive or operate machinery until your health care provider says that it is safe.  Return to your normal activities as told. Ask your health care provider what activities are safe for you.  Keep all follow-up visits. This is important. Contact a health care provider if:  You have more redness, swelling, or pain around your fistula site.  Your fistula site feels warm to the touch.  You have pus or a bad smell coming from your fistula site.  You have a fever or chills.  You feel numb or cold in your arm or your fistula site.  You feel a decrease or a change in the thrill over the fistula. Get help right away if:  You have bleeding from your fistula site that will not stop.  You have chest pain.  You have trouble breathing. These symptoms may represent a serious problem that is an  emergency. Do not wait to see if the symptoms will go away. Get medical help right away. Call your local emergency services (911 in the [place] Do not drive yourself to the hospital. Summary  Follow instructions from your health care provider about how to take care of your incision.  Do not let anyone draw blood or take a blood pressure reading on your fistula arm. This is important.  Contact a health care provider if you have a change in the thrill or have any signs of infection at your fistula site.  Keep all follow-up visits. This is important. This information is not intended to replace advice given to  you by your health care provider. Make sure you discuss any questions you have with your health care provider. Document Revised: 05/25/2020 Document Reviewed: 05/25/2020 Elsevier Patient Education  2021 [place] After This sheet gives you information about how to care for yourself after your procedure. Your health care provider may also give you more specific instructions. If you have problems or questions, contact your health care provider. What can I expect after the procedure? After the procedure, it is common to have:  Tiredness.  Forgetfulness about what happened after the procedure.  Impaired judgment for important decisions.  Nausea or vomiting.  Some difficulty with balance. Follow these instructions at home: For the time period you were told by your health care provider:  Rest as needed.  Do not participate in activities where you could fall or become injured.  Do not drive or use machinery.  Do not drink alcohol.  Do not take sleeping pills or medicines that cause drowsiness.  Do not make important decisions or sign legal documents.  Do not take care of children on your own.      Eating and drinking  Follow the diet that is recommended by your health care provider.  Drink enough fluid to keep your urine pale  yellow.  If you vomit: ? Drink water, juice, or soup when you can drink without vomiting. ? Make sure you have little or no nausea before eating solid foods. General instructions  Have a responsible adult stay with you for the time you are told. It is important to have someone help care for you until you are awake and alert.  Take over-the-counter and prescription medicines only as told by your health care provider.  If you have sleep apnea, surgery and certain medicines can increase your risk for breathing problems. Follow instructions from your health care provider about wearing your sleep device: ? Anytime you are sleeping, including during daytime naps. ? While taking prescription pain medicines, sleeping medicines, or medicines that make you drowsy.  Avoid smoking.  Keep all follow-up visits as told by your health care provider. This is important. Contact a health care provider if:  You keep feeling nauseous or you keep vomiting.  You feel light-headed.  You are still sleepy or having trouble with balance after 24 hours.  You develop a rash.  You have a fever.  You have redness or swelling around the IV site. Get help right away if:  You have trouble breathing.  You have new-onset confusion at home. Summary  For several hours after your procedure, you may feel tired. You may also be forgetful and have poor judgment.  Have a responsible adult stay with you for the time you are told. It is important to have someone help care for you until you are awake and alert.  Rest as told. Do not drive or operate machinery. Do not drink alcohol or take sleeping pills.  Get help right away if you have trouble breathing, or if you suddenly become confused. This information is not intended to replace advice given to you by your health care provider. Make sure you discuss any questions you have with your health care provider. Document Revised: 06/30/2020 Document Reviewed:  09/17/2019 Elsevier Patient Education  2021 [employer].

## 2021-02-08 ENCOUNTER — Other Ambulatory Visit (HOSPITAL_COMMUNITY): Payer: Medicare Other

## 2021-02-09 ENCOUNTER — Encounter (HOSPITAL_COMMUNITY): Payer: Medicare Other

## 2021-02-13 ENCOUNTER — Encounter (HOSPITAL_COMMUNITY): Payer: Self-pay

## 2021-02-13 ENCOUNTER — Encounter (HOSPITAL_COMMUNITY): Admission: RE | Admit: 2021-02-13 | Payer: Medicare Other | Source: Ambulatory Visit

## 2021-02-15 ENCOUNTER — Ambulatory Visit (HOSPITAL_COMMUNITY): Payer: Medicare Other

## 2021-02-15 ENCOUNTER — Other Ambulatory Visit (HOSPITAL_COMMUNITY): Payer: Medicare Other

## 2021-02-15 ENCOUNTER — Ambulatory Visit (HOSPITAL_COMMUNITY): Payer: Medicare Other | Admitting: Hematology

## 2021-02-16 ENCOUNTER — Ambulatory Visit: Admit: 2021-02-16 | Payer: Medicare Other | Admitting: Vascular Surgery

## 2021-02-16 SURGERY — ARTERIOVENOUS (AV) FISTULA CREATION
Anesthesia: Choice | Laterality: Left

## 2021-02-22 ENCOUNTER — Other Ambulatory Visit (HOSPITAL_COMMUNITY): Payer: Medicare Other

## 2021-02-22 ENCOUNTER — Ambulatory Visit (HOSPITAL_COMMUNITY): Payer: Medicare Other

## 2021-02-26 DIAGNOSIS — 419620001 Death: Secondary | SNOMED CT | POA: Diagnosis not present

## 2021-02-26 DEATH — deceased

## 2021-03-01 ENCOUNTER — Other Ambulatory Visit (HOSPITAL_COMMUNITY): Payer: Medicare Other

## 2021-03-01 ENCOUNTER — Ambulatory Visit (HOSPITAL_COMMUNITY): Payer: Medicare Other

## 2021-04-04 ENCOUNTER — Ambulatory Visit: Payer: Medicare Other | Admitting: Urology

## 2021-04-11 ENCOUNTER — Ambulatory Visit: Payer: Medicare Other | Admitting: Urology

## 2021-11-14 IMAGING — DX DG CHEST 2V
2 series · 2 of 2 positions shown · non-contrast
Comparison: 04/07/2020

CLINICAL DATA: Shortness of breath

EXAM:
CHEST - 2 VIEW

[chest pa]
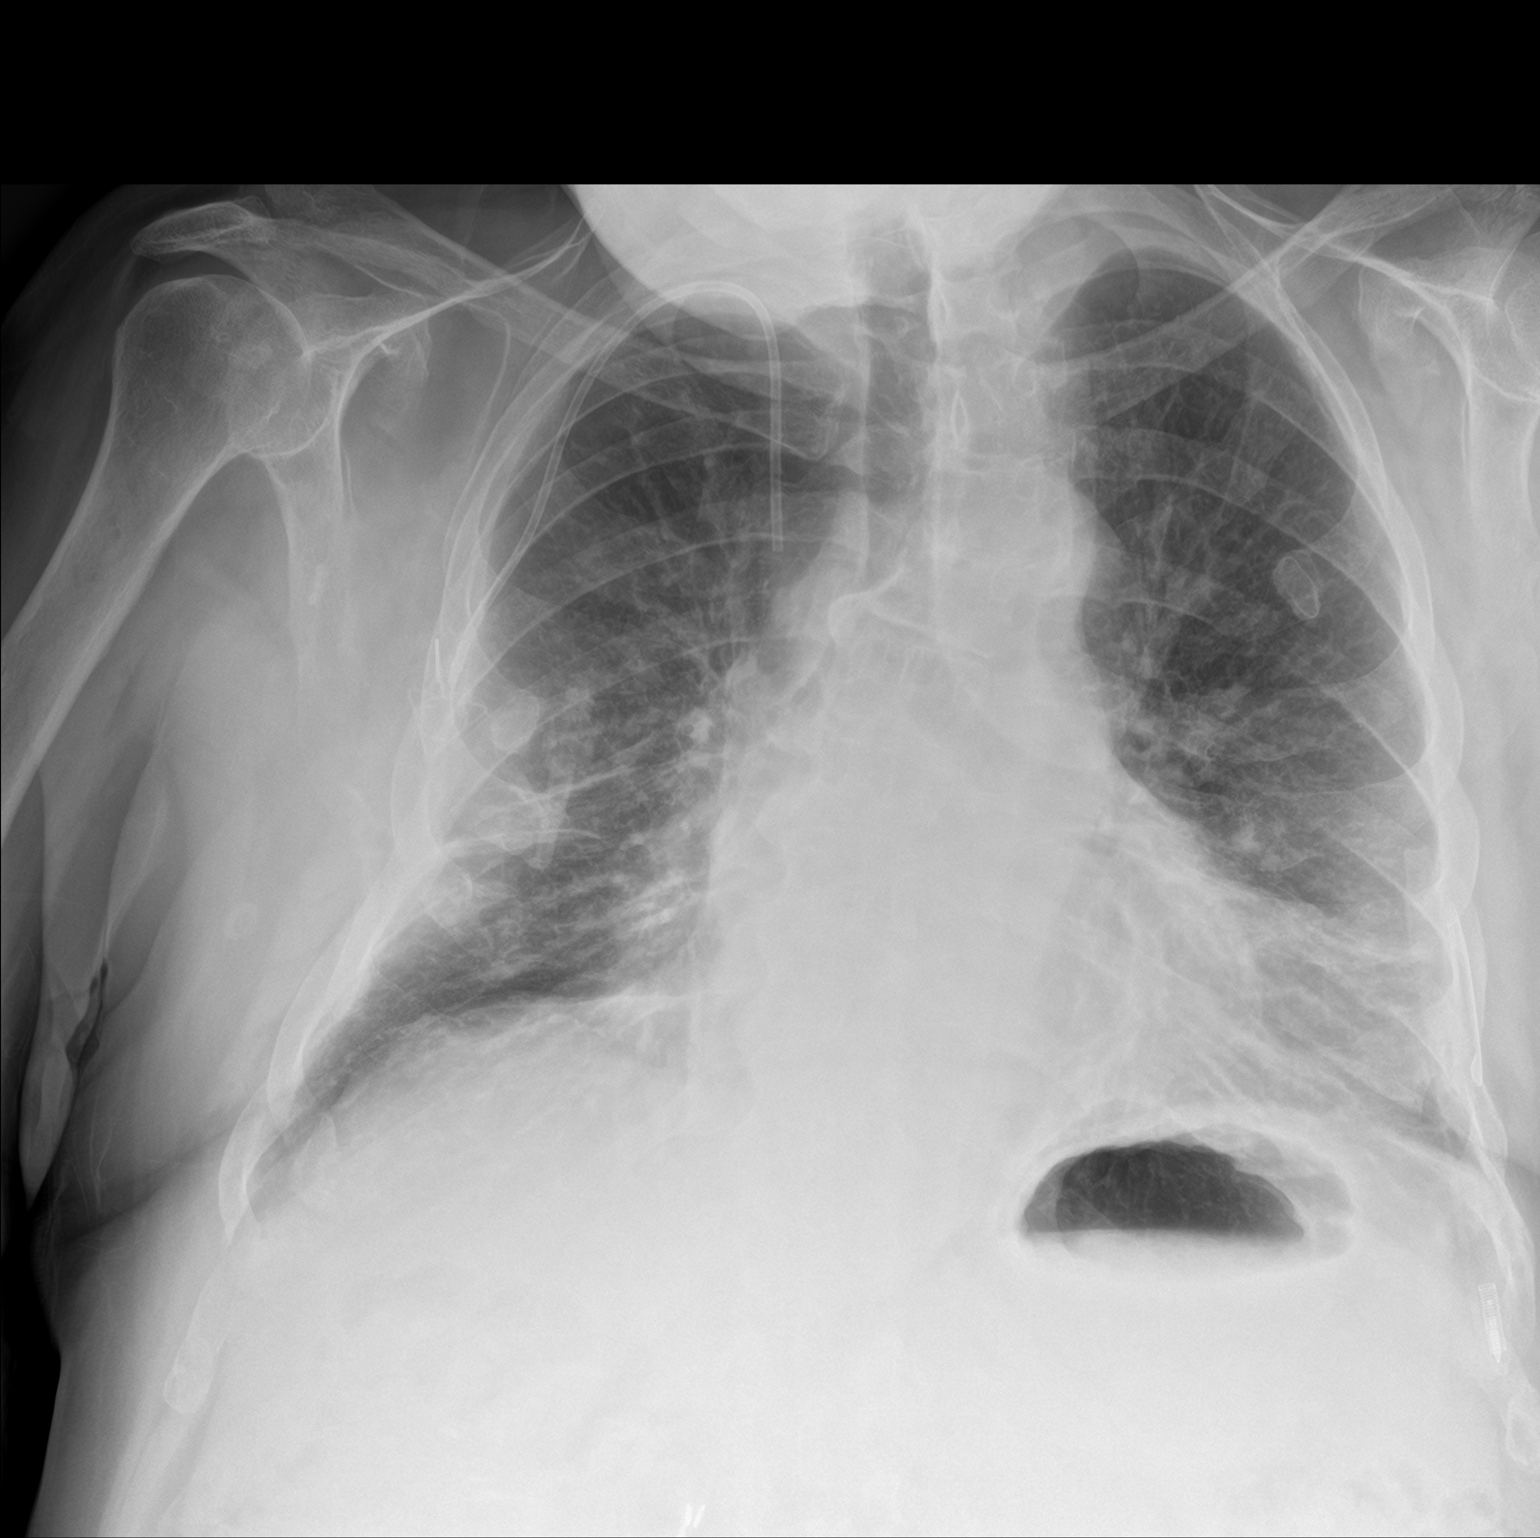

[chest lat]
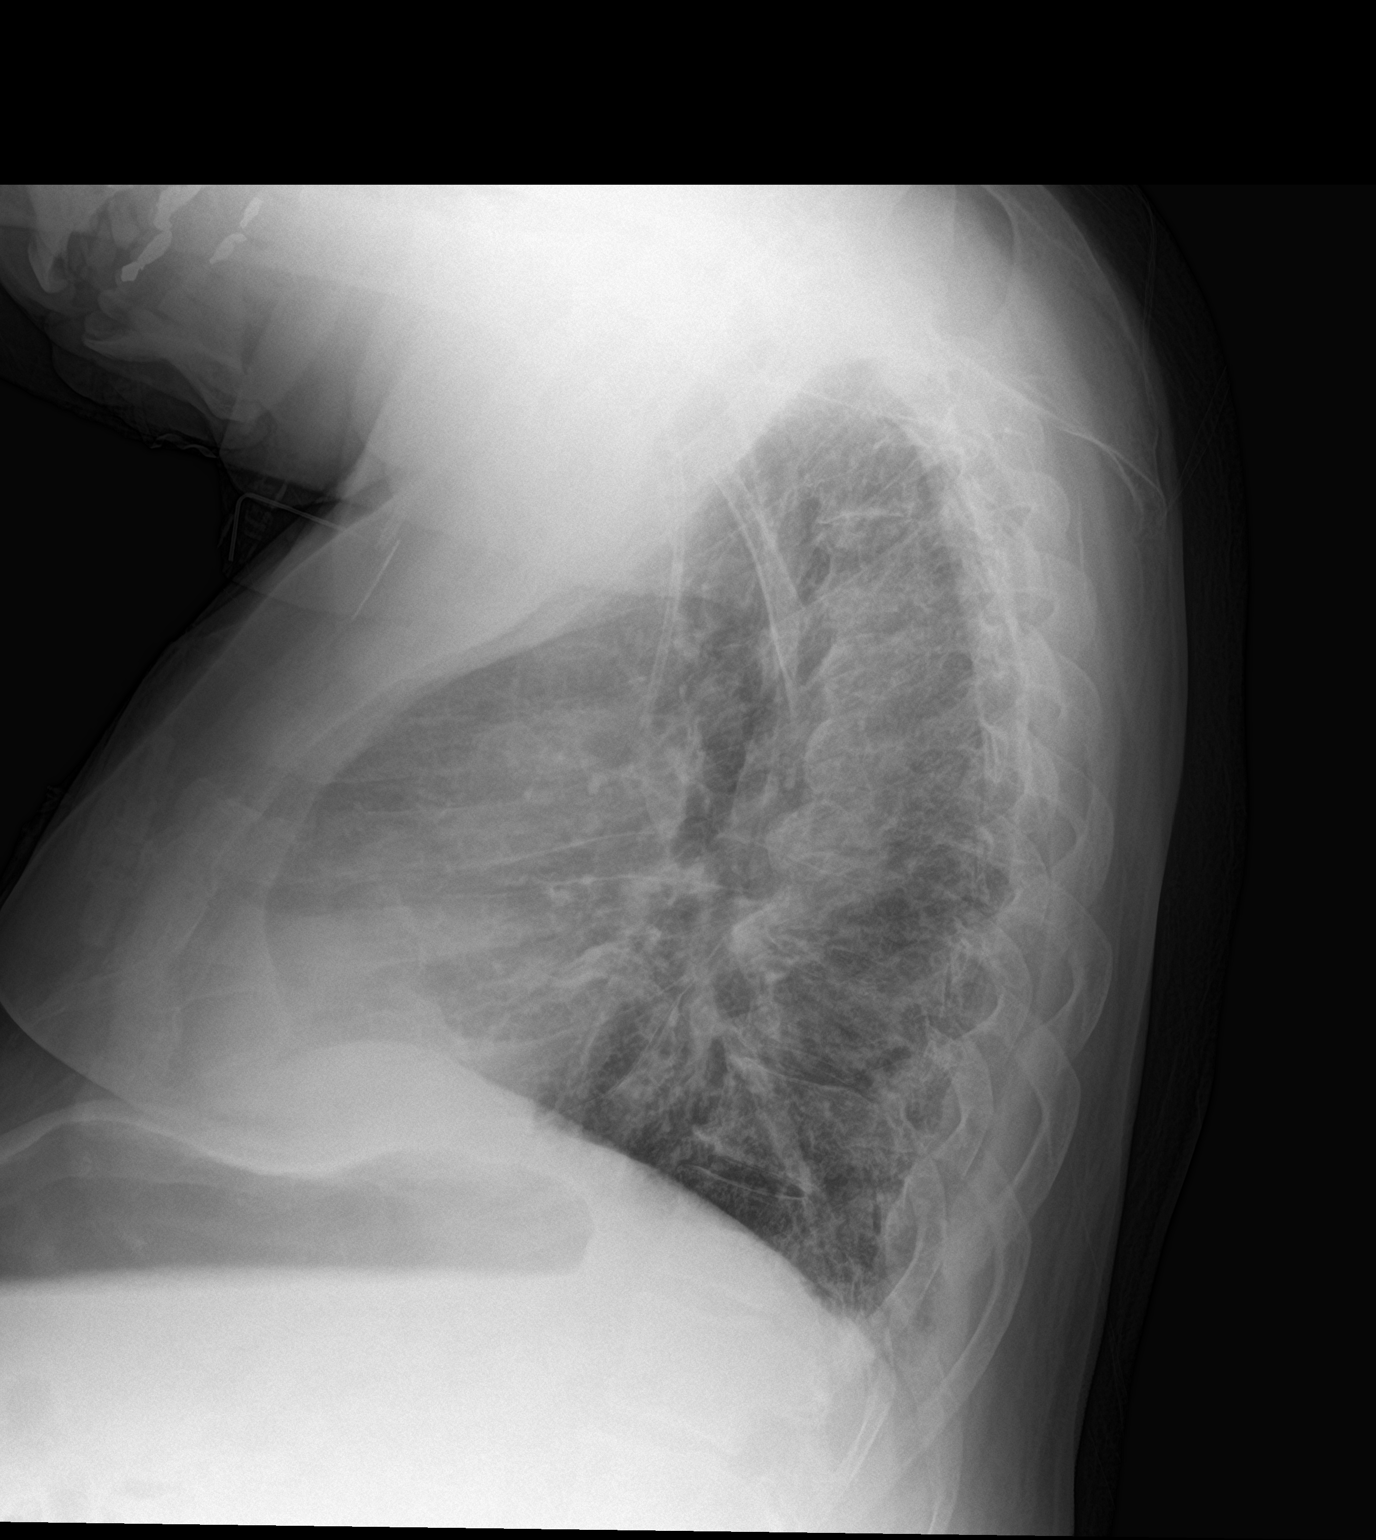

[2 of 2 positions shown; findings below may reference images not displayed]

FINDINGS: RIGHT jugular Port-A-Cath tip projecting over SVC.

Normal heart size mediastinal contours.

Atelectasis LEFT base.

Minimal scarring RIGHT mid lung likely related to adjacent rib
deformity, unchanged.

No additional infiltrate, pleural effusion or pneumothorax.

Osseous demineralization with old BILATERAL rib fractures and
thoracolumbar scoliosis.
IMPRESSION: LEFT basilar atelectasis and RIGHT mid lung scarring.
